# Patient Record
Sex: Male | Born: 1950
Health system: Southern US, Community
[De-identification: ages and names within clinical notes are randomized; demographics above are authoritative.]

## PROBLEM LIST (undated history)

## (undated) DIAGNOSIS — R011 Cardiac murmur, unspecified: Secondary | ICD-10-CM

## (undated) DIAGNOSIS — E785 Hyperlipidemia, unspecified: Secondary | ICD-10-CM

## (undated) DIAGNOSIS — C61 Malignant neoplasm of prostate: Secondary | ICD-10-CM

## (undated) DIAGNOSIS — I499 Cardiac arrhythmia, unspecified: Secondary | ICD-10-CM

## (undated) DIAGNOSIS — K222 Esophageal obstruction: Secondary | ICD-10-CM

## (undated) DIAGNOSIS — K219 Gastro-esophageal reflux disease without esophagitis: Secondary | ICD-10-CM

## (undated) DIAGNOSIS — I639 Cerebral infarction, unspecified: Secondary | ICD-10-CM

## (undated) DIAGNOSIS — F17200 Nicotine dependence, unspecified, uncomplicated: Secondary | ICD-10-CM

## (undated) DIAGNOSIS — R911 Solitary pulmonary nodule: Secondary | ICD-10-CM

## (undated) DIAGNOSIS — Z951 Presence of aortocoronary bypass graft: Secondary | ICD-10-CM

## (undated) DIAGNOSIS — L989 Disorder of the skin and subcutaneous tissue, unspecified: Secondary | ICD-10-CM

## (undated) DIAGNOSIS — Z950 Presence of cardiac pacemaker: Secondary | ICD-10-CM

## (undated) DIAGNOSIS — C44611 Basal cell carcinoma of skin of unspecified upper limb, including shoulder: Secondary | ICD-10-CM

## (undated) DIAGNOSIS — I77810 Thoracic aortic ectasia: Secondary | ICD-10-CM

## (undated) DIAGNOSIS — I2510 Atherosclerotic heart disease of native coronary artery without angina pectoris: Secondary | ICD-10-CM

## (undated) DIAGNOSIS — R001 Bradycardia, unspecified: Secondary | ICD-10-CM

## (undated) DIAGNOSIS — E669 Obesity, unspecified: Secondary | ICD-10-CM

## (undated) DIAGNOSIS — Z8774 Personal history of (corrected) congenital malformations of heart and circulatory system: Secondary | ICD-10-CM

## (undated) DIAGNOSIS — Z953 Presence of xenogenic heart valve: Secondary | ICD-10-CM

## (undated) HISTORY — DX: Basal cell carcinoma of skin of unspecified upper limb, including shoulder: C44.611

## (undated) HISTORY — DX: Atherosclerotic heart disease of native coronary artery without angina pectoris: I25.10

## (undated) HISTORY — DX: Obesity, unspecified: E66.9

## (undated) HISTORY — DX: Esophageal obstruction: K22.2

## (undated) HISTORY — DX: Hyperlipidemia, unspecified: E78.5

## (undated) HISTORY — DX: Nicotine dependence, unspecified, uncomplicated: F17.200

## (undated) HISTORY — DX: Bradycardia, unspecified: R00.1

## (undated) HISTORY — DX: Thoracic aortic ectasia: I77.810

## (undated) HISTORY — DX: Presence of cardiac pacemaker: Z95.0

## (undated) HISTORY — PX: COLONOSCOPY: SHX174

---
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
## 1974-02-08 HISTORY — PX: THYMECTOMY: SHX1063

## 2002-08-06 ENCOUNTER — Ambulatory Visit (HOSPITAL_COMMUNITY): Admission: RE | Admit: 2002-08-06 | Discharge: 2002-08-06 | Payer: Self-pay | Admitting: Gastroenterology

## 2002-09-05 ENCOUNTER — Ambulatory Visit (HOSPITAL_COMMUNITY): Admission: RE | Admit: 2002-09-05 | Discharge: 2002-09-05 | Payer: Self-pay | Admitting: Gastroenterology

## 2007-02-22 ENCOUNTER — Ambulatory Visit: Payer: Self-pay | Admitting: Family Medicine

## 2008-03-04 ENCOUNTER — Ambulatory Visit: Payer: Self-pay | Admitting: Family Medicine

## 2008-03-08 ENCOUNTER — Ambulatory Visit: Payer: Self-pay | Admitting: Family Medicine

## 2008-03-18 ENCOUNTER — Ambulatory Visit: Payer: Self-pay | Admitting: Family Medicine

## 2009-07-28 ENCOUNTER — Ambulatory Visit: Payer: Self-pay | Admitting: Family Medicine

## 2010-06-26 NOTE — Op Note (Signed)
   [name], [name]                            ACCOUNT NO.:  [id number]   MEDICAL RECORD NO.:  [id number]                   PATIENT TYPE:  AMB   LOCATION:  ENDO                                 FACILITY:  [facility]   PHYSICIAN:  John C. Madilyn Fireman, M.D.                 DATE OF BIRTH:  30-Jul-1950   DATE OF PROCEDURE:  08/06/2002  DATE OF DISCHARGE:                                 OPERATIVE REPORT   PROCEDURE PERFORMED:  Colonoscopy.   INDICATIONS FOR PROCEDURE:  Family history of colon cancer in first degree  relative.   DESCRIPTION OF PROCEDURE:  The patient was placed in the left lateral  decubitus position and placed on the pulse monitor with continuous low flow  oxygen delivered by nasal cannula.  He was sedated with 25 mg of IV fentanyl  and 3 mg of IV Versed in addition to the medicine given for the previous  EGD.  The Olympus video colonoscope was inserted into the rectum and  advanced to the cecum, confirmed by transillumination of McBurney's point  and visualization of the ileocecal valve and appendiceal orifice.  The prep  was excellent.  The cecum, ascending, transverse, descending and sigmoid  colon all appeared normal with no masses, polyps, diverticula or other  mucosal abnormalities.  The rectum likewise appeared normal and a  retroflexed view of the anus revealed no obvious internal hemorrhoids.  The  scope was then withdrawn and the patient returned to the recovery room in  stable condition.  He tolerated the procedure well and there were no  immediate complications.   IMPRESSION:  Basically normal colonoscopy.   PLAN:  Repeat colonoscopy in five years based on his family history.                                                John C. Madilyn Fireman, M.D.    JCH/MEDQ  D:  08/06/2002  T:  08/06/2002  Job:  [id number]   cc:   Ricky Ala, M.D.

## 2010-06-26 NOTE — Op Note (Signed)
NAME:  [name], [name]                          ACCOUNT NO.:  [id number]   MEDICAL RECORD NO.:  [id number]                   PATIENT TYPE:  AMB   LOCATION:  ENDO                                 FACILITY:  [facility]   PHYSICIAN:  John C. Madilyn Fireman, M.D.                 DATE OF BIRTH:  Mar 21, 1950   DATE OF PROCEDURE:  09/05/2002  DATE OF DISCHARGE:                                 OPERATIVE REPORT   PROCEDURE:  Esophagogastroduodenoscopy with esophageal dilatation.   INDICATION FOR PROCEDURE:  Recent EGD showing lower esophageal ring or  stricture with significant esophagitis. He presented with dysphagia and felt  that there was too much esophagitis at the time today to perform the  dilation.  He was been treated with proton pump inhibitor for approximately  two months and presents for dilatation.   DESCRIPTION OF PROCEDURE:  The patient was placed in the left lateral  decubitus position and placed on the pulse monitor with continuous low-flow  oxygen delivered by nasal cannula.  He was sedated with 75 mcg IV fentanyl  and 6 mg IV Versed.  The Olympus video endoscope was advanced under direct  vision into the oropharynx and esophagus.  The esophagus was straight and of  normal caliber with the squamocolumnar line imbedded in a patent lower  esophageal ring at approximately 36 cm.  There was a 3 cm hiatal hernia  distal to the ring. There was no visible esophagitis and no evidence of  Barrett's esophagus.  The stomach was entered, and a small amount of liquid  secretions were suctioned from the fundus.  Retroflexed view of the cardia  confirmed the hiatal hernia and was otherwise unremarkable.  The fundus,  body, antrum, and pylorus all appeared normal.  The duodenum was entered,  and both the bulb and second portion were well-inspected and appeared to be  within normal limits.  A Savary guidewire was placed through the endoscope  channel and the scope withdrawn.  Savary dilators of 17 and 18  mm were  passed consecutively with mild resistance over the guidewire and a small  amount of blood seen on the last dilator only.  The last dilator was removed  together with the wire, and the patient returned to the recovery room in  stable condition.  He tolerated the procedure well, and there were no  immediate complications.   IMPRESSION:  A lower esophageal ring with hiatal hernia, status post  dilatation to 18 mm.   PLAN:  1. Advance diet and observe response to dilatation.  2. We will continue proton pump inhibitor.                                               John C. Madilyn Fireman, M.D.    JCH/MEDQ  D:  09/05/2002  T:  09/05/2002  Job:  [id number]   cc:   Sharlot Gowda, M.D.  [place] [address] [place], [place] [address]  Fax: [phone]

## 2010-06-26 NOTE — Op Note (Signed)
   NAMEDAANISH, COPES                            ACCOUNT NO.:  [id number]   MEDICAL RECORD NO.:  [id number]                   PATIENT TYPE:  AMB   LOCATION:  ENDO                                 FACILITY:  [facility]   PHYSICIAN:  John C. Madilyn Fireman, M.D.                 DATE OF BIRTH:  23-Dec-1950   DATE OF PROCEDURE:  DATE OF DISCHARGE:                                 OPERATIVE REPORT   He says to cancel this dictation.                                               John C. Madilyn Fireman, M.D.    JCH/MEDQ  D:  08/06/2002  T:  08/06/2002  Job:  [id number]

## 2010-06-26 NOTE — Op Note (Signed)
[name], [name]                            ACCOUNT NO.:  [id number]   MEDICAL RECORD NO.:  [id number]                   PATIENT TYPE:  AMB   LOCATION:  ENDO                                 FACILITY:  [facility]   PHYSICIAN:  John C. Madilyn Fireman, M.D.                 DATE OF BIRTH:  12-25-1950   DATE OF PROCEDURE:  08/06/2002  DATE OF DISCHARGE:                                 OPERATIVE REPORT   PROCEDURE:  Esophagogastroduodenoscopy.   INDICATIONS FOR PROCEDURE:  Solid food dysphagia in a patient also  undergoing colonoscopy today, due to a family history of colon cancer.   DESCRIPTION OF PROCEDURE:  The patient was placed in the left lateral  decubitus position and placed on the pulse monitor, with continuous low-flow  oxygen delivered by nasal cannula.  He was sedated with 50 mcg IV fentanyl  and  5 mg IV Versed.  The Olympus video endoscope was advanced under direct  vision into the oropharynx and esophagus.  The esophagus was straight and of  normal caliber, with the squamocolumnar line very edematous and imbedded  with a friable stricture.  This was bleeding and the scope is advanced into  it.  There appeared to be several erosions extending upwards from the Z-line  and the stricture was narrow enough that passing the scope through it  induced bleeding.  There were no ulcers seen.  No obvious Barrett's  esophagus.  There was a 3 cm hiatal hernia distal to the stricture.   The stomach was entered and a small amount of liquid secretions were  suctioned from the fundus.  Retroflexed view of the cardia confirmed hiatal hernia, but was otherwise  unremarkable.  The fundus body, antrum and pylorus all appeared normal.  The  duodenum was entered and both the bulb and second portion were well  inspected, appeared to be within normal limits.  Due to the friability of  the stricture, I elected not to dilate it at this time.  The scope was then  withdrawn and the patient returned to the  recovery room in stable condition.  He tolerated the procedure well and there were no immediate complications.   IMPRESSION:  Friable distal esophageal stricture with hiatal hernia.   PLAN:  Treat with a double dose of proton pump inhibitor for four to six  weeks, and then repeat EGD with esophageal dilatation electively.                                               John C. Madilyn Fireman, M.D.    JCH/MEDQ  D:  08/06/2002  T:  08/06/2002  Job:  [id number]   cc:   Sharlot Gowda, M.D.  [address] [place], [place] [address]  Fax: [phone]

## 2010-10-20 ENCOUNTER — Encounter: Payer: Self-pay | Admitting: Family Medicine

## 2010-10-23 ENCOUNTER — Encounter: Payer: Self-pay | Admitting: Family Medicine

## 2010-10-23 ENCOUNTER — Ambulatory Visit (INDEPENDENT_AMBULATORY_CARE_PROVIDER_SITE_OTHER): Payer: BC Managed Care – PPO | Admitting: Family Medicine

## 2010-10-23 DIAGNOSIS — E669 Obesity, unspecified: Secondary | ICD-10-CM

## 2010-10-23 DIAGNOSIS — R011 Cardiac murmur, unspecified: Secondary | ICD-10-CM

## 2010-10-23 DIAGNOSIS — K4090 Unilateral inguinal hernia, without obstruction or gangrene, not specified as recurrent: Secondary | ICD-10-CM

## 2010-10-23 DIAGNOSIS — Z800 Family history of malignant neoplasm of digestive organs: Secondary | ICD-10-CM

## 2010-10-23 DIAGNOSIS — E785 Hyperlipidemia, unspecified: Secondary | ICD-10-CM

## 2010-10-23 DIAGNOSIS — Z0000 Encounter for general adult medical examination without abnormal findings: Secondary | ICD-10-CM

## 2010-10-23 DIAGNOSIS — Z2911 Encounter for prophylactic immunotherapy for respiratory syncytial virus (RSV): Secondary | ICD-10-CM

## 2010-10-23 DIAGNOSIS — Z23 Encounter for immunization: Secondary | ICD-10-CM

## 2010-10-23 DIAGNOSIS — Z79899 Other long term (current) drug therapy: Secondary | ICD-10-CM

## 2010-10-23 LAB — LIPID PANEL
Cholesterol: 202 mg/dL — ABNORMAL HIGH (ref 0–200)
Total CHOL/HDL Ratio: 3.9 Ratio
Triglycerides: 97 mg/dL (ref <150)
VLDL: 19 mg/dL (ref 0–40)

## 2010-10-23 LAB — POCT URINALYSIS DIPSTICK
Bilirubin, UA: NEGATIVE
Ketones, UA: NEGATIVE
Leukocytes, UA: NEGATIVE
Spec Grav, UA: 1.025

## 2010-10-23 LAB — COMPREHENSIVE METABOLIC PANEL
CO2: 20 mEq/L (ref 19–32)
Creat: 0.89 mg/dL (ref 0.50–1.35)
Glucose, Bld: 85 mg/dL (ref 70–99)
Total Bilirubin: 0.5 mg/dL (ref 0.3–1.2)
Total Protein: 7 g/dL (ref 6.0–8.3)

## 2010-10-23 NOTE — Progress Notes (Signed)
Subjective:    Patient ID: [name], male    DOB: Sep 20, 1950, 60 y.o.   MRN: [id number]  HPI A complete examination. He does have reflux disease and takes Prilosec every 3 or 4 days. There is a family history of colon cancer and he had a colonoscopy in 2010. He intermittently takes vitamins. He continues to smoke. He has had no trouble swallowing. He has noted swelling in the left inguinal area. His work and marriage are going well. Family and social history were reviewed.   Review of Systems  Constitutional: Negative.   HENT: Negative.   Eyes: Negative.   Respiratory: Negative.   Cardiovascular: Negative.   Gastrointestinal: Negative.   Genitourinary: Negative.   Musculoskeletal: Negative.   Skin: Negative.   Neurological: Negative.   Hematological: Negative.   Psychiatric/Behavioral: Negative.        Objective:   Physical Exam BP 138/82  Pulse 88  Ht 5\' 8"  (1.727 m)  Wt 233 lb (105.688 kg)  BMI 35.43 kg/m2  General Appearance:    Alert, cooperative, no distress, appears stated age  Head:    Normocephalic, without obvious abnormality, atraumatic  Eyes:    PERRL, conjunctiva/corneas clear, EOM's intact, fundi    benign  Ears:    Normal TM's and external ear canals  Nose:   Nares normal, mucosa normal, no drainage or sinus   tenderness  Throat:   Lips, mucosa, and tongue normal; teeth and gums normal  Neck:   Supple, no lymphadenopathy;  thyroid:  no   enlargement/tenderness/nodules; no carotid   bruit or JVD  Back:    Spine nontender, no curvature, ROM normal, no CVA     tenderness  Lungs:     Clear to auscultation bilaterally without wheezes, rales or     ronchi; respirations unlabored  Chest Wall:    No tenderness or deformity   Heart:    Regular rate and rhythm, S1 and S2 normal, 2/6 systolic murmur heard best along the right sternal border,no  rub   or gallop  Breast Exam:    No chest wall tenderness, masses or gynecomastia  Abdomen:     Soft, non-tender,  nondistended, normoactive bowel sounds,    no masses, no hepatosplenomegaly  Genitalia:    Normal male external genitalia without lesions.  Testicles without masses.  Large inguinal hernia noted on the left       Extremities:   No clubbing, cyanosis or edema  Pulses:   2+ and symmetric all extremities  Skin:   Skin color, texture, turgor normal, no rashes or lesions  Lymph nodes:   Cervical, supraclavicular, and axillary nodes normal  Neurologic:   CNII-XII intact, normal strength, sensation and gait; reflexes 2+ and symmetric throughout          Psych:   Normal mood, affect, hygiene and grooming.          Assessment & Plan:   1. Encounter for long-term (current) use of other medications  POCT Urinalysis Dipstick, CBC w/Diff, Comprehensive metabolic panel, Lipid panel  2. Need for influenza vaccination    3. Left inguinal hernia  Ambulatory referral to General Surgery  4. Obesity (BMI 30-39.9)    5. Family history of colon cancer    6. Hyperlipidemia LDL goal < 100    7. Heart murmur previously undiagnosed  2D Echocardiogram without contrast  8. Routine general medical examination at a health care facility  CBC w/Diff, Comprehensive metabolic panel, Lipid panel   Increasing  to continue on working on smoking cessation. I will refer to Gen. surgery for the hernia and get an echocardiogram.

## 2010-10-24 LAB — CBC WITH DIFFERENTIAL/PLATELET
Basophils Absolute: 0 10*3/uL (ref 0.0–0.1)
Eosinophils Relative: 2 % (ref 0–5)
HCT: 44.1 % (ref 39.0–52.0)
Hemoglobin: 14.8 g/dL (ref 13.0–17.0)
Lymphocytes Relative: 22 % (ref 12–46)
Lymphs Abs: 1.7 10*3/uL (ref 0.7–4.0)
MCV: 88.6 fL (ref 78.0–100.0)
Monocytes Absolute: 0.8 10*3/uL (ref 0.1–1.0)
Monocytes Relative: 10 % (ref 3–12)
Neutro Abs: 5 10*3/uL (ref 1.7–7.7)
RBC: 4.98 MIL/uL (ref 4.22–5.81)
WBC: 7.7 10*3/uL (ref 4.0–10.5)

## 2010-10-26 ENCOUNTER — Telehealth: Payer: Self-pay

## 2010-10-26 NOTE — Telephone Encounter (Signed)
Called to inform pt labs look good lkeft message

## 2010-11-04 ENCOUNTER — Encounter (INDEPENDENT_AMBULATORY_CARE_PROVIDER_SITE_OTHER): Payer: Self-pay | Admitting: Surgery

## 2010-11-04 ENCOUNTER — Ambulatory Visit (INDEPENDENT_AMBULATORY_CARE_PROVIDER_SITE_OTHER): Payer: BC Managed Care – PPO | Admitting: Surgery

## 2010-11-04 VITALS — BP 128/88 | HR 92 | Temp 98.0°F | Resp 12 | Ht 70.0 in | Wt 232.6 lb

## 2010-11-04 DIAGNOSIS — K4020 Bilateral inguinal hernia, without obstruction or gangrene, not specified as recurrent: Secondary | ICD-10-CM | POA: Insufficient documentation

## 2010-11-04 NOTE — Progress Notes (Signed)
Chief Complaint  Patient presents with  . Other    np- eval [facility]    HPI [name] is a 60 y.o. male who presents with several years of some discomfort in his left groin. This tends to occur with prolonged standing with work. About one month ago he started noticing a bulge in his left groin. This has become larger and more uncomfortable. He continues to have daily bowel movements but has noticed that his bowel movements are slightly smaller. The discomfort radiates down to his left scrotum. He denies any symptoms on the right. HPI  Past Medical History  Diagnosis Date  . Smoker   . Obesity   . Dyslipidemia   . Esophageal stricture   . BCE (basal cell epithelioma), arm     RIGHT SHOULDER  . Hyperlipidemia     Past Surgical History  Procedure Date  . Thymectomy 1976  via L thoracotomy  Family History  Problem Relation Age of Onset  . Cancer Mother 70    colon  . Cancer Father 80    lung    Social History History  Substance Use Topics  . Smoking status: Current Everyday Smoker -- 0.5 packs/day for 30 years    Types: Cigarettes  . Smokeless tobacco: Never Used  . Alcohol Use: 0.6 oz/week    1 Cans of beer per week    No Known Allergies  Current Outpatient Prescriptions  Medication Sig Dispense Refill  . cholecalciferol (VITAMIN D) 1000 UNITS tablet Take 1,000 Units by mouth daily.        Marland Kitchen omeprazole (PRILOSEC) 10 MG capsule Take 10 mg by mouth daily.          Review of Systems Review of Systems ROS positive for nasal congestion, slight change in bowel movements. Blood pressure 128/88, pulse 92, temperature 98 F (36.7 C), temperature source Temporal, resp. rate 12, height 5\' 10"  (1.778 m), weight 232 lb 9.6 oz (105.507 kg).  Physical Exam Physical Exam WDWN in NAD HEENT:  EOMI, sclera anicteric Neck:  No masses, no thyromegaly Lungs:  CTA bilaterally; normal respiratory effort CV:  Regular rate and rhythm; no murmurs Abd:  +bowel sounds, soft,  non-tender, no masses GU:  Abundant suprapubic adipose tissue; visible bulge in left groin - enlarges with Valsalva, partially reducible; right groin - spontaneously reducible hernia Ext:  Well-perfused; no edema Skin:  Warm, dry; no sign of jaundice  Data Reviewed None  Assessment    Bilateral inguinal hernias - partially reducible on left; reducible on right    Plan    Open bilateral inguinal hernia repairs with mesh.   I described the procedure in detail.  The patient was given educational material. We discussed the risks and benefits including but not limited to bleeding, infection, chronic inguinal pain, nerve entrapment, hernia recurrence, mesh complications, hematoma formation, urinary retention, injury to the testicles, numbness in the groin, blood clots, injury to the surrounding structures, and anesthesia risk. We also discussed the typical post operative recovery course, including no heavy lifting for 6 weeks.     Tkeyah Burkman K. 11/04/2010, 3:36 PM

## 2010-11-04 NOTE — Patient Instructions (Signed)
We will schedule your surgery today. 

## 2010-12-11 DIAGNOSIS — K4020 Bilateral inguinal hernia, without obstruction or gangrene, not specified as recurrent: Secondary | ICD-10-CM

## 2010-12-11 HISTORY — PX: HERNIA REPAIR: SHX51

## 2010-12-15 ENCOUNTER — Encounter (INDEPENDENT_AMBULATORY_CARE_PROVIDER_SITE_OTHER): Payer: Self-pay | Admitting: Surgery

## 2010-12-25 ENCOUNTER — Ambulatory Visit (INDEPENDENT_AMBULATORY_CARE_PROVIDER_SITE_OTHER): Payer: BC Managed Care – PPO | Admitting: Surgery

## 2010-12-25 ENCOUNTER — Encounter (INDEPENDENT_AMBULATORY_CARE_PROVIDER_SITE_OTHER): Payer: Self-pay | Admitting: Surgery

## 2010-12-25 VITALS — BP 128/86 | HR 88 | Temp 98.2°F | Resp 16 | Ht 70.0 in | Wt 236.2 lb

## 2010-12-25 DIAGNOSIS — K4020 Bilateral inguinal hernia, without obstruction or gangrene, not specified as recurrent: Secondary | ICD-10-CM

## 2010-12-25 NOTE — Patient Instructions (Signed)
Slowly increase your level of activity over the next couple of weeks.  You may resume full activity in mid-December.

## 2010-12-25 NOTE — Progress Notes (Signed)
S/p Open bilateral inguinal hernia repairs with mesh on November 2. He had bilateral pantaloon hernias repaired with mesh. He is doing quite well. Minimal swelling. His incisions are healed no sign of infection. No sign of recurrent hernia. No hematoma or seroma. He may slowly begin increasing his level of activity and may resume full activity in mid-December. Followup p.r.n.

## 2011-12-17 ENCOUNTER — Encounter: Payer: Self-pay | Admitting: Internal Medicine

## 2011-12-27 ENCOUNTER — Ambulatory Visit (INDEPENDENT_AMBULATORY_CARE_PROVIDER_SITE_OTHER): Payer: BC Managed Care – PPO | Admitting: Family Medicine

## 2011-12-27 ENCOUNTER — Encounter: Payer: Self-pay | Admitting: Family Medicine

## 2011-12-27 VITALS — BP 126/80 | HR 86 | Ht 68.75 in | Wt 232.0 lb

## 2011-12-27 DIAGNOSIS — Z0000 Encounter for general adult medical examination without abnormal findings: Secondary | ICD-10-CM

## 2011-12-27 DIAGNOSIS — Z23 Encounter for immunization: Secondary | ICD-10-CM

## 2011-12-27 DIAGNOSIS — Z720 Tobacco use: Secondary | ICD-10-CM | POA: Insufficient documentation

## 2011-12-27 DIAGNOSIS — E669 Obesity, unspecified: Secondary | ICD-10-CM | POA: Insufficient documentation

## 2011-12-27 DIAGNOSIS — Z8719 Personal history of other diseases of the digestive system: Secondary | ICD-10-CM

## 2011-12-27 DIAGNOSIS — I350 Nonrheumatic aortic (valve) stenosis: Secondary | ICD-10-CM | POA: Insufficient documentation

## 2011-12-27 DIAGNOSIS — R011 Cardiac murmur, unspecified: Secondary | ICD-10-CM

## 2011-12-27 DIAGNOSIS — F17200 Nicotine dependence, unspecified, uncomplicated: Secondary | ICD-10-CM

## 2011-12-27 LAB — HEMOCCULT GUIAC POC 1CARD (OFFICE)

## 2011-12-27 NOTE — Progress Notes (Signed)
Subjective:    Patient ID: [name], male    DOB: 12-29-1950, 61 y.o.   MRN: [id number]  HPI He is here for complete examination. He does smoke but is not interested in quitting at this time. He does have a previous history of esophageal stricture but is having no difficulty with it at this time. He does drink occasionally. His marriage is going well as is his work. Social and personal history was reviewed. The record indicates he did have a murmur on his last visit and was scheduled for an echocardiogram however he did not get it done.  Review of Systems  Constitutional: Negative.   HENT: Negative.   Eyes: Negative.   Cardiovascular: Negative.   Gastrointestinal: Negative.   Genitourinary: Negative.   Musculoskeletal: Negative.   Neurological: Negative.   Hematological: Negative.        Objective:   Physical Exam BP 126/80  Pulse 86  Ht 5' 8.75" (1.746 m)  Wt 232 lb (105.235 kg)  BMI 34.51 kg/m2  General Appearance:    Alert, cooperative, no distress, appears stated age  Head:    Normocephalic, without obvious abnormality, atraumatic  Eyes:    PERRL, conjunctiva/corneas clear, EOM's intact, fundi    benign  Ears:    Normal TM's and external ear canals  Nose:   Nares normal, mucosa normal, no drainage or sinus   tenderness  Throat:   Lips, mucosa, and tongue normal; teeth and gums normal  Neck:   Supple, no lymphadenopathy;  thyroid:  no   enlargement/tenderness/nodules; no carotid   bruit or JVD  Back:    Spine nontender, no curvature, ROM normal, no CVA     tenderness  Lungs:     Clear to auscultation bilaterally without wheezes, rales or     ronchi; respirations unlabored  Chest Wall:    No tenderness or deformity   Heart:    Regular rate and rhythm, S1 and S2 normal, 3/6 SEM heard best in the aortic area, no rub   or gallop.  Breast Exam:    No chest wall tenderness, masses or gynecomastia  Abdomen:     Soft, non-tender, nondistended, normoactive bowel sounds,   no masses, no hepatosplenomegaly  Genitalia:   deferred   Rectal:    Normal sphincter tone, no masses or tenderness; guaiac negative stool.  Prostate smooth, no nodules, not enlarged.  Extremities:   No clubbing, cyanosis or edema  Pulses:   2+ and symmetric all extremities  Skin:   Skin color, texture, turgor normal, no rashes or lesions  Lymph nodes:   Cervical, supraclavicular, and axillary nodes normal  Neurologic:   CNII-XII intact, normal strength, sensation and gait; reflexes 2+ and symmetric throughout          Psych:   Normal mood, affect, hygiene and grooming.    EKG showed no acute changes       Assessment & Plan:   1. Routine general medical examination at a health care facility  Tdap vaccine greater than or equal to 7yo IM, Flu vaccine greater than or equal to 3yo preservative free IM, Hemoccult - 1 Card (office), Lipid panel, CBC with Differential, Comprehensive metabolic panel  2. Current smoker    3. Obesity (BMI 30-39.9)  Lipid panel, CBC with Differential, Comprehensive metabolic panel  4. Systolic murmur  2D Echocardiogram without contrast, PR ELECTROCARDIOGRAM, COMPLETE  5. History of esophageal stricture     he is not interested in quitting smoking at this  time. Also discussed the need for him to make diet and exercise changes. His immunizations were updated. Risks and benefits were discussed.

## 2011-12-28 LAB — COMPREHENSIVE METABOLIC PANEL
ALT: 12 U/L (ref 0–53)
BUN: 15 mg/dL (ref 6–23)
CO2: 29 mEq/L (ref 19–32)
Calcium: 9.5 mg/dL (ref 8.4–10.5)
Chloride: 106 mEq/L (ref 96–112)
Creat: 0.87 mg/dL (ref 0.50–1.35)
Glucose, Bld: 92 mg/dL (ref 70–99)
Total Bilirubin: 0.5 mg/dL (ref 0.3–1.2)

## 2011-12-28 LAB — CBC WITH DIFFERENTIAL/PLATELET
Eosinophils Absolute: 0.3 10*3/uL (ref 0.0–0.7)
Eosinophils Relative: 4 % (ref 0–5)
HCT: 45.1 % (ref 39.0–52.0)
Lymphs Abs: 1.6 10*3/uL (ref 0.7–4.0)
MCH: 29.2 pg (ref 26.0–34.0)
MCV: 87.9 fL (ref 78.0–100.0)
Monocytes Absolute: 0.6 10*3/uL (ref 0.1–1.0)
Platelets: 219 10*3/uL (ref 150–400)
RBC: 5.13 MIL/uL (ref 4.22–5.81)

## 2011-12-28 LAB — LIPID PANEL
Cholesterol: 217 mg/dL — ABNORMAL HIGH (ref 0–200)
Total CHOL/HDL Ratio: 3.8 Ratio

## 2011-12-28 NOTE — Progress Notes (Signed)
Quick Note:  Called pt cell # pt advised blood work unchanged and he verbalized understanding ______

## 2012-01-21 ENCOUNTER — Telehealth: Payer: Self-pay | Admitting: Family Medicine

## 2012-01-21 NOTE — Telephone Encounter (Signed)
SENT TO [facility]

## 2012-01-21 NOTE — Telephone Encounter (Signed)
CALLED PT HOME LEFT MESSAGE THAT IT WAS MY FAULT AND I APPOLIGIZE FOR THE INCONVENIENCE AND FAXED REQUEST TO [facility].

## 2012-01-25 ENCOUNTER — Other Ambulatory Visit (HOSPITAL_COMMUNITY): Payer: Self-pay | Admitting: Family Medicine

## 2012-01-25 DIAGNOSIS — R011 Cardiac murmur, unspecified: Secondary | ICD-10-CM

## 2012-02-07 ENCOUNTER — Ambulatory Visit (HOSPITAL_COMMUNITY)
Admission: RE | Admit: 2012-02-07 | Discharge: 2012-02-07 | Disposition: A | Discharge status: Home / Self Care | Payer: BC Managed Care – PPO | Source: Ambulatory Visit | Attending: Cardiovascular Disease | Admitting: Cardiovascular Disease

## 2012-02-07 DIAGNOSIS — R011 Cardiac murmur, unspecified: Secondary | ICD-10-CM | POA: Insufficient documentation

## 2012-02-07 DIAGNOSIS — F17200 Nicotine dependence, unspecified, uncomplicated: Secondary | ICD-10-CM | POA: Insufficient documentation

## 2012-02-07 DIAGNOSIS — E669 Obesity, unspecified: Secondary | ICD-10-CM | POA: Insufficient documentation

## 2012-02-07 DIAGNOSIS — I080 Rheumatic disorders of both mitral and aortic valves: Secondary | ICD-10-CM | POA: Insufficient documentation

## 2012-02-07 NOTE — Progress Notes (Signed)
[facility]   2D echo completed 02/07/2012.   Cindy Anise Harbin, RDCS   

## 2012-02-10 NOTE — Progress Notes (Signed)
Quick Note:  I discussed the results with the patient. Appt with Dr Patty Sermons. ______

## 2012-02-11 ENCOUNTER — Telehealth: Payer: Self-pay

## 2012-02-11 NOTE — Telephone Encounter (Signed)
PT HAS BEEN CALLED AND INFORMED OF HIS APT WITH [facility] Monday JAN. 6TH WITH DR [facility] [place] AT 8:45 am [phone]

## 2012-02-14 ENCOUNTER — Encounter: Payer: Self-pay | Admitting: Cardiovascular Disease

## 2012-02-14 ENCOUNTER — Ambulatory Visit (INDEPENDENT_AMBULATORY_CARE_PROVIDER_SITE_OTHER): Payer: Managed Care, Other (non HMO) | Admitting: Cardiovascular Disease

## 2012-02-14 VITALS — BP 122/74 | Ht 69.0 in | Wt 241.0 lb

## 2012-02-14 DIAGNOSIS — I350 Nonrheumatic aortic (valve) stenosis: Secondary | ICD-10-CM

## 2012-02-14 DIAGNOSIS — I359 Nonrheumatic aortic valve disorder, unspecified: Secondary | ICD-10-CM

## 2012-02-14 NOTE — Patient Instructions (Addendum)
Your physician wants you to follow-up in:  6 months.  You will receive a reminder letter in the mail two months in advance. If you don't receive a letter, please call our office to schedule the follow-up appointment.  Your physician has requested that you have an echocardiogram. Echocardiography is a painless test that uses sound waves to create images of your heart. It provides your doctor with information about the size and shape of your heart and how well your heart's chambers and valves are working. This procedure takes approximately one hour. There are no restrictions for this procedure. To be done in 6 months--week or so prior to appt with Dr. McAlhany    

## 2012-02-14 NOTE — Progress Notes (Signed)
History of Present Illness: 62 yo male with history of tobacco abuse, hyperlipidemia, esophageal stricture referred today for evaluation after an abnormal echo. He has been noted in primary care to have a systolic murmur. Echo 01/3012 with moderate aortic valve stenosis and regurgitation and mild to moderate mitral valve stenosis, severe MAC and enlarged left atrium.    He is feeling well. He has no chest pain or SOB. He is trying to stop smoking. No lower extremity edema, orthopnea, PND. Weight is stable.   Primary Care Physician: Sharlot Gowda  Last Lipid Profile:Lipid Panel     Component Value Date/Time   CHOL 217* 12/27/2011 1538   TRIG 99 12/27/2011 1538   HDL 57 12/27/2011 1538   CHOLHDL 3.8 12/27/2011 1538   VLDL 20 12/27/2011 1538   LDLCALC 140* 12/27/2011 1538     Past Medical History  Diagnosis Date  . Smoker   . Obesity   . Dyslipidemia   . Esophageal stricture   . BCE (basal cell epithelioma), arm     RIGHT SHOULDER  . Hyperlipidemia   . Inguinal hernia     bilateral    Past Surgical History  Procedure Date  . Thymectomy 1976  . Hernia repair 12/11/10    BIH  . Colonoscopy 03/2008,2004    Dr. Madilyn Fireman    Current Outpatient Prescriptions  Medication Sig Dispense Refill  . omeprazole (PRILOSEC) 10 MG capsule Take 10 mg by mouth daily. Pt takes one every three days        No Known Allergies  History   Social History  . Marital Status: Married    Spouse Name: N/A    Number of Children: 0  . Years of Education: N/A   Occupational History  . Photographer    Social History Main Topics  . Smoking status: Current Every Day Smoker -- 0.2 packs/day for 35 years    Types: Cigarettes  . Smokeless tobacco: Never Used  . Alcohol Use: 0.6 oz/week    1 Cans of beer per week  . Drug Use: No  . Sexually Active: Yes   Other Topics Concern  . Not on file   Social History Narrative  . No narrative on file    Family History  Problem Relation Age of Onset   . Cancer Mother 7    colon  . Cancer Father 75    lung  . CAD Neg Hx     Review of Systems:  As stated in the HPI and otherwise negative.   BP 122/74  Ht 5\' 9"  (1.753 m)  Wt 241 lb (109.317 kg)  BMI 35.59 kg/m2  Physical Examination: General: Well developed, well nourished, NAD HEENT: OP clear, mucus membranes moist SKIN: warm, dry. No rashes. Neuro: No focal deficits Musculoskeletal: Muscle strength 5/5 all ext Psychiatric: Mood and affect normal Neck: No JVD, no carotid bruits, no thyromegaly, no lymphadenopathy. Lungs:Clear bilaterally, no wheezes, rhonci, crackles Cardiovascular: Regular rate and rhythm. II/VI systolic murmur without gallops or rubs. I cannot appreciate a diastolic murmur.  Abdomen:Soft. Bowel sounds present. Non-tender.  Extremities: No lower extremity edema. Pulses are 1 + in the bilateral DP/PT.  EKG: NSR, rate 99 bpm. PVC. Incomplete RBBB. LAE  Echo 02/07/12: Left ventricle: The cavity size was normal. There was mild concentric hypertrophy. Systolic function was normal. The estimated ejection fraction was in the range of 55% to 60%. Wall motion was normal; there were no regional wall motion abnormalities. Doppler parameters are consistent with elevated mean  left atrial filling pressure. - Aortic valve: There was moderate stenosis. Moderate regurgitation. Valve area: 1.26cm^2(VTI). Valve area: 1.19cm^2 (Vmax). - Mitral valve: Severely calcified annulus. The findings are consistent with mild to moderate stenosis. Valve area by pressure half-time: 2.12cm^2. Valve area by continuity equation (using LVOT flow): 1.56cm^2. - Left atrium: The atrium was severely dilated. - Atrial septum: No defect or patent foramen ovale was identified. - Pulmonary arteries: Systolic pressure was mildly increased. PA peak pressure: 41mm Hg (S).  Assessment and Plan:   1. Aortic stenosis: Moderate by echo with moderate regurgitation 02/07/12. He has no symptoms of  chest pain, SOB, CHF or syncope. Will need repeat echo June 2014.  2. Mitral valve stenosis: Mild to moderate by echo 02/07/12 with severe mitral annular calcification. Repeat echo six months.   3. Tobacco abuse: He is trying to stop.

## 2013-05-14 ENCOUNTER — Encounter: Payer: Self-pay | Admitting: Family Medicine

## 2013-05-18 ENCOUNTER — Encounter: Payer: Self-pay | Admitting: Family Medicine

## 2013-05-18 ENCOUNTER — Ambulatory Visit (INDEPENDENT_AMBULATORY_CARE_PROVIDER_SITE_OTHER): Payer: Managed Care, Other (non HMO) | Admitting: Family Medicine

## 2013-05-18 VITALS — Ht 67.0 in | Wt 236.0 lb

## 2013-05-18 DIAGNOSIS — Z800 Family history of malignant neoplasm of digestive organs: Secondary | ICD-10-CM

## 2013-05-18 DIAGNOSIS — E669 Obesity, unspecified: Secondary | ICD-10-CM

## 2013-05-18 DIAGNOSIS — F17200 Nicotine dependence, unspecified, uncomplicated: Secondary | ICD-10-CM

## 2013-05-18 DIAGNOSIS — Z8719 Personal history of other diseases of the digestive system: Secondary | ICD-10-CM

## 2013-05-18 DIAGNOSIS — K4090 Unilateral inguinal hernia, without obstruction or gangrene, not specified as recurrent: Secondary | ICD-10-CM

## 2013-05-18 DIAGNOSIS — Z0000 Encounter for general adult medical examination without abnormal findings: Secondary | ICD-10-CM

## 2013-05-18 DIAGNOSIS — I350 Nonrheumatic aortic (valve) stenosis: Secondary | ICD-10-CM

## 2013-05-18 DIAGNOSIS — I359 Nonrheumatic aortic valve disorder, unspecified: Secondary | ICD-10-CM

## 2013-05-18 LAB — COMPREHENSIVE METABOLIC PANEL
ALT: 16 U/L (ref 0–53)
AST: 18 U/L (ref 0–37)
Albumin: 4.2 g/dL (ref 3.5–5.2)
Alkaline Phosphatase: 57 U/L (ref 39–117)
BILIRUBIN TOTAL: 0.7 mg/dL (ref 0.2–1.2)
BUN: 13 mg/dL (ref 6–23)
CO2: 24 meq/L (ref 19–32)
CREATININE: 0.87 mg/dL (ref 0.50–1.35)
Calcium: 9.6 mg/dL (ref 8.4–10.5)
Chloride: 104 mEq/L (ref 96–112)
Glucose, Bld: 88 mg/dL (ref 70–99)
Potassium: 4.4 mEq/L (ref 3.5–5.3)
Sodium: 139 mEq/L (ref 135–145)
Total Protein: 7.1 g/dL (ref 6.0–8.3)

## 2013-05-18 LAB — CBC WITH DIFFERENTIAL/PLATELET
Basophils Absolute: 0 10*3/uL (ref 0.0–0.1)
Basophils Relative: 0 % (ref 0–1)
EOS ABS: 0.3 10*3/uL (ref 0.0–0.7)
EOS PCT: 4 % (ref 0–5)
HCT: 41.8 % (ref 39.0–52.0)
Hemoglobin: 14.5 g/dL (ref 13.0–17.0)
LYMPHS ABS: 2 10*3/uL (ref 0.7–4.0)
Lymphocytes Relative: 25 % (ref 12–46)
MCH: 29.1 pg (ref 26.0–34.0)
MCHC: 34.7 g/dL (ref 30.0–36.0)
MCV: 83.9 fL (ref 78.0–100.0)
Monocytes Absolute: 0.6 10*3/uL (ref 0.1–1.0)
Monocytes Relative: 8 % (ref 3–12)
Neutro Abs: 5.1 10*3/uL (ref 1.7–7.7)
Neutrophils Relative %: 63 % (ref 43–77)
PLATELETS: 232 10*3/uL (ref 150–400)
RBC: 4.98 MIL/uL (ref 4.22–5.81)
RDW: 14.3 % (ref 11.5–15.5)
WBC: 8.1 10*3/uL (ref 4.0–10.5)

## 2013-05-18 LAB — POCT URINALYSIS DIPSTICK
Bilirubin, UA: NEGATIVE
Glucose, UA: NEGATIVE
Ketones, UA: NEGATIVE
Leukocytes, UA: NEGATIVE
Nitrite, UA: NEGATIVE
PH UA: 7
PROTEIN UA: NEGATIVE
RBC UA: NEGATIVE
UROBILINOGEN UA: NEGATIVE

## 2013-05-18 LAB — LIPID PANEL
CHOL/HDL RATIO: 4 ratio
Cholesterol: 202 mg/dL — ABNORMAL HIGH (ref 0–200)
HDL: 50 mg/dL (ref >39)
LDL CALC: 130 mg/dL — AB (ref 0–99)
Triglycerides: 111 mg/dL (ref <150)
VLDL: 22 mg/dL (ref 0–40)

## 2013-05-18 NOTE — Progress Notes (Signed)
   Subjective:    Patient ID: [name], male    DOB: 07/25/1950, 63 y.o.   MRN: [id number]  HPI He is here for complete examination. He has had some difficulty with swelling in the inguinal area. He has had previous surgery in that area and is concerned over another hernia. He is working on quitting smoking but has not done quite yet. He does have a history of esophageal stricture but has not had any difficulty with swallowing. He will be scheduled for colonoscopy in the near future. He also is not seen his cardiologist for evaluation of his underlying aortic stenosis. Otherwise he has no particular concerns or complaints. Home life is going well. He is now working 3 days per week and seems to be enjoying this.   Review of Systems  All other systems reviewed and are negative.      Objective:   Physical Exam Ht 5\' 7"  (1.702 m)  Wt 236 lb (107.049 kg)  BMI 36.95 kg/m2  General Appearance:    Alert, cooperative, no distress, appears stated age  Head:    Normocephalic, without obvious abnormality, atraumatic  Eyes:    PERRL, conjunctiva/corneas clear, EOM's intact, fundi    benign  Ears:    Normal TM 's and external ear canals  Nose:   Nares normal, mucosa normal, no drainage or sinus   tenderness  Throat:   Lips, mucosa, and tongue normal; teeth and gums normal  Neck:   Supple, no lymphadenopathy;  thyroid:  no   enlargement/tenderness/nodules; no carotid   bruit or JVD  Back:    Spine nontender, no curvature, ROM normal, no CVA     tenderness  Lungs:     Clear to auscultation bilaterally without wheezes, rales or     ronchi; respirations unlabored  Chest Wall:    No tenderness or deformity   Heart:    Regular rate and rhythm, S1 and S2 normal, no murmur, rub   or gallop  Breast Exam:    No chest wall tenderness, masses or gynecomastia  Abdomen:     Soft, non-tender, nondistended, normoactive bowel sounds,    no masses, no hepatosplenomegaly  Genitalia:    Normal male external  genitalia without lesions.  Testicles without masses.  Right inguinal hernia noted.   Rectal:   deferred   Extremities:   No clubbing, cyanosis or edema  Pulses:   2+ and symmetric all extremities  Skin:   Skin color, texture, turgor normal, no rashes or lesions  Lymph nodes:   Cervical, supraclavicular, and axillary nodes normal  Neurologic:   CNII-XII intact, normal strength, sensation and gait; reflexes 2+ and symmetric throughout          Psych:   Normal mood, affect, hygiene and grooming.         Assessment & Plan:  Routine general medical examination at a health care facility - Plan: Urinalysis Dipstick, CBC with Differential, Comprehensive metabolic panel, Lipid panel  Current smoker  Obesity (BMI 30-39.9) - Plan: CBC with Differential, Comprehensive metabolic panel, Lipid panel  Family history of colon cancer  History of esophageal stricture  Right inguinal hernia  Aortic stenosis  he'll schedule to see his cardiologist again. Discussed treatment of the hernia and he will hold off on any further evaluation and treatment of that. Encouraged him to quit smoking. Followup here as needed.

## 2013-06-14 ENCOUNTER — Encounter: Payer: Self-pay | Admitting: Internal Medicine

## 2013-06-14 ENCOUNTER — Encounter: Payer: Self-pay | Admitting: Family Medicine

## 2013-12-17 ENCOUNTER — Encounter: Payer: Self-pay | Admitting: Family Medicine

## 2013-12-17 ENCOUNTER — Ambulatory Visit (INDEPENDENT_AMBULATORY_CARE_PROVIDER_SITE_OTHER): Payer: Managed Care, Other (non HMO) | Admitting: Family Medicine

## 2013-12-17 VITALS — BP 120/76 | HR 118 | Wt 223.0 lb

## 2013-12-17 DIAGNOSIS — K4021 Bilateral inguinal hernia, without obstruction or gangrene, recurrent: Secondary | ICD-10-CM

## 2013-12-17 DIAGNOSIS — Z23 Encounter for immunization: Secondary | ICD-10-CM

## 2013-12-17 NOTE — Progress Notes (Signed)
   Subjective:    Patient ID: [name], male    DOB: [birth year], 63 y.o.   MRN: [id number]  HPI He is here for evaluation of fullness and discomfort in the left inguinal area. He also has a lesser issue on the right. He has had previous bilateral inguinal surgery.   Review of Systems     Objective:   Physical Exam Large hernia present on the left with a smaller one present on the right. Testes are slightly atrophied.       Assessment & Plan:  Need for prophylactic vaccination and inoculation against influenza - Plan: Flu Vaccine QUAD 36+ mos PF IM (Fluarix Quad PF)  Bilateral recurrent inguinal hernia without obstruction or gangrene - Plan: Ambulatory referral to General Surgery

## 2014-02-06 ENCOUNTER — Ambulatory Visit (INDEPENDENT_AMBULATORY_CARE_PROVIDER_SITE_OTHER): Payer: Self-pay | Admitting: Surgery

## 2014-02-06 NOTE — H&P (Signed)
History of Present Illness [name]. Michael Yott MD; 02/06/2014 5:02 PM) Patient words: f/u hernia.  The patient is a 63 year old male who presents with an inguinal hernia. Referred by Dr. Redmond School for bilateral inguinal hernias  This is a 63 year old male who is status post open bilateral inguinal hernia repairs with mesh in November 2012. The patient had very large bilateral pantaloon inguinal hernias with very little remaining muscle in his inguinal floors. These were both repaired with Prolene mesh. The patient did well for about 2 years. Over the last several months he has developed noticeable swelling and discomfort, mostly on his left side. This gets better when he is relaxed. 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 He denies any obstructive symptoms. Other Problems (Michael Eversole, LPN; 04/54/0981 1:91 PM) Gastroesophageal Reflux Disease Heart murmur Inguinal Hernia  Past Surgical History (Michael Eversole, LPN; 47/82/9562 1:30 PM) Colon Polyp Removal - Colonoscopy Laparoscopic Inguinal Hernia Surgery Bilateral.  Diagnostic Studies History (Michael Eversole, LPN; 86/57/8469 6:29 PM) Colonoscopy within last year  Allergies (Michael Eversole, LPN; 52/84/1324 4:01 PM) No Known Drug Allergies 02/06/2014  Medication History (Michael Eversole, LPN; 02/72/5366 4:40 PM) Aspirin EC (81MG  Tablet DR, Oral) Active. PriLOSEC (10MG  Packet, Oral) Active.  Social History (Michael Eversole, LPN; 34/74/2595 6:38 PM) Alcohol use Moderate alcohol use. Caffeine use Carbonated beverages, Coffee, Tea. No drug use Tobacco use Current some day smoker.  Family History Michael Borer, LPN; 75/64/3329 5:18 PM) Colon Cancer Mother. Respiratory Condition Father.     Review of Systems (Michael Eversole LPN; 84/16/6063 0:16 PM) General Not Present- Appetite Loss, Chills, Fatigue, Fever, Night Sweats, Weight Gain and Weight Loss. Skin Not Present- Change in  Wart/Mole, Dryness, Hives, Jaundice, New Lesions, Non-Healing Wounds, Rash and Ulcer. HEENT Not Present- Earache, Hearing Loss, Hoarseness, Nose Bleed, Oral Ulcers, Ringing in the Ears, Seasonal Allergies, Sinus Pain, Sore Throat, Visual Disturbances, Wears glasses/contact lenses and Yellow Eyes. Respiratory Not Present- Bloody sputum, Chronic Cough, Difficulty Breathing, Snoring and Wheezing. Breast Not Present- Breast Mass, Breast Pain, Nipple Discharge and Skin Changes. Cardiovascular Not Present- Chest Pain, Difficulty Breathing Lying Down, Leg Cramps, Palpitations, Rapid Heart Rate, Shortness of Breath and Swelling of Extremities. Gastrointestinal Not Present- Abdominal Pain, Bloating, Bloody Stool, Change in Bowel Habits, Chronic diarrhea, Constipation, Difficulty Swallowing, Excessive gas, Gets full quickly at meals, Hemorrhoids, Indigestion, Nausea, Rectal Pain and Vomiting. Male Genitourinary Not Present- Blood in Urine, Change in Urinary Stream, Frequency, Impotence, Nocturia, Painful Urination, Urgency and Urine Leakage. Musculoskeletal Not Present- Back Pain, Joint Pain, Joint Stiffness, Muscle Pain, Muscle Weakness and Swelling of Extremities. Neurological Not Present- Decreased Memory, Fainting, Headaches, Numbness, Seizures, Tingling, Tremor, Trouble walking and Weakness. Psychiatric Not Present- Anxiety, Bipolar, Change in Sleep Pattern, Depression, Fearful and Frequent crying. Endocrine Not Present- Cold Intolerance, Excessive Hunger, Hair Changes, Heat Intolerance, Hot flashes and New Diabetes. Hematology Not Present- Easy Bruising, Excessive bleeding, Gland problems, HIV and Persistent Infections.  Vitals (Michael Eversole LPN; 02/16/3233 5:73 PM) 02/06/2014 4:19 PM Weight: 229.6 lb Height: 67in Body Surface Area: 2.22 m Body Mass Index: 35.96 kg/m Temp.: 97.45F(Oral)  Pulse: 82 (Regular)  Resp.: 16 (Unlabored)  BP: 142/80 (Sitting, Left Arm,  Standard)     Physical Exam Michael Key K. Jonnae Fonseca MD; 02/06/2014 5:05 PM)  The physical exam findings are as follows: Note:WDWN in NAD HEENT: EOMI, sclera anicteric Neck: No masses, no thyromegaly Lungs: CTA bilaterally; normal respiratory effort CV: Regular rate and rhythm; no murmurs Abd: +bowel sounds, soft,  non-tender, no masses GU: bilateral inguinal incisions well-healed; bilateral inguinal hernias - partially reducible Ext: Well-perfused; no edema Skin: Warm, dry; no sign of jaundice    Assessment & Plan Michael Key K. Kenetra Hildenbrand MD; 02/06/2014 4:38 PM)  BILATERAL RECURRENT INGUINAL HERNIA WITHOUT OBSTRUCTION OR GANGRENE (550.93  K40.21)  Current Plans Schedule for Surgery - Laparoscopic bilateral inguinal hernia repairs with mesh. The surgical procedure has been discussed with the patient. Potential risks, benefits, alternative treatments, and expected outcomes have been explained. All of the patient's questions at this time have been answered. The likelihood of reaching the patient's treatment goal is good. The patient understand the proposed surgical procedure and wishes to proceed.  [name]. Michael Dover, MD, [facility]  General/ Trauma Surgery  02/06/2014 5:07 PM

## 2014-03-18 ENCOUNTER — Encounter: Payer: Self-pay | Admitting: Physician Assistant

## 2014-03-18 ENCOUNTER — Ambulatory Visit (INDEPENDENT_AMBULATORY_CARE_PROVIDER_SITE_OTHER): Payer: Managed Care, Other (non HMO) | Admitting: Physician Assistant

## 2014-03-18 VITALS — BP 128/82 | HR 82 | Ht 67.0 in | Wt 234.2 lb

## 2014-03-18 DIAGNOSIS — I350 Nonrheumatic aortic (valve) stenosis: Secondary | ICD-10-CM

## 2014-03-18 DIAGNOSIS — I050 Rheumatic mitral stenosis: Secondary | ICD-10-CM

## 2014-03-18 DIAGNOSIS — F17200 Nicotine dependence, unspecified, uncomplicated: Secondary | ICD-10-CM

## 2014-03-18 DIAGNOSIS — Z720 Tobacco use: Secondary | ICD-10-CM

## 2014-03-18 DIAGNOSIS — Z01818 Encounter for other preprocedural examination: Secondary | ICD-10-CM | POA: Insufficient documentation

## 2014-03-18 DIAGNOSIS — E669 Obesity, unspecified: Secondary | ICD-10-CM

## 2014-03-18 NOTE — Assessment & Plan Note (Signed)
Weight loss recommended 

## 2014-03-18 NOTE — Assessment & Plan Note (Signed)
Patient had moderate aortic stenosis and mild to moderate mitral stenosis on 2-D echo in 2013. He was supposed to have a follow-up in June 2014 that was never done. He is now here for cardiac clearance prior to undergoing left hernia repair. He is asymptomatic. We will order a 2-D echo to be performed tomorrow prior to clearing him for surgery. Patient has had no change in his past medical history since his last office visit.

## 2014-03-18 NOTE — Progress Notes (Signed)
Cardiology Office Note   Date:  03/18/2014   ID:  Michael, [name] [birth year], MRN [id number]  PCP:  Wyatt Haste, MD  Cardiologist: Dr. Angelena Form  CC: Surgical clearance    History of Present Illness: [name] is a 64 y.o. male who presents for surgical clearance for left hernia repair by Dr. Georgette Dover.   He has a history of Moderate AS and AI Valve area: 1.26cm^2(VTI). Valve area:1.19cm^2 (Vmax), mild to moderate Mitral stenosis on echo 01/28/2012. He was supposed to have f/u echo 07/2012 but it was never done. He last saw Dr. Angelena Form 02/14/2012. He denies any change in his medical history since then. He is trying to quit smoking and is down to 4 cigarettes/daily. He doesn't exercise regulary, but could walk at least a mile without difficulty. He denies hypertension, diabetes mellitus, hyperlipidemia, or family history of heart disease.    Past Medical History  Diagnosis Date  . Smoker   . Obesity   . Dyslipidemia   . Esophageal stricture   . BCE (basal cell epithelioma), arm     RIGHT SHOULDER  . Hyperlipidemia   . Inguinal hernia     bilateral    Past Surgical History  Procedure Laterality Date  . Thymectomy  1976  . Hernia repair  12/11/10    BIH  . Colonoscopy  03/2008,2004    Dr. Amedeo Plenty     Current Outpatient Prescriptions  Medication Sig Dispense Refill  . aspirin 81 MG tablet Take 81 mg by mouth once. Pt takes every other day    . omeprazole (PRILOSEC) 10 MG capsule Take 10 mg by mouth daily. Pt takes one every three days     No current facility-administered medications for this visit.    Allergies:   Review of patient's allergies indicates no known allergies.    Social History:  The patient  reports that he has been smoking Cigarettes.  He has a 8.75 pack-year smoking history. He has never used smokeless tobacco. He reports that he drinks about 0.6 oz of alcohol per week. He reports that he does not use illicit drugs.   Family History:  The  patient's family history includes Cancer (age of onset: 33) in his father; Cancer (age of onset: 80) in his mother. There is no history of CAD.    ROS:  Please see the history of present illness.    All other systems are reviewed and negative.    PHYSICAL EXAM: VS:  BP 128/82 mmHg  Pulse 82  Ht 5\' 7"  (1.702 m)  Wt 234 lb 3.2 oz (106.232 kg)  BMI 36.67 kg/m2 , BMI Body mass index is 36.67 kg/(m^2). GEN: Obese, in no acute distress HEENT: normal Neck: no JVD, carotid bruits, or masses Cardiac:RRR; 7-2/5 harsh systolic murmur at the left sternal border and apex, no rubs, or gallops,no edema  Respiratory:  clear to auscultation bilaterally, normal work of breathing GI: soft, nontender, nondistended, + BS MS: no deformity or atrophy Skin: warm and dry, no rash Neuro:  Strength and sensation are intact Psych: euthymic mood, full affect   EKG:  EKG  ordered today. The ekg ordered today demonstrates normal sinus rhythm with RSR or QR pattern in V1 no acute change from prior tracing   Recent Labs: 05/18/2013: ALT 16; BUN 13; Creatinine 0.87; Hemoglobin 14.5; Platelets 232; Potassium 4.4; Sodium 139    Lipid Panel    Component Value Date/Time   CHOL 202* 05/18/2013 1437   TRIG 111  05/18/2013 1437   HDL 50 05/18/2013 1437   CHOLHDL 4.0 05/18/2013 1437   VLDL 22 05/18/2013 1437   LDLCALC 130* 05/18/2013 1437      Wt Readings from Last 3 Encounters:  03/18/14 234 lb 3.2 oz (106.232 kg)  12/17/13 223 lb (101.152 kg)  05/18/13 236 lb (107.049 kg)      Other studies Reviewed: Additional studies/ records that were reviewed today include: 2-D echo from 2013 Review of the above records demonstrates: Moderate aortic stenosis and mild to moderate mitral stenosis   Echo 02/07/12: Left ventricle: The cavity size was normal. There was mild concentric hypertrophy. Systolic function was normal. The estimated ejection fraction was in the range of 55% to 60%. Wall motion was normal;  there were no regional wall motion abnormalities. Doppler parameters are consistent with elevated mean left atrial filling pressure. - Aortic valve: There was moderate stenosis. Moderate regurgitation. Valve area: 1.26cm^2(VTI). Valve area: 1.19cm^2 (Vmax). - Mitral valve: Severely calcified annulus. The findings are consistent with mild to moderate stenosis. Valve area by pressure half-time: 2.12cm^2. Valve area by continuity equation (using LVOT flow): 1.56cm^2. - Left atrium: The atrium was severely dilated. - Atrial septum: No defect or patent foramen ovale was identified. - Pulmonary arteries: Systolic pressure was mildly increased. PA peak pressure: 61mm Hg (S).     Sumner Boast, PA-C  03/18/2014 9:01 AM    [facility] [place], [place],   [address] Phone: [phone]; Fax: [phone]

## 2014-03-18 NOTE — Assessment & Plan Note (Signed)
Patient had moderate aortic stenosis and mild to moderate mitral stenosis on 2-D echo in 2013. He was supposed to have a follow-up in June 2014 that was never done. He is now here for cardiac clearance prior to undergoing left hernia repair. He is asymptomatic. We will order a 2-D echo to be performed tomorrow prior to clearing him for surgery.

## 2014-03-18 NOTE — Assessment & Plan Note (Signed)
Patient had history of mild to moderate mitral stenosis on echo in 2013. We'll repeat.

## 2014-03-18 NOTE — Assessment & Plan Note (Signed)
Patient is currently trying to quit smoking. He has cut back from a half-pack a day to 4 cigarettes daily.

## 2014-03-18 NOTE — Patient Instructions (Signed)
Your physician recommends that you continue on your current medications as directed. Please refer to the Current Medication list given to you today.  Your physician has requested that you have an echocardiogram. Echocardiography is a painless test that uses sound waves to create images of your heart. It provides your doctor with information about the size and shape of your heart and how well your heart's chambers and valves are working. This procedure takes approximately one hour. There are no restrictions for this procedure.   Your echo is scheduled on 03/19/14 @ 3pm. Please arrive 15 minutes prior to appointment time  Your physician wants you to follow-up in: 6 months with Dr.Mcalhany You will receive a reminder letter in the mail two months in advance. If you don't receive a letter, please call our office to schedule the follow-up appointment.

## 2014-03-19 ENCOUNTER — Ambulatory Visit (HOSPITAL_COMMUNITY): Payer: Managed Care, Other (non HMO) | Attending: Cardiology | Admitting: Cardiology

## 2014-03-19 DIAGNOSIS — I050 Rheumatic mitral stenosis: Secondary | ICD-10-CM | POA: Diagnosis not present

## 2014-03-19 DIAGNOSIS — I350 Nonrheumatic aortic (valve) stenosis: Secondary | ICD-10-CM

## 2014-03-19 NOTE — Progress Notes (Signed)
Echo performed. 

## 2014-03-20 ENCOUNTER — Telehealth: Payer: Self-pay | Admitting: Cardiovascular Disease

## 2014-03-20 NOTE — Telephone Encounter (Signed)
New message     Request for surgical clearance:  What type of surgery is being performed? Hernia repair When is this surgery scheduled? Waiting on clearance 1. Are there any medications that need to be held prior to surgery and how long? no  2. Name of physician performing surgery? Dr Georgette Dover  3. What is your office phone and fax number? Fax [phone] 4. Pt had an echo yesterday----is he cleared?

## 2014-03-20 NOTE — Telephone Encounter (Signed)
Amy at Dr. Georgette Dover office left a message requesting pt to be clear for surgery "hernia repair". Pt was seen by Estella Husk PA on 2/8 and had an echo on 03/19/14. Dr.S office is closed at this time.

## 2014-03-21 NOTE — Telephone Encounter (Signed)
Will forward to Dr. Angelena Form to review

## 2014-03-22 ENCOUNTER — Encounter: Payer: Self-pay | Admitting: Cardiovascular Disease

## 2014-03-22 NOTE — Telephone Encounter (Signed)
Follow Up  Pt returning Pat's phone call. Please call back and discuss.

## 2014-03-22 NOTE — Telephone Encounter (Signed)
Letter now in EPIC. cdm

## 2014-03-22 NOTE — Telephone Encounter (Signed)
Spoke with pt and reviewed echo results with him and let him know Dr. Angelena Form has cleared him for surgery.

## 2014-03-22 NOTE — Telephone Encounter (Signed)
Clearance letter [phone]

## 2015-02-12 NOTE — Progress Notes (Signed)
Chief Complaint  Patient presents with  . Follow-up     History of Present Illness: 65 yo male with history of tobacco abuse, hyperlipidemia, esophageal stricture here today for cardiac follow up. I saw him January 2014 for evaluation after an abnormal echo. He has been noted in primary care to have a systolic murmur. Echo 01/3012 with moderate aortic valve stenosis and regurgitation and mild to moderate mitral valve stenosis, severe MAC and enlarged left atrium.  He was seen in January 2016 by Estella Husk, PA-C and was stable. Repeat echo 03/19/14 with normal LV size and function, thickened AV with moderate AS and moderate AI, moderate MS. He was having no symptoms at that time.   He is feeling well. He has no chest pain or SOB. He is trying to stop smoking. No lower extremity edema, orthopnea, PND. Weight is stable.   Primary Care Physician: Jill Alexanders  Past Medical History  Diagnosis Date  . Smoker   . Obesity   . Dyslipidemia   . Esophageal stricture   . BCE (basal cell epithelioma), arm     RIGHT SHOULDER  . Hyperlipidemia   . Inguinal hernia     bilateral    Past Surgical History  Procedure Laterality Date  . Thymectomy  1976  . Hernia repair  12/11/10    BIH  . Colonoscopy  03/2008,2004    Dr. Amedeo Plenty    Current Outpatient Prescriptions  Medication Sig Dispense Refill  . aspirin 81 MG tablet Take 81 mg by mouth once. Pt takes every other day     No current facility-administered medications for this visit.    No Known Allergies  Social History   Social History  . Marital Status: Married    Spouse Name: N/A  . Number of Children: 0  . Years of Education: N/A   Occupational History  . Photographer    Social History Main Topics  . Smoking status: Current Every Day Smoker -- 0.25 packs/day for 35 years    Types: Cigarettes  . Smokeless tobacco: Never Used  . Alcohol Use: 0.6 oz/week    1 Cans of beer per week  . Drug Use: No  . Sexual Activity: Yes    Other Topics Concern  . Not on file   Social History Narrative    Family History  Problem Relation Age of Onset  . Cancer Mother 65    colon  . Cancer Father 88    lung  . CAD Neg Hx     Review of Systems:  As stated in the HPI and otherwise negative.   BP 120/70 mmHg  Pulse 74  Ht 5\' 9"  (1.753 m)  Wt 229 lb 12.8 oz (104.237 kg)  BMI 33.92 kg/m2  Physical Examination: General: Well developed, well nourished, NAD HEENT: OP clear, mucus membranes moist SKIN: warm, dry. No rashes. Neuro: No focal deficits Musculoskeletal: Muscle strength 5/5 all ext Psychiatric: Mood and affect normal Neck: No JVD, no carotid bruits, no thyromegaly, no lymphadenopathy. Lungs:Clear bilaterally, no wheezes, rhonci, crackles Cardiovascular: Regular rate and rhythm. II/VI systolic murmur without gallops or rubs. + Diastolic murmur. Abdomen:Soft. Bowel sounds present. Non-tender.  Extremities: No lower extremity edema. Pulses are 1 + in the bilateral DP/PT.  Echo 03/19/14: Left ventricle: E/e&'>31 suggestive of elevated LV filling pressures. The cavity size was normal. There was mild focal basal hypertrophy of the septum. Systolic function was normal. The estimated ejection fraction was in the range of 60% to 65%. Wall  motion was normal; there were no regional wall motion abnormalities. - Aortic valve: Severe thickening and calcification. Valve mobility was restricted. There was moderate stenosis. There was moderate regurgitation by PHT of 368msec but by colorflow doppler in the apical 4 chamber and 3 chamber views appears worse. Consider TEE for further evaluation. - Mitral valve: Severely calcified annulus. Moderate thickening and calcification. Mobility was restricted. The findings are consistent with moderate stenosis. There was mild regurgitation. - Left atrium: The atrium was mildly dilated. - Pulmonic valve: There was mild regurgitation. - Pulmonary  arteries: PA peak pressure: 33 mm Hg (S).  EKG:  EKG is ordered today. The ekg ordered today demonstrates NSR, rate 78 bpm.   Recent Labs: No results found for requested labs within last 365 days.   Lipid Panel    Component Value Date/Time   CHOL 202* 05/18/2013 1437   TRIG 111 05/18/2013 1437   HDL 50 05/18/2013 1437   CHOLHDL 4.0 05/18/2013 1437   VLDL 22 05/18/2013 1437   LDLCALC 130* 05/18/2013 1437     Wt Readings from Last 3 Encounters:  02/13/15 229 lb 12.8 oz (104.237 kg)  03/18/14 234 lb 3.2 oz (106.232 kg)  12/17/13 223 lb (101.152 kg)     Other studies Reviewed: Additional studies/ records that were reviewed today include: . Review of the above records demonstrates:   Assessment and Plan:   1. Aortic stenosis: Moderate AS by echo with moderate aortic regurgitation February 201612. He has no symptoms of chest pain, SOB, CHF or syncope. I will arrange a TEE to better assess his aortic valve pathology. He wishes to wait until March. He will call back to arrange.   2. Mitral valve stenosis: Mild to moderate by echo February 2016  with severe mitral annular calcification. Will assess with TEE.   3. Tobacco abuse: He is trying to stop.   Current medicines are reviewed at length with the patient today.  The patient does not have concerns regarding medicines.  The following changes have been made:  no change  Labs/ tests ordered today include:   Orders Placed This Encounter  Procedures  . EKG 12-Lead    Disposition:   FU with me in 6 months  Signed, Lauree Chandler, MD 02/13/2015 9:48 AM    [facility] [place], [place], [place]  [address] Phone: [phone]; Fax: [phone]

## 2015-02-13 ENCOUNTER — Ambulatory Visit (INDEPENDENT_AMBULATORY_CARE_PROVIDER_SITE_OTHER): Payer: BC Managed Care – PPO | Admitting: Cardiovascular Disease

## 2015-02-13 ENCOUNTER — Encounter: Payer: Self-pay | Admitting: Cardiovascular Disease

## 2015-02-13 VITALS — BP 120/70 | HR 74 | Ht 69.0 in | Wt 229.8 lb

## 2015-02-13 DIAGNOSIS — I050 Rheumatic mitral stenosis: Secondary | ICD-10-CM | POA: Diagnosis not present

## 2015-02-13 DIAGNOSIS — I350 Nonrheumatic aortic (valve) stenosis: Secondary | ICD-10-CM

## 2015-02-13 NOTE — Patient Instructions (Signed)
Medication Instructions:  Your physician recommends that you continue on your current medications as directed. Please refer to the Current Medication list given to you today.   Labwork: None  Testing/Procedures: none  Follow-Up: Your physician wants you to follow-up in: 6 months.  You will receive a reminder letter in the mail two months in advance. If you don't receive a letter, please call our office to schedule the follow-up appointment.  Call us when you are ready to schedule procedure.   Any Other Special Instructions Will Be Listed Below (If Applicable).     If you need a refill on your cardiac medications before your next appointment, please call your pharmacy.

## 2015-03-25 ENCOUNTER — Ambulatory Visit (INDEPENDENT_AMBULATORY_CARE_PROVIDER_SITE_OTHER): Payer: BC Managed Care – PPO | Admitting: Family Medicine

## 2015-03-25 ENCOUNTER — Encounter: Payer: Self-pay | Admitting: Family Medicine

## 2015-03-25 VITALS — BP 122/74 | HR 88 | Ht 69.0 in | Wt 239.0 lb

## 2015-03-25 DIAGNOSIS — Z0000 Encounter for general adult medical examination without abnormal findings: Secondary | ICD-10-CM | POA: Diagnosis not present

## 2015-03-25 DIAGNOSIS — E669 Obesity, unspecified: Secondary | ICD-10-CM | POA: Diagnosis not present

## 2015-03-25 DIAGNOSIS — R6882 Decreased libido: Secondary | ICD-10-CM | POA: Diagnosis not present

## 2015-03-25 DIAGNOSIS — Z720 Tobacco use: Secondary | ICD-10-CM | POA: Diagnosis not present

## 2015-03-25 DIAGNOSIS — Z8719 Personal history of other diseases of the digestive system: Secondary | ICD-10-CM

## 2015-03-25 DIAGNOSIS — E7800 Pure hypercholesterolemia, unspecified: Secondary | ICD-10-CM

## 2015-03-25 DIAGNOSIS — F17200 Nicotine dependence, unspecified, uncomplicated: Secondary | ICD-10-CM

## 2015-03-25 DIAGNOSIS — I050 Rheumatic mitral stenosis: Secondary | ICD-10-CM

## 2015-03-25 DIAGNOSIS — I350 Nonrheumatic aortic (valve) stenosis: Secondary | ICD-10-CM

## 2015-03-25 DIAGNOSIS — Z1159 Encounter for screening for other viral diseases: Secondary | ICD-10-CM

## 2015-03-25 LAB — COMPREHENSIVE METABOLIC PANEL
ALT: 13 U/L (ref 9–46)
AST: 16 U/L (ref 10–35)
Albumin: 4 g/dL (ref 3.6–5.1)
Alkaline Phosphatase: 63 U/L (ref 40–115)
BUN: 11 mg/dL (ref 7–25)
CALCIUM: 9.3 mg/dL (ref 8.6–10.3)
CHLORIDE: 103 mmol/L (ref 98–110)
CO2: 27 mmol/L (ref 20–31)
Creat: 0.92 mg/dL (ref 0.70–1.25)
Glucose, Bld: 88 mg/dL (ref 65–99)
POTASSIUM: 4.3 mmol/L (ref 3.5–5.3)
Sodium: 138 mmol/L (ref 135–146)
Total Bilirubin: 0.7 mg/dL (ref 0.2–1.2)
Total Protein: 6.8 g/dL (ref 6.1–8.1)

## 2015-03-25 LAB — POCT URINALYSIS DIPSTICK
Bilirubin, UA: NEGATIVE
Blood, UA: NEGATIVE
Glucose, UA: NEGATIVE
KETONES UA: NEGATIVE
LEUKOCYTES UA: NEGATIVE
Nitrite, UA: NEGATIVE
PROTEIN UA: NEGATIVE
Spec Grav, UA: 1.025
Urobilinogen, UA: 4
pH, UA: 6

## 2015-03-25 LAB — CBC WITH DIFFERENTIAL/PLATELET
BASOS PCT: 1 % (ref 0–1)
Basophils Absolute: 0.1 10*3/uL (ref 0.0–0.1)
Eosinophils Absolute: 0.2 10*3/uL (ref 0.0–0.7)
Eosinophils Relative: 3 % (ref 0–5)
HEMATOCRIT: 43.1 % (ref 39.0–52.0)
HEMOGLOBIN: 14.5 g/dL (ref 13.0–17.0)
LYMPHS ABS: 1.4 10*3/uL (ref 0.7–4.0)
LYMPHS PCT: 20 % (ref 12–46)
MCH: 28.9 pg (ref 26.0–34.0)
MCHC: 33.6 g/dL (ref 30.0–36.0)
MCV: 86 fL (ref 78.0–100.0)
MONO ABS: 0.6 10*3/uL (ref 0.1–1.0)
MONOS PCT: 9 % (ref 3–12)
MPV: 11.4 fL (ref 8.6–12.4)
NEUTROS ABS: 4.8 10*3/uL (ref 1.7–7.7)
Neutrophils Relative %: 67 % (ref 43–77)
Platelets: 240 10*3/uL (ref 150–400)
RBC: 5.01 MIL/uL (ref 4.22–5.81)
RDW: 14.2 % (ref 11.5–15.5)
WBC: 7.1 10*3/uL (ref 4.0–10.5)

## 2015-03-25 LAB — LIPID PANEL
CHOL/HDL RATIO: 4.2 ratio (ref <=5.0)
CHOLESTEROL: 215 mg/dL — AB (ref 125–200)
HDL: 51 mg/dL (ref >=40)
LDL Cholesterol: 134 mg/dL — ABNORMAL HIGH (ref <130)
TRIGLYCERIDES: 148 mg/dL (ref <150)
VLDL: 30 mg/dL (ref <30)

## 2015-03-25 NOTE — Progress Notes (Deleted)
Patient ID: [name], male   DOB: [birth year], 65 y.o.   MRN: [id number] [name] is a 65 y.o. male who presents for annual wellness visit and follow-up on chronic medical conditions.  He has the following concerns:   Immunization History  Administered Date(s) Administered  . DTaP 10/31/1995  . Influenza Split 10/23/2010, 12/27/2011  . Influenza,inj,Quad PF,36+ Mos 12/17/2013  . Pneumococcal Polysaccharide-23 10/23/2010  . Tdap 12/27/2011  . Zoster 10/23/2010   Last colonoscopy:  Unknown ~~ 54yrs Last PSA: Dentist: Dr Marry Guan [place] Ophtho: Dr Retired Exercise: Cleotilde Neer  Other doctors caring for patient include:   Depression screen:  See questionnaire below.     Depression screen PHQ 2/9 12/27/2011  Decreased Interest 0  Down, Depressed, Hopeless 0  PHQ - 2 Score 0    Fall Screen: See Questionaire below.   Fall Risk  12/27/2011  Falls in the past year? No    ADL screen:  See questionnaire below.  Functional Status Survey:     End of Life Discussion:  Patient {ACTIONS; HAS/DOES NOT HAVE:19233} a living will and medical power of attorney   Review of Systems  Constitutional: -fever, -chills, -sweats, -unexpected weight change, -anorexia, -fatigue Allergy: -sneezing, -itching, -congestion Dermatology: denies changing moles, rash, lumps, new worrisome lesions ENT: -runny nose, -ear pain, -sore throat, -hoarseness, -sinus pain, -teeth pain, -tinnitus, -hearing loss, -epistaxis Cardiology:  -chest pain, -palpitations, -edema, -orthopnea, -paroxysmal nocturnal dyspnea Respiratory: -cough, -shortness of breath, -dyspnea on exertion, -wheezing, -hemoptysis Gastroenterology: -abdominal pain, -nausea, -vomiting, -diarrhea, -constipation, -blood in stool, -changes in bowel movement, -dysphagia Hematology: -bleeding or bruising problems Musculoskeletal: -arthralgias, -myalgias, -joint swelling, -back pain, -neck pain, -cramping, -gait changes Ophthalmology:  -vision changes, -eye redness, -itching, -discharge Urology: -dysuria, -difficulty urinating, -hematuria, -urinary frequency, -urgency, incontinence Neurology: -headache, -weakness, -tingling, -numbness, -speech abnormality, -memory loss, -falls, -dizziness Psychology:  -depressed mood, -agitation, -sleep problems   PHYSICAL EXAM:  BP 122/74 mmHg  Pulse 88  Ht 5\' 9"  (1.753 m)  Wt 239 lb (108.41 kg)  BMI 35.28 kg/m2  SpO2 99%  General Appearance: Alert, cooperative, no distress, appears stated age Head: Normocephalic, without obvious abnormality, atraumatic Eyes: PERRL, conjunctiva/corneas clear, EOM's intact, fundi benign Ears: Normal TM's and external ear canals Nose: Nares normal, mucosa normal, no drainage or sinus   tenderness Throat: Lips, mucosa, and tongue normal; teeth and gums normal Neck: Supple, no lymphadenopathy, thyroid:no enlargement/tenderness/nodules; no carotid bruit or JVD Back: Spine nontender, no curvature, ROM normal, no CVA tenderness Lungs: Clear to auscultation bilaterally without wheezes, rales or ronchi; respirations unlabored Chest Wall: No tenderness or deformity Heart: Regular rate and rhythm, S1 and S2 normal, no murmur, rub or gallop Breast Exam: No chest wall tenderness, masses or gynecomastia Abdomen: Soft, non-tender, nondistended, normoactive bowel sounds, no masses, no hepatosplenomegaly Genitalia: Normal male external genitalia without lesions.  Testicles without masses.  No inguinal hernias. Rectal: Normal sphincter tone, no masses or tenderness; guaiac negative stool.  Prostate smooth, no nodules, not enlarged. Extremities: No clubbing, cyanosis or edema Pulses: 2+ and symmetric all extremities Skin: Skin color, texture, turgor normal, no rashes or lesions Lymph nodes: Cervical, supraclavicular, and axillary nodes normal Neurologic: CNII-XII intact, normal strength, sensation and gait; reflexes 2+ and symmetric throughout   Psych: Normal  mood, affect, hygiene and grooming  ASSESSMENT/PLAN:    Discussed PSA screening (risks/benefits), recommended at least 30 minutes of aerobic activity at least 5 days/week; proper sunscreen use reviewed; healthy diet and alcohol recommendations (less than or equal  to 2 drinks/day) reviewed; regular seatbelt use; changing batteries in smoke detectors. Immunization recommendations discussed.  Colonoscopy recommendations reviewed.   Medicare Attestation I have personally reviewed: The patient's medical and social history Their use of alcohol, tobacco or illicit drugs Their current medications and supplements The patient's functional ability including ADLs,fall risks, home safety risks, cognitive, and hearing and visual impairment Diet and physical activities Evidence for depression or mood disorders  The patient's weight, height, and BMI have been recorded in the chart.  I have made referrals, counseling, and provided education to the patient based on review of the above and I have provided the patient with a written personalized care plan for preventive services.     Wyatt Haste, MD   03/25/2015

## 2015-03-25 NOTE — Progress Notes (Signed)
Subjective:    Patient ID: [name], male    DOB: 06-11-1950, 65 y.o.   MRN: [id number]  HPI He is here for a complete examination. He does have underlying aortic stenosis and does see cardiology regularly. He also has mitral valvular disease and again is being followed for this. He has had recent inguinal hernia repair and has noted some slight retraction of the left testing. He has a previous history of esophageal stricture but presently is on no medications for this. He does note difficulty with low libido but has not noted any energy or stamina issues. He continues to smoke but is in the process of slowly cutting back. He is using gum. Review of the record indicates slightly elevated lipid panel. He is now retired. He and his wife are now living separately into houses. They seem to be doing fairly well with this particular arrangement although when I quizzed him on that he did show some reservations. He has no chest pain, shortness of breath or GI problems. Family and social history as well as health maintenance and immunizations were reviewed   Review of Systems  All other systems reviewed and are negative.      Objective:   Physical Exam BP 122/74 mmHg  Pulse 88  Ht 5\' 9"  (1.753 m)  Wt 239 lb (108.41 kg)  BMI 35.28 kg/m2  SpO2 99%  General Appearance:    Alert, cooperative, no distress, appears stated age  Head:    Normocephalic, without obvious abnormality, atraumatic  Eyes:    PERRL, conjunctiva/corneas clear, EOM's intact, fundi    benign  Ears:    Normal TM's and external ear canals  Nose:   Nares normal, mucosa normal, no drainage or sinus   tenderness  Throat:   Lips, mucosa, and tongue normal; teeth and gums normal  Neck:   Supple, no lymphadenopathy;  thyroid:  no   enlargement/tenderness/nodules; no carotid   bruit or JVD  Back:    Spine nontender, no curvature, ROM normal, no CVA     tenderness  Lungs:     Clear to auscultation bilaterally without wheezes,  rales or     ronchi; respirations unlabored  Chest Wall:    No tenderness or deformity   Heart:    Regular rate and rhythm, S1 and S2 normal, no murmur, rub   or gallop  Breast Exam:    No chest wall tenderness, masses or gynecomastia  Abdomen:     Soft, non-tender, nondistended, normoactive bowel sounds,    no masses, no hepatosplenomegaly  Genitalia:    Normal male external genitalia without lesions.  Testicles without masses,left testes is slightly retracted.  No inguinal hernias.  Rectal:  Deferred  Extremities:   No clubbing, cyanosis or edema  Pulses:   2+ and symmetric all extremities  Skin:   Skin color, texture, turgor normal, no rashes or lesions  Lymph nodes:   Cervical, supraclavicular, and axillary nodes normal  Neurologic:   CNII-XII intact, normal strength, sensation and gait; reflexes 2+ and symmetric throughout          Psych:   Normal mood, affect, hygiene and grooming.          Assessment & Plan:  Annual physical exam - Plan: POCT urinalysis dipstick, CBC with Differential/Platelet, Comprehensive metabolic panel, Lipid panel  Obesity (BMI 30-39.9) - Plan: CBC with Differential/Platelet, Comprehensive metabolic panel, Lipid panel  Low libido - Plan: Testosterone  Borderline hypercholesterolemia - Plan: Lipid panel  Aortic stenosis  Current smoker  Mitral valve stenosis, unspecified etiology  History of esophageal stricture  Need for hepatitis C screening test - Plan: Hepatitis C antibody I explained that I did not think there was an issue with a slight retraction of the left testes. He will continue to be followed by cardiology. I then discussed the marital situation with him. Encouraged him to get involved in counseling to make sure that he is truly comfortable with the present living arrangement and also the legal ramifications of the present situation.He will return at age 62 to get his next pneumonia shot and set up for aneurysm screening.

## 2015-03-25 NOTE — Patient Instructions (Signed)
Call [phone] quit now When you retire you must keep your mind and body busy because if you don't use that you lose it

## 2015-03-26 LAB — TESTOSTERONE: TESTOSTERONE: 119 ng/dL — AB (ref 250–827)

## 2015-03-26 LAB — HEPATITIS C ANTIBODY: HCV AB: NEGATIVE

## 2015-03-26 NOTE — Addendum Note (Signed)
Addended by: Denita Lung on: 03/26/2015 07:55 AM   Modules accepted: Orders

## 2015-04-01 ENCOUNTER — Other Ambulatory Visit: Payer: BC Managed Care – PPO

## 2015-04-01 DIAGNOSIS — R6882 Decreased libido: Secondary | ICD-10-CM

## 2015-04-02 ENCOUNTER — Other Ambulatory Visit: Payer: Self-pay | Admitting: Family Medicine

## 2015-04-02 ENCOUNTER — Telehealth: Payer: Self-pay | Admitting: Family Medicine

## 2015-04-02 LAB — TESTOSTERONE: TESTOSTERONE: 123 ng/dL — AB (ref 250–827)

## 2015-04-02 MED ORDER — TESTOSTERONE 20.25 MG/ACT (1.62%) TD GEL: 2.0000 "application " | Freq: Every day | TRANSDERMAL | Status: DC

## 2015-04-02 NOTE — Progress Notes (Signed)
His testosterone level is still low. I will call in AndroGel. He is to come by and pick up a prescription and schedule follow-up appointment in 1 month.

## 2015-04-02 NOTE — Telephone Encounter (Signed)
Called pt advised testosterone has been called into [facility].  He has already picked up a discount card

## 2015-04-05 ENCOUNTER — Telehealth: Payer: Self-pay | Admitting: Family Medicine

## 2015-04-10 NOTE — Telephone Encounter (Signed)
Faxed additional information form to [facility]

## 2015-04-11 NOTE — Telephone Encounter (Signed)
P.A. Isabelle Course pt needs trial of both Androderm and Axiron, do you want to switch?

## 2015-04-11 NOTE — Telephone Encounter (Signed)
Let him know that we have to try Axiron first and call it in

## 2015-04-16 ENCOUNTER — Telehealth: Payer: Self-pay | Admitting: Family Medicine

## 2015-04-16 NOTE — Telephone Encounter (Signed)
Please call PA for androgel

## 2015-04-17 MED ORDER — TESTOSTERONE 30 MG/ACT TD SOLN: 2.0000 "application " | Freq: Every day | TRANSDERMAL | Status: DC

## 2015-04-17 NOTE — Telephone Encounter (Signed)
Axiron approved til 04/16/2018

## 2015-04-17 NOTE — Telephone Encounter (Signed)
Changed to Axiron, it required P.A. Also, this was completed and approved.  Called pharmacy & went thru for $30  Left message for pt

## 2015-04-17 NOTE — Telephone Encounter (Signed)
Done see other telephone call °

## 2015-05-06 ENCOUNTER — Ambulatory Visit (INDEPENDENT_AMBULATORY_CARE_PROVIDER_SITE_OTHER): Payer: BC Managed Care – PPO | Admitting: Family Medicine

## 2015-05-06 DIAGNOSIS — E291 Testicular hypofunction: Secondary | ICD-10-CM

## 2015-05-06 NOTE — Progress Notes (Signed)
   Subjective:    Patient ID: [name], male    DOB: [birth year], 65 y.o.   MRN: [id number]  HPI    Review of Systems     Objective:   Physical Exam        Assessment & Plan:  He is here for blood work only. Hypogonadism in male - Plan: Testosterone, CANCELED: Testosterone

## 2015-05-07 LAB — TESTOSTERONE: TESTOSTERONE: 248 ng/dL — AB (ref 250–827)

## 2015-05-08 ENCOUNTER — Other Ambulatory Visit: Payer: Self-pay | Admitting: Family Medicine

## 2015-05-08 MED ORDER — TESTOSTERONE 30 MG/ACT TD SOLN: 4.0000 "application " | Freq: Every day | TRANSDERMAL | Status: DC

## 2015-09-04 ENCOUNTER — Telehealth: Payer: Self-pay

## 2015-09-04 NOTE — Telephone Encounter (Signed)
Talked with pt he said he would try the double dose

## 2015-09-04 NOTE — Telephone Encounter (Signed)
Spoke with pt- he called the office with concerns about his testosterone. He reports he has been having less energy since starting testosterone and wonders if he can stop taking the testosterone to see if this can help his energy level. Please advise. Pt can be reached at    [phone]

## 2015-09-04 NOTE — Telephone Encounter (Signed)
Let him know that I would think quite the opposite. It should not cause decreased energy. My thought would be to have him double his dosing

## 2015-10-28 NOTE — Progress Notes (Addendum)
Chief Complaint  Patient presents with  . Follow-up     History of Present Illness: 65 yo male with history of aortic stenosis mitral stenosis, former tobacco abuse, hyperlipidemia, esophageal stricture here today for cardiac follow up. I saw him January 2014 for evaluation after an abnormal echo. He has been noted in primary care to have a systolic murmur. Echo 01/3012 with moderate aortic valve stenosis and regurgitation and mild to moderate mitral valve stenosis, severe MAC and enlarged left atrium.  He was seen in January 2016 by Estella Husk, PA-C and was stable. Repeat echo 03/19/14 with normal LV size and function, thickened AV with moderate AS and moderate AI, moderate MS. He was having no symptoms at that time. We discussed arranging a TEE to better define his aortic valve disease but he did not call back to schedule.   He is here today for follow up. He is feeling well. He has no chest pain or SOB. He stopped smoking. No lower extremity edema, orthopnea, PND. Weight is stable.   Primary Care Physician: Wyatt Haste, MD  Past Medical History:  Diagnosis Date  . BCE (basal cell epithelioma), arm    RIGHT SHOULDER  . Dyslipidemia   . Esophageal stricture   . Hyperlipidemia   . Inguinal hernia    bilateral  . Obesity   . Smoker     Past Surgical History:  Procedure Laterality Date  . COLONOSCOPY  03/2008,2004   Dr. Amedeo Plenty  . HERNIA REPAIR  12/11/10   BIH  . THYMECTOMY  1976    Current Outpatient Prescriptions  Medication Sig Dispense Refill  . aspirin 81 MG tablet Take 81 mg by mouth once. Pt takes every other day    . Testosterone (AXIRON) 30 MG/ACT SOLN Place 4 application onto the skin daily. 180 mL 5   No current facility-administered medications for this visit.     No Known Allergies  Social History   Social History  . Marital status: Married    Spouse name: N/A  . Number of children: 0  . Years of education: N/A   Occupational History  .  Photographer [employer]   Social History Main Topics  . Smoking status: Current Every Day Smoker    Packs/day: 0.25    Years: 35.00    Types: Cigarettes  . Smokeless tobacco: Never Used  . Alcohol use 0.6 oz/week    1 Cans of beer per week  . Drug use: No  . Sexual activity: Yes   Other Topics Concern  . Not on file   Social History Narrative  . No narrative on file    Family History  Problem Relation Age of Onset  . Cancer Mother 4    colon  . Cancer Father 65    lung  . CAD Neg Hx     Review of Systems:  As stated in the HPI and otherwise negative.   BP 110/60 (BP Location: Left Arm, Patient Position: Sitting, Cuff Size: Normal)   Pulse 88   Ht 5\' 9"  (1.753 m)   Wt 107.5 kg (237 lb)   BMI 35.00 kg/m   Physical Examination: General: Well developed, well nourished, NAD  HEENT: OP clear, mucus membranes moist  SKIN: warm, dry. No rashes. Neuro: No focal deficits  Musculoskeletal: Muscle strength 5/5 all ext  Psychiatric: Mood and affect normal  Neck: No JVD, no carotid bruits, no thyromegaly, no lymphadenopathy.  Lungs:Clear bilaterally, no wheezes, rhonci, crackles Cardiovascular: Regular rate and rhythm.  II/VI systolic murmur without gallops or rubs. + Diastolic murmur. Abdomen:Soft. Bowel sounds present. Non-tender.  Extremities: No lower extremity edema. Pulses are 1 + in the bilateral DP/PT.  Echo 03/19/14: Left ventricle: E/e&'>31 suggestive of elevated LV filling pressures. The cavity size was normal. There was mild focal basal hypertrophy of the septum. Systolic function was normal. The estimated ejection fraction was in the range of 60% to 65%. Wall motion was normal; there were no regional wall motion abnormalities. - Aortic valve: Severe thickening and calcification. Valve mobility was restricted. There was moderate stenosis. There was moderate regurgitation by PHT of 324msec but by colorflow doppler in the apical 4 chamber and 3  chamber views appears worse. Consider TEE for further evaluation. - Mitral valve: Severely calcified annulus. Moderate thickening and calcification. Mobility was restricted. The findings are consistent with moderate stenosis. There was mild regurgitation. - Left atrium: The atrium was mildly dilated. - Pulmonic valve: There was mild regurgitation. - Pulmonary arteries: PA peak pressure: 33 mm Hg (S).  EKG:  EKG is not ordered today. The ekg ordered today demonstrates   Recent Labs: 03/25/2015: ALT 13; BUN 11; Creat 0.92; Hemoglobin 14.5; Platelets 240; Potassium 4.3; Sodium 138   Lipid Panel    Component Value Date/Time   CHOL 215 (H) 03/25/2015 0001   TRIG 148 03/25/2015 0001   HDL 51 03/25/2015 0001   CHOLHDL 4.2 03/25/2015 0001   VLDL 30 03/25/2015 0001   LDLCALC 134 (H) 03/25/2015 0001     Wt Readings from Last 3 Encounters:  10/29/15 107.5 kg (237 lb)  03/25/15 108.4 kg (239 lb)  02/13/15 104.2 kg (229 lb 12.8 oz)     Other studies Reviewed: Additional studies/ records that were reviewed today include: . Review of the above records demonstrates:   Assessment and Plan:   1. Aortic stenosis: Moderate AS by echo with moderate aortic regurgitation February 2016. He has no symptoms of chest pain, SOB, CHF or syncope. I will arrange a follow up echo today.    2. Mitral valve stenosis: Mild to moderate by echo February 2016  with severe mitral annular calcification. Repeat echo now.   3. Tobacco abuse, in remission: He stopped smoking  Addendum: 11/24/15: Pt called back to schedule his TEE to better define his valve disease. This addendum is an update to the note above. His TEE is planned for 12/12/15 with Dr. Recardo Evangelist at [facility].   Current medicines are reviewed at length with the patient today.  The patient does not have concerns regarding medicines.  The following changes have been made:  no change  Labs/ tests ordered today include:   Orders Placed This  Encounter  Procedures  . ECHOCARDIOGRAM COMPLETE    Disposition:   FU with me in 6 months  Signed, Lauree Chandler, MD 10/29/2015 12:16 PM    [facility] [address], [place], [place]  [address] Phone: [phone]; Fax: [phone]    Addendum: 11/24/15: Pt called back to schedule his TEE to better define his valve disease. This addendum is an update to the note above. His TEE is planned for 12/12/15 with Dr. Recardo Evangelist at [facility]. There are no other changes in his History since my last visit.  Lauree Chandler 11/24/2015 1:22 PM

## 2015-10-29 ENCOUNTER — Ambulatory Visit (INDEPENDENT_AMBULATORY_CARE_PROVIDER_SITE_OTHER): Payer: Medicare Other | Admitting: Cardiovascular Disease

## 2015-10-29 ENCOUNTER — Encounter: Payer: Self-pay | Admitting: Cardiovascular Disease

## 2015-10-29 VITALS — BP 110/60 | HR 88 | Ht 69.0 in | Wt 237.0 lb

## 2015-10-29 DIAGNOSIS — I350 Nonrheumatic aortic (valve) stenosis: Secondary | ICD-10-CM | POA: Diagnosis not present

## 2015-10-29 DIAGNOSIS — I050 Rheumatic mitral stenosis: Secondary | ICD-10-CM

## 2015-10-29 NOTE — Patient Instructions (Signed)

## 2015-11-05 ENCOUNTER — Other Ambulatory Visit: Payer: Self-pay | Admitting: Family Medicine

## 2015-11-05 ENCOUNTER — Encounter: Payer: Self-pay | Admitting: Family Medicine

## 2015-11-05 ENCOUNTER — Ambulatory Visit (INDEPENDENT_AMBULATORY_CARE_PROVIDER_SITE_OTHER): Payer: Medicare Other | Admitting: Family Medicine

## 2015-11-05 VITALS — BP 110/70 | HR 68 | Resp 16 | Ht 68.5 in | Wt 236.6 lb

## 2015-11-05 DIAGNOSIS — Z111 Encounter for screening for respiratory tuberculosis: Secondary | ICD-10-CM | POA: Diagnosis not present

## 2015-11-05 DIAGNOSIS — E291 Testicular hypofunction: Secondary | ICD-10-CM | POA: Diagnosis not present

## 2015-11-05 DIAGNOSIS — E669 Obesity, unspecified: Secondary | ICD-10-CM

## 2015-11-05 DIAGNOSIS — I050 Rheumatic mitral stenosis: Secondary | ICD-10-CM

## 2015-11-05 DIAGNOSIS — Z23 Encounter for immunization: Secondary | ICD-10-CM | POA: Diagnosis not present

## 2015-11-05 DIAGNOSIS — Z125 Encounter for screening for malignant neoplasm of prostate: Secondary | ICD-10-CM | POA: Diagnosis not present

## 2015-11-05 DIAGNOSIS — R972 Elevated prostate specific antigen [PSA]: Secondary | ICD-10-CM

## 2015-11-05 DIAGNOSIS — Z0000 Encounter for general adult medical examination without abnormal findings: Secondary | ICD-10-CM

## 2015-11-05 DIAGNOSIS — I350 Nonrheumatic aortic (valve) stenosis: Secondary | ICD-10-CM

## 2015-11-05 NOTE — Progress Notes (Signed)
[name] is a 65 y.o. male who presents for his welcome to Medicare visit and follow-up on chronic medical conditions.  He has the following concerns: He does have underlying aortic and mitral valvular disease and is being followed by cardiology. He is scheduled for an echocardiogram in the near future. He also continues on testosterone replacement using 4 pumps per day. He does occasionally smoke. 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   Immunizations and Health Maintenance Immunization History  Administered Date(s) Administered  . DTaP 10/31/1995  . Influenza Split 10/23/2010, 12/27/2011  . Influenza,inj,Quad PF,36+ Mos 12/17/2013  . Pneumococcal Polysaccharide-23 10/23/2010  . Tdap 12/27/2011  . Zoster 10/23/2010   Health Maintenance Due  Topic Date Due  . HIV Screening  10/22/1965  . INFLUENZA VACCINE  09/09/2015  . PNA vac Low Risk Adult (1 of 2 - PCV13) 10/23/2015    Last colonoscopy: 05/2013  Last [id number]  Dentist: Dr. Heloise Purpura,   Ophtho: does not have regular eye doctor. 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 Blanca Friend  Advanced Directives: none information given    Depression screen:  See questionnaire below.     Depression screen [facility] 2/9 11/05/2015 12/27/2011  Decreased Interest 0 0  Down, Depressed, Hopeless 0 0  PHQ - 2 Score 0 0    Fall Screen: See Questionaire below.   Fall Risk  11/05/2015 12/27/2011  Falls in the past year? No No    ADL screen:  See questionnaire below.  Functional Status Survey: Is the patient deaf or have difficulty hearing?: No Does the patient have difficulty seeing, even when wearing glasses/contacts?: No Does the patient have difficulty concentrating, remembering, or  making decisions?: No Does the patient have difficulty walking or climbing stairs?: No Does the patient have difficulty dressing or bathing?: No Does the patient have difficulty doing errands alone such as visiting a doctor's office or shopping?: No   Review of Systems  Constitutional: -, -unexpected weight change, -anorexia, -fatigue Allergy: -sneezing, -itching, -congestion Dermatology: denies changing moles, rash, lumps ENT: -runny nose, -ear pain, -sore throat,  Cardiology:  -chest pain, -palpitations, -orthopnea, Respiratory: -cough, -shortness of breath, -dyspnea on exertion, -wheezing,  Gastroenterology: -abdominal pain, -nausea, -vomiting, -diarrhea, -constipation, -dysphagia Hematology: -bleeding or bruising problems Musculoskeletal: -arthralgias, -myalgias, -joint swelling, -back pain, - Ophthalmology: -vision changes,  Urology: -dysuria, -difficulty urinating,  -urinary frequency, -urgency, incontinence Neurology: -, -numbness, , -memory loss, -falls, -dizziness    PHYSICAL EXAM:   General Appearance: Alert, cooperative, no distress, appears stated age Head: Normocephalic, without obvious abnormality, atraumatic Eyes: PERRL, conjunctiva/corneas clear, EOM's intact, fundi benign Ears: Normal TM's and external ear canals Nose: Nares normal, mucosa normal, no drainage or sinus   tenderness Throat: Lips, mucosa, and tongue normal; teeth and gums normal Neck: Supple, no lymphadenopathy, thyroid:no enlargement/tenderness/nodules; no carotid bruit or JVD Lungs: Clear to auscultation bilaterally without wheezes, rales or ronchi; respirations unlabored Heart: Regular rate and rhythm, S1 and S2 normal,3/6 SEM noted. Abdomen: Soft, non-tender, nondistended, normoactive bowel sounds, no masses, no hepatosplenomegaly Extremities: No clubbing, cyanosis or edema Pulses: 2+ and symmetric all extremities Skin: Skin color, texture, turgor normal, no rashes or lesions Lymph nodes:  Cervical, supraclavicular, and axillary nodes normal  Neurologic: CNII-XII intact, normal strength, sensation and gait; reflexes 2+ and symmetric throughout   Psych: Normal mood, affect, hygiene and grooming Genital exam does show left he see to be retracted. 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 I will call back with results of his testosterone and will also check his PSA. Encouraged him to continue to work to keep physically and intellectually busy. He will continue be followed by cardiology. Recommend he discuss the retracted testing with his surgeon as this was a residual effect from his renal surgery. Again encouraged him to quit smoking. His actions were updated.   , recommended at least 30 minutes of aerobic activity at least 5 days/week; proper sunscreen use reviewed; healthy diet and alcohol recommendations (less than or equal to 2 drinks/day) reviewed; regular seatbelt useImmunization recommendations discussed.    Medicare Attestation I have personally reviewed: The patient's medical and social history Their use of alcohol, tobacco or illicit drugs Their current medications and supplements The patient's functional ability including ADLs,fall risks, home safety risks, cognitive, and hearing and visual impairment Diet and physical activities Evidence for depression or mood disorders  The patient's weight, height, and BMI have been recorded in the chart.  I have made referrals,  counseling, and provided education to the patient based on review of the above and I have provided the patient with a written personalized care plan for preventive services.     Michael Haste, MD   11/05/2015

## 2015-11-06 LAB — PSA: PSA: 6.2 ng/mL — AB (ref <=4.0)

## 2015-11-06 LAB — TESTOSTERONE: Testosterone: 241 ng/dL — ABNORMAL LOW (ref 250–827)

## 2015-11-07 LAB — TB SKIN TEST
Induration: 0 mm
TB Skin Test: NEGATIVE

## 2015-11-07 LAB — PSA, TOTAL AND FREE
PSA, % Free: 13 % — ABNORMAL LOW (ref >25)
PSA, Free: 0.9 ng/mL
PSA, TOTAL: 7.2 ng/mL — AB (ref <=4.0)

## 2015-11-07 NOTE — Addendum Note (Signed)
Addended by: Denita Lung on: 11/07/2015 12:32 PM   Modules accepted: Orders

## 2015-11-07 NOTE — Progress Notes (Signed)
The blood work did come back with an elevated PSA of 6.2 and percent PSA of 13% which made him roughly 30% chance of having prostate cancer. I discussed this with him in detail and various options including stopping testosterone and rechecking versus referral to urology versus continuing the testosterone and monitoring the PSA. He has decided to stop the testosterone and recheck this in about 3 months.

## 2015-11-12 ENCOUNTER — Other Ambulatory Visit (HOSPITAL_COMMUNITY): Payer: Medicare Other

## 2015-11-12 ENCOUNTER — Ambulatory Visit (HOSPITAL_COMMUNITY): Payer: Medicare Other | Attending: Cardiovascular Disease

## 2015-11-12 ENCOUNTER — Other Ambulatory Visit: Payer: Self-pay

## 2015-11-12 DIAGNOSIS — I050 Rheumatic mitral stenosis: Secondary | ICD-10-CM | POA: Diagnosis not present

## 2015-11-12 DIAGNOSIS — I350 Nonrheumatic aortic (valve) stenosis: Secondary | ICD-10-CM

## 2015-11-12 DIAGNOSIS — I352 Nonrheumatic aortic (valve) stenosis with insufficiency: Secondary | ICD-10-CM | POA: Insufficient documentation

## 2015-11-12 DIAGNOSIS — I517 Cardiomegaly: Secondary | ICD-10-CM | POA: Diagnosis not present

## 2015-11-12 DIAGNOSIS — I340 Nonrheumatic mitral (valve) insufficiency: Secondary | ICD-10-CM | POA: Insufficient documentation

## 2015-11-12 DIAGNOSIS — E785 Hyperlipidemia, unspecified: Secondary | ICD-10-CM | POA: Diagnosis not present

## 2015-11-12 DIAGNOSIS — Z720 Tobacco use: Secondary | ICD-10-CM | POA: Diagnosis not present

## 2015-11-18 ENCOUNTER — Telehealth: Payer: Self-pay | Admitting: Cardiovascular Disease

## 2015-11-18 DIAGNOSIS — I050 Rheumatic mitral stenosis: Secondary | ICD-10-CM

## 2015-11-18 DIAGNOSIS — I350 Nonrheumatic aortic (valve) stenosis: Secondary | ICD-10-CM

## 2015-11-18 NOTE — Telephone Encounter (Signed)
Pt calling to see if he can request 12-12-15 for his TEE, aware Pat off today pls call tomorrow

## 2015-11-19 ENCOUNTER — Encounter: Payer: Self-pay | Admitting: *Deleted

## 2015-11-19 NOTE — Telephone Encounter (Signed)
I spoke with pt and verbally went over TEE instructions with him. Will mail copy to him.  He will come in for BMP and CBC on 12/08/15.

## 2015-11-19 NOTE — Telephone Encounter (Signed)
TEE scheduled with Dr. Sallyanne Kuster for November 3,2017 at 8:00.  Booking [phone].

## 2015-11-24 NOTE — Telephone Encounter (Signed)
TEE orders placed and note updated. cdm

## 2015-11-24 NOTE — Addendum Note (Signed)
Addended by: Lauree Chandler D on: 11/24/2015 01:25 PM   Modules accepted: Orders, SmartSet

## 2015-12-08 ENCOUNTER — Other Ambulatory Visit: Payer: Medicare Other | Admitting: *Deleted

## 2015-12-08 DIAGNOSIS — I050 Rheumatic mitral stenosis: Secondary | ICD-10-CM

## 2015-12-08 DIAGNOSIS — I350 Nonrheumatic aortic (valve) stenosis: Secondary | ICD-10-CM

## 2015-12-08 LAB — CBC
HEMATOCRIT: 44.3 % (ref 38.5–50.0)
HEMOGLOBIN: 15.3 g/dL (ref 13.2–17.1)
MCH: 29.1 pg (ref 27.0–33.0)
MCHC: 34.5 g/dL (ref 32.0–36.0)
MCV: 84.4 fL (ref 80.0–100.0)
MPV: 12.2 fL (ref 7.5–12.5)
Platelets: 214 10*3/uL (ref 140–400)
RBC: 5.25 MIL/uL (ref 4.20–5.80)
RDW: 14.7 % (ref 11.0–15.0)
WBC: 6.5 10*3/uL (ref 3.8–10.8)

## 2015-12-08 LAB — BASIC METABOLIC PANEL
BUN: 17 mg/dL (ref 7–25)
CALCIUM: 9.3 mg/dL (ref 8.6–10.3)
CO2: 26 mmol/L (ref 20–31)
Chloride: 106 mmol/L (ref 98–110)
Creat: 0.92 mg/dL (ref 0.70–1.25)
Glucose, Bld: 95 mg/dL (ref 65–99)
POTASSIUM: 4.3 mmol/L (ref 3.5–5.3)
SODIUM: 139 mmol/L (ref 135–146)

## 2015-12-12 ENCOUNTER — Ambulatory Visit (HOSPITAL_BASED_OUTPATIENT_CLINIC_OR_DEPARTMENT_OTHER): Payer: Medicare Other

## 2015-12-12 ENCOUNTER — Encounter (HOSPITAL_COMMUNITY)
Admission: RE | Disposition: A | Discharge status: Home / Self Care | Payer: Self-pay | Source: Ambulatory Visit | Attending: Cardiovascular Disease

## 2015-12-12 ENCOUNTER — Ambulatory Visit (HOSPITAL_COMMUNITY)
Admission: RE | Admit: 2015-12-12 | Discharge: 2015-12-12 | Disposition: A | Discharge status: Home / Self Care | Payer: Medicare Other | Source: Ambulatory Visit | Attending: Cardiovascular Disease | Admitting: Cardiovascular Disease

## 2015-12-12 ENCOUNTER — Encounter (HOSPITAL_COMMUNITY): Payer: Self-pay | Admitting: *Deleted

## 2015-12-12 DIAGNOSIS — K222 Esophageal obstruction: Secondary | ICD-10-CM | POA: Diagnosis not present

## 2015-12-12 DIAGNOSIS — I080 Rheumatic disorders of both mitral and aortic valves: Secondary | ICD-10-CM | POA: Insufficient documentation

## 2015-12-12 DIAGNOSIS — Z801 Family history of malignant neoplasm of trachea, bronchus and lung: Secondary | ICD-10-CM | POA: Diagnosis not present

## 2015-12-12 DIAGNOSIS — Z7982 Long term (current) use of aspirin: Secondary | ICD-10-CM | POA: Insufficient documentation

## 2015-12-12 DIAGNOSIS — Z800 Family history of malignant neoplasm of digestive organs: Secondary | ICD-10-CM | POA: Diagnosis not present

## 2015-12-12 DIAGNOSIS — Z6835 Body mass index (BMI) 35.0-35.9, adult: Secondary | ICD-10-CM | POA: Diagnosis not present

## 2015-12-12 DIAGNOSIS — I350 Nonrheumatic aortic (valve) stenosis: Secondary | ICD-10-CM

## 2015-12-12 DIAGNOSIS — F17210 Nicotine dependence, cigarettes, uncomplicated: Secondary | ICD-10-CM | POA: Diagnosis not present

## 2015-12-12 DIAGNOSIS — E669 Obesity, unspecified: Secondary | ICD-10-CM | POA: Insufficient documentation

## 2015-12-12 DIAGNOSIS — Z9889 Other specified postprocedural states: Secondary | ICD-10-CM | POA: Diagnosis not present

## 2015-12-12 DIAGNOSIS — Z79899 Other long term (current) drug therapy: Secondary | ICD-10-CM | POA: Insufficient documentation

## 2015-12-12 DIAGNOSIS — Z85828 Personal history of other malignant neoplasm of skin: Secondary | ICD-10-CM | POA: Diagnosis not present

## 2015-12-12 DIAGNOSIS — I050 Rheumatic mitral stenosis: Secondary | ICD-10-CM

## 2015-12-12 DIAGNOSIS — E785 Hyperlipidemia, unspecified: Secondary | ICD-10-CM | POA: Insufficient documentation

## 2015-12-12 HISTORY — PX: TEE WITHOUT CARDIOVERSION: SHX5443

## 2015-12-12 SURGERY — ECHOCARDIOGRAM, TRANSESOPHAGEAL
Anesthesia: Moderate Sedation

## 2015-12-12 MED ORDER — MIDAZOLAM HCL 5 MG/ML IJ SOLN
INTRAMUSCULAR | Status: AC
  Filled 2015-12-12: qty 2

## 2015-12-12 MED ORDER — MIDAZOLAM HCL 10 MG/2ML IJ SOLN
INTRAMUSCULAR | Status: DC | PRN
  Administered 2015-12-12 (×2): 2 mg via INTRAVENOUS

## 2015-12-12 MED ORDER — FENTANYL CITRATE (PF) 100 MCG/2ML IJ SOLN
INTRAMUSCULAR | Status: DC | PRN
  Administered 2015-12-12 (×2): 25 ug via INTRAVENOUS

## 2015-12-12 MED ORDER — DIPHENHYDRAMINE HCL 50 MG/ML IJ SOLN
INTRAMUSCULAR | Status: AC
  Filled 2015-12-12: qty 1

## 2015-12-12 MED ORDER — FENTANYL CITRATE (PF) 100 MCG/2ML IJ SOLN
INTRAMUSCULAR | Status: AC
  Filled 2015-12-12: qty 2

## 2015-12-12 MED ORDER — SODIUM CHLORIDE 0.9 % IV SOLN
INTRAVENOUS | Status: DC
  Administered 2015-12-12: 07:00:00 via INTRAVENOUS

## 2015-12-12 MED ORDER — BUTAMBEN-TETRACAINE-BENZOCAINE 2-2-14 % EX AERO
INHALATION_SPRAY | CUTANEOUS | Status: DC | PRN
  Administered 2015-12-12: 2 via TOPICAL

## 2015-12-12 NOTE — Progress Notes (Signed)
*  PRELIMINARY RESULTS* Echocardiogram TEE has been performed.  [name] 12/12/2015, 9:31 AM

## 2015-12-12 NOTE — Discharge Instructions (Signed)
TEE  YOU HAD AN CARDIAC PROCEDURE TODAY: Refer to the procedure report and other information in the discharge instructions given to you for any specific questions about what was found during the examination. If this information does not answer your questions, please call [facility] office at [phone] to clarify.   DIET: Your first meal following the procedure should be a light meal and then it is ok to progress to your normal diet. A half-sandwich or bowl of soup is an example of a good first meal. Heavy or fried foods are harder to digest and may make you feel nauseous or bloated. Drink plenty of fluids but you should avoid alcoholic beverages for 24 hours. If you had a esophageal dilation, please see attached instructions for diet.   ACTIVITY: Your care partner should take you home directly after the procedure. You should plan to take it easy, moving slowly for the rest of the day. You can resume normal activity the day after the procedure however YOU SHOULD NOT DRIVE, use power tools, machinery or perform tasks that involve climbing or major physical exertion for 24 hours (because of the sedation medicines used during the test).   SYMPTOMS TO REPORT IMMEDIATELY: A cardiologist can be reached at any hour. Please call [phone] for any of the following symptoms:  Vomiting of blood or coffee ground material  New, significant abdominal pain  New, significant chest pain or pain under the shoulder blades  Painful or persistently difficult swallowing  New shortness of breath  Black, tarry-looking or red, bloody stools  FOLLOW UP:  Please also call with any specific questions about appointments or follow up tests.  Moderate Conscious Sedation, Adult, Care After Refer to this sheet in the next few weeks. These instructions provide you with information on caring for yourself after your procedure. Your health care provider may also give you more specific instructions. Your treatment has been  planned according to current medical practices, but problems sometimes occur. Call your health care provider if you have any problems or questions after your procedure. WHAT TO EXPECT AFTER THE PROCEDURE  After your procedure:  You may feel sleepy, clumsy, and have poor balance for several hours.  Vomiting may occur if you eat too soon after the procedure. HOME CARE INSTRUCTIONS  Do not participate in any activities where you could become injured for at least 24 hours. Do not:  Drive.  Swim.  Ride a bicycle.  Operate heavy machinery.  Cook.  Use power tools.  Climb ladders.  Work from a high place.  Do not make important decisions or sign legal documents until you are improved.  If you vomit, drink water, juice, or soup when you can drink without vomiting. Make sure you have little or no nausea before eating solid foods.  Only take over-the-counter or prescription medicines for pain, discomfort, or fever as directed by your health care provider.  Make sure you and your family fully understand everything about the medicines given to you, including what side effects may occur.  You should not drink alcohol, take sleeping pills, or take medicines that cause drowsiness for at least 24 hours.  If you smoke, do not smoke without supervision.  If you are feeling better, you may resume normal activities 24 hours after you were sedated.  Keep all appointments with your health care provider. SEEK MEDICAL CARE IF:  Your skin is pale or bluish in color.  You continue to feel nauseous or vomit.  Your pain is getting  worse and is not helped by medicine.  You have bleeding or swelling.  You are still sleepy or feeling clumsy after 24 hours. SEEK IMMEDIATE MEDICAL CARE IF:  You develop a rash.  You have difficulty breathing.  You develop any type of allergic problem.  You have a fever. MAKE SURE YOU:  Understand these instructions.  Will watch your condition.  Will  get help right away if you are not doing well or get worse.   This information is not intended to replace advice given to you by your health care provider. Make sure you discuss any questions you have with your health care provider.   Document Released: 11/15/2012 Document Revised: 02/15/2014 Document Reviewed: 11/15/2012 Elsevier Interactive Patient Education [employer].

## 2015-12-12 NOTE — H&P (Signed)
    Chief Complaint:  TEE, referred by Dr. Angelena Form  HPI:  This is a 65 y.o. male with a past medical history significant for aortic stenosis mitral stenosis, former tobacco abuse, hyperlipidemia, esophageal stricture here for TEE to better define his aortic valve disease.   He is here today for follow up. He is feeling well. He has no chest pain or SOB. He stopped smoking. No lower extremity edema, orthopnea, PND. Weight is stable.  PMHx:  Past Medical History:  Diagnosis Date  . BCE (basal cell epithelioma), arm    RIGHT SHOULDER  . Dyslipidemia   . Esophageal stricture   . Hyperlipidemia   . Inguinal hernia    bilateral  . Obesity   . Smoker     Past Surgical History:  Procedure Laterality Date  . COLONOSCOPY  03/2008,2004   Dr. Amedeo Plenty  . HERNIA REPAIR  12/11/10   BIH  . THYMECTOMY  1976    FAMHx:  Family History  Problem Relation Age of Onset  . Cancer Mother 48    colon  . Cancer Father 64    lung  . CAD Neg Hx     SOCHx:   reports that he has been smoking Cigarettes.  He has a 8.75 pack-year smoking history. He has never used smokeless tobacco. He reports that he drinks about 0.6 oz of alcohol per week . He reports that he does not use drugs.  ALLERGIES:  No Known Allergies  ROS: Pertinent items noted in HPI and remainder of comprehensive ROS otherwise negative.  HOME MEDS: Medications Prior to Admission  Medication Sig Dispense Refill  . aspirin 81 MG tablet Take 81 mg by mouth once. Pt takes every other day    . Testosterone (AXIRON) 30 MG/ACT SOLN Place 4 application onto the skin daily. 180 mL 5    LABS/IMAGING: No results found for this or any previous visit (from the past 48 hour(s)). No results found.  VITALS: Blood pressure 135/70, pulse 74, temperature 97.9 F (36.6 C), temperature source Oral, resp. rate 12, height 5' 8.5" (1.74 m), weight 236 lb (107 kg), SpO2 98 %.  EXAM:  General: Alert, oriented x3, no distress Head: no evidence  of trauma, PERRL, EOMI, no exophtalmos or lid lag, no myxedema, no xanthelasma; normal ears, nose and oropharynx Neck: normal jugular venous pulsations and no hepatojugular reflux; brisk carotid pulses without delay and no carotid bruits Chest: clear to auscultation, no signs of consolidation by percussion or palpation, normal fremitus, symmetrical and full respiratory excursions Cardiovascular: normal position and quality of the apical impulse, regular rhythm, normal first heart sound and second heart sounds, no rubs or gallops, 2/6 systolic murmur Abdomen: no tenderness or distention, no masses by palpation, no abnormal pulsatility or arterial bruits, normal bowel sounds, no hepatosplenomegaly Extremities: no clubbing, cyanosis or edema; 2+ radial, ulnar and brachial pulses bilaterally; 2+ right femoral, posterior tibial and dorsalis pedis pulses; 2+ left femoral, posterior tibial and dorsalis pedis pulses; no subclavian or femoral bruits Neurological: grossly nonfocal   IMPRESSION: Plan TEE for better evaluation of mitral and aortic valve disease.  PLAN: This procedure has been fully reviewed with the patient and written informed consent has been obtained.   Sanda Klein, MD, [facility] [phone] office [phone] pager  12/12/2015, 8:21 AM

## 2015-12-12 NOTE — Op Note (Signed)
INDICATIONS: aortic and mitral stenosis.  PROCEDURE:   Informed consent was obtained prior to the procedure. The risks, benefits and alternatives for the procedure were discussed and the patient comprehended these risks.  Risks include, but are not limited to, cough, sore throat, vomiting, nausea, somnolence, esophageal and stomach trauma or perforation, bleeding, low blood pressure, aspiration, pneumonia, infection, trauma to the teeth and death.    After a procedural time-out, the oropharynx was anesthetized with 20% benzocaine spray.   During this procedure the patient was administered a total of Versed 4 mg and Fentanyl 50 mcg to achieve and maintain moderate conscious sedation.  The patient's heart rate, blood pressure, and oxygen saturationweare monitored continuously during the procedure. The period of conscious sedation was 18 minutes, of which I was present face-to-face 100% of this time.  The transesophageal probe was inserted in the esophagus and stomach without difficulty and multiple views were obtained.  The patient was kept under observation until the patient left the procedure room.  The patient left the procedure room in stable condition.   Agitated microbubble saline contrast was not administered.  COMPLICATIONS:    There were no immediate complications.  FINDINGS:  Moderate aortic stenosis. Trileaflet valve. Moderate aortic insufficiency. Moderate mitral stenosis due to MAC and leaflet calcification. Mild to moderate MR. Normal LVEF. Normal aorta. Tiny ASD/PFO with exclusively L to R shunt.  RECOMMENDATIONS:   F/U with Dr. Angelena Form.  Time Spent Directly with the Patient:  45 minutes   [name] 12/12/2015, 8:43 AM

## 2015-12-15 ENCOUNTER — Other Ambulatory Visit: Payer: Self-pay | Admitting: *Deleted

## 2015-12-15 ENCOUNTER — Encounter (HOSPITAL_COMMUNITY): Payer: Self-pay | Admitting: Cardiovascular Disease

## 2015-12-15 DIAGNOSIS — I350 Nonrheumatic aortic (valve) stenosis: Secondary | ICD-10-CM

## 2015-12-15 DIAGNOSIS — I342 Nonrheumatic mitral (valve) stenosis: Secondary | ICD-10-CM

## 2016-01-07 ENCOUNTER — Telehealth: Payer: Self-pay | Admitting: Family Medicine

## 2016-01-07 NOTE — Telephone Encounter (Signed)
Spoke with pt- he was made aware of recommendations. Victorino December

## 2016-01-07 NOTE — Telephone Encounter (Signed)
Pt said Dr Redmond School mentioned that he may need to see a urologist at his last appointment. Pt wants to know the name of the urologist that Dr Redmond School would suggest that he sees.

## 2016-01-07 NOTE — Telephone Encounter (Signed)
I don't see that we discussed anything in particular but any of the urologists in that practice is fine.

## 2016-02-06 ENCOUNTER — Encounter: Payer: Medicare Other | Admitting: Family Medicine

## 2016-02-19 ENCOUNTER — Ambulatory Visit (INDEPENDENT_AMBULATORY_CARE_PROVIDER_SITE_OTHER): Payer: Medicare HMO | Admitting: Family Medicine

## 2016-02-19 ENCOUNTER — Encounter: Payer: Self-pay | Admitting: Family Medicine

## 2016-02-19 VITALS — BP 116/70 | HR 78 | Wt 230.0 lb

## 2016-02-19 DIAGNOSIS — R972 Elevated prostate specific antigen [PSA]: Secondary | ICD-10-CM | POA: Diagnosis not present

## 2016-02-19 NOTE — Progress Notes (Signed)
   Subjective:    Patient ID: [name], male    DOB: Apr 18, 1950, 66 y.o.   MRN: [id number]  HPI He is here for repeat PSA. His previous test was 6.2 with 13% free. Also of note is the fact that he and his wife are now back together.  Review of Systems     Objective:   Physical Exam Alert and in no distress otherwise not examined       Assessment & Plan:  Elevated PSA - Plan: PSA, total and free Guss possible referral to urology. If his numbers are still same, referral I think would be appropriate.

## 2016-02-20 LAB — PSA, TOTAL AND FREE
PSA, % FREE: 11 % — AB (ref >25)
PSA, Free: 0.5 ng/mL
PSA, TOTAL: 4.5 ng/mL — AB (ref <=4.0)

## 2016-02-20 NOTE — Addendum Note (Signed)
Addended by: Denita Lung on: 02/20/2016 03:22 PM   Modules accepted: Orders

## 2016-07-07 ENCOUNTER — Other Ambulatory Visit: Payer: Medicare HMO

## 2016-07-07 DIAGNOSIS — R972 Elevated prostate specific antigen [PSA]: Secondary | ICD-10-CM

## 2016-07-08 LAB — PSA: PSA: 3.1 ng/mL (ref <=4.0)

## 2016-07-08 NOTE — Progress Notes (Signed)
I relayed the PSA information to the patient. Recommend checking it in 6-12 months.

## 2016-08-27 ENCOUNTER — Telehealth: Payer: Self-pay | Admitting: Cardiovascular Disease

## 2016-08-27 NOTE — Telephone Encounter (Signed)
Pt is due for echo and appt this fall.  I spoke with him and scheduled echo for 12/14/16 and appt with Dr. Angelena Form on 12/17/16

## 2016-08-27 NOTE — Telephone Encounter (Signed)
°  Follow Up ° °Returning call from yesterday. Please call. °

## 2016-12-14 ENCOUNTER — Ambulatory Visit (HOSPITAL_COMMUNITY): Payer: Medicare HMO | Attending: Cardiovascular Disease

## 2016-12-14 ENCOUNTER — Other Ambulatory Visit: Payer: Self-pay

## 2016-12-14 DIAGNOSIS — I420 Dilated cardiomyopathy: Secondary | ICD-10-CM | POA: Insufficient documentation

## 2016-12-14 DIAGNOSIS — I080 Rheumatic disorders of both mitral and aortic valves: Secondary | ICD-10-CM | POA: Diagnosis not present

## 2016-12-14 DIAGNOSIS — I350 Nonrheumatic aortic (valve) stenosis: Secondary | ICD-10-CM | POA: Diagnosis not present

## 2016-12-14 DIAGNOSIS — I342 Nonrheumatic mitral (valve) stenosis: Secondary | ICD-10-CM | POA: Diagnosis not present

## 2016-12-16 NOTE — Progress Notes (Signed)
Chief Complaint  Patient presents with  . Aortic Stenosis   History of Present Illness: 66 yo male with history of aortic stenosis, mitral stenosis, tobacco abuse, hyperlipidemia, esophageal stricture here today for cardiac follow up. He is known to have moderate aortic stenosis, moderate aortic regurgitation and moderate mitral regurgitation.  Most recent echo 12/14/16 with normal LV size and function, LVEF=60-65%. Moderate LVH. Moderate aortic stenosis (mean gradient 35 mmHg, peak gradient 63 mmHg, AVA 1.21 cm2), moderate AI, moderate MS.   He is here today for follow up. The patient denies any chest pain, dyspnea, palpitations, lower extremity edema, orthopnea, PND, dizziness, near syncope or syncope. He continues to smoke several cigarettes per day.  .  Primary Care Physician: Denita Lung, MD  Past Medical History:  Diagnosis Date  . BCE (basal cell epithelioma), arm    RIGHT SHOULDER  . Dyslipidemia   . Esophageal stricture   . Hyperlipidemia   . Inguinal hernia    bilateral  . Obesity   . Smoker     Past Surgical History:  Procedure Laterality Date  . COLONOSCOPY  03/2008,2004   Dr. Amedeo Plenty  . HERNIA REPAIR  12/11/10   BIH  . THYMECTOMY  1976    Current Outpatient Medications  Medication Sig Dispense Refill  . aspirin 81 MG tablet Take 81 mg by mouth once. Pt takes every other day    . Testosterone (AXIRON) 30 MG/ACT SOLN Place 4 application onto the skin daily. 180 mL 5   No current facility-administered medications for this visit.     No Known Allergies  Social History   Socioeconomic History  . Marital status: Married    Spouse name: Not on file  . Number of children: 0  . Years of education: Not on file  . Highest education level: Not on file  Social Needs  . Financial resource strain: Not on file  . Food insecurity - worry: Not on file  . Food insecurity - inability: Not on file  . Transportation needs - medical: Not on file  . Transportation  needs - non-medical: Not on file  Occupational History  . Occupation: Programme researcher, broadcasting/film/video: [employer]  Tobacco Use  . Smoking status: Current Every Day Smoker    Packs/day: 0.25    Years: 35.00    Pack years: 8.75    Types: Cigarettes  . Smokeless tobacco: Never Used  Substance and Sexual Activity  . Alcohol use: Yes    Alcohol/week: 0.6 oz    Types: 1 Cans of beer per week  . Drug use: No  . Sexual activity: Yes  Other Topics Concern  . Not on file  Social History Narrative  . Not on file    Family History  Problem Relation Age of Onset  . Cancer Mother 87       colon  . Cancer Father 74       lung  . CAD Neg Hx     Review of Systems:  As stated in the HPI and otherwise negative.   BP 110/72   Pulse 79   Ht 5' 8.5" (1.74 m)   Wt 235 lb 3.2 oz (106.7 kg)   SpO2 94%   BMI 35.24 kg/m   Physical Examination:  General: Well developed, well nourished, NAD  HEENT: OP clear, mucus membranes moist  SKIN: warm, dry. No rashes. Neuro: No focal deficits  Musculoskeletal: Muscle strength 5/5 all ext  Psychiatric: Mood and affect normal  Neck:  No JVD, no carotid bruits, no thyromegaly, no lymphadenopathy.  Lungs:Clear bilaterally, no wheezes, rhonci, crackles Cardiovascular: Regular rate and rhythm. Loud, harsh, late peaking systolic murmur noted at RUSB. No diastolic murmur noted.  Abdomen:Soft. Bowel sounds present. Non-tender.  Extremities: No lower extremity edema. Pulses are 2 + in the bilateral DP/PT.  Echo 12/14/16: Left ventricle: The cavity size was normal. There was moderate   concentric hypertrophy. Systolic function was normal. The   estimated ejection fraction was in the range of 60% to 65%. Wall   motion was normal; there were no regional wall motion   abnormalities. The study is not technically sufficient to allow   evaluation of LV diastolic function. - Aortic valve: Right coronary cusp mobility was moderately   restricted. There was moderate  stenosis. There was moderate   regurgitation. Valve area (VTI): 1.21 cm^2. Valve area (Vmax):   1.04 cm^2. Valve area (Vmean): 1.11 cm^2. - Mitral valve: Moderately to severely calcified annulus. Mobility   of the anterior and posterior leaflet was moderately restricted.   Transvalvular velocity was increased. The findings are consistent   with moderate stenosis. There was mild regurgitation. Valve area   by pressure half-time: 1.5 cm^2. Valve area by continuity   equation (using LVOT flow): 1.6 cm^2. - Left atrium: The atrium was moderately dilated. - Pulmonary arteries: Systolic pressure was mildly increased. PA   peak pressure: 34 mm Hg (S).  Impressions:  - Gradients across both the aortic and mitral valve are higher than   expected based on valve morphology and calculated valve areas.   This suggests high cardiac output (consider anemia, infection,   thyrotoxicosis, etc.).  EKG:  EKG is  ordered today. The ekg ordered today demonstrates NSR, rate 79 bpm.   Recent Labs: No results found for requested labs within last 8760 hours.   Lipid Panel    Component Value Date/Time   CHOL 215 (H) 03/25/2015 0001   TRIG 148 03/25/2015 0001   HDL 51 03/25/2015 0001   CHOLHDL 4.2 03/25/2015 0001   VLDL 30 03/25/2015 0001   LDLCALC 134 (H) 03/25/2015 0001     Wt Readings from Last 3 Encounters:  12/17/16 235 lb 3.2 oz (106.7 kg)  02/19/16 230 lb (104.3 kg)  12/12/15 236 lb (107 kg)     Other studies Reviewed: Additional studies/ records that were reviewed today include: . Review of the above records demonstrates:   Assessment and Plan:   1. Aortic stenosis: Moderate AS with moderate AI by echo 12/14/16. He remains asymptomatic.  I think we may be getting close to the need for a surgical evaluation. I will discuss with the valve team this week.   2. Mitral valve stenosis: Moderate MS by echo 12/14/16 with severe MAC. No symptoms.   3. Tobacco abuse: I have asked him to stop  smoking completely.   Current medicines are reviewed at length with the patient today.  The patient does not have concerns regarding medicines.  The following changes have been made:  no change  Labs/ tests ordered today include:   No orders of the defined types were placed in this encounter.   Disposition:   FU with me in 6 months  Signed, Lauree Chandler, MD 12/17/2016 9:31 AM    [facility] [place], [place], [place]  [address] Phone: [phone]; Fax: [phone]

## 2016-12-17 ENCOUNTER — Ambulatory Visit: Payer: Medicare HMO | Admitting: Cardiovascular Disease

## 2016-12-17 ENCOUNTER — Encounter: Payer: Self-pay | Admitting: Cardiovascular Disease

## 2016-12-17 VITALS — BP 110/72 | HR 79 | Ht 68.5 in | Wt 235.2 lb

## 2016-12-17 DIAGNOSIS — I050 Rheumatic mitral stenosis: Secondary | ICD-10-CM

## 2016-12-17 DIAGNOSIS — I350 Nonrheumatic aortic (valve) stenosis: Secondary | ICD-10-CM

## 2016-12-17 NOTE — Patient Instructions (Signed)

## 2016-12-21 NOTE — Addendum Note (Signed)
Addended by: Mendel Ryder on: 12/21/2016 05:07 PM   Modules accepted: Orders

## 2017-04-26 ENCOUNTER — Encounter: Payer: Self-pay | Admitting: Family Medicine

## 2017-04-26 ENCOUNTER — Ambulatory Visit (INDEPENDENT_AMBULATORY_CARE_PROVIDER_SITE_OTHER): Payer: Medicare HMO | Admitting: Family Medicine

## 2017-04-26 VITALS — BP 118/74 | HR 82 | Ht 69.0 in | Wt 232.4 lb

## 2017-04-26 DIAGNOSIS — Z720 Tobacco use: Secondary | ICD-10-CM

## 2017-04-26 DIAGNOSIS — Z125 Encounter for screening for malignant neoplasm of prostate: Secondary | ICD-10-CM

## 2017-04-26 DIAGNOSIS — R972 Elevated prostate specific antigen [PSA]: Secondary | ICD-10-CM | POA: Diagnosis not present

## 2017-04-26 DIAGNOSIS — E669 Obesity, unspecified: Secondary | ICD-10-CM | POA: Diagnosis not present

## 2017-04-26 DIAGNOSIS — I050 Rheumatic mitral stenosis: Secondary | ICD-10-CM

## 2017-04-26 DIAGNOSIS — Z23 Encounter for immunization: Secondary | ICD-10-CM | POA: Diagnosis not present

## 2017-04-26 DIAGNOSIS — I350 Nonrheumatic aortic (valve) stenosis: Secondary | ICD-10-CM | POA: Diagnosis not present

## 2017-04-26 DIAGNOSIS — I342 Nonrheumatic mitral (valve) stenosis: Secondary | ICD-10-CM

## 2017-04-26 DIAGNOSIS — Z8719 Personal history of other diseases of the digestive system: Secondary | ICD-10-CM | POA: Diagnosis not present

## 2017-04-26 DIAGNOSIS — E291 Testicular hypofunction: Secondary | ICD-10-CM | POA: Diagnosis not present

## 2017-04-26 NOTE — Patient Instructions (Signed)
  Mr. Teeple , Thank you for taking time to come for your Medicare Wellness Visit. I appreciate your ongoing commitment to your health goals. Please review the following plan we discussed and let me know if I can assist you in the future.   These are the goals we discussed: Goals    None      This is a list of the screening recommended for you and due dates:  Health Maintenance  Topic Date Due  . Flu Shot  09/08/2016  . Pneumonia vaccines (2 of 2 - PPSV23) 11/04/2016  . Tetanus Vaccine  12/26/2021  . Colon Cancer Screening  06/02/2023  .  Hepatitis C: One time screening is recommended by Center for Disease Control  (CDC) for  adults born from [birth year] through 1965.   Completed

## 2017-04-26 NOTE — Progress Notes (Signed)
[name] is a 67 y.o. male who presents for annual wellness visit and follow-up on chronic medical conditions.  He has the following concerns: He is followed by cardiology for his underlying AS and MS.  At this time he is not having any chest pain, shortness of breath, syncopal episodes.  He is not on testosterone.  His PSA did go up when he was on that and therefore he stopped it.  He does continue to smoke but has limited his exposure.  He has had no chest pain, indigestion or feeling of food getting stuck.  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   Immunizations and Health Maintenance Immunization History  Administered Date(s) Administered  . DTaP 10/31/1995  . Influenza Split 10/23/2010, 12/27/2011  . Influenza, High Dose Seasonal PF 11/05/2015  . Influenza,inj,Quad PF,6+ Mos 12/17/2013  . PPD Test 11/05/2015  . Pneumococcal Conjugate-13 11/05/2015  . Pneumococcal Polysaccharide-23 10/23/2010  . Tdap 12/27/2011  . Zoster 10/23/2010   Health Maintenance Due  Topic Date Due  . INFLUENZA VACCINE  09/08/2016  . PNA vac Low Risk Adult (2 of 2 - PPSV23) 11/04/2016    Last colonoscopy:over three or four years Last PSA: last year Dentist:jan. 2019 Ophtho:over a year  Exercise: walking 45 min a day at work  Other doctors caring for patient include: Lexicographer Directives: No.  Information given.    Depression screen:  See questionnaire below.     Depression screen [facility] 2/9 04/26/2017 11/05/2015 12/27/2011  Decreased Interest 0 0 0  Down, Depressed, Hopeless 0 0 0  PHQ - 2 Score 0 0 0    Fall Screen: See Questionaire below.   Fall Risk  04/26/2017 11/05/2015 12/27/2011  Falls in the past year? No No No    ADL screen:  See questionnaire below.  Functional Status Survey:     Review of Systems  Constitutional: -, -unexpected weight change, -anorexia, -fatigue Allergy: -sneezing, -itching, -congestion Dermatology: denies changing  moles, rash, lumps ENT: -runny nose, -ear pain, -sore throat,  Cardiology:  -chest pain, -palpitations, -orthopnea, Respiratory: -cough, -shortness of breath, -dyspnea on exertion, -wheezing,  Gastroenterology: -abdominal pain, -nausea, -vomiting, -diarrhea, -constipation, -dysphagia Hematology: -bleeding or bruising problems Musculoskeletal: -arthralgias, -myalgias, -joint swelling, -back pain, - Ophthalmology: -vision changes,  Urology: -dysuria, -difficulty urinating,  -urinary frequency, -urgency, incontinence Neurology: -, -numbness, , -memory loss, -falls, -dizziness    PHYSICAL EXAM:   General Appearance: Alert, cooperative, no distress, appears stated age Head: Normocephalic, without obvious abnormality, atraumatic Eyes: PERRL, conjunctiva/corneas clear, EOM's intact, fundi benign Ears: Normal TM's and external ear canals Nose: Nares normal, mucosa normal, no drainage or sinus   tenderness Throat: Lips, mucosa, and tongue normal; teeth and gums normal Neck: Supple, no lymphadenopathy, thyroid:no enlargement/tenderness/nodules; no carotid bruit or JVD Lungs: Clear to auscultation bilaterally without wheezes, rales or ronchi; respirations unlabored Heart: Regular rate and rhythm, S1 and S2 normal, 4/6 SEM at left sternal border radiating into the neck Abdomen: Soft, non-tender, nondistended, normoactive bowel sounds, no masses, no hepatosplenomegaly Extremities: No clubbing, cyanosis or edema Pulses: 2+ and symmetric all extremities Skin: Skin color, texture, turgor normal, no rashes or lesions Lymph nodes: Cervical, supraclavicular, and axillary nodes normal Neurologic: CNII-XII intact, normal strength, sensation and gait; reflexes 2+ and symmetric throughout   Psych: Normal mood, affect, hygiene and grooming  ASSESSMENT/PLAN: Elevated PSA - Plan: PSA, total and free  Obesity (BMI 30-39.9) - Plan: CBC with Differential/Platelet, Comprehensive metabolic panel,  Lipid  panel  Nonrheumatic aortic valve stenosis - Plan: CBC with Differential/Platelet, Comprehensive metabolic panel, Lipid panel  Mitral valve stenosis, unspecified etiology - Plan: CBC with Differential/Platelet, Comprehensive metabolic panel, Lipid panel  Screening for prostate cancer - Plan: PSA, total and free  Need for influenza vaccination - Plan: Flu vaccine HIGH DOSE PF (Fluzone High dose)  Current occasional smoker  History of esophageal stricture  Hypogonadism in male  Non-rheumatic mitral valve stenosis I discussed continued followed up concerning his cardiac issue.  Again strongly encouraged him to quit smoking.  Complemented him on continuing to work to keep himself physically active.  Information concerning advanced directive given.     Medicare Attestation I have personally reviewed: The patient's medical and social history Their use of alcohol, tobacco or illicit drugs Their current medications and supplements The patient's functional ability including ADLs,fall risks, home safety risks, cognitive, and hearing and visual impairment Diet and physical activities Evidence for depression or mood disorders  The patient's weight, height, and BMI have been recorded in the chart.  I have made referrals, counseling, and provided education to the patient based on review of the above and I have provided the patient with a written personalized care plan for preventive services.     Jill Alexanders, MD   04/26/2017

## 2017-04-27 LAB — COMPREHENSIVE METABOLIC PANEL
ALBUMIN: 4.5 g/dL (ref 3.6–4.8)
ALT: 15 IU/L (ref 0–44)
AST: 16 IU/L (ref 0–40)
Albumin/Globulin Ratio: 1.7 (ref 1.2–2.2)
Alkaline Phosphatase: 64 IU/L (ref 39–117)
BUN / CREAT RATIO: 17 (ref 10–24)
BUN: 15 mg/dL (ref 8–27)
Bilirubin Total: 0.5 mg/dL (ref 0.0–1.2)
CO2: 24 mmol/L (ref 20–29)
CREATININE: 0.89 mg/dL (ref 0.76–1.27)
Calcium: 9.6 mg/dL (ref 8.6–10.2)
Chloride: 103 mmol/L (ref 96–106)
GFR calc non Af Amer: 89 mL/min/{1.73_m2} (ref >59)
GFR, EST AFRICAN AMERICAN: 103 mL/min/{1.73_m2} (ref >59)
GLUCOSE: 95 mg/dL (ref 65–99)
Globulin, Total: 2.7 g/dL (ref 1.5–4.5)
Potassium: 4.7 mmol/L (ref 3.5–5.2)
Sodium: 141 mmol/L (ref 134–144)
TOTAL PROTEIN: 7.2 g/dL (ref 6.0–8.5)

## 2017-04-27 LAB — CBC WITH DIFFERENTIAL/PLATELET
BASOS ABS: 0 10*3/uL (ref 0.0–0.2)
Basos: 0 %
EOS (ABSOLUTE): 0.3 10*3/uL (ref 0.0–0.4)
EOS: 4 %
HEMOGLOBIN: 14.6 g/dL (ref 13.0–17.7)
Hematocrit: 44 % (ref 37.5–51.0)
IMMATURE GRANS (ABS): 0 10*3/uL (ref 0.0–0.1)
Immature Granulocytes: 0 %
LYMPHS: 24 %
Lymphocytes Absolute: 1.7 10*3/uL (ref 0.7–3.1)
MCH: 28.7 pg (ref 26.6–33.0)
MCHC: 33.2 g/dL (ref 31.5–35.7)
MCV: 87 fL (ref 79–97)
MONOCYTES: 9 %
Monocytes Absolute: 0.6 10*3/uL (ref 0.1–0.9)
NEUTROS ABS: 4.5 10*3/uL (ref 1.4–7.0)
Neutrophils: 63 %
Platelets: 243 10*3/uL (ref 150–379)
RBC: 5.08 x10E6/uL (ref 4.14–5.80)
RDW: 14.4 % (ref 12.3–15.4)
WBC: 7.1 10*3/uL (ref 3.4–10.8)

## 2017-04-27 LAB — PSA, TOTAL AND FREE
PSA FREE PCT: 9.8 %
PSA, Free: 0.46 ng/mL
Prostate Specific Ag, Serum: 4.7 ng/mL — ABNORMAL HIGH (ref 0.0–4.0)

## 2017-04-27 LAB — LIPID PANEL
Chol/HDL Ratio: 3.8 ratio (ref 0.0–5.0)
Cholesterol, Total: 240 mg/dL — ABNORMAL HIGH (ref 100–199)
HDL: 63 mg/dL (ref >39)
LDL CALC: 145 mg/dL — AB (ref 0–99)
Triglycerides: 158 mg/dL — ABNORMAL HIGH (ref 0–149)
VLDL CHOLESTEROL CAL: 32 mg/dL (ref 5–40)

## 2017-06-14 NOTE — Progress Notes (Signed)
Cardiology Office Note    Date:  06/15/2017   ID:  Amjad, Fikes 07-25-1950, MRN [id number]  PCP:  Denita Lung, MD  Cardiologist: Lauree Chandler, MD  No chief complaint on file.   History of Present Illness:  [name] is a 67 y.o. male with history of moderate Aortic stenosis & mitral stenosis. Last echo 12/2016 normal LVEF 60-65%, moderate LVH, moderate AS mean gradient 35 mmHg, peak 63 mmHg, AVA 1.21 cm2, moderate AI, moderate MS. Also has HLD, tobacco abuse and esophageal stricture.Last saw Dr. Angelena Form 12/2016 at which time he felt he was getting close to needing valve replacement.  Patient comes in today for six-month follow-up.  He says he gets a little short of breath when going up hills but thinks it is due to deconditioning and has not changed much in the past 6 months.  He quit smoking a couple weeks ago.  Labs drawn in March LDL was 145 cholesterol 240 triglycerides 158.  He does not want to take any lipid-lowering agents.    Past Medical History:  Diagnosis Date  . BCE (basal cell epithelioma), arm    RIGHT SHOULDER  . Dyslipidemia   . Esophageal stricture   . Hyperlipidemia   . Inguinal hernia    bilateral  . Obesity   . Smoker     Past Surgical History:  Procedure Laterality Date  . COLONOSCOPY  03/2008,2004   Dr. Amedeo Plenty  . HERNIA REPAIR  12/11/10   BIH  . TEE WITHOUT CARDIOVERSION N/A 12/12/2015   Procedure: TRANSESOPHAGEAL ECHOCARDIOGRAM (TEE);  Surgeon: Sanda Klein, MD;  Location: [facility] ENDOSCOPY;  Service: Cardiovascular;  Laterality: N/A;  . THYMECTOMY  1976    Current Medications: Current Meds  Medication Sig  . aspirin 81 MG tablet Take 81 mg by mouth once. Pt takes every other day     Allergies:   Patient has no known allergies.   Social History   Socioeconomic History  . Marital status: Married    Spouse name: Not on file  . Number of children: 0  . Years of education: Not on file  . Highest education level: Not on  file  Occupational History  . Occupation: Programme researcher, broadcasting/film/video: [employer]  Social Needs  . Financial resource strain: Not on file  . Food insecurity:    Worry: Not on file    Inability: Not on file  . Transportation needs:    Medical: Not on file    Non-medical: Not on file  Tobacco Use  . Smoking status: Current Every Day Smoker    Packs/day: 0.25    Years: 35.00    Pack years: 8.75    Types: Cigarettes  . Smokeless tobacco: Never Used  Substance and Sexual Activity  . Alcohol use: Yes    Alcohol/week: 0.6 oz    Types: 1 Cans of beer per week  . Drug use: No  . Sexual activity: Yes  Lifestyle  . Physical activity:    Days per week: Not on file    Minutes per session: Not on file  . Stress: Not on file  Relationships  . Social connections:    Talks on phone: Not on file    Gets together: Not on file    Attends religious service: Not on file    Active member of club or organization: Not on file    Attends meetings of clubs or organizations: Not on file    Relationship status: Not  on file  Other Topics Concern  . Not on file  Social History Narrative  . Not on file     Family History:  The patient's family history includes Cancer (age of onset: 63) in his father; Cancer (age of onset: 57) in his mother.   ROS:   Please see the history of present illness.    Review of Systems  Constitution: Positive for weight gain.  HENT: Negative.   Cardiovascular: Positive for dyspnea on exertion.  Respiratory: Negative.   Endocrine: Negative.   Hematologic/Lymphatic: Negative.   Musculoskeletal: Negative.   Gastrointestinal: Negative.   Genitourinary: Negative.   Neurological: Negative.    All other systems reviewed and are negative.   PHYSICAL EXAM:   VS:  BP 110/78   Pulse 85   Ht 5\' 9"  (1.753 m)   Wt 237 lb 12.8 oz (107.9 kg)   SpO2 95%   BMI 35.12 kg/m   Physical Exam  GEN: Obese, in no acute distress  Neck: no JVD, carotid bruits, or  masses Cardiac:RRR; 3-4 over 6 harsh systolic murmur at the left sternal border and apex no diastolic murmur noted. respiratory: Decreased breath sounds but clear to auscultation bilaterally, normal work of breathing GI: soft, nontender, nondistended, + BS Ext: without cyanosis, clubbing, or edema, Good distal pulses bilaterally Neuro:  Alert and Oriented x 3 Psych: euthymic mood, full affect  Wt Readings from Last 3 Encounters:  06/15/17 237 lb 12.8 oz (107.9 kg)  04/26/17 232 lb 6.4 oz (105.4 kg)  12/17/16 235 lb 3.2 oz (106.7 kg)      Studies/Labs Reviewed:   EKG:  EKG is not ordered today.   Recent Labs: 04/26/2017: ALT 15; BUN 15; Creatinine, Ser 0.89; Hemoglobin 14.6; Platelets 243; Potassium 4.7; Sodium 141   Lipid Panel    Component Value Date/Time   CHOL 240 (H) 04/26/2017 1207   TRIG 158 (H) 04/26/2017 1207   HDL 63 04/26/2017 1207   CHOLHDL 3.8 04/26/2017 1207   CHOLHDL 4.2 03/25/2015 0001   VLDL 30 03/25/2015 0001   LDLCALC 145 (H) 04/26/2017 1207    Additional studies/ records that were reviewed today include:    Echo 12/14/16: Left ventricle: The cavity size was normal. There was moderate   concentric hypertrophy. Systolic function was normal. The   estimated ejection fraction was in the range of 60% to 65%. Wall   motion was normal; there were no regional wall motion   abnormalities. The study is not technically sufficient to allow   evaluation of LV diastolic function. - Aortic valve: Right coronary cusp mobility was moderately   restricted. There was moderate stenosis. There was moderate   regurgitation. Valve area (VTI): 1.21 cm^2. Valve area (Vmax):   1.04 cm^2. Valve area (Vmean): 1.11 cm^2. - Mitral valve: Moderately to severely calcified annulus. Mobility   of the anterior and posterior leaflet was moderately restricted.   Transvalvular velocity was increased. The findings are consistent   with moderate stenosis. There was mild regurgitation.  Valve area   by pressure half-time: 1.5 cm^2. Valve area by continuity   equation (using LVOT flow): 1.6 cm^2. - Left atrium: The atrium was moderately dilated. - Pulmonary arteries: Systolic pressure was mildly increased. PA   peak pressure: 34 mm Hg (S).   Impressions:   - Gradients across both the aortic and mitral valve are higher than   expected based on valve morphology and calculated valve areas.   This suggests high cardiac output (consider anemia,  infection,   thyrotoxicosis, etc.).     ASSESSMENT:    1. Aortic valve stenosis, etiology of cardiac valve disease unspecified   2. Mitral valve stenosis, unspecified etiology   3. Obesity (BMI 30-39.9)   4. Current occasional smoker      PLAN:  In order of problems listed above:  Moderate AS on echo 12/2016 has some dyspnea on exertion when going uphill.  Will discuss with Dr. Angelena Form whether or not he needs an echo to reassess now or if he can wait until November with his yearly follow-up.  Moderate MS nearing need for valve replacements will discuss with Dr. Angelena Form.  Obesity weight loss program such as weight watchers recommended.  Tobacco abuse patient quit smoking 3 weeks ago and is commended for this.  Mixed hyperlipidemia on recent lipid profile 04/2017.  LDL 145.  Recommended trying a lipid-lowering agent but patient reluctant and wants to try diet and weight loss.  Will need follow-up labs next office visit.     Medication Adjustments/Labs and Tests Ordered: Current medicines are reviewed at length with the patient today.  Concerns regarding medicines are outlined above.  Medication changes, Labs and Tests ordered today are listed in the Patient Instructions below. There are no Patient Instructions on file for this visit.   Sumner Boast, PA-C  06/15/2017 10:03 AM    [facility] [address], [place], [place]   [address] Phone: [phone]; Fax: [phone]

## 2017-06-15 ENCOUNTER — Encounter: Payer: Self-pay | Admitting: Physician Assistant

## 2017-06-15 ENCOUNTER — Ambulatory Visit: Payer: Medicare HMO | Admitting: Physician Assistant

## 2017-06-15 VITALS — BP 110/78 | HR 85 | Ht 69.0 in | Wt 237.8 lb

## 2017-06-15 DIAGNOSIS — I050 Rheumatic mitral stenosis: Secondary | ICD-10-CM

## 2017-06-15 DIAGNOSIS — E669 Obesity, unspecified: Secondary | ICD-10-CM | POA: Diagnosis not present

## 2017-06-15 DIAGNOSIS — I350 Nonrheumatic aortic (valve) stenosis: Secondary | ICD-10-CM

## 2017-06-15 DIAGNOSIS — E782 Mixed hyperlipidemia: Secondary | ICD-10-CM | POA: Diagnosis not present

## 2017-06-15 DIAGNOSIS — Z720 Tobacco use: Secondary | ICD-10-CM | POA: Diagnosis not present

## 2017-06-15 NOTE — Patient Instructions (Signed)
Medication Instructions:  Your physician recommends that you continue on your current medications as directed. Please refer to the Current Medication list given to you today.  Labwork: None ordered  Testing/Procedures: Your physician has requested that you have an echocardiogram in November -- before your follow up with Dr. Angelena Form. Echocardiography is a painless test that uses sound waves to create images of your heart. It provides your doctor with information about the size and shape of your heart and how well your heart's chambers and valves are working. This procedure takes approximately one hour. There are no restrictions for this procedure.  Follow-Up: Your physician wants you to follow-up in: November with Dr. Angelena Form -- after your have completed your echocardiogram. You will receive a reminder letter in the mail two months in advance. If you don't receive a letter, please call our office to schedule the follow-up appointment.  * If you need a refill on your cardiac medications before your next appointment, please call your pharmacy.   *Please note that any paperwork needing to be filled out by the provider will need to be addressed at the front desk prior to seeing the provider. Please note that any FMLA, disability or other documents regarding health condition is subject to a $25.00 charge that must be received prior to completion of paperwork in the form of a money order or check.  Thank you for choosing [facility]!!    Any Other Special Instructions Will Be Listed Below (If Applicable).  We recommend that you start a weight loss program.  We recommend that you follow a heart healthy diet   Heart-Healthy Eating Plan Many factors influence your heart health, including eating and exercise habits. Heart (coronary) risk increases with abnormal blood fat (lipid) levels. Heart-healthy meal planning includes limiting unhealthy fats, increasing healthy fats, and making other small  dietary changes. This includes maintaining a healthy body weight to help keep lipid levels within a normal range. What is my plan? Your health care provider recommends that you:  Get no more than _________% of the total calories in your daily diet from fat.  Limit your intake of saturated fat to less than _________% of your total calories each day.  Limit the amount of cholesterol in your diet to less than _________ mg per day.  What types of fat should I choose?  Choose healthy fats more often. Choose monounsaturated and polyunsaturated fats, such as olive oil and canola oil, flaxseeds, walnuts, almonds, and seeds.  Eat more omega-3 fats. Good choices include salmon, mackerel, sardines, tuna, flaxseed oil, and ground flaxseeds. Aim to eat fish at least two times each week.  Limit saturated fats. Saturated fats are primarily found in animal products, such as meats, butter, and cream. Plant sources of saturated fats include palm oil, palm kernel oil, and coconut oil.  Avoid foods with partially hydrogenated oils in them. These contain trans fats. Examples of foods that contain trans fats are stick margarine, some tub margarines, cookies, crackers, and other baked goods. What general guidelines do I need to follow?  Check food labels carefully to identify foods with trans fats or high amounts of saturated fat.  Fill one half of your plate with vegetables and green salads. Eat 4-5 servings of vegetables per day. A serving of vegetables equals 1 cup of raw leafy vegetables,  cup of raw or cooked cut-up vegetables, or  cup of vegetable juice.  Fill one fourth of your plate with whole grains. Look for the word "whole"  as the first word in the ingredient list.  Fill one fourth of your plate with lean protein foods.  Eat 4-5 servings of fruit per day. A serving of fruit equals one medium whole fruit,  cup of dried fruit,  cup of fresh, frozen, or canned fruit, or  cup of 100% fruit  juice.  Eat more foods that contain soluble fiber. Examples of foods that contain this type of fiber are apples, broccoli, carrots, beans, peas, and barley. Aim to get 20-30 g of fiber per day.  Eat more home-cooked food and less restaurant, buffet, and fast food.  Limit or avoid alcohol.  Limit foods that are high in starch and sugar.  Avoid fried foods.  Cook foods by using methods other than frying. Baking, boiling, grilling, and broiling are all great options. Other fat-reducing suggestions include: ? Removing the skin from poultry. ? Removing all visible fats from meats. ? Skimming the fat off of stews, soups, and gravies before serving them. ? Steaming vegetables in water or broth.  Lose weight if you are overweight. Losing just 5-10% of your initial body weight can help your overall health and prevent diseases such as diabetes and heart disease.  Increase your consumption of nuts, legumes, and seeds to 4-5 servings per week. One serving of dried beans or legumes equals  cup after being cooked, one serving of nuts equals 1 ounces, and one serving of seeds equals  ounce or 1 tablespoon.  You may need to monitor your salt (sodium) intake, especially if you have high blood pressure. Talk with your health care provider or dietitian to get more information about reducing sodium. What foods can I eat? Grains  Breads, including [place], white, pita, wheat, raisin, rye, oatmeal, and [place]. Tortillas that are neither fried nor made with lard or trans fat. Low-fat rolls, including hotdog and hamburger buns and English muffins. Biscuits. Muffins. Waffles. Pancakes. Light popcorn. Whole-grain cereals. Flatbread. Melba toast. Pretzels. Breadsticks. Rusks. Low-fat snacks and crackers, including oyster, saltine, matzo, graham, animal, and rye. Rice and pasta, including brown rice and those that are made with whole wheat. Vegetables All vegetables. Fruits All fruits, but limit coconut. Meats  and Other Protein Sources Lean, well-trimmed beef, veal, pork, and lamb. Chicken and [place] without skin. All fish and shellfish. Wild duck, rabbit, pheasant, and venison. Egg whites or low-cholesterol egg substitutes. Dried beans, peas, lentils, and tofu.Seeds and most nuts. Dairy Low-fat or nonfat cheeses, including ricotta, string, and mozzarella. Skim or 1% milk that is liquid, powdered, or evaporated. Buttermilk that is made with low-fat milk. Nonfat or low-fat yogurt. Beverages Mineral water. Diet carbonated beverages. Sweets and Desserts Sherbets and fruit ices. Honey, jam, marmalade, jelly, and syrups. Meringues and gelatins. Pure sugar candy, such as hard candy, jelly beans, gumdrops, mints, marshmallows, and small amounts of dark chocolate. [employer] Eat all sweets and desserts in moderation. Fats and Oils Nonhydrogenated (trans-free) margarines. Vegetable oils, including soybean, sesame, sunflower, olive, peanut, safflower, corn, canola, and cottonseed. Salad dressings or mayonnaise that are made with a vegetable oil. Limit added fats and oils that you use for cooking, baking, salads, and as spreads. Other Cocoa powder. Coffee and tea. All seasonings and condiments. The items listed above may not be a complete list of recommended foods or beverages. Contact your dietitian for more options. What foods are not recommended? Grains Breads that are made with saturated or trans fats, oils, or whole milk. Croissants. Butter rolls. Cheese breads. Sweet rolls. Donuts. Buttered  popcorn. Chow mein noodles. High-fat crackers, such as cheese or butter crackers. Meats and Other Protein Sources Fatty meats, such as hotdogs, short ribs, sausage, spareribs, bacon, ribeye roast or steak, and mutton. High-fat deli meats, such as salami and bologna. Caviar. Domestic duck and goose. Organ meats, such as kidney, liver, sweetbreads, brains, gizzard, chitterlings, and heart. Dairy Cream, sour cream,  cream cheese, and creamed cottage cheese. Whole milk cheeses, including blue (bleu), Monte[place] Jack, [place], [place], American, [employer], [place], [place], [place], and [place]. Whole or 2% milk that is liquid, evaporated, or condensed. Whole buttermilk. Cream sauce or high-fat cheese sauce. Yogurt that is made from whole milk. Beverages Regular sodas and drinks with added sugar. Sweets and Desserts Frosting. Pudding. Cookies. Cakes other than angel food cake. Candy that has milk chocolate or white chocolate, hydrogenated fat, butter, coconut, or unknown ingredients. Buttered syrups. Full-fat ice cream or ice cream drinks. Fats and Oils Gravy that has suet, meat fat, or shortening. Cocoa butter, hydrogenated oils, palm oil, coconut oil, palm kernel oil. These can often be found in baked products, candy, fried foods, nondairy creamers, and whipped toppings. Solid fats and shortenings, including bacon fat, salt pork, lard, and butter. Nondairy cream substitutes, such as coffee creamers and sour cream substitutes. Salad dressings that are made of unknown oils, cheese, or sour cream. The items listed above may not be a complete list of foods and beverages to avoid. Contact your dietitian for more information. This information is not intended to replace advice given to you by your health care provider. Make sure you discuss any questions you have with your health care provider. Document Released: 11/04/2007 Document Revised: 08/15/2015 Document Reviewed: 07/19/2013 Elsevier Interactive Patient Education  [employer].

## 2017-06-16 ENCOUNTER — Telehealth: Payer: Self-pay | Admitting: *Deleted

## 2017-06-16 NOTE — Telephone Encounter (Signed)
From: Burnell Blanks, MD  Sent: 06/15/2017  5:06 PM  To: Imogene Burn, PA-C, Thompson Grayer, RN   Sharyn Lull, since he is overall having no big change in symptoms, I would plan a f/u echo in November. Thanks for seeing him. Gerald Stabs   I spoke with pt. He will keep scheduled appointment on 12/23/17 for echo. I told him we would be in touch when Dr. Camillia Herter schedule for November was available

## 2017-12-23 ENCOUNTER — Other Ambulatory Visit (HOSPITAL_COMMUNITY): Payer: Medicare HMO

## 2017-12-27 ENCOUNTER — Ambulatory Visit (HOSPITAL_COMMUNITY): Payer: Medicare HMO | Attending: Cardiology

## 2017-12-27 ENCOUNTER — Other Ambulatory Visit: Payer: Self-pay

## 2017-12-27 ENCOUNTER — Telehealth: Payer: Self-pay

## 2017-12-27 DIAGNOSIS — I050 Rheumatic mitral stenosis: Secondary | ICD-10-CM | POA: Insufficient documentation

## 2017-12-27 DIAGNOSIS — I350 Nonrheumatic aortic (valve) stenosis: Secondary | ICD-10-CM

## 2017-12-27 NOTE — Telephone Encounter (Signed)
-----   Message from Imogene Burn, PA-C sent at 12/27/2017  3:46 PM EST ----- Echo shows normal LV function with some trouble with the heart relaxing.  Moderate to severe aortic valve stenosis but similar to last year.  Also some mitral valve disease.  Can discuss in detail with Dr. Angelena Form on Thursday.

## 2017-12-27 NOTE — Telephone Encounter (Signed)
Follow up  ° ° °Patient is returning your call. °

## 2017-12-27 NOTE — Telephone Encounter (Signed)
Left message for patient to call back  

## 2017-12-27 NOTE — Telephone Encounter (Signed)
The patient has been notified of the result and verbalized understanding.  All questions (if any) were answered. Cleon Gustin, RN 12/27/2017 4:50 PM

## 2017-12-29 ENCOUNTER — Ambulatory Visit: Payer: Medicare HMO | Admitting: Cardiovascular Disease

## 2017-12-29 ENCOUNTER — Encounter: Payer: Self-pay | Admitting: Cardiovascular Disease

## 2017-12-29 VITALS — BP 124/70 | HR 80 | Ht 69.0 in | Wt 234.4 lb

## 2017-12-29 DIAGNOSIS — I050 Rheumatic mitral stenosis: Secondary | ICD-10-CM

## 2017-12-29 DIAGNOSIS — I350 Nonrheumatic aortic (valve) stenosis: Secondary | ICD-10-CM | POA: Diagnosis not present

## 2017-12-29 NOTE — Progress Notes (Signed)
Chief Complaint  Patient presents with  . Follow-up    Aortic valve disease   History of Present Illness: 67 yo male with history of aortic stenosis, mitral stenosis, tobacco abuse, hyperlipidemia and esophageal stricture here today for cardiac follow up. He is known to have moderate aortic stenosis, moderate aortic regurgitation and moderate mitral regurgitation.  Most recent echo 12/14/16 with normal LV size and function, LVEF=60-65%. Moderate LVH. Moderate aortic stenosis (mean gradient 35 mmHg, peak gradient 63 mmHg, AVA 1.21 cm2), moderate AI, moderate MS.   He is here today for follow up. The patient denies any chest pain, dyspnea, palpitations, lower extremity edema, orthopnea, PND, dizziness, near syncope or syncope.  .  Primary Care Physician: Denita Lung, MD  Past Medical History:  Diagnosis Date  . BCE (basal cell epithelioma), arm    RIGHT SHOULDER  . Dyslipidemia   . Esophageal stricture   . Hyperlipidemia   . Inguinal hernia    bilateral  . Obesity   . Smoker     Past Surgical History:  Procedure Laterality Date  . COLONOSCOPY  03/2008,2004   Dr. Amedeo Plenty  . HERNIA REPAIR  12/11/10   BIH  . TEE WITHOUT CARDIOVERSION N/A 12/12/2015   Procedure: TRANSESOPHAGEAL ECHOCARDIOGRAM (TEE);  Surgeon: Sanda Klein, MD;  Location: [facility] ENDOSCOPY;  Service: Cardiovascular;  Laterality: N/A;  . THYMECTOMY  1976    Current Outpatient Medications  Medication Sig Dispense Refill  . aspirin 81 MG tablet Take 81 mg by mouth once. Pt takes every other day     No current facility-administered medications for this visit.     No Known Allergies  Social History   Socioeconomic History  . Marital status: Married    Spouse name: Not on file  . Number of children: 0  . Years of education: Not on file  . Highest education level: Not on file  Occupational History  . Occupation: Programme researcher, broadcasting/film/video: [employer]  Social Needs  . Financial resource strain: Not on file  .  Food insecurity:    Worry: Not on file    Inability: Not on file  . Transportation needs:    Medical: Not on file    Non-medical: Not on file  Tobacco Use  . Smoking status: Current Every Day Smoker    Packs/day: 0.25    Years: 35.00    Pack years: 8.75    Types: Cigarettes  . Smokeless tobacco: Never Used  Substance and Sexual Activity  . Alcohol use: Yes    Alcohol/week: 1.0 standard drinks    Types: 1 Cans of beer per week  . Drug use: No  . Sexual activity: Yes  Lifestyle  . Physical activity:    Days per week: Not on file    Minutes per session: Not on file  . Stress: Not on file  Relationships  . Social connections:    Talks on phone: Not on file    Gets together: Not on file    Attends religious service: Not on file    Active member of club or organization: Not on file    Attends meetings of clubs or organizations: Not on file    Relationship status: Not on file  . Intimate partner violence:    Fear of current or ex partner: Not on file    Emotionally abused: Not on file    Physically abused: Not on file    Forced sexual activity: Not on file  Other Topics Concern  .  Not on file  Social History Narrative  . Not on file    Family History  Problem Relation Age of Onset  . Cancer Mother 80       colon  . Cancer Father 34       lung  . CAD Neg Hx     Review of Systems:  As stated in the HPI and otherwise negative.   BP 124/70   Pulse 80   Ht 5\' 9"  (1.753 m)   Wt 234 lb 6.4 oz (106.3 kg)   SpO2 97%   BMI 34.61 kg/m   Physical Examination:  General: Well developed, well nourished, NAD  HEENT: OP clear, mucus membranes moist  SKIN: warm, dry. No rashes. Neuro: No focal deficits  Musculoskeletal: Muscle strength 5/5 all ext  Psychiatric: Mood and affect normal  Neck: No JVD, no carotid bruits, no thyromegaly, no lymphadenopathy.  Lungs:Clear bilaterally, no wheezes, rhonci, crackles Cardiovascular: Regular rate and rhythm. There is a harsh  systolic murmur over the RUSB and a low pitched diastolic rumble over the left sternal border.   Abdomen:Soft. Bowel sounds present. Non-tender.  Extremities: No lower extremity edema. Pulses are 2 + in the bilateral DP/PT.  Echo 12/27/17: Left ventricle: The cavity size was normal. There was moderate   concentric hypertrophy. Systolic function was normal. The   estimated ejection fraction was in the range of 60% to 65%. Wall   motion was normal; there were no regional wall motion   abnormalities. Features are consistent with a pseudonormal left   ventricular filling pattern, with concomitant abnormal relaxation   and increased filling pressure (grade 2 diastolic dysfunction).   Doppler parameters are consistent with high ventricular filling   pressure. - Aortic valve: There was moderate to severe stenosis. There was   moderate regurgitation. Valve area (VTI): 1.03 cm^2. Valve area   (Vmax): 0.82 cm^2. Valve area (Vmean): 0.85 cm^2. - Mitral valve: Severely calcified annulus. Severe diffuse   thickening and calcification of the anterior leaflet and   posterior leaflet. Mobility was restricted. The findings are   consistent with moderate stenosis. Mean gradient (D): 9 mm Hg.   Valve area by continuity equation (using LVOT flow): 1.53 cm^2. - Left atrium: The atrium was moderately dilated. - Right ventricle: The cavity size was mildly to moderately   dilated. Wall thickness was normal. - Tricuspid valve: There was trivial regurgitation. - Pulmonic valve: There was trivial regurgitation.  EKG:  EKG is ordered today. The ekg ordered today demonstrates NSR, rate 80 bpm.   Recent Labs: 04/26/2017: ALT 15; BUN 15; Creatinine, Ser 0.89; Hemoglobin 14.6; Platelets 243; Potassium 4.7; Sodium 141   Lipid Panel    Component Value Date/Time   CHOL 240 (H) 04/26/2017 1207   TRIG 158 (H) 04/26/2017 1207   HDL 63 04/26/2017 1207   CHOLHDL 3.8 04/26/2017 1207   CHOLHDL 4.2 03/25/2015 0001    VLDL 30 03/25/2015 0001   LDLCALC 145 (H) 04/26/2017 1207     Wt Readings from Last 3 Encounters:  12/29/17 234 lb 6.4 oz (106.3 kg)  06/15/17 237 lb 12.8 oz (107.9 kg)  04/26/17 232 lb 6.4 oz (105.4 kg)     Other studies Reviewed: Additional studies/ records that were reviewed today include: . Review of the above records demonstrates:   Assessment and Plan:   1. Aortic stenosis: Moderately severe AS with moderate AI by echo 12/14/16. He is still asymptomatic. I will repeat an echo in 6 months. I think  he will need surgical referral soon. Will review with our CT surgery team at valve conference.   2. Mitral valve stenosis: Moderate MS by echo November 2019 with severe mitral annular calcification. He continues to do well. Will follow for now.    3. Tobacco abuse: He has stopped smoking   Current medicines are reviewed at length with the patient today.  The patient does not have concerns regarding medicines.  The following changes have been made:  no change  Labs/ tests ordered today include:   Orders Placed This Encounter  Procedures  . EKG 12-Lead  . ECHOCARDIOGRAM COMPLETE    Disposition:   FU with me in 6 months  Signed, Lauree Chandler, MD 12/29/2017 9:24 AM    [facility] [place], [place], [place]  [address] Phone: [phone]; Fax: [phone]

## 2017-12-29 NOTE — Patient Instructions (Signed)
Medication Instructions:  Your physician recommends that you continue on your current medications as directed. Please refer to the Current Medication list given to you today.  If you need a refill on your cardiac medications before your next appointment, please call your pharmacy.   Lab work: none If you have labs (blood work) drawn today and your tests are completely normal, you will receive your results only by: Marland Kitchen MyChart Message (if you have MyChart) OR . A paper copy in the mail If you have any lab test that is abnormal or we need to change your treatment, we will call you to review the results.  Testing/Procedures: Your physician has requested that you have an echocardiogram. Echocardiography is a painless test that uses sound waves to create images of your heart. It provides your doctor with information about the size and shape of your heart and how well your heart's chambers and valves are working. This procedure takes approximately one hour. There are no restrictions for this procedure.  To be done in 6 months.   Follow-Up: At [facility], you and your health needs are our priority.  As part of our continuing mission to provide you with exceptional heart care, we have created designated Provider Care Teams.  These Care Teams include your primary Cardiologist (physician) and Advanced Practice Providers (APPs -  Physician Assistants and Nurse Practitioners) who all work together to provide you with the care you need, when you need it. You will need a follow up appointment in 6-7 months. (after echocardiogram)  Please call our office 2 months in advance to schedule this appointment.  You may see Lauree Chandler, MD or one of the following Advanced Practice Providers on your designated Care Team:   Gage, PA-C Melina Copa, PA-C . Ermalinda Barrios, PA-C  Any Other Special Instructions Will Be Listed Below (If Applicable).

## 2018-02-08 DIAGNOSIS — Z950 Presence of cardiac pacemaker: Secondary | ICD-10-CM

## 2018-02-08 HISTORY — DX: Presence of cardiac pacemaker: Z95.0

## 2018-04-28 ENCOUNTER — Ambulatory Visit (INDEPENDENT_AMBULATORY_CARE_PROVIDER_SITE_OTHER): Payer: Medicare HMO | Admitting: Family Medicine

## 2018-04-28 ENCOUNTER — Encounter: Payer: Self-pay | Admitting: Family Medicine

## 2018-04-28 ENCOUNTER — Other Ambulatory Visit: Payer: Self-pay

## 2018-04-28 VITALS — BP 108/76 | HR 82 | Temp 97.6°F | Ht 69.0 in | Wt 235.2 lb

## 2018-04-28 DIAGNOSIS — I050 Rheumatic mitral stenosis: Secondary | ICD-10-CM | POA: Diagnosis not present

## 2018-04-28 DIAGNOSIS — R972 Elevated prostate specific antigen [PSA]: Secondary | ICD-10-CM | POA: Diagnosis not present

## 2018-04-28 DIAGNOSIS — E782 Mixed hyperlipidemia: Secondary | ICD-10-CM

## 2018-04-28 DIAGNOSIS — I350 Nonrheumatic aortic (valve) stenosis: Secondary | ICD-10-CM

## 2018-04-28 DIAGNOSIS — E669 Obesity, unspecified: Secondary | ICD-10-CM | POA: Diagnosis not present

## 2018-04-28 DIAGNOSIS — Z125 Encounter for screening for malignant neoplasm of prostate: Secondary | ICD-10-CM | POA: Diagnosis not present

## 2018-04-28 NOTE — Progress Notes (Addendum)
[name] is a 68 y.o. male who presents for annual wellness visit and follow-up on chronic medical conditions.  He has no particular concerns or complaints.  He does have underlying left ear and MS and is followed regularly by cardiology.  He is now not taking testosterone and did have elevated PSAs in the past.  This needs to be followed up on.  Immunizations were reviewed.  He is taking a multivitamin otherwise he is on no prescription medications.  He is now semiretired.  His home life is going well.   Immunizations and Health Maintenance Immunization History  Administered Date(s) Administered  . DTaP 10/31/1995  . Influenza Split 10/23/2010, 12/27/2011  . Influenza, High Dose Seasonal PF 11/05/2015, 04/26/2017  . Influenza,inj,Quad PF,6+ Mos 12/17/2013  . PPD Test 11/05/2015  . Pneumococcal Conjugate-13 11/05/2015  . Pneumococcal Polysaccharide-23 10/23/2010  . Tdap 12/27/2011  . Zoster 10/23/2010   Health Maintenance Due  Topic Date Due  . PNA vac Low Risk Adult (2 of 2 - PPSV23) 11/04/2016  . INFLUENZA VACCINE  09/08/2017    Last colonoscopy:06-01-2013 Last PSA:07-07-2016 Dentist: 04-17-18 Ophtho: over one year Exercise: active, walking  Other doctors caring for patient include: Dr. Angelena [name] cardio.   Advanced Directives: No.  Information given. Does Patient Have a Medical Advance Directive?: No Would patient like information on creating a medical advance directive?: No - Patient declined  Depression screen:  See questionnaire below.     Depression screen [facility] 2/9 04/28/2018 04/26/2017 11/05/2015 12/27/2011  Decreased Interest 0 0 0 0  Down, Depressed, Hopeless 0 0 0 0  PHQ - 2 Score 0 0 0 0    Fall Screen: See Questionaire below.   Fall Risk  04/28/2018 04/26/2017 11/05/2015 12/27/2011  Falls in the past year? 0 No No No    ADL screen:  See questionnaire below.  Functional Status Survey: Is the patient deaf or have difficulty hearing?: No Does the patient have  difficulty seeing, even when wearing glasses/contacts?: No Does the patient have difficulty concentrating, remembering, or making decisions?: No Does the patient have difficulty walking or climbing stairs?: No Does the patient have difficulty dressing or bathing?: No Does the patient have difficulty doing errands alone such as visiting a doctor's office or shopping?: No   Review of Systems  Constitutional: -, -unexpected weight change, -anorexia, -fatigue Allergy: -sneezing, -itching, -congestion Dermatology: denies changing moles, rash, lumps ENT: -runny nose, -ear pain, -sore throat,  Cardiology:  -chest pain, -palpitations, -orthopnea, Respiratory: -cough, -shortness of breath, -dyspnea on exertion, -wheezing,  Gastroenterology: -abdominal pain, -nausea, -vomiting, -diarrhea, -constipation, -dysphagia Hematology: -bleeding or bruising problems Musculoskeletal: -arthralgias, -myalgias, -joint swelling, -back pain, - Ophthalmology: -vision changes,  Urology: -dysuria, -difficulty urinating,  -urinary frequency, -urgency, incontinence Neurology: -, -numbness, , -memory loss, -falls, -dizziness    PHYSICAL EXAM:  BP 108/76 (BP Location: Left Arm, Patient Position: Sitting)   Pulse 82   Temp 97.6 F (36.4 C)   Ht 5\' 9"  (1.753 m)   Wt 235 lb 3.2 oz (106.7 kg)   SpO2 95%   BMI 34.73 kg/m   General Appearance: Alert, cooperative, no distress, appears stated age Head: Normocephalic, without obvious abnormality, atraumatic Eyes: PERRL, conjunctiva/corneas clear, EOM's intact, fundi benign Ears: Normal TM's and external ear canals Nose: Nares normal, mucosa normal, no drainage or sinus   tenderness Throat: Lips, mucosa, and tongue normal; teeth and gums normal Neck: Supple, no lymphadenopathy, thyroid:no enlargement/tenderness/nodules; no carotid bruit or JVD Lungs: Clear to auscultation bilaterally  without wheezes, rales or ronchi; respirations unlabored Heart: Regular rate and  rhythm, S1 and S2 normal,  4/6 aortic murmur noted,no  rub or gallop Abdomen: Soft, non-tender, nondistended, normoactive bowel sounds, no masses, no hepatosplenomegaly Extremities: No clubbing, cyanosis or edema Pulses: 2+ and symmetric all extremities Skin: Skin color, texture, turgor normal, no rashes or lesions Lymph nodes: Cervical, supraclavicular, and axillary nodes normal Neurologic: CNII-XII intact, normal strength, sensation and gait; reflexes 2+ and symmetric throughout   Psych: Normal mood, affect, hygiene and grooming  ASSESSMENT/PLAN: Elevated PSA - Plan: PSA, CANCELED: PSA  Obesity (BMI 30-39.9) - Plan: CBC with Differential/Platelet, Comprehensive metabolic panel, Lipid panel  Nonrheumatic aortic valve stenosis - Plan: CBC with Differential/Platelet, Comprehensive metabolic panel, Lipid panel  Mitral valve stenosis, unspecified etiology - Plan: CBC with Differential/Platelet, Comprehensive metabolic panel, Lipid panel  Mixed hyperlipidemia  Encouraged him to get the Shingrix vaccine. Discussed PSA screening (risks/benefits), recommended at least 30 minutes of aerobic activity at least 5 days/week;  healthy diet and alcohol recommendations (less than or equal to 2 drinks/day) reviewed; Immunization recommendations discussed.  Colonoscopy recommendations reviewed.   Medicare Attestation I have personally reviewed: The patient's medical and social history Their use of alcohol, tobacco or illicit drugs Their current medications and supplements The patient's functional ability including ADLs,fall risks, home safety risks, cognitive, and hearing and visual impairment Diet and physical activities Evidence for depression or mood disorders  The patient's weight, height, and BMI have been recorded in the chart.  I have made referrals, counseling, and provided education to the patient based on review of the above and I have provided the patient with a written personalized care  plan for preventive services.     Michael Alexanders, MD   04/28/2018

## 2018-04-28 NOTE — Patient Instructions (Signed)
20 minutes of something daily or 150 minutes a week of something physical In terms of calories cut back on white food.  Bread, rice, pasta, potatoes, sugar

## 2018-04-29 LAB — CBC WITH DIFFERENTIAL/PLATELET
BASOS: 1 %
Basophils Absolute: 0 10*3/uL (ref 0.0–0.2)
EOS (ABSOLUTE): 0.3 10*3/uL (ref 0.0–0.4)
Eos: 4 %
HEMOGLOBIN: 14.1 g/dL (ref 13.0–17.7)
Hematocrit: 41.2 % (ref 37.5–51.0)
IMMATURE GRANS (ABS): 0 10*3/uL (ref 0.0–0.1)
IMMATURE GRANULOCYTES: 1 %
LYMPHS: 20 %
Lymphocytes Absolute: 1.3 10*3/uL (ref 0.7–3.1)
MCH: 28.4 pg (ref 26.6–33.0)
MCHC: 34.2 g/dL (ref 31.5–35.7)
MCV: 83 fL (ref 79–97)
MONOCYTES: 11 %
Monocytes Absolute: 0.7 10*3/uL (ref 0.1–0.9)
NEUTROS ABS: 4.2 10*3/uL (ref 1.4–7.0)
Neutrophils: 63 %
PLATELETS: 246 10*3/uL (ref 150–450)
RBC: 4.97 x10E6/uL (ref 4.14–5.80)
RDW: 13.1 % (ref 11.6–15.4)
WBC: 6.6 10*3/uL (ref 3.4–10.8)

## 2018-04-29 LAB — COMPREHENSIVE METABOLIC PANEL
A/G RATIO: 1.5 (ref 1.2–2.2)
ALBUMIN: 4.4 g/dL (ref 3.8–4.8)
ALK PHOS: 58 IU/L (ref 39–117)
ALT: 15 IU/L (ref 0–44)
AST: 18 IU/L (ref 0–40)
BUN/Creatinine Ratio: 16 (ref 10–24)
BUN: 15 mg/dL (ref 8–27)
Bilirubin Total: 0.5 mg/dL (ref 0.0–1.2)
CO2: 21 mmol/L (ref 20–29)
CREATININE: 0.96 mg/dL (ref 0.76–1.27)
Calcium: 9.5 mg/dL (ref 8.6–10.2)
Chloride: 102 mmol/L (ref 96–106)
GFR calc Af Amer: 94 mL/min/{1.73_m2} (ref >59)
GFR, EST NON AFRICAN AMERICAN: 81 mL/min/{1.73_m2} (ref >59)
GLUCOSE: 95 mg/dL (ref 65–99)
Globulin, Total: 3 g/dL (ref 1.5–4.5)
Potassium: 4.2 mmol/L (ref 3.5–5.2)
SODIUM: 138 mmol/L (ref 134–144)
TOTAL PROTEIN: 7.4 g/dL (ref 6.0–8.5)

## 2018-04-29 LAB — LIPID PANEL
CHOLESTEROL TOTAL: 237 mg/dL — AB (ref 100–199)
Chol/HDL Ratio: 3.8 ratio (ref 0.0–5.0)
HDL: 63 mg/dL (ref >39)
LDL CALC: 155 mg/dL — AB (ref 0–99)
TRIGLYCERIDES: 96 mg/dL (ref 0–149)
VLDL Cholesterol Cal: 19 mg/dL (ref 5–40)

## 2018-04-29 LAB — PSA: Prostate Specific Ag, Serum: 5.3 ng/mL — ABNORMAL HIGH (ref 0.0–4.0)

## 2018-05-02 ENCOUNTER — Other Ambulatory Visit: Payer: Self-pay

## 2018-05-02 DIAGNOSIS — R972 Elevated prostate specific antigen [PSA]: Secondary | ICD-10-CM

## 2018-05-02 LAB — PSA TOTAL (REFLEX TO FREE): Prostate Specific Ag, Serum: 5.2 ng/mL — ABNORMAL HIGH (ref 0.0–4.0)

## 2018-05-02 LAB — SPECIMEN STATUS REPORT

## 2018-05-02 LAB — FPSA% REFLEX
% FREE PSA: 10 %
PSA, FREE: 0.52 ng/mL

## 2018-06-20 ENCOUNTER — Telehealth: Payer: Self-pay

## 2018-06-20 NOTE — Telephone Encounter (Signed)
Returned pt call and LVM advising that he would need to call [facility] to set up the appointment due to him missing their call. Pt was given their office number which is [phone]. [place]

## 2018-06-23 ENCOUNTER — Telehealth (HOSPITAL_COMMUNITY): Payer: Self-pay | Admitting: Radiology

## 2018-06-23 NOTE — Telephone Encounter (Signed)

## 2018-06-27 ENCOUNTER — Other Ambulatory Visit: Payer: Self-pay

## 2018-06-27 ENCOUNTER — Ambulatory Visit (HOSPITAL_COMMUNITY): Payer: Medicare HMO | Attending: Cardiovascular Disease

## 2018-06-27 DIAGNOSIS — I350 Nonrheumatic aortic (valve) stenosis: Secondary | ICD-10-CM | POA: Insufficient documentation

## 2018-06-27 DIAGNOSIS — I050 Rheumatic mitral stenosis: Secondary | ICD-10-CM | POA: Insufficient documentation

## 2018-06-28 ENCOUNTER — Telehealth: Payer: Self-pay | Admitting: *Deleted

## 2018-06-28 NOTE — Telephone Encounter (Signed)
-----   Message from Burnell Blanks, MD sent at 06/28/2018 11:03 AM EDT ----- Aortic stenosis and mitral stenosis slightly worse. I am seeing him 08/10/18 by a virtual visit. This should be ok. I will likely set up a cath after that visit. Can we let him know the echo results? If he is feeling poorly, will move up his appt time. Thanks, chris

## 2018-06-28 NOTE — Telephone Encounter (Signed)
I spoke with pt and reviewed echo results with him. Pt reports he is feeling fine   Does get slightly winded at times. I asked him to let us know if shortness of breath worsens or if he has chest pain, dizziness or other changes prior to visit on July 2,2020.  Pt aware visit on July 2 is virtual visit and we would contact him closer to appointment time with instructions.

## 2018-07-07 ENCOUNTER — Ambulatory Visit: Payer: Medicare HMO | Admitting: Cardiovascular Disease

## 2018-07-14 DIAGNOSIS — N500 Atrophy of testis: Secondary | ICD-10-CM | POA: Diagnosis not present

## 2018-07-14 DIAGNOSIS — R972 Elevated prostate specific antigen [PSA]: Secondary | ICD-10-CM | POA: Diagnosis not present

## 2018-07-14 LAB — PSA: PSA: 5.64

## 2018-07-27 ENCOUNTER — Encounter: Payer: Self-pay | Admitting: Family Medicine

## 2018-08-01 ENCOUNTER — Telehealth: Payer: Self-pay | Admitting: Cardiovascular Disease

## 2018-08-01 NOTE — Telephone Encounter (Signed)
Pt is calling to inform Michael Din RN that he would like to keep his upcoming appt with Dr Angelena Form on 7/2 at 4 pm virtually, for he would like for his wife to be present in that visit. Pt states that when Michael Din RN calls to arrange and set up his visit and go over which platform Dr Angelena Form uses, he would like for her to call his cell phone number, not the house phone, and call after 2:30 pm, due to work schedules.  Informed the pt that I will route this communication to [employer] for her review and follow-up, upon return to the office.  Pt verbalized understanding and agrees with this plan.

## 2018-08-01 NOTE — Telephone Encounter (Signed)
New Message            Patient is calling to verify/change his appointment. Pls advise. [phone]

## 2018-08-04 DIAGNOSIS — N500 Atrophy of testis: Secondary | ICD-10-CM | POA: Diagnosis not present

## 2018-08-07 NOTE — Telephone Encounter (Signed)
I spoke with pt and went over video visit instructions with him He would like [place] to call his wife's phone--[phone] Will send consent through my chart

## 2018-08-10 ENCOUNTER — Telehealth (INDEPENDENT_AMBULATORY_CARE_PROVIDER_SITE_OTHER): Payer: Medicare HMO | Admitting: Cardiovascular Disease

## 2018-08-10 ENCOUNTER — Other Ambulatory Visit: Payer: Self-pay

## 2018-08-10 ENCOUNTER — Encounter: Payer: Self-pay | Admitting: Cardiovascular Disease

## 2018-08-10 VITALS — BP 120/70 | HR 88 | Ht 69.0 in | Wt 227.0 lb

## 2018-08-10 DIAGNOSIS — R972 Elevated prostate specific antigen [PSA]: Secondary | ICD-10-CM | POA: Diagnosis not present

## 2018-08-10 DIAGNOSIS — I350 Nonrheumatic aortic (valve) stenosis: Secondary | ICD-10-CM

## 2018-08-10 DIAGNOSIS — I050 Rheumatic mitral stenosis: Secondary | ICD-10-CM | POA: Diagnosis not present

## 2018-08-10 NOTE — Progress Notes (Signed)
Virtual Visit via Video Note   This visit type was conducted due to national recommendations for restrictions regarding the COVID-19 Pandemic (e.g. social distancing) in an effort to limit this patient's exposure and mitigate transmission in our community.  Due to his co-morbid illnesses, this patient is at least at moderate risk for complications without adequate follow up.  This format is felt to be most appropriate for this patient at this time.  All issues noted in this document were discussed and addressed.  A limited physical exam was performed with this format.  Please refer to the patient's chart for his consent to telehealth for [facility].   Date:  08/10/2018   [name], DOB 1950-02-20, MRN [id number]  Patient Location: Home Provider Location: Office  PCP:  Denita Lung, MD  Cardiologist:  Lauree Chandler, MD  Electrophysiologist:  None   Evaluation Performed:  Follow-Up Visit  Chief Complaint:  Follow up- Aortic valve disease/Mitral valve disease   History of Present Illness:    [name] is a 68 y.o. male with history of aortic stenosis, mitral stenosis, tobacco abuse, hyperlipidemia and esophageal stricture who is being seen today by virtual e-visit due to the Covid19 pandemic. He is known to have moderate aortic stenosis, moderate aortic regurgitation and moderate mitral regurgitation.  Most recent echo May 2020 with with normal LV size and function, LVEF=65%. Moderate LVH. Severe aortic stenosis (mean gradient 45 mmHg, peak gradient 70 mmHg), moderate AI, severe MS (mean gradient 13 mmHg).  The patient denies palpitations, dizziness, near syncope or syncope. No lower extremity edema. He is having exertional dyspnea. Occasional chest pressure.   The patient does not have symptoms concerning for COVID-19 infection (fever, chills, cough, or new shortness of breath).    Past Medical History:  Diagnosis Date  . BCE (basal cell epithelioma), arm     RIGHT SHOULDER  . Dyslipidemia   . Esophageal stricture   . Hyperlipidemia   . Inguinal hernia    bilateral  . Obesity   . Smoker    Past Surgical History:  Procedure Laterality Date  . COLONOSCOPY  03/2008,2004   Dr. Amedeo Plenty  . HERNIA REPAIR  12/11/10   BIH  . TEE WITHOUT CARDIOVERSION N/A 12/12/2015   Procedure: TRANSESOPHAGEAL ECHOCARDIOGRAM (TEE);  Surgeon: Sanda Klein, MD;  Location[facility] ENDOSCOPY;  Service: Cardiovascular;  Laterality: N/A;  . THYMECTOMY  1976     Current Meds  Medication Sig  . aspirin 81 MG tablet Take 81 mg by mouth once. Pt takes every other day     Allergies:   Patient has no known allergies.   Social History   Tobacco Use  . Smoking status: Current Every Day Smoker    Packs/day: 0.25    Years: 35.00    Pack years: 8.75    Types: Cigarettes  . Smokeless tobacco: Never Used  Substance Use Topics  . Alcohol use: Yes    Alcohol/week: 1.0 standard drinks    Types: 1 Cans of beer per week  . Drug use: No     Family Hx: The patient's family history includes Cancer (age of onset: 39) in his father; Cancer (age of onset: 29) in his mother. There is no history of CAD.  ROS:   Please see the history of present illness.    All other systems reviewed and are negative.   Prior CV studies:   The following studies were reviewed today:  Echo 06/27/18:  1. The left ventricle has hyperdynamic systolic function, with an ejection fraction of >65%. The cavity size was normal. There is moderately increased left ventricular wall thickness. Left ventricular diastolic Doppler parameters are indeterminate.  2. The right ventricle has normal systolic function. The cavity was normal.  3. Left atrial size was severely dilated.  4. The mitral valve is abnormal. There is severe mitral annular calcification present. Severe mitral valve stenosis.  5. The tricuspid valve is grossly normal.  6. There is mild dilatation of the ascending aorta measuring 38 mm.  7.  Vigorous LV systolic function; moderate LVH; mildly dilated ascending aorta; heavily calcified aortic valve with severe AS (mean gradient 45 mmHg) and at least moderate AI (difficult to quantitate due to contamination from mitral inflow); severe MAC  with severe MS (mean gradient 13 mmHg; MVA by pressure halftime 1.1 cm2); severe LAE.  FINDINGS  Left Ventricle: The left ventricle has hyperdynamic systolic function, with an ejection fraction of >65%. The cavity size was normal. There is moderately increased left ventricular wall thickness. Left ventricular diastolic Doppler parameters are  indeterminate.  Right Ventricle: The right ventricle has normal systolic function. The cavity was normal. There is mildly increased right ventricular wall thickness.  Left Atrium: Left atrial size was severely dilated.  Right Atrium: Right atrial size was normal in size. Right atrial pressure is estimated at 3 mmHg.  Interatrial Septum: No atrial level shunt detected by color flow Doppler.  Pericardium: There is no evidence of pericardial effusion.  Mitral Valve: The mitral valve is abnormal. There is severe mitral annular calcification present. Mitral valve regurgitation is trivial by color flow Doppler. Severe mitral valve stenosis.  Tricuspid Valve: The tricuspid valve is grossly normal. Tricuspid valve regurgitation is mild by color flow Doppler.  Aortic Valve: The aortic valve has an indeterminate number of cusps Severe calcifcation of the aortic valve. Aortic valve regurgitation is moderate by color flow Doppler. There is Severe stenosis of the aortic valve  Pulmonic Valve: The pulmonic valve was grossly normal. Pulmonic valve regurgitation is trivial by color flow Doppler.  Aorta: There is mild dilatation of the ascending aorta measuring 38 mm.  Venous: The inferior vena cava is normal in size with greater than 50% respiratory variability.    +--------------+--------++ LEFT  VENTRICLE         +----------------+---------++ +--------------+--------++ Diastology                PLAX 2D                +----------------+---------++ +--------------+--------++ LV e' lateral:  5.98 cm/s LVIDd:        4.50 cm  +----------------+---------++ +--------------+--------++ LV E/e' lateral:35.8      LVIDs:        2.17 cm  +----------------+---------++ +--------------+--------++ LV e' medial:   6.74 cm/s LV PW:        1.12 cm  +----------------+---------++ +--------------+--------++ LV E/e' medial: 31.8      LV IVS:       0.80 cm  +----------------+---------++ +--------------+--------++ LVOT diam:    2.20 cm  +--------------+--------++ LV SV:        77 ml    +--------------+--------++ LV SV Index:  33.02    +--------------+--------++ LVOT Area:    3.80 cm +--------------+--------++                        +--------------+--------++  +---------------+----------++ RIGHT VENTRICLE           +---------------+----------++  RV S prime:    14.00 cm/s +---------------+----------++ TAPSE (M-mode):2.1 cm     +---------------+----------++  +---------------+--------++-----------++ LEFT ATRIUM            Index       +---------------+--------++-----------++ LA diam:       5.20 cm 2.35 cm/m  +---------------+--------++-----------++ LA Vol (A2C):  115.0 ml51.96 ml/m +---------------+--------++-----------++ LA Vol (A4C):  116.0 ml52.41 ml/m +---------------+--------++-----------++ LA Biplane Vol:118.0 ml53.31 ml/m +---------------+--------++-----------++ +------------+---------++-----------++ RIGHT ATRIUM         Index       +------------+---------++-----------++ RA Area:    20.30 cm            +------------+---------++-----------++ RA Volume:  55.70 ml 25.17 ml/m +------------+---------++-----------++  +------------------+------------++ AORTIC  VALVE                   +------------------+------------++ AV Area (Vmax):   2.25 cm     +------------------+------------++ AV Area (Vmean):  2.15 cm     +------------------+------------++ AV Area (VTI):    2.68 cm     +------------------+------------++ AV Vmax:          419.75 cm/s  +------------------+------------++ AV Vmean:         293.750 cm/s +------------------+------------++ AV VTI:           0.970 m      +------------------+------------++ AV Peak Grad:     70.5 mmHg    +------------------+------------++ AV Mean Grad:     45.3 mmHg    +------------------+------------++ LVOT Vmax:        248.00 cm/s  +------------------+------------++ LVOT Vmean:       166.000 cm/s +------------------+------------++ LVOT VTI:         0.683 m      +------------------+------------++ LVOT/AV VTI ratio:0.70         +------------------+------------++ AR PHT:           229 msec     +------------------+------------++   +-------------+-------++ AORTA                +-------------+-------++ Ao Root diam:3.40 cm +-------------+-------++ Ao Asc diam: 3.80 cm +-------------+-------++  +--------------+----------++   +---------------+-----------++ MITRAL VALVE               TRICUSPID VALVE            +--------------+----------++   +---------------+-----------++ MV Area (PHT):1.1 cm      TR Peak grad:  31.4 mmHg   +--------------+----------++   +---------------+-----------++ MV Peak grad: 27.9 mmHg    TR Vmax:       343.00 cm/s +--------------+----------++   +---------------+-----------++ MV Mean grad: 13.0 mmHg  +--------------+----------++   +--------------+-------+ MV Vmax:      2.64 m/s     SHUNTS                +--------------+----------++   +--------------+-------+ MV Vmean:     166.0 cm/s   Systemic VTI: 0.68 m  +--------------+----------++   +--------------+-------+ MV  VTI:       0.77 m       Systemic Diam:2.20 cm +--------------+----------++   +--------------+-------+ MV PHT:       202 msec   +--------------+----------++ MV Decel Time:398 msec   +--------------+----------++ +---------------+-----------++ MR Peak grad:  143.0 mmHg  +---------------+-----------++ MR Mean grad:  87.0 mmHg   +---------------+-----------++ MR Vmax:       598.00 cm/s +---------------+-----------++ MR Vmean:      434.0 cm/s  +---------------+-----------++ MR PISA:       1.01 cm    +---------------+-----------++  MR PISA Radius:0.40 cm     +---------------+-----------++ +--------------+-----------++ MV E velocity:214.00 cm/s +--------------+-----------++ MV A velocity:235.00 cm/s +--------------+-----------++ MV E/A ratio: 0.91        +--------------+-----------++   Labs/Other Tests and Data Reviewed:    EKG:  No ECG reviewed.  Recent Labs: 04/28/2018: ALT 15; BUN 15; Creatinine, Ser 0.96; Hemoglobin 14.1; Platelets 246; Potassium 4.2; Sodium 138   Recent Lipid Panel Lab Results  Component Value Date/Time   CHOL 237 (H) 04/28/2018 10:20 AM   TRIG 96 04/28/2018 10:20 AM   HDL 63 04/28/2018 10:20 AM   CHOLHDL 3.8 04/28/2018 10:20 AM   CHOLHDL 4.2 03/25/2015 12:01 AM   LDLCALC 155 (H) 04/28/2018 10:20 AM    Wt Readings from Last 3 Encounters:  08/10/18 227 lb (103 kg)  04/28/18 235 lb 3.2 oz (106.7 kg)  12/29/17 234 lb 6.4 oz (106.3 kg)     Objective:    Vital Signs:  BP 120/70   Pulse 88   Ht _0  (1.753 m)   Wt 227 lb (103 kg)   BMI 33.52 kg/m    VITAL SIGNS:  reviewed GEN:  no acute distress  ASSESSMENT & PLAN:    1. Severe Aortic stenosis: His aortic stenosis is now severe. Will arrange right and left heart cath at [facility] and plan surgical evaluation following his cath. Cath will be on Moderately severe AS with moderate AI by echo 12/14/16. He is still asymptomatic. I will repeat an echo in 6  months. I think he will need surgical referral soon. Will review with our CT surgery team at valve conference.   2. Mitral valve stenosis: severe MS by echo May 2020. Cath as above then surgical referral    3. Tobacco abuse: He has stopped smoking   COVID-19 Education: The signs and symptoms of COVID-19 were discussed with the patient and how to seek care for testing (follow up with PCP or arrange E-visit).  The importance of social distancing was discussed today.  Time:   Today, I have spent 25 minutes with the patient with telehealth technology discussing the above problems.     Medication Adjustments/Labs and Tests Ordered: Current medicines are reviewed at length with the patient today.  Concerns regarding medicines are outlined above.   Tests Ordered: No orders of the defined types were placed in this encounter.   Medication Changes: No orders of the defined types were placed in this encounter.   Disposition:  Follow up in 3 month(s)  Signed, Lauree Chandler, MD  08/10/2018 4:46 PM    [place]

## 2018-08-10 NOTE — Patient Instructions (Addendum)
Medication Instructions:  Your provider recommends that you continue on your current medications as directed. Please refer to the Current Medication list given to you today.    Labwork: You will have labs drawn prior to your heart cath.  Testing/Procedures: Your physician has requested that you have a cardiac catheterization. Cardiac catheterization is used to diagnose and/or treat various heart conditions. Doctors may recommend this procedure for a number of different reasons. The most common reason is to evaluate chest pain. Chest pain can be a symptom of coronary artery disease (CAD), and cardiac catheterization can show whether plaque is narrowing or blocking your heart's arteries. This procedure is also used to evaluate the valves, as well as measure the blood flow and oxygen levels in different parts of your heart. For further information please visit [email or web address]. Please follow instruction sheet, as given.   CATHETERIZATION INSTRUCTIONS:  You are scheduled for a Cardiac Catheterization on Friday, September 15, 2018 with Dr. Angelena Form.  1. Please arrive at the [facility] (Main Entrance A) at [facility] [address] [place], [place] [address] at 7:00AM. This is 2 hours prior to your procedure. Please wear a mask upon arrival to the hospital. If you do not have one, one will be given to you. There is a "no visitors" policy. You will need to be dropped off at the hospital for your procedure. Afterward, Dr. Angelena Form will call your designated party for an update and discharge information.  Special note: Every effort is made to have your procedure done on time. Please understand that emergencies sometimes delay scheduled procedures.  2. Diet: Do not eat solid foods after midnight.  The patient may have clear liquids until 5am upon the day of the procedure.  3. Labs: You will need blood drawn prior to your procedure. You will also need a COVID screen 3 days prior to your  procedure. This will be done at the [facility] Thru testing site ([place] [facility]).   4. Medication instructions in preparation for your procedure:  1) MAKE SURE TO TAKE YOUR ASPIRIN the morning of your procedure.  5. Plan for one night stay--bring personal belongings. 6. Bring a current list of your medications and current insurance cards. 7. You MUST have a responsible person to drive you home. 8. Someone MUST be with you the first 24 hours after you arrive home or your discharge will be delayed. 9. Please wear clothes that are easy to get on and off and wear slip-on shoes.  Thank you for allowing [place] to care for you!   -- [place] [facility]

## 2018-08-17 DIAGNOSIS — R011 Cardiac murmur, unspecified: Secondary | ICD-10-CM | POA: Diagnosis not present

## 2018-08-17 DIAGNOSIS — C61 Malignant neoplasm of prostate: Secondary | ICD-10-CM | POA: Diagnosis not present

## 2018-08-18 ENCOUNTER — Encounter: Payer: Self-pay | Admitting: *Deleted

## 2018-08-22 ENCOUNTER — Encounter: Payer: Self-pay | Admitting: *Deleted

## 2018-08-22 ENCOUNTER — Telehealth: Payer: Self-pay | Admitting: *Deleted

## 2018-08-22 DIAGNOSIS — I050 Rheumatic mitral stenosis: Secondary | ICD-10-CM

## 2018-08-22 DIAGNOSIS — I350 Nonrheumatic aortic (valve) stenosis: Secondary | ICD-10-CM

## 2018-08-22 NOTE — Addendum Note (Signed)
Addended by: Thompson Grayer on: 08/22/2018 09:55 AM   Modules accepted: Orders

## 2018-08-22 NOTE — Telephone Encounter (Signed)
I spoke with pt and scheduled lab work for 11:00 in our office on August 4th and covid testing at [place] at 11:30 on August 4th. Pt aware lab work is prior to covid testing and that he will need to quarantine with no visitors after covid testing until time of procedure.  He is having trouble getting in my chart so will mail instructions to him at listed address.

## 2018-08-28 DIAGNOSIS — H52223 Regular astigmatism, bilateral: Secondary | ICD-10-CM | POA: Diagnosis not present

## 2018-08-28 DIAGNOSIS — H5213 Myopia, bilateral: Secondary | ICD-10-CM | POA: Diagnosis not present

## 2018-08-28 DIAGNOSIS — H524 Presbyopia: Secondary | ICD-10-CM | POA: Diagnosis not present

## 2018-09-05 ENCOUNTER — Ambulatory Visit: Payer: Medicare HMO | Admitting: Radiation Oncology

## 2018-09-12 ENCOUNTER — Other Ambulatory Visit (HOSPITAL_COMMUNITY)
Admission: RE | Admit: 2018-09-12 | Discharge: 2018-09-12 | Disposition: A | Discharge status: Home / Self Care | Payer: Medicare HMO | Source: Ambulatory Visit | Attending: Cardiovascular Disease | Admitting: Cardiovascular Disease

## 2018-09-12 ENCOUNTER — Other Ambulatory Visit: Payer: Self-pay

## 2018-09-12 ENCOUNTER — Other Ambulatory Visit: Payer: Medicare HMO

## 2018-09-12 DIAGNOSIS — Z01812 Encounter for preprocedural laboratory examination: Secondary | ICD-10-CM | POA: Diagnosis not present

## 2018-09-12 DIAGNOSIS — I350 Nonrheumatic aortic (valve) stenosis: Secondary | ICD-10-CM

## 2018-09-12 DIAGNOSIS — Z20828 Contact with and (suspected) exposure to other viral communicable diseases: Secondary | ICD-10-CM | POA: Insufficient documentation

## 2018-09-12 DIAGNOSIS — I050 Rheumatic mitral stenosis: Secondary | ICD-10-CM

## 2018-09-12 LAB — BASIC METABOLIC PANEL
BUN/Creatinine Ratio: 18 (ref 10–24)
BUN: 17 mg/dL (ref 8–27)
CO2: 22 mmol/L (ref 20–29)
Calcium: 9.4 mg/dL (ref 8.6–10.2)
Chloride: 103 mmol/L (ref 96–106)
Creatinine, Ser: 0.93 mg/dL (ref 0.76–1.27)
GFR calc Af Amer: 98 mL/min/{1.73_m2} (ref >59)
GFR calc non Af Amer: 85 mL/min/{1.73_m2} (ref >59)
Glucose: 85 mg/dL (ref 65–99)
Potassium: 4.4 mmol/L (ref 3.5–5.2)
Sodium: 141 mmol/L (ref 134–144)

## 2018-09-12 LAB — CBC
Hematocrit: 41.8 % (ref 37.5–51.0)
Hemoglobin: 13.9 g/dL (ref 13.0–17.7)
MCH: 28.2 pg (ref 26.6–33.0)
MCHC: 33.3 g/dL (ref 31.5–35.7)
MCV: 85 fL (ref 79–97)
Platelets: 209 10*3/uL (ref 150–450)
RBC: 4.93 x10E6/uL (ref 4.14–5.80)
RDW: 14.1 % (ref 11.6–15.4)
WBC: 7.2 10*3/uL (ref 3.4–10.8)

## 2018-09-12 LAB — SARS CORONAVIRUS 2 (TAT 6-24 HRS): SARS Coronavirus 2: NEGATIVE

## 2018-09-14 ENCOUNTER — Telehealth: Payer: Self-pay | Admitting: *Deleted

## 2018-09-14 NOTE — Telephone Encounter (Signed)
Pt contacted pre-catheterization scheduled at [facility] for: Friday September 15, 2018 9 AM Verified arrival time and place: [facility]) at: 7 AM   No solid food after midnight prior to cath, clear liquids until 5 AM day of procedure. Contrast allergy: no  AM meds can be  taken pre-cath with sip of water including: ASA 81 mg   Confirmed patient has responsible person to drive home post procedure and observe 24 hours after arriving home: yes  Due to Covid-19 pandemic, only one support person will be allowed with patient. Must be the same support person for that patient's entire stay, will be screened and required to wear a mask.   Patients are required to wear a mask when they enter the hospital.       COVID-19 Pre-Screening Questions:  . In the past 7 to 10 days have you had a cough,  shortness of breath, headache, congestion, fever (100 or greater) body aches, chills, sore throat, or sudden loss of taste or sense of smell? no . Have you been around anyone with known Covid 19? no . Have you been around anyone who is awaiting Covid 19 test results in the past 7 to 10 days? no . Have you been around anyone who has been exposed to Covid 19, or has mentioned symptoms of Covid 19 within the past 7 to 10 days? no   I reviewed procedure/mask/visitor, Covid-19 screening questions with patient, he verbalized understanding, thanked me for call.

## 2018-09-15 ENCOUNTER — Encounter (HOSPITAL_COMMUNITY)
Admission: RE | Disposition: A | Discharge status: Home / Self Care | Payer: Medicare HMO | Source: Ambulatory Visit | Attending: Cardiovascular Disease

## 2018-09-15 ENCOUNTER — Encounter (HOSPITAL_COMMUNITY): Payer: Self-pay | Admitting: Cardiovascular Disease

## 2018-09-15 ENCOUNTER — Other Ambulatory Visit: Payer: Self-pay

## 2018-09-15 ENCOUNTER — Other Ambulatory Visit: Payer: Self-pay | Admitting: *Deleted

## 2018-09-15 ENCOUNTER — Ambulatory Visit (HOSPITAL_COMMUNITY)
Admission: RE | Admit: 2018-09-15 | Discharge: 2018-09-15 | Disposition: A | Discharge status: Home / Self Care | Payer: Medicare HMO | Source: Ambulatory Visit | Attending: Cardiovascular Disease | Admitting: Cardiovascular Disease

## 2018-09-15 DIAGNOSIS — E785 Hyperlipidemia, unspecified: Secondary | ICD-10-CM | POA: Diagnosis not present

## 2018-09-15 DIAGNOSIS — I2510 Atherosclerotic heart disease of native coronary artery without angina pectoris: Secondary | ICD-10-CM

## 2018-09-15 DIAGNOSIS — I080 Rheumatic disorders of both mitral and aortic valves: Secondary | ICD-10-CM | POA: Insufficient documentation

## 2018-09-15 DIAGNOSIS — I350 Nonrheumatic aortic (valve) stenosis: Secondary | ICD-10-CM | POA: Diagnosis not present

## 2018-09-15 DIAGNOSIS — F17210 Nicotine dependence, cigarettes, uncomplicated: Secondary | ICD-10-CM | POA: Diagnosis not present

## 2018-09-15 DIAGNOSIS — Z7982 Long term (current) use of aspirin: Secondary | ICD-10-CM | POA: Insufficient documentation

## 2018-09-15 DIAGNOSIS — I050 Rheumatic mitral stenosis: Secondary | ICD-10-CM

## 2018-09-15 DIAGNOSIS — Z6833 Body mass index (BMI) 33.0-33.9, adult: Secondary | ICD-10-CM | POA: Diagnosis not present

## 2018-09-15 DIAGNOSIS — E669 Obesity, unspecified: Secondary | ICD-10-CM | POA: Insufficient documentation

## 2018-09-15 DIAGNOSIS — Z85828 Personal history of other malignant neoplasm of skin: Secondary | ICD-10-CM | POA: Insufficient documentation

## 2018-09-15 HISTORY — PX: RIGHT/LEFT HEART CATH AND CORONARY ANGIOGRAPHY: CATH118266

## 2018-09-15 LAB — POCT I-STAT EG7
Acid-base deficit: 3 mmol/L — ABNORMAL HIGH (ref 0.0–2.0)
Bicarbonate: 24.1 mmol/L (ref 20.0–28.0)
Calcium, Ion: 1.24 mmol/L (ref 1.15–1.40)
HCT: 40 % (ref 39.0–52.0)
Hemoglobin: 13.6 g/dL (ref 13.0–17.0)
O2 Saturation: 78 %
Potassium: 3.8 mmol/L (ref 3.5–5.1)
Sodium: 142 mmol/L (ref 135–145)
TCO2: 25 mmol/L (ref 22–32)
pCO2, Ven: 47.8 mmHg (ref 44.0–60.0)
pH, Ven: 7.31 (ref 7.250–7.430)
pO2, Ven: 47 mmHg — ABNORMAL HIGH (ref 32.0–45.0)

## 2018-09-15 LAB — POCT I-STAT 7, (LYTES, BLD GAS, ICA,H+H)
Acid-base deficit: 3 mmol/L — ABNORMAL HIGH (ref 0.0–2.0)
Bicarbonate: 22.9 mmol/L (ref 20.0–28.0)
Calcium, Ion: 1.19 mmol/L (ref 1.15–1.40)
HCT: 38 % — ABNORMAL LOW (ref 39.0–52.0)
Hemoglobin: 12.9 g/dL — ABNORMAL LOW (ref 13.0–17.0)
O2 Saturation: 99 %
Potassium: 3.7 mmol/L (ref 3.5–5.1)
Sodium: 142 mmol/L (ref 135–145)
TCO2: 24 mmol/L (ref 22–32)
pCO2 arterial: 41.6 mmHg (ref 32.0–48.0)
pH, Arterial: 7.348 — ABNORMAL LOW (ref 7.350–7.450)
pO2, Arterial: 127 mmHg — ABNORMAL HIGH (ref 83.0–108.0)

## 2018-09-15 SURGERY — RIGHT/LEFT HEART CATH AND CORONARY ANGIOGRAPHY
Anesthesia: LOCAL

## 2018-09-15 MED ORDER — SODIUM CHLORIDE 0.9 % IV SOLN: 250.0000 mL | INTRAVENOUS | Status: DC | PRN

## 2018-09-15 MED ORDER — SODIUM CHLORIDE 0.9% FLUSH: 3.0000 mL | Freq: Two times a day (BID) | INTRAVENOUS | Status: DC

## 2018-09-15 MED ORDER — ONDANSETRON HCL 4 MG/2ML IJ SOLN: 4.0000 mg | Freq: Four times a day (QID) | INTRAMUSCULAR | Status: DC | PRN

## 2018-09-15 MED ORDER — LIDOCAINE HCL (PF) 1 % IJ SOLN
INTRAMUSCULAR | Status: DC | PRN
  Administered 2018-09-15 (×2): 2 mL

## 2018-09-15 MED ORDER — HEPARIN SODIUM (PORCINE) 1000 UNIT/ML IJ SOLN
INTRAMUSCULAR | Status: AC
  Filled 2018-09-15: qty 1

## 2018-09-15 MED ORDER — SODIUM CHLORIDE 0.9 % IV SOLN: INTRAVENOUS | Status: AC

## 2018-09-15 MED ORDER — HEPARIN SODIUM (PORCINE) 1000 UNIT/ML IJ SOLN
INTRAMUSCULAR | Status: DC | PRN
  Administered 2018-09-15: 5000 [IU] via INTRAVENOUS

## 2018-09-15 MED ORDER — SODIUM CHLORIDE 0.9 % WEIGHT BASED INFUSION
3.0000 mL/kg/h | INTRAVENOUS | Status: AC
  Administered 2018-09-15: 3 mL/kg/h via INTRAVENOUS

## 2018-09-15 MED ORDER — LIDOCAINE HCL (PF) 1 % IJ SOLN
INTRAMUSCULAR | Status: AC
  Filled 2018-09-15: qty 30

## 2018-09-15 MED ORDER — ASPIRIN 81 MG PO CHEW: 81.0000 mg | CHEWABLE_TABLET | ORAL | Status: DC

## 2018-09-15 MED ORDER — VERAPAMIL HCL 2.5 MG/ML IV SOLN
INTRAVENOUS | Status: AC
  Filled 2018-09-15: qty 2

## 2018-09-15 MED ORDER — SODIUM CHLORIDE 0.9 % WEIGHT BASED INFUSION: 1.0000 mL/kg/h | INTRAVENOUS | Status: DC

## 2018-09-15 MED ORDER — HEPARIN (PORCINE) IN NACL 1000-0.9 UT/500ML-% IV SOLN
INTRAVENOUS | Status: AC
  Filled 2018-09-15: qty 1000

## 2018-09-15 MED ORDER — IOHEXOL 350 MG/ML SOLN
INTRAVENOUS | Status: DC | PRN
  Administered 2018-09-15: 10:00:00 40 mL via INTRAVENOUS

## 2018-09-15 MED ORDER — LABETALOL HCL 5 MG/ML IV SOLN: 10.0000 mg | INTRAVENOUS | Status: DC | PRN

## 2018-09-15 MED ORDER — HYDRALAZINE HCL 20 MG/ML IJ SOLN: 10.0000 mg | INTRAMUSCULAR | Status: DC | PRN

## 2018-09-15 MED ORDER — ACETAMINOPHEN 325 MG PO TABS: 650.0000 mg | ORAL_TABLET | ORAL | Status: DC | PRN

## 2018-09-15 MED ORDER — MIDAZOLAM HCL 2 MG/2ML IJ SOLN
INTRAMUSCULAR | Status: AC
  Filled 2018-09-15: qty 2

## 2018-09-15 MED ORDER — VERAPAMIL HCL 2.5 MG/ML IV SOLN
INTRAVENOUS | Status: DC | PRN
  Administered 2018-09-15: 10 mL via INTRA_ARTERIAL

## 2018-09-15 MED ORDER — SODIUM CHLORIDE 0.9% FLUSH: 3.0000 mL | INTRAVENOUS | Status: DC | PRN

## 2018-09-15 MED ORDER — MIDAZOLAM HCL 2 MG/2ML IJ SOLN
INTRAMUSCULAR | Status: DC | PRN
  Administered 2018-09-15: 2 mg via INTRAVENOUS

## 2018-09-15 MED ORDER — FENTANYL CITRATE (PF) 100 MCG/2ML IJ SOLN
INTRAMUSCULAR | Status: DC | PRN
  Administered 2018-09-15: 50 ug via INTRAVENOUS

## 2018-09-15 MED ORDER — HEPARIN (PORCINE) IN NACL 1000-0.9 UT/500ML-% IV SOLN
INTRAVENOUS | Status: DC | PRN
  Administered 2018-09-15 (×2): 500 mL

## 2018-09-15 MED ORDER — FENTANYL CITRATE (PF) 100 MCG/2ML IJ SOLN
INTRAMUSCULAR | Status: AC
  Filled 2018-09-15: qty 2

## 2018-09-15 SURGICAL SUPPLY — 12 items
CATH 5FR JL3.5 JR4 ANG PIG MP (CATHETERS) ×1 IMPLANT
CATH BALLN WEDGE 5F 110CM (CATHETERS) ×1 IMPLANT
CATH INFINITI 5FR AL1 (CATHETERS) ×1 IMPLANT
GLIDESHEATH SLEND SS 6F .021 (SHEATH) ×1 IMPLANT
GUIDEWIRE INQWIRE 1.5J.035X260 (WIRE) IMPLANT
INQWIRE 1.5J .035X260CM (WIRE) ×2
KIT HEART LEFT (KITS) ×2 IMPLANT
PACK CARDIAC CATHETERIZATION (CUSTOM PROCEDURE TRAY) ×2 IMPLANT
SHEATH GLIDE SLENDER 4/5FR (SHEATH) ×1 IMPLANT
TRANSDUCER W/STOPCOCK (MISCELLANEOUS) ×2 IMPLANT
TUBING CIL FLEX 10 FLL-RA (TUBING) ×2 IMPLANT
WIRE EMERALD ST .035X150CM (WIRE) ×1 IMPLANT

## 2018-09-15 NOTE — Research (Signed)
[place] Informed Consent   Subject Name: [name]  Subject met inclusion and exclusion criteria.  The informed consent form, study requirements and expectations were reviewed with the subject and questions and concerns were addressed prior to the signing of the consent form.  The subject verbalized understanding of the trial requirements.  The subject agreed to participate in the [facility] trial and signed the informed consent at [place] on 09/15/2018.  The informed consent was obtained prior to performance of any protocol-specific procedures for the subject.  A copy of the signed informed consent was given to the subject and a copy was placed in the subject's medical record.   Rosa Gambale

## 2018-09-15 NOTE — H&P (Signed)
ID:  [name], DOB 10-31-1950, MRN [id number]  History of Present Illness:    [name] is a 67 y.o. male with history of aortic stenosis, mitral stenosis, tobacco abuse, hyperlipidemia andesophageal stricture who is here today for cardiac cath. He has severe AS and severe MS. Echo May 2020 with with normal LV size and function, LVEF=65%. Moderate LVH. Severe aortic stenosis (mean gradient 45 mmHg, peak gradient 70 mmHg), moderate AI, severe MS (mean gradient 13 mmHg).  He tells me today that he continues to have dyspnea with exertion.   PCP:  Denita Lung, MD    Cardiologist:  Lauree Chandler, MD        Past Medical History:  Diagnosis Date  . BCE (basal cell epithelioma), arm    RIGHT SHOULDER  . Dyslipidemia   . Esophageal stricture   . Hyperlipidemia   . Inguinal hernia    bilateral  . Obesity   . Smoker         Past Surgical History:  Procedure Laterality Date  . COLONOSCOPY  03/2008,2004   Dr. Amedeo Plenty  . HERNIA REPAIR  12/11/10   BIH  . TEE WITHOUT CARDIOVERSION N/A 12/12/2015   Procedure: TRANSESOPHAGEAL ECHOCARDIOGRAM (TEE);  Surgeon: Sanda Klein, MD;  Location: [facility] ENDOSCOPY;  Service: Cardiovascular;  Laterality: N/A;  . THYMECTOMY  1976     Active Medications      Current Meds  Medication Sig  . aspirin 81 MG tablet Take 81 mg by mouth once. Pt takes every other day       Allergies:   Patient has no known allergies.   Social History        Tobacco Use  . Smoking status: Current Every Day Smoker    Packs/day: 0.25    Years: 35.00    Pack years: 8.75    Types: Cigarettes  . Smokeless tobacco: Never Used  Substance Use Topics  . Alcohol use: Yes    Alcohol/week: 1.0 standard drinks    Types: 1 Cans of beer per week  . Drug use: No     Family Hx: The patient's family history includes Cancer (age of onset: 40) in his father; Cancer (age of onset: 34) in his mother. There is no  history of CAD.  ROS:   Please see the history of present illness.    All other systems reviewed and are negative.  Physical Examination:    Prior CV studies:   The following studies were reviewed today:  Echo 06/27/18: 1. The left ventricle has hyperdynamic systolic function, with an ejection fraction of >65%. The cavity size was normal. There is moderately increased left ventricular wall thickness. Left ventricular diastolic Doppler parameters are indeterminate. 2. The right ventricle has normal systolic function. The cavity was normal. 3. Left atrial size was severely dilated. 4. The mitral valve is abnormal. There is severe mitral annular calcification present. Severe mitral valve stenosis. 5. The tricuspid valve is grossly normal. 6. There is mild dilatation of the ascending aorta measuring 38 mm. 7. Vigorous LV systolic function; moderate LVH; mildly dilated ascending aorta; heavily calcified aortic valve with severe AS (mean gradient 45 mmHg) and at least moderate AI (difficult to quantitate due to contamination from mitral inflow); severe MAC  with severe MS (mean gradient 13 mmHg; MVA by pressure halftime 1.1 cm2); severe LAE.  FINDINGS Left Ventricle: The left ventricle has hyperdynamic systolic function, with an ejection fraction of >65%. The cavity size was  normal. There is moderately increased left ventricular wall thickness. Left ventricular diastolic Doppler parameters are  indeterminate.  Right Ventricle: The right ventricle has normal systolic function. The cavity was normal. There is mildly increased right ventricular wall thickness.  Left Atrium: Left atrial size was severely dilated.  Right Atrium: Right atrial size was normal in size. Right atrial pressure is estimated at 3 mmHg.  Interatrial Septum: No atrial level shunt detected by color flow Doppler.  Pericardium: There is no evidence of pericardial effusion.  Mitral Valve: The mitral valve  is abnormal. There is severe mitral annular calcification present. Mitral valve regurgitation is trivial by color flow Doppler. Severe mitral valve stenosis.  Tricuspid Valve: The tricuspid valve is grossly normal. Tricuspid valve regurgitation is mild by color flow Doppler.  Aortic Valve: The aortic valve has an indeterminate number of cusps Severe calcifcation of the aortic valve. Aortic valve regurgitation is moderate by color flow Doppler. There is Severe stenosis of the aortic valve  Pulmonic Valve: The pulmonic valve was grossly normal. Pulmonic valve regurgitation is trivial by color flow Doppler.  Aorta: There is mild dilatation of the ascending aorta measuring 38 mm.  Venous: The inferior vena cava is normal in size with greater than 50% respiratory variability.   +--------------+--------++ LEFT VENTRICLE  +----------------+---------++ +--------------+--------++ Diastology   PLAX 2D   +----------------+---------++ +--------------+--------++ LV e' lateral: 5.98 cm/s LVIDd: 4.50 cm  +----------------+---------++ +--------------+--------++ LV E/e' lateral:35.8  LVIDs: 2.17 cm  +----------------+---------++ +--------------+--------++ LV e' medial: 6.74 cm/s LV PW: 1.12 cm  +----------------+---------++ +--------------+--------++ LV E/e' medial: 31.8  LV IVS: 0.80 cm  +----------------+---------++ +--------------+--------++ LVOT diam: 2.20 cm  +--------------+--------++ LV SV: 77 ml  +--------------+--------++ LV SV Index: 33.02  +--------------+--------++ LVOT Area: 3.80 cm +--------------+--------++    +--------------+--------++  +---------------+----------++ RIGHT VENTRICLE  +---------------+----------++ RV S prime: 14.00 cm/s +---------------+----------++ TAPSE  (M-mode):2.1 cm  +---------------+----------++  +---------------+--------++-----------++ LEFT ATRIUM  Index  +---------------+--------++-----------++ LA diam: 5.20 cm 2.35 cm/m  +---------------+--------++-----------++ LA Vol (A2C): 115.0 ml51.96 ml/m +---------------+--------++-----------++ LA Vol (A4C): 116.0 ml52.41 ml/m +---------------+--------++-----------++ LA Biplane Vol:118.0 ml53.31 ml/m +---------------+--------++-----------++ +------------+---------++-----------++ RIGHT ATRIUM Index  +------------+---------++-----------++ RA Area: 20.30 cm  +------------+---------++-----------++ RA Volume: 55.70 ml 25.17 ml/m +------------+---------++-----------++ +------------------+------------++ AORTIC VALVE   +------------------+------------++ AV Area (Vmax): 2.25 cm  +------------------+------------++ AV Area (Vmean): 2.15 cm  +------------------+------------++ AV Area (VTI): 2.68 cm  +------------------+------------++ AV Vmax: 419.75 cm/s  +------------------+------------++ AV Vmean: 293.750 cm/s +------------------+------------++ AV VTI: 0.970 m  +------------------+------------++ AV Peak Grad: 70.5 mmHg  +------------------+------------++ AV Mean Grad: 45.3 mmHg  +------------------+------------++ LVOT Vmax: 248.00 cm/s  +------------------+------------++ LVOT Vmean: 166.000 cm/s +------------------+------------++ LVOT VTI: 0.683 m  +------------------+------------++ LVOT/AV VTI ratio:0.70  +------------------+------------++ AR PHT: 229 msec  +------------------+------------++  +-------------+-------++ AORTA   +-------------+-------++ Ao Root diam:3.40  cm +-------------+-------++ Ao Asc diam: 3.80 cm +-------------+-------++  +--------------+----------++ +---------------+-----------++ MITRAL VALVE   TRICUSPID VALVE  +--------------+----------++ +---------------+-----------++ MV Area (PHT):1.1 cm  TR Peak grad: 31.4 mmHg  +--------------+----------++ +---------------+-----------++ MV Peak grad: 27.9 mmHg  TR Vmax: 343.00 cm/s +--------------+----------++ +---------------+-----------++ MV Mean grad: 13.0 mmHg  +--------------+----------++ +--------------+-------+ MV Vmax: 2.64 m/s  SHUNTS   +--------------+----------++ +--------------+-------+ MV Vmean: 166.0 cm/s Systemic VTI: 0.68 m  +--------------+----------++ +--------------+-------+ MV VTI: 0.77 m  Systemic Diam:2.20 cm +--------------+----------++ +--------------+-------+ MV PHT: 202 msec  +--------------+----------++ MV Decel Time:398 msec  +--------------+----------++ +---------------+-----------++ MR Peak grad: 143.0 mmHg  +---------------+-----------++ MR Mean grad: 87.0 mmHg  +---------------+-----------++ MR Vmax: 598.00 cm/s +---------------+-----------++ MR Vmean: 434.0 cm/s  +---------------+-----------++ MR PISA: 1.01 cm  +---------------+-----------++  MR PISA Radius:0.40 cm  +---------------+-----------++ +--------------+-----------++ MV E velocity:214.00 cm/s +--------------+-----------++ MV A velocity:235.00 cm/s +--------------+-----------++ MV E/A ratio: 0.91  +--------------+-----------++   Labs/Other Tests and Data Reviewed:     BMET    Component Value Date/Time   NA 141 09/12/2018 1056   K 4.4 09/12/2018 1056   CL 103 09/12/2018 1056   CO2 22 09/12/2018 1056   GLUCOSE 85 09/12/2018 1056   GLUCOSE 95  12/08/2015 0848   BUN 17 09/12/2018 1056   CREATININE 0.93 09/12/2018 1056   CREATININE 0.92 12/08/2015 0848   CALCIUM 9.4 09/12/2018 1056   GFRNONAA 85 09/12/2018 1056   GFRAA 98 09/12/2018 1056   CBC    Component Value Date/Time   WBC 7.2 09/12/2018 1056   WBC 6.5 12/08/2015 0848   RBC 4.93 09/12/2018 1056   RBC 5.25 12/08/2015 0848   HGB 13.9 09/12/2018 1056   HCT 41.8 09/12/2018 1056   PLT 209 09/12/2018 1056   MCV 85 09/12/2018 1056   MCH 28.2 09/12/2018 1056   MCH 29.1 12/08/2015 0848   MCHC 33.3 09/12/2018 1056   MCHC 34.5 12/08/2015 0848   RDW 14.1 09/12/2018 1056   LYMPHSABS 1.3 04/28/2018 1020   MONOABS 0.6 03/25/2015 0001   EOSABS 0.3 04/28/2018 1020   BASOSABS 0.0 04/28/2018 1020      Objective:   Vitals in nursing record   General: Well developed, well nourished, NAD  HEENT: OP clear, mucus membranes moist  SKIN: warm, dry. No rashes. Neuro: No focal deficits  Musculoskeletal: Muscle strength 5/5 all ext  Psychiatric: Mood and affect normal  Neck: No JVD, no carotid bruits, no thyromegaly, no lymphadenopathy.  Lungs:Clear bilaterally, no wheezes, rhonci, crackles Cardiovascular: Regular rate and rhythm. Systolic murmur.  Abdomen:Soft. Bowel sounds present. Non-tender.  Extremities: No lower extremity edema. Pulses are 2 + in the bilateral DP/PT.    ASSESSMENT & PLAN:    1. Severe Aortic stenosis 2. Severe mitral valve stenosis  Right and left heart cath today then surgical referral.   I have reviewed the risks, indications, and alternatives to cardiac catheterization, possible angioplasty, and stenting with the patient. Risks include but are not limited to bleeding, infection, vascular injury, stroke, myocardial infection, arrhythmia, kidney injury, radiation-related injury in the case of prolonged fluoroscopy use, emergency cardiac surgery, and death. The patient understands the risks of serious complication is 1-2 in 1308 with diagnostic cardiac  cath and 1-2% or less with angioplasty/stenting.   Lauree Chandler 09/15/2018 8:34 AM

## 2018-09-15 NOTE — Interval H&P Note (Signed)
History and Physical Interval Note:  09/15/2018 8:35 AM  [name]  has presented today for cardiac cath with the diagnosis of severe mitral and aortic stenosis.  The various methods of treatment have been discussed with the patient and family. After consideration of risks, benefits and other options for treatment, the patient has consented to  Procedure(s): RIGHT/LEFT HEART CATH AND CORONARY ANGIOGRAPHY (N/A) as a surgical intervention.  The patient's history has been reviewed, patient examined, no change in status, stable for surgery.  I have reviewed the patient's chart and labs.  Questions were answered to the patient's satisfaction.    Cath Lab Visit (complete for each Cath Lab visit)  Clinical Evaluation Leading to the Procedure:   ACS: No.  Non-ACS:    Anginal Classification: No Symptoms  Anti-ischemic medical therapy: No Therapy  Non-Invasive Test Results: No non-invasive testing performed  Prior CABG: No previous CABG         Lauree Chandler

## 2018-09-15 NOTE — Discharge Instructions (Signed)
Drink plenty of fluids. Keep right arm at or above heat level  Radial Site Care  This sheet gives you information about how to care for yourself after your procedure. Your health care provider may also give you more specific instructions. If you have problems or questions, contact your health care provider. What can I expect after the procedure? After the procedure, it is common to have:  Bruising and tenderness at the catheter insertion area. Follow these instructions at home: Medicines  Take over-the-counter and prescription medicines only as told by your health care provider. Insertion site care  Follow instructions from your health care provider about how to take care of your insertion site. Make sure you: ? Wash your hands with soap and water before you change your bandage (dressing). If soap and water are not available, use hand sanitizer. ? Change your dressing as told by your health care provider. ? Leave stitches (sutures), skin glue, or adhesive strips in place. These skin closures may need to stay in place for 2 weeks or longer. If adhesive strip edges start to loosen and curl up, you may trim the loose edges. Do not remove adhesive strips completely unless your health care provider tells you to do that.  Check your insertion site every day for signs of infection. Check for: ? Redness, swelling, or pain. ? Fluid or blood. ? Pus or a bad smell. ? Warmth.  Do not take baths, swim, or use a hot tub until your health care provider approves.  You may shower 24-48 hours after the procedure, or as directed by your health care provider. ? Remove the dressing and gently wash the site with plain soap and water. ? Pat the area dry with a clean towel. ? Do not rub the site. That could cause bleeding.  Do not apply powder or lotion to the site. Activity   For 24 hours after the procedure, or as directed by your health care provider: ? Do not flex or bend the affected arm. ? Do  not push or pull heavy objects with the affected arm. ? Do not drive yourself home from the hospital or clinic. You may drive 24 hours after the procedure unless your health care provider tells you not to. ? Do not operate machinery or power tools.  Do not lift anything that is heavier than 10 lb (4.5 kg), or the limit that you are told, until your health care provider says that it is safe.  Ask your health care provider when it is okay to: ? Return to work or school. ? Resume usual physical activities or sports. ? Resume sexual activity. General instructions  If the catheter site starts to bleed, raise your arm and put firm pressure on the site. If the bleeding does not stop, get help right away. This is a medical emergency.  If you went home on the same day as your procedure, a responsible adult should be with you for the first 24 hours after you arrive home.  Keep all follow-up visits as told by your health care provider. This is important. Contact a health care provider if:  You have a fever.  You have redness, swelling, or yellow drainage around your insertion site. Get help right away if:  You have unusual pain at the radial site.  The catheter insertion area swells very fast.  The insertion area is bleeding, and the bleeding does not stop when you hold steady pressure on the area.  Your arm or hand  becomes pale, cool, tingly, or numb. °These symptoms may represent a serious problem that is an emergency. Do not wait to see if the symptoms will go away. Get medical help right away. Call your local emergency services (911 in the [place] Do not drive yourself to the hospital. °Summary °· After the procedure, it is common to have bruising and tenderness at the site. °· Follow instructions from your health care provider about how to take care of your radial site wound. Check the wound every day for signs of infection. °· Do not lift anything that is heavier than 10 lb (4.5 kg), or the  limit that you are told, until your health care provider says that it is safe. °This information is not intended to replace advice given to you by your health care provider. Make sure you discuss any questions you have with your health care provider. °Document Released: 02/27/2010 Document Revised: 03/02/2017 Document Reviewed: 03/02/2017 °Elsevier Patient Education © 2020 [employer] ° °

## 2018-09-18 ENCOUNTER — Encounter: Payer: Self-pay | Admitting: *Deleted

## 2018-09-18 ENCOUNTER — Telehealth: Payer: Self-pay | Admitting: *Deleted

## 2018-09-18 DIAGNOSIS — I050 Rheumatic mitral stenosis: Secondary | ICD-10-CM

## 2018-09-18 DIAGNOSIS — I2510 Atherosclerotic heart disease of native coronary artery without angina pectoris: Secondary | ICD-10-CM

## 2018-09-18 DIAGNOSIS — I350 Nonrheumatic aortic (valve) stenosis: Secondary | ICD-10-CM

## 2018-09-18 MED ORDER — METOPROLOL TARTRATE 100 MG PO TABS: ORAL_TABLET | ORAL | 0 refills | Status: DC

## 2018-09-18 NOTE — Telephone Encounter (Signed)
  Rexene Alberts, MD         I would like gated CTA prior to surgery, but it doesn't need to be done prior to his visit with me    I spoke with pt and gave him information regarding gated cardiac CTA.  I verbally went over all instructions with pt and will send copy to him through my chart Heart rate runs in 80's per notes and pt confirms this.  Will send prescription for lopressor to [facility] in [place]. Pt aware our office will contact him with date/time of CT.  Pt would like this done at [facility].

## 2018-09-19 ENCOUNTER — Telehealth: Payer: Self-pay | Admitting: Cardiovascular Disease

## 2018-09-19 DIAGNOSIS — C61 Malignant neoplasm of prostate: Secondary | ICD-10-CM | POA: Diagnosis not present

## 2018-09-19 DIAGNOSIS — R011 Cardiac murmur, unspecified: Secondary | ICD-10-CM | POA: Diagnosis not present

## 2018-09-19 NOTE — Telephone Encounter (Signed)
New message   Patient states that he needs a letter written stating it is okay to go back to work. Please call.

## 2018-09-19 NOTE — Telephone Encounter (Signed)
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 He requests the note specify any limitations or restrictions.   To Dr. Angelena Form.   The patient requests a call 8/12 with an update if possible. He will have the fax number available at that time to send the letter.

## 2018-09-21 ENCOUNTER — Encounter: Payer: Self-pay | Admitting: Cardiovascular Disease

## 2018-09-21 ENCOUNTER — Encounter: Payer: Self-pay | Admitting: *Deleted

## 2018-09-21 NOTE — Telephone Encounter (Signed)
Spoke with the pt and informed him that his letter was written and sent to him via his active mychart account.  Pt verbalized understanding and agrees with this plan. Pt states he found this in mychart, and everything looks perfect.  Pt was more than gracious for all the assistance provided.

## 2018-09-21 NOTE — Telephone Encounter (Signed)
[name], Can we help him get the letter I wrote this morning. THanks, chris

## 2018-09-25 ENCOUNTER — Institutional Professional Consult (permissible substitution): Payer: Medicare HMO | Admitting: Thoracic Surgery (Cardiothoracic Vascular Surgery)

## 2018-09-25 ENCOUNTER — Other Ambulatory Visit: Payer: Self-pay | Admitting: Thoracic Surgery (Cardiothoracic Vascular Surgery)

## 2018-09-25 ENCOUNTER — Encounter: Payer: Self-pay | Admitting: Thoracic Surgery (Cardiothoracic Vascular Surgery)

## 2018-09-25 ENCOUNTER — Other Ambulatory Visit: Payer: Self-pay

## 2018-09-25 VITALS — BP 106/68 | HR 92 | Temp 97.7°F | Resp 18 | Ht 69.0 in | Wt 226.0 lb

## 2018-09-25 DIAGNOSIS — I050 Rheumatic mitral stenosis: Secondary | ICD-10-CM

## 2018-09-25 DIAGNOSIS — I2510 Atherosclerotic heart disease of native coronary artery without angina pectoris: Secondary | ICD-10-CM | POA: Diagnosis not present

## 2018-09-25 DIAGNOSIS — I350 Nonrheumatic aortic (valve) stenosis: Secondary | ICD-10-CM | POA: Diagnosis not present

## 2018-09-25 NOTE — Patient Instructions (Signed)
Continue all previous medications without any changes at this time  

## 2018-09-25 NOTE — Progress Notes (Unsigned)
c 

## 2018-09-25 NOTE — Progress Notes (Signed)
[address]       [place] [address]             [phone]     CARDIOTHORACIC SURGERY CONSULTATION REPORT  Referring Provider is Lauree Chandler, MD PCP is Denita Lung, MD  Chief Complaint  Patient presents with   Mitral Regurgitation    new patient consultation, CABG, AVR, MVR, review cath 09/15/2018   Aortic Stenosis   Coronary Artery Disease    HPI:  Patient is a 68 year old moderately obese male with history of aortic stenosis, mitral stenosis, hyperlipidemia, previous thymectomy with radiation therapy to the chest, and longstanding tobacco abuse who has been referred for surgical consultation to discuss treatment options for management of severe symptomatic aortic stenosis, mitral stenosis, and multivessel coronary artery disease.  Patient states that he was first noted to have a heart murmur on physical exam 4 or 5 years ago.  Echocardiograms have documented the presence of aortic stenosis and mitral stenosis with normal left ventricular systolic function.  The patient has been followed for the last several years by Dr. Angelena Form and recent follow-up transthoracic echocardiogram performed Jun 27, 2018 revealed significant progression of disease including severe aortic stenosis and severe mitral stenosis.  Left ventricular systolic function remains normal with ejection fraction estimated greater than 65%.  Peak velocity across the aortic valve measured 4.2 m/s corresponding to mean transvalvular gradient estimated 45 mmHg.  Mean gradient across mitral valve with estimated 13 mmHg corresponding to mitral valve area 1.1 cm using pressure half-time.  Left and right heart catheterization was performed September 15, 2018 and confirmed the presence of severe aortic stenosis with peak to peak and mean transvalvular gradients measured 32 and 34 mmHg, respectively.  There was severe multivessel coronary artery disease with long segment proximal and mid stenosis  of the left anterior descending coronary artery and 100% chronic occlusion of the right coronary artery.  There was no significant flow-limiting disease in the left circumflex territory.  Right heart pressures were moderately elevated.  Cardiothoracic surgical consultation was requested.  Patient is married and lives in [place], [place] with his wife.  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  He does not exercise on a regular basis and he lives a somewhat sedentary lifestyle when he is not at work.  Her, he does not report any significant limitations other than exertional shortness of breath.  He does admit to shortness of breath with more strenuous exertion such as walking a long distance.  He does not get short of breath with low-level activity and he denies any history of resting shortness of breath, PND, orthopnea, or lower extremity edema.  He has never had any change in her chest tightness either with activity or at rest.  Past Medical History:  Diagnosis Date   BCE (basal cell epithelioma), arm    RIGHT SHOULDER   Dyslipidemia    Esophageal stricture    Hyperlipidemia    Inguinal hernia    bilateral   Obesity    Smoker     Past Surgical History:  Procedure Laterality Date   COLONOSCOPY  03/2008,2004   Dr. Amedeo Plenty   HERNIA REPAIR  12/11/10   BI[facility]   RIGHT/LEFT HEART CATH AND CORONARY ANGIOGRAPHY N/A 09/15/2018   Procedure: RIGHT/LEFT HEART CATH AND CORONARY ANGIOGRAPHY;  Surgeon: Burnell Blanks, MD;  Location: [facility];  Service: Cardiovascular;  Laterality: N/A;   TEE  WITHOUT CARDIOVERSION N/A 12/12/2015   Procedure: TRANSESOPHAGEAL ECHOCARDIOGRAM (TEE);  Surgeon: Sanda Klein, MD;  Location: [facility];  Service: Cardiovascular;  Laterality: N/A;   THYMECTOMY  1976    Family History  Problem Relation Age of Onset   Cancer Mother 82       colon   Cancer Father 44       lung   CAD Neg Hx     Social  History   Socioeconomic History   Marital status: Married    Spouse name: Not on file   Number of children: 0   Years of education: Not on file   Highest education level: Not on file  Occupational History   Occupation: Programme researcher, broadcasting/film/video: Psychiatrist strain: Not on file   Food insecurity    Worry: Not on file    Inability: Not on file   Transportation needs    Medical: Not on file    Non-medical: Not on file  Tobacco Use   Smoking status: Former Smoker    Packs/day: 0.25    Years: 35.00    Pack years: 8.75    Types: Cigarettes    Quit date: 09/04/2018    Years since quitting: 0.0   Smokeless tobacco: Never Used  Substance and Sexual Activity   Alcohol use: Yes    Alcohol/week: 1.0 standard drinks    Types: 1 Cans of beer per week   Drug use: No   Sexual activity: Yes  Lifestyle   Physical activity    Days per week: Not on file    Minutes per session: Not on file   Stress: Not on file  Relationships   Social connections    Talks on phone: Not on file    Gets together: Not on file    Attends religious service: Not on file    Active member of club or organization: Not on file    Attends meetings of clubs or organizations: Not on file    Relationship status: Not on file   Intimate partner violence    Fear of current or ex partner: Not on file    Emotionally abused: Not on file    Physically abused: Not on file    Forced sexual activity: Not on file  Other Topics Concern   Not on file  Social History Narrative   Not on file    Current Outpatient Medications  Medication Sig Dispense Refill   aspirin 81 MG tablet Take 81 mg by mouth every other day.      ibuprofen (ADVIL) 200 MG tablet Take 400 mg by mouth every 6 (six) hours as needed for moderate pain.     metoprolol tartrate (LOPRESSOR) 100 MG tablet Take one tablet by mouth two hours prior to CT 1 tablet 0   oxymetazoline (AFRIN) 0.05 % nasal  spray Place 1 spray into both nostrils at bedtime as needed for congestion.     No current facility-administered medications for this visit.     No Known Allergies    Review of Systems:   General:  normal appetite, normal energy, no weight gain, no weight loss, no fever  Cardiac:  no chest pain with exertion, no chest pain at rest, +SOB with exertion, no resting SOB, no PND, no orthopnea, no palpitations, no arrhythmia, no atrial fibrillation, no LE edema, no dizzy spells, no syncope  Respiratory:  no shortness of breath, no home oxygen, no productive cough, no  dry cough, no bronchitis, no wheezing, no hemoptysis, no asthma, no pain with inspiration or cough, no sleep apnea, no CPAP at night  GI:   no difficulty swallowing, no reflux, no frequent heartburn, no hiatal hernia, no abdominal pain, no constipation, no diarrhea, no hematochezia, no hematemesis, no melena  GU:   no dysuria,  no frequency, no urinary tract infection, no hematuria, no enlarged prostate, no kidney stones, no kidney disease  Vascular:  no pain suggestive of claudication, no pain in feet, no leg cramps, no varicose veins, no DVT, no non-healing foot ulcer  Neuro:   no stroke, no TIA's, no seizures, no headaches, no temporary blindness one eye,  no slurred speech, no peripheral neuropathy, no chronic pain, no instability of gait, no memory/cognitive dysfunction  Musculoskeletal: + arthritis particularly right knee, no joint swelling, no myalgias, no difficulty walking, normal mobility   Skin:   no rash, no itching, no skin infections, no pressure sores or ulcerations  Psych:   no anxiety, no depression, no nervousness, no unusual recent stress  Eyes:   no blurry vision, no floaters, no recent vision changes, + wears glasses or contacts  ENT:   no hearing loss, no loose or painful teeth, no dentures, last saw dentist 09/20/2018  Hematologic:  + easy bruising, no abnormal bleeding, no clotting disorder, no frequent  epistaxis  Endocrine:  no diabetes, does not check CBG's at home     Physical Exam:   BP 106/68 (BP Location: Left Arm, Patient Position: Sitting, Cuff Size: Normal)    Pulse 92    Temp 97.7 F (36.5 C)    Resp 18    Ht _0  (1.753 m)    Wt 226 lb (102.5 kg)    SpO2 93% Comment: RA   BMI 33.37 kg/m   General:  Moderately obese,  well-appearing  HEENT:  Unremarkable   Neck:   no JVD, no bruits, no adenopathy   Chest:   clear to auscultation, symmetrical breath sounds, no wheezes, no rhonchi   CV:   RRR, grade III/VI harsh crescendo/decrescendo systolic murmur   Abdomen:  soft, non-tender, no masses   Extremities:  warm, well-perfused, pulses diminished but palpable, no LE edema  Rectal/GU  Deferred  Neuro:   Grossly non-focal and symmetrical throughout  Skin:   Clean and dry, no rashes, no breakdown   Diagnostic Tests:    ECHOCARDIOGRAM REPORT       Patient Name:   Carolan Clines Date of Exam: 06/27/2018 Medical Rec #:  [id number]      Height:       69.0 in Accession #:    [id number]     Weight:       235.2 lb Date of Birth:  14-Jun-1950      BSA:          2.21 m Patient Age:    75 years       BP:           108/76 mmHg Patient Gender: M              HR:           90 bpm. Exam Location:  [address]    Procedure: 2D Echo, 3D Echo, Cardiac Doppler and Color Doppler  Indications:    I35.0 Aortic Stenosis                 I05.0 Mitral Stenosis   History:  Patient has prior history of Echocardiogram examinations, most                 recent 12/27/2017. Aortic Valve Disease and Mitral Valve Disease                 Risk Factors: Dyslipidemia and Current Smoker. Obesity, Aortic                 Stenosis (prior mean gradient 34 mmHG), Mitral Stenosis.   Sonographer:    Deliah Boston RDCS Referring Phys: [place]    1. The left ventricle has hyperdynamic systolic function, with an ejection fraction of >65%. The cavity size was  normal. There is moderately increased left ventricular wall thickness. Left ventricular diastolic Doppler parameters are indeterminate.  2. The right ventricle has normal systolic function. The cavity was normal.  3. Left atrial size was severely dilated.  4. The mitral valve is abnormal. There is severe mitral annular calcification present. Severe mitral valve stenosis.  5. The tricuspid valve is grossly normal.  6. There is mild dilatation of the ascending aorta measuring 38 mm.  7. Vigorous LV systolic function; moderate LVH; mildly dilated ascending aorta; heavily calcified aortic valve with severe AS (mean gradient 45 mmHg) and at least moderate AI (difficult to quantitate due to contamination from mitral inflow); severe MAC  with severe MS (mean gradient 13 mmHg; MVA by pressure halftime 1.1 cm2); severe LAE.  FINDINGS  Left Ventricle: The left ventricle has hyperdynamic systolic function, with an ejection fraction of >65%. The cavity size was normal. There is moderately increased left ventricular wall thickness. Left ventricular diastolic Doppler parameters are  indeterminate.  Right Ventricle: The right ventricle has normal systolic function. The cavity was normal. There is mildly increased right ventricular wall thickness.  Left Atrium: Left atrial size was severely dilated.  Right Atrium: Right atrial size was normal in size. Right atrial pressure is estimated at 3 mmHg.  Interatrial Septum: No atrial level shunt detected by color flow Doppler.  Pericardium: There is no evidence of pericardial effusion.  Mitral Valve: The mitral valve is abnormal. There is severe mitral annular calcification present. Mitral valve regurgitation is trivial by color flow Doppler. Severe mitral valve stenosis.  Tricuspid Valve: The tricuspid valve is grossly normal. Tricuspid valve regurgitation is mild by color flow Doppler.  Aortic Valve: The aortic valve has an indeterminate number of  cusps Severe calcifcation of the aortic valve. Aortic valve regurgitation is moderate by color flow Doppler. There is Severe stenosis of the aortic valve  Pulmonic Valve: The pulmonic valve was grossly normal. Pulmonic valve regurgitation is trivial by color flow Doppler.  Aorta: There is mild dilatation of the ascending aorta measuring 38 mm.  Venous: The inferior vena cava is normal in size with greater than 50% respiratory variability.    +--------------+--------++  LEFT VENTRICLE            +----------------+---------++ +--------------+--------++  Diastology                    PLAX 2D                   +----------------+---------++ +--------------+--------++  LV e' lateral:   5.98 cm/s    LVIDd:         4.50 cm    +----------------+---------++ +--------------+--------++  LV E/e' lateral: 35.8         LVIDs:  2.17 cm    +----------------+---------++ +--------------+--------++  LV e' medial:    6.74 cm/s    LV PW:         1.12 cm    +----------------+---------++ +--------------+--------++  LV E/e' medial:  31.8         LV IVS:        0.80 cm    +----------------+---------++ +--------------+--------++  LVOT diam:     2.20 cm    +--------------+--------++  LV SV:         77 ml      +--------------+--------++  LV SV Index:   33.02      +--------------+--------++  LVOT Area:     3.80 cm   +--------------+--------++                            +--------------+--------++  +---------------+----------++  RIGHT VENTRICLE              +---------------+----------++  RV S prime:     14.00 cm/s   +---------------+----------++  TAPSE (M-mode): 2.1 cm       +---------------+----------++  +---------------+--------++-----------++  LEFT ATRIUM               Index         +---------------+--------++-----------++  LA diam:        5.20 cm   2.35 cm/m    +---------------+--------++-----------++  LA Vol (A2C):   115.0 ml  51.96  ml/m   +---------------+--------++-----------++  LA Vol (A4C):   116.0 ml  52.41 ml/m   +---------------+--------++-----------++  LA Biplane Vol: 118.0 ml  53.31 ml/m   +---------------+--------++-----------++ +------------+---------++-----------++  RIGHT ATRIUM            Index         +------------+---------++-----------++  RA Area:     20.30 cm                +------------+---------++-----------++  RA Volume:   55.70 ml   25.17 ml/m   +------------+---------++-----------++  +------------------+------------++  AORTIC VALVE                      +------------------+------------++  AV Area (Vmax):    2.25 cm       +------------------+------------++  AV Area (Vmean):   2.15 cm       +------------------+------------++  AV Area (VTI):     2.68 cm       +------------------+------------++  AV Vmax:           419.75 cm/s    +------------------+------------++  AV Vmean:          293.750 cm/s   +------------------+------------++  AV VTI:            0.970 m        +------------------+------------++  AV Peak Grad:      70.5 mmHg      +------------------+------------++  AV Mean Grad:      45.3 mmHg      +------------------+------------++  LVOT Vmax:         248.00 cm/s    +------------------+------------++  LVOT Vmean:        166.000 cm/s   +------------------+------------++  LVOT VTI:          0.683 m        +------------------+------------++  LVOT/AV VTI ratio: 0.70           +------------------+------------++  AR PHT:  229 msec       +------------------+------------++   +-------------+-------++  AORTA                   +-------------+-------++  Ao Root diam: 3.40 cm   +-------------+-------++  Ao Asc diam:  3.80 cm   +-------------+-------++  +--------------+----------++   +---------------+-----------++  MITRAL VALVE                   TRICUSPID VALVE               +--------------+----------++   +---------------+-----------++  MV Area (PHT): 1.1 cm          TR Peak grad:   31.4 mmHg     +--------------+----------++   +---------------+-----------++  MV Peak grad:  27.9 mmHg       TR Vmax:        343.00 cm/s   +--------------+----------++   +---------------+-----------++  MV Mean grad:  13.0 mmHg    +--------------+----------++   +--------------+-------+  MV Vmax:       2.64 m/s        SHUNTS                  +--------------+----------++   +--------------+-------+  MV Vmean:      166.0 cm/s      Systemic VTI:  0.68 m   +--------------+----------++   +--------------+-------+  MV VTI:        0.77 m          Systemic Diam: 2.20 cm  +--------------+----------++   +--------------+-------+  MV PHT:        202 msec     +--------------+----------++  MV Decel Time: 398 msec     +--------------+----------++ +---------------+-----------++  MR Peak grad:   143.0 mmHg    +---------------+-----------++  MR Mean grad:   87.0 mmHg     +---------------+-----------++  MR Vmax:        598.00 cm/s   +---------------+-----------++  MR Vmean:       434.0 cm/s    +---------------+-----------++  MR PISA:        1.01 cm      +---------------+-----------++  MR PISA Radius: 0.40 cm       +---------------+-----------++ +--------------+-----------++  MV E velocity: 214.00 cm/s   +--------------+-----------++  MV A velocity: 235.00 cm/s   +--------------+-----------++  MV E/A ratio:  0.91          +--------------+-----------++    Kirk Ruths MD Electronically signed by Kirk Ruths MD Signature Date/Time: 06/27/2018/1:13:43 PM     RIGHT/LEFT HEART CATH AND CORONARY ANGIOGRAPHY  Conclusion    Prox RCA lesion is 100% stenosed.  Prox Cx to Mid Cx lesion is 30% stenosed.  1st Mrg lesion is 30% stenosed.  3rd Mrg lesion is 30% stenosed.  Prox LAD lesion is 40% stenosed.  Mid LAD lesion is 95% stenosed.   1. Severe stenosis mid LAD 2. Mild non-obstructive disease in the LAD 3. Chronic occlusion of the proximal RCA. Filling of the  proximal, mid and distal RCA from left to right collaterals 4. Severe aortic stenosis (mean gradient 34.1 mmHg, peak to peak gradient 32 mmHg, AVA 1.1 cm2) 5. Severe mitral stenosis by echo.   Recommendations: Will refer to CT surgery for CABG, AVR and MVR. Will discuss arranging gated cardiac CT prior to surgery.    Recommendations  Antiplatelet/Anticoag CT surgery consultation for AVR, MVR and CABG.  Indications  Severe aortic stenosis [I35.0 (ICD-10-CM)]  Severe mitral valve stenosis [  I05.0 (ICD-10-CM)]  Coronary artery disease involving native coronary artery of native heart without angina pectoris [I25.10 (ICD-10-CM)]  Procedural Details  Technical Details Indication: 68 yo male with severe AS, severe MS, normal LV function.   Procedure: The risks, benefits, complications, treatment options, and expected outcomes were discussed with the patient. The patient and/or family concurred with the proposed plan, giving informed consent. The patient was brought to the cath lab after IV hydration was given. The patient was sedated with Versed and Fentanyl. The IV catheter in the right antecubital vein was prepped, draped and changed for a 5 French sheath. Right heart cath performed with a balloon tipped catheter. The right wrist was prepped and draped in a sterile fashion. 1% lidocaine was used for local anesthesia. Using the modified Seldinger access technique, a 5 French sheath was placed in the right radial artery. 3 mg Verapamil was given through the sheath. 5000 units IV heparin was given. Standard diagnostic catheters were used to perform selective coronary angiography. I crossed the aortic valve with an AL-1 catheter and the J wire. The sheath was removed from the right radial artery and a Terumo hemostasis band was applied at the arteriotomy site on the right wrist.    Estimated blood loss <50 mL.   During this procedure medications were administered to achieve and maintain moderate  conscious sedation while the patient's heart rate, blood pressure, and oxygen saturation were continuously monitored and I was present face-to-face 100% of this time.  Medications (Filter: Administrations occurring from 09/15/18 0853 to 09/15/18 0941) (important)  Continuous medications are totaled by the amount administered until 09/15/18 0941.  Medication Rate/Dose/Volume Action  Date Time   Heparin (Porcine) in NaCl 1000-0.9 UT/500ML-% SOLN (mL) 500 mL Given 09/15/18 0859   Total dose as of 09/15/18 0941 500 mL Given 0859   1,000 mL        fentaNYL (SUBLIMAZE) injection (mcg) 50 mcg Given 09/15/18 0904   Total dose as of 09/15/18 0941        50 mcg        midazolam (VERSED) injection (mg) 2 mg Given 09/15/18 0904   Total dose as of 09/15/18 0941        2 mg        lidocaine (PF) (XYLOCAINE) 1 % injection (mL) 2 mL Given 09/15/18 0912   Total dose as of 09/15/18 0941 2 mL Given 0917   4 mL        Radial Cocktail/Verapamil only (mL) 10 mL Given 09/15/18 0918   Total dose as of 09/15/18 0941        10 mL        heparin injection (Units) 5,000 Units Given 09/15/18 0920   Total dose as of 09/15/18 0941        5,000 Units        iohexol (OMNIPAQUE) 350 MG/ML injection (mL) 40 mL Given 09/15/18 0935   Total dose as of 09/15/18 0941        40 mL        Sedation Time  Sedation Time Physician-1: 33 minutes 52 seconds  Complications  Complications documented before study signed (09/15/2018 9:46 AM)   RIGHT/LEFT HEART CATH AND CORONARY ANGIOGRAPHY  None Documented by Burnell Blanks, MD 09/15/2018 9:41 AM  Date Found: 09/15/2018  Time Range: Intraprocedure      Coronary Findings  Diagnostic Dominance: Right Left Anterior Descending  Vessel is large.  Prox LAD lesion 40% stenosed  Prox LAD  lesion is 40% stenosed.  Mid LAD lesion 95% stenosed  Mid LAD lesion is 95% stenosed.  Left Circumflex  Vessel is large.  Prox Cx to Mid Cx lesion 30% stenosed  Prox Cx to Mid Cx  lesion is 30% stenosed.  First Obtuse Marginal Branch  Vessel is large in size.  1st Mrg lesion 30% stenosed  1st Mrg lesion is 30% stenosed.  Third Obtuse Marginal Branch  Vessel is moderate in size.  3rd Mrg lesion 30% stenosed  3rd Mrg lesion is 30% stenosed.  Right Coronary Artery  Vessel is large.  Prox RCA lesion 100% stenosed  Prox RCA lesion is 100% stenosed. The lesion is chronically occluded.  Third Right Posterolateral Branch  Collaterals  3rd RPL filled by collaterals from 2nd Sept.    Intervention  No interventions have been documented. Coronary Diagrams  Diagnostic Dominance: Right  Intervention  Implants   No implant documentation for this case.  Syngo Images  Show images for CARDIAC CATHETERIZATION  Images on Long Term Storage  Show images for Tyshun, Tuckerman to Procedure Log  Procedure Log    Hemo Data   Most Recent Value  Fick Cardiac Output 7.29 L/min  Fick Cardiac Output Index 3.35 (L/min)/BSA  Aortic Mean Gradient 34.14 mmHg  Aortic Peak Gradient 32 mmHg  Aortic Valve Area 1.16  Aortic Value Area Index 0.53 cm2/BSA  RA A Wave 9 mmHg  RA V Wave 7 mmHg  RA Mean 6 mmHg  RV Systolic Pressure 46 mmHg  RV Diastolic Pressure 1 mmHg  RV EDP 7 mmHg  PA Systolic Pressure 46 mmHg  PA Diastolic Pressure 20 mmHg  PA Mean 30 mmHg  PW A Wave 25 mmHg  PW V Wave 33 mmHg  PW Mean 22 mmHg  AO Systolic Pressure 700 mmHg  AO Diastolic Pressure 58 mmHg  AO Mean 81 mmHg  LV Systolic Pressure 174 mmHg  LV Diastolic Pressure 3 mmHg  LV EDP 7 mmHg  AOp Systolic Pressure 944 mmHg  AOp Diastolic Pressure 57 mmHg  AOp Mean Pressure 84 mmHg  LVp Systolic Pressure 967 mmHg  LVp Diastolic Pressure 2 mmHg  LVp EDP Pressure 6 mmHg  QP/QS 1  TPVR Index 8.95 HRUI  TSVR Index 24.18 HRUI  PVR SVR Ratio 0.11  TPVR/TSVR Ratio 0.37       Impression:  Patient has stage D severe symptomatic aortic stenosis and mitral stenosis with severe  multivessel coronary artery disease.  He describes stable symptoms of exertional shortness of breath and fatigue that occur only with more strenuous physical exertion consistent with chronic diastolic congestive heart failure, New York Heart Association functional class II.  I have personally reviewed the patient's recent transthoracic echocardiogram and diagnostic cardiac catheterization.  Echocardiogram reveals normal left ventricular systolic function with at least mild left ventricular hypertrophy.  There is severe aortic stenosis and probably severe mitral stenosis.  The aortic valve is trileaflet.  There is severe thickening and restricted leaflet mobility involving all 3 leaflets.  There is significant calcification.  Peak velocity across aortic valve measured 4.2 m/s corresponding to mean transvalvular gradient estimated well above 40 mmHg.  The mitral valve is also severely thickened with restricted leaflet mobility involving both leaflets throughout the cardiac cycle.  It is unclear whether or not there is significant thickening or foreshortening of the subvalvular apparatus.  There appears to be mild mitral regurgitation and at least mild if not moderate aortic insufficiency.  Is possible that  the patient's underlying valvular heart disease could be rheumatic or it could be related to his previous radiation therapy.  Diagnostic cardiac catheterization revealed severe multivessel coronary artery disease with 95% stenosis of mid left anterior descending coronary artery and 100% chronic occlusion of the right coronary artery.  There is codominant coronary circulation and is unclear whether or not the patient will have suitable target vessel for grafting of the distal right coronary artery territory.  The patient had mild to moderate pulmonary hypertension.  I agree the patient would benefit from aortic valve replacement, mitral valve replacement, and coronary artery bypass grafting.  Risks of surgery will be  slightly elevated because of the patient's history of previous thymectomy and radiation therapy to the chest.   Plan:  The patient and his wife were counseled at length regarding treatment alternatives for management of severe aortic stenosis, mitral stenosis and coronary artery disease including continued medical therapy versus proceeding with surgical intervention in the near future.  The natural history of aortic and mitral stenosis were reviewed, as was long term prognosis with medical therapy alone.  Surgical options were discussed at length including conventional surgical aortic and mitral valve replacement with coronary artery bypass grafting.  Discussion was held comparing the relative risks of mechanical valve replacement with need for lifelong anticoagulation versus use of a bioprosthetic tissue valve and the associated potential for late structural valve deterioration and failure.  This discussion was placed in the context of the patient's particular circumstances, and as a result the patient specifically requests that their valve be replaced using bioprosthetic tissue valves.    Expectations for the patient's postoperative convalescence has been discussed.  The patient hopes to proceed with surgery in the near future.  We tentatively plan for surgery on October 11, 2018.  The patient will undergo cardiac gated CT angiogram of the heart and CT angiogram of the chest, abdomen, and pelvis to further evaluate the anatomy of the aortic root and great vessels.  The patient will undergo pulmonary function testing and he has been reminded to abstain from any cigarette smoking.  He will return to our office for consultation prior to surgery on October 09, 2018.   I spent in excess of 90 minutes during the conduct of this office consultation and >50% of this time involved direct face-to-face encounter with the patient for counseling and/or coordination of their care.    Valentina Gu. Roxy Manns,  MD 09/25/2018 11:18 AM

## 2018-09-26 ENCOUNTER — Other Ambulatory Visit: Payer: Self-pay | Admitting: Thoracic Surgery (Cardiothoracic Vascular Surgery)

## 2018-09-26 ENCOUNTER — Encounter: Payer: Self-pay | Admitting: *Deleted

## 2018-09-26 ENCOUNTER — Other Ambulatory Visit: Payer: Self-pay | Admitting: *Deleted

## 2018-09-26 DIAGNOSIS — I050 Rheumatic mitral stenosis: Secondary | ICD-10-CM

## 2018-09-26 DIAGNOSIS — I350 Nonrheumatic aortic (valve) stenosis: Secondary | ICD-10-CM

## 2018-09-26 DIAGNOSIS — I2510 Atherosclerotic heart disease of native coronary artery without angina pectoris: Secondary | ICD-10-CM

## 2018-10-03 ENCOUNTER — Telehealth (HOSPITAL_COMMUNITY): Payer: Self-pay | Admitting: Emergency Medicine

## 2018-10-03 NOTE — Telephone Encounter (Signed)
Reaching out to patient to offer assistance regarding upcoming cardiac imaging study; pt verbalizes understanding of appt date/time, parking situation and where to check in, pre-test NPO status and medications ordered, and verified current allergies; name and call back number provided for further questions should they arise Torian Quintero RN Navigator Cardiac Imaging [facility] [phone] office [phone] cell 

## 2018-10-04 ENCOUNTER — Ambulatory Visit (HOSPITAL_COMMUNITY)
Admission: RE | Admit: 2018-10-04 | Discharge: 2018-10-04 | Disposition: A | Discharge status: Home / Self Care | Payer: Medicare HMO | Source: Ambulatory Visit | Attending: Thoracic Surgery (Cardiothoracic Vascular Surgery) | Admitting: Thoracic Surgery (Cardiothoracic Vascular Surgery)

## 2018-10-04 ENCOUNTER — Other Ambulatory Visit: Payer: Self-pay

## 2018-10-04 ENCOUNTER — Encounter (HOSPITAL_COMMUNITY): Payer: Self-pay

## 2018-10-04 ENCOUNTER — Other Ambulatory Visit (HOSPITAL_COMMUNITY)
Admission: RE | Admit: 2018-10-04 | Discharge: 2018-10-04 | Disposition: A | Discharge status: Home / Self Care | Payer: Medicare HMO | Source: Ambulatory Visit | Attending: Thoracic Surgery (Cardiothoracic Vascular Surgery) | Admitting: Thoracic Surgery (Cardiothoracic Vascular Surgery)

## 2018-10-04 DIAGNOSIS — I77810 Thoracic aortic ectasia: Secondary | ICD-10-CM | POA: Diagnosis not present

## 2018-10-04 DIAGNOSIS — R911 Solitary pulmonary nodule: Secondary | ICD-10-CM

## 2018-10-04 DIAGNOSIS — Z20828 Contact with and (suspected) exposure to other viral communicable diseases: Secondary | ICD-10-CM | POA: Diagnosis not present

## 2018-10-04 DIAGNOSIS — Z951 Presence of aortocoronary bypass graft: Secondary | ICD-10-CM | POA: Insufficient documentation

## 2018-10-04 DIAGNOSIS — I2510 Atherosclerotic heart disease of native coronary artery without angina pectoris: Secondary | ICD-10-CM | POA: Insufficient documentation

## 2018-10-04 DIAGNOSIS — I700 Atherosclerosis of aorta: Secondary | ICD-10-CM | POA: Insufficient documentation

## 2018-10-04 DIAGNOSIS — Z01812 Encounter for preprocedural laboratory examination: Secondary | ICD-10-CM | POA: Diagnosis not present

## 2018-10-04 DIAGNOSIS — I719 Aortic aneurysm of unspecified site, without rupture: Secondary | ICD-10-CM | POA: Diagnosis not present

## 2018-10-04 DIAGNOSIS — I050 Rheumatic mitral stenosis: Secondary | ICD-10-CM | POA: Insufficient documentation

## 2018-10-04 DIAGNOSIS — I350 Nonrheumatic aortic (valve) stenosis: Secondary | ICD-10-CM | POA: Insufficient documentation

## 2018-10-04 HISTORY — DX: Solitary pulmonary nodule: R91.1

## 2018-10-04 LAB — SARS CORONAVIRUS 2 (TAT 6-24 HRS): SARS Coronavirus 2: NEGATIVE

## 2018-10-04 MED ORDER — IOHEXOL 350 MG/ML SOLN
100.0000 mL | Freq: Once | INTRAVENOUS | Status: AC | PRN
  Administered 2018-10-04: 100 mL via INTRAVENOUS

## 2018-10-05 ENCOUNTER — Encounter: Payer: Self-pay | Admitting: Thoracic Surgery (Cardiothoracic Vascular Surgery)

## 2018-10-05 ENCOUNTER — Other Ambulatory Visit (HOSPITAL_COMMUNITY): Payer: Medicare HMO

## 2018-10-09 ENCOUNTER — Other Ambulatory Visit: Payer: Self-pay

## 2018-10-09 ENCOUNTER — Ambulatory Visit (HOSPITAL_BASED_OUTPATIENT_CLINIC_OR_DEPARTMENT_OTHER)
Admission: RE | Admit: 2018-10-09 | Discharge: 2018-10-09 | Disposition: A | Discharge status: Home / Self Care | Payer: Medicare HMO | Source: Ambulatory Visit | Attending: Thoracic Surgery (Cardiothoracic Vascular Surgery) | Admitting: Thoracic Surgery (Cardiothoracic Vascular Surgery)

## 2018-10-09 ENCOUNTER — Ambulatory Visit: Payer: Medicare HMO | Admitting: Thoracic Surgery (Cardiothoracic Vascular Surgery)

## 2018-10-09 ENCOUNTER — Ambulatory Visit (HOSPITAL_COMMUNITY)
Admission: RE | Admit: 2018-10-09 | Discharge: 2018-10-09 | Disposition: A | Discharge status: Home / Self Care | Payer: Medicare HMO | Source: Ambulatory Visit | Attending: Thoracic Surgery (Cardiothoracic Vascular Surgery) | Admitting: Thoracic Surgery (Cardiothoracic Vascular Surgery)

## 2018-10-09 ENCOUNTER — Other Ambulatory Visit (HOSPITAL_COMMUNITY)
Admission: RE | Admit: 2018-10-09 | Discharge: 2018-10-09 | Disposition: A | Discharge status: Home / Self Care | Payer: Medicare HMO | Source: Ambulatory Visit | Attending: Thoracic Surgery (Cardiothoracic Vascular Surgery) | Admitting: Thoracic Surgery (Cardiothoracic Vascular Surgery)

## 2018-10-09 ENCOUNTER — Encounter: Payer: Self-pay | Admitting: Thoracic Surgery (Cardiothoracic Vascular Surgery)

## 2018-10-09 ENCOUNTER — Other Ambulatory Visit (HOSPITAL_COMMUNITY): Payer: Medicare HMO

## 2018-10-09 ENCOUNTER — Encounter (HOSPITAL_COMMUNITY): Payer: Self-pay

## 2018-10-09 ENCOUNTER — Encounter (HOSPITAL_COMMUNITY)
Admission: RE | Admit: 2018-10-09 | Discharge: 2018-10-09 | Disposition: A | Discharge status: Home / Self Care | Payer: Medicare HMO | Source: Ambulatory Visit | Attending: Thoracic Surgery (Cardiothoracic Vascular Surgery) | Admitting: Thoracic Surgery (Cardiothoracic Vascular Surgery)

## 2018-10-09 VITALS — BP 119/73 | HR 87 | Temp 96.6°F | Resp 16 | Ht 69.0 in | Wt 226.0 lb

## 2018-10-09 DIAGNOSIS — E785 Hyperlipidemia, unspecified: Secondary | ICD-10-CM | POA: Diagnosis present

## 2018-10-09 DIAGNOSIS — Z23 Encounter for immunization: Secondary | ICD-10-CM | POA: Diagnosis not present

## 2018-10-09 DIAGNOSIS — J9811 Atelectasis: Secondary | ICD-10-CM | POA: Diagnosis not present

## 2018-10-09 DIAGNOSIS — I442 Atrioventricular block, complete: Secondary | ICD-10-CM | POA: Diagnosis not present

## 2018-10-09 DIAGNOSIS — Z01810 Encounter for preprocedural cardiovascular examination: Secondary | ICD-10-CM

## 2018-10-09 DIAGNOSIS — I080 Rheumatic disorders of both mitral and aortic valves: Secondary | ICD-10-CM | POA: Diagnosis not present

## 2018-10-09 DIAGNOSIS — J939 Pneumothorax, unspecified: Secondary | ICD-10-CM | POA: Diagnosis not present

## 2018-10-09 DIAGNOSIS — E669 Obesity, unspecified: Secondary | ICD-10-CM | POA: Diagnosis present

## 2018-10-09 DIAGNOSIS — I342 Nonrheumatic mitral (valve) stenosis: Secondary | ICD-10-CM | POA: Diagnosis not present

## 2018-10-09 DIAGNOSIS — Q211 Atrial septal defect: Secondary | ICD-10-CM | POA: Diagnosis not present

## 2018-10-09 DIAGNOSIS — I350 Nonrheumatic aortic (valve) stenosis: Secondary | ICD-10-CM | POA: Diagnosis not present

## 2018-10-09 DIAGNOSIS — D6959 Other secondary thrombocytopenia: Secondary | ICD-10-CM | POA: Diagnosis not present

## 2018-10-09 DIAGNOSIS — I700 Atherosclerosis of aorta: Secondary | ICD-10-CM | POA: Diagnosis present

## 2018-10-09 DIAGNOSIS — I358 Other nonrheumatic aortic valve disorders: Secondary | ICD-10-CM | POA: Diagnosis not present

## 2018-10-09 DIAGNOSIS — Z951 Presence of aortocoronary bypass graft: Secondary | ICD-10-CM | POA: Diagnosis not present

## 2018-10-09 DIAGNOSIS — Z950 Presence of cardiac pacemaker: Secondary | ICD-10-CM | POA: Diagnosis not present

## 2018-10-09 DIAGNOSIS — J9859 Other diseases of mediastinum, not elsewhere classified: Secondary | ICD-10-CM | POA: Diagnosis not present

## 2018-10-09 DIAGNOSIS — I2510 Atherosclerotic heart disease of native coronary artery without angina pectoris: Secondary | ICD-10-CM | POA: Diagnosis present

## 2018-10-09 DIAGNOSIS — Z20828 Contact with and (suspected) exposure to other viral communicable diseases: Secondary | ICD-10-CM | POA: Diagnosis not present

## 2018-10-09 DIAGNOSIS — I050 Rheumatic mitral stenosis: Secondary | ICD-10-CM

## 2018-10-09 DIAGNOSIS — Z800 Family history of malignant neoplasm of digestive organs: Secondary | ICD-10-CM | POA: Diagnosis not present

## 2018-10-09 DIAGNOSIS — I441 Atrioventricular block, second degree: Secondary | ICD-10-CM | POA: Diagnosis not present

## 2018-10-09 DIAGNOSIS — I2582 Chronic total occlusion of coronary artery: Secondary | ICD-10-CM | POA: Diagnosis not present

## 2018-10-09 DIAGNOSIS — Z6836 Body mass index (BMI) 36.0-36.9, adult: Secondary | ICD-10-CM | POA: Diagnosis not present

## 2018-10-09 DIAGNOSIS — I340 Nonrheumatic mitral (valve) insufficiency: Secondary | ICD-10-CM | POA: Diagnosis not present

## 2018-10-09 DIAGNOSIS — I272 Pulmonary hypertension, unspecified: Secondary | ICD-10-CM | POA: Diagnosis present

## 2018-10-09 DIAGNOSIS — Z87891 Personal history of nicotine dependence: Secondary | ICD-10-CM | POA: Diagnosis not present

## 2018-10-09 DIAGNOSIS — E782 Mixed hyperlipidemia: Secondary | ICD-10-CM | POA: Diagnosis not present

## 2018-10-09 DIAGNOSIS — I083 Combined rheumatic disorders of mitral, aortic and tricuspid valves: Secondary | ICD-10-CM | POA: Diagnosis not present

## 2018-10-09 DIAGNOSIS — I5033 Acute on chronic diastolic (congestive) heart failure: Secondary | ICD-10-CM | POA: Diagnosis not present

## 2018-10-09 DIAGNOSIS — Z953 Presence of xenogenic heart valve: Secondary | ICD-10-CM | POA: Diagnosis not present

## 2018-10-09 DIAGNOSIS — D62 Acute posthemorrhagic anemia: Secondary | ICD-10-CM | POA: Diagnosis not present

## 2018-10-09 DIAGNOSIS — J90 Pleural effusion, not elsewhere classified: Secondary | ICD-10-CM | POA: Diagnosis not present

## 2018-10-09 DIAGNOSIS — Z7982 Long term (current) use of aspirin: Secondary | ICD-10-CM | POA: Diagnosis not present

## 2018-10-09 DIAGNOSIS — R001 Bradycardia, unspecified: Secondary | ICD-10-CM | POA: Diagnosis not present

## 2018-10-09 DIAGNOSIS — Y842 Radiological procedure and radiotherapy as the cause of abnormal reaction of the patient, or of later complication, without mention of misadventure at the time of the procedure: Secondary | ICD-10-CM | POA: Diagnosis present

## 2018-10-09 DIAGNOSIS — J95811 Postprocedural pneumothorax: Secondary | ICD-10-CM | POA: Diagnosis not present

## 2018-10-09 DIAGNOSIS — Z801 Family history of malignant neoplasm of trachea, bronchus and lung: Secondary | ICD-10-CM | POA: Diagnosis not present

## 2018-10-09 DIAGNOSIS — I483 Typical atrial flutter: Secondary | ICD-10-CM | POA: Diagnosis not present

## 2018-10-09 DIAGNOSIS — Z85828 Personal history of other malignant neoplasm of skin: Secondary | ICD-10-CM | POA: Diagnosis not present

## 2018-10-09 DIAGNOSIS — Z48812 Encounter for surgical aftercare following surgery on the circulatory system: Secondary | ICD-10-CM | POA: Diagnosis not present

## 2018-10-09 LAB — PULMONARY FUNCTION TEST
DL/VA % pred: 85 %
DL/VA: 3.5 ml/min/mmHg/L
DLCO unc % pred: 81 %
DLCO unc: 20.99 ml/min/mmHg
FEF 25-75 Post: 4.03 L/sec
FEF 25-75 Pre: 3.67 L/sec
FEF2575-%Change-Post: 9 %
FEF2575-%Pred-Post: 159 %
FEF2575-%Pred-Pre: 145 %
FEV1-%Change-Post: 2 %
FEV1-%Pred-Post: 104 %
FEV1-%Pred-Pre: 101 %
FEV1-Post: 3.4 L
FEV1-Pre: 3.3 L
FEV1FVC-%Change-Post: 2 %
FEV1FVC-%Pred-Pre: 112 %
FEV6-%Change-Post: 1 %
FEV6-%Pred-Post: 95 %
FEV6-%Pred-Pre: 94 %
FEV6-Post: 3.96 L
FEV6-Pre: 3.9 L
FEV6FVC-%Change-Post: 1 %
FEV6FVC-%Pred-Post: 105 %
FEV6FVC-%Pred-Pre: 104 %
FVC-%Change-Post: 0 %
FVC-%Pred-Post: 90 %
FVC-%Pred-Pre: 90 %
FVC-Post: 3.96 L
FVC-Pre: 3.96 L
Post FEV1/FVC ratio: 86 %
Post FEV6/FVC ratio: 100 %
Pre FEV1/FVC ratio: 83 %
Pre FEV6/FVC Ratio: 98 %
RV % pred: 26 %
RV: 0.61 L
TLC % pred: 69 %
TLC: 4.73 L

## 2018-10-09 LAB — COMPREHENSIVE METABOLIC PANEL
ALT: 16 U/L (ref 0–44)
AST: 18 U/L (ref 15–41)
Albumin: 4.1 g/dL (ref 3.5–5.0)
Alkaline Phosphatase: 56 U/L (ref 38–126)
Anion gap: 13 (ref 5–15)
BUN: 21 mg/dL (ref 8–23)
CO2: 17 mmol/L — ABNORMAL LOW (ref 22–32)
Calcium: 8.9 mg/dL (ref 8.9–10.3)
Chloride: 105 mmol/L (ref 98–111)
Creatinine, Ser: 1.02 mg/dL (ref 0.61–1.24)
GFR calc Af Amer: >60 mL/min (ref >60)
GFR calc non Af Amer: >60 mL/min (ref >60)
Glucose, Bld: 125 mg/dL — ABNORMAL HIGH (ref 70–99)
Potassium: 3.5 mmol/L (ref 3.5–5.1)
Sodium: 135 mmol/L (ref 135–145)
Total Bilirubin: 0.5 mg/dL (ref 0.3–1.2)
Total Protein: 7 g/dL (ref 6.5–8.1)

## 2018-10-09 LAB — SURGICAL PCR SCREEN
MRSA, PCR: NEGATIVE
Staphylococcus aureus: NEGATIVE

## 2018-10-09 LAB — CBC
HCT: 44.4 % (ref 39.0–52.0)
Hemoglobin: 14.5 g/dL (ref 13.0–17.0)
MCH: 29.1 pg (ref 26.0–34.0)
MCHC: 32.7 g/dL (ref 30.0–36.0)
MCV: 89 fL (ref 80.0–100.0)
Platelets: 189 10*3/uL (ref 150–400)
RBC: 4.99 MIL/uL (ref 4.22–5.81)
RDW: 15.1 % (ref 11.5–15.5)
WBC: 7.8 10*3/uL (ref 4.0–10.5)
nRBC: 0 % (ref 0.0–0.2)

## 2018-10-09 LAB — BLOOD GAS, ARTERIAL
Acid-base deficit: 3.3 mmol/L — ABNORMAL HIGH (ref 0.0–2.0)
Bicarbonate: 20.7 mmol/L (ref 20.0–28.0)
Drawn by: 42180
O2 Saturation: 97.8 %
Patient temperature: 98.6
pCO2 arterial: 34.5 mmHg (ref 32.0–48.0)
pH, Arterial: 7.396 (ref 7.350–7.450)
pO2, Arterial: 105 mmHg (ref 83.0–108.0)

## 2018-10-09 LAB — URINALYSIS, ROUTINE W REFLEX MICROSCOPIC
Bilirubin Urine: NEGATIVE
Glucose, UA: NEGATIVE mg/dL
Hgb urine dipstick: NEGATIVE
Ketones, ur: NEGATIVE mg/dL
Leukocytes,Ua: NEGATIVE
Nitrite: NEGATIVE
Protein, ur: NEGATIVE mg/dL
Specific Gravity, Urine: 1.015 (ref 1.005–1.030)
pH: 5 (ref 5.0–8.0)

## 2018-10-09 LAB — SARS CORONAVIRUS 2 (TAT 6-24 HRS): SARS Coronavirus 2: NEGATIVE

## 2018-10-09 LAB — HEMOGLOBIN A1C
Hgb A1c MFr Bld: 5.8 % — ABNORMAL HIGH (ref 4.8–5.6)
Mean Plasma Glucose: 119.76 mg/dL

## 2018-10-09 LAB — PROTIME-INR
INR: 1 (ref 0.8–1.2)
Prothrombin Time: 13.1 seconds (ref 11.4–15.2)

## 2018-10-09 LAB — APTT: aPTT: 31 seconds (ref 24–36)

## 2018-10-09 LAB — ABO/RH: ABO/RH(D): B POS

## 2018-10-09 MED ORDER — ALBUTEROL SULFATE (2.5 MG/3ML) 0.083% IN NEBU
2.5000 mg | INHALATION_SOLUTION | Freq: Once | RESPIRATORY_TRACT | Status: AC
  Administered 2018-10-09: 15:00:00 2.5 mg via RESPIRATORY_TRACT

## 2018-10-09 NOTE — Progress Notes (Signed)
PCP - Jill Alexanders Cardiologist - [facility]  Chest x-ray - 10-09-18 EKG - 09-15-18 ECHO - 06-27-18 Cardiac Cath - 09-15-18   Anesthesia review: N/A  Patient denies shortness of breath, fever, cough and chest pain at PAT appointment   Patient verbalized understanding of instructions that were given to them at the PAT appointment. Patient was also instructed that they will need to review over the PAT instructions again at home before surgery.

## 2018-10-09 NOTE — Progress Notes (Signed)
Pre-CABG testing has been completed. Preliminary results can be found in CV Proc through chart review.   10/09/18 1:47 PM [name] RVT

## 2018-10-09 NOTE — Progress Notes (Signed)
[address] [address]       [place] [address]             [phone]     CARDIOTHORACIC SURGERY OFFICE NOTE  Primary Cardiologist is Lauree Chandler, MD PCP is Denita Lung, MD   HPI:  Patient is a 68 year old moderately obese male with history of aortic stenosis, mitral stenosis, hyperlipidemia, previous thymectomy with radiation therapy to the chest, and longstanding tobacco abuse who returns to the office today with tentative plans to proceed with aortic valve replacement, mitral valve replacement, and coronary artery bypass grafting later this week.  He was originally seen in consultation on September 25, 2018.  Since then he underwent CT angiography and he is scheduled for routine preoperative lab work and pulmonary function tests today.  He reports no new problems or complaints.  He has been practicing social distancing and wearing a mask whenever he is in public.  He has not been exposed to any persons with known or suspected COVID-19 infection.  He denies any fevers or productive cough.  He denies any shortness of breath.   Current Outpatient Medications  Medication Sig Dispense Refill   ibuprofen (ADVIL) 200 MG tablet Take 400 mg by mouth every 6 (six) hours as needed for moderate pain.     nicotine polacrilex (COMMIT) 2 MG lozenge Take 2 mg by mouth as needed for smoking cessation.     oxymetazoline (AFRIN) 0.05 % nasal spray Place 1 spray into both nostrils at bedtime as needed for congestion.     aspirin 81 MG tablet Take 81 mg by mouth every other day.      No current facility-administered medications for this visit.       Physical Exam:   BP 119/73 (BP Location: Left Arm, Patient Position: Sitting, Cuff Size: Normal)    Pulse 87    Temp (!) 96.6 F (35.9 C)    Resp 16    Ht 5\' 9"  (1.753 m)    Wt 226 lb (102.5 kg)    SpO2 93% Comment: RA   BMI 33.37 kg/m   General:  Well-appearing  Chest:   Clear to auscultation  CV:   Regular rate and rhythm  with systolic murmur  Incisions:  n/a  Abdomen:  Soft nontender  Extremities:  Warm and well-perfused  Diagnostic Tests:  Cardiac TAVR CT  TECHNIQUE: The patient was scanned on a [employer]. A 120 kV retrospective scan was triggered in the descending thoracic aorta at 111 HU's. Gantry rotation speed was 250 msecs and collimation was .6 mm. No beta blockade or nitro were given. The 3D data set was reconstructed in 5% intervals of the R-R cycle. Systolic and diastolic phases were analyzed on a dedicated work station using MPR, MIP and VRT modes. The patient received 80 cc of contrast.  FINDINGS: Aortic Valve: Aortic valve is trileaflet with severely thickened and calcified leaflets with severe diffuse calcifications extending into the LVOT and continuing into severe circumferential mitral annular calcifications.  Aorta: Normal size with mild diffuse atherosclerotic plaque and calcifications, no dissection.  Sinotubular Junction: 29 x 28 mm  Ascending Thoracic Aorta: 38 x 37 mm  Aortic Arch: 35 x 34 mm  Descending Thoracic Aorta: 25 x 25 mm  Sinus of Valsalva Measurements:  Non-coronary: 33 mm  Right -coronary: 29 mm  Left -coronary: 32 mm  Coronary Artery Height above Annulus:  Left Main: 16 mm  Right Coronary: 19 mm  Virtual Basal Annulus Measurements:  Maximum/Minimum Diameter: 31.7 x 25.5 mm  Mean Diameter: 28.4 mm  Perimeter: 92.3 mm  Area: 632 mm2  IMPRESSION: 1. Aortic valve is trileaflet with severely thickened and calcified leaflets with severe diffuse calcifications extending into the LVOT and continuing into severe circumferential mitral annular calcifications.  2. Thoracic aorta has normal size with mild diffuse atherosclerotic disease and calcifications.  3. No thrombus in the left atrial appendage.  Electronically Signed: By: Ena Dawley On: 10/04/2018 16:13   CT ANGIOGRAPHY CHEST, ABDOMEN  AND PELVIS  TECHNIQUE: Multidetector CT imaging through the chest, abdomen and pelvis was performed using the standard protocol during bolus administration of intravenous contrast. Multiplanar reconstructed images and MIPs were obtained and reviewed to evaluate the vascular anatomy.  CONTRAST:  192mL OMNIPAQUE IOHEXOL 350 MG/ML SOLN  COMPARISON:  None.  FINDINGS: CTA CHEST FINDINGS  Cardiovascular: Heart size is mildly enlarged with left atrial dilatation. There is no significant pericardial fluid, thickening or pericardial calcification. There is aortic atherosclerosis, as well as atherosclerosis of the great vessels of the mediastinum and the coronary arteries, including calcified atherosclerotic plaque in the left main, left anterior descending, left circumflex and right coronary arteries. Severe thickening calcification of the aortic valve. Severe thickening calcification of the mitral valve and annulus. Ectasia of ascending thoracic aorta (4.0 cm in diameter).  Mediastinum/Lymph Nodes: No pathologically enlarged mediastinal or hilar lymph nodes. Surgical clips in the anterior mediastinum. Moderate sized hiatal hernia. No axillary lymphadenopathy.  Lungs/Pleura: 8 x 6 mm (mean diameter of 7 mm) right upper lobe pulmonary nodule near the apex (axial image 17 of series 8). 4 mm pulmonary nodule in the right lower lobe (axial image 64 of series 8). No acute consolidative airspace disease. No pleural effusions.  Musculoskeletal/Soft Tissues: Multiple chronic appearing compression fractures, most severe at T10 and T11 where there is approximately 40% loss of anterior vertebral body height. There are no aggressive appearing lytic or blastic lesions noted in the visualized portions of the skeleton.  CTA ABDOMEN AND PELVIS FINDINGS  Hepatobiliary: No suspicious cystic or solid hepatic lesions. No intra or extrahepatic biliary ductal dilatation. Gallbladder is normal  in appearance.  Pancreas: No pancreatic mass. No pancreatic ductal dilatation. No pancreatic or peripancreatic fluid collections or inflammatory changes.  Spleen: Unremarkable.  Adrenals/Urinary Tract: Bilateral kidneys and adrenal glands are normal in appearance. No hydroureteronephrosis. Urinary bladder is normal in appearance.  Stomach/Bowel: Normal appearance of the intra-abdominal portion of the stomach. No pathologic dilatation of small bowel or colon. Normal appendix.  Vascular/Lymphatic: Mild aortic atherosclerosis, without evidence of aneurysm or dissection in the abdominal or pelvic vasculature. Vascular findings and measurements pertinent to potential TAVR procedure, as detailed below. No lymphadenopathy noted in the abdomen or pelvis.  Reproductive: Prostate gland and seminal vesicles are unremarkable in appearance.  Other: No significant volume of ascites.  No pneumoperitoneum.  Musculoskeletal: Chronic appearing compression fracture of L1 with 25% loss of anterior vertebral body height. There are no aggressive appearing lytic or blastic lesions noted in the visualized portions of the skeleton.  VASCULAR MEASUREMENTS PERTINENT TO TAVR:  AORTA:  Minimal Aortic Diameter-1.5 x 1.6 mm  Severity of Aortic Calcification-mild  RIGHT PELVIS:  Right Common Iliac Artery -  Minimal Diameter-9.8 x 9.7 mm  Tortuosity-mild  Calcification-none  Right External Iliac Artery -  Minimal Diameter-9.3 x 8.6 mm  Tortuosity-moderate to severe  Calcification - none  Right Common Femoral Artery -  Minimal Diameter-9.5 x 9.7 mm  Tortuosity-mild  Calcification - none  LEFT PELVIS:  Left Common Iliac Artery -  Minimal Diameter-10.2 x 9.9 mm  Tortuosity-mild  Calcification - none  Left External Iliac Artery -  Minimal Diameter-10.1 x 9.1 mm  Tortuosity-moderate to severe  Calcification - none  Left Common Femoral  Artery -  Minimal Diameter-8.8 x 8.8 mm  Tortuosity-mild  Calcification - none  Review of the MIP images confirms the above findings.  IMPRESSION: 1. Vascular findings and measurements pertinent to potential TAVR procedure, as detailed above. 2. Severe thickening calcification of the aortic valve, compatible with reported clinical history of severe aortic stenosis. 3. There is also severe thickening calcification of the mitral valve and annulus. 4. Aortic atherosclerosis, in addition to left main and 3 vessel coronary artery disease. There is also ectasia of the ascending thoracic aorta (4.0 cm in diameter). Recommend annual imaging followup by CTA or MRA. This recommendation follows 2010 ACCF/AHA/AATS/ACR/ASA/SCA/SCAI/SIR/STS/SVM Guidelines for the Diagnosis and Management of Patients with Thoracic Aortic Disease. Circulation. 2010; [id number]. Aortic aneurysm NOS (ICD10-I71.9). 5. Cardiomegaly with left atrial dilatation. 6. Small pulmonary nodules measuring 7 mm or less in size, as above. Non-contrast chest CT at 3-6 months is recommended. If the nodules are stable at time of repeat CT, then future CT at 18-24 months (from today's scan) is considered optional for low-risk patients, but is recommended for high-risk patients. This recommendation follows the consensus statement: Guidelines for Management of Incidental Pulmonary Nodules Detected on CT Images: From the Fleischner Society 2017; Radiology 2017; 284:228-243. 7. Additional incidental findings, as above.   Electronically Signed   By: Vinnie Langton M.D.   On: 10/04/2018 16:49     Impression:  Patient has stage D severe symptomatic aortic stenosis and mitral stenosis with severe multivessel coronary artery disease.  He describes stable symptoms of exertional shortness of breath and fatigue that occur only with more strenuous physical exertion consistent with chronic diastolic congestive heart  failure, New [place] Heart Association functional class II.  I have personally reviewed the patient's recent transthoracic echocardiogram, diagnostic cardiac catheterization, and CT angiograms. Echocardiogram reveals normal left ventricular systolic function with at least mild left ventricular hypertrophy.  There is severe aortic stenosis and probably severe mitral stenosis.  The aortic valve is trileaflet.  There is severe thickening and restricted leaflet mobility involving all 3 leaflets.  There is significant calcification.  Peak velocity across aortic valve measured 4.2 m/s corresponding to mean transvalvular gradient estimated well above 40 mmHg.  The mitral valve is also severely thickened with restricted leaflet mobility involving both leaflets throughout the cardiac cycle.  It is unclear whether or not there is significant thickening or foreshortening of the subvalvular apparatus.  There appears to be mild mitral regurgitation and at least mild if not moderate aortic insufficiency.  Is possible that the patient's underlying valvular heart disease could be rheumatic or it could be related to his previous radiation therapy.  Diagnostic cardiac catheterization revealed severe multivessel coronary artery disease with 95% stenosis of mid left anterior descending coronary artery and 100% chronic occlusion of the right coronary artery.  There is codominant coronary circulation and is unclear whether or not the patient will have suitable target vessel for grafting of the distal right coronary artery territory.  The patient had mild to moderate pulmonary hypertension.  I agree the patient would benefit from aortic valve replacement, mitral valve replacement, and coronary artery bypass grafting.  CT angiography reveals extensive calcification in the aortic root extending into the left  ventricular outflow tract into the mitral annulus.  The aortic root appears adequate sized. Risks of surgery will be slightly elevated  because of the patient's history of previous thymectomy and radiation therapy to the chest.  The patient will need late follow-up CT imaging in 3 to 6 months because of small benign-appearing lung nodules noted on CT.    Plan:  The patient and his wife were again counseled at length regarding treatment alternatives for management of severe aortic stenosis, mitral stenosis and coronary artery disease including continued medical therapy versus proceeding with surgical intervention in the near future.  The natural history of aortic and mitral stenosis were reviewed, as was long term prognosis with medical therapy alone.  Surgical options were discussed at length including conventional surgical aortic and mitral valve replacement with coronary artery bypass grafting.  Discussion was held comparing the relative risks of mechanical valve replacement with need for lifelong anticoagulation versus use of a bioprosthetic tissue valve and the associated potential for late structural valve deterioration and failure.  This discussion was placed in the context of the patient's particular circumstances, and as a result the patient specifically requests that their valve be replaced using bioprosthetic tissue valves.    Expectations for the patient's postoperative convalescence has been discussed.  They understand and accept all potential risks of surgery including but not limited to risk of death, stroke or other neurologic complication, myocardial infarction, congestive heart failure, respiratory failure, renal failure, bleeding requiring transfusion and/or reexploration, arrhythmia, infection or other wound complications, pneumonia, pleural and/or pericardial effusion, pulmonary embolus, aortic dissection or other major vascular complication, or delayed complications related to valve repair or replacement including but not limited to structural valve deterioration and failure, thrombosis, embolization, endocarditis, or  paravalvular leak.  All of their questions have been answered.    I spent in excess of 15 minutes during the conduct of this office consultation and >50% of this time involved direct face-to-face encounter with the patient for counseling and/or coordination of their care.    Valentina Gu. Roxy Manns, MD 10/09/2018 11:33 AM

## 2018-10-09 NOTE — Patient Instructions (Signed)
   Continue taking all current medications without change through the day before surgery.  Have nothing to eat or drink after midnight the night before surgery.  On the morning of surgery do not take any medications   

## 2018-10-09 NOTE — H&P (Signed)
[address]       [place] [address]             [phone]          CARDIOTHORACIC SURGERY HISTORY AND PHYSICAL EXAM  Referring Provider is Lauree Chandler, MD PCP is Denita Lung, MD  Chief Complaint  Patient presents with   Mitral Regurgitation    new patient consultation, CABG, AVR, MVR, review cath 09/15/2018   Aortic Stenosis   Coronary Artery Disease   HPI:  Patient is a 68 year old moderately obese male with history of aortic stenosis, mitral stenosis, hyperlipidemia, previous thymectomy with radiation therapy to the chest, and longstanding tobacco abuse who has been referred for surgical consultation to discuss treatment options for management of severe symptomatic aortic stenosis, mitral stenosis, and multivessel coronary artery disease.  Patient states that he was first noted to have a heart murmur on physical exam 4 or 5 years ago.  Echocardiograms have documented the presence of aortic stenosis and mitral stenosis with normal left ventricular systolic function.  The patient has been followed for the last several years by Dr. Angelena Form and recent follow-up transthoracic echocardiogram performed Jun 27, 2018 revealed significant progression of disease including severe aortic stenosis and severe mitral stenosis.  Left ventricular systolic function remains normal with ejection fraction estimated greater than 65%.  Peak velocity across the aortic valve measured 4.2 m/s corresponding to mean transvalvular gradient estimated 45 mmHg.  Mean gradient across mitral valve with estimated 13 mmHg corresponding to mitral valve area 1.1 cm using pressure half-time.  Left and right heart catheterization was performed September 15, 2018 and confirmed the presence of severe aortic stenosis with peak to peak and mean transvalvular gradients measured 32 and 34 mmHg, respectively.  There was severe multivessel coronary artery disease with long segment proximal and  mid stenosis of the left anterior descending coronary artery and 100% chronic occlusion of the right coronary artery.  There was no significant flow-limiting disease in the left circumflex territory.  Right heart pressures were moderately elevated.  Cardiothoracic surgical consultation was requested.  Patient is married and lives in [place], [place] with his wife.  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  He does not exercise on a regular basis and he lives a somewhat sedentary lifestyle when he is not at work.  Her, he does not report any significant limitations other than exertional shortness of breath.  He does admit to shortness of breath with more strenuous exertion such as walking a long distance.  He does not get short of breath with low-level activity and he denies any history of resting shortness of breath, PND, orthopnea, or lower extremity edema.  He has never had any change in her chest tightness either with activity or at rest.  Patient is a 68 year old moderately obese male with history of aortic stenosis, mitral stenosis, hyperlipidemia, previous thymectomy with radiation therapy to the chest, and longstanding tobacco abuse who returns to the office today with tentative plans to proceed with aortic valve replacement, mitral valve replacement, and coronary artery bypass grafting later this week.  He was originally seen in consultation on September 25, 2018.  Since then he underwent CT angiography and he is scheduled for routine preoperative lab work and pulmonary function tests today.  He reports no new problems or complaints.  He has been practicing social distancing and wearing a mask whenever he is in public.  He has not been exposed to any persons with known or suspected COVID-19 infection.  He denies any fevers or productive cough.  He denies any shortness of breath.  Past Medical History:  Diagnosis Date   BCE (basal cell epithelioma), arm    RIGHT  SHOULDER   Dyslipidemia    Esophageal stricture    Hyperlipidemia    Incidental pulmonary nodule, > 68m and < 891m8/26/2020   Noted on CTA   Inguinal hernia    bilateral   Obesity    Smoker     Past Surgical History:  Procedure Laterality Date   COLONOSCOPY  03/2008,2004   Dr. HaAmedeo Plenty HERNIA REPAIR  12/11/10   BIH   RIGHT/LEFT HEART CATH AND CORONARY ANGIOGRAPHY N/A 09/15/2018   Procedure: RIGHT/LEFT HEART CATH AND CORONARY ANGIOGRAPHY;  Surgeon: McBurnell BlanksMD;  [facility];  Service: Cardiovascular;  Laterality: N/A;   TEE WITHOUT CARDIOVERSION N/A 12/12/2015   Procedure: TRANSESOPHAGEAL ECHOCARDIOGRAM (TEE);  Surgeon: MiSanda KleinMD;  [facility];  Service: Cardiovascular;  Laterality: N/A;   THYMECTOMY  1976    Family History  Problem Relation Age of Onset   Cancer Mother [id number]     colon   Cancer Father 6631     lung   CAD Neg Hx     Social History Social History   Tobacco Use   Smoking status: Former Smoker    Packs/day: 0.25    Years: 35.00    Pack years: 8.75    Types: Cigarettes    Quit date: 09/04/2018    Years since quitting: 0.0   Smokeless tobacco: Never Used  Substance Use Topics   Alcohol use: Yes    Alcohol/week: 1.0 standard drinks    Types: 1 Cans of beer per week   Drug use: No    Prior to Admission medications   Medication Sig Start Date End Date Taking? Authorizing Provider  aspirin 81 MG tablet Take 81 mg by mouth every other day.    Yes [provider]  ibuprofen (ADVIL) 200 MG tablet Take 400 mg by mouth every 6 (six) hours as needed for moderate pain.   Yes [provider]  nicotine polacrilex (COMMIT) 2 MG lozenge Take 2 mg by mouth as needed for smoking cessation.   Yes [provider]  oxymetazoline (AFRIN) 0.05 % nasal spray Place 1 spray into both nostrils at bedtime as needed for congestion.   Yes [provider]    No Known  Allergies    Review of Systems:              General:                      normal appetite, normal energy, no weight gain, no weight loss, no fever             Cardiac:                       no chest pain with exertion, no chest pain at rest, +SOB with exertion, no resting SOB, no PND, no orthopnea, no palpitations, no arrhythmia, no atrial fibrillation, no LE edema, no dizzy spells, no syncope             Respiratory:                 no shortness of breath, no home oxygen, no  productive cough, no dry cough, no bronchitis, no wheezing, no hemoptysis, no asthma, no pain with inspiration or cough, no sleep apnea, no CPAP at night             GI:                               no difficulty swallowing, no reflux, no frequent heartburn, no hiatal hernia, no abdominal pain, no constipation, no diarrhea, no hematochezia, no hematemesis, no melena             GU:                              no dysuria,  no frequency, no urinary tract infection, no hematuria, no enlarged prostate, no kidney stones, no kidney disease             Vascular:                     no pain suggestive of claudication, no pain in feet, no leg cramps, no varicose veins, no DVT, no non-healing foot ulcer             Neuro:                         no stroke, no TIA's, no seizures, no headaches, no temporary blindness one eye,  no slurred speech, no peripheral neuropathy, no chronic pain, no instability of gait, no memory/cognitive dysfunction             Musculoskeletal:         + arthritis particularly right knee, no joint swelling, no myalgias, no difficulty walking, normal mobility              Skin:                            no rash, no itching, no skin infections, no pressure sores or ulcerations             Psych:                         no anxiety, no depression, no nervousness, no unusual recent stress             Eyes:                           no blurry vision, no floaters, no recent vision changes, + wears glasses or  contacts             ENT:                            no hearing loss, no loose or painful teeth, no dentures, last saw dentist 09/20/2018             Hematologic:               + easy bruising, no abnormal bleeding, no clotting disorder, no frequent epistaxis             Endocrine:                   no diabetes, does not check CBG's at home  Physical Exam:              BP 106/68 (BP Location: Left Arm, Patient Position: Sitting, Cuff Size: Normal)    Pulse 92    Temp 97.7 F (36.5 C)    Resp 18    Ht _0  (1.753 m)    Wt 226 lb (102.5 kg)    SpO2 93% Comment: RA   BMI 33.37 kg/m              General:                      Moderately obese,  well-appearing             HEENT:                       Unremarkable              Neck:                           no JVD, no bruits, no adenopathy              Chest:                          clear to auscultation, symmetrical breath sounds, no wheezes, no rhonchi              CV:                              RRR, grade III/VI harsh crescendo/decrescendo systolic murmur              Abdomen:                    soft, non-tender, no masses              Extremities:                 warm, well-perfused, pulses diminished but palpable, no LE edema             Rectal/GU                   Deferred             Neuro:                         Grossly non-focal and symmetrical throughout             Skin:                            Clean and dry, no rashes, no breakdown   Diagnostic Tests:  ECHOCARDIOGRAM REPORT     Patient Name: [name] Date of Exam: 06/27/2018 Medical Rec #: [id number] Height: 69.0 in Accession #: [id number] Weight: 235.2 lb Date of Birth: 1950-04-12 BSA: 2.21 m Patient Age: 68 years BP: 108/76 mmHg Patient Gender: M HR: 90 bpm. Exam Location: [address]   Procedure: 2D Echo, 3D Echo, Cardiac Doppler and  Color Doppler  Indications: I35.0 Aortic Stenosis I05.0 Mitral Stenosis  History: Patient has prior history of Echocardiogram examinations, most recent 12/27/2017. Aortic Valve Disease and Mitral Valve Disease Risk Factors: Dyslipidemia and Current Smoker. Obesity, Aortic Stenosis (prior mean gradient 34  mmHG), Mitral Stenosis.  Sonographer: Deliah Boston RDCS Referring Phys: [place]   1. The left ventricle has hyperdynamic systolic function, with an ejection fraction of >65%. The cavity size was normal. There is moderately increased left ventricular wall thickness. Left ventricular diastolic Doppler parameters are indeterminate. 2. The right ventricle has normal systolic function. The cavity was normal. 3. Left atrial size was severely dilated. 4. The mitral valve is abnormal. There is severe mitral annular calcification present. Severe mitral valve stenosis. 5. The tricuspid valve is grossly normal. 6. There is mild dilatation of the ascending aorta measuring 38 mm. 7. Vigorous LV systolic function; moderate LVH; mildly dilated ascending aorta; heavily calcified aortic valve with severe AS (mean gradient 45 mmHg) and at least moderate AI (difficult to quantitate due to contamination from mitral inflow); severe MAC  with severe MS (mean gradient 13 mmHg; MVA by pressure halftime 1.1 cm2); severe LAE.  FINDINGS Left Ventricle: The left ventricle has hyperdynamic systolic function, with an ejection fraction of >65%. The cavity size was normal. There is moderately increased left ventricular wall thickness. Left ventricular diastolic Doppler parameters are  indeterminate.  Right Ventricle: The right ventricle has normal systolic function. The cavity was normal. There is mildly increased right ventricular wall thickness.  Left Atrium: Left atrial size was  severely dilated.  Right Atrium: Right atrial size was normal in size. Right atrial pressure is estimated at 3 mmHg.  Interatrial Septum: No atrial level shunt detected by color flow Doppler.  Pericardium: There is no evidence of pericardial effusion.  Mitral Valve: The mitral valve is abnormal. There is severe mitral annular calcification present. Mitral valve regurgitation is trivial by color flow Doppler. Severe mitral valve stenosis.  Tricuspid Valve: The tricuspid valve is grossly normal. Tricuspid valve regurgitation is mild by color flow Doppler.  Aortic Valve: The aortic valve has an indeterminate number of cusps Severe calcifcation of the aortic valve. Aortic valve regurgitation is moderate by color flow Doppler. There is Severe stenosis of the aortic valve  Pulmonic Valve: The pulmonic valve was grossly normal. Pulmonic valve regurgitation is trivial by color flow Doppler.  Aorta: There is mild dilatation of the ascending aorta measuring 38 mm.  Venous: The inferior vena cava is normal in size with greater than 50% respiratory variability.   +--------------+--------++  LEFT VENTRICLE     +----------------+---------++ +--------------+--------++  Diastology       PLAX 2D      +----------------+---------++ +--------------+--------++  LV e' lateral:  5.98 cm/s    LVIDd:  4.50 cm    +----------------+---------++ +--------------+--------++  LV E/e' lateral: 35.8     LVIDs:  2.17 cm    +----------------+---------++ +--------------+--------++  LV e' medial:  6.74 cm/s    LV PW:  1.12 cm    +----------------+---------++ +--------------+--------++  LV E/e' medial:  31.8     LV IVS:  0.80 cm    +----------------+---------++ +--------------+--------++  LVOT diam:  2.20 cm    +--------------+--------++  LV SV:  77 ml    +--------------+--------++  LV SV Index:  33.02     +--------------+--------++  LVOT Area:  3.80 cm   +--------------+--------++        +--------------+--------++  +---------------+----------++  RIGHT VENTRICLE     +---------------+----------++  RV S prime:  14.00 cm/s   +---------------+----------++  TAPSE (M-mode): 2.1 cm    +---------------+----------++  +---------------+--------++-----------++  LEFT ATRIUM     Index    +---------------+--------++-----------++  LA diam:  5.20  cm   2.35 cm/m    +---------------+--------++-----------++  LA Vol (A2C):  115.0 ml  51.96 ml/m   +---------------+--------++-----------++  LA Vol (A4C):  116.0 ml  52.41 ml/m   +---------------+--------++-----------++  LA Biplane Vol: 118.0 ml  53.31 ml/m   +---------------+--------++-----------++ +------------+---------++-----------++  RIGHT ATRIUM    Index    +------------+---------++-----------++  RA Area:  20.30 cm      +------------+---------++-----------++  RA Volume:  55.70 ml   25.17 ml/m   +------------+---------++-----------++ +------------------+------------++  AORTIC VALVE      +------------------+------------++  AV Area (Vmax):  2.25 cm    +------------------+------------++  AV Area (Vmean):  2.15 cm    +------------------+------------++  AV Area (VTI):  2.68 cm    +------------------+------------++  AV Vmax:  419.75 cm/s    +------------------+------------++  AV Vmean:  293.750 cm/s   +------------------+------------++  AV VTI:  0.970 m    +------------------+------------++  AV Peak Grad:  70.5 mmHg    +------------------+------------++  AV Mean Grad:  45.3 mmHg    +------------------+------------++  LVOT Vmax:  248.00 cm/s    +------------------+------------++  LVOT Vmean:  166.000 cm/s   +------------------+------------++  LVOT  VTI:  0.683 m    +------------------+------------++  LVOT/AV VTI ratio: 0.70    +------------------+------------++  AR PHT:  229 msec    +------------------+------------++  +-------------+-------++  AORTA      +-------------+-------++  Ao Root diam: 3.40 cm   +-------------+-------++  Ao Asc diam:  3.80 cm   +-------------+-------++  +--------------+----------++ +---------------+-----------++  MITRAL VALVE       TRICUSPID VALVE     +--------------+----------++ +---------------+-----------++  MV Area (PHT): 1.1 cm     TR Peak grad:  31.4 mmHg    +--------------+----------++ +---------------+-----------++  MV Peak grad:  27.9 mmHg     TR Vmax:  343.00 cm/s   +--------------+----------++ +---------------+-----------++  MV Mean grad:  13.0 mmHg    +--------------+----------++ +--------------+-------+  MV Vmax:  2.64 m/s     SHUNTS     +--------------+----------++ +--------------+-------+  MV Vmean:  166.0 cm/s    Systemic VTI:  0.68 m   +--------------+----------++ +--------------+-------+  MV VTI:  0.77 m     Systemic Diam: 2.20 cm  +--------------+----------++ +--------------+-------+  MV PHT:  202 msec    +--------------+----------++  MV Decel Time: 398 msec    +--------------+----------++ +---------------+-----------++  MR Peak grad:  143.0 mmHg    +---------------+-----------++  MR Mean grad:  87.0 mmHg    +---------------+-----------++  MR Vmax:  598.00 cm/s   +---------------+-----------++  MR Vmean:  434.0 cm/s    +---------------+-----------++  MR PISA:  1.01 cm    +---------------+-----------++  MR PISA Radius: 0.40 cm    +---------------+-----------++ +--------------+-----------++  MV E velocity: 214.00 cm/s   +--------------+-----------++  MV A velocity: 235.00  cm/s   +--------------+-----------++  MV E/A ratio:  0.91    +--------------+-----------++   Kirk Ruths MD Electronically signed by Kirk Ruths MD Signature Date/Time: 06/27/2018/1:13:43 PM    RIGHT/LEFT HEART CATH AND CORONARY ANGIOGRAPHY  Conclusion    Prox RCA lesion is 100% stenosed.  Prox Cx to Mid Cx lesion is 30% stenosed.  1st Mrg lesion is 30% stenosed.  3rd Mrg lesion is 30% stenosed.  Prox LAD lesion is 40% stenosed.  Mid LAD lesion is 95% stenosed.  1. Severe stenosis mid LAD 2. Mild non-obstructive disease in the LAD 3. Chronic occlusion of the proximal RCA. Filling of the proximal, mid and distal RCA from left to right collaterals 4.  Severe aortic stenosis (mean gradient 34.1 mmHg, peak to peak gradient 32 mmHg, AVA 1.1 cm2) 5. Severe mitral stenosis by echo.   Recommendations: Will refer to CT surgery for CABG, AVR and MVR. Will discuss arranging gated cardiac CT prior to surgery.    Recommendations  Antiplatelet/Anticoag CT surgery consultation for AVR, MVR and CABG.  Indications  Severe aortic stenosis [I35.0 (ICD-10-CM)]  Severe mitral valve stenosis [I05.0 (ICD-10-CM)]  Coronary artery disease involving native coronary artery of native heart without angina pectoris [I25.10 (ICD-10-CM)]  Procedural Details  Technical Details Indication: 68 yo male with severe AS, severe MS, normal LV function.   Procedure: The risks, benefits, complications, treatment options, and expected outcomes were discussed with the patient. The patient and/or family concurred with the proposed plan, giving informed consent. The patient was brought to the cath lab after IV hydration was given. The patient was sedated with Versed and Fentanyl. The IV catheter in the right antecubital vein was prepped, draped and changed for a 5 French sheath. Right heart cath performed with a balloon tipped catheter. The right wrist was prepped and draped in a sterile  fashion. 1% lidocaine was used for local anesthesia. Using the modified Seldinger access technique, a 5 French sheath was placed in the right radial artery. 3 mg Verapamil was given through the sheath. 5000 units IV heparin was given. Standard diagnostic catheters were used to perform selective coronary angiography. I crossed the aortic valve with an AL-1 catheter and the J wire. The sheath was removed from the right radial artery and a Terumo hemostasis band was applied at the arteriotomy site on the right wrist.    Estimated blood loss <50 mL.   During this procedure medications were administered to achieve and maintain moderate conscious sedation while the patient's heart rate, blood pressure, and oxygen saturation were continuously monitored and I was present face-to-face 100% of this time.  Medications (Filter: Administrations occurring from 09/15/18 0853 to 09/15/18 0941)          (important)  Continuous medications are totaled by the amount administered until 09/15/18 0941.  Medication Rate/Dose/Volume Action  Date Time    Heparin (Porcine) in NaCl 1000-0.9 UT/500ML-% SOLN (mL) 500 mL Given 09/15/18 0859    Total dose as of 09/15/18 0941 500 mL Given 0859    1,000 mL         fentaNYL (SUBLIMAZE) injection (mcg) 50 mcg Given 09/15/18 0904    Total dose as of 09/15/18 0941         50 mcg         midazolam (VERSED) injection (mg) 2 mg Given 09/15/18 0904    Total dose as of 09/15/18 0941         2 mg         lidocaine (PF) (XYLOCAINE) 1 % injection (mL) 2 mL Given 09/15/18 0912    Total dose as of 09/15/18 0941 2 mL Given 0917    4 mL         Radial Cocktail/Verapamil only (mL) 10 mL Given 09/15/18 0918    Total dose as of 09/15/18 0941         10 mL         heparin injection (Units) 5,000 Units Given 09/15/18 0920    Total dose as of 09/15/18 0941         5,000 Units         iohexol (OMNIPAQUE) 350 MG/ML injection  (mL) 40 mL Given 09/15/18  0935    Total dose as of 09/15/18 0941         40 mL         Sedation Time  Sedation Time Physician-1: 33 minutes 52 seconds  Complications  Complications documented before study signed (09/15/2018 9:46 AM)   RIGHT/LEFT HEART CATH AND CORONARY ANGIOGRAPHY  None Documented by Burnell Blanks, MD 09/15/2018 9:41 AM  Date Found: 09/15/2018  Time Range: Intraprocedure      Coronary Findings  Diagnostic Dominance: Right Left Anterior Descending  Vessel is large.  Prox LAD lesion 40% stenosed  Prox LAD lesion is 40% stenosed.  Mid LAD lesion 95% stenosed  Mid LAD lesion is 95% stenosed.  Left Circumflex  Vessel is large.  Prox Cx to Mid Cx lesion 30% stenosed  Prox Cx to Mid Cx lesion is 30% stenosed.  First Obtuse Marginal Branch  Vessel is large in size.  1st Mrg lesion 30% stenosed  1st Mrg lesion is 30% stenosed.  Third Obtuse Marginal Branch  Vessel is moderate in size.  3rd Mrg lesion 30% stenosed  3rd Mrg lesion is 30% stenosed.  Right Coronary Artery  Vessel is large.  Prox RCA lesion 100% stenosed  Prox RCA lesion is 100% stenosed. The lesion is chronically occluded.  Third Right Posterolateral Branch  Collaterals  3rd RPL filled by collaterals from 2nd Sept.    Intervention  No interventions have been documented. Coronary Diagrams  Diagnostic Dominance: Right  Intervention  Implants      No implant documentation for this case.  Syngo Images  Show images for CARDIAC CATHETERIZATION  Images on Long Term Storage  Show images for Shrihan, Putt to Procedure Log  Procedure Log    Hemo Data   Most Recent Value  Fick Cardiac Output 7.29 L/min  Fick Cardiac Output Index 3.35 (L/min)/BSA  Aortic Mean Gradient 34.14 mmHg  Aortic Peak Gradient 32 mmHg  Aortic Valve Area 1.16  Aortic Value Area Index 0.53 cm2/BSA  RA A Wave 9 mmHg  RA V Wave 7 mmHg  RA Mean 6 mmHg  RV  Systolic Pressure 46 mmHg  RV Diastolic Pressure 1 mmHg  RV EDP 7 mmHg  PA Systolic Pressure 46 mmHg  PA Diastolic Pressure 20 mmHg  PA Mean 30 mmHg  PW A Wave 25 mmHg  PW V Wave 33 mmHg  PW Mean 22 mmHg  AO Systolic Pressure 706 mmHg  AO Diastolic Pressure 58 mmHg  AO Mean 81 mmHg  LV Systolic Pressure 237 mmHg  LV Diastolic Pressure 3 mmHg  LV EDP 7 mmHg  AOp Systolic Pressure 628 mmHg  AOp Diastolic Pressure 57 mmHg  AOp Mean Pressure 84 mmHg  LVp Systolic Pressure 315 mmHg  LVp Diastolic Pressure 2 mmHg  LVp EDP Pressure 6 mmHg  QP/QS 1  TPVR Index 8.95 HRUI  TSVR Index 24.18 HRUI  PVR SVR Ratio 0.11  TPVR/TSVR Ratio 0.37    Cardiac TAVR CT  TECHNIQUE: The patient was scanned on a [employer]. A 120 kV retrospective scan was triggered in the descending thoracic aorta at 111 HU's. Gantry rotation speed was 250 msecs and collimation was .6 mm. No beta blockade or nitro were given. The 3D data set was reconstructed in 5% intervals of the R-R cycle. Systolic and diastolic phases were analyzed on a dedicated work station using MPR, MIP and VRT modes. The patient received 80 cc of contrast.  FINDINGS: Aortic Valve: Aortic valve is  trileaflet with severely thickened and calcified leaflets with severe diffuse calcifications extending into the LVOT and continuing into severe circumferential mitral annular calcifications.  Aorta: Normal size with mild diffuse atherosclerotic plaque and calcifications, no dissection.  Sinotubular Junction: 29 x 28 mm  Ascending Thoracic Aorta: 38 x 37 mm  Aortic Arch: 35 x 34 mm  Descending Thoracic Aorta: 25 x 25 mm  Sinus of Valsalva Measurements:  Non-coronary: 33 mm  Right -coronary: 29 mm  Left -coronary: 32 mm  Coronary Artery Height above Annulus:  Left Main: 16 mm  Right Coronary: 19 mm  Virtual Basal Annulus Measurements:  Maximum/Minimum Diameter: 31.7 x 25.5 mm  Mean Diameter:  28.4 mm  Perimeter: 92.3 mm  Area: 632 mm2  IMPRESSION: 1. Aortic valve is trileaflet with severely thickened and calcified leaflets with severe diffuse calcifications extending into the LVOT and continuing into severe circumferential mitral annular calcifications.  2. Thoracic aorta has normal size with mild diffuse atherosclerotic disease and calcifications.  3. No thrombus in the left atrial appendage.  Electronically Signed: By: Ena Dawley On: 10/04/2018 16:13   CT ANGIOGRAPHY CHEST, ABDOMEN AND PELVIS  TECHNIQUE: Multidetector CT imaging through the chest, abdomen and pelvis was performed using the standard protocol during bolus administration of intravenous contrast. Multiplanar reconstructed images and MIPs were obtained and reviewed to evaluate the vascular anatomy.  CONTRAST: 147m OMNIPAQUE IOHEXOL 350 MG/ML SOLN  COMPARISON: None.  FINDINGS: CTA CHEST FINDINGS  Cardiovascular: Heart size is mildly enlarged with left atrial dilatation. There is no significant pericardial fluid, thickening or pericardial calcification. There is aortic atherosclerosis, as well as atherosclerosis of the great vessels of the mediastinum and the coronary arteries, including calcified atherosclerotic plaque in the left main, left anterior descending, left circumflex and right coronary arteries. Severe thickening calcification of the aortic valve. Severe thickening calcification of the mitral valve and annulus. Ectasia of ascending thoracic aorta (4.0 cm in diameter).  Mediastinum/Lymph Nodes: No pathologically enlarged mediastinal or hilar lymph nodes. Surgical clips in the anterior mediastinum. Moderate sized hiatal hernia. No axillary lymphadenopathy.  Lungs/Pleura: 8 x 6 mm (mean diameter of 7 mm) right upper lobe pulmonary nodule near the apex (axial image 17 of series 8). 4 mm pulmonary nodule in the right lower lobe (axial image 64 of series 8).  No acute consolidative airspace disease. No pleural effusions.  Musculoskeletal/Soft Tissues: Multiple chronic appearing compression fractures, most severe at T10 and T11 where there is approximately 40% loss of anterior vertebral body height. There are no aggressive appearing lytic or blastic lesions noted in the visualized portions of the skeleton.  CTA ABDOMEN AND PELVIS FINDINGS  Hepatobiliary: No suspicious cystic or solid hepatic lesions. No intra or extrahepatic biliary ductal dilatation. Gallbladder is normal in appearance.  Pancreas: No pancreatic mass. No pancreatic ductal dilatation. No pancreatic or peripancreatic fluid collections or inflammatory changes.  Spleen: Unremarkable.  Adrenals/Urinary Tract: Bilateral kidneys and adrenal glands are normal in appearance. No hydroureteronephrosis. Urinary bladder is normal in appearance.  Stomach/Bowel: Normal appearance of the intra-abdominal portion of the stomach. No pathologic dilatation of small bowel or colon. Normal appendix.  Vascular/Lymphatic: Mild aortic atherosclerosis, without evidence of aneurysm or dissection in the abdominal or pelvic vasculature. Vascular findings and measurements pertinent to potential TAVR procedure, as detailed below. No lymphadenopathy noted in the abdomen or pelvis.  Reproductive: Prostate gland and seminal vesicles are unremarkable in appearance.  Other: No significant volume of ascites. No pneumoperitoneum.  Musculoskeletal: Chronic appearing compression fracture of L1  with 25% loss of anterior vertebral body height. There are no aggressive appearing lytic or blastic lesions noted in the visualized portions of the skeleton.  VASCULAR MEASUREMENTS PERTINENT TO TAVR:  AORTA:  Minimal Aortic Diameter-1.5 x 1.6 mm  Severity of Aortic Calcification-mild  RIGHT PELVIS:  Right Common Iliac Artery -  Minimal Diameter-9.8 x 9.7  mm  Tortuosity-mild  Calcification-none  Right External Iliac Artery -  Minimal Diameter-9.3 x 8.6 mm  Tortuosity-moderate to severe  Calcification - none  Right Common Femoral Artery -  Minimal Diameter-9.5 x 9.7 mm  Tortuosity-mild  Calcification - none  LEFT PELVIS:  Left Common Iliac Artery -  Minimal Diameter-10.2 x 9.9 mm  Tortuosity-mild  Calcification - none  Left External Iliac Artery -  Minimal Diameter-10.1 x 9.1 mm  Tortuosity-moderate to severe  Calcification - none  Left Common Femoral Artery -  Minimal Diameter-8.8 x 8.8 mm  Tortuosity-mild  Calcification - none  Review of the MIP images confirms the above findings.  IMPRESSION: 1. Vascular findings and measurements pertinent to potential TAVR procedure, as detailed above. 2. Severe thickening calcification of the aortic valve, compatible with reported clinical history of severe aortic stenosis. 3. There is also severe thickening calcification of the mitral valve and annulus. 4. Aortic atherosclerosis, in addition to left main and 3 vessel coronary artery disease. There is also ectasia of the ascending thoracic aorta (4.0 cm in diameter). Recommend annual imaging followup by CTA or MRA. This recommendation follows 2010 ACCF/AHA/AATS/ACR/ASA/SCA/SCAI/SIR/STS/SVM Guidelines for the Diagnosis and Management of Patients with Thoracic Aortic Disease. Circulation. 2010; 121: H631-S970. Aortic aneurysm NOS (ICD10-I71.9). 5. Cardiomegaly with left atrial dilatation. 6. Small pulmonary nodules measuring 7 mm or less in size, as above. Non-contrast chest CT at 3-6 months is recommended. If the nodules are stable at time of repeat CT, then future CT at 18-24 months (from today's scan) is considered optional for low-risk patients, but is recommended for high-risk patients. This recommendation follows the consensus statement: Guidelines for Management of Incidental  Pulmonary Nodules Detected on CT Images: From the Fleischner Society 2017; Radiology 2017; 284:228-243. 7. Additional incidental findings, as above.   Electronically Signed By: Vinnie Langton M.D. On: 10/04/2018 16:49     Impression:  Patient has stage D severe symptomatic aortic stenosis and mitral stenosis with severe multivessel coronary artery disease. He describes stable symptoms of exertional shortness of breath and fatigue that occur only with more strenuous physical exertion consistent with chronic diastolic congestive heart failure, New [place] Heart Association functional class II. I have personally reviewed the patient's recent transthoracic echocardiogram, diagnostic cardiac catheterization, and CT angiograms.Echocardiogram reveals normal left ventricular systolic function with at least mild left ventricular hypertrophy. There is severe aortic stenosis and probably severe mitral stenosis. The aortic valve is trileaflet. There is severe thickening and restricted leaflet mobility involving all 3 leaflets. There is significant calcification. Peak velocity across aortic valve measured 4.2 m/s corresponding to mean transvalvular gradient estimated well above 40 mmHg. The mitral valve is also severely thickened with restricted leaflet mobility involving both leaflets throughout the cardiac cycle. It is unclear whether or not there is significant thickening or foreshortening of the subvalvular apparatus. There appears to be mild mitral regurgitation and at least mild if not moderate aortic insufficiency. Is possible that the patient's underlying valvular heart disease could be rheumatic or it could be related to his previous radiation therapy. Diagnostic cardiac catheterization revealed severe multivessel coronary artery disease with 95% stenosis of mid left anterior descending  coronary artery and 100% chronic occlusion of the right coronary artery. There is codominant  coronary circulation and is unclear whether or not the patient will have suitable target vessel for grafting of the distal right coronary artery territory. The patient had mild to moderate pulmonary hypertension. I agree the patient would benefit from aortic valve replacement, mitral valve replacement, and coronary artery bypass grafting.  CT angiography reveals extensive calcification in the aortic root extending into the left ventricular outflow tract into the mitral annulus.  The aortic root appears adequate sized.Risks of surgery will be slightly elevated because of the patient's history of previous thymectomy and radiation therapy to the chest.  The patient will need late follow-up CT imaging in 3 to 6 months because of small benign-appearing lung nodules noted on CT.    Plan:  The patientand his wife were againcounseled at length regarding treatment alternatives for management of severe aortic stenosis, mitral stenosis and coronary artery diseaseincluding continued medical therapy versus proceeding withsurgical interventionin the near future. The natural history of aortic and mitralstenosis werereviewed, as was long term prognosis with medical therapy alone. Surgical options were discussed at length including conventional surgical aortic and mitralvalve replacement with coronary artery bypass grafting.Discussion was held comparing the relative risks of mechanical valve replacement with need for lifelong anticoagulation versus use of a bioprosthetic tissue valve and the associated potential for late structural valve deterioration and failure. This discussion was placed in the context of the patient's particular circumstances, and as a result the patient specifically requests that their valve be replaced using bioprosthetic tissue valves.Expectations for the patient's postoperative convalescence has been discussed.  They understand and accept all potential risks of surgery including  but not limited to risk of death, stroke or other neurologic complication, myocardial infarction, congestive heart failure, respiratory failure, renal failure, bleeding requiring transfusion and/or reexploration, arrhythmia, infection or other wound complications, pneumonia, pleural and/or pericardial effusion, pulmonary embolus, aortic dissection or other major vascular complication, or delayed complications related to valve repair or replacement including but not limited to structural valve deterioration and failure, thrombosis, embolization, endocarditis, or paravalvular leak.  All of their questions have been answered.    Valentina Gu. Roxy Manns, MD 10/09/2018 11:33 AM

## 2018-10-09 NOTE — Progress Notes (Signed)
[place] [address] [facility] [address], [place] [address] Phone: [phone] Fax: [phone]      Your procedure is scheduled on September 2nd.  Report to [facility] Main Entrance "A" at 6:30 A.M., and check in at the Admitting office.  Call this number if you have problems the morning of surgery:  [phone]  Call [phone] if you have any questions prior to your surgery date Monday-Friday 8am-4pm    Remember:  Do not eat or drink after midnight the night before your surgery    Take these medicines the morning of surgery with A SIP OF WATER - NONE  7 days prior to surgery STOP taking any Aspirin (unless otherwise instructed by your surgeon), Aleve, Naproxen, Ibuprofen, Motrin, Advil, Goody's, BC's, all herbal medications, fish oil, and all vitamins.    The Morning of Surgery  Do not wear jewelry  Do not wear lotions, powders, colognes, or deodorant  Men may shave face and neck.  Do not bring valuables to the hospital.  [facility] is not responsible for any belongings or valuables.  If you are a smoker, DO NOT Smoke 24 hours prior to surgery IF you wear a CPAP at night please bring your mask, tubing, and machine the morning of surgery   Remember that you must have someone to transport you home after your surgery, and remain with you for 24 hours if you are discharged the same day.   Contacts, glasses, hearing aids, dentures or bridgework may not be worn into surgery.    Leave your suitcase in the car.  After surgery it may be brought to your room.  For patients admitted to the hospital, discharge time will be determined by your treatment team.  Patients discharged the day of surgery will not be allowed to drive home.    Special instructions:   [facility]- Preparing For Surgery  Before surgery, you can play an important role. Because skin is not sterile, your skin needs to be as free of germs as possible. You  can reduce the number of germs on your skin by washing with CHG (chlorahexidine gluconate) Soap before surgery.  CHG is an antiseptic cleaner which kills germs and bonds with the skin to continue killing germs even after washing.    Oral Hygiene is also important to reduce your risk of infection.  Remember - BRUSH YOUR TEETH THE MORNING OF SURGERY WITH YOUR REGULAR TOOTHPASTE  Please do not use if you have an allergy to CHG or antibacterial soaps. If your skin becomes reddened/irritated stop using the CHG.  Do not shave (including legs and underarms) for at least 48 hours prior to first CHG shower. It is OK to shave your face.  Please follow these instructions carefully.   1. Shower the NIGHT BEFORE SURGERY and the MORNING OF SURGERY with CHG Soap.   2. If you chose to wash your hair, wash your hair first as usual with your normal shampoo.  3. After you shampoo, rinse your hair and body thoroughly to remove the shampoo.  4. Use CHG as you would any other liquid soap. You can apply CHG directly to the skin and wash gently with a scrungie or a clean washcloth.   5. Apply the CHG Soap to your body ONLY FROM THE NECK DOWN.  Do not use on open wounds or open sores. Avoid contact with your eyes, ears, mouth and genitals (private parts). Wash Face and genitals (  private parts)  with your normal soap.   6. Wash thoroughly, paying special attention to the area where your surgery will be performed.  7. Thoroughly rinse your body with warm water from the neck down.  8. DO NOT shower/wash with your normal soap after using and rinsing off the CHG Soap.  9. Pat yourself dry with a CLEAN TOWEL.  10. Wear CLEAN PAJAMAS to bed the night before surgery, wear comfortable clothes the morning of surgery  11. Place CLEAN SHEETS on your bed the night of your first shower and DO NOT SLEEP WITH PETS.    Day of Surgery:  Do not apply any deodorants/lotions. Please shower the morning of surgery with the CHG  soap  Please wear clean clothes to the hospital/surgery center.   Remember to brush your teeth WITH YOUR REGULAR TOOTHPASTE.   Please read over the following fact sheets that you were given.

## 2018-10-10 MED ORDER — SODIUM CHLORIDE 0.9 % IV SOLN
INTRAVENOUS | Status: DC
  Filled 2018-10-10: qty 30

## 2018-10-10 MED ORDER — EPINEPHRINE HCL 5 MG/250ML IV SOLN IN NS
0.0000 ug/min | INTRAVENOUS | Status: DC
  Filled 2018-10-10: qty 250

## 2018-10-10 MED ORDER — DOPAMINE-DEXTROSE 3.2-5 MG/ML-% IV SOLN
0.0000 ug/kg/min | INTRAVENOUS | Status: DC
  Filled 2018-10-10: qty 250

## 2018-10-10 MED ORDER — MANNITOL 20 % IV SOLN
Freq: Once | INTRAVENOUS | Status: DC
  Filled 2018-10-10: qty 13

## 2018-10-10 MED ORDER — TRANEXAMIC ACID 1000 MG/10ML IV SOLN
1.5000 mg/kg/h | INTRAVENOUS | Status: AC
  Administered 2018-10-11: 1.5 mg/kg/h via INTRAVENOUS
  Filled 2018-10-10: qty 25

## 2018-10-10 MED ORDER — TRANEXAMIC ACID (OHS) BOLUS VIA INFUSION
15.0000 mg/kg | INTRAVENOUS | Status: AC
  Administered 2018-10-11: 1588.5 mg via INTRAVENOUS
  Filled 2018-10-10: qty 1589

## 2018-10-10 MED ORDER — VANCOMYCIN HCL 1000 MG IV SOLR
INTRAVENOUS | Status: AC
  Administered 2018-10-11: 1000 mL
  Filled 2018-10-10: qty 1000

## 2018-10-10 MED ORDER — NOREPINEPHRINE 4 MG/250ML-% IV SOLN
0.0000 ug/min | INTRAVENOUS | Status: DC
  Filled 2018-10-10: qty 250

## 2018-10-10 MED ORDER — PHENYLEPHRINE HCL-NACL 20-0.9 MG/250ML-% IV SOLN
30.0000 ug/min | INTRAVENOUS | Status: DC
  Filled 2018-10-10: qty 250

## 2018-10-10 MED ORDER — SODIUM CHLORIDE 0.9 % IV SOLN
1.5000 g | INTRAVENOUS | Status: AC
  Administered 2018-10-11: 1.5 g via INTRAVENOUS
  Filled 2018-10-10: qty 1.5

## 2018-10-10 MED ORDER — PLASMA-LYTE 148 IV SOLN
INTRAVENOUS | Status: AC
  Administered 2018-10-11: 500 mL
  Filled 2018-10-10: qty 2.5

## 2018-10-10 MED ORDER — POTASSIUM CHLORIDE 2 MEQ/ML IV SOLN
80.0000 meq | INTRAVENOUS | Status: DC
  Filled 2018-10-10: qty 40

## 2018-10-10 MED ORDER — TRANEXAMIC ACID (OHS) PUMP PRIME SOLUTION
2.0000 mg/kg | INTRAVENOUS | Status: DC
  Filled 2018-10-10: qty 2.12

## 2018-10-10 MED ORDER — INSULIN REGULAR(HUMAN) IN NACL 100-0.9 UT/100ML-% IV SOLN
INTRAVENOUS | Status: DC
  Filled 2018-10-10: qty 100

## 2018-10-10 MED ORDER — MILRINONE LACTATE IN DEXTROSE 20-5 MG/100ML-% IV SOLN
0.3000 ug/kg/min | INTRAVENOUS | Status: AC
  Administered 2018-10-11: 0.375 ug/kg/min via INTRAVENOUS
  Filled 2018-10-10: qty 100

## 2018-10-10 MED ORDER — SODIUM CHLORIDE 0.9 % IV SOLN
750.0000 mg | INTRAVENOUS | Status: DC
  Filled 2018-10-10: qty 750

## 2018-10-10 MED ORDER — DEXMEDETOMIDINE HCL IN NACL 400 MCG/100ML IV SOLN
0.1000 ug/kg/h | INTRAVENOUS | Status: AC
  Administered 2018-10-11: 10:00:00 .5 ug/kg/h via INTRAVENOUS
  Filled 2018-10-10: qty 100

## 2018-10-10 MED ORDER — NITROGLYCERIN IN D5W 200-5 MCG/ML-% IV SOLN
2.0000 ug/min | INTRAVENOUS | Status: AC
  Administered 2018-10-11: 10:00:00 10 ug/min via INTRAVENOUS
  Filled 2018-10-10: qty 250

## 2018-10-10 MED ORDER — VANCOMYCIN HCL 10 G IV SOLR
1500.0000 mg | INTRAVENOUS | Status: AC
  Administered 2018-10-11: 08:00:00 1500 mg via INTRAVENOUS
  Filled 2018-10-10: qty 1500

## 2018-10-11 ENCOUNTER — Inpatient Hospital Stay (HOSPITAL_COMMUNITY): Payer: Medicare HMO

## 2018-10-11 ENCOUNTER — Encounter (HOSPITAL_COMMUNITY): Payer: Self-pay

## 2018-10-11 ENCOUNTER — Inpatient Hospital Stay (HOSPITAL_COMMUNITY): Payer: Medicare HMO | Admitting: Certified Registered Nurse Anesthetist

## 2018-10-11 ENCOUNTER — Other Ambulatory Visit: Payer: Self-pay

## 2018-10-11 ENCOUNTER — Inpatient Hospital Stay (HOSPITAL_COMMUNITY)
Admission: RE | Admit: 2018-10-11 | Discharge: 2018-10-20 | DRG: 219 | Disposition: A | Discharge status: Home Health | Payer: Medicare HMO | Attending: Thoracic Surgery (Cardiothoracic Vascular Surgery) | Admitting: Thoracic Surgery (Cardiothoracic Vascular Surgery)

## 2018-10-11 ENCOUNTER — Encounter (HOSPITAL_COMMUNITY)
Admission: RE | Disposition: A | Discharge status: Home Health | Payer: Self-pay | Source: Home / Self Care | Attending: Thoracic Surgery (Cardiothoracic Vascular Surgery)

## 2018-10-11 DIAGNOSIS — Y842 Radiological procedure and radiotherapy as the cause of abnormal reaction of the patient, or of later complication, without mention of misadventure at the time of the procedure: Secondary | ICD-10-CM | POA: Diagnosis present

## 2018-10-11 DIAGNOSIS — I272 Pulmonary hypertension, unspecified: Secondary | ICD-10-CM | POA: Diagnosis present

## 2018-10-11 DIAGNOSIS — Z85828 Personal history of other malignant neoplasm of skin: Secondary | ICD-10-CM | POA: Diagnosis not present

## 2018-10-11 DIAGNOSIS — Z23 Encounter for immunization: Secondary | ICD-10-CM

## 2018-10-11 DIAGNOSIS — J9859 Other diseases of mediastinum, not elsewhere classified: Secondary | ICD-10-CM | POA: Diagnosis present

## 2018-10-11 DIAGNOSIS — I700 Atherosclerosis of aorta: Secondary | ICD-10-CM | POA: Diagnosis present

## 2018-10-11 DIAGNOSIS — Z800 Family history of malignant neoplasm of digestive organs: Secondary | ICD-10-CM

## 2018-10-11 DIAGNOSIS — Z87891 Personal history of nicotine dependence: Secondary | ICD-10-CM | POA: Diagnosis not present

## 2018-10-11 DIAGNOSIS — I2582 Chronic total occlusion of coronary artery: Secondary | ICD-10-CM | POA: Diagnosis present

## 2018-10-11 DIAGNOSIS — I083 Combined rheumatic disorders of mitral, aortic and tricuspid valves: Principal | ICD-10-CM | POA: Diagnosis present

## 2018-10-11 DIAGNOSIS — Z7982 Long term (current) use of aspirin: Secondary | ICD-10-CM

## 2018-10-11 DIAGNOSIS — R001 Bradycardia, unspecified: Secondary | ICD-10-CM | POA: Diagnosis not present

## 2018-10-11 DIAGNOSIS — Z20828 Contact with and (suspected) exposure to other viral communicable diseases: Secondary | ICD-10-CM | POA: Diagnosis present

## 2018-10-11 DIAGNOSIS — I442 Atrioventricular block, complete: Secondary | ICD-10-CM | POA: Diagnosis not present

## 2018-10-11 DIAGNOSIS — Z6836 Body mass index (BMI) 36.0-36.9, adult: Secondary | ICD-10-CM

## 2018-10-11 DIAGNOSIS — Z953 Presence of xenogenic heart valve: Secondary | ICD-10-CM

## 2018-10-11 DIAGNOSIS — D6959 Other secondary thrombocytopenia: Secondary | ICD-10-CM | POA: Diagnosis not present

## 2018-10-11 DIAGNOSIS — I340 Nonrheumatic mitral (valve) insufficiency: Secondary | ICD-10-CM | POA: Diagnosis not present

## 2018-10-11 DIAGNOSIS — Z801 Family history of malignant neoplasm of trachea, bronchus and lung: Secondary | ICD-10-CM | POA: Diagnosis not present

## 2018-10-11 DIAGNOSIS — I2510 Atherosclerotic heart disease of native coronary artery without angina pectoris: Secondary | ICD-10-CM | POA: Diagnosis present

## 2018-10-11 DIAGNOSIS — Z8774 Personal history of (corrected) congenital malformations of heart and circulatory system: Secondary | ICD-10-CM

## 2018-10-11 DIAGNOSIS — Z952 Presence of prosthetic heart valve: Secondary | ICD-10-CM

## 2018-10-11 DIAGNOSIS — E785 Hyperlipidemia, unspecified: Secondary | ICD-10-CM | POA: Diagnosis present

## 2018-10-11 DIAGNOSIS — E669 Obesity, unspecified: Secondary | ICD-10-CM | POA: Diagnosis present

## 2018-10-11 DIAGNOSIS — J9811 Atelectasis: Secondary | ICD-10-CM

## 2018-10-11 DIAGNOSIS — I483 Typical atrial flutter: Secondary | ICD-10-CM | POA: Diagnosis not present

## 2018-10-11 DIAGNOSIS — D72829 Elevated white blood cell count, unspecified: Secondary | ICD-10-CM | POA: Diagnosis not present

## 2018-10-11 DIAGNOSIS — I110 Hypertensive heart disease with heart failure: Secondary | ICD-10-CM | POA: Diagnosis present

## 2018-10-11 DIAGNOSIS — I342 Nonrheumatic mitral (valve) stenosis: Secondary | ICD-10-CM | POA: Diagnosis not present

## 2018-10-11 DIAGNOSIS — Q211 Atrial septal defect: Secondary | ICD-10-CM | POA: Diagnosis not present

## 2018-10-11 DIAGNOSIS — I350 Nonrheumatic aortic (valve) stenosis: Secondary | ICD-10-CM

## 2018-10-11 DIAGNOSIS — Z9889 Other specified postprocedural states: Secondary | ICD-10-CM

## 2018-10-11 DIAGNOSIS — I5033 Acute on chronic diastolic (congestive) heart failure: Secondary | ICD-10-CM | POA: Diagnosis not present

## 2018-10-11 DIAGNOSIS — D62 Acute posthemorrhagic anemia: Secondary | ICD-10-CM | POA: Diagnosis not present

## 2018-10-11 DIAGNOSIS — I358 Other nonrheumatic aortic valve disorders: Secondary | ICD-10-CM | POA: Diagnosis not present

## 2018-10-11 DIAGNOSIS — I441 Atrioventricular block, second degree: Secondary | ICD-10-CM | POA: Diagnosis not present

## 2018-10-11 DIAGNOSIS — Z951 Presence of aortocoronary bypass graft: Secondary | ICD-10-CM

## 2018-10-11 DIAGNOSIS — Z923 Personal history of irradiation: Secondary | ICD-10-CM

## 2018-10-11 DIAGNOSIS — I050 Rheumatic mitral stenosis: Secondary | ICD-10-CM | POA: Diagnosis present

## 2018-10-11 DIAGNOSIS — Z959 Presence of cardiac and vascular implant and graft, unspecified: Secondary | ICD-10-CM

## 2018-10-11 HISTORY — PX: TEE WITHOUT CARDIOVERSION: SHX5443

## 2018-10-11 HISTORY — PX: CLIPPING OF ATRIAL APPENDAGE: SHX5773

## 2018-10-11 HISTORY — PX: AORTIC VALVE REPLACEMENT: SHX41

## 2018-10-11 HISTORY — PX: MITRAL VALVE REPLACEMENT: SHX147

## 2018-10-11 HISTORY — PX: CORONARY ARTERY BYPASS GRAFT: SHX141

## 2018-10-11 HISTORY — PX: REPAIR OF PATENT FORAMEN OVALE: SHX6064

## 2018-10-11 HISTORY — DX: Presence of xenogenic heart valve: Z95.3

## 2018-10-11 HISTORY — DX: Presence of aortocoronary bypass graft: Z95.1

## 2018-10-11 HISTORY — DX: Personal history of (corrected) congenital malformations of heart and circulatory system: Z87.74

## 2018-10-11 LAB — CBC
HCT: 22.5 % — ABNORMAL LOW (ref 39.0–52.0)
HCT: 25.2 % — ABNORMAL LOW (ref 39.0–52.0)
Hemoglobin: 7.3 g/dL — ABNORMAL LOW (ref 13.0–17.0)
Hemoglobin: 8 g/dL — ABNORMAL LOW (ref 13.0–17.0)
MCH: 28.7 pg (ref 26.0–34.0)
MCH: 29.1 pg (ref 26.0–34.0)
MCHC: 31.7 g/dL (ref 30.0–36.0)
MCHC: 32.4 g/dL (ref 30.0–36.0)
MCV: 89.6 fL (ref 80.0–100.0)
MCV: 90.3 fL (ref 80.0–100.0)
Platelets: 178 10*3/uL (ref 150–400)
Platelets: 185 10*3/uL (ref 150–400)
RBC: 2.51 MIL/uL — ABNORMAL LOW (ref 4.22–5.81)
RBC: 2.79 MIL/uL — ABNORMAL LOW (ref 4.22–5.81)
RDW: 15.1 % (ref 11.5–15.5)
RDW: 15.4 % (ref 11.5–15.5)
WBC: 13.3 10*3/uL — ABNORMAL HIGH (ref 4.0–10.5)
WBC: 18.6 10*3/uL — ABNORMAL HIGH (ref 4.0–10.5)
nRBC: 0 % (ref 0.0–0.2)
nRBC: 0 % (ref 0.0–0.2)

## 2018-10-11 LAB — HEMOGLOBIN AND HEMATOCRIT, BLOOD
HCT: 27.2 % — ABNORMAL LOW (ref 39.0–52.0)
Hemoglobin: 8.9 g/dL — ABNORMAL LOW (ref 13.0–17.0)

## 2018-10-11 LAB — PREPARE RBC (CROSSMATCH)

## 2018-10-11 LAB — APTT: aPTT: 37 seconds — ABNORMAL HIGH (ref 24–36)

## 2018-10-11 LAB — PROTIME-INR
INR: 1.6 — ABNORMAL HIGH (ref 0.8–1.2)
Prothrombin Time: 19.2 seconds — ABNORMAL HIGH (ref 11.4–15.2)

## 2018-10-11 LAB — GLUCOSE, CAPILLARY
Glucose-Capillary: 97 mg/dL (ref 70–99)
Glucose-Capillary: 107 mg/dL — ABNORMAL HIGH (ref 70–99)
Glucose-Capillary: 128 mg/dL — ABNORMAL HIGH (ref 70–99)
Glucose-Capillary: 113 mg/dL — ABNORMAL HIGH (ref 70–99)

## 2018-10-11 LAB — PLATELET COUNT: Platelets: 129 10*3/uL — ABNORMAL LOW (ref 150–400)

## 2018-10-11 SURGERY — REPLACEMENT, AORTIC VALVE, OPEN
Anesthesia: General | Site: Chest

## 2018-10-11 MED ORDER — INSULIN REGULAR BOLUS VIA INFUSION
0.0000 [IU] | Freq: Three times a day (TID) | INTRAVENOUS | Status: DC
  Filled 2018-10-11: qty 10

## 2018-10-11 MED ORDER — NITROGLYCERIN 0.2 MG/ML ON CALL CATH LAB
INTRAVENOUS | Status: DC | PRN
  Administered 2018-10-11: 10 ug via INTRAVENOUS
  Administered 2018-10-11 (×7): 20 ug via INTRAVENOUS
  Administered 2018-10-11: 10 ug via INTRAVENOUS

## 2018-10-11 MED ORDER — ROCURONIUM BROMIDE 10 MG/ML (PF) SYRINGE
PREFILLED_SYRINGE | INTRAVENOUS | Status: AC
  Filled 2018-10-11: qty 10

## 2018-10-11 MED ORDER — MORPHINE SULFATE (PF) 2 MG/ML IV SOLN: 1.0000 mg | INTRAVENOUS | Status: DC | PRN

## 2018-10-11 MED ORDER — ASPIRIN EC 325 MG PO TBEC: 325.0000 mg | DELAYED_RELEASE_TABLET | Freq: Every day | ORAL | Status: DC

## 2018-10-11 MED ORDER — TRAMADOL HCL 50 MG PO TABS
50.0000 mg | ORAL_TABLET | ORAL | Status: DC | PRN
  Administered 2018-10-12 – 2018-10-14 (×5): 100 mg via ORAL
  Administered 2018-10-15 (×3): 50 mg via ORAL
  Administered 2018-10-15 – 2018-10-16 (×2): 100 mg via ORAL
  Administered 2018-10-16 – 2018-10-17 (×5): 50 mg via ORAL
  Filled 2018-10-11 (×2): qty 2
  Filled 2018-10-11: qty 1
  Filled 2018-10-11: qty 2
  Filled 2018-10-11: qty 1
  Filled 2018-10-11 (×6): qty 2
  Filled 2018-10-11 (×4): qty 1
  Filled 2018-10-11: qty 2

## 2018-10-11 MED ORDER — SODIUM CHLORIDE 0.9 % IR SOLN
Status: DC | PRN
  Administered 2018-10-11: 1000 mL
  Administered 2018-10-11: 5000 mL

## 2018-10-11 MED ORDER — CHLORHEXIDINE GLUCONATE 0.12 % MT SOLN
15.0000 mL | OROMUCOSAL | Status: AC
  Administered 2018-10-11: 19:00:00 15 mL via OROMUCOSAL

## 2018-10-11 MED ORDER — OXYCODONE HCL 5 MG PO TABS
5.0000 mg | ORAL_TABLET | ORAL | Status: DC | PRN
  Administered 2018-10-12: 04:00:00 5 mg via ORAL
  Administered 2018-10-12: 23:00:00 10 mg via ORAL
  Administered 2018-10-12 – 2018-10-17 (×2): 5 mg via ORAL
  Filled 2018-10-11 (×2): qty 1
  Filled 2018-10-11: qty 2
  Filled 2018-10-11: qty 1

## 2018-10-11 MED ORDER — PANTOPRAZOLE SODIUM 40 MG PO TBEC
40.0000 mg | DELAYED_RELEASE_TABLET | Freq: Every day | ORAL | Status: DC
  Administered 2018-10-13 – 2018-10-20 (×8): 40 mg via ORAL
  Filled 2018-10-11 (×8): qty 1

## 2018-10-11 MED ORDER — METOPROLOL TARTRATE 5 MG/5ML IV SOLN: 2.5000 mg | INTRAVENOUS | Status: DC | PRN

## 2018-10-11 MED ORDER — PROPOFOL 10 MG/ML IV BOLUS
INTRAVENOUS | Status: AC
  Filled 2018-10-11: qty 20

## 2018-10-11 MED ORDER — CHLORHEXIDINE GLUCONATE 0.12 % MT SOLN
OROMUCOSAL | Status: AC
  Administered 2018-10-11: 07:00:00 15 mL via OROMUCOSAL
  Filled 2018-10-11: qty 15

## 2018-10-11 MED ORDER — FENTANYL CITRATE (PF) 250 MCG/5ML IJ SOLN
INTRAMUSCULAR | Status: AC
  Filled 2018-10-11: qty 5

## 2018-10-11 MED ORDER — ONDANSETRON HCL 4 MG/2ML IJ SOLN
4.0000 mg | Freq: Four times a day (QID) | INTRAMUSCULAR | Status: DC | PRN
  Administered 2018-10-12 – 2018-10-15 (×3): 4 mg via INTRAVENOUS
  Filled 2018-10-11 (×4): qty 2

## 2018-10-11 MED ORDER — DOCUSATE SODIUM 100 MG PO CAPS
200.0000 mg | ORAL_CAPSULE | Freq: Every day | ORAL | Status: DC
  Administered 2018-10-12 – 2018-10-19 (×6): 200 mg via ORAL
  Filled 2018-10-11 (×7): qty 2

## 2018-10-11 MED ORDER — SODIUM CHLORIDE 0.45 % IV SOLN
INTRAVENOUS | Status: DC | PRN
  Administered 2018-10-11: 18:00:00 via INTRAVENOUS

## 2018-10-11 MED ORDER — POTASSIUM CHLORIDE 10 MEQ/50ML IV SOLN: 10.0000 meq | INTRAVENOUS | Status: AC

## 2018-10-11 MED ORDER — PHENYLEPHRINE HCL-NACL 20-0.9 MG/250ML-% IV SOLN
0.0000 ug/min | INTRAVENOUS | Status: DC
  Administered 2018-10-11: 18:00:00 60 ug/min via INTRAVENOUS
  Administered 2018-10-11: 21:00:00 75 ug/min via INTRAVENOUS
  Administered 2018-10-12: 100 ug/min via INTRAVENOUS
  Administered 2018-10-12: 06:00:00 55 ug/min via INTRAVENOUS
  Filled 2018-10-11 (×3): qty 250

## 2018-10-11 MED ORDER — SODIUM CHLORIDE 0.9% FLUSH: 3.0000 mL | INTRAVENOUS | Status: DC | PRN

## 2018-10-11 MED ORDER — SODIUM CHLORIDE 0.9 % IV SOLN
INTRAVENOUS | Status: DC | PRN
  Administered 2018-10-11: 750 mg via INTRAVENOUS

## 2018-10-11 MED ORDER — BISACODYL 5 MG PO TBEC
10.0000 mg | DELAYED_RELEASE_TABLET | Freq: Every day | ORAL | Status: DC
  Administered 2018-10-12 – 2018-10-19 (×6): 10 mg via ORAL
  Filled 2018-10-11 (×6): qty 2

## 2018-10-11 MED ORDER — VANCOMYCIN HCL IN DEXTROSE 1-5 GM/200ML-% IV SOLN
1000.0000 mg | Freq: Once | INTRAVENOUS | Status: AC
  Administered 2018-10-12: 1000 mg via INTRAVENOUS
  Filled 2018-10-11: qty 200

## 2018-10-11 MED ORDER — LIDOCAINE 2% (20 MG/ML) 5 ML SYRINGE
INTRAMUSCULAR | Status: DC | PRN
  Administered 2018-10-11: 100 mg via INTRAVENOUS

## 2018-10-11 MED ORDER — INSULIN REGULAR(HUMAN) IN NACL 100-0.9 UT/100ML-% IV SOLN: INTRAVENOUS | Status: DC

## 2018-10-11 MED ORDER — LACTATED RINGERS IV SOLN
INTRAVENOUS | Status: DC | PRN
  Administered 2018-10-11 (×2): via INTRAVENOUS

## 2018-10-11 MED ORDER — ALBUMIN HUMAN 5 % IV SOLN
12.5000 g | Freq: Once | INTRAVENOUS | Status: AC
  Administered 2018-10-11: 12.5 g via INTRAVENOUS

## 2018-10-11 MED ORDER — PROTAMINE SULFATE 10 MG/ML IV SOLN
INTRAVENOUS | Status: DC | PRN
  Administered 2018-10-11: 370 mg via INTRAVENOUS

## 2018-10-11 MED ORDER — SODIUM CHLORIDE 0.9 % IV SOLN
INTRAVENOUS | Status: DC | PRN
  Administered 2018-10-11: 20 ug/min via INTRAVENOUS

## 2018-10-11 MED ORDER — NITROGLYCERIN IN D5W 200-5 MCG/ML-% IV SOLN: 0.0000 ug/min | INTRAVENOUS | Status: DC

## 2018-10-11 MED ORDER — HEMOSTATIC AGENTS (NO CHARGE) OPTIME
TOPICAL | Status: DC | PRN
  Administered 2018-10-11 (×9): 1 via TOPICAL

## 2018-10-11 MED ORDER — SUCCINYLCHOLINE CHLORIDE 200 MG/10ML IV SOSY
PREFILLED_SYRINGE | INTRAVENOUS | Status: DC | PRN
  Administered 2018-10-11: 140 mg via INTRAVENOUS

## 2018-10-11 MED ORDER — CHLORHEXIDINE GLUCONATE 4 % EX LIQD: 30.0000 mL | CUTANEOUS | Status: DC

## 2018-10-11 MED ORDER — LACTATED RINGERS IV SOLN: INTRAVENOUS | Status: DC

## 2018-10-11 MED ORDER — ACETAMINOPHEN 650 MG RE SUPP
650.0000 mg | Freq: Once | RECTAL | Status: AC
  Administered 2018-10-11: 19:00:00 650 mg via RECTAL

## 2018-10-11 MED ORDER — CHLORHEXIDINE GLUCONATE CLOTH 2 % EX PADS
6.0000 | MEDICATED_PAD | Freq: Every day | CUTANEOUS | Status: DC
  Administered 2018-10-13 – 2018-10-19 (×7): 6 via TOPICAL

## 2018-10-11 MED ORDER — ESMOLOL HCL 100 MG/10ML IV SOLN
INTRAVENOUS | Status: DC | PRN
  Administered 2018-10-11 (×2): 20 mg via INTRAVENOUS

## 2018-10-11 MED ORDER — SODIUM CHLORIDE 0.9 % IV SOLN
INTRAVENOUS | Status: DC
  Administered 2018-10-11: 18:00:00 via INTRAVENOUS

## 2018-10-11 MED ORDER — SODIUM CHLORIDE 0.9 % IV SOLN
INTRAVENOUS | Status: DC | PRN
  Administered 2018-10-11: 10:00:00 1 [IU]/h via INTRAVENOUS

## 2018-10-11 MED ORDER — ACETAMINOPHEN 500 MG PO TABS
1000.0000 mg | ORAL_TABLET | Freq: Four times a day (QID) | ORAL | Status: DC
  Administered 2018-10-12: 06:00:00 1000 mg via ORAL
  Filled 2018-10-11: qty 2

## 2018-10-11 MED ORDER — ACETAMINOPHEN 160 MG/5ML PO SOLN
1000.0000 mg | Freq: Four times a day (QID) | ORAL | Status: DC
  Administered 2018-10-12: 1000 mg
  Filled 2018-10-11: qty 40.6

## 2018-10-11 MED ORDER — PROPOFOL 10 MG/ML IV BOLUS
INTRAVENOUS | Status: DC | PRN
  Administered 2018-10-11: 140 mg via INTRAVENOUS

## 2018-10-11 MED ORDER — MIDAZOLAM HCL 5 MG/5ML IJ SOLN
INTRAMUSCULAR | Status: DC | PRN
  Administered 2018-10-11 (×2): 1 mg via INTRAVENOUS
  Administered 2018-10-11: 2 mg via INTRAVENOUS
  Administered 2018-10-11: 1 mg via INTRAVENOUS

## 2018-10-11 MED ORDER — TRANEXAMIC ACID 1000 MG/10ML IV SOLN
1.5000 mg/kg/h | INTRAVENOUS | Status: DC
  Filled 2018-10-11: qty 25

## 2018-10-11 MED ORDER — HEPARIN SODIUM (PORCINE) 1000 UNIT/ML IJ SOLN
INTRAMUSCULAR | Status: DC | PRN
  Administered 2018-10-11: 37000 [IU] via INTRAVENOUS

## 2018-10-11 MED ORDER — SODIUM CHLORIDE 0.9 % IV SOLN
INTRAVENOUS | Status: DC
  Administered 2018-10-11: 19:00:00 via INTRAVENOUS

## 2018-10-11 MED ORDER — FAMOTIDINE IN NACL 20-0.9 MG/50ML-% IV SOLN
20.0000 mg | Freq: Two times a day (BID) | INTRAVENOUS | Status: AC
  Administered 2018-10-11 (×2): 20 mg via INTRAVENOUS
  Filled 2018-10-11: qty 50

## 2018-10-11 MED ORDER — LACTATED RINGERS IV SOLN: 500.0000 mL | Freq: Once | INTRAVENOUS | Status: DC | PRN

## 2018-10-11 MED ORDER — ORAL CARE MOUTH RINSE
15.0000 mL | OROMUCOSAL | Status: DC
  Administered 2018-10-11: 15 mL via OROMUCOSAL

## 2018-10-11 MED ORDER — ALBUMIN HUMAN 5 % IV SOLN
250.0000 mL | INTRAVENOUS | Status: AC | PRN
  Administered 2018-10-11 (×3): 12.5 g via INTRAVENOUS
  Filled 2018-10-11 (×3): qty 250

## 2018-10-11 MED ORDER — ASPIRIN 81 MG PO CHEW: 324.0000 mg | CHEWABLE_TABLET | Freq: Every day | ORAL | Status: DC

## 2018-10-11 MED ORDER — SODIUM CHLORIDE 0.9 % IV SOLN
1.5000 g | Freq: Two times a day (BID) | INTRAVENOUS | Status: AC
  Administered 2018-10-11 – 2018-10-13 (×4): 1.5 g via INTRAVENOUS
  Filled 2018-10-11 (×4): qty 1.5

## 2018-10-11 MED ORDER — ALBUMIN HUMAN 5 % IV SOLN
INTRAVENOUS | Status: DC | PRN
  Administered 2018-10-11 (×3): via INTRAVENOUS

## 2018-10-11 MED ORDER — CALCIUM CHLORIDE 10 % IV SOLN
INTRAVENOUS | Status: DC | PRN
  Administered 2018-10-11 (×5): .1 mg via INTRAVENOUS

## 2018-10-11 MED ORDER — CHLORHEXIDINE GLUCONATE 0.12 % MT SOLN
15.0000 mL | Freq: Once | OROMUCOSAL | Status: AC
  Administered 2018-10-11: 07:00:00 15 mL via OROMUCOSAL

## 2018-10-11 MED ORDER — ACETAMINOPHEN 160 MG/5ML PO SOLN: 650.0000 mg | Freq: Once | ORAL | Status: AC

## 2018-10-11 MED ORDER — LACTATED RINGERS IV SOLN
INTRAVENOUS | Status: DC | PRN
  Administered 2018-10-11: 09:00:00 via INTRAVENOUS

## 2018-10-11 MED ORDER — ROCURONIUM BROMIDE 10 MG/ML (PF) SYRINGE
PREFILLED_SYRINGE | INTRAVENOUS | Status: DC | PRN
  Administered 2018-10-11: 70 mg via INTRAVENOUS
  Administered 2018-10-11: 20 mg via INTRAVENOUS
  Administered 2018-10-11: 60 mg via INTRAVENOUS
  Administered 2018-10-11: 30 mg via INTRAVENOUS
  Administered 2018-10-11: 40 mg via INTRAVENOUS
  Administered 2018-10-11: 30 mg via INTRAVENOUS

## 2018-10-11 MED ORDER — SODIUM CHLORIDE 0.9% IV SOLUTION
Freq: Once | INTRAVENOUS | Status: AC
  Administered 2018-10-12: via INTRAVENOUS

## 2018-10-11 MED ORDER — METOPROLOL TARTRATE 12.5 MG HALF TABLET
ORAL_TABLET | ORAL | Status: AC
  Administered 2018-10-11: 07:00:00 12.5 mg via ORAL
  Filled 2018-10-11: qty 1

## 2018-10-11 MED ORDER — MAGNESIUM SULFATE 4 GM/100ML IV SOLN
4.0000 g | Freq: Once | INTRAVENOUS | Status: AC
  Administered 2018-10-11: 4 g via INTRAVENOUS
  Filled 2018-10-11: qty 100

## 2018-10-11 MED ORDER — METOPROLOL TARTRATE 12.5 MG HALF TABLET
12.5000 mg | ORAL_TABLET | Freq: Once | ORAL | Status: AC
  Administered 2018-10-11: 07:00:00 12.5 mg via ORAL

## 2018-10-11 MED ORDER — LIDOCAINE 2% (20 MG/ML) 5 ML SYRINGE
INTRAMUSCULAR | Status: AC
  Filled 2018-10-11: qty 5

## 2018-10-11 MED ORDER — SODIUM CHLORIDE 0.9% FLUSH
3.0000 mL | Freq: Two times a day (BID) | INTRAVENOUS | Status: DC
  Administered 2018-10-12 – 2018-10-17 (×6): 3 mL via INTRAVENOUS

## 2018-10-11 MED ORDER — BISACODYL 10 MG RE SUPP
10.0000 mg | Freq: Every day | RECTAL | Status: DC
  Filled 2018-10-11: qty 1

## 2018-10-11 MED ORDER — PHENYLEPHRINE 40 MCG/ML (10ML) SYRINGE FOR IV PUSH (FOR BLOOD PRESSURE SUPPORT)
PREFILLED_SYRINGE | INTRAVENOUS | Status: DC | PRN
  Administered 2018-10-11 (×2): 80 ug via INTRAVENOUS
  Administered 2018-10-11: 40 ug via INTRAVENOUS
  Administered 2018-10-11: 80 ug via INTRAVENOUS

## 2018-10-11 MED ORDER — MIDAZOLAM HCL (PF) 10 MG/2ML IJ SOLN
INTRAMUSCULAR | Status: AC
  Filled 2018-10-11: qty 2

## 2018-10-11 MED ORDER — FENTANYL CITRATE (PF) 250 MCG/5ML IJ SOLN
INTRAMUSCULAR | Status: DC | PRN
  Administered 2018-10-11: 25 ug via INTRAVENOUS
  Administered 2018-10-11: 100 ug via INTRAVENOUS
  Administered 2018-10-11: 125 ug via INTRAVENOUS
  Administered 2018-10-11: 25 ug via INTRAVENOUS
  Administered 2018-10-11 (×2): 100 ug via INTRAVENOUS
  Administered 2018-10-11: 50 ug via INTRAVENOUS
  Administered 2018-10-11: 25 ug via INTRAVENOUS
  Administered 2018-10-11: 100 ug via INTRAVENOUS

## 2018-10-11 MED ORDER — MIDAZOLAM HCL 2 MG/2ML IJ SOLN: 2.0000 mg | INTRAMUSCULAR | Status: DC | PRN

## 2018-10-11 MED ORDER — MILRINONE LACTATE IN DEXTROSE 20-5 MG/100ML-% IV SOLN
0.0000 ug/kg/min | INTRAVENOUS | Status: DC
  Administered 2018-10-11 – 2018-10-12 (×2): 0.375 ug/kg/min via INTRAVENOUS
  Administered 2018-10-12: 22:00:00 0.2 ug/kg/min via INTRAVENOUS
  Filled 2018-10-11 (×3): qty 100

## 2018-10-11 MED ORDER — CHLORHEXIDINE GLUCONATE 0.12% ORAL RINSE (MEDLINE KIT)
15.0000 mL | Freq: Two times a day (BID) | OROMUCOSAL | Status: DC
  Administered 2018-10-11: 22:00:00 15 mL via OROMUCOSAL

## 2018-10-11 MED ORDER — DEXMEDETOMIDINE HCL IN NACL 200 MCG/50ML IV SOLN
0.0000 ug/kg/h | INTRAVENOUS | Status: DC
  Administered 2018-10-11: 19:00:00 0.7 ug/kg/h via INTRAVENOUS
  Filled 2018-10-11: qty 50

## 2018-10-11 MED ORDER — SODIUM CHLORIDE 0.9 % IV SOLN: 250.0000 mL | INTRAVENOUS | Status: DC

## 2018-10-11 SURGICAL SUPPLY — 181 items
ADAPTER CARDIO PERF ANTE/RETRO (ADAPTER) ×3 IMPLANT
APPLICATOR COTTON TIP 6 STRL (MISCELLANEOUS) IMPLANT
APPLICATOR COTTON TIP 6IN STRL (MISCELLANEOUS)
ARTICLIP LAA PROCLIP II 45 (Clip) ×3 IMPLANT
ATTRACTOMAT 16X20 MAGNETIC DRP (DRAPES) ×3 IMPLANT
BAG DECANTER FOR FLEXI CONT (MISCELLANEOUS) ×6 IMPLANT
BASKET HEART (ORDER IN 25'S) (MISCELLANEOUS) ×1
BASKET HEART (ORDER IN 25S) (MISCELLANEOUS) ×2 IMPLANT
BLADE CLIPPER SURG (BLADE) IMPLANT
BLADE STERNUM SYSTEM 6 (BLADE) ×3 IMPLANT
BLADE SURG 11 STRL SS (BLADE) ×6 IMPLANT
BNDG ELASTIC 4X5.8 VLCR STR LF (GAUZE/BANDAGES/DRESSINGS) ×3 IMPLANT
BNDG ELASTIC 6X5.8 VLCR STR LF (GAUZE/BANDAGES/DRESSINGS) ×3 IMPLANT
BNDG GAUZE ELAST 4 BULKY (GAUZE/BANDAGES/DRESSINGS) ×3 IMPLANT
CANISTER SUCT 3000ML PPV (MISCELLANEOUS) ×3 IMPLANT
CANN PRFSN 3/8X14X24FR PCFC (MISCELLANEOUS)
CANN PRFSN 3/8XCNCT ST RT ANG (MISCELLANEOUS)
CANNULA EZ GLIDE AORTIC 21FR (CANNULA) ×3 IMPLANT
CANNULA FEM VENOUS REMOTE 22FR (CANNULA) ×3 IMPLANT
CANNULA GUNDRY RCSP 15FR (MISCELLANEOUS) ×3 IMPLANT
CANNULA PRFSN 3/8X14X24FR PCFC (MISCELLANEOUS) IMPLANT
CANNULA PRFSN 3/8XCNCT RT ANG (MISCELLANEOUS) IMPLANT
CANNULA SUMP PERICARDIAL (CANNULA) ×6 IMPLANT
CANNULA VEN MTL TIP RT (MISCELLANEOUS)
CANNULA VENNOUS METAL TIP 20FR (CANNULA) ×3 IMPLANT
CATH CPB KIT OWEN (MISCELLANEOUS) ×3 IMPLANT
CATH HEART VENT LEFT (CATHETERS) ×2 IMPLANT
CATH THORACIC 28FR (CATHETERS) IMPLANT
CATH THORACIC 28FR RT ANG (CATHETERS) IMPLANT
CATH THORACIC 36FR (CATHETERS) ×3 IMPLANT
CATH THORACIC 36FR RT ANG (CATHETERS) IMPLANT
CLIP FOGARTY SPRING 6M (CLIP) IMPLANT
CLIP RETRACTION 3.0MM CORONARY (MISCELLANEOUS) ×3 IMPLANT
CLIP VESOCCLUDE MED 24/CT (CLIP) IMPLANT
CLIP VESOCCLUDE SM WIDE 24/CT (CLIP) ×9 IMPLANT
CONN 1/2X1/2X1/2  BEN (MISCELLANEOUS) ×1
CONN 1/2X1/2X1/2 BEN (MISCELLANEOUS) ×2 IMPLANT
CONN 3/8X1/2 ST GISH (MISCELLANEOUS) ×6 IMPLANT
CONN ST 1/4X3/8  BEN (MISCELLANEOUS) ×1
CONN ST 1/4X3/8 BEN (MISCELLANEOUS) ×2 IMPLANT
CONNECTOR 1/2X3/8X1/2 3 WAY (MISCELLANEOUS) ×1
CONNECTOR 1/2X3/8X1/2 3WAY (MISCELLANEOUS) ×2 IMPLANT
CONT SPEC 4OZ CLIKSEAL STRL BL (MISCELLANEOUS) ×6 IMPLANT
COUNTER NEEDLE 20 DBL MAG RED (NEEDLE) ×3 IMPLANT
COVER PROBE W GEL 5X96 (DRAPES) ×3 IMPLANT
COVER SURGICAL LIGHT HANDLE (MISCELLANEOUS) ×3 IMPLANT
COVER WAND RF STERILE (DRAPES) IMPLANT
DEFOGGER ANTIFOG KIT (MISCELLANEOUS) ×3 IMPLANT
DERMABOND ADVANCED (GAUZE/BANDAGES/DRESSINGS) ×2
DERMABOND ADVANCED .7 DNX12 (GAUZE/BANDAGES/DRESSINGS) ×4 IMPLANT
DEVICE ATRICLIP LAA PRCLPII 45 (Clip) ×2 IMPLANT
DEVICE CLOSURE PERCLS PRGLD 6F (VASCULAR PRODUCTS) ×4 IMPLANT
DEVICE SUT CK QUICK LOAD MINI (Prosthesis & Implant Heart) ×15 IMPLANT
DRAIN CHANNEL 32F RND 10.7 FF (WOUND CARE) ×9 IMPLANT
DRAPE CARDIOVASCULAR INCISE (DRAPES) ×1
DRAPE CV SPLIT W-CLR ANES SCRN (DRAPES) IMPLANT
DRAPE INCISE IOBAN 66X45 STRL (DRAPES) ×6 IMPLANT
DRAPE PERI GROIN 82X75IN TIB (DRAPES) IMPLANT
DRAPE SLUSH/WARMER DISC (DRAPES) ×3 IMPLANT
DRAPE SRG 135X102X78XABS (DRAPES) ×2 IMPLANT
DRSG AQUACEL AG ADV 3.5X14 (GAUZE/BANDAGES/DRESSINGS) ×3 IMPLANT
DRSG COVADERM 4X14 (GAUZE/BANDAGES/DRESSINGS) IMPLANT
ELECT BLADE 4.0 EZ CLEAN MEGAD (MISCELLANEOUS) ×3
ELECT REM PT RETURN 9FT ADLT (ELECTROSURGICAL) ×6
ELECTRODE BLDE 4.0 EZ CLN MEGD (MISCELLANEOUS) ×2 IMPLANT
ELECTRODE REM PT RTRN 9FT ADLT (ELECTROSURGICAL) ×4 IMPLANT
FELT TEFLON 1X6 (MISCELLANEOUS) ×6 IMPLANT
GAUZE SPONGE 4X4 12PLY STRL (GAUZE/BANDAGES/DRESSINGS) ×6 IMPLANT
GAUZE SPONGE 4X4 12PLY STRL LF (GAUZE/BANDAGES/DRESSINGS) ×3 IMPLANT
GLOVE BIO SURGEON STRL SZ 6 (GLOVE) ×3 IMPLANT
GLOVE BIO SURGEON STRL SZ 6.5 (GLOVE) ×21 IMPLANT
GLOVE BIO SURGEON STRL SZ7 (GLOVE) ×3 IMPLANT
GLOVE BIO SURGEON STRL SZ7.5 (GLOVE) ×21 IMPLANT
GLOVE BIOGEL PI IND STRL 6.5 (GLOVE) ×4 IMPLANT
GLOVE BIOGEL PI IND STRL 7.0 (GLOVE) ×2 IMPLANT
GLOVE BIOGEL PI INDICATOR 6.5 (GLOVE) ×2
GLOVE BIOGEL PI INDICATOR 7.0 (GLOVE) ×1
GLOVE ORTHO TXT STRL SZ7.5 (GLOVE) ×12 IMPLANT
GOWN EXTRA PROTECTION XL (GOWNS) ×9 IMPLANT
GOWN STRL REUS W/ TWL LRG LVL3 (GOWN DISPOSABLE) ×16 IMPLANT
GOWN STRL REUS W/TWL LRG LVL3 (GOWN DISPOSABLE) ×8
HEMOSTAT POWDER SURGIFOAM 1G (HEMOSTASIS) ×12 IMPLANT
INSERT FOGARTY XLG (MISCELLANEOUS) ×3 IMPLANT
KIT BASIN OR (CUSTOM PROCEDURE TRAY) ×3 IMPLANT
KIT DILATOR VASC 18G NDL (KITS) ×3 IMPLANT
KIT DRAINAGE VACCUM ASSIST (KITS) ×3 IMPLANT
KIT SUCTION CATH 14FR (SUCTIONS) ×15 IMPLANT
KIT SUT CK MINI COMBO 4X17 (Prosthesis & Implant Heart) ×3 IMPLANT
KIT TURNOVER KIT B (KITS) ×3 IMPLANT
KIT VASOVIEW HEMOPRO 2 VH 4000 (KITS) ×3 IMPLANT
LEAD PACING MYOCARDI (MISCELLANEOUS) ×3 IMPLANT
LINE VENT (MISCELLANEOUS) ×3 IMPLANT
LOOP VESSEL SUPERMAXI WHITE (MISCELLANEOUS) ×3 IMPLANT
MARKER GRAFT CORONARY BYPASS (MISCELLANEOUS) ×3 IMPLANT
NS IRRIG 1000ML POUR BTL (IV SOLUTION) ×15 IMPLANT
PACK E OPEN HEART (SUTURE) ×3 IMPLANT
PACK OPEN HEART (CUSTOM PROCEDURE TRAY) ×3 IMPLANT
PAD ARMBOARD 7.5X6 YLW CONV (MISCELLANEOUS) ×6 IMPLANT
PAD ELECT DEFIB RADIOL ZOLL (MISCELLANEOUS) ×3 IMPLANT
PENCIL BUTTON HOLSTER BLD 10FT (ELECTRODE) ×3 IMPLANT
PERCLOSE PROGLIDE 6F (VASCULAR PRODUCTS) ×6
POSITIONER HEAD DONUT 9IN (MISCELLANEOUS) ×3 IMPLANT
POWDER SURGICEL 3.0 GRAM (HEMOSTASIS) ×3 IMPLANT
PUNCH AORTIC ROT 4.0MM RCL 40 (MISCELLANEOUS) ×3 IMPLANT
PUNCH AORTIC ROTATE 4.0MM (MISCELLANEOUS) IMPLANT
PUNCH AORTIC ROTATE 4.5MM 8IN (MISCELLANEOUS) IMPLANT
PUNCH AORTIC ROTATE 5MM 8IN (MISCELLANEOUS) IMPLANT
SET CARDIOPLEGIA MPS 5001102 (MISCELLANEOUS) ×3 IMPLANT
SET IRRIG TUBING LAPAROSCOPIC (IRRIGATION / IRRIGATOR) ×3 IMPLANT
SHEATH PINNACLE 8F 10CM (SHEATH) ×3 IMPLANT
SOL ANTI FOG 6CC (MISCELLANEOUS) ×2 IMPLANT
SOLUTION ANTI FOG 6CC (MISCELLANEOUS) ×1
SPONGE LAP 18X18 RF (DISPOSABLE) ×12 IMPLANT
SPONGE LAP 4X18 RFD (DISPOSABLE) ×3 IMPLANT
STOPCOCK 4 WAY LG BORE MALE ST (IV SETS) ×3 IMPLANT
SUCTION CARDIAC 10 FR ×3 IMPLANT
SUT BONE WAX W31G (SUTURE) ×6 IMPLANT
SUT ETHIBON 2 0 V 52N 30 (SUTURE) ×9 IMPLANT
SUT ETHIBON EXCEL 2-0 V-5 (SUTURE) IMPLANT
SUT ETHIBOND 2 0 SH (SUTURE) ×9 IMPLANT
SUT ETHIBOND 2 0 SH 36X2 (SUTURE) ×6 IMPLANT
SUT ETHIBOND 2 0 V4 (SUTURE) IMPLANT
SUT ETHIBOND 2 0V4 GREEN (SUTURE) IMPLANT
SUT ETHIBOND 4 0 RB 1 (SUTURE) IMPLANT
SUT ETHIBOND 4 0 TF (SUTURE) IMPLANT
SUT ETHIBOND 5 0 C 1 30 (SUTURE) IMPLANT
SUT ETHIBOND V-5 VALVE (SUTURE) IMPLANT
SUT ETHIBOND X763 2 0 SH 1 (SUTURE) ×9 IMPLANT
SUT MNCRL AB 3-0 PS2 18 (SUTURE) ×6 IMPLANT
SUT MNCRL AB 4-0 PS2 18 (SUTURE) IMPLANT
SUT PDS AB 1 CTX 36 (SUTURE) ×6 IMPLANT
SUT PROLENE 2 0 SH DA (SUTURE) IMPLANT
SUT PROLENE 3 0 SH 1 (SUTURE) ×3 IMPLANT
SUT PROLENE 3 0 SH DA (SUTURE) ×12 IMPLANT
SUT PROLENE 3 0 SH1 36 (SUTURE) ×6 IMPLANT
SUT PROLENE 4 0 RB 1 (SUTURE) ×7
SUT PROLENE 4 0 SH DA (SUTURE) ×3 IMPLANT
SUT PROLENE 4-0 RB1 .5 CRCL 36 (SUTURE) ×14 IMPLANT
SUT PROLENE 5 0 C 1 36 (SUTURE) ×6 IMPLANT
SUT PROLENE 6 0 C 1 30 (SUTURE) ×9 IMPLANT
SUT PROLENE 7.0 RB 3 (SUTURE) ×6 IMPLANT
SUT PROLENE 8 0 BV175 6 (SUTURE) ×3 IMPLANT
SUT PROLENE BLUE 7 0 (SUTURE) ×3 IMPLANT
SUT PROLENE POLY MONO (SUTURE) ×6 IMPLANT
SUT SILK  1 MH (SUTURE) ×3
SUT SILK 1 MH (SUTURE) ×6 IMPLANT
SUT SILK 2 0 SH CR/8 (SUTURE) IMPLANT
SUT SILK 3 0 SH CR/8 (SUTURE) IMPLANT
SUT STEEL 6MS V (SUTURE) IMPLANT
SUT STEEL STERNAL CCS#1 18IN (SUTURE) IMPLANT
SUT STEEL SZ 6 DBL 3X14 BALL (SUTURE) ×6 IMPLANT
SUT VIC AB 1 CTX 36 (SUTURE)
SUT VIC AB 1 CTX36XBRD ANBCTR (SUTURE) IMPLANT
SUT VIC AB 2-0 CT1 27 (SUTURE) ×1
SUT VIC AB 2-0 CT1 TAPERPNT 27 (SUTURE) ×2 IMPLANT
SUT VIC AB 2-0 CTX 27 (SUTURE) IMPLANT
SUT VIC AB 3-0 SH 27 (SUTURE)
SUT VIC AB 3-0 SH 27X BRD (SUTURE) IMPLANT
SUT VIC AB 3-0 SH 8-18 (SUTURE) ×3 IMPLANT
SUT VIC AB 3-0 X1 27 (SUTURE) IMPLANT
SUT VICRYL 4-0 PS2 18IN ABS (SUTURE) IMPLANT
SYSTEM SAHARA CHEST DRAIN ATS (WOUND CARE) ×6 IMPLANT
SYSTEM SAHARA CHEST DRAIN RE-I (WOUND CARE) ×6 IMPLANT
TAPE CLOTH SURG 4X10 WHT LF (GAUZE/BANDAGES/DRESSINGS) ×3 IMPLANT
TAPE PAPER 2X10 WHT MICROPORE (GAUZE/BANDAGES/DRESSINGS) ×3 IMPLANT
TOWEL GREEN STERILE (TOWEL DISPOSABLE) ×3 IMPLANT
TOWEL GREEN STERILE FF (TOWEL DISPOSABLE) ×3 IMPLANT
TRAY CATH LUMEN 1 20CM STRL (SET/KITS/TRAYS/PACK) ×3 IMPLANT
TRAY FOLEY SLVR 16FR TEMP STAT (SET/KITS/TRAYS/PACK) ×6 IMPLANT
TUBE CONNECTING 20X1/4 (TUBING) ×3 IMPLANT
TUBE SUCT INTRACARD DLP 20F (MISCELLANEOUS) ×3 IMPLANT
TUBE SUCTION CARDIAC 10FR (CANNULA) IMPLANT
TUBING ART PRESS 48 MALE/FEM (TUBING) ×6 IMPLANT
TUBING LAP HI FLOW INSUFFLATIO (TUBING) ×3 IMPLANT
UNDERPAD 30X30 (UNDERPADS AND DIAPERS) ×6 IMPLANT
VALVE AORTIC SZ23 INSP/RESIL (Prosthesis & Implant Heart) ×3 IMPLANT
VALVE MITRAL SZ 27 (Prosthesis & Implant Heart) ×3 IMPLANT
VENT LEFT HEART 12002 (CATHETERS) ×3
WATER STERILE IRR 1000ML POUR (IV SOLUTION) ×6 IMPLANT
WIRE EMERALD 3MM-J .035X150CM (WIRE) ×3 IMPLANT
YANKAUER SUCT BULB TIP NO VENT (SUCTIONS) ×3 IMPLANT

## 2018-10-11 NOTE — Brief Op Note (Signed)
10/11/2018  5:34 PM  PATIENT:  [name]  68 y.o. male  PRE-OPERATIVE DIAGNOSIS:  Aortic Stenosis Mitral Stenosis Coronary Artery Disease  POST-OPERATIVE DIAGNOSIS:  Aortic Stenosis Mitral Stenosis Coronary Artery Disease  PROCEDURE:  Procedure(s): AORTIC VALVE REPLACEMENT (AVR) with 23 Inspiris Bioprosthetic Aortic valve. (N/A) MITRAL VALVE (MV) REPLACEMENT with 27 Mosaic Bioprosthetic Mitral valve. (N/A) CORONARY ARTERY BYPASS GRAFTING (CABG) x 2 using LIMA to the LAD and endoscopic greater saphenous vein harvesting to the RCA. (N/A) TRANSESOPHAGEAL ECHOCARDIOGRAM (TEE) (N/A) CLIPPING OF LEFT ATRIAL APPENDAGE with 45 AtriCure Clip. (N/A) CLOSURE OF PATENT FORAMEN OVALE (N/A)  SURGEON:  Surgeon(s) and Role:    Rexene Alberts, MD - Primary  PHYSICIAN ASSISTANT: WAYNE GOLD PA-C  ANESTHESIA:   general  EBL:  600 mL   BLOOD ADMINISTERED:2 FFP and 2 PLTS  DRAINS: ROUTINE PLEURAL AND PERICARDAL CHET TUBES   LOCAL MEDICATIONS USED:  NONE  SPECIMEN:  Source of Specimen:  AORTIC AND MITRAL VALVE LEAFLETS  DISPOSITION OF SPECIMEN:  PATHOLOGY  COUNTS:  YES  TOURNIQUET:  * No tourniquets in log *  DICTATION: .Dragon Dictation  PLAN OF CARE: Admit to inpatient   PATIENT DISPOSITION:  ICU - intubated and hemodynamically stable.   Delay start of Pharmacological VTE agent (>24hrs) due to surgical blood loss or risk of bleeding: yes

## 2018-10-11 NOTE — Progress Notes (Addendum)
Admitted to 2 H via bed.  Oozing from chest tubes. put out 325 from MS in less than 1 hour.  Dr Roxy Manns aware. He saw chest xray post and did not recommend any changes in tubes.  1900 report given at bedside to [facility].  Dr. Roxy Manns wishes to be called with any changes / needs tonight. Passed this on to oncoming RN. Wife into see patient gave update on status  discussed vistting policy hours. Received password and gave her our number.

## 2018-10-11 NOTE — Interval H&P Note (Signed)
History and Physical Interval Note:  10/11/2018 7:19 AM  [name]  has presented today for surgery, with the diagnosis of AS MS CAD.  The various methods of treatment have been discussed with the patient and family. After consideration of risks, benefits and other options for treatment, the patient has consented to  Procedure(s): AORTIC VALVE REPLACEMENT (AVR) (N/A) MITRAL VALVE (MV) REPLACEMENT (N/A) CORONARY ARTERY BYPASS GRAFTING (CABG) (N/A) TRANSESOPHAGEAL ECHOCARDIOGRAM (TEE) (N/A) as a surgical intervention.  The patient's history has been reviewed, patient examined, no change in status, stable for surgery.  I have reviewed the patient's chart and labs.  Questions were answered to the patient's satisfaction.     Rexene Alberts

## 2018-10-11 NOTE — Anesthesia Procedure Notes (Signed)
Procedure Name: Intubation Performed by: Milford Cage, CRNA Pre-anesthesia Checklist: Patient identified, Emergency Drugs available, Suction available and Patient being monitored Patient Re-evaluated:Patient Re-evaluated prior to induction Oxygen Delivery Method: Circle System Utilized Preoxygenation: Pre-oxygenation with 100% oxygen Induction Type: IV induction Ventilation: Mask ventilation without difficulty Laryngoscope Size: Glidescope and 4 Grade View: Grade I Tube type: Oral Tube size: 8.0 mm Number of attempts: 1 Airway Equipment and Method: Rigid stylet and Video-laryngoscopy Placement Confirmation: ETT inserted through vocal cords under direct vision,  positive ETCO2 and breath sounds checked- equal and bilateral Secured at: 23 cm Tube secured with: Tape Dental Injury: Teeth and Oropharynx as per pre-operative assessment  Difficulty Due To: Difficulty was anticipated and Difficult Airway- due to anterior larynx

## 2018-10-11 NOTE — Transfer of Care (Signed)
Immediate Anesthesia Transfer of Care Note  Patient: [name]  Procedure(s) Performed: AORTIC VALVE REPLACEMENT (AVR) with 23 Inspiris Bioprosthetic Aortic valve. (N/A Chest) MITRAL VALVE (MV) REPLACEMENT with 27 Mosaic Bioprosthetic Mitral valve. (N/A Chest) CORONARY ARTERY BYPASS GRAFTING (CABG) x 2 using LIMA to the LAD and endoscopic greater saphenous vein harvesting to the RCA. (N/A Chest) TRANSESOPHAGEAL ECHOCARDIOGRAM (TEE) (N/A ) CLIPPING OF LEFT ATRIAL APPENDAGE with 45 AtriCure Clip. (N/A Chest) CLOSURE OF PATENT FORAMEN OVALE (N/A )  Patient Location: ICU  Anesthesia Type:General  Level of Consciousness: sedated and Patient remains intubated per anesthesia plan  Airway & Oxygen Therapy: Patient remains intubated per anesthesia plan and Patient placed on Ventilator (see vital sign flow sheet for setting)  Post-op Assessment: Report given to RN and Post -op Vital signs reviewed and stable  Post vital signs: Reviewed and stable  Last Vitals:  Vitals Value Taken Time  BP    Temp    Pulse    Resp    SpO2      Last Pain:  Vitals:   10/11/18 0655  TempSrc: Oral  PainSc:          Complications: No apparent anesthesia complications

## 2018-10-11 NOTE — Op Note (Signed)
CARDIOTHORACIC SURGERY OPERATIVE NOTE  Date of Procedure:  10/11/2018  Preoperative Diagnosis:   History of Radiation Therapy to Mediastinum  Severe Aortic Stenosis with Moderate Aortic Insufficiency  Moderate Mitral Stenosis with Moderate-Severe Mitral Regurgitation  Severe Multi-vessel Coronary Artery Disease  Patent Foramen Ovale  Postoperative Diagnosis: Same  Procedure:    Aortic Valve Replacement  Edwards Inspiris Resilia Stented Bovine Pericardial Tissue Valve (size 32mm, ref # [id number], serial # [id number])    Mitral Valve Replacement  Medtronic Mosaic Stented Porcine Tissue Valve (size 51mm, model # 310, serial # [id number])   Coronary Artery Bypass Grafting x 2  Left Internal Mammary Artery to Distal Left Anterior Descending Coronary Artery  Saphenous Vein Graft to Distal Right Coronary Artery  Endoscopic Vein Harvest from Right Thigh    Closure of Patent Foramen Ovale    Clipping of Left Atrial Appendage  Atricure [id number] left atrial clip (size 19mm)   Placement of Femoral Arterial Line   Surgeon: Valentina Gu. Roxy Manns, MD  Assistant: John Giovanni, PA-C  Anesthesia: Belinda Block, MD  Operative Findings:  Severe radiation-induced mediastinal fibrosis with calcification of aorta, aortic root, aortic valve and mitral valve  Severe aortic stenosis with moderate aortic insufficiency  Moderate mitral stenosis with moderate-severe mitral regurgitation  Normal left ventricular systolic function  Good quality left internal mammary artery conduit  Good quality saphenous vein conduit  Fair quality target vessels for grafting  Patent foramen ovale              BRIEF CLINICAL NOTE AND INDICATIONS FOR SURGERY  Patient is a 68 year old moderately obese male with history of aortic stenosis, mitral stenosis, hyperlipidemia, previous thymectomy with radiation therapy to the chest, and longstanding tobacco abuse who has been referred for surgical  consultation to discuss treatment options for management of severe symptomatic aortic stenosis, mitral stenosis, and multivessel coronary artery disease.  Patient states that he was first noted to have a heart murmur on physical exam 4 or 5 years ago. Echocardiograms have documented the presence of aortic stenosis and mitral stenosis with normal left ventricular systolic function. The patient has been followed for the last several years by Dr. Angelena Form and recent follow-up transthoracic echocardiogram performed Jun 27, 2018 revealed significant progression of disease including severe aortic stenosis and severe mitral stenosis. Left ventricular systolic function remains normal with ejection fraction estimated greater than 65%. Peak velocity across the aortic valve measured 4.2 m/s corresponding to mean transvalvular gradient estimated 45 mmHg. Mean gradient across mitral valve with estimated 13 mmHg corresponding to mitral valve area 1.1 cm using pressure half-time. Left and right heart catheterization was performed September 15, 2018 and confirmed the presence of severe aortic stenosis with peak to peak and mean transvalvular gradients measured 32 and 34 mmHg, respectively. There was severe multivessel coronary artery disease with long segment proximal and mid stenosis of the left anterior descending coronary artery and 100% chronic occlusion of the right coronary artery. There was no significant flow-limiting disease in the left circumflex territory. Right heart pressures were moderately elevated. Cardiothoracic surgical consultation was requested.  The patient has been seen in consultation and counseled at length regarding the indications, risks and potential benefits of surgery.  All questions have been answered, and the patient provides full informed consent for the operation as described.    DETAILS OF THE OPERATIVE PROCEDURE  Preparation:  The patient is brought to the operating room on the  above mentioned date and central monitoring was established by the anesthesia  team including placement of Swan-Ganz catheter and radial arterial line.  There was moderate pulmonary hypertension at baseline.  The patient is placed in the supine position on the operating table.  Intravenous antibiotics are administered. General endotracheal anesthesia is induced uneventfully. A Foley catheter is placed.  Baseline transesophageal echocardiogram was performed.  Findings were notable for severe aortic stenosis with moderate aortic insufficiency, moderate mitral stenosis with moderate to severe mitral regurgitation, normal left and right ventricular function.  There was a patent foramen ovale.  There was mild tricuspid regurgitation with normal tricuspid annular diameter.  No other significant abnormalities were noted.  The patient's chest, abdomen, both groins, and both lower extremities are prepared and draped in a sterile manner. A time out procedure is performed.   Percutaneous Vascular Access and Placement of Femoral Arterial Line:  Percutaneous venous access were obtained on the right side.  Using ultrasound guidance the right common femoral vein was cannulated using the Seldinger technique and a pair of Perclose vascular closure devises were placed, after which time an 8 French sheath inserted.  The right common femoral artery was cannulated using the Seldinger technique and a 16-gauge femoral arterial cannula is placed for monitoring purposes.     Surgical Approach and Conduit Harvest:  A median sternotomy incision was performed and the left internal mammary artery is dissected from the chest wall and prepared for bypass grafting.  Of note, dissection of the left internal mammary artery was tedious due to diffuse fibrosis and scarring from previous mediastinal radiation.  The fibrosis also afflicted the medial surface of the left upper lobe which had to be dissected off of the parietal pleura in the  mediastinum using sharp dissection and electrocautery.  The left internal mammary artery is notably good quality conduit. Simultaneously, saphenous vein is obtained from the patient's right thigh using endoscopic vein harvest technique. The saphenous vein is notably good quality conduit. After removal of the saphenous vein, the small surgical incisions in the lower extremity are closed with absorbable suture. Following systemic heparinization, the left internal mammary artery was transected distally noted to have excellent flow.   Extracorporeal Cardiopulmonary Bypass and Myocardial Protection:  The pericardium is opened.  There was no pericardial effusion.  The ascending aorta is severely diseased in appearance with diffuse fibrosis and calcification.  There is also fibrosis and calcification surrounding the aortic root and there is calcification throughout the AV groove posteriorly.    The ascending aorta is cannulated for cardiopulmonary bypass.  The right common femoral vein is cannulated through the venous sheath and a guidewire advanced into the right atrium using TEE guidance.  The femoral vein cannulated using a 22 Fr long femoral venous cannula.  A retrograde cardioplegia cannula is placed through the right atrium into the coronary sinus.  The operative field was continuously flooded with carbon dioxide gas.  The entire pre-bypass portion of the operation was notable for stable hemodynamics.  Cardiopulmonary bypass was begun and a second venous cannula is placed directly into the superior vena cava.  The surface of the heart inspected.  At this juncture a small perforation in the coronary sinus is noted from the retrograde cannula.  The retrograde cannula is removed.  Distal target vessels are selected for coronary artery bypass grafting. A cardioplegia cannula is placed in the ascending aorta.  A temperature probe was placed in the interventricular septum.  The patient is cooled to 28C  systemic temperature.  The aortic cross clamp is applied and cardioplegia is  delivered initially in an antegrade fashion through the aortic root using modified [place] cold blood cardioplegia ([place] blood cardioplegia protocol).   The initial cardioplegic arrest is somewhat slow due to significant aortic insufficiency.  Adequate diastolic arrest is achieved.  Repeat doses of cardioplegia are administered at 60 minutes and every 30 minutes thereafter through subsequently placed vein graft and directly into the left main coronary artery using hand-held cannulas in order to maintain completely flat electrocardiogram and septal myocardial temperature below 18C.  Myocardial protection was felt to be excellent.  The small hole in the coronary sinus was repaired using a patch of autologous pericardium sewn circumferentially with running 5-0 Prolene suture.   Coronary Artery Bypass Grafting:   The distal right coronary artery was grafted using a reversed saphenous vein graft in an end-to-side fashion.  At the site of distal anastomosis the target vessel was good quality and measured approximately 1.5 mm in diameter.  The distal left anterior coronary artery was grafted with the left internal mammary artery in an end-to-side fashion.  At the site of distal anastomosis the target vessel was fair quality and measured approximately 2.0 mm in diameter.  There was diffuse calcification throughout this vessel.   Clipping of Left Atrial Appendage:  The left atrial appendage is obliterated using an Atricure Pro245 left atrial clip placed under direct vision, size 45 mm.     Aortic Valve Excision:  An oblique transverse aortotomy incision was performed relatively high in the aorta because of severe calcification and disease throughout the aortic root.  The aortic valve was inspected and notable for severe aortic stenosis.  The aortic valve leaflets were excised sharply and the aortic annulus decalcified.   Decalcification was notably tedious due to extensive calcification throughout the entire aortic root, aortic annulus, and extending down through the left ventricular outflow tract towards the mitral valve.  The aortic annulus was sized to accept a 23 mm prosthesis.  The aortic root and left ventricle were irrigated with copious cold saline solution.    Repeat cardioplegia is now administered directly into the left main coronary artery and simultaneously through the saphenous vein graft to the right coronary artery.   Closure of Patent Foramen Ovale:  A left atriotomy incision is performed posteriorly through the intra-atrial groove.  Immediately upon entry into the left atrium a large patent foramen ovale is readily apparent along the left atrial surface of the fossa ovalis.  The patent foramen ovale is closed using simple running 4-0 Prolene suture.   Mitral Valve Replacement:  The mitral valve is exposed using a self-retaining retractor.  Exposure was felt to be excellent.  The mitral valve is severely calcified throughout.  The mitral annulus is extensively calcified.  The subvalvular apparatus is not thickened, nor foreshortened, consistent with underlying radiation-induced valvulopathy and not at all suggestive of rheumatic disease.  The entire anterior leaflet of the mitral valve was excised sharply.  This is difficult due to extensive calcification.  The anterior portion of the mitral annulus is decalcified.  This is tedious.  Portion of some of the subvalvular apparatus from the anterior leaflet is preserved and incorporated laterally into the valve replacement suture line.  The posterior leaflet is split in the midline and debulked.  The mitral annulus is sized to accept a 27 mm stented bioprosthetic tissue valve.  Mitral valve replacement is performed using interrupted 2-0 Ethibond horizontal mattress pledgeted sutures with pledgets in the supra annular position.  The mitral valve is replaced  using a Medtronic Mosaic stented porcine tissue valve (size 26mm, model #310, serial # [id number]) that is rinsed and prepared for implantation per manufacturer protocol.  The valve is lowered into place with care to orient the valve such that both stent posts are straddling the left ventricular outflow tract.  All valve sutures are secured using Cor-knot.  The valve is irrigated with saline and carefully examined to make sure there is normal leaflet mobility and no sign of communication around the sewing ring.  A sump drain is placed across the mitral valve to serve as a left ventricular vent and the left atriotomy incision is closed with a 2 layer closure of running 3-0 Prolene suture.   Aortic Valve Replacement:  Another dose of cardioplegia is administered directly into the left main coronary artery and through the saphenous vein graft to the right coronary artery.  The aortic annulus was again examined and sized to accept a 23 mm prosthesis.  Aortic valve replacement was performed using interrupted horizontal mattress 2-0 Ethibond pledgeted sutures with pledgets in the subannular position.  An Edwards Inspiris Resilia stented bovine pericardial tissue valve (size 23 mm, ref # [id number], serial # [id number]) was implanted uneventfully. The valve seated appropriately with adequate space beneath the left main and right coronary artery.  The aortotomy was closed using a 2-layer closure of running 4-0 Prolene suture with Teflon felt strips utilized to buttress the suture line.   Procedure Completion:  The single proximal vein graft anastomosis was placed directly to the ascending aorta prior to removal of the aortic cross clamp.  The septal myocardial temperature rose rapidly after reperfusion of the left internal mammary artery graft.  All air was evacuated through the aortic root.  The aortic cross clamp was removed after a total cross clamp time of 232 minutes.  All proximal and distal coronary  anastomoses were inspected for hemostasis and appropriate graft orientation. Epicardial pacing wires are fixed to the inferior right ventricular freewall and to the right atrial appendage. The patient is rewarmed to 37C temperature. The aortic and left ventricular vents were removed.  The superior vena cava cannula is removed.  The patient is weaned and disconnected from cardiopulmonary bypass.  The patient's rhythm at separation from bypass was AV paced.  The patient was weaned from cardiopulmonary bypass on milrinone at 0.375 mcg/kg/min. Total cardiopulmonary bypass time for the operation was 258 minutes.  Followup transesophageal echocardiogram performed after separation from bypass revealed well-seated bioprosthetic tissue valves in the aortic and mitral position that were functioning normally.  There was no paravalvular leak.  The patent foramen ovale was no longer appreciated.  Left and right ventricular function appeared normal.  There were otherwise no changes from the preoperative exam.  The aortic cannula was removed uneventfully. Protamine was administered to reverse the anticoagulation.  The femoral venous cannula was removed and Perclose suture secured.  The mediastinum and pleural spaces were inspected for hemostasis and irrigated with saline solution. The mediastinum and both pleural spaces were drained using 4 chest tubes placed through separate stab incisions inferiorly.  The sternum is closed with double strength sternal wire. The soft tissues anterior to the sternum were closed in multiple layers and the skin is closed with a running subcuticular skin closure.  The post-bypass portion of the operation was notable for stable rhythm and hemodynamics.  The patient received a total of 2 packs adult platelets and 2 units fresh frozen plasma due to coagulopathy and thrombocytopenia after separation from cardiopulmonary  bypass and reversal of heparin with protamine.    Disposition:  The  patient tolerated the procedure well and is transported to the surgical intensive care in stable condition. There are no intraoperative complications. All sponge instrument and needle counts are verified correct at completion of the operation.   Valentina Gu. Roxy Manns MD 10/11/2018 5:41 PM

## 2018-10-11 NOTE — Anesthesia Procedure Notes (Addendum)
Arterial Line Insertion Start/End9/03/2018 8:20 AM, 10/11/2018 8:40 AM Performed by: Belinda Block, MD, anesthesiologist  Patient location: Pre-op. Preanesthetic checklist: patient identified, IV checked, site marked, risks and benefits discussed, surgical consent, monitors and equipment checked, pre-op evaluation, timeout performed and anesthesia consent Lidocaine 1% used for infiltration Left, brachial was placed Catheter size: 20 G Hand hygiene performed , maximum sterile barriers used  and Seldinger technique used  Attempts: 5 or more Procedure performed without using ultrasound guided technique. Following insertion, dressing applied. Post procedure assessment: normal and unchanged  Patient tolerated the procedure well with no immediate complications. Additional procedure comments: Multiple attempts with radial artery then successful brachial attempt.

## 2018-10-11 NOTE — Anesthesia Postprocedure Evaluation (Signed)
Anesthesia Post Note  Patient: [name]  Procedure(s) Performed: AORTIC VALVE REPLACEMENT (AVR) with 23 Inspiris Bioprosthetic Aortic valve. (N/A Chest) MITRAL VALVE (MV) REPLACEMENT with 27 Mosaic Bioprosthetic Mitral valve. (N/A Chest) CORONARY ARTERY BYPASS GRAFTING (CABG) x 2 using LIMA to the LAD and endoscopic greater saphenous vein harvesting to the RCA. (N/A Chest) TRANSESOPHAGEAL ECHOCARDIOGRAM (TEE) (N/A ) CLIPPING OF LEFT ATRIAL APPENDAGE with 45 AtriCure Clip. (N/A Chest) CLOSURE OF PATENT FORAMEN OVALE (N/A )     Patient location during evaluation: SICU Anesthesia Type: General Level of consciousness: patient remains intubated per anesthesia plan Pain management: satisfactory to patient Vital Signs Assessment: post-procedure vital signs reviewed and stable Respiratory status: patient remains intubated per anesthesia plan Cardiovascular status: stable Postop Assessment: no apparent nausea or vomiting Anesthetic complications: no    Last Vitals:  Vitals:   10/11/18 0655 10/11/18 1757  BP: (!) 130/52   Pulse: 80   Resp: 18   Temp: 36.8 C   SpO2: 99% 93%    Last Pain:  Vitals:   10/11/18 0655  TempSrc: Oral  PainSc:                  Delta Pichon

## 2018-10-11 NOTE — Anesthesia Procedure Notes (Signed)
Central Venous Catheter Insertion Performed by: Myrtie Soman, MD, anesthesiologist Start/End9/03/2018 7:33 AM, 10/11/2018 7:50 AM Patient location: Pre-op. Preanesthetic checklist: patient identified, IV checked, site marked, risks and benefits discussed, surgical consent, monitors and equipment checked, pre-op evaluation, timeout performed and anesthesia consent Position: Trendelenburg Lidocaine 1% used for infiltration and patient sedated Hand hygiene performed , maximum sterile barriers used  and Seldinger technique used Catheter size: 9 Fr Total catheter length 10. Central line was placed.MAC introducer Swan type:thermodilution PA Cath depth:48 Procedure performed using ultrasound guided technique. Ultrasound Notes:anatomy identified, needle tip was noted to be adjacent to the nerve/plexus identified, no ultrasound evidence of intravascular and/or intraneural injection and image(s) printed for medical record Attempts: 1 Following insertion, line sutured, dressing applied and Biopatch. Post procedure assessment: blood return through all ports, free fluid flow and no air  Patient tolerated the procedure well with no immediate complications.

## 2018-10-11 NOTE — Progress Notes (Signed)
  Echocardiogram Echocardiogram Transesophageal has been performed.  Ramirez Fullbright L Androw 10/11/2018, 9:56 AM

## 2018-10-11 NOTE — Anesthesia Procedure Notes (Signed)
Anesthesia Procedure Image    

## 2018-10-11 NOTE — Anesthesia Preprocedure Evaluation (Addendum)
Anesthesia Evaluation  Patient identified by MRN, date of birth, ID band Patient awake    Reviewed: Allergy & Precautions, NPO status , Patient's Chart, lab work & pertinent test results  Airway Mallampati: II  TM Distance: >3 FB     Dental   Pulmonary former smoker,    breath sounds clear to auscultation       Cardiovascular + CAD  + Valvular Problems/Murmurs  Rhythm:Regular Rate:Normal + Systolic murmurs    Neuro/Psych    GI/Hepatic negative GI ROS, Neg liver ROS,   Endo/Other  negative endocrine ROS  Renal/GU negative Renal ROS     Musculoskeletal   Abdominal   Peds  Hematology   Anesthesia Other Findings   Reproductive/Obstetrics                            Anesthesia Physical Anesthesia Plan  ASA: IV  Anesthesia Plan: General   Post-op Pain Management:    Induction: Intravenous  PONV Risk Score and Plan: 2 and Midazolam and Treatment may vary due to age or medical condition  Airway Management Planned: Oral ETT  Additional Equipment:   Intra-op Plan:   Post-operative Plan: Post-operative intubation/ventilation  Informed Consent: I have reviewed the patients History and Physical, chart, labs and discussed the procedure including the risks, benefits and alternatives for the proposed anesthesia with the patient or authorized representative who has indicated his/her understanding and acceptance.     Dental advisory given  Plan Discussed with: Anesthesiologist, CRNA and Surgeon  Anesthesia Plan Comments:        Anesthesia Quick Evaluation

## 2018-10-12 ENCOUNTER — Encounter (HOSPITAL_COMMUNITY): Payer: Self-pay | Admitting: Thoracic Surgery (Cardiothoracic Vascular Surgery)

## 2018-10-12 ENCOUNTER — Inpatient Hospital Stay (HOSPITAL_COMMUNITY): Payer: Medicare HMO

## 2018-10-12 DIAGNOSIS — I442 Atrioventricular block, complete: Secondary | ICD-10-CM

## 2018-10-12 DIAGNOSIS — Z953 Presence of xenogenic heart valve: Secondary | ICD-10-CM

## 2018-10-12 LAB — POCT I-STAT 7, (LYTES, BLD GAS, ICA,H+H)
Acid-base deficit: 2 mmol/L (ref 0.0–2.0)
Acid-base deficit: 5 mmol/L — ABNORMAL HIGH (ref 0.0–2.0)
Acid-base deficit: 5 mmol/L — ABNORMAL HIGH (ref 0.0–2.0)
Acid-base deficit: 6 mmol/L — ABNORMAL HIGH (ref 0.0–2.0)
Bicarbonate: 19.4 mmol/L — ABNORMAL LOW (ref 20.0–28.0)
Bicarbonate: 19.9 mmol/L — ABNORMAL LOW (ref 20.0–28.0)
Bicarbonate: 20.2 mmol/L (ref 20.0–28.0)
Bicarbonate: 24.2 mmol/L (ref 20.0–28.0)
Calcium, Ion: 1.12 mmol/L — ABNORMAL LOW (ref 1.15–1.40)
Calcium, Ion: 1.14 mmol/L — ABNORMAL LOW (ref 1.15–1.40)
Calcium, Ion: 1.14 mmol/L — ABNORMAL LOW (ref 1.15–1.40)
Calcium, Ion: 1.14 mmol/L — ABNORMAL LOW (ref 1.15–1.40)
HCT: 20 % — ABNORMAL LOW (ref 39.0–52.0)
HCT: 22 % — ABNORMAL LOW (ref 39.0–52.0)
HCT: 22 % — ABNORMAL LOW (ref 39.0–52.0)
HCT: 23 % — ABNORMAL LOW (ref 39.0–52.0)
Hemoglobin: 6.8 g/dL — CL (ref 13.0–17.0)
Hemoglobin: 7.5 g/dL — ABNORMAL LOW (ref 13.0–17.0)
Hemoglobin: 7.5 g/dL — ABNORMAL LOW (ref 13.0–17.0)
Hemoglobin: 7.8 g/dL — ABNORMAL LOW (ref 13.0–17.0)
O2 Saturation: 96 %
O2 Saturation: 97 %
O2 Saturation: 97 %
O2 Saturation: 99 %
Patient temperature: 35.9
Patient temperature: 37.1
Patient temperature: 37.5
Patient temperature: 37.5
Potassium: 4.3 mmol/L (ref 3.5–5.1)
Potassium: 4.4 mmol/L (ref 3.5–5.1)
Potassium: 4.6 mmol/L (ref 3.5–5.1)
Potassium: 4.6 mmol/L (ref 3.5–5.1)
Sodium: 142 mmol/L (ref 135–145)
Sodium: 144 mmol/L (ref 135–145)
Sodium: 144 mmol/L (ref 135–145)
Sodium: 144 mmol/L (ref 135–145)
TCO2: 20 mmol/L — ABNORMAL LOW (ref 22–32)
TCO2: 21 mmol/L — ABNORMAL LOW (ref 22–32)
TCO2: 21 mmol/L — ABNORMAL LOW (ref 22–32)
TCO2: 26 mmol/L (ref 22–32)
pCO2 arterial: 33.7 mmHg (ref 32.0–48.0)
pCO2 arterial: 35.3 mmHg (ref 32.0–48.0)
pCO2 arterial: 35.5 mmHg (ref 32.0–48.0)
pCO2 arterial: 45.4 mmHg (ref 32.0–48.0)
pH, Arterial: 7.33 — ABNORMAL LOW (ref 7.350–7.450)
pH, Arterial: 7.349 — ABNORMAL LOW (ref 7.350–7.450)
pH, Arterial: 7.366 (ref 7.350–7.450)
pH, Arterial: 7.383 (ref 7.350–7.450)
pO2, Arterial: 152 mmHg — ABNORMAL HIGH (ref 83.0–108.0)
pO2, Arterial: 83 mmHg (ref 83.0–108.0)
pO2, Arterial: 92 mmHg (ref 83.0–108.0)
pO2, Arterial: 97 mmHg (ref 83.0–108.0)

## 2018-10-12 LAB — GLUCOSE, CAPILLARY
Glucose-Capillary: 107 mg/dL — ABNORMAL HIGH (ref 70–99)
Glucose-Capillary: 110 mg/dL — ABNORMAL HIGH (ref 70–99)
Glucose-Capillary: 111 mg/dL — ABNORMAL HIGH (ref 70–99)
Glucose-Capillary: 117 mg/dL — ABNORMAL HIGH (ref 70–99)
Glucose-Capillary: 119 mg/dL — ABNORMAL HIGH (ref 70–99)
Glucose-Capillary: 123 mg/dL — ABNORMAL HIGH (ref 70–99)
Glucose-Capillary: 126 mg/dL — ABNORMAL HIGH (ref 70–99)
Glucose-Capillary: 129 mg/dL — ABNORMAL HIGH (ref 70–99)
Glucose-Capillary: 130 mg/dL — ABNORMAL HIGH (ref 70–99)
Glucose-Capillary: 134 mg/dL — ABNORMAL HIGH (ref 70–99)
Glucose-Capillary: 137 mg/dL — ABNORMAL HIGH (ref 70–99)
Glucose-Capillary: 140 mg/dL — ABNORMAL HIGH (ref 70–99)
Glucose-Capillary: 142 mg/dL — ABNORMAL HIGH (ref 70–99)
Glucose-Capillary: 148 mg/dL — ABNORMAL HIGH (ref 70–99)
Glucose-Capillary: 174 mg/dL — ABNORMAL HIGH (ref 70–99)

## 2018-10-12 LAB — BPAM PLATELET PHERESIS
Blood Product Expiration Date: 202009031523
Blood Product Expiration Date: 202009032359
ISSUE DATE / TIME: 202009021536
ISSUE DATE / TIME: 202009021536
Unit Type and Rh: 5100
Unit Type and Rh: 7300

## 2018-10-12 LAB — POCT I-STAT, CHEM 8
BUN: 13 mg/dL (ref 8–23)
Calcium, Ion: 1.14 mmol/L — ABNORMAL LOW (ref 1.15–1.40)
Chloride: 109 mmol/L (ref 98–111)
Creatinine, Ser: 0.8 mg/dL (ref 0.61–1.24)
Glucose, Bld: 119 mg/dL — ABNORMAL HIGH (ref 70–99)
HCT: 19 % — ABNORMAL LOW (ref 39.0–52.0)
Hemoglobin: 6.5 g/dL — CL (ref 13.0–17.0)
Potassium: 4.4 mmol/L (ref 3.5–5.1)
Sodium: 142 mmol/L (ref 135–145)
TCO2: 21 mmol/L — ABNORMAL LOW (ref 22–32)

## 2018-10-12 LAB — BASIC METABOLIC PANEL
Anion gap: 10 (ref 5–15)
Anion gap: 8 (ref 5–15)
BUN: 12 mg/dL (ref 8–23)
BUN: 13 mg/dL (ref 8–23)
CO2: 19 mmol/L — ABNORMAL LOW (ref 22–32)
CO2: 19 mmol/L — ABNORMAL LOW (ref 22–32)
Calcium: 7.7 mg/dL — ABNORMAL LOW (ref 8.9–10.3)
Calcium: 7.9 mg/dL — ABNORMAL LOW (ref 8.9–10.3)
Chloride: 111 mmol/L (ref 98–111)
Chloride: 113 mmol/L — ABNORMAL HIGH (ref 98–111)
Creatinine, Ser: 0.94 mg/dL (ref 0.61–1.24)
Creatinine, Ser: 0.98 mg/dL (ref 0.61–1.24)
GFR calc Af Amer: >60 mL/min (ref >60)
GFR calc Af Amer: >60 mL/min (ref >60)
GFR calc non Af Amer: >60 mL/min (ref >60)
GFR calc non Af Amer: >60 mL/min (ref >60)
Glucose, Bld: 122 mg/dL — ABNORMAL HIGH (ref 70–99)
Glucose, Bld: 140 mg/dL — ABNORMAL HIGH (ref 70–99)
Potassium: 4.3 mmol/L (ref 3.5–5.1)
Potassium: 4.4 mmol/L (ref 3.5–5.1)
Sodium: 140 mmol/L (ref 135–145)
Sodium: 140 mmol/L (ref 135–145)

## 2018-10-12 LAB — ECHO INTRAOPERATIVE TEE
Height: 69 in
Weight: 3736 oz

## 2018-10-12 LAB — BPAM FFP
Blood Product Expiration Date: 202009072359
Blood Product Expiration Date: 202009072359
ISSUE DATE / TIME: 202009021554
ISSUE DATE / TIME: 202009021554
Unit Type and Rh: 1700
Unit Type and Rh: 7300

## 2018-10-12 LAB — PREPARE FRESH FROZEN PLASMA
Unit division: 0
Unit division: 0

## 2018-10-12 LAB — POCT I-STAT 4, (NA,K, GLUC, HGB,HCT)
Glucose, Bld: 75 mg/dL (ref 70–99)
HCT: 25 % — ABNORMAL LOW (ref 39.0–52.0)
Hemoglobin: 8.5 g/dL — ABNORMAL LOW (ref 13.0–17.0)
Potassium: 4.3 mmol/L (ref 3.5–5.1)
Sodium: 143 mmol/L (ref 135–145)

## 2018-10-12 LAB — CBC
HCT: 25.8 % — ABNORMAL LOW (ref 39.0–52.0)
Hemoglobin: 8.3 g/dL — ABNORMAL LOW (ref 13.0–17.0)
MCH: 28.8 pg (ref 26.0–34.0)
MCHC: 32.2 g/dL (ref 30.0–36.0)
MCV: 89.6 fL (ref 80.0–100.0)
Platelets: 141 10*3/uL — ABNORMAL LOW (ref 150–400)
RBC: 2.88 MIL/uL — ABNORMAL LOW (ref 4.22–5.81)
RDW: 14.7 % (ref 11.5–15.5)
WBC: 11 10*3/uL — ABNORMAL HIGH (ref 4.0–10.5)
nRBC: 0 % (ref 0.0–0.2)

## 2018-10-12 LAB — PREPARE PLATELET PHERESIS
Unit division: 0
Unit division: 0

## 2018-10-12 LAB — MAGNESIUM
Magnesium: 2.9 mg/dL — ABNORMAL HIGH (ref 1.7–2.4)
Magnesium: 3.4 mg/dL — ABNORMAL HIGH (ref 1.7–2.4)

## 2018-10-12 LAB — PREPARE RBC (CROSSMATCH)

## 2018-10-12 MED ORDER — INSULIN ASPART 100 UNIT/ML ~~LOC~~ SOLN
0.0000 [IU] | SUBCUTANEOUS | Status: DC
  Administered 2018-10-12: 17:00:00 2 [IU] via SUBCUTANEOUS
  Administered 2018-10-12: 20:00:00 4 [IU] via SUBCUTANEOUS
  Administered 2018-10-12: 2 [IU] via SUBCUTANEOUS
  Administered 2018-10-13 (×4): 4 [IU] via SUBCUTANEOUS

## 2018-10-12 MED ORDER — ENOXAPARIN SODIUM 30 MG/0.3ML ~~LOC~~ SOLN
30.0000 mg | Freq: Every day | SUBCUTANEOUS | Status: DC
  Administered 2018-10-13 – 2018-10-16 (×4): 30 mg via SUBCUTANEOUS
  Filled 2018-10-12 (×4): qty 0.3

## 2018-10-12 MED ORDER — WARFARIN - PHYSICIAN DOSING INPATIENT
Freq: Every day | Status: DC
  Administered 2018-10-14 – 2018-10-15 (×2)
  Administered 2018-10-17: 1

## 2018-10-12 MED ORDER — FUROSEMIDE 10 MG/ML IJ SOLN
20.0000 mg | Freq: Four times a day (QID) | INTRAMUSCULAR | Status: AC
  Administered 2018-10-12 – 2018-10-13 (×5): 20 mg via INTRAVENOUS
  Filled 2018-10-12 (×5): qty 2

## 2018-10-12 MED ORDER — WARFARIN SODIUM 2.5 MG PO TABS
2.5000 mg | ORAL_TABLET | Freq: Every day | ORAL | Status: DC
  Administered 2018-10-12 – 2018-10-14 (×3): 2.5 mg via ORAL
  Filled 2018-10-12 (×3): qty 1

## 2018-10-12 MED ORDER — ASPIRIN 81 MG PO CHEW
81.0000 mg | CHEWABLE_TABLET | ORAL | Status: DC
  Administered 2018-10-13 – 2018-10-19 (×4): 81 mg via ORAL
  Filled 2018-10-12 (×4): qty 1

## 2018-10-12 MED ORDER — ASPIRIN EC 325 MG PO TBEC: 325.0000 mg | DELAYED_RELEASE_TABLET | Freq: Every day | ORAL | Status: DC

## 2018-10-12 MED ORDER — ORAL CARE MOUTH RINSE
15.0000 mL | Freq: Two times a day (BID) | OROMUCOSAL | Status: DC
  Administered 2018-10-12 – 2018-10-17 (×7): 15 mL via OROMUCOSAL

## 2018-10-12 MED FILL — Heparin Sodium (Porcine) Inj 1000 Unit/ML: INTRAMUSCULAR | Qty: 30 | Status: AC

## 2018-10-12 MED FILL — Lidocaine HCl Local Preservative Free (PF) Inj 2%: INTRAMUSCULAR | Qty: 15 | Status: AC

## 2018-10-12 MED FILL — Potassium Chloride Inj 2 mEq/ML: INTRAVENOUS | Qty: 40 | Status: AC

## 2018-10-12 NOTE — Progress Notes (Signed)
Right femoral arterial line removed by RN per order. Manual pressure held for 10 minutes. Patient remained hemodynamically stable. Site is a level 0. Pressure dressing applied.

## 2018-10-12 NOTE — Progress Notes (Signed)
Pt following commands, able to lift head off pillow. SICU heart wean started at this time.

## 2018-10-12 NOTE — Consult Note (Addendum)
ELECTROPHYSIOLOGY CONSULT NOTE    Patient ID: [name] MRN: [id number], DOB/AGE: 68-19-1952 68 y.o.  Admit date: 10/11/2018 Date of Consult: 10/12/2018  Primary Physician: Denita Lung, MD Primary Cardiologist: Dr. Angelena Form Electrophysiologist: New  Referring Provider: Dr. Roxy Manns  Patient Profile: [name] is a 68 y.o. male with a history of aortic stenosis, mitral stenosis, CAD, HLD, previous thymectomy with radiation therapy to the chest, and tobacco abuse who is being seen today for the evaluation of CHB s/p AV replacement, MV replacement, and CABG at the request of Dr. Roxy Manns.  HPI:  [name] is a 68 y.o. male admitted 10/11/2018 for planned AVR, MVR, and CABG by Dr. Roxy Manns.   Has been progressing relatively well post op, but noted to have new CHB with wide QRS intrinsic under pacer wires. Not currently on any AV nodal blockade.   He is feeling OK currently, considering.  Sore from surgery. Denies chest pain, palpitations, dyspnea, PND, orthopnea, nausea, vomiting, dizziness, or syncope.   Past Medical History:  Diagnosis Date   BCE (basal cell epithelioma), arm    RIGHT SHOULDER   Dyslipidemia    Esophageal stricture    Hyperlipidemia    Incidental pulmonary nodule, > 8mm and < 35mm 10/04/2018   Noted on CTA   Inguinal hernia    bilateral   Obesity    S/P aortic valve replacement with bioprosthetic valve 10/11/2018   23 mm Edwards Inspiris Resilia stented bovine pericardial tissue valve   S/P CABG x 2 10/11/2018   LIMA to LAD, SVG to RCA, EVH via right thigh   S/P mitral valve replacement with bioprosthetic valve 10/11/2018   27 mm Medtronic Mosaic stented porcine bioprosthetic tissue valve   S/P patent foramen ovale closure 10/11/2018   Smoker      Surgical History:  Past Surgical History:  Procedure Laterality Date   COLONOSCOPY  03/2008,2004   Dr. Amedeo Plenty   HERNIA REPAIR  12/11/10   BI[facility]   RIGHT/LEFT HEART CATH AND CORONARY ANGIOGRAPHY N/A  09/15/2018   Procedure: RIGHT/LEFT HEART CATH AND CORONARY ANGIOGRAPHY;  Surgeon: Burnell Blanks, MD;  Location: [facility];  Service: Cardiovascular;  Laterality: N/A;   TEE WITHOUT CARDIOVERSION N/A 12/12/2015   Procedure: TRANSESOPHAGEAL ECHOCARDIOGRAM (TEE);  Surgeon: Sanda Klein, MD;  Location: [facility];  Service: Cardiovascular;  Laterality: N/A;   THYMECTOMY  1976     Medications Prior to Admission  Medication Sig Dispense Refill Last Dose   aspirin 81 MG tablet Take 81 mg by mouth every other day.    10/10/2018 at Unknown time   ibuprofen (ADVIL) 200 MG tablet Take 400 mg by mouth every 6 (six) hours as needed for moderate pain.   Past Week at Unknown time   nicotine polacrilex (COMMIT) 2 MG lozenge Take 2 mg by mouth as needed for smoking cessation.   10/09/2018 at Unknown time   oxymetazoline (AFRIN) 0.05 % nasal spray Place 1 spray into both nostrils at bedtime as needed for congestion.   10/09/2018    Inpatient Medications:   acetaminophen  1,000 mg Oral Q6H   [START ON 10/13/2018] aspirin  81 mg Oral QODAY   bisacodyl  10 mg Oral Daily   Or   bisacodyl  10 mg Rectal Daily   Chlorhexidine Gluconate Cloth  6 each Topical Daily   docusate sodium  200 mg Oral Daily   [START ON 10/13/2018] enoxaparin (LOVENOX) injection  30 mg Subcutaneous QHS  insulin aspart  0-24 Units Subcutaneous Q4H   mouth rinse  15 mL Mouth Rinse BID   [START ON 10/13/2018] pantoprazole  40 mg Oral Daily   sodium chloride flush  3 mL Intravenous Q12H   warfarin  2.5 mg Oral q1800   Warfarin - Physician Dosing Inpatient   Does not apply q1800    Allergies: No Known Allergies  Social History   Socioeconomic History   Marital status: Married    Spouse name: Not on file   Number of children: 0   Years of education: Not on file   Highest education level: Not on file  Occupational History   Occupation: Programme researcher, broadcasting/film/video: [employer]  Social Needs    Financial resource strain: Not on file   Food insecurity    Worry: Not on file    Inability: Not on file   Transportation needs    Medical: Not on file    Non-medical: Not on file  Tobacco Use   Smoking status: Former Smoker    Packs/day: 0.25    Years: 35.00    Pack years: 8.75    Types: Cigarettes    Quit date: 09/04/2018    Years since quitting: 0.1   Smokeless tobacco: Never Used  Substance and Sexual Activity   Alcohol use: Yes    Alcohol/week: 1.0 standard drinks    Types: 1 Cans of beer per week   Drug use: No   Sexual activity: Yes  Lifestyle   Physical activity    Days per week: Not on file    Minutes per session: Not on file   Stress: Not on file  Relationships   Social connections    Talks on phone: Not on file    Gets together: Not on file    Attends religious service: Not on file    Active member of club or organization: Not on file    Attends meetings of clubs or organizations: Not on file    Relationship status: Not on file   Intimate partner violence    Fear of current or ex partner: Not on file    Emotionally abused: Not on file    Physically abused: Not on file    Forced sexual activity: Not on file  Other Topics Concern   Not on file  Social History Narrative   Not on file     Family History  Problem Relation Age of Onset   Cancer Mother 36       colon   Cancer Father 2       lung   CAD Neg Hx      Review of Systems: All other systems reviewed and are otherwise negative except as noted above.  Physical Exam: Vitals:   10/12/18 0900 10/12/18 0915 10/12/18 0930 10/12/18 0945  BP: 117/66     Pulse: 80     Resp: 19 16 19 15   Temp:      TempSrc:      SpO2: 97%     Weight:      Height:        GEN- The patient is stable post-op appearing, alert and oriented x 3 today.   HEENT: normocephalic, atraumatic; sclera clear, conjunctiva pink; hearing intact; oropharynx clear; neck supple Lungs- Clear to ausculation  bilaterally, normal work of breathing.  No wheezes, rales, rhonchi Heart- Regular rate and rhythm, no murmurs, rubs or gallops. Sternotomy dressing C/D/I GI- soft, non-tender, non-distended, bowel sounds present Extremities- no clubbing  or cyanosis. 1+ ankle edema. DP/PT/radial pulses 2+ bilaterally MS- no significant deformity or atrophy Skin- warm and dry, no rash or lesion Psych- euthymic mood, full affect Neuro- strength and sensation are intact  Labs:   Lab Results  Component Value Date   WBC 11.0 (H) 10/12/2018   HGB 7.8 (L) 10/12/2018   HCT 23.0 (L) 10/12/2018   MCV 89.6 10/12/2018   PLT 141 (L) 10/12/2018    Recent Labs  Lab 10/09/18 1543  10/12/18 0422 10/12/18 0500  NA 135   < > 140 142  K 3.5   < > 4.4 4.3  CL 105   < > 111  --   CO2 17*   < > 19*  --   BUN 21   < > 12  --   CREATININE 1.02   < > 0.94  --   CALCIUM 8.9   < > 7.9*  --   PROT 7.0  --   --   --   BILITOT 0.5  --   --   --   ALKPHOS 56  --   --   --   ALT 16  --   --   --   AST 18  --   --   --   GLUCOSE 125*   < > 140*  --    < > = values in this interval not displayed.      Radiology/Studies: Dg Chest 2 View  Result Date: 10/09/2018 CLINICAL DATA:  68 year old male under preoperative evaluation prior to heart surgery. EXAM: CHEST - 2 VIEW COMPARISON:  No priors. FINDINGS: Lung volumes are normal. No consolidative airspace disease. No pleural effusions. No pneumothorax. No pulmonary nodule or mass noted. Pulmonary vasculature and the cardiomediastinal silhouette are within normal limits. Surgical clips project over the anterior mediastinum. IMPRESSION: No radiographic evidence of acute cardiopulmonary disease. Electronically Signed   By: Vinnie Langton M.D.   On: 10/09/2018 16:44   Ct Coronary Morph W/cta Cor W/score W/ca W/cm &/or Wo/cm  Addendum Date: 10/04/2018   ADDENDUM REPORT: 10/04/2018 16:33 EXAM: OVER-READ INTERPRETATION  CT CHEST The following report is an over-read performed by  radiologist Dr. Collene Leyden [facility], [place] on 10/04/2018. This over-read does not include interpretation of cardiac or coronary anatomy or pathology. The coronary CTA interpretation by the cardiologist is attached. COMPARISON:  None. FINDINGS: Heart is mildly enlarged. Aortic atherosclerosis. Aorta is normal caliber. No mediastinal, hilar, or axillary adenopathy. Small to moderate-sized hiatal hernia. Lungs are clear. No focal airspace opacities or suspicious nodules. No effusions. Imaging into the upper abdomen shows no acute findings. Chest wall soft tissues are unremarkable. No acute bony abnormality. IMPRESSION: No acute extra cardiac abnormality. Aortic atherosclerosis. Small to moderate hiatal hernia. Electronically Signed   By: Rolm Baptise M.D.   On: 10/04/2018 16:33   Result Date: 10/04/2018 CLINICAL DATA:  68 year old male with CAD, severe aortic stenosis and mitral stenosis being evaluated prior to AVR, MVR and CABG. EXAM: Cardiac TAVR CT TECHNIQUE: The patient was scanned on a [employer]. A 120 kV retrospective scan was triggered in the descending thoracic aorta at 111 HU's. Gantry rotation speed was 250 msecs and collimation was .6 mm. No beta blockade or nitro were given. The 3D data set was reconstructed in 5% intervals of the R-R cycle. Systolic and diastolic phases were analyzed on a dedicated work station using MPR, MIP and VRT modes. The patient received 80 cc of contrast. FINDINGS: Aortic Valve:  Aortic valve is trileaflet with severely thickened and calcified leaflets with severe diffuse calcifications extending into the LVOT and continuing into severe circumferential mitral annular calcifications. Aorta: Normal size with mild diffuse atherosclerotic plaque and calcifications, no dissection. Sinotubular Junction: 29 x 28 mm Ascending Thoracic Aorta: 38 x 37 mm Aortic Arch: 35 x 34 mm Descending Thoracic Aorta: 25 x 25 mm Sinus of Valsalva Measurements: Non-coronary: 33 mm  Right -coronary: 29 mm Left -coronary: 32 mm Coronary Artery Height above Annulus: Left Main: 16 mm Right Coronary: 19 mm Virtual Basal Annulus Measurements: Maximum/Minimum Diameter: 31.7 x 25.5 mm Mean Diameter: 28.4 mm Perimeter: 92.3 mm Area: 632 mm2 IMPRESSION: 1. Aortic valve is trileaflet with severely thickened and calcified leaflets with severe diffuse calcifications extending into the LVOT and continuing into severe circumferential mitral annular calcifications. 2. Thoracic aorta has normal size with mild diffuse atherosclerotic disease and calcifications. 3. No thrombus in the left atrial appendage. Electronically Signed: By: Ena Dawley On: 10/04/2018 16:13   Dg Chest Port 1 View  Result Date: 10/12/2018 CLINICAL DATA:  Status post coronary bypass graft. EXAM: PORTABLE CHEST 1 VIEW COMPARISON:  Radiograph of October 11, 2018. FINDINGS: Stable cardiomegaly. Endotracheal and nasogastric tubes have been removed. Right internal jugular Swan-Ganz catheter is unchanged. Stable bilateral chest tubes are noted. No pneumothorax is noted. Mild bibasilar subsegmental atelectasis is noted. Bony thorax is unremarkable. IMPRESSION: Endotracheal and nasogastric tubes have been removed. Bilateral chest tubes are noted without pneumothorax. Mild bibasilar subsegmental atelectasis is noted. Electronically Signed   By: Marijo Conception M.D.   On: 10/12/2018 08:31   Dg Chest Port 1 View  Result Date: 10/11/2018 CLINICAL DATA:  Postop CABG EXAM: PORTABLE CHEST 1 VIEW COMPARISON:  10/09/2018 FINDINGS: Endotracheal tube tip at the thoracic inlet. Interval sternotomy and valve prostheses. Esophageal tube tip in the left upper quadrant. Mediastinal and lower chest drainage catheters. Ascending catheter over the mediastinum with tip at the level of aortic arch. Right IJ Swan-Ganz catheter with tip projecting over the pulmonary outflow. Low lung volumes. Heart size within normal limits. Upper mediastinal silhouette is  enlarged with convex bulging. Aortic atherosclerosis. Opacity in the right upper lobe, likely atelectasis given displacement of the fissure. No definitive pneumothorax seen. IMPRESSION: 1. Endotracheal tube tip is just beyond thoracic inlet. Interval postsurgical changes of the mediastinum with placement of support lines and tubes as above. 2. Partial atelectasis of the right upper lobe. Enlarged upper mediastinal silhouette with bilateral paratracheal opacity and mild convex configuration peripheral to arch calcifications, presumably related to recent surgery, attention on follow-up recommended. Electronically Signed   By: Donavan Foil M.D.   On: 10/11/2018 18:53   Ct Angio Chest Aorta W/cm &/or Wo/cm  Result Date: 10/04/2018 CLINICAL DATA:  68 year old male with history of severe aortic stenosis pre preprocedural study prior to potential transcatheter aortic valve replacement (TAVR) procedure. EXAM: CT ANGIOGRAPHY CHEST, ABDOMEN AND PELVIS TECHNIQUE: Multidetector CT imaging through the chest, abdomen and pelvis was performed using the standard protocol during bolus administration of intravenous contrast. Multiplanar reconstructed images and MIPs were obtained and reviewed to evaluate the vascular anatomy. CONTRAST:  176mL OMNIPAQUE IOHEXOL 350 MG/ML SOLN COMPARISON:  None. FINDINGS: CTA CHEST FINDINGS Cardiovascular: Heart size is mildly enlarged with left atrial dilatation. There is no significant pericardial fluid, thickening or pericardial calcification. There is aortic atherosclerosis, as well as atherosclerosis of the great vessels of the mediastinum and the coronary arteries, including calcified atherosclerotic plaque in the left main, left  anterior descending, left circumflex and right coronary arteries. Severe thickening calcification of the aortic valve. Severe thickening calcification of the mitral valve and annulus. Ectasia of ascending thoracic aorta (4.0 cm in diameter). Mediastinum/Lymph Nodes:  No pathologically enlarged mediastinal or hilar lymph nodes. Surgical clips in the anterior mediastinum. Moderate sized hiatal hernia. No axillary lymphadenopathy. Lungs/Pleura: 8 x 6 mm (mean diameter of 7 mm) right upper lobe pulmonary nodule near the apex (axial image 17 of series 8). 4 mm pulmonary nodule in the right lower lobe (axial image 64 of series 8). No acute consolidative airspace disease. No pleural effusions. Musculoskeletal/Soft Tissues: Multiple chronic appearing compression fractures, most severe at T10 and T11 where there is approximately 40% loss of anterior vertebral body height. There are no aggressive appearing lytic or blastic lesions noted in the visualized portions of the skeleton. CTA ABDOMEN AND PELVIS FINDINGS Hepatobiliary: No suspicious cystic or solid hepatic lesions. No intra or extrahepatic biliary ductal dilatation. Gallbladder is normal in appearance. Pancreas: No pancreatic mass. No pancreatic ductal dilatation. No pancreatic or peripancreatic fluid collections or inflammatory changes. Spleen: Unremarkable. Adrenals/Urinary Tract: Bilateral kidneys and adrenal glands are normal in appearance. No hydroureteronephrosis. Urinary bladder is normal in appearance. Stomach/Bowel: Normal appearance of the intra-abdominal portion of the stomach. No pathologic dilatation of small bowel or colon. Normal appendix. Vascular/Lymphatic: Mild aortic atherosclerosis, without evidence of aneurysm or dissection in the abdominal or pelvic vasculature. Vascular findings and measurements pertinent to potential TAVR procedure, as detailed below. No lymphadenopathy noted in the abdomen or pelvis. Reproductive: Prostate gland and seminal vesicles are unremarkable in appearance. Other: No significant volume of ascites.  No pneumoperitoneum. Musculoskeletal: Chronic appearing compression fracture of L1 with 25% loss of anterior vertebral body height. There are no aggressive appearing lytic or blastic  lesions noted in the visualized portions of the skeleton. VASCULAR MEASUREMENTS PERTINENT TO TAVR: AORTA: Minimal Aortic Diameter-1.5 x 1.6 mm Severity of Aortic Calcification-mild RIGHT PELVIS: Right Common Iliac Artery - Minimal Diameter-9.8 x 9.7 mm Tortuosity-mild Calcification-none Right External Iliac Artery - Minimal Diameter-9.3 x 8.6 mm Tortuosity-moderate to severe Calcification - none Right Common Femoral Artery - Minimal Diameter-9.5 x 9.7 mm Tortuosity-mild Calcification - none LEFT PELVIS: Left Common Iliac Artery - Minimal Diameter-10.2 x 9.9 mm Tortuosity-mild Calcification - none Left External Iliac Artery - Minimal Diameter-10.1 x 9.1 mm Tortuosity-moderate to severe Calcification - none Left Common Femoral Artery - Minimal Diameter-8.8 x 8.8 mm Tortuosity-mild Calcification - none Review of the MIP images confirms the above findings. IMPRESSION: 1. Vascular findings and measurements pertinent to potential TAVR procedure, as detailed above. 2. Severe thickening calcification of the aortic valve, compatible with reported clinical history of severe aortic stenosis. 3. There is also severe thickening calcification of the mitral valve and annulus. 4. Aortic atherosclerosis, in addition to left main and 3 vessel coronary artery disease. There is also ectasia of the ascending thoracic aorta (4.0 cm in diameter). Recommend annual imaging followup by CTA or MRA. This recommendation follows 2010 ACCF/AHA/AATS/ACR/ASA/SCA/SCAI/SIR/STS/SVM Guidelines for the Diagnosis and Management of Patients with Thoracic Aortic Disease. Circulation. 2010; [id number]. Aortic aneurysm NOS (ICD10-I71.9). 5. Cardiomegaly with left atrial dilatation. 6. Small pulmonary nodules measuring 7 mm or less in size, as above. Non-contrast chest CT at 3-6 months is recommended. If the nodules are stable at time of repeat CT, then future CT at 18-24 months (from today's scan) is considered optional for low-risk patients, but is  recommended for high-risk patients. This recommendation follows the consensus statement:  Guidelines for Management of Incidental Pulmonary Nodules Detected on CT Images: From the Fleischner Society 2017; Radiology 2017; 284:228-243. 7. Additional incidental findings, as above. Electronically Signed   By: Vinnie Langton M.D.   On: 10/04/2018 16:49   Vas US Doppler Pre Cabg  Result Date: 10/09/2018 PREOPERATIVE VASCULAR EVALUATION  Indications:      Pre-CABG. Risk Factors:     Coronary artery disease. Comparison Study: No prior studies. Performing Technologist: Carlos Levering Rvt  Examination Guidelines: A complete evaluation includes B-mode imaging, spectral Doppler, color Doppler, and power Doppler as needed of all accessible portions of each vessel. Bilateral testing is considered an integral part of a complete examination. Limited examinations for reoccurring indications may be performed as noted.  Right Carotid Findings: +----------+--------+--------+--------+-----------------------+--------+             PSV cm/s EDV cm/s Stenosis Describe                Comments  +----------+--------+--------+--------+-----------------------+--------+  CCA Prox   109      7                 smooth and heterogenous           +----------+--------+--------+--------+-----------------------+--------+  CCA Distal 115      18                smooth and heterogenous           +----------+--------+--------+--------+-----------------------+--------+  ICA Prox   39       12                smooth and heterogenous           +----------+--------+--------+--------+-----------------------+--------+  ICA Distal 54       17                                        tortuous  +----------+--------+--------+--------+-----------------------+--------+  ECA        87       8                                                   +----------+--------+--------+--------+-----------------------+--------+ Portions of this table do not appear on this page.  +----------+--------+-------+--------+------------+             PSV cm/s EDV cms Describe Arm Pressure  +----------+--------+-------+--------+------------+  Subclavian 87                        108           +----------+--------+-------+--------+------------+ +---------+--------+--+--------+--+---------+  Vertebral PSV cm/s 56 EDV cm/s 14 Antegrade  +---------+--------+--+--------+--+---------+ Left Carotid Findings: +----------+--------+--------+--------+-----------------------+--------+             PSV cm/s EDV cm/s Stenosis Describe                Comments  +----------+--------+--------+--------+-----------------------+--------+  CCA Prox   122      17                smooth and heterogenous           +----------+--------+--------+--------+-----------------------+--------+  CCA Distal 98       19  smooth and heterogenous           +----------+--------+--------+--------+-----------------------+--------+  ICA Prox   61       10                smooth and heterogenous tortuous  +----------+--------+--------+--------+-----------------------+--------+  ICA Distal 60       15                                        tortuous  +----------+--------+--------+--------+-----------------------+--------+  ECA        77       8                                                   +----------+--------+--------+--------+-----------------------+--------+ +----------+--------+--------+--------+------------+  Subclavian PSV cm/s EDV cm/s Describe Arm Pressure  +----------+--------+--------+--------+------------+             159                        119           +----------+--------+--------+--------+------------+ +---------+--------+--+--------+--+---------+  Vertebral PSV cm/s 91 EDV cm/s 17 Antegrade  +---------+--------+--+--------+--+---------+  ABI Findings: +--------+------------------+-----+---------+--------+  Right    Rt Pressure (mmHg) Index Waveform  Comment    +--------+------------------+-----+---------+--------+  Brachial 108                      triphasic           +--------+------------------+-----+---------+--------+  PTA      149                1.25  triphasic           +--------+------------------+-----+---------+--------+  DP       141                1.18  triphasic           +--------+------------------+-----+---------+--------+ +--------+------------------+-----+---------+-------+  Left     Lt Pressure (mmHg) Index Waveform  Comment  +--------+------------------+-----+---------+-------+  Brachial 119                      triphasic          +--------+------------------+-----+---------+-------+  PTA      160                1.34  triphasic          +--------+------------------+-----+---------+-------+  DP       144                1.21  triphasic          +--------+------------------+-----+---------+-------+ +-------+---------------+----------------+  ABI/TBI Today's ABI/TBI Previous ABI/TBI  +-------+---------------+----------------+  Right   1.26                              +-------+---------------+----------------+  Left    1.36                              +-------+---------------+----------------+  Right Doppler Findings: +-----------+--------+-----+---------+-----------------------------------------+  Site        Pressure Index Doppler   Comments                                   +-----------+--------+-----+---------+-----------------------------------------+  Brachial    108            triphasic                                            +-----------+--------+-----+---------+-----------------------------------------+  Radial                     triphasic                                            +-----------+--------+-----+---------+-----------------------------------------+  Ulnar                      triphasic                                            +-----------+--------+-----+---------+-----------------------------------------+  Palmar Arch                           Palmar waveforms are obliterated with                                            radial and ulnar compression.              +-----------+--------+-----+---------+-----------------------------------------+  Left Doppler Findings: +-----------+--------+-----+---------+-----------------------------------------+  Site        Pressure Index Doppler   Comments                                   +-----------+--------+-----+---------+-----------------------------------------+  Brachial    119            triphasic                                            +-----------+--------+-----+---------+-----------------------------------------+  Radial                     triphasic                                            +-----------+--------+-----+---------+-----------------------------------------+  Ulnar                      triphasic                                            +-----------+--------+-----+---------+-----------------------------------------+  Palmar Arch                          Palmar waveforms are obliterated with  radial and ulnar compression.              +-----------+--------+-----+---------+-----------------------------------------+  Summary: Right Carotid: Velocities in the right ICA are consistent with a 1-39% stenosis. Left Carotid: Velocities in the left ICA are consistent with a 1-39% stenosis. Vertebrals: Bilateral vertebral arteries demonstrate antegrade flow. Right ABI: Resting right ankle-brachial index is within normal range. No evidence of significant right lower extremity arterial disease. Left ABI: Resting left ankle-brachial index indicates noncompressible left lower extremity arteries.   Electronically signed by Monica Martinez MD on 10/09/2018 at 42:08:47 PM.    Final    Ct Angio Abd/pel W/ And/or W/o  Result Date: 10/04/2018 CLINICAL DATA:  68 year old male with history of severe aortic stenosis pre preprocedural study prior to  potential transcatheter aortic valve replacement (TAVR) procedure. EXAM: CT ANGIOGRAPHY CHEST, ABDOMEN AND PELVIS TECHNIQUE: Multidetector CT imaging through the chest, abdomen and pelvis was performed using the standard protocol during bolus administration of intravenous contrast. Multiplanar reconstructed images and MIPs were obtained and reviewed to evaluate the vascular anatomy. CONTRAST:  151mL OMNIPAQUE IOHEXOL 350 MG/ML SOLN COMPARISON:  None. FINDINGS: CTA CHEST FINDINGS Cardiovascular: Heart size is mildly enlarged with left atrial dilatation. There is no significant pericardial fluid, thickening or pericardial calcification. There is aortic atherosclerosis, as well as atherosclerosis of the great vessels of the mediastinum and the coronary arteries, including calcified atherosclerotic plaque in the left main, left anterior descending, left circumflex and right coronary arteries. Severe thickening calcification of the aortic valve. Severe thickening calcification of the mitral valve and annulus. Ectasia of ascending thoracic aorta (4.0 cm in diameter). Mediastinum/Lymph Nodes: No pathologically enlarged mediastinal or hilar lymph nodes. Surgical clips in the anterior mediastinum. Moderate sized hiatal hernia. No axillary lymphadenopathy. Lungs/Pleura: 8 x 6 mm (mean diameter of 7 mm) right upper lobe pulmonary nodule near the apex (axial image 17 of series 8). 4 mm pulmonary nodule in the right lower lobe (axial image 64 of series 8). No acute consolidative airspace disease. No pleural effusions. Musculoskeletal/Soft Tissues: Multiple chronic appearing compression fractures, most severe at T10 and T11 where there is approximately 40% loss of anterior vertebral body height. There are no aggressive appearing lytic or blastic lesions noted in the visualized portions of the skeleton. CTA ABDOMEN AND PELVIS FINDINGS Hepatobiliary: No suspicious cystic or solid hepatic lesions. No intra or extrahepatic biliary  ductal dilatation. Gallbladder is normal in appearance. Pancreas: No pancreatic mass. No pancreatic ductal dilatation. No pancreatic or peripancreatic fluid collections or inflammatory changes. Spleen: Unremarkable. Adrenals/Urinary Tract: Bilateral kidneys and adrenal glands are normal in appearance. No hydroureteronephrosis. Urinary bladder is normal in appearance. Stomach/Bowel: Normal appearance of the intra-abdominal portion of the stomach. No pathologic dilatation of small bowel or colon. Normal appendix. Vascular/Lymphatic: Mild aortic atherosclerosis, without evidence of aneurysm or dissection in the abdominal or pelvic vasculature. Vascular findings and measurements pertinent to potential TAVR procedure, as detailed below. No lymphadenopathy noted in the abdomen or pelvis. Reproductive: Prostate gland and seminal vesicles are unremarkable in appearance. Other: No significant volume of ascites.  No pneumoperitoneum. Musculoskeletal: Chronic appearing compression fracture of L1 with 25% loss of anterior vertebral body height. There are no aggressive appearing lytic or blastic lesions noted in the visualized portions of the skeleton. VASCULAR MEASUREMENTS PERTINENT TO TAVR: AORTA: Minimal Aortic Diameter-1.5 x 1.6 mm Severity of Aortic Calcification-mild RIGHT PELVIS: Right Common Iliac Artery - Minimal Diameter-9.8 x 9.7 mm Tortuosity-mild Calcification-none Right External Iliac Artery - Minimal Diameter-9.3 x 8.6 mm Tortuosity-moderate  to severe Calcification - none Right Common Femoral Artery - Minimal Diameter-9.5 x 9.7 mm Tortuosity-mild Calcification - none LEFT PELVIS: Left Common Iliac Artery - Minimal Diameter-10.2 x 9.9 mm Tortuosity-mild Calcification - none Left External Iliac Artery - Minimal Diameter-10.1 x 9.1 mm Tortuosity-moderate to severe Calcification - none Left Common Femoral Artery - Minimal Diameter-8.8 x 8.8 mm Tortuosity-mild Calcification - none Review of the MIP images confirms the  above findings. IMPRESSION: 1. Vascular findings and measurements pertinent to potential TAVR procedure, as detailed above. 2. Severe thickening calcification of the aortic valve, compatible with reported clinical history of severe aortic stenosis. 3. There is also severe thickening calcification of the mitral valve and annulus. 4. Aortic atherosclerosis, in addition to left main and 3 vessel coronary artery disease. There is also ectasia of the ascending thoracic aorta (4.0 cm in diameter). Recommend annual imaging followup by CTA or MRA. This recommendation follows 2010 ACCF/AHA/AATS/ACR/ASA/SCA/SCAI/SIR/STS/SVM Guidelines for the Diagnosis and Management of Patients with Thoracic Aortic Disease. Circulation. 2010; 121ML:4928372. Aortic aneurysm NOS (ICD10-I71.9). 5. Cardiomegaly with left atrial dilatation. 6. Small pulmonary nodules measuring 7 mm or less in size, as above. Non-contrast chest CT at 3-6 months is recommended. If the nodules are stable at time of repeat CT, then future CT at 18-24 months (from today's scan) is considered optional for low-risk patients, but is recommended for high-risk patients. This recommendation follows the consensus statement: Guidelines for Management of Incidental Pulmonary Nodules Detected on CT Images: From the Fleischner Society 2017; Radiology 2017; 284:228-243. 7. Additional incidental findings, as above. Electronically Signed   By: Vinnie Langton M.D.   On: 10/04/2018 16:49    EKG: 10/12/18 shows junctional rhythm at 50 bpm with complete heart block (personally reviewed)  New compared to 09/15/2018 with NSR 75 bpm, QRS 140, PR 182)  TELEMETRY: ?A-sensed + V-paced at 80, appears AP-VP at times, underlying rhythm ~ 50 bpm noted at 0852 with testing (personally reviewed)  Assessment/Plan: 1.  New CHB s/p AVR, MVR, and CABG Noted junctional intrinsic rhythm this am with CHB under pacers.  No AV nodal blockade on board. We will continue to monitor, and if  conduction does not return, will plan PPM early next week.   2. S/p AVR, MVR, CABG Relatively stable post op course apart from CHB as above.   For questions or updates, please contact [place] Please consult [email or web address] for contact info under Cardiology/STEMI.  Signed, Shirley Friar, PA-C  10/12/2018 9:54 AM  I have seen, examined the patient, and reviewed the above assessment and plan.  Changes to above are made where necessary.  On exam, RRR.  The patient has persistent AV block.  On discussion with Dr Roxy Manns, there is a high likelihood that he will require pacing early next week.  EP to see as needed over the weekend.  Co Sign: Thompson Grayer, MD 10/13/2018 3:48 PM

## 2018-10-12 NOTE — Discharge Summary (Signed)
Physician Discharge Summary  Patient ID: [name] MRN: [id number] DOB/AGE: 10-06-1950 68 y.o.  Admit date: 10/11/2018 Discharge date: 10/20/2018  Admission Diagnoses:  Patient Active Problem List   Diagnosis Date Noted  . S/P patent foramen ovale closure 10/11/2018  . Incidental pulmonary nodule, > 80m and < 867m08/26/2020  . Severe aortic stenosis   . Severe mitral valve stenosis   . Coronary artery disease involving native coronary artery of native heart without angina pectoris   . Mixed hyperlipidemia 06/15/2017  . Hypogonadism in male 11/05/2015  . Mitral stenosis 03/18/2014  . Current occasional smoker 12/27/2011  . Obesity (BMI 30-39.9) 12/27/2011  . Aortic stenosis 12/27/2011  . History of esophageal stricture 12/27/2011   Discharge Diagnoses:   Patient Active Problem List   Diagnosis Date Noted  . S/P aortic + mitral valve replacement with bioprosthetic valves + CABG x2 10/11/2018  . S/P mitral valve replacement with bioprosthetic valve 10/11/2018  . S/P CABG x 2 10/11/2018  . S/P patent foramen ovale closure 10/11/2018  . Incidental pulmonary nodule, > 81m76mnd < 8mm76m/26/2020  . Severe aortic stenosis   . Severe mitral valve stenosis   . Coronary artery disease involving native coronary artery of native heart without angina pectoris   . Mixed hyperlipidemia 06/15/2017  . Hypogonadism in male 11/05/2015  . Mitral stenosis 03/18/2014  . Current occasional smoker 12/27/2011  . Obesity (BMI 30-39.9) 12/27/2011  . Aortic stenosis 12/27/2011  . History of esophageal stricture 12/27/2011  Post operative complete heart block Post-operative atrial flutter with controlled ventricular response Expected acute blood loss anemia  Discharged Condition: good  History of Present Illness:  [name] 68 y23r old moderately obese male with history of aortic stenosis, mitral stenosis, hyperlipidemia, previous thymectomy with radiation therapy to the chest, and  longstanding tobacco abuse who has been referred for surgical consultation to discuss treatment options for management of severe symptomatic aortic stenosis, mitral stenosis, and multivessel coronary artery disease.  Patient states that he was first noted to have a heart murmur on physical exam 4 or 5 years ago. Echocardiograms have documented the presence of aortic stenosis and mitral stenosis with normal left ventricular systolic function. The patient has been followed for the last several years by Dr. McAlAngelena Form recent follow-up transthoracic echocardiogram performed Jun 27, 2018 revealed significant progression of disease including severe aortic stenosis and severe mitral stenosis. Left ventricular systolic function remains normal with ejection fraction estimated greater than 65%. Peak velocity across the aortic valve measured 4.2 m/s corresponding to mean transvalvular gradient estimated 45 mmHg. Mean gradient across mitral valve with estimated 13 mmHg corresponding to mitral valve area 1.1 cm using pressure half-time. Left and right heart catheterization was performed September 15, 2018 and confirmed the presence of severe aortic stenosis with peak to peak and mean transvalvular gradients measured 32 and 34 mmHg, respectively. There was severe multivessel coronary artery disease with long segment proximal and mid stenosis of the left anterior descending coronary artery and 100% chronic occlusion of the right coronary artery. There was no significant flow-limiting disease in the left circumflex territory. Right heart pressures were moderately elevated. Cardiothoracic surgical consultation was requested.  He was evaluated by Dr. OwenRoxy Mannst that time he admitted to living a sedentary lifestyle.  He denies symptoms accept for exertional shortness of breath.  This occurs with more strenuous exertion.  It was felt the patient would best be treated with surgical replacement of his aortic and mitral  valves.  He would also require coronary bypass grafting procedure.  The risks and benefits of the procedure were explained to the patient and he was agreeable to proceed.  Hospital Course:   Mr. Millea presented to [facility] on 10/11/2018.  He was taken to the operating room and underwent CABG x 2 utilizing LIMA to LAD, SVG to RCA, Aortic valve replacement, Mitral valve replacement, Closure of Patent Foramen Ovale, and Clipping of LA appendage, and endoscopic harvest of greater saphenous vein from his right leg.  He tolerated the procedure without difficulty and was taken to the SICU in stable condition.  He was extubated the evening of surgery.  During his stay in the SICU the patient was weaned off Milrinone and Neo-synephrine as tolerated.  He was in complete heart block under his temporary pacemaker.  He did have a junctional escape rhythm.  Due to this,  EP consult was obtained.  He was started on coumadin for his prosthetic valves.  The patient's chest tube output decreased.  They were removed on 10/15/2018.  He was treated with aggressive IV diuretics for volume overload status.  His complete heart block transitioned to Atrial Flutter, but his heart rate would drop into the 40s-50s with a decrease in BP. Rapid Atrial Pacing was attempted but was unsuccessful.  EP service has followed the patient and  felt PPM would be required.  This was performed on 10/17/2018.  He tolerated the procedure without difficulty. He remained in Atrial flutter post pacemaker placement with elevated heart rate at times.  He was started on a beta blocker for this.  He had issues with mild confusion and his narcotic pain medication was discontinued.  His pacing wires were removed without difficulty.  He was unsteady with ambulation.  Physical therapy consult was obtained and recommended home PT and a rolling walker at discharge.  Home health arrangements have been made.  He remains on coumadin with most recent INR being 1.8.   He recseveral doses of coumadin 46m followed by a single dose of Coumadin 7.5100mon the evening prior to discharge. He will remain on 5 mg of coumadin daily with a goal INR of 2.5. He will follow up withj the [facility] in 3 days.  His incisions have no evidence of infection.  He does have a mild dehiscence of his right EVH site.  He is medically stable for discharge home today.  Consults: EP  Significant Diagnostic Studies: cardiac graphics:   Echocardiogram:  1. The left ventricle has hyperdynamic systolic function, with an ejection fraction of >65%. The cavity size was normal. There is moderately increased left ventricular wall thickness. Left ventricular diastolic Doppler parameters are indeterminate.  2. The right ventricle has normal systolic function. The cavity was normal.  3. Left atrial size was severely dilated.  4. The mitral valve is abnormal. There is severe mitral annular calcification present. Severe mitral valve stenosis.  5. The tricuspid valve is grossly normal.  6. There is mild dilatation of the ascending aorta measuring 38 mm.  7. Vigorous LV systolic function; moderate LVH; mildly dilated ascending aorta; heavily calcified aortic valve with severe AS (mean gradient 45 mmHg) and at least moderate AI (difficult to quantitate due to contamination from mitral inflow); severe MAC  with severe MS (mean gradient 13 mmHg; MVA by pressure halftime 1.1 cm2); severe LAE.  Angiography:  Prox RCA lesion is 100% stenosed.  Prox Cx to Mid Cx lesion is 30% stenosed.  1st Mrg lesion  is 30% stenosed.  3rd Mrg lesion is 30% stenosed.  Prox LAD lesion is 40% stenosed.  Mid LAD lesion is 95% stenosed.   1. Severe stenosis mid LAD 2. Mild non-obstructive disease in the LAD 3. Chronic occlusion of the proximal RCA. Filling of the proximal, mid and distal RCA from left to right collaterals 4. Severe aortic stenosis (mean gradient 34.1 mmHg, peak to peak gradient 32 mmHg, AVA 1.1  cm2) 5. Severe mitral stenosis by echo.   Treatments: surgery:   Procedure:        Aortic Valve Replacement             Edwards Inspiris Resilia Stented Bovine Pericardial Tissue Valve (size 35m, ref # [id number], serial # [id number]               Mitral Valve Replacement             Medtronic Mosaic Stented Porcine Tissue Valve (size 226m model # 310, serial # [id number]  Coronary Artery Bypass Grafting x 2             Left Internal Mammary Artery to Distal Left Anterior Descending Coronary Artery             Saphenous Vein Graft to Distal Right Coronary Artery             Endoscopic Vein Harvest from Right Thigh    Closure of Patent Foramen Ovale               Clipping of Left Atrial Appendage             Atricure Pro245 left atrial clip (size 456m  Placement of Femoral Arterial Line  Discharge Exam: Blood pressure (!) 141/67, pulse 77, temperature 98.5 F (36.9 C), temperature source Oral, resp. rate 20, height _0  (1.753 m), weight 107.9 kg, SpO2 95 %.  Physical Exam: General appearance: alert, cooperative and no distress Neurologic: intact Heart: monitor shows a-flutter with occasional PVC's and occasional paced beats.  Lungs: Breath sounds are clear.  Extremities: trace peripheral edema, right thigh incision intact and healing Wound: the sternotomy incision is healing with no sign of complication.   Discharge Medications:  The patient has been discharged on:   1.Beta Blocker:  Yes [ X  ]                              No   [   ]                              If No, reason:   2.Ace Inhibitor/ARB: Yes [ X  ]                                     No  [    ]                                     If No, reason:  3.Statin:   Yes [ x  ]                  No  [   ]  If No, reason:  4.Ecasa:  Yes  [ X ]                  No   [   ]                  If No, reason:     Allergies as of 10/20/2018      Reactions   Tylenol [acetaminophen] Nausea  Only      Medication List    STOP taking these medications   ibuprofen 200 MG tablet Commonly known as: ADVIL     TAKE these medications   amLODipine 5 MG tablet Commonly known as: NORVASC Take 1 tablet (5 mg total) by mouth daily. Start taking on: October 21, 2018   aspirin 81 MG tablet Take 81 mg by mouth every other day.   atorvastatin 20 MG tablet Commonly known as: LIPITOR Take 1 tablet (20 mg total) by mouth daily at 6 PM.   furosemide 40 MG tablet Commonly known as: LASIX Take 1 tablet (40 mg total) by mouth daily. Start taking on: October 21, 2018   influenza vaccine adjuvanted 0.5 ML injection Commonly known as: FLUAD Inject 0.5 mLs into the muscle tomorrow at 10 am for 1 dose.   lisinopril 10 MG tablet Commonly known as: ZESTRIL Take 1 tablet (10 mg total) by mouth daily. Start taking on: October 21, 2018   metoprolol tartrate 25 MG tablet Commonly known as: LOPRESSOR Take 1 tablet (25 mg total) by mouth 2 (two) times daily.   nicotine polacrilex 2 MG lozenge Commonly known as: COMMIT Take 2 mg by mouth as needed for smoking cessation.   oxymetazoline 0.05 % nasal spray Commonly known as: AFRIN Place 1 spray into both nostrils at bedtime as needed for congestion.   potassium chloride SA 20 MEQ tablet Commonly known as: K-DUR Take 1 tablet (20 mEq total) by mouth daily. Start taking on: October 21, 2018   traMADol 50 MG tablet Commonly known as: ULTRAM Take 1 tablet (50 mg total) by mouth every 4 (four) hours as needed for up to 7 days for moderate pain.   warfarin 2.5 MG tablet Commonly known as: Coumadin Take 2 tablets (5 mg total) by mouth daily. or as directed by the [facility]  (From admission, onward)         Start     Ordered   10/20/18 1042  For home use only DME Walker rolling  Once    Question:  Patient needs a walker to treat with the following condition  Answer:  Unsteady  gait   10/20/18 1046         Follow-up Information    [facility] Follow up on 11/13/2018.   Specialty: Cardiothoracic Surgery Why: Appointment is at 1:00, please get CXR at 12:30 at [place] located on first floor of our office building Contact information: [place], [place]       Thompson Grayer, MD Follow up on 01/18/2019.   Specialty: Cardiology Why: at 300 pm for 3 month pacemaker check Contact information: [address] [place] [address] [place] Follow up on 10/31/2018.   Why: at 1130 for post pacemaker check Contact information: [place]  [place] [address] [phone]       [facility] Follow up on 10/23/2018.   Specialty: Cardiology Why: Appointment is at 2:30pm for a blood test at the [facility]. Contact information: [address], [address] [place]       Charlie Pitter, PA-C Follow up on 11/02/2018.   Specialties: Cardiology, Radiology Why: Appointment is at 10:45 Contact information: [address] [place] [address] [phone]           Signed:  Ellwood Handler PA-C  Last modified by: Enid Cutter, PA-C 10/20/2018, 10:48 AM

## 2018-10-12 NOTE — Progress Notes (Signed)
      [address]       [place] [address]             [phone]      POD # 1 AVR, MVR, CABG x 2  Up in chair  BP 123/76   Pulse 80   Temp (!) 97.5 F (36.4 C) (Oral)   Resp (!) 21   Ht 5\' 9"  (1.753 m)   Wt 113.1 kg   SpO2 96%   BMI 36.82 kg/m  DDD paced at 80 2L [place]- 96% sat Milrinone at 0.2 mcg/kg/min CBG well controlled PM labs pending  Remo Lipps C. Roxan Hockey, MD [facility] [phone]

## 2018-10-12 NOTE — Procedures (Signed)
Extubation Procedure Note  Patient Details:   Name: [name] DOB: 1950-05-31 MRN: [id number]   Airway Documentation:    Vent end date: 10/12/18 Vent end time: 0155   Evaluation  O2 sats: stable throughout Complications: No apparent complications Patient did tolerate procedure well. Bilateral Breath Sounds: Clear, Diminished   Yes   Pt tolerated SICU heart wean, achieved around 94mL (VC), -20 (NIF) and positive for cuff leak. Pt extubated to 5L [place]. RT will continue to monitor.   Mariam Dollar 10/12/2018, 1:58 AM

## 2018-10-12 NOTE — Progress Notes (Signed)
[address]       [place] [address]             [phone]        CARDIOTHORACIC SURGERY PROGRESS NOTE   R1 Day Post-Op Procedure(s) (LRB): AORTIC VALVE REPLACEMENT (AVR) with 23 Inspiris Bioprosthetic Aortic valve. (N/A) MITRAL VALVE (MV) REPLACEMENT with 27 Mosaic Bioprosthetic Mitral valve. (N/A) CORONARY ARTERY BYPASS GRAFTING (CABG) x 2 using LIMA to the LAD and endoscopic greater saphenous vein harvesting to the RCA. (N/A) TRANSESOPHAGEAL ECHOCARDIOGRAM (TEE) (N/A) CLIPPING OF LEFT ATRIAL APPENDAGE with 45 AtriCure Clip. (N/A) CLOSURE OF PATENT FORAMEN OVALE (N/A)  Subjective: Looks good and feels well.  Mild soreness in chest.  No SOB.  No nausea.  Objective: Vital signs: BP Readings from Last 1 Encounters:  10/12/18 108/66   Pulse Readings from Last 1 Encounters:  10/12/18 80   Resp Readings from Last 1 Encounters:  10/12/18 18   Temp Readings from Last 1 Encounters:  10/12/18 98 F (36.7 C) (Oral)    Hemodynamics: PAP: (24-41)/(11-23) 26/11 CO:  [4.3 L/min-5.2 L/min] 4.3 L/min CI:  [2 L/min/m2-2.1 L/min/m2] 2 L/min/m2  Physical Exam:  Rhythm:   Sinus w/ CHB - DDD pacing although not A-sensing well  Breath sounds: clear  Heart sounds:  RRR w/out murmur  Incisions:  Dressings dry, intact  Abdomen:  Soft, non-distended, non-tender  Extremities:  Warm, well-perfused  Chest tubes:  decreasing volume thin serosanguinous output, no air leak    Intake/Output from previous day: 09/02 0701 - 09/03 0700 In: 9158.5 [I.V.:5111.5; Blood:2183.3; IV Piggyback:1863.7] Out: 3775 [Urine:1810; Blood:600; Chest Tube:1365] Intake/Output this shift: Total I/O In: 61.9 [I.V.:61.9] Out: 70 [Urine:50; Chest Tube:20]  Lab Results:  CBC: Recent Labs    10/11/18 2330  10/12/18 0422 10/12/18 0500  WBC 13.3*  --  11.0*  --   HGB 7.3*   < > 8.3* 7.8*  HCT 22.5*   < > 25.8* 23.0*  PLT 178  --  141*  --    < > = values in this interval not  displayed.    BMET:  Recent Labs    10/11/18 2330  10/12/18 0422 10/12/18 0500  NA 140   < > 140 142  K 4.3   < > 4.4 4.3  CL 113*  --  111  --   CO2 19*  --  19*  --   GLUCOSE 122*  --  140*  --   BUN 13  --  12  --   CREATININE 0.98  --  0.94  --   CALCIUM 7.7*  --  7.9*  --    < > = values in this interval not displayed.     PT/INR:   Recent Labs    10/11/18 1822  LABPROT 19.2*  INR 1.6*    CBG (last 3)  Recent Labs    10/12/18 0603 10/12/18 0701 10/12/18 0742  GLUCAP 117* 129* 123*    ABG    Component Value Date/Time   PHART 7.349 (L) 10/12/2018 0500   PCO2ART 35.3 10/12/2018 0500   PO2ART 97.0 10/12/2018 0500   HCO3 19.4 (L) 10/12/2018 0500   TCO2 20 (L) 10/12/2018 0500   ACIDBASEDEF 6.0 (H) 10/12/2018 0500   O2SAT 97.0 10/12/2018 0500    CXR: PORTABLE CHEST 1 VIEW  COMPARISON:  Radiograph of October 11, 2018.  FINDINGS: Stable cardiomegaly. Endotracheal and nasogastric tubes have been removed. Right internal jugular Swan-Ganz catheter is  unchanged. Stable bilateral chest tubes are noted. No pneumothorax is noted. Mild bibasilar subsegmental atelectasis is noted. Bony thorax is unremarkable.  IMPRESSION: Endotracheal and nasogastric tubes have been removed. Bilateral chest tubes are noted without pneumothorax. Mild bibasilar subsegmental atelectasis is noted.   Electronically Signed   By: Marijo Conception M.D.   On: 10/12/2018 08:31   EKG: NSR w/ complete heart block (new)   Assessment/Plan: S/P Procedure(s) (LRB): AORTIC VALVE REPLACEMENT (AVR) with 23 Inspiris Bioprosthetic Aortic valve. (N/A) MITRAL VALVE (MV) REPLACEMENT with 27 Mosaic Bioprosthetic Mitral valve. (N/A) CORONARY ARTERY BYPASS GRAFTING (CABG) x 2 using LIMA to the LAD and endoscopic greater saphenous vein harvesting to the RCA. (N/A) TRANSESOPHAGEAL ECHOCARDIOGRAM (TEE) (N/A) CLIPPING OF LEFT ATRIAL APPENDAGE with 45 AtriCure Clip. (N/A) CLOSURE OF PATENT  FORAMEN OVALE (N/A)  Doing well POD1 DDD pacing w/ stable hemodynamics on low dose milrinone and Neo  CHB under pacer w/ wide QRS Breathing comfortably w/ O2 sats 97% on 4 L/min via [place], CXR looks good Expected post op atelectasis, mild Expected post op acute blood loss anemia, Hgb 8.3 this morning Acute on chronic diastolic CHF with expected post-op volume excess, weight reportedly 8 kg > preop Post op thrombocytopenia, mild Pulmonary hypertension - much improved postop   Mobilize  D/C lines  Leave chest tubes in place at least 1 more day - monitor for air leak and output  Wean drips slowly  Start diuretics once BP stable off Neo drip  Watch anemia  Start Coumadin  Consult EP service - I expect he will likely need PPM   Rexene Alberts, MD 10/12/2018 8:58 AM

## 2018-10-13 ENCOUNTER — Inpatient Hospital Stay (HOSPITAL_COMMUNITY): Payer: Medicare HMO

## 2018-10-13 LAB — CBC
HCT: 26.4 % — ABNORMAL LOW (ref 39.0–52.0)
Hemoglobin: 8.6 g/dL — ABNORMAL LOW (ref 13.0–17.0)
MCH: 28.8 pg (ref 26.0–34.0)
MCHC: 32.6 g/dL (ref 30.0–36.0)
MCV: 88.3 fL (ref 80.0–100.0)
Platelets: 115 10*3/uL — ABNORMAL LOW (ref 150–400)
RBC: 2.99 MIL/uL — ABNORMAL LOW (ref 4.22–5.81)
RDW: 15.9 % — ABNORMAL HIGH (ref 11.5–15.5)
WBC: 16.9 10*3/uL — ABNORMAL HIGH (ref 4.0–10.5)
nRBC: 0 % (ref 0.0–0.2)

## 2018-10-13 LAB — GLUCOSE, CAPILLARY
Glucose-Capillary: 155 mg/dL — ABNORMAL HIGH (ref 70–99)
Glucose-Capillary: 163 mg/dL — ABNORMAL HIGH (ref 70–99)
Glucose-Capillary: 167 mg/dL — ABNORMAL HIGH (ref 70–99)
Glucose-Capillary: 174 mg/dL — ABNORMAL HIGH (ref 70–99)
Glucose-Capillary: 193 mg/dL — ABNORMAL HIGH (ref 70–99)
Glucose-Capillary: 196 mg/dL — ABNORMAL HIGH (ref 70–99)

## 2018-10-13 LAB — BASIC METABOLIC PANEL
Anion gap: 9 (ref 5–15)
BUN: 21 mg/dL (ref 8–23)
CO2: 20 mmol/L — ABNORMAL LOW (ref 22–32)
Calcium: 8 mg/dL — ABNORMAL LOW (ref 8.9–10.3)
Chloride: 107 mmol/L (ref 98–111)
Creatinine, Ser: 1.13 mg/dL (ref 0.61–1.24)
GFR calc Af Amer: >60 mL/min (ref >60)
GFR calc non Af Amer: >60 mL/min (ref >60)
Glucose, Bld: 169 mg/dL — ABNORMAL HIGH (ref 70–99)
Potassium: 4.4 mmol/L (ref 3.5–5.1)
Sodium: 136 mmol/L (ref 135–145)

## 2018-10-13 LAB — PROTIME-INR
INR: 1.5 — ABNORMAL HIGH (ref 0.8–1.2)
Prothrombin Time: 17.9 seconds — ABNORMAL HIGH (ref 11.4–15.2)

## 2018-10-13 LAB — MAGNESIUM: Magnesium: 2.5 mg/dL — ABNORMAL HIGH (ref 1.7–2.4)

## 2018-10-13 MED ORDER — INSULIN ASPART 100 UNIT/ML ~~LOC~~ SOLN
0.0000 [IU] | SUBCUTANEOUS | Status: DC
  Administered 2018-10-13: 16:00:00 4 [IU] via SUBCUTANEOUS
  Administered 2018-10-13 – 2018-10-14 (×3): 2 [IU] via SUBCUTANEOUS

## 2018-10-13 MED ORDER — SODIUM CHLORIDE 0.9% FLUSH
10.0000 mL | Freq: Two times a day (BID) | INTRAVENOUS | Status: DC
  Administered 2018-10-13 – 2018-10-17 (×8): 10 mL

## 2018-10-13 MED ORDER — WARFARIN SODIUM 2.5 MG PO TABS: 2.5000 mg | ORAL_TABLET | Freq: Every day | ORAL | Status: DC

## 2018-10-13 MED ORDER — SODIUM CHLORIDE 0.9% FLUSH: 10.0000 mL | INTRAVENOUS | Status: DC | PRN

## 2018-10-13 MED ORDER — COUMADIN BOOK
Freq: Once | Status: AC
  Administered 2018-10-13: 14:00:00
  Filled 2018-10-13: qty 1

## 2018-10-13 MED ORDER — MOVING RIGHT ALONG BOOK
Freq: Once | Status: AC
  Administered 2018-10-13: 15:00:00
  Filled 2018-10-13: qty 1

## 2018-10-13 NOTE — Discharge Instructions (Addendum)
Discharge Instructions:  1. You may shower, please wash incisions daily with soap and water and keep dry.  If you wish to cover wounds with dressing you may do so but please keep clean and change daily.  No tub baths or swimming until incisions have completely healed.  If your incisions become red or develop any drainage please call our office at [phone]  2. No Driving until cleared by Dr. Guy Sandifer office and you are no longer using narcotic pain medications  3. Monitor your weight daily.. Please use the same scale and weigh at same time... If you gain 5-10 lbs in 48 hours with associated lower extremity swelling, please contact our office at [phone]  4. Fever of 101.5 for at least 24 hours with no source, please contact our office at [phone]  5. Activity- up as tolerated, please walk at least 3 times per day.  Avoid strenuous activity, no lifting, pushing, or pulling with your arms over 8-10 lbs for a minimum of 6 weeks  6. If any questions or concerns arise, please do not hesitate to contact our office at [phone]  Information on my medicine - Coumadin   (Warfarin)  This medication education was reviewed with me or my healthcare representative as part of my discharge preparation.  The pharmacist that spoke with me during my hospital stay was:  Werner Lean, [facility]  Why was Coumadin prescribed for you? Coumadin was prescribed for you because you have a blood clot or a medical condition that can cause an increased risk of forming blood clots. Blood clots can cause serious health problems by blocking the flow of blood to the heart, lung, or brain. Coumadin can prevent harmful blood clots from forming. As a reminder your indication for Coumadin is:   Blood Clot Prevention After Heart Valve Surgery  What test will check on my response to Coumadin? While on Coumadin (warfarin) you will need to have an INR test regularly to ensure that your dose is keeping you in the desired range.  The INR (international normalized ratio) number is calculated from the result of the laboratory test called prothrombin time (PT).  If an INR APPOINTMENT HAS NOT ALREADY BEEN MADE FOR YOU please schedule an appointment to have this lab work done by your health care provider within 7 days. Your INR goal is usually a number between:  2 to 3 or your provider may give you a more narrow range like 2-2.5.  Ask your health care provider during an office visit what your goal INR is.  What  do you need to  know  About  COUMADIN? Take Coumadin (warfarin) exactly as prescribed by your healthcare provider about the same time each day.  DO NOT stop taking without talking to the doctor who prescribed the medication.  Stopping without other blood clot prevention medication to take the place of Coumadin may increase your risk of developing a new clot or stroke.  Get refills before you run out.  What do you do if you miss a dose? If you miss a dose, take it as soon as you remember on the same day then continue your regularly scheduled regimen the next day.  Do not take two doses of Coumadin at the same time.  Important Safety Information A possible side effect of Coumadin (Warfarin) is an increased risk of bleeding. You should call your healthcare provider right away if you experience any of the following: ? Bleeding from an injury or your nose that does  not stop. ? Unusual colored urine (red or dark brown) or unusual colored stools (red or black). ? Unusual bruising for unknown reasons. ? A serious fall or if you hit your head (even if there is no bleeding).  Some foods or medicines interact with Coumadin (warfarin) and might alter your response to warfarin. To help avoid this: ? Eat a balanced diet, maintaining a consistent amount of Vitamin K. ? Notify your provider about major diet changes you plan to make. ? Avoid alcohol or limit your intake to 1 drink for women and 2 drinks for men per day. (1 drink is  5 oz. wine, 12 oz. beer, or 1.5 oz. liquor.)  Make sure that ANY health care provider who prescribes medication for you knows that you are taking Coumadin (warfarin).  Also make sure the healthcare provider who is monitoring your Coumadin knows when you have started a new medication including herbals and non-prescription products.  Coumadin (Warfarin)  Major Drug Interactions  Increased Warfarin Effect Decreased Warfarin Effect  Alcohol (large quantities) Antibiotics (esp. Septra/Bactrim, Flagyl, Cipro) Amiodarone (Cordarone) Aspirin (ASA) Cimetidine (Tagamet) Megestrol (Megace) NSAIDs (ibuprofen, naproxen, etc.) Piroxicam (Feldene) Propafenone (Rythmol SR) Propranolol (Inderal) Isoniazid (INH) Posaconazole (Noxafil) Barbiturates (Phenobarbital) Carbamazepine (Tegretol) Chlordiazepoxide (Librium) Cholestyramine (Questran) Griseofulvin Oral Contraceptives Rifampin Sucralfate (Carafate) Vitamin K   Coumadin (Warfarin) Major Herbal Interactions  Increased Warfarin Effect Decreased Warfarin Effect  Garlic Ginseng Ginkgo biloba Coenzyme Q10 Green tea St. Johns wort    Coumadin (Warfarin) FOOD Interactions  Eat a consistent number of servings per week of foods HIGH in Vitamin K (1 serving =  cup)  Collards (cooked, or boiled & drained) Kale (cooked, or boiled & drained) Mustard greens (cooked, or boiled & drained) Parsley *serving size only =  cup Spinach (cooked, or boiled & drained) Swiss chard (cooked, or boiled & drained) Turnip greens (cooked, or boiled & drained)  Eat a consistent number of servings per week of foods MEDIUM-HIGH in Vitamin K (1 serving = 1 cup)  Asparagus (cooked, or boiled & drained) Broccoli (cooked, boiled & drained, or raw & chopped) Brussel sprouts (cooked, or boiled & drained) *serving size only =  cup Lettuce, raw (green leaf, endive, romaine) Spinach, raw Turnip greens, raw & chopped   These websites have more information on  Coumadin (warfarin):  FailFactory.se; VeganReport.com.au;  THE BELOW INSTRUCTIONS ARE STRICTLY FOR YOUR PACEMAKER. If sternal precautions and pacemaker precautions conflict, please follow the most strict/most conservative instructions for the recommended time.   After Your Pacemaker   You have a Medtronic Pacemaker   Do not lift your arm above shoulder height for 1 week after your procedure. After 7 days, you may progress as below.     Tuesday October 24, 2018  Wednesday October 25, 2018 Thursday October 26, 2018 Friday October 27, 2018    Do not lift, push, pull, or carry anything over 10 pounds with the affected arm until 6 weeks (Tuesday November 28, 2018) after your procedure.    Do not drive until Dr. Roxy Manns says that you are safe to do so.    Monitor your pacemaker site for redness, swelling, and drainage. Call the device clinic at [phone] if you experience these symptoms or fever/chills.   If your incision is sealed with Steri-strips or staples. You may shower 7 days after your procedure and wash your incision with soap and water as long as it is healed. If your incision is closed with Dermabond/Surgical glue. You may shower 1 day after  your pacemaker implant and wash your incision with soap and water. Avoid lotions, ointments, or perfumes over your incision until it is well-healed.   You may use a hot tub or a pool AFTER your wound check appointment if the incision is completely closed.   Your Pacemaker may be MRI compatible. We will discuss this at your first follow up/wound check. .    Remote monitoring is used to monitor your pacemaker from home. This monitoring is scheduled every 91 days by our office. It allows [place] to keep an eye on the functioning of your device to ensure it is working properly. You will routinely see your Electrophysiologist annually (more often if necessary).    Pacemaker Implantation, Care After This sheet gives you  information about how to care for yourself after your procedure. Your health care provider may also give you more specific instructions. If you have problems or questions, contact your health care provider. What can I expect after the procedure? After the procedure, it is common to have:  Mild pain.  Slight bruising.  Some swelling over the incision.  A slight bump over the skin where the device was placed. Sometimes, it is possible to feel the device under the skin. This is normal.  You should received your Pacemaker ID card within 4-8 weeks. Follow these instructions at home: Medicines  Take over-the-counter and prescription medicines only as told by your health care provider.  If you were prescribed an antibiotic medicine, take it as told by your health care provider. Do not stop taking the antibiotic even if you start to feel better. Wound care     Do not remove the bandage on your chest until directed to do so by your health care provider.  After your bandage is removed, you may see pieces of tape called skin adhesive strips over the area where the cut was made (incision site). Let them fall off on their own.  Check the incision site every day to make sure it is not infected, bleeding, or starting to pull apart.  Do not use lotions or ointments near the incision site unless directed to do so.  Keep the incision area clean and dry for 7 days after the procedure or as directed by your health care provider. It takes several weeks for the incision site to completely heal.  Do not take baths, swim, or use a hot tub for 7-10 days or as otherwise directed by your health care provider. Activity  Do not drive or use heavy machinery while taking prescription pain medicine.  Do not drive for 24 hours if you were given a medicine to help you relax (sedative).  Check with your health care provider before you start to drive or play sports.  Avoid sudden jerking, pulling, or chopping  movements that pull your upper arm far away from your body. Avoid these movements for at least 6 weeks or as long as told by your health care provider.  Do not lift your upper arm above your shoulders for at least 6 weeks or as long as told by your health care provider. This means no tennis, golf, or swimming.  You may go back to work when your health care provider says it is okay. Pacemaker care  You may be shown how to transfer data from your pacemaker through the phone to your health care provider.  Always let all health care providers know about your pacemaker before you have any medical procedures or tests.  Wear a  medical ID bracelet or necklace stating that you have a pacemaker. Carry a pacemaker ID card with you at all times.  Your pacemaker battery will last for 5-15 years. Routine checks by your health care provider will let the health care provider know when the battery is starting to run down. The pacemaker will need to be replaced when the battery starts to run down.  Do not use amateur Chief of Staff. Other electrical devices are safe to use, including power tools, lawn mowers, and speakers. If you are unsure of whether something is safe to use, ask your health care provider.  When using your cell phone, hold it to the ear opposite the pacemaker. Do not leave your cell phone in a pocket over the pacemaker.  Avoid places or objects that have a strong electric or magnetic field, including: ? Airport Herbalist. When at the airport, let officials know that you have a pacemaker. ? Power plants. ? Large electrical generators. ? Radiofrequency transmission towers, such as cell phone and radio towers. General instructions  Weigh yourself every day. If you suddenly gain weight, fluid may be building up in your body.  Keep all follow-up visits as told by your health care provider. This is important. Contact a health care provider if:  You gain  weight suddenly.  Your legs or feet swell.  It feels like your heart is fluttering or skipping beats (heart palpitations).  You have chills or a fever.  You have more redness, swelling, or pain around your incisions.  You have more fluid or blood coming from your incisions.  Your incisions feel warm to the touch.  You have pus or a bad smell coming from your incisions. Get help right away if:  You have chest pain.  You have trouble breathing or are short of breath.  You become extremely tired.  You are light-headed or you faint. This information is not intended to replace advice given to you by your health care provider. Make sure you discuss any questions you have with your health care provider.

## 2018-10-13 NOTE — Progress Notes (Addendum)
[facility] DAILY ICU PROGRESS NOTE                   [address]            [place] [address]          [phone]   2 Days Post-Op Procedure(s) (LRB): AORTIC VALVE REPLACEMENT (AVR) with 23 Inspiris Bioprosthetic Aortic valve. (N/A) MITRAL VALVE (MV) REPLACEMENT with 27 Mosaic Bioprosthetic Mitral valve. (N/A) CORONARY ARTERY BYPASS GRAFTING (CABG) x 2 using LIMA to the LAD and endoscopic greater saphenous vein harvesting to the RCA. (N/A) TRANSESOPHAGEAL ECHOCARDIOGRAM (TEE) (N/A) CLIPPING OF LEFT ATRIAL APPENDAGE with 45 AtriCure Clip. (N/A) CLOSURE OF PATENT FORAMEN OVALE (N/A)  Total Length of Stay:  LOS: 2 days   Subjective:  Up in chair.  Has some pain which he states isn't too bad.  Has some nausea after tylenol.  Hasn't eaten anything yet.  Objective: Vital signs in last 24 hours: Temp:  [97.5 F (36.4 C)-98.5 F (36.9 C)] 97.6 F (36.4 C) (09/04 0737) Pulse Rate:  [80-81] 80 (09/04 0600) Cardiac Rhythm: A-V Sequential paced (09/04 0400) Resp:  [14-27] 21 (09/04 0600) BP: (108-141)/(60-76) 125/73 (09/04 0600) SpO2:  [92 %-99 %] 95 % (09/04 0600) Weight:  [113.5 kg] 113.5 kg (09/04 0500)  Filed Weights   10/11/18 0650 10/12/18 0500 10/13/18 0500  Weight: 105.9 kg 113.1 kg 113.5 kg    Weight change: 0.4 kg   Intake/Output from previous day: 09/03 0701 - 09/04 0700 In: 494.6 [I.V.:294.6; IV Piggyback:199.9] Out: 1915 [Urine:1185; Chest Tube:730]  Current Meds: Scheduled Meds: . aspirin  81 mg Oral QODAY  . bisacodyl  10 mg Oral Daily   Or  . bisacodyl  10 mg Rectal Daily  . Chlorhexidine Gluconate Cloth  6 each Topical Daily  . docusate sodium  200 mg Oral Daily  . enoxaparin (LOVENOX) injection  30 mg Subcutaneous QHS  . furosemide  20 mg Intravenous Q6H  . insulin aspart  0-24 Units Subcutaneous Q4H  . mouth rinse  15 mL Mouth Rinse BID  . pantoprazole  40 mg Oral Daily  . sodium chloride flush  3 mL Intravenous Q12H  . warfarin  2.5  mg Oral q1800  . Warfarin - Physician Dosing Inpatient   Does not apply q1800   Continuous Infusions: . sodium chloride    . cefUROXime (ZINACEF)  IV Stopped (10/12/18 2304)  . lactated ringers    . lactated ringers    . milrinone 0.2 mcg/kg/min (10/13/18 0700)   PRN Meds:.metoprolol tartrate, morphine injection, ondansetron (ZOFRAN) IV, oxyCODONE, sodium chloride flush, traMADol  General appearance: alert, cooperative and no distress Heart: regular rate and rhythm and paced Lungs: diminished breath sounds bibasilar Abdomen: soft, non-tender; bowel sounds normal; no masses,  no organomegaly Extremities: edema trace Wound: aquacel in place on sternotomy, EVH site clean and dry  Lab Results: CBC: Recent Labs    10/12/18 0422 10/12/18 0500 10/13/18 0312  WBC 11.0*  --  16.9*  HGB 8.3* 7.8* 8.6*  HCT 25.8* 23.0* 26.4*  PLT 141*  --  115*   BMET:  Recent Labs    10/12/18 0422 10/12/18 0500 10/13/18 0312  NA 140 142 136  K 4.4 4.3 4.4  CL 111  --  107  CO2 19*  --  20*  GLUCOSE 140*  --  169*  BUN 12  --  21  CREATININE 0.94  --  1.13  CALCIUM 7.9*  --  8.0*    CMET: Lab Results  Component Value Date   WBC 16.9 (H) 10/13/2018   HGB 8.6 (L) 10/13/2018   HCT 26.4 (L) 10/13/2018   PLT 115 (L) 10/13/2018   GLUCOSE 169 (H) 10/13/2018   CHOL 237 (H) 04/28/2018   TRIG 96 04/28/2018   HDL 63 04/28/2018   LDLCALC 155 (H) 04/28/2018   ALT 16 10/09/2018   AST 18 10/09/2018   NA 136 10/13/2018   K 4.4 10/13/2018   CL 107 10/13/2018   CREATININE 1.13 10/13/2018   BUN 21 10/13/2018   CO2 20 (L) 10/13/2018   PSA 5.64 07/14/2018   INR 1.5 (H) 10/13/2018   HGBA1C 5.8 (H) 10/09/2018      PT/INR:  Recent Labs    10/13/18 0312  LABPROT 17.9*  INR 1.5*   Radiology: Dg Chest Port 1 View  Result Date: 10/13/2018 CLINICAL DATA:  Follow-up cardiac surgery EXAM: PORTABLE CHEST 1 VIEW COMPARISON:  10/12/2018 FINDINGS: Changes consistent with recent cardiac surgery are  again noted and stable. Mediastinal drain and bilateral thoracostomy tubes as well as a pericardial drain are seen. Right jugular sheath is again noted. The Swan-Ganz catheter has been removed in the interval. Minimal bibasilar atelectasis is again noted. No pneumothorax is seen. IMPRESSION: Tubes and lines as described. No pneumothorax is noted.  Bibasilar atelectasis is seen. Electronically Signed   By: Inez Catalina M.D.   On: 10/13/2018 06:40     Assessment/Plan: S/P Procedure(s) (LRB): AORTIC VALVE REPLACEMENT (AVR) with 23 Inspiris Bioprosthetic Aortic valve. (N/A) MITRAL VALVE (MV) REPLACEMENT with 27 Mosaic Bioprosthetic Mitral valve. (N/A) CORONARY ARTERY BYPASS GRAFTING (CABG) x 2 using LIMA to the LAD and endoscopic greater saphenous vein harvesting to the RCA. (N/A) TRANSESOPHAGEAL ECHOCARDIOGRAM (TEE) (N/A) CLIPPING OF LEFT ATRIAL APPENDAGE with 45 AtriCure Clip. (N/A) CLOSURE OF PATENT FORAMEN OVALE (N/A)  1. CV- CHB, currently on temporary pacing at 80- Neo is off, spoke with nurse to wean Milrinone off today, EP is following 2. INR 1.5, will repeat coumadin dose this evening 3. Pulm- CT output 730 cc output yesterday, no air leak present... patient with extensive radiation to chest, lots of inflammation present.Marland Kitchen leave chest tubes in place today 4. Renal- creatinine remains stable, weight is elevated about 17 lbs, IV lasix ordered, K at 4.4 5  Expected post operative blood loss anemia, mild hgb at 8.6 6. Expected post operative thrombocytopenia- plt count dropped down to 115 from 140 monitor 7. Dispo- patient stable, remains in CHB, EP is following, continue coumadin, CT output remains high, continue diuretics, watch platelets     Ellwood Handler 10/13/2018 8:53 AM \  Monitor postop heart block and continue to AV paced Diuresis Leave chest tubes in place until output diminished Continue low-dose Coumadin loading and follow INR  patient examined and medical record  reviewed,agree with above note. Tharon Aquas Trigt III 10/13/2018

## 2018-10-13 NOTE — Progress Notes (Addendum)
  Note continued CHB.  We will plan PPM early next week if conduction does not recover.  Please call with any questions.   Shirley Friar, PA-C  Pager: [phone]  10/13/2018 10:53 AM

## 2018-10-14 ENCOUNTER — Inpatient Hospital Stay (HOSPITAL_COMMUNITY): Payer: Medicare HMO

## 2018-10-14 LAB — POCT I-STAT 7, (LYTES, BLD GAS, ICA,H+H)
Acid-base deficit: 3 mmol/L — ABNORMAL HIGH (ref 0.0–2.0)
Acid-base deficit: 1 mmol/L (ref 0.0–2.0)
Acid-base deficit: 7 mmol/L — ABNORMAL HIGH (ref 0.0–2.0)
Bicarbonate: 23.9 mmol/L (ref 20.0–28.0)
Bicarbonate: 24.5 mmol/L (ref 20.0–28.0)
Bicarbonate: 19.8 mmol/L — ABNORMAL LOW (ref 20.0–28.0)
Calcium, Ion: 1.24 mmol/L (ref 1.15–1.40)
Calcium, Ion: 0.93 mmol/L — ABNORMAL LOW (ref 1.15–1.40)
Calcium, Ion: 0.96 mmol/L — ABNORMAL LOW (ref 1.15–1.40)
HCT: 41 % (ref 39.0–52.0)
HCT: 29 % — ABNORMAL LOW (ref 39.0–52.0)
HCT: 30 % — ABNORMAL LOW (ref 39.0–52.0)
Hemoglobin: 13.9 g/dL (ref 13.0–17.0)
Hemoglobin: 9.9 g/dL — ABNORMAL LOW (ref 13.0–17.0)
Hemoglobin: 10.2 g/dL — ABNORMAL LOW (ref 13.0–17.0)
O2 Saturation: 100 %
O2 Saturation: 100 %
O2 Saturation: 97 %
Potassium: 4.1 mmol/L (ref 3.5–5.1)
Potassium: 4.1 mmol/L (ref 3.5–5.1)
Potassium: 4.1 mmol/L (ref 3.5–5.1)
Sodium: 142 mmol/L (ref 135–145)
Sodium: 143 mmol/L (ref 135–145)
Sodium: 145 mmol/L (ref 135–145)
TCO2: 25 mmol/L (ref 22–32)
TCO2: 26 mmol/L (ref 22–32)
TCO2: 21 mmol/L — ABNORMAL LOW (ref 22–32)
pCO2 arterial: 46.7 mmHg (ref 32.0–48.0)
pCO2 arterial: 42.3 mmHg (ref 32.0–48.0)
pCO2 arterial: 44.7 mmHg (ref 32.0–48.0)
pH, Arterial: 7.318 — ABNORMAL LOW (ref 7.350–7.450)
pH, Arterial: 7.371 (ref 7.350–7.450)
pH, Arterial: 7.255 — ABNORMAL LOW (ref 7.350–7.450)
pO2, Arterial: 278 mmHg — ABNORMAL HIGH (ref 83.0–108.0)
pO2, Arterial: 363 mmHg — ABNORMAL HIGH (ref 83.0–108.0)
pO2, Arterial: 104 mmHg (ref 83.0–108.0)

## 2018-10-14 LAB — POCT I-STAT 4, (NA,K, GLUC, HGB,HCT)
Glucose, Bld: 101 mg/dL — ABNORMAL HIGH (ref 70–99)
Glucose, Bld: 119 mg/dL — ABNORMAL HIGH (ref 70–99)
Glucose, Bld: 113 mg/dL — ABNORMAL HIGH (ref 70–99)
Glucose, Bld: 158 mg/dL — ABNORMAL HIGH (ref 70–99)
Glucose, Bld: 181 mg/dL — ABNORMAL HIGH (ref 70–99)
Glucose, Bld: 161 mg/dL — ABNORMAL HIGH (ref 70–99)
Glucose, Bld: 104 mg/dL — ABNORMAL HIGH (ref 70–99)
HCT: 39 % (ref 39.0–52.0)
HCT: 35 % — ABNORMAL LOW (ref 39.0–52.0)
HCT: 26 % — ABNORMAL LOW (ref 39.0–52.0)
HCT: 26 % — ABNORMAL LOW (ref 39.0–52.0)
HCT: 26 % — ABNORMAL LOW (ref 39.0–52.0)
HCT: 26 % — ABNORMAL LOW (ref 39.0–52.0)
HCT: 27 % — ABNORMAL LOW (ref 39.0–52.0)
Hemoglobin: 13.3 g/dL (ref 13.0–17.0)
Hemoglobin: 11.9 g/dL — ABNORMAL LOW (ref 13.0–17.0)
Hemoglobin: 8.8 g/dL — ABNORMAL LOW (ref 13.0–17.0)
Hemoglobin: 8.8 g/dL — ABNORMAL LOW (ref 13.0–17.0)
Hemoglobin: 8.8 g/dL — ABNORMAL LOW (ref 13.0–17.0)
Hemoglobin: 8.8 g/dL — ABNORMAL LOW (ref 13.0–17.0)
Hemoglobin: 9.2 g/dL — ABNORMAL LOW (ref 13.0–17.0)
Potassium: 4 mmol/L (ref 3.5–5.1)
Potassium: 4 mmol/L (ref 3.5–5.1)
Potassium: 4 mmol/L (ref 3.5–5.1)
Potassium: 4.3 mmol/L (ref 3.5–5.1)
Potassium: 3.6 mmol/L (ref 3.5–5.1)
Potassium: 4.8 mmol/L (ref 3.5–5.1)
Potassium: 4 mmol/L (ref 3.5–5.1)
Sodium: 140 mmol/L (ref 135–145)
Sodium: 140 mmol/L (ref 135–145)
Sodium: 143 mmol/L (ref 135–145)
Sodium: 136 mmol/L (ref 135–145)
Sodium: 138 mmol/L (ref 135–145)
Sodium: 138 mmol/L (ref 135–145)
Sodium: 142 mmol/L (ref 135–145)

## 2018-10-14 LAB — GLUCOSE, CAPILLARY
Glucose-Capillary: 134 mg/dL — ABNORMAL HIGH (ref 70–99)
Glucose-Capillary: 138 mg/dL — ABNORMAL HIGH (ref 70–99)
Glucose-Capillary: 156 mg/dL — ABNORMAL HIGH (ref 70–99)
Glucose-Capillary: 160 mg/dL — ABNORMAL HIGH (ref 70–99)
Glucose-Capillary: 167 mg/dL — ABNORMAL HIGH (ref 70–99)

## 2018-10-14 LAB — BASIC METABOLIC PANEL
Anion gap: 13 (ref 5–15)
BUN: 26 mg/dL — ABNORMAL HIGH (ref 8–23)
CO2: 18 mmol/L — ABNORMAL LOW (ref 22–32)
Calcium: 8.1 mg/dL — ABNORMAL LOW (ref 8.9–10.3)
Chloride: 101 mmol/L (ref 98–111)
Creatinine, Ser: 1.02 mg/dL (ref 0.61–1.24)
GFR calc Af Amer: >60 mL/min (ref >60)
GFR calc non Af Amer: >60 mL/min (ref >60)
Glucose, Bld: 157 mg/dL — ABNORMAL HIGH (ref 70–99)
Potassium: 4.1 mmol/L (ref 3.5–5.1)
Sodium: 132 mmol/L — ABNORMAL LOW (ref 135–145)

## 2018-10-14 LAB — PROTIME-INR
INR: 1.5 — ABNORMAL HIGH (ref 0.8–1.2)
Prothrombin Time: 17.8 seconds — ABNORMAL HIGH (ref 11.4–15.2)

## 2018-10-14 LAB — CBC
HCT: 26.7 % — ABNORMAL LOW (ref 39.0–52.0)
Hemoglobin: 8.9 g/dL — ABNORMAL LOW (ref 13.0–17.0)
MCH: 28.8 pg (ref 26.0–34.0)
MCHC: 33.3 g/dL (ref 30.0–36.0)
MCV: 86.4 fL (ref 80.0–100.0)
Platelets: 98 10*3/uL — ABNORMAL LOW (ref 150–400)
RBC: 3.09 MIL/uL — ABNORMAL LOW (ref 4.22–5.81)
RDW: 15.9 % — ABNORMAL HIGH (ref 11.5–15.5)
WBC: 18 10*3/uL — ABNORMAL HIGH (ref 4.0–10.5)
nRBC: 0.5 % — ABNORMAL HIGH (ref 0.0–0.2)

## 2018-10-14 MED ORDER — POTASSIUM CHLORIDE CRYS ER 20 MEQ PO TBCR
20.0000 meq | EXTENDED_RELEASE_TABLET | Freq: Two times a day (BID) | ORAL | Status: AC
  Administered 2018-10-15 – 2018-10-16 (×4): 20 meq via ORAL
  Filled 2018-10-14 (×4): qty 1

## 2018-10-14 MED ORDER — INSULIN ASPART 100 UNIT/ML ~~LOC~~ SOLN
0.0000 [IU] | Freq: Three times a day (TID) | SUBCUTANEOUS | Status: DC
  Administered 2018-10-14: 16:00:00 3 [IU] via SUBCUTANEOUS
  Administered 2018-10-14 (×2): 4 [IU] via SUBCUTANEOUS
  Administered 2018-10-15 – 2018-10-18 (×4): 3 [IU] via SUBCUTANEOUS

## 2018-10-14 MED ORDER — WARFARIN SODIUM 3 MG PO TABS
3.0000 mg | ORAL_TABLET | Freq: Every day | ORAL | Status: DC
  Administered 2018-10-15: 3 mg via ORAL
  Filled 2018-10-14: qty 1

## 2018-10-14 MED ORDER — FUROSEMIDE 10 MG/ML IJ SOLN
40.0000 mg | Freq: Two times a day (BID) | INTRAMUSCULAR | Status: DC
  Administered 2018-10-14 – 2018-10-16 (×5): 40 mg via INTRAVENOUS
  Filled 2018-10-14 (×5): qty 4

## 2018-10-14 NOTE — Progress Notes (Addendum)
[address]       [place] [address]             [phone]      3 Days Post-Op Procedure(s) (LRB): AORTIC VALVE REPLACEMENT (AVR) with 23 Inspiris Bioprosthetic Aortic valve. (N/A) MITRAL VALVE (MV) REPLACEMENT with 27 Mosaic Bioprosthetic Mitral valve. (N/A) CORONARY ARTERY BYPASS GRAFTING (CABG) x 2 using LIMA to the LAD and endoscopic greater saphenous vein harvesting to the RCA. (N/A) TRANSESOPHAGEAL ECHOCARDIOGRAM (TEE) (N/A) CLIPPING OF LEFT ATRIAL APPENDAGE with 45 AtriCure Clip. (N/A) CLOSURE OF PATENT FORAMEN OVALE (N/A)   Subjective:  Up in chair.  Patient had episode of chest pain, dizziness and shortness of breath upon standing.  Per nursing he dumped about 180 cc from his chest tube at that time.  Currently he is feeling better.  Objective: Vital signs in last 24 hours: Temp:  [97.6 F (36.4 C)-98.3 F (36.8 C)] 97.7 F (36.5 C) (09/05 1521) Pulse Rate:  [79-81] 80 (09/05 1400) Cardiac Rhythm: A-V Sequential paced (09/05 0800) Resp:  [14-27] 15 (09/05 1400) BP: (98-139)/(73-92) 98/81 (09/05 1400) SpO2:  [91 %-100 %] 96 % (09/05 1400) Weight:  [112.5 kg] 112.5 kg (09/05 0500)  Intake/Output from previous day: 09/04 0701 - 09/05 0700 In: 1470.1 [P.O.:1320; I.V.:50.1; IV Piggyback:100] Out: 1990 [Urine:1210; Chest Tube:780] Intake/Output this shift: Total I/O In: 340 [P.O.:340] Out: 650 [Urine:450; Chest Tube:200]  General appearance: alert, cooperative and no distress Heart: regular rate and rhythm and paced Lungs: clear to auscultation bilaterally Abdomen: soft, non-tender; bowel sounds normal; no masses,  no organomegaly Extremities: edema trace Wound: clean and dry EVH site, aquacel remains in place  Lab Results: Recent Labs    10/13/18 0312 10/14/18 0305  WBC 16.9* 18.0*  HGB 8.6* 8.9*  HCT 26.4* 26.7*  PLT 115* 98*   BMET:  Recent Labs    10/13/18 0312 10/14/18 0305  NA 136 132*  K 4.4 4.1  CL 107 101  CO2  20* 18*  GLUCOSE 169* 157*  BUN 21 26*  CREATININE 1.13 1.02  CALCIUM 8.0* 8.1*    PT/INR:  Recent Labs    10/14/18 0305  LABPROT 17.8*  INR 1.5*   ABG    Component Value Date/Time   PHART 7.349 (L) 10/12/2018 0500   HCO3 19.4 (L) 10/12/2018 0500   TCO2 20 (L) 10/12/2018 0500   ACIDBASEDEF 6.0 (H) 10/12/2018 0500   O2SAT 97.0 10/12/2018 0500   CBG (last 3)  Recent Labs    10/14/18 0832 10/14/18 1124 10/14/18 1518  GLUCAP 167* 160* 134*    Assessment/Plan: S/P Procedure(s) (LRB): AORTIC VALVE REPLACEMENT (AVR) with 23 Inspiris Bioprosthetic Aortic valve. (N/A) MITRAL VALVE (MV) REPLACEMENT with 27 Mosaic Bioprosthetic Mitral valve. (N/A) CORONARY ARTERY BYPASS GRAFTING (CABG) x 2 using LIMA to the LAD and endoscopic greater saphenous vein harvesting to the RCA. (N/A) TRANSESOPHAGEAL ECHOCARDIOGRAM (TEE) (N/A) CLIPPING OF LEFT ATRIAL APPENDAGE with 45 AtriCure Clip. (N/A) CLOSURE OF PATENT FORAMEN OVALE (N/A)  1. CV- remains in CHB under pacer, off all drips, BP is labile in the 90s.. INR at 1.5, will repeat coumadin at 2.5 mg daily 2. Pulm- CT output 780 cc yesterday, leave in place today, no air leak present 3. Renal- creatinine WNL, diuresing well, on IV Lasix 4. Expected post operative blood loss anemia, mild Hgb stable at 8.9 5. Expected thrombocytopenia, Plt count again dropped to 95 monitor, may need to stop Lovenox 6. Dispo- patient remains in  CHB, some vagal issues with standing this morning, on aggressive IV diuretics, weight is trending down, continue coumadin, watch platelet count  LOS: 3 days    Ellwood Handler 10/14/2018   The patient remains in heart block with an underlying native heart rate of 55-60/min.  Chest tubes are still draining serosanguineous fluid but tapering off today.  Weight is slowly decreasing.  EP cardiology has seen patient with plans for permanent pacemaker probably next week.  Patient feeling somewhat better each day. patient examined  and medical record reviewed,agree with above note. Tharon Aquas Trigt III 10/14/2018

## 2018-10-14 NOTE — Progress Notes (Signed)
Progress Note  Patient Name: [name] Date of Encounter: 10/14/2018  Primary Cardiologist:   Lauree Chandler, MD   Subjective   Episode of nausea and diaphoresis this AM.  Does still have underlying CHB.   Inpatient Medications    Scheduled Meds: . aspirin  81 mg Oral QODAY  . bisacodyl  10 mg Oral Daily   Or  . bisacodyl  10 mg Rectal Daily  . Chlorhexidine Gluconate Cloth  6 each Topical Daily  . docusate sodium  200 mg Oral Daily  . enoxaparin (LOVENOX) injection  30 mg Subcutaneous QHS  . furosemide  40 mg Intravenous BID  . insulin aspart  0-20 Units Subcutaneous TID WC  . mouth rinse  15 mL Mouth Rinse BID  . pantoprazole  40 mg Oral Daily  . [START ON 10/15/2018] potassium chloride  20 mEq Oral BID  . sodium chloride flush  10-40 mL Intracatheter Q12H  . sodium chloride flush  3 mL Intravenous Q12H  . warfarin  2.5 mg Oral q1800  . Warfarin - Physician Dosing Inpatient   Does not apply q1800   Continuous Infusions: . sodium chloride    . lactated ringers     PRN Meds: metoprolol tartrate, morphine injection, ondansetron (ZOFRAN) IV, oxyCODONE, sodium chloride flush, sodium chloride flush, traMADol   Vital Signs    Vitals:   10/14/18 0600 10/14/18 0700 10/14/18 0727 10/14/18 0800  BP: 128/77 122/83  128/84  Pulse: 79 80  80  Resp: (!) 21 (!) 21  (!) 21  Temp:   98.3 F (36.8 C)   TempSrc:   Oral   SpO2: 96% 97%  96%  Weight:      Height:        Intake/Output Summary (Last 24 hours) at 10/14/2018 0854 Last data filed at 10/14/2018 0750 Gross per 24 hour  Intake 1470.1 ml  Output 1785 ml  Net -314.9 ml   Filed Weights   10/12/18 0500 10/13/18 0500 10/14/18 0500  Weight: 113.1 kg 113.5 kg 112.5 kg    Telemetry    AV paced - Personally Reviewed  ECG    NSR, CHB predominantly, occasional AV conduction - Personally Reviewed  Physical Exam   GEN: No acute distress.   Neck: No  JVD Cardiac: RRR,soft systolic murmur, distant heart  sounds.  Respiratory: Clear  to auscultation bilaterally. GI: Soft, nontender, non-distended  MS: No  edema; No deformity. Neuro:  Nonfocal  Psych: Normal affect   Labs    Chemistry Recent Labs  Lab 10/09/18 1543  10/12/18 0422 10/12/18 0500 10/13/18 0312 10/14/18 0305  NA 135   < > 140 142 136 132*  K 3.5   < > 4.4 4.3 4.4 4.1  CL 105   < > 111  --  107 101  CO2 17*   < > 19*  --  20* 18*  GLUCOSE 125*   < > 140*  --  169* 157*  BUN 21   < > 12  --  21 26*  CREATININE 1.02   < > 0.94  --  1.13 1.02  CALCIUM 8.9   < > 7.9*  --  8.0* 8.1*  PROT 7.0  --   --   --   --   --   ALBUMIN 4.1  --   --   --   --   --   AST 18  --   --   --   --   --  ALT 16  --   --   --   --   --   ALKPHOS 56  --   --   --   --   --   BILITOT 0.5  --   --   --   --   --   GFRNONAA >60   < > >60  --  >60 >60  GFRAA >60   < > >60  --  >60 >60  ANIONGAP 13   < > 10  --  9 13   < > = values in this interval not displayed.     Hematology Recent Labs  Lab 10/12/18 0422 10/12/18 0500 10/13/18 0312 10/14/18 0305  WBC 11.0*  --  16.9* 18.0*  RBC 2.88*  --  2.99* 3.09*  HGB 8.3* 7.8* 8.6* 8.9*  HCT 25.8* 23.0* 26.4* 26.7*  MCV 89.6  --  88.3 86.4  MCH 28.8  --  28.8 28.8  MCHC 32.2  --  32.6 33.3  RDW 14.7  --  15.9* 15.9*  PLT 141*  --  115* 98*    Cardiac EnzymesNo results for input(s): TROPONINI in the last 168 hours. No results for input(s): TROPIPOC in the last 168 hours.   BNPNo results for input(s): BNP, PROBNP in the last 168 hours.   DDimer No results for input(s): DDIMER in the last 168 hours.   Radiology    Dg Chest Port 1 View  Result Date: 10/13/2018 CLINICAL DATA:  Follow-up cardiac surgery EXAM: PORTABLE CHEST 1 VIEW COMPARISON:  10/12/2018 FINDINGS: Changes consistent with recent cardiac surgery are again noted and stable. Mediastinal drain and bilateral thoracostomy tubes as well as a pericardial drain are seen. Right jugular sheath is again noted. The Swan-Ganz  catheter has been removed in the interval. Minimal bibasilar atelectasis is again noted. No pneumothorax is seen. IMPRESSION: Tubes and lines as described. No pneumothorax is noted.  Bibasilar atelectasis is seen. Electronically Signed   By: Inez Catalina M.D.   On: 10/13/2018 06:40    Cardiac Studies   TEE .  Left ventricle: LV systolic function is low normal. .  Septum: Patent Foramen Ovale present. .  Left atrium: Patent foramen ovale present. .  Aortic valve: The valve is trileaflet. Moderate valve thickening present. Severe valve calcification present. .  Mitral valve:  Moderate to severe regurgitation. .  Right ventricle:  Normal ejection fraction.  Patient Profile     68 y.o. male with a history of aortic stenosis, mitral stenosis, CAD, HLD, previous thymectomy with radiation therapy to the chest, and tobacco abuse who is being seen for the evaluation of CHB s/p AV replacement, MV replacement, and CABG at the request of Dr. Roxy Manns.   Assessment & Plan    AVR/MVR/CABG:    Continues with chest tube drainage.  Overall steady post op progress  CHB:  Continued with adequate pacing from temporary wires.   Plan probable permanent pacing on Tuesday.  Will follow as needed and review tele over the weekend.     For questions or updates, please contact [facility] Please consult [email or web address] for contact info under Cardiology/STEMI.   Signed, Minus Breeding, MD  10/14/2018, 8:54 AM

## 2018-10-15 ENCOUNTER — Inpatient Hospital Stay (HOSPITAL_COMMUNITY): Payer: Medicare HMO

## 2018-10-15 LAB — BASIC METABOLIC PANEL
Anion gap: 11 (ref 5–15)
BUN: 36 mg/dL — ABNORMAL HIGH (ref 8–23)
CO2: 22 mmol/L (ref 22–32)
Calcium: 7.9 mg/dL — ABNORMAL LOW (ref 8.9–10.3)
Chloride: 97 mmol/L — ABNORMAL LOW (ref 98–111)
Creatinine, Ser: 1.09 mg/dL (ref 0.61–1.24)
GFR calc Af Amer: >60 mL/min (ref >60)
GFR calc non Af Amer: >60 mL/min (ref >60)
Glucose, Bld: 126 mg/dL — ABNORMAL HIGH (ref 70–99)
Potassium: 4.5 mmol/L (ref 3.5–5.1)
Sodium: 130 mmol/L — ABNORMAL LOW (ref 135–145)

## 2018-10-15 LAB — BPAM RBC
Blood Product Expiration Date: 202009032359
Blood Product Expiration Date: 202009042359
Blood Product Expiration Date: 202009162359
Blood Product Expiration Date: 202009212359
Blood Product Expiration Date: 202009232359
Blood Product Expiration Date: 202009242359
ISSUE DATE / TIME: 202009021050
ISSUE DATE / TIME: 202009021050
ISSUE DATE / TIME: 202009030039
ISSUE DATE / TIME: 202009042016
ISSUE DATE / TIME: 202009042016
Unit Type and Rh: 7300
Unit Type and Rh: 7300
Unit Type and Rh: 7300
Unit Type and Rh: 7300
Unit Type and Rh: 7300
Unit Type and Rh: 7300

## 2018-10-15 LAB — CBC
HCT: 27.2 % — ABNORMAL LOW (ref 39.0–52.0)
Hemoglobin: 8.8 g/dL — ABNORMAL LOW (ref 13.0–17.0)
MCH: 28.8 pg (ref 26.0–34.0)
MCHC: 32.4 g/dL (ref 30.0–36.0)
MCV: 88.9 fL (ref 80.0–100.0)
Platelets: 90 10*3/uL — ABNORMAL LOW (ref 150–400)
RBC: 3.06 MIL/uL — ABNORMAL LOW (ref 4.22–5.81)
RDW: 15.8 % — ABNORMAL HIGH (ref 11.5–15.5)
WBC: 16.5 10*3/uL — ABNORMAL HIGH (ref 4.0–10.5)
nRBC: 2.5 % — ABNORMAL HIGH (ref 0.0–0.2)

## 2018-10-15 LAB — GLUCOSE, CAPILLARY
Glucose-Capillary: 131 mg/dL — ABNORMAL HIGH (ref 70–99)
Glucose-Capillary: 133 mg/dL — ABNORMAL HIGH (ref 70–99)
Glucose-Capillary: 87 mg/dL (ref 70–99)
Glucose-Capillary: 114 mg/dL — ABNORMAL HIGH (ref 70–99)

## 2018-10-15 LAB — TYPE AND SCREEN
ABO/RH(D): B POS
Antibody Screen: NEGATIVE
Unit division: 0
Unit division: 0
Unit division: 0
Unit division: 0
Unit division: 0
Unit division: 0

## 2018-10-15 LAB — PROTIME-INR
INR: 1.5 — ABNORMAL HIGH (ref 0.8–1.2)
Prothrombin Time: 18.2 seconds — ABNORMAL HIGH (ref 11.4–15.2)

## 2018-10-15 MED ORDER — AMLODIPINE BESYLATE 5 MG PO TABS
5.0000 mg | ORAL_TABLET | Freq: Every day | ORAL | Status: DC
  Administered 2018-10-15 – 2018-10-20 (×5): 5 mg via ORAL
  Filled 2018-10-15 (×5): qty 1

## 2018-10-15 MED ORDER — HYDRALAZINE HCL 20 MG/ML IJ SOLN: 20.0000 mg | INTRAMUSCULAR | Status: DC | PRN

## 2018-10-15 MED ORDER — LISINOPRIL 10 MG PO TABS
10.0000 mg | ORAL_TABLET | Freq: Every day | ORAL | Status: DC
  Administered 2018-10-15 – 2018-10-20 (×5): 10 mg via ORAL
  Filled 2018-10-15 (×6): qty 1

## 2018-10-15 MED ORDER — SORBITOL 70 % SOLN
45.0000 mL | Freq: Once | Status: AC
  Administered 2018-10-15: 45 mL via ORAL
  Filled 2018-10-15: qty 60

## 2018-10-15 NOTE — Progress Notes (Signed)
CT surgery p.m. Rounds  Stable day Titrating meds for blood pressure control Ambulated in hallway AV paced for heart block with intrinsic heart rate 55

## 2018-10-15 NOTE — Plan of Care (Signed)
  Problem: Activity: Goal: Risk for activity intolerance will decrease Outcome: Progressing   Problem: Nutrition: Goal: Adequate nutrition will be maintained Outcome: Progressing   Problem: Pain Managment: Goal: General experience of comfort will improve Outcome: Progressing   Problem: Safety: Goal: Ability to remain free from injury will improve Outcome: Progressing   Problem: Skin Integrity: Goal: Risk for impaired skin integrity will decrease Outcome: Progressing   

## 2018-10-15 NOTE — Progress Notes (Signed)
4 Days Post-Op Procedure(s) (LRB): AORTIC VALVE REPLACEMENT (AVR) with 23 Inspiris Bioprosthetic Aortic valve. (N/A) MITRAL VALVE (MV) REPLACEMENT with 27 Mosaic Bioprosthetic Mitral valve. (N/A) CORONARY ARTERY BYPASS GRAFTING (CABG) x 2 using LIMA to the LAD and endoscopic greater saphenous vein harvesting to the RCA. (N/A) TRANSESOPHAGEAL ECHOCARDIOGRAM (TEE) (N/A) CLIPPING OF LEFT ATRIAL APPENDAGE with 45 AtriCure Clip. (N/A) CLOSURE OF PATENT FORAMEN OVALE (N/A) Subjective: Getting stronger Remains in heart block, currently AV paced at 90 Receiving IV Lasix for fluid overload Chest tubes with minimal output, will removed today All central lines removed INR 1.6, continue low-dose Coumadin  Objective: Vital signs in last 24 hours: Temp:  [97.5 F (36.4 C)-98.1 F (36.7 C)] 97.5 F (36.4 C) (09/06 0731) Pulse Rate:  [78-81] 80 (09/06 0800) Cardiac Rhythm: Ventricular paced (09/06 0800) Resp:  [10-24] 14 (09/06 0800) BP: (98-133)/(71-88) 122/78 (09/06 0800) SpO2:  [94 %-98 %] 96 % (09/06 0800) Weight:  [112.8 kg] 112.8 kg (09/06 0500)  Hemodynamic parameters for last 24 hours:    Intake/Output from previous day: 09/05 0701 - 09/06 0700 In: 910 [P.O.:900; I.V.:10] Out: 1970 [Urine:1480; Chest Tube:490] Intake/Output this shift: No intake/output data recorded.  Chest incision clean and dry Abdomen soft Extremities with minimal edema  Lab Results: Recent Labs    10/14/18 0305 10/15/18 0420  WBC 18.0* 16.5*  HGB 8.9* 8.8*  HCT 26.7* 27.2*  PLT 98* 90*   BMET:  Recent Labs    10/14/18 0305 10/15/18 0420  NA 132* 130*  K 4.1 4.5  CL 101 97*  CO2 18* 22  GLUCOSE 157* 126*  BUN 26* 36*  CREATININE 1.02 1.09  CALCIUM 8.1* 7.9*    PT/INR:  Recent Labs    10/15/18 0420  LABPROT 18.2*  INR 1.5*   ABG    Component Value Date/Time   PHART 7.349 (L) 10/12/2018 0500   HCO3 19.4 (L) 10/12/2018 0500   TCO2 20 (L) 10/12/2018 0500   ACIDBASEDEF 6.0 (H)  10/12/2018 0500   O2SAT 97.0 10/12/2018 0500   CBG (last 3)  Recent Labs    10/14/18 1518 10/14/18 2137 10/15/18 0640  GLUCAP 134* 156* 131*    Assessment/Plan: S/P Procedure(s) (LRB): AORTIC VALVE REPLACEMENT (AVR) with 23 Inspiris Bioprosthetic Aortic valve. (N/A) MITRAL VALVE (MV) REPLACEMENT with 27 Mosaic Bioprosthetic Mitral valve. (N/A) CORONARY ARTERY BYPASS GRAFTING (CABG) x 2 using LIMA to the LAD and endoscopic greater saphenous vein harvesting to the RCA. (N/A) TRANSESOPHAGEAL ECHOCARDIOGRAM (TEE) (N/A) CLIPPING OF LEFT ATRIAL APPENDAGE with 45 AtriCure Clip. (N/A) CLOSURE OF PATENT FORAMEN OVALE (N/A) Remove chest tubes Sorbitol for bowel movement Coumadin 3 mg daily Continue IV Lasix, weight up 7 kg   LOS: 4 days    Tharon Aquas Trigt III 10/15/2018

## 2018-10-16 ENCOUNTER — Inpatient Hospital Stay (HOSPITAL_COMMUNITY): Payer: Medicare HMO

## 2018-10-16 DIAGNOSIS — I441 Atrioventricular block, second degree: Secondary | ICD-10-CM

## 2018-10-16 DIAGNOSIS — I2510 Atherosclerotic heart disease of native coronary artery without angina pectoris: Secondary | ICD-10-CM

## 2018-10-16 DIAGNOSIS — I483 Typical atrial flutter: Secondary | ICD-10-CM

## 2018-10-16 DIAGNOSIS — I350 Nonrheumatic aortic (valve) stenosis: Secondary | ICD-10-CM

## 2018-10-16 LAB — BASIC METABOLIC PANEL
Anion gap: 10 (ref 5–15)
BUN: 33 mg/dL — ABNORMAL HIGH (ref 8–23)
CO2: 25 mmol/L (ref 22–32)
Calcium: 7.6 mg/dL — ABNORMAL LOW (ref 8.9–10.3)
Chloride: 96 mmol/L — ABNORMAL LOW (ref 98–111)
Creatinine, Ser: 0.95 mg/dL (ref 0.61–1.24)
GFR calc Af Amer: >60 mL/min (ref >60)
GFR calc non Af Amer: >60 mL/min (ref >60)
Glucose, Bld: 98 mg/dL (ref 70–99)
Potassium: 4.1 mmol/L (ref 3.5–5.1)
Sodium: 131 mmol/L — ABNORMAL LOW (ref 135–145)

## 2018-10-16 LAB — PROTIME-INR
INR: 1.5 — ABNORMAL HIGH (ref 0.8–1.2)
Prothrombin Time: 18 seconds — ABNORMAL HIGH (ref 11.4–15.2)

## 2018-10-16 LAB — GLUCOSE, CAPILLARY
Glucose-Capillary: 104 mg/dL — ABNORMAL HIGH (ref 70–99)
Glucose-Capillary: 129 mg/dL — ABNORMAL HIGH (ref 70–99)
Glucose-Capillary: 116 mg/dL — ABNORMAL HIGH (ref 70–99)
Glucose-Capillary: 120 mg/dL — ABNORMAL HIGH (ref 70–99)

## 2018-10-16 LAB — CBC
HCT: 26.3 % — ABNORMAL LOW (ref 39.0–52.0)
Hemoglobin: 8.6 g/dL — ABNORMAL LOW (ref 13.0–17.0)
MCH: 29.4 pg (ref 26.0–34.0)
MCHC: 32.7 g/dL (ref 30.0–36.0)
MCV: 89.8 fL (ref 80.0–100.0)
Platelets: 94 10*3/uL — ABNORMAL LOW (ref 150–400)
RBC: 2.93 MIL/uL — ABNORMAL LOW (ref 4.22–5.81)
RDW: 16.1 % — ABNORMAL HIGH (ref 11.5–15.5)
WBC: 17 10*3/uL — ABNORMAL HIGH (ref 4.0–10.5)
nRBC: 2.3 % — ABNORMAL HIGH (ref 0.0–0.2)

## 2018-10-16 MED ORDER — WARFARIN SODIUM 5 MG PO TABS
5.0000 mg | ORAL_TABLET | Freq: Every day | ORAL | Status: DC
  Administered 2018-10-16 – 2018-10-17 (×2): 5 mg via ORAL
  Filled 2018-10-16 (×2): qty 1

## 2018-10-16 MED ORDER — FUROSEMIDE 10 MG/ML IJ SOLN
40.0000 mg | Freq: Every day | INTRAMUSCULAR | Status: DC
  Administered 2018-10-17: 09:00:00 40 mg via INTRAVENOUS
  Filled 2018-10-16: qty 4

## 2018-10-16 NOTE — Progress Notes (Signed)
Progress Note  Patient Name: [name] Date of Encounter: 10/16/2018  Primary Cardiologist:   Lauree Chandler, MD   Subjective   Some nausea this am. No dyspnea.   Inpatient Medications    Scheduled Meds: . amLODipine  5 mg Oral Daily  . aspirin  81 mg Oral QODAY  . bisacodyl  10 mg Oral Daily   Or  . bisacodyl  10 mg Rectal Daily  . Chlorhexidine Gluconate Cloth  6 each Topical Daily  . docusate sodium  200 mg Oral Daily  . enoxaparin (LOVENOX) injection  30 mg Subcutaneous QHS  . furosemide  40 mg Intravenous BID  . insulin aspart  0-20 Units Subcutaneous TID WC  . lisinopril  10 mg Oral Daily  . mouth rinse  15 mL Mouth Rinse BID  . pantoprazole  40 mg Oral Daily  . potassium chloride  20 mEq Oral BID  . sodium chloride flush  10-40 mL Intracatheter Q12H  . sodium chloride flush  3 mL Intravenous Q12H  . warfarin  3 mg Oral q1800  . Warfarin - Physician Dosing Inpatient   Does not apply q1800   Continuous Infusions: . sodium chloride    . lactated ringers     PRN Meds: hydrALAZINE, metoprolol tartrate, morphine injection, ondansetron (ZOFRAN) IV, oxyCODONE, sodium chloride flush, sodium chloride flush, traMADol   Vital Signs    Vitals:   10/16/18 0500 10/16/18 0600 10/16/18 0700 10/16/18 0722  BP: 117/68 (!) 120/91 113/79   Pulse: 89 87 87   Resp: (!) 9 10 13    Temp:    98.2 F (36.8 C)  TempSrc:    Oral  SpO2: 100% 95% 98%   Weight: 111.4 kg     Height:        Intake/Output Summary (Last 24 hours) at 10/16/2018 0806 Last data filed at 10/16/2018 0500 Gross per 24 hour  Intake 920 ml  Output 1300 ml  Net -380 ml   Filed Weights   10/14/18 0500 10/15/18 0500 10/16/18 0500  Weight: 112.5 kg 112.8 kg 111.4 kg    Telemetry     V paced- Personally Reviewed  ECG    No AM EKG - Personally Reviewed  Physical Exam    General: Well developed, well nourished, NAD  HEENT: OP clear, mucus membranes moist  SKIN: warm, dry. No rashes.  Neuro: No focal deficits  Musculoskeletal: Muscle strength 5/5 all ext  Psychiatric: Mood and affect normal  Neck: No JVD, no carotid bruits, no thyromegaly, no lymphadenopathy.  Lungs:Clear bilaterally, no wheezes, rhonci, crackles Cardiovascular: Regular rate and rhythm. No murmurs, gallops or rubs. Abdomen:Soft. Bowel sounds present. Non-tender.  Extremities: No lower extremity edema. Pulses are 2 + in the bilateral DP/PT.    Labs    Chemistry Recent Labs  Lab 10/09/18 1543  10/14/18 0305 10/15/18 0420 10/16/18 0301  NA 135   < > 132* 130* 131*  K 3.5   < > 4.1 4.5 4.1  CL 105   < > 101 97* 96*  CO2 17*   < > 18* 22 25  GLUCOSE 125*   < > 157* 126* 98  BUN 21   < > 26* 36* 33*  CREATININE 1.02   < > 1.02 1.09 0.95  CALCIUM 8.9   < > 8.1* 7.9* 7.6*  PROT 7.0  --   --   --   --   ALBUMIN 4.1  --   --   --   --  AST 18  --   --   --   --   ALT 16  --   --   --   --   ALKPHOS 56  --   --   --   --   BILITOT 0.5  --   --   --   --   GFRNONAA >60   < > >60 >60 >60  GFRAA >60   < > >60 >60 >60  ANIONGAP 13   < > 13 11 10    < > = values in this interval not displayed.     Hematology Recent Labs  Lab 10/14/18 0305 10/15/18 0420 10/16/18 0301  WBC 18.0* 16.5* 17.0*  RBC 3.09* 3.06* 2.93*  HGB 8.9* 8.8* 8.6*  HCT 26.7* 27.2* 26.3*  MCV 86.4 88.9 89.8  MCH 28.8 28.8 29.4  MCHC 33.3 32.4 32.7  RDW 15.9* 15.8* 16.1*  PLT 98* 90* 94*    Cardiac EnzymesNo results for input(s): TROPONINI in the last 168 hours. No results for input(s): TROPIPOC in the last 168 hours.   BNPNo results for input(s): BNP, PROBNP in the last 168 hours.   DDimer No results for input(s): DDIMER in the last 168 hours.   Radiology    Dg Chest Port 1 View  Result Date: 10/15/2018 CLINICAL DATA:  Patient status post aortic valve replacement 10/11/2018. EXAM: PORTABLE CHEST 1 VIEW COMPARISON:  Single-view of the chest 10/14/2018 and 10/13/2018. FINDINGS: Bilateral chest tubes remain in place.  Small left pneumothorax seen on yesterday's examination has resolved. No right pneumothorax. Small left effusion and basilar atelectasis. Right lung clear. Cardiomegaly. Aortic atherosclerosis. IMPRESSION: Small left pneumothorax seen on yesterday's examination has resolved. Left basilar atelectasis and small effusion. Electronically Signed   By: Inge Rise M.D.   On: 10/15/2018 08:44    Cardiac Studies   TEE .  Left ventricle: LV systolic function is low normal. .  Septum: Patent Foramen Ovale present. .  Left atrium: Patent foramen ovale present. .  Aortic valve: The valve is trileaflet. Moderate valve thickening present. Severe valve calcification present. .  Mitral valve:  Moderate to severe regurgitation. .  Right ventricle:  Normal ejection fraction.  Patient Profile     68 y.o. male with a history of aortic stenosis, mitral stenosis, CAD, HLD, previous thymectomy with radiation therapy to the chest, and tobacco abuse who is post MVR, AVR and CABG and has complete heart block.    Assessment & Plan    1. Aortic valve disease/Mitral valve disease/CAD: He is now s/p arotic valve replacement, mitral valve replacement and CABG. Stable night.  He is on ASA and coumadin.   2. Complete heart block: Continue to have heart block. EP to see tomorrow. Likely will get permanent pacemaker tomorrow.       For questions or updates, please contact [place] Please consult [email or web address] for contact info under Cardiology/STEMI.   Signed, Lauree Chandler, MD  10/16/2018, 8:06 AM

## 2018-10-16 NOTE — Plan of Care (Signed)
  Problem: Education: Goal: Knowledge of General Education information will improve Description: Including pain rating scale, medication(s)/side effects and non-pharmacologic comfort measures Outcome: Progressing   Problem: Clinical Measurements: Goal: Will remain free from infection Outcome: Progressing   Problem: Elimination: Goal: Will not experience complications related to bowel motility Outcome: Progressing

## 2018-10-16 NOTE — Progress Notes (Signed)
5 Days Post-Op Procedure(s) (LRB): AORTIC VALVE REPLACEMENT (AVR) with 23 Inspiris Bioprosthetic Aortic valve. (N/A) MITRAL VALVE (MV) REPLACEMENT with 27 Mosaic Bioprosthetic Mitral valve. (N/A) CORONARY ARTERY BYPASS GRAFTING (CABG) x 2 using LIMA to the LAD and endoscopic greater saphenous vein harvesting to the RCA. (N/A) TRANSESOPHAGEAL ECHOCARDIOGRAM (TEE) (N/A) CLIPPING OF LEFT ATRIAL APPENDAGE with 45 AtriCure Clip. (N/A) CLOSURE OF PATENT FORAMEN OVALE (N/A) Subjective: Stable night Still requires AV pacing Patient is n.p.o. for probable permanent pacemaker tomorrow p.m. by Dr. Rayann Heman Otherwise patient walking in hallway on pacemaker, continues with diuresis Oral Coumadin load in progress-INR 1.5 Chest x-ray clear with decreased lung volumes after tubes removed Objective: Vital signs in last 24 hours: Temp:  [96.4 F (35.8 C)-98.2 F (36.8 C)] 97.6 F (36.4 C) (09/07 1115) Pulse Rate:  [34-90] 79 (09/07 1100) Cardiac Rhythm: A-V Sequential paced (09/07 0800) Resp:  [9-28] 11 (09/07 1100) BP: (87-146)/(36-117) 87/55 (09/07 1100) SpO2:  [94 %-100 %] 96 % (09/07 1100) Weight:  [111.4 kg] 111.4 kg (09/07 0500)  Hemodynamic parameters for last 24 hours:  Heart block heart rate 50  Intake/Output from previous day: 09/06 0701 - 09/07 0700 In: 920 [P.O.:900; I.V.:20] Out: 1300 [Urine:1300] Intake/Output this shift: Total I/O In: -  Out: 650 [Urine:650]       Exam    General- alert and comfortable    Neck- no JVD, no cervical adenopathy palpable, no carotid bruit   Lungs- clear without rales, wheezes   Cor- regular rate and rhythm, no murmur , gallop   Abdomen- soft, non-tender   Extremities - warm, non-tender, minimal edema   Neuro- oriented, appropriate, no focal weakness   Lab Results: Recent Labs    10/15/18 0420 10/16/18 0301  WBC 16.5* 17.0*  HGB 8.8* 8.6*  HCT 27.2* 26.3*  PLT 90* 94*   BMET:  Recent Labs    10/15/18 0420 10/16/18 0301  NA 130*  131*  K 4.5 4.1  CL 97* 96*  CO2 22 25  GLUCOSE 126* 98  BUN 36* 33*  CREATININE 1.09 0.95  CALCIUM 7.9* 7.6*    PT/INR:  Recent Labs    10/16/18 0301  LABPROT 18.0*  INR 1.5*   ABG    Component Value Date/Time   PHART 7.349 (L) 10/12/2018 0500   HCO3 19.4 (L) 10/12/2018 0500   TCO2 20 (L) 10/12/2018 0500   ACIDBASEDEF 6.0 (H) 10/12/2018 0500   O2SAT 97.0 10/12/2018 0500   CBG (last 3)  Recent Labs    10/15/18 2203 10/16/18 0644 10/16/18 1113  GLUCAP 114* 104* 129*    Assessment/Plan: S/P Procedure(s) (LRB): AORTIC VALVE REPLACEMENT (AVR) with 23 Inspiris Bioprosthetic Aortic valve. (N/A) MITRAL VALVE (MV) REPLACEMENT with 27 Mosaic Bioprosthetic Mitral valve. (N/A) CORONARY ARTERY BYPASS GRAFTING (CABG) x 2 using LIMA to the LAD and endoscopic greater saphenous vein harvesting to the RCA. (N/A) TRANSESOPHAGEAL ECHOCARDIOGRAM (TEE) (N/A) CLIPPING OF LEFT ATRIAL APPENDAGE with 45 AtriCure Clip. (N/A) CLOSURE OF PATENT FORAMEN OVALE (N/A) Weight still up 8 pounds but BUN 30 so we will reduce Lasix to 1 dose a day Probable permanent pacemaker placement tomorrow by EP, all central lines are out  LOS: 5 days    [name] 10/16/2018

## 2018-10-16 NOTE — Progress Notes (Signed)
Progress Note   Subjective   Doing well today, the patient denies CP or SOB.  He has mad substantial progress since I saw him last week.  No new issues.  Inpatient Medications    Scheduled Meds: . amLODipine  5 mg Oral Daily  . aspirin  81 mg Oral QODAY  . bisacodyl  10 mg Oral Daily   Or  . bisacodyl  10 mg Rectal Daily  . Chlorhexidine Gluconate Cloth  6 each Topical Daily  . docusate sodium  200 mg Oral Daily  . enoxaparin (LOVENOX) injection  30 mg Subcutaneous QHS  . [START ON 10/17/2018] furosemide  40 mg Intravenous Daily  . insulin aspart  0-20 Units Subcutaneous TID WC  . lisinopril  10 mg Oral Daily  . mouth rinse  15 mL Mouth Rinse BID  . pantoprazole  40 mg Oral Daily  . potassium chloride  20 mEq Oral BID  . sodium chloride flush  10-40 mL Intracatheter Q12H  . sodium chloride flush  3 mL Intravenous Q12H  . warfarin  5 mg Oral q1800  . Warfarin - Physician Dosing Inpatient   Does not apply q1800   Continuous Infusions: . sodium chloride    . lactated ringers     PRN Meds: hydrALAZINE, morphine injection, ondansetron (ZOFRAN) IV, oxyCODONE, sodium chloride flush, sodium chloride flush, traMADol   Vital Signs    Vitals:   10/16/18 0700 10/16/18 0722 10/16/18 0800 10/16/18 0913  BP: 113/79  128/87 110/71  Pulse: 87  88   Resp: 13  12   Temp:  98.2 F (36.8 C)    TempSrc:  Oral    SpO2: 98%  98%   Weight:      Height:        Intake/Output Summary (Last 24 hours) at 10/16/2018 0924 Last data filed at 10/16/2018 0500 Gross per 24 hour  Intake 790 ml  Output 1300 ml  Net -510 ml   Filed Weights   10/14/18 0500 10/15/18 0500 10/16/18 0500  Weight: 112.5 kg 112.8 kg 111.4 kg    Telemetry    Atrial flutter, V rates 60s-70s - Personally Reviewed  Physical Exam   GEN- The patient is ill appearing, alert and oriented x 3 today.   Head- normocephalic, atraumatic Eyes-  Sclera clear, conjunctiva pink Ears- hearing intact Oropharynx- clear Neck-  supple, Lungs-   normal work of breathing Heart- irregular rate and rhythm  GI- soft, NT, ND, + BS Extremities- no clubbing, cyanosis, + dependant edema  MS- no significant deformity or atrophy Skin- no rash or lesion Psych- euthymic mood, full affect Neuro- strength and sensation are intact   Labs    Chemistry Recent Labs  Lab 10/09/18 1543  10/14/18 0305 10/15/18 0420 10/16/18 0301  NA 135   < > 132* 130* 131*  K 3.5   < > 4.1 4.5 4.1  CL 105   < > 101 97* 96*  CO2 17*   < > 18* 22 25  GLUCOSE 125*   < > 157* 126* 98  BUN 21   < > 26* 36* 33*  CREATININE 1.02   < > 1.02 1.09 0.95  CALCIUM 8.9   < > 8.1* 7.9* 7.6*  PROT 7.0  --   --   --   --   ALBUMIN 4.1  --   --   --   --   AST 18  --   --   --   --  ALT 16  --   --   --   --   ALKPHOS 56  --   --   --   --   BILITOT 0.5  --   --   --   --   GFRNONAA >60   < > >60 >60 >60  GFRAA >60   < > >60 >60 >60  ANIONGAP 13   < > 13 11 10    < > = values in this interval not displayed.     Hematology Recent Labs  Lab 10/14/18 0305 10/15/18 0420 10/16/18 0301  WBC 18.0* 16.5* 17.0*  RBC 3.09* 3.06* 2.93*  HGB 8.9* 8.8* 8.6*  HCT 26.7* 27.2* 26.3*  MCV 86.4 88.9 89.8  MCH 28.8 28.8 29.4  MCHC 33.3 32.4 32.7  RDW 15.9* 15.8* 16.1*  PLT 98* 90* 94*    Assessment & Plan    1.  AV block Clinically improving Currently in atrial flutter with V rates 60s-70s.  Will continue to follow clinically.  We may be able to avoid pacing. I have reprogrammed from DDD to VVI 40 bpm today.  Epicardial wires are good with threshold today of 0.24mA (checked by me)  2. Atrial flutter Typical He is on ASA and coumadin Could consider pace termination using epicardial wires  I will see again tomorrow  Thompson Grayer MD, [facility] 10/16/2018 9:24 AM

## 2018-10-16 NOTE — Progress Notes (Signed)
CT Surgery PM Rounds  Patient examined and record reviewed.Hemodynamics stable,labs satisfactory.Patient had stable day.Continue current care. [name] 10/16/2018

## 2018-10-17 ENCOUNTER — Encounter (HOSPITAL_COMMUNITY)
Admission: RE | Disposition: A | Discharge status: Home Health | Payer: Self-pay | Source: Home / Self Care | Attending: Thoracic Surgery (Cardiothoracic Vascular Surgery)

## 2018-10-17 HISTORY — PX: PACEMAKER IMPLANT: EP1218

## 2018-10-17 LAB — BASIC METABOLIC PANEL
Anion gap: 8 (ref 5–15)
BUN: 24 mg/dL — ABNORMAL HIGH (ref 8–23)
CO2: 27 mmol/L (ref 22–32)
Calcium: 7.9 mg/dL — ABNORMAL LOW (ref 8.9–10.3)
Chloride: 95 mmol/L — ABNORMAL LOW (ref 98–111)
Creatinine, Ser: 0.83 mg/dL (ref 0.61–1.24)
GFR calc Af Amer: >60 mL/min (ref >60)
GFR calc non Af Amer: >60 mL/min (ref >60)
Glucose, Bld: 98 mg/dL (ref 70–99)
Potassium: 4.2 mmol/L (ref 3.5–5.1)
Sodium: 130 mmol/L — ABNORMAL LOW (ref 135–145)

## 2018-10-17 LAB — SURGICAL PCR SCREEN
MRSA, PCR: NEGATIVE
Staphylococcus aureus: NEGATIVE

## 2018-10-17 LAB — CBC
HCT: 26.2 % — ABNORMAL LOW (ref 39.0–52.0)
Hemoglobin: 8.6 g/dL — ABNORMAL LOW (ref 13.0–17.0)
MCH: 29.4 pg (ref 26.0–34.0)
MCHC: 32.8 g/dL (ref 30.0–36.0)
MCV: 89.4 fL (ref 80.0–100.0)
Platelets: 142 10*3/uL — ABNORMAL LOW (ref 150–400)
RBC: 2.93 MIL/uL — ABNORMAL LOW (ref 4.22–5.81)
RDW: 16.4 % — ABNORMAL HIGH (ref 11.5–15.5)
WBC: 16.6 10*3/uL — ABNORMAL HIGH (ref 4.0–10.5)
nRBC: 2.2 % — ABNORMAL HIGH (ref 0.0–0.2)

## 2018-10-17 LAB — GLUCOSE, CAPILLARY
Glucose-Capillary: 152 mg/dL — ABNORMAL HIGH (ref 70–99)
Glucose-Capillary: 105 mg/dL — ABNORMAL HIGH (ref 70–99)
Glucose-Capillary: 111 mg/dL — ABNORMAL HIGH (ref 70–99)
Glucose-Capillary: 120 mg/dL — ABNORMAL HIGH (ref 70–99)

## 2018-10-17 LAB — PROTIME-INR
INR: 1.5 — ABNORMAL HIGH (ref 0.8–1.2)
Prothrombin Time: 18.1 seconds — ABNORMAL HIGH (ref 11.4–15.2)

## 2018-10-17 SURGERY — PACEMAKER IMPLANT

## 2018-10-17 MED ORDER — SODIUM CHLORIDE 0.9 % IV SOLN: 250.0000 mL | INTRAVENOUS | Status: DC | PRN

## 2018-10-17 MED ORDER — SODIUM CHLORIDE 0.9 % IV SOLN
INTRAVENOUS | Status: AC
  Filled 2018-10-17: qty 2

## 2018-10-17 MED ORDER — SODIUM CHLORIDE 0.9 % IV SOLN
INTRAVENOUS | Status: DC
  Administered 2018-10-17: 12:00:00 via INTRAVENOUS

## 2018-10-17 MED ORDER — MIDAZOLAM HCL 5 MG/5ML IJ SOLN
INTRAMUSCULAR | Status: DC | PRN
  Administered 2018-10-17: 1 mg via INTRAVENOUS

## 2018-10-17 MED ORDER — HEPARIN (PORCINE) IN NACL 1000-0.9 UT/500ML-% IV SOLN
INTRAVENOUS | Status: AC
  Filled 2018-10-17: qty 500

## 2018-10-17 MED ORDER — LIDOCAINE HCL (PF) 1 % IJ SOLN
INTRAMUSCULAR | Status: DC | PRN
  Administered 2018-10-17: 60 mL

## 2018-10-17 MED ORDER — SODIUM CHLORIDE 0.9% FLUSH: 3.0000 mL | INTRAVENOUS | Status: DC | PRN

## 2018-10-17 MED ORDER — ATORVASTATIN CALCIUM 10 MG PO TABS
20.0000 mg | ORAL_TABLET | Freq: Every day | ORAL | Status: DC
  Administered 2018-10-17 – 2018-10-19 (×3): 20 mg via ORAL
  Filled 2018-10-17 (×3): qty 2

## 2018-10-17 MED ORDER — CEFAZOLIN SODIUM-DEXTROSE 2-4 GM/100ML-% IV SOLN
2.0000 g | INTRAVENOUS | Status: AC
  Administered 2018-10-17: 2 g via INTRAVENOUS

## 2018-10-17 MED ORDER — HEPARIN (PORCINE) IN NACL 1000-0.9 UT/500ML-% IV SOLN
INTRAVENOUS | Status: DC | PRN
  Administered 2018-10-17: 500 mL

## 2018-10-17 MED ORDER — ONDANSETRON HCL 4 MG/2ML IJ SOLN: 4.0000 mg | Freq: Four times a day (QID) | INTRAMUSCULAR | Status: DC | PRN

## 2018-10-17 MED ORDER — CEFAZOLIN SODIUM-DEXTROSE 2-4 GM/100ML-% IV SOLN
INTRAVENOUS | Status: AC
  Filled 2018-10-17: qty 100

## 2018-10-17 MED ORDER — SODIUM CHLORIDE 0.9 % IV SOLN
80.0000 mg | INTRAVENOUS | Status: AC
  Administered 2018-10-17: 80 mg

## 2018-10-17 MED ORDER — CEFAZOLIN SODIUM-DEXTROSE 1-4 GM/50ML-% IV SOLN
1.0000 g | Freq: Four times a day (QID) | INTRAVENOUS | Status: AC
  Administered 2018-10-17 – 2018-10-18 (×3): 1 g via INTRAVENOUS
  Filled 2018-10-17 (×3): qty 50

## 2018-10-17 MED ORDER — LIDOCAINE HCL (PF) 1 % IJ SOLN
INTRAMUSCULAR | Status: AC
  Filled 2018-10-17: qty 60

## 2018-10-17 MED ORDER — MIDAZOLAM HCL 5 MG/5ML IJ SOLN
INTRAMUSCULAR | Status: AC
  Filled 2018-10-17: qty 5

## 2018-10-17 MED ORDER — SODIUM CHLORIDE 0.9% FLUSH
3.0000 mL | Freq: Two times a day (BID) | INTRAVENOUS | Status: DC
  Administered 2018-10-18 – 2018-10-19 (×4): 3 mL via INTRAVENOUS

## 2018-10-17 MED ORDER — FENTANYL CITRATE (PF) 100 MCG/2ML IJ SOLN
INTRAMUSCULAR | Status: AC
  Filled 2018-10-17: qty 2

## 2018-10-17 MED FILL — Lidocaine HCl Local Soln Prefilled Syringe 100 MG/5ML (2%): INTRAMUSCULAR | Qty: 5 | Status: AC

## 2018-10-17 MED FILL — Sodium Chloride IV Soln 0.9%: INTRAVENOUS | Qty: 2000 | Status: AC

## 2018-10-17 MED FILL — Mannitol IV Soln 20%: INTRAVENOUS | Qty: 500 | Status: AC

## 2018-10-17 MED FILL — Sodium Bicarbonate IV Soln 8.4%: INTRAVENOUS | Qty: 50 | Status: AC

## 2018-10-17 MED FILL — Electrolyte-R (PH 7.4) Solution: INTRAVENOUS | Qty: 4000 | Status: AC

## 2018-10-17 MED FILL — Albumin, Human Inj 5%: INTRAVENOUS | Qty: 250 | Status: AC

## 2018-10-17 MED FILL — Heparin Sodium (Porcine) Inj 1000 Unit/ML: INTRAMUSCULAR | Qty: 10 | Status: AC

## 2018-10-17 SURGICAL SUPPLY — 13 items
CABLE SURGICAL S-101-97-12 (CABLE) ×3 IMPLANT
CATH RIGHTSITE C315HIS02 (CATHETERS) ×2 IMPLANT
IPG PACE AZUR XT DR MRI W1DR01 (Pacemaker) IMPLANT
LEAD CAPSURE NOVUS 5076-52CM (Lead) ×2 IMPLANT
LEAD SELECT SECURE 3830 383069 (Lead) IMPLANT
PACE AZURE XT DR MRI W1DR01 (Pacemaker) ×3 IMPLANT
PAD PRO RADIOLUCENT 2001M-C (PAD) ×3 IMPLANT
SELECT SECURE 3830 383069 (Lead) ×3 IMPLANT
SHEATH 7FR PRELUDE SNAP 13 (SHEATH) ×4 IMPLANT
SHEATH PINNACLE 7F 10CM (SHEATH) IMPLANT
SLITTER 6232ADJ (MISCELLANEOUS) ×2 IMPLANT
TRAY PACEMAKER INSERTION (PACKS) ×3 IMPLANT
WIRE HI TORQ VERSACORE-J 145CM (WIRE) ×2 IMPLANT

## 2018-10-17 NOTE — Progress Notes (Signed)
Orthopedic Tech Progress Note Patient Details:  [name] 03-Aug-1950 [id number] RN said patient has arm sling Patient ID: [name], male   DOB: 1950-12-01, 68 y.o.   MRN: [id number]   Janit Pagan 10/17/2018, 4:13 PM

## 2018-10-17 NOTE — Progress Notes (Addendum)
Electrophysiology Rounding Note  Patient Name: [name] Date of Encounter: 10/17/2018  Primary Cardiologist: Dr. Angelena Form Electrophysiologist: New to Dr. Rayann Heman   Subjective   Feeling Ok this am.   Remains in AFlutter underlying with junctional escape this am. BP dropped last night and temp pacer turned back up to 90 bpm.     Inpatient Medications    Scheduled Meds: . amLODipine  5 mg Oral Daily  . aspirin  81 mg Oral QODAY  . bisacodyl  10 mg Oral Daily   Or  . bisacodyl  10 mg Rectal Daily  . Chlorhexidine Gluconate Cloth  6 each Topical Daily  . docusate sodium  200 mg Oral Daily  . enoxaparin (LOVENOX) injection  30 mg Subcutaneous QHS  . furosemide  40 mg Intravenous Daily  . insulin aspart  0-20 Units Subcutaneous TID WC  . lisinopril  10 mg Oral Daily  . mouth rinse  15 mL Mouth Rinse BID  . pantoprazole  40 mg Oral Daily  . sodium chloride flush  10-40 mL Intracatheter Q12H  . sodium chloride flush  3 mL Intravenous Q12H  . warfarin  5 mg Oral q1800  . Warfarin - Physician Dosing Inpatient   Does not apply q1800   Continuous Infusions: . sodium chloride    . lactated ringers     PRN Meds: hydrALAZINE, morphine injection, ondansetron (ZOFRAN) IV, oxyCODONE, sodium chloride flush, sodium chloride flush, traMADol   Vital Signs    Vitals:   10/17/18 0400 10/17/18 0500 10/17/18 0600 10/17/18 0700  BP: 99/71 103/63 129/73 114/69  Pulse: 89 90  90  Resp: (!) 9 13 16 20   Temp:      TempSrc:      SpO2: 96% 100%  98%  Weight:  111.3 kg    Height:        Intake/Output Summary (Last 24 hours) at 10/17/2018 0741 Last data filed at 10/17/2018 0200 Gross per 24 hour  Intake 10 ml  Output 1325 ml  Net -1315 ml   Filed Weights   10/15/18 0500 10/16/18 0500 10/17/18 0500  Weight: 112.8 kg 111.4 kg 111.3 kg    Physical Exam    GEN- The patient is well appearing, alert and oriented x 3 today.   Head- normocephalic, atraumatic Eyes-  Sclera clear,  conjunctiva pink Ears- hearing intact Oropharynx- clear Neck- supple Lungs- Clear to ausculation bilaterally, normal work of breathing Heart- Regular rate and rhythm, no murmurs, rubs or gallops GI- soft, NT, ND, + BS Extremities- no clubbing, cyanosis, or edema Skin- no rash or lesion Psych- euthymic mood, full affect Neuro- strength and sensation are intact  Labs    CBC Recent Labs    10/16/18 0301 10/17/18 0412  WBC 17.0* 16.6*  HGB 8.6* 8.6*  HCT 26.3* 26.2*  MCV 89.8 89.4  PLT 94* [id number]*   Basic Metabolic Panel Recent Labs    10/16/18 0301 10/17/18 0412  NA 131* 130*  K 4.1 4.2  CL 96* 95*  CO2 25 27  GLUCOSE 98 98  BUN 33* 24*  CREATININE 0.95 0.83  CALCIUM 7.6* 7.9*   Liver Function Tests No results for input(s): AST, ALT, ALKPHOS, BILITOT, PROT, ALBUMIN in the last 72 hours. No results for input(s): LIPASE, AMYLASE in the last 72 hours. Cardiac Enzymes No results for input(s): CKTOTAL, CKMB, CKMBINDEX, TROPONINI in the last 72 hours. BNP Invalid input(s): POCBNP D-Dimer No results for input(s): DDIMER in the last 72 hours. Hemoglobin A1C  No results for input(s): HGBA1C in the last 72 hours. Fasting Lipid Panel No results for input(s): CHOL, HDL, LDLCALC, TRIG, CHOLHDL, LDLDIRECT in the last 72 hours. Thyroid Function Tests No results for input(s): TSH, T4TOTAL, T3FREE, THYROIDAB in the last 72 hours.  Invalid input(s): FREET3  Telemetry    V paced 90, underlying Aflutter when turned down with variable conduction (personally reviewed)  Radiology    Dg Chest Port 1 View  Result Date: 10/16/2018 CLINICAL DATA:  F/u post MVR surgery - pt states not feeling well still (did not elaborate) EXAM: PORTABLE CHEST 1 VIEW COMPARISON:  10/15/2018 FINDINGS: Median sternotomy, valve replacement and atrial appendage clip, stable in appearance. Bilateral chest tubes and mediastinal drain have been removed. Suspect small RIGHT pneumothorax, approximately 5% of lung  volume. No LEFT-sided pneumothorax. There is persistent obscuration of the LEFT hemidiaphragm consistent with atelectasis or LEFT LOWER lobe infiltrate. IMPRESSION: Small RIGHT pneumothorax following removal of bilateral chest tubes. Persistent opacification at the LEFT lung base. Electronically Signed   By: Nolon Nations M.D.   On: 10/16/2018 09:15     Patient Profile     [name] is a 68 y.o. male with a history of aortic stenosis, mitral stenosis, CAD, HLD, previous thymectomy with radiation therapy to the chest, and tobacco abuse who is seen for the evaluation of CHB s/p AV replacement, MV replacement, and CABG at the request of Dr. Roxy Manns.  Assessment & Plan    1. AV block Conduction has waxed and waned. Yesterday was conduction in AFL with V rates in 60-70s. Poor conduction this am with junctional escape under pacing.   2. Atrial flutter Typical He is on ASA and coumadin Will discuss with Dr. Rayann Heman. May consider pace termination prior to PPM consideration. If conducting when in sinus, may be able to defer pacing for now.   For questions or updates, please contact [place] Please consult [email or web address] for contact info under Cardiology/STEMI.  Signed, Shirley Friar, PA-C  10/17/2018, 7:41 AM    I have seen, examined the patient, and reviewed the above assessment and plan.  Changes to above are made where necessary.  On exam, RRR (paced) He continues to have symptomatic bradycardia requiring pacing.  I spoke with Dr Roxy Manns this am and we agree that pacing is indicated at this time.  The patient has symptomatic bradycardia.  I would therefore recommend pacemaker implantation at this time.  Risks, benefits, alternatives to pacemaker implantation were discussed in detail with the patient today. The patient understands that the risks include but are not limited to bleeding, infection, pneumothorax, perforation, tamponade, vascular damage, renal failure, MI, stroke,  death,  and lead dislodgement and wishes to proceed. We will therefore schedule the procedure at the next available time.  We also discussed remote monitoring and its role today.   Co Sign: Thompson Grayer, MD 10/17/2018 1:02 PM

## 2018-10-17 NOTE — H&P (View-Only) (Signed)
Electrophysiology Rounding Note  Patient Name: ERIKSEN SABATELLI Date of Encounter: 10/17/2018  Primary Cardiologist: Dr. Angelena Form Electrophysiologist: New to Dr. Rayann Heman   Subjective   Feeling Ok this am.   Remains in AFlutter underlying with junctional escape this am. BP dropped last night and temp pacer turned back up to 90 bpm.     Inpatient Medications    Scheduled Meds: . amLODipine  5 mg Oral Daily  . aspirin  81 mg Oral QODAY  . bisacodyl  10 mg Oral Daily   Or  . bisacodyl  10 mg Rectal Daily  . Chlorhexidine Gluconate Cloth  6 each Topical Daily  . docusate sodium  200 mg Oral Daily  . enoxaparin (LOVENOX) injection  30 mg Subcutaneous QHS  . furosemide  40 mg Intravenous Daily  . insulin aspart  0-20 Units Subcutaneous TID WC  . lisinopril  10 mg Oral Daily  . mouth rinse  15 mL Mouth Rinse BID  . pantoprazole  40 mg Oral Daily  . sodium chloride flush  10-40 mL Intracatheter Q12H  . sodium chloride flush  3 mL Intravenous Q12H  . warfarin  5 mg Oral q1800  . Warfarin - Physician Dosing Inpatient   Does not apply q1800   Continuous Infusions: . sodium chloride    . lactated ringers     PRN Meds: hydrALAZINE, morphine injection, ondansetron (ZOFRAN) IV, oxyCODONE, sodium chloride flush, sodium chloride flush, traMADol   Vital Signs    Vitals:   10/17/18 0400 10/17/18 0500 10/17/18 0600 10/17/18 0700  BP: 99/71 103/63 129/73 114/69  Pulse: 89 90  90  Resp: (!) 9 13 16 20   Temp:      TempSrc:      SpO2: 96% 100%  98%  Weight:  111.3 kg    Height:        Intake/Output Summary (Last 24 hours) at 10/17/2018 0741 Last data filed at 10/17/2018 0200 Gross per 24 hour  Intake 10 ml  Output 1325 ml  Net -1315 ml   Filed Weights   10/15/18 0500 10/16/18 0500 10/17/18 0500  Weight: 112.8 kg 111.4 kg 111.3 kg    Physical Exam    GEN- The patient is well appearing, alert and oriented x 3 today.   Head- normocephalic, atraumatic Eyes-  Sclera clear,  conjunctiva pink Ears- hearing intact Oropharynx- clear Neck- supple Lungs- Clear to ausculation bilaterally, normal work of breathing Heart- Regular rate and rhythm, no murmurs, rubs or gallops GI- soft, NT, ND, + BS Extremities- no clubbing, cyanosis, or edema Skin- no rash or lesion Psych- euthymic mood, full affect Neuro- strength and sensation are intact  Labs    CBC Recent Labs    10/16/18 0301 10/17/18 0412  WBC 17.0* 16.6*  HGB 8.6* 8.6*  HCT 26.3* 26.2*  MCV 89.8 89.4  PLT 94* [id number]*   Basic Metabolic Panel Recent Labs    10/16/18 0301 10/17/18 0412  NA 131* 130*  K 4.1 4.2  CL 96* 95*  CO2 25 27  GLUCOSE 98 98  BUN 33* 24*  CREATININE 0.95 0.83  CALCIUM 7.6* 7.9*   Liver Function Tests No results for input(s): AST, ALT, ALKPHOS, BILITOT, PROT, ALBUMIN in the last 72 hours. No results for input(s): LIPASE, AMYLASE in the last 72 hours. Cardiac Enzymes No results for input(s): CKTOTAL, CKMB, CKMBINDEX, TROPONINI in the last 72 hours. BNP Invalid input(s): POCBNP D-Dimer No results for input(s): DDIMER in the last 72 hours. Hemoglobin A1C  No results for input(s): HGBA1C in the last 72 hours. Fasting Lipid Panel No results for input(s): CHOL, HDL, LDLCALC, TRIG, CHOLHDL, LDLDIRECT in the last 72 hours. Thyroid Function Tests No results for input(s): TSH, T4TOTAL, T3FREE, THYROIDAB in the last 72 hours.  Invalid input(s): FREET3  Telemetry    V paced 90, underlying Aflutter when turned down with variable conduction (personally reviewed)  Radiology    Dg Chest Port 1 View  Result Date: 10/16/2018 CLINICAL DATA:  F/u post MVR surgery - pt states not feeling well still (did not elaborate) EXAM: PORTABLE CHEST 1 VIEW COMPARISON:  10/15/2018 FINDINGS: Median sternotomy, valve replacement and atrial appendage clip, stable in appearance. Bilateral chest tubes and mediastinal drain have been removed. Suspect small RIGHT pneumothorax, approximately 5% of lung  volume. No LEFT-sided pneumothorax. There is persistent obscuration of the LEFT hemidiaphragm consistent with atelectasis or LEFT LOWER lobe infiltrate. IMPRESSION: Small RIGHT pneumothorax following removal of bilateral chest tubes. Persistent opacification at the LEFT lung base. Electronically Signed   By: Nolon Nations M.D.   On: 10/16/2018 09:15     Patient Profile     LINKIN GERGES is a 68 y.o. male with a history of aortic stenosis, mitral stenosis, CAD, HLD, previous thymectomy with radiation therapy to the chest, and tobacco abuse who is seen for the evaluation of CHB s/p AV replacement, MV replacement, and CABG at the request of Dr. Roxy Manns.  Assessment & Plan    1. AV block Conduction has waxed and waned. Yesterday was conduction in AFL with V rates in 60-70s. Poor conduction this am with junctional escape under pacing.   2. Atrial flutter Typical He is on ASA and coumadin Will discuss with Dr. Rayann Heman. May consider pace termination prior to PPM consideration. If conducting when in sinus, may be able to defer pacing for now.   For questions or updates, please contact [place] Please consult [email or web address] for contact info under Cardiology/STEMI.  Signed, Shirley Friar, PA-C  10/17/2018, 7:41 AM    I have seen, examined the patient, and reviewed the above assessment and plan.  Changes to above are made where necessary.  On exam, RRR (paced) He continues to have symptomatic bradycardia requiring pacing.  I spoke with Dr Roxy Manns this am and we agree that pacing is indicated at this time.  The patient has symptomatic bradycardia.  I would therefore recommend pacemaker implantation at this time.  Risks, benefits, alternatives to pacemaker implantation were discussed in detail with the patient today. The patient understands that the risks include but are not limited to bleeding, infection, pneumothorax, perforation, tamponade, vascular damage, renal failure, MI, stroke,  death,  and lead dislodgement and wishes to proceed. We will therefore schedule the procedure at the next available time.  We also discussed remote monitoring and its role today.   Co Sign: Thompson Grayer, MD 10/17/2018 1:02 PM

## 2018-10-17 NOTE — Plan of Care (Signed)
  Problem: Education: Goal: Knowledge of General Education information will improve Description: Including pain rating scale, medication(s)/side effects and non-pharmacologic comfort measures Outcome: Progressing   Problem: Clinical Measurements: Goal: Will remain free from infection Outcome: Progressing   Problem: Clinical Measurements: Goal: Respiratory complications will improve Outcome: Progressing   

## 2018-10-17 NOTE — Progress Notes (Addendum)
[address]       [place] [address]             [phone]      6 Days Post-Op Procedure(s) (LRB): AORTIC VALVE REPLACEMENT (AVR) with 23 Inspiris Bioprosthetic Aortic valve. (N/A) MITRAL VALVE (MV) REPLACEMENT with 27 Mosaic Bioprosthetic Mitral valve. (N/A) CORONARY ARTERY BYPASS GRAFTING (CABG) x 2 using LIMA to the LAD and endoscopic greater saphenous vein harvesting to the RCA. (N/A) TRANSESOPHAGEAL ECHOCARDIOGRAM (TEE) (N/A) CLIPPING OF LEFT ATRIAL APPENDAGE with 45 AtriCure Clip. (N/A) CLOSURE OF PATENT FORAMEN OVALE (N/A)   Subjective:  Patient up in chair, wanting to eat breakfast. He is complaining about wearing oxygen wants it removed. Per nursing patient's HR dropped into the 50s yesterday evening with drop in BP.Marland Kitchen Required pacer to be placed back on DDD at 90  Objective: Vital signs in last 24 hours: Temp:  [96.5 F (35.8 C)-98 F (36.7 C)] 97.8 F (36.6 C) (09/08 0352) Pulse Rate:  [55-90] 90 (09/08 0700) Cardiac Rhythm: A-V Sequential paced (09/08 0400) Resp:  [9-28] 20 (09/08 0700) BP: (87-129)/(36-87) 114/69 (09/08 0700) SpO2:  [93 %-100 %] 98 % (09/08 0700) Weight:  [111.3 kg] 111.3 kg (09/08 0500)  Intake/Output from previous day: 09/07 0701 - 09/08 0700 In: 10 [I.V.:10] Out: 1325 [Urine:1325]  General appearance: alert, cooperative and no distress Heart: regularly irregular rhythm Lungs: clear to auscultation bilaterally Abdomen: soft, non-tender; bowel sounds normal; no masses,  no organomegaly Extremities: edema trace Wound: clean and dry  Lab Results: Recent Labs    10/16/18 0301 10/17/18 0412  WBC 17.0* 16.6*  HGB 8.6* 8.6*  HCT 26.3* 26.2*  PLT 94* 142*   BMET:  Recent Labs    10/16/18 0301 10/17/18 0412  NA 131* 130*  K 4.1 4.2  CL 96* 95*  CO2 25 27  GLUCOSE 98 98  BUN 33* 24*  CREATININE 0.95 0.83  CALCIUM 7.6* 7.9*    PT/INR:  Recent Labs    10/17/18 0412  LABPROT 18.1*  INR 1.5*   ABG    Component Value Date/Time   PHART 7.349 (L) 10/12/2018 0500   HCO3 19.4 (L) 10/12/2018 0500   TCO2 20 (L) 10/12/2018 0500   ACIDBASEDEF 6.0 (H) 10/12/2018 0500   O2SAT 97.0 10/12/2018 0500   CBG (last 3)  Recent Labs    10/16/18 1552 10/16/18 2157 10/17/18 0644  GLUCAP 116* 120* 105*    Assessment/Plan: S/P Procedure(s) (LRB): AORTIC VALVE REPLACEMENT (AVR) with 23 Inspiris Bioprosthetic Aortic valve. (N/A) MITRAL VALVE (MV) REPLACEMENT with 27 Mosaic Bioprosthetic Mitral valve. (N/A) CORONARY ARTERY BYPASS GRAFTING (CABG) x 2 using LIMA to the LAD and endoscopic greater saphenous vein harvesting to the RCA. (N/A) TRANSESOPHAGEAL ECHOCARDIOGRAM (TEE) (N/A) CLIPPING OF LEFT ATRIAL APPENDAGE with 45 AtriCure Clip. (N/A) CLOSURE OF PATENT FORAMEN OVALE (N/A)  1. CV- Atrial Flutter under pacer, Dr. Prescott Gum attempted to RAP- EP is following, continue to avoid nodal blocking agents 2. INR consistently 1.5, received 5 mg coumadin last night, will repeat dose this evening, 3. Pulm- no acute issues, wean oxygen as able, continue IS 4. Renal-creatinine WNL, weight remains stable, continue diuretics, K is WNL 5. Expected thrombocytopenia, improving up to 142 6. Dispo- patient stable, A. Flutter under pacer, EP is following, may be able to avoid PPM, patient is not responding to coumadin, will plan to repeat coumadin at 5 mg daily, if no response will increase to 7.5 mg tomorrow, continue  diuretics, thrombocytopenia resolved, continue current care   LOS: 6 days   Ellwood Handler PA-C [id number]  10/17/2018   I have seen and examined the patient and agree with the assessment and plan as outlined.  Still in [place].  Attempted to RAP patient out of Aflutter but unsuccessful after multiple attempts.  I favor proceeding with PPM placement today.  Okay for transfer to 4E progressive care.  Rexene Alberts, MD 10/17/2018 10:16 AM

## 2018-10-17 NOTE — Progress Notes (Signed)
      [address]       [place] [address]             [phone]    S/p pacemaker  Sitting up, no complaints Awaiting bed on 4E  Steven C. Roxan Hockey, MD [facility] [phone]

## 2018-10-17 NOTE — Interval H&P Note (Signed)
History and Physical Interval Note:  10/17/2018 1:05 PM  [name]  has presented today for surgery, with the diagnosis of Complete Heart Block.  The various methods of treatment have been discussed with the patient and family. After consideration of risks, benefits and other options for treatment, the patient has consented to  Procedure(s): PACEMAKER IMPLANT (N/A) as a surgical intervention.  The patient's history has been reviewed, patient examined, no change in status, stable for surgery.  I have reviewed the patient's chart and labs.  Questions were answered to the patient's satisfaction.     Thompson Grayer

## 2018-10-18 ENCOUNTER — Inpatient Hospital Stay (HOSPITAL_COMMUNITY): Payer: Medicare HMO

## 2018-10-18 ENCOUNTER — Encounter (HOSPITAL_COMMUNITY): Payer: Self-pay | Admitting: Internal Medicine

## 2018-10-18 LAB — BASIC METABOLIC PANEL
Anion gap: 12 (ref 5–15)
BUN: 23 mg/dL (ref 8–23)
CO2: 24 mmol/L (ref 22–32)
Calcium: 7.7 mg/dL — ABNORMAL LOW (ref 8.9–10.3)
Chloride: 94 mmol/L — ABNORMAL LOW (ref 98–111)
Creatinine, Ser: 0.86 mg/dL (ref 0.61–1.24)
GFR calc Af Amer: >60 mL/min (ref >60)
GFR calc non Af Amer: >60 mL/min (ref >60)
Glucose, Bld: 112 mg/dL — ABNORMAL HIGH (ref 70–99)
Potassium: 4.6 mmol/L (ref 3.5–5.1)
Sodium: 130 mmol/L — ABNORMAL LOW (ref 135–145)

## 2018-10-18 LAB — PROTIME-INR
INR: 1.7 — ABNORMAL HIGH (ref 0.8–1.2)
Prothrombin Time: 19.9 seconds — ABNORMAL HIGH (ref 11.4–15.2)

## 2018-10-18 LAB — CBC
HCT: 28.1 % — ABNORMAL LOW (ref 39.0–52.0)
Hemoglobin: 9.2 g/dL — ABNORMAL LOW (ref 13.0–17.0)
MCH: 28.8 pg (ref 26.0–34.0)
MCHC: 32.7 g/dL (ref 30.0–36.0)
MCV: 87.8 fL (ref 80.0–100.0)
Platelets: 122 10*3/uL — ABNORMAL LOW (ref 150–400)
RBC: 3.2 MIL/uL — ABNORMAL LOW (ref 4.22–5.81)
RDW: 16.8 % — ABNORMAL HIGH (ref 11.5–15.5)
WBC: 16.8 10*3/uL — ABNORMAL HIGH (ref 4.0–10.5)
nRBC: 2.5 % — ABNORMAL HIGH (ref 0.0–0.2)

## 2018-10-18 LAB — GLUCOSE, CAPILLARY
Glucose-Capillary: 98 mg/dL (ref 70–99)
Glucose-Capillary: 127 mg/dL — ABNORMAL HIGH (ref 70–99)
Glucose-Capillary: 119 mg/dL — ABNORMAL HIGH (ref 70–99)

## 2018-10-18 MED ORDER — POTASSIUM CHLORIDE CRYS ER 20 MEQ PO TBCR
20.0000 meq | EXTENDED_RELEASE_TABLET | Freq: Every day | ORAL | Status: DC
  Administered 2018-10-18 – 2018-10-20 (×3): 20 meq via ORAL
  Filled 2018-10-18 (×3): qty 1

## 2018-10-18 MED ORDER — FUROSEMIDE 40 MG PO TABS
40.0000 mg | ORAL_TABLET | Freq: Every day | ORAL | Status: DC
  Administered 2018-10-18 – 2018-10-20 (×3): 40 mg via ORAL
  Filled 2018-10-18 (×3): qty 1

## 2018-10-18 MED ORDER — WARFARIN SODIUM 7.5 MG PO TABS
7.5000 mg | ORAL_TABLET | Freq: Every day | ORAL | Status: DC
  Administered 2018-10-18 – 2018-10-19 (×2): 7.5 mg via ORAL
  Filled 2018-10-18 (×2): qty 1

## 2018-10-18 MED FILL — Fentanyl Citrate Preservative Free (PF) Inj 100 MCG/2ML: INTRAMUSCULAR | Qty: 2 | Status: AC

## 2018-10-18 NOTE — Progress Notes (Signed)
Pacing wires removed at [id number] without complication. VSS. Vital signs set to cycle q15 minutes. Will continue to monitor.

## 2018-10-18 NOTE — Progress Notes (Addendum)
Electrophysiology Rounding Note  Patient Name: [name] Date of Encounter: 10/18/2018  Primary Cardiologist: Dr. Angelena Form Electrophysiologist: Dr. Rayann Heman   Subjective   S/p MDT dual chamber PPM 10/17/2018  CXR with stable leads and no pneumothorax.   The patient is doing well today.  At this time, the patient denies chest pain, shortness of breath, or any new concerns. Continues to recover from CABG/AVR/MVR.  Inpatient Medications    Scheduled Meds: . amLODipine  5 mg Oral Daily  . aspirin  81 mg Oral QODAY  . atorvastatin  20 mg Oral q1800  . bisacodyl  10 mg Oral Daily   Or  . bisacodyl  10 mg Rectal Daily  . Chlorhexidine Gluconate Cloth  6 each Topical Daily  . docusate sodium  200 mg Oral Daily  . furosemide  40 mg Intravenous Daily  . insulin aspart  0-20 Units Subcutaneous TID WC  . lisinopril  10 mg Oral Daily  . pantoprazole  40 mg Oral Daily  . sodium chloride flush  3 mL Intravenous Q12H  . warfarin  5 mg Oral q1800  . Warfarin - Physician Dosing Inpatient   Does not apply q1800   Continuous Infusions: . sodium chloride    .  ceFAZolin (ANCEF) IV Stopped (10/18/18 0343)   PRN Meds: sodium chloride, hydrALAZINE, morphine injection, ondansetron (ZOFRAN) IV, oxyCODONE, sodium chloride flush, traMADol   Vital Signs    Vitals:   10/18/18 0300 10/18/18 0400 10/18/18 0500 10/18/18 0605  BP: 109/86 120/65 133/64 136/64  Pulse: (!) 59 (!) 56 (!) 59 63  Resp: 15 13 15 20   Temp:      TempSrc:      SpO2: 94% 92% 95% 97%  Weight:    110.5 kg  Height:        Intake/Output Summary (Last 24 hours) at 10/18/2018 0655 Last data filed at 10/18/2018 0600 Gross per 24 hour  Intake 483.23 ml  Output 1560 ml  Net -1076.77 ml   Filed Weights   10/16/18 0500 10/17/18 0500 10/18/18 0605  Weight: 111.4 kg 111.3 kg 110.5 kg    Physical Exam    GEN- The patient is well appearing, alert and oriented x 3 today.   Head- normocephalic, atraumatic Eyes-  Sclera  clear, conjunctiva pink Ears- hearing intact Oropharynx- clear Neck- supple Lungs- Clear to ausculation bilaterally, normal work of breathing Heart- Regular rate and rhythm, no murmurs, rubs or gallops GI- soft, NT, ND, + BS Extremities- no clubbing, cyanosis, or edema Skin- no rash or lesion. Sternotomy wound C/D/I Pacer site C/D/I. No hematoma. Psych- euthymic mood, full affect Neuro- strength and sensation are intact  Labs    CBC Recent Labs    10/17/18 0412 10/18/18 0217  WBC 16.6* 16.8*  HGB 8.6* 9.2*  HCT 26.2* 28.1*  MCV 89.4 87.8  PLT 142* 123XX123*   Basic Metabolic Panel Recent Labs    10/17/18 0412 10/18/18 0217  NA 130* 130*  K 4.2 4.6  CL 95* 94*  CO2 27 24  GLUCOSE 98 112*  BUN 24* 23  CREATININE 0.83 0.86  CALCIUM 7.9* 7.7*   Liver Function Tests No results for input(s): AST, ALT, ALKPHOS, BILITOT, PROT, ALBUMIN in the last 72 hours. No results for input(s): LIPASE, AMYLASE in the last 72 hours. Cardiac Enzymes No results for input(s): CKTOTAL, CKMB, CKMBINDEX, TROPONINI in the last 72 hours. BNP Invalid input(s): POCBNP D-Dimer No results for input(s): DDIMER in the last 72 hours. Hemoglobin A1C  No results for input(s): HGBA1C in the last 72 hours. Fasting Lipid Panel No results for input(s): CHOL, HDL, LDLCALC, TRIG, CHOLHDL, LDLDIRECT in the last 72 hours. Thyroid Function Tests No results for input(s): TSH, T4TOTAL, T3FREE, THYROIDAB in the last 72 hours.  Invalid input(s): FREET3  Telemetry    V paced at 60 for the majority of the night, ~ 0630 started conducting his AFL 3:1; 4:1 at times, Rates overall controlled.(personally reviewed)  Radiology    No results found.   Patient Profile     [name] a 68 y.o.malewith a history of aortic stenosis, mitral stenosis, CAD, HLD, previous thymectomy with radiation therapy to the chest, and tobacco abusewho is seen for the evaluation of CHB s/p AV replacement, MV replacement, and  CABGat the request ofDr. Roxy Manns.  Assessment & Plan    1. Advanced AV block s/p Medtronic dual chamber PPM 10/17/2018 Stable CXR this am Stable device function on interrogation  Follow up in and instructions reviewed with patient.   2. Typical Atrial flutter Pt on ASA and coumadin with CHA2DS2/VASc is at least 3 ( Age 70, HTN, vascular disease)  Rate overall well controlled.  Will plan to re-assess at patients wound check in several weeks, and consider DCCV if remains out of rhythm.   For questions or updates, please contact [place] Please consult [email or web address] for contact info under Cardiology/STEMI.  Signed, Shirley Friar, PA-C  10/18/2018, 6:55 AM     I have seen, examined the patient, and reviewed the above assessment and plan.  Changes to above are made where necessary.  On exam, RRR. No hematoma.  Device pocket looks good.  CXR reveals stable leads, no ptx.  Device interrogation is reviewed and normal  Remains in atrial flutter but rate controlled Continue initiation of anticoagulation. We will consider additional rhythm management at time of device wound check  Electrophysiology team to see as needed while here. Please call with questions.   Co Sign: Thompson Grayer, MD

## 2018-10-18 NOTE — Progress Notes (Signed)
[facility] BRIEF SICU PROGRESS NOTE  1 Day Post-Op  S/P Procedure(s) (LRB): PACEMAKER IMPLANT (N/A)   Stable day but still waiting for bed on 4E for transfer Starting to move more, but reportedly still "wobbly" with ambulation Some very mild confusion, possibly related to oxycodone  Plan: Continue current plan.  Will d/c oxycodone and use tramadol for pain.  Mobilize as much as possible  Rexene Alberts, MD 10/18/2018 5:40 PM

## 2018-10-18 NOTE — Plan of Care (Signed)

## 2018-10-18 NOTE — Progress Notes (Addendum)
      [address]       [place] [address]             [phone]      1 Day Post-Op Procedure(s) (LRB): PACEMAKER IMPLANT (N/A)   Subjective:  Patient up in chair.  No specific complaints.  Denies pain at pacemaker site  Objective: Vital signs in last 24 hours: Temp:  [96.6 F (35.9 C)-98.4 F (36.9 C)] 98.4 F (36.9 C) (09/09 0733) Pulse Rate:  [53-112] 81 (09/09 0700) Cardiac Rhythm: Ventricular paced;Atrial flutter (09/09 0700) Resp:  [10-49] 19 (09/09 0700) BP: (85-139)/(49-90) 135/90 (09/09 0700) SpO2:  [91 %-100 %] 96 % (09/09 0700) Weight:  [110.5 kg] 110.5 kg (09/09 0605)  Intake/Output from previous day: 09/08 0701 - 09/09 0700 In: 483.2 [P.O.:240; I.V.:143.2; IV Piggyback:100] Out: 1560 [Urine:1560]  General appearance: alert, cooperative and no distress Heart: regularly irregular rhythm Lungs: diminished breath sounds bibasilar Abdomen: soft, non-tender; bowel sounds normal; no masses,  no organomegaly Extremities: edema trace Wound: clean and dry  Lab Results: Recent Labs    10/17/18 0412 10/18/18 0217  WBC 16.6* 16.8*  HGB 8.6* 9.2*  HCT 26.2* 28.1*  PLT 142* 122*   BMET:  Recent Labs    10/17/18 0412 10/18/18 0217  NA 130* 130*  K 4.2 4.6  CL 95* 94*  CO2 27 24  GLUCOSE 98 112*  BUN 24* 23  CREATININE 0.83 0.86  CALCIUM 7.9* 7.7*    PT/INR:  Recent Labs    10/18/18 0217  LABPROT 19.9*  INR 1.7*   ABG    Component Value Date/Time   PHART 7.349 (L) 10/12/2018 0500   HCO3 19.4 (L) 10/12/2018 0500   TCO2 20 (L) 10/12/2018 0500   ACIDBASEDEF 6.0 (H) 10/12/2018 0500   O2SAT 97.0 10/12/2018 0500   CBG (last 3)  Recent Labs    10/17/18 1710 10/17/18 2247 10/18/18 0655  GLUCAP 98 120* 127*    Assessment/Plan: S/P Procedure(s) (LRB): PACEMAKER IMPLANT (N/A)  1. CV- S/p PPM yesterday, in Atrial Flutter this morning, BP controlled on Lisinopril, Norvasc, will d/c EPW today 2. INR 1.7, not much response  after several doses of coumadin, will increase dose to 7.5 mg daily 3. Pulm- no acute issues, off oxygen, CXR is free from pneumothorax, continue IS 4. Renal- creatinine WNL, weight continues to trend down, continue Lasix 40 mg daily, K WNL 5. Expected post operative blood loss anemia, mild Hgb is stable 6. Dispo- patient stable, will d/c EPW today, currently in A. Flutter S/P PPM placement yesterday, will increase coumadin dose as minimal response so far, continue diuretics, possibly ready for d/c in next 24-48 hours, waiting for bed on 4E   LOS: 7 days    Erin Barrett   I have seen and examined the patient and agree with the assessment and plan as outlined.  Rexene Alberts, MD 10/18/2018     10/18/2018

## 2018-10-19 LAB — CBC
HCT: 26.5 % — ABNORMAL LOW (ref 39.0–52.0)
Hemoglobin: 8.6 g/dL — ABNORMAL LOW (ref 13.0–17.0)
MCH: 28.7 pg (ref 26.0–34.0)
MCHC: 32.5 g/dL (ref 30.0–36.0)
MCV: 88.3 fL (ref 80.0–100.0)
Platelets: 123 10*3/uL — ABNORMAL LOW (ref 150–400)
RBC: 3 MIL/uL — ABNORMAL LOW (ref 4.22–5.81)
RDW: 16.8 % — ABNORMAL HIGH (ref 11.5–15.5)
WBC: 14.2 10*3/uL — ABNORMAL HIGH (ref 4.0–10.5)
nRBC: 1 % — ABNORMAL HIGH (ref 0.0–0.2)

## 2018-10-19 LAB — COMPREHENSIVE METABOLIC PANEL
ALT: 297 U/L — ABNORMAL HIGH (ref 0–44)
AST: 73 U/L — ABNORMAL HIGH (ref 15–41)
Albumin: 2.7 g/dL — ABNORMAL LOW (ref 3.5–5.0)
Alkaline Phosphatase: 209 U/L — ABNORMAL HIGH (ref 38–126)
Anion gap: 10 (ref 5–15)
BUN: 16 mg/dL (ref 8–23)
CO2: 26 mmol/L (ref 22–32)
Calcium: 8 mg/dL — ABNORMAL LOW (ref 8.9–10.3)
Chloride: 96 mmol/L — ABNORMAL LOW (ref 98–111)
Creatinine, Ser: 0.72 mg/dL (ref 0.61–1.24)
GFR calc Af Amer: >60 mL/min (ref >60)
GFR calc non Af Amer: >60 mL/min (ref >60)
Glucose, Bld: 101 mg/dL — ABNORMAL HIGH (ref 70–99)
Potassium: 4.3 mmol/L (ref 3.5–5.1)
Sodium: 132 mmol/L — ABNORMAL LOW (ref 135–145)
Total Bilirubin: 1.6 mg/dL — ABNORMAL HIGH (ref 0.3–1.2)
Total Protein: 5.4 g/dL — ABNORMAL LOW (ref 6.5–8.1)

## 2018-10-19 LAB — GLUCOSE, CAPILLARY
Glucose-Capillary: 123 mg/dL — ABNORMAL HIGH (ref 70–99)
Glucose-Capillary: 116 mg/dL — ABNORMAL HIGH (ref 70–99)

## 2018-10-19 LAB — PROTIME-INR
INR: 1.7 — ABNORMAL HIGH (ref 0.8–1.2)
Prothrombin Time: 19.7 seconds — ABNORMAL HIGH (ref 11.4–15.2)

## 2018-10-19 MED ORDER — METOPROLOL TARTRATE 25 MG PO TABS
25.0000 mg | ORAL_TABLET | Freq: Two times a day (BID) | ORAL | Status: DC
  Administered 2018-10-19 – 2018-10-20 (×3): 25 mg via ORAL
  Filled 2018-10-19 (×3): qty 1

## 2018-10-19 MED ORDER — ASPIRIN 81 MG PO CHEW
81.0000 mg | CHEWABLE_TABLET | Freq: Every day | ORAL | Status: DC
  Administered 2018-10-20: 09:00:00 81 mg via ORAL
  Filled 2018-10-19: qty 1

## 2018-10-19 MED ORDER — INFLUENZA VAC A&B SA ADJ QUAD 0.5 ML IM PRSY
0.5000 mL | PREFILLED_SYRINGE | INTRAMUSCULAR | Status: AC
  Administered 2018-10-20: 15:00:00 0.5 mL via INTRAMUSCULAR
  Filled 2018-10-19 (×2): qty 0.5

## 2018-10-19 NOTE — Progress Notes (Signed)
EVENING ROUNDS NOTE :     [address]       [place] [address]             [phone]                 2 Days Post-Op Procedure(s) (LRB): PACEMAKER IMPLANT (N/A)   Total Length of Stay:  LOS: 8 days  Events:  Doing well sitting in chair    BP 121/79 (BP Location: Left Arm)   Pulse (!) 101   Temp 98.2 F (36.8 C) (Oral)   Resp 15   Ht 5\' 9"  (1.753 m)   Wt 101.5 kg   SpO2 98%   BMI 33.04 kg/m         . sodium chloride      I/O last 3 completed shifts: In: 790 [P.O.:600; I.V.:90; IV Piggyback:100] Out: 1901 [Urine:1901]   CBC Latest Ref Rng & Units 10/19/2018 10/18/2018 10/17/2018  WBC 4.0 - 10.5 K/uL 14.2(H) 16.8(H) 16.6(H)  Hemoglobin 13.0 - 17.0 g/dL 8.6(L) 9.2(L) 8.6(L)  Hematocrit 39.0 - 52.0 % 26.5(L) 28.1(L) 26.2(L)  Platelets 150 - 400 K/uL 123(L) 122(L) 142(L)    BMP Latest Ref Rng & Units 10/19/2018 10/18/2018 10/17/2018  Glucose 70 - 99 mg/dL 101(H) 112(H) 98  BUN 8 - 23 mg/dL 16 23 24(H)  Creatinine 0.61 - 1.24 mg/dL 0.72 0.86 0.83  BUN/Creat Ratio 10 - 24 - - -  Sodium 135 - 145 mmol/L 132(L) 130(L) 130(L)  Potassium 3.5 - 5.1 mmol/L 4.3 4.6 4.2  Chloride 98 - 111 mmol/L 96(L) 94(L) 95(L)  CO2 22 - 32 mmol/L 26 24 27   Calcium 8.9 - 10.3 mg/dL 8.0(L) 7.7(L) 7.9(L)    ABG    Component Value Date/Time   PHART 7.349 (L) 10/12/2018 0500   PCO2ART 35.3 10/12/2018 0500   PO2ART 97.0 10/12/2018 0500   HCO3 19.4 (L) 10/12/2018 0500   TCO2 20 (L) 10/12/2018 0500   ACIDBASEDEF 6.0 (H) 10/12/2018 0500   O2SAT 97.0 10/12/2018 0500       Melodie Bouillon, MD 10/19/2018 6:23 PM

## 2018-10-19 NOTE — Evaluation (Signed)
Physical Therapy Evaluation Patient Details Name: [name] MRN: [id number] DOB: 1950/11/25 Today's Date: 10/19/2018   History of Present Illness   [name] is a 68 y.o. male with a history of aortic stenosis, mitral stenosis, CAD, HLD, previous thymectomy with radiation therapy to the chest, and tobacco abuse.  Pt underwent AVR,CABG x 2, MVR and closure of foramen ovale on 9/2, pacemaker 9/8.   Clinical Impression  Pt admitted with above diagnosis. Pt was able to ambulate with RW with min assist and mod cues as pt is unsteady at times.  Pt needs cues for sequencing steps and RW and fatigues quickly.  Decr safety as well. Needs cues for sternal precautions as well.  Will need to ensure sister and wife will be with pt prior to d/c home.  If not, may need short term SNF.   Pt currently with functional limitations due to the deficits listed below (see PT Problem List). Pt will benefit from skilled PT to increase their independence and safety with mobility to allow discharge to the venue listed below.      Follow Up Recommendations Home health PT;Supervision/Assistance - 24 hour(only if pt has 24 hour care by wife and sister)    Equipment Recommendations  None recommended by PT    Recommendations for Other Services       Precautions / Restrictions Precautions Precautions: Fall;Sternal Restrictions Weight Bearing Restrictions: (sternal)      Mobility  Bed Mobility               General bed mobility comments: in chair  Transfers Overall transfer level: Needs assistance Equipment used: Rolling walker (2 wheeled) Transfers: Sit to/from Stand Sit to Stand: Min guard;Min assist         General transfer comment: cues to scoot forward.  Cues for hands on knees and pt used momentum  Ambulation/Gait Ambulation/Gait assistance: Min Web designer (Feet): 95 Feet Assistive device: Rolling walker (2 wheeled) Gait Pattern/deviations: Decreased step length -  right;Decreased step length - left;Decreased stride length;Trunk flexed;Staggering left;Staggering right   Gait velocity interpretation: <1.31 ft/sec, indicative of household ambulator General Gait Details: Pt generally unsteady on his feet needing constant cues and assist for stability.  Pt diaphoretic however BP was stable.  Pt DOE 3/4 as he breathes heavy at times when fatigued. Pt also flexes more when he is fatiguing.  He also has trouble staying close to RW and sequencing steps and RW.  Needs constant cues for stability.   Stairs            Wheelchair Mobility    Modified Rankin (Stroke Patients Only)       Balance Overall balance assessment: Needs assistance Sitting-balance support: No upper extremity supported;Feet supported Sitting balance-Leahy Scale: Fair     Standing balance support: Bilateral upper extremity supported;During functional activity Standing balance-Leahy Scale: Poor Standing balance comment: relies on UE support and external support as well as RW for balance                             Pertinent Vitals/Pain Pain Assessment: No/denies pain    Home Living Family/patient expects to be discharged to:: Private residence Living Arrangements: Spouse/significant other Available Help at Discharge: Family;Available 24 hours/day(wife can help, sister can come help) Type of Home: House Home Access: Stairs to enter Entrance Stairs-Rails: None Entrance Stairs-Number of Steps: 3 Home Layout: Two level;Able to live on main level with bedroom/bathroom  Home Equipment: [place] - 2 wheels;Cane - single point;Bedside commode      Prior Function Level of Independence: Independent               Hand Dominance   Dominant Hand: Right    Extremity/Trunk Assessment   Upper Extremity Assessment Upper Extremity Assessment: Defer to OT evaluation    Lower Extremity Assessment Lower Extremity Assessment: Generalized weakness    Cervical /  Trunk Assessment Cervical / Trunk Assessment: Normal  Communication   Communication: No difficulties  Cognition Arousal/Alertness: Awake/alert Behavior During Therapy: WFL for tasks assessed/performed Overall Cognitive Status: Within Functional Limits for tasks assessed                                        General Comments      Exercises General Exercises - Lower Extremity Long Arc Quad: AROM;Both;10 reps;Seated Hip Flexion/Marching: AROM;Both;10 reps;Seated   Assessment/Plan    PT Assessment Patient needs continued PT services  PT Problem List Decreased activity tolerance;Decreased balance;Decreased mobility;Decreased knowledge of use of DME;Decreased safety awareness;Decreased knowledge of precautions;Cardiopulmonary status limiting activity       PT Treatment Interventions DME instruction;Gait training;Functional mobility training;Therapeutic activities;Therapeutic exercise;Balance training;Patient/family education    PT Goals (Current goals can be found in the Care Plan section)  Acute Rehab PT Goals Patient Stated Goal: to go home PT Goal Formulation: With patient Time For Goal Achievement: 11/02/18 Potential to Achieve Goals: Good    Frequency Min 3X/week   Barriers to discharge        Co-evaluation               AM-PAC PT "6 Clicks" Mobility  Outcome Measure Help needed turning from your back to your side while in a flat bed without using bedrails?: A Lot Help needed moving from lying on your back to sitting on the side of a flat bed without using bedrails?: A Lot Help needed moving to and from a bed to a chair (including a wheelchair)?: A Little Help needed standing up from a chair using your arms (e.g., wheelchair or bedside chair)?: A Little Help needed to walk in hospital room?: A Little Help needed climbing 3-5 steps with a railing? : A Lot 6 Click Score: 15    End of Session Equipment Utilized During Treatment: Gait  belt Activity Tolerance: Patient limited by fatigue Patient left: in chair;with call bell/phone within reach Nurse Communication: Mobility status PT Visit Diagnosis: Unsteadiness on feet (R26.81);Muscle weakness (generalized) (M62.81)    Time: [id number] PT Time Calculation (min) (ACUTE ONLY): 16 min   Charges:   PT Evaluation $PT Eval Moderate Complexity: [place] Pager:  [phone]  Office:  [phone]    Denice Paradise 10/19/2018, 1:23 PM

## 2018-10-19 NOTE — Plan of Care (Signed)

## 2018-10-19 NOTE — Progress Notes (Signed)
CARDIAC REHAB PHASE I   PRE:  Rate/Rhythm: 100 paced  BP:  Sitting: 108/70      SaO2: 97 RA  MODE:  Ambulation: 220 ft   POST:  Rate/Rhythm: 115 paced  BP:  Sitting: 132/75    SaO2: 98 RA   Pt ambulated 28ft in hallway assist of 2 with gait belt and front wheel walker. Pt with steady pace and one standing rest break. Pt c/o some SOB with ambulation but states this was his best walk yet. Pt does better when you set goals and give encouragement. Encouraged continued ambulation. Will continue to follow.  [id number] Rufina Falco, RN BSN 10/19/2018 3:04 PM

## 2018-10-19 NOTE — Progress Notes (Addendum)
T[facility] DAILY ICU PROGRESS NOTE                   [address]            [place] [address]          [phone]   2 Days Post-Op Procedure(s) (LRB): PACEMAKER IMPLANT (N/A)  Total Length of Stay:  LOS: 8 days   Subjective:  No new complaints.  Events of yesterday/last night noted. Patient is alert and oriented this morning.  Per nursing she has not yet walked patient today, however was wobbly requiring multiple breaks during walk last night  Objective: Vital signs in last 24 hours: Temp:  [97.8 F (36.6 C)-98.8 F (37.1 C)] 98 F (36.7 C) (09/10 0733) Pulse Rate:  [59-104] 104 (09/10 0404) Cardiac Rhythm: Ventricular paced (09/10 0404) Resp:  [13-20] 20 (09/10 0404) BP: (113-136)/(61-107) 126/83 (09/10 0404) SpO2:  [90 %-100 %] 95 % (09/10 0404) Weight:  [101.5 kg] 101.5 kg (09/10 0500)  Filed Weights   10/17/18 0500 10/18/18 0605 10/19/18 0500  Weight: 111.3 kg 110.5 kg 101.5 kg    Weight change: -9 kg   Intake/Output from previous day: 09/09 0701 - 09/10 0700 In: 600 [P.O.:600] Out: 1201 [Urine:1201]  Current Meds: Scheduled Meds: . amLODipine  5 mg Oral Daily  . aspirin  81 mg Oral QODAY  . atorvastatin  20 mg Oral q1800  . bisacodyl  10 mg Oral Daily   Or  . bisacodyl  10 mg Rectal Daily  . Chlorhexidine Gluconate Cloth  6 each Topical Daily  . docusate sodium  200 mg Oral Daily  . furosemide  40 mg Oral Daily  . lisinopril  10 mg Oral Daily  . pantoprazole  40 mg Oral Daily  . potassium chloride  20 mEq Oral Daily  . sodium chloride flush  3 mL Intravenous Q12H  . warfarin  7.5 mg Oral q1800  . Warfarin - Physician Dosing Inpatient   Does not apply q1800   Continuous Infusions: . sodium chloride     PRN Meds:.sodium chloride, hydrALAZINE, ondansetron (ZOFRAN) IV, sodium chloride flush, traMADol  General appearance: alert, cooperative and no distress Heart: regular rate and rhythm Lungs: clear to auscultation bilaterally Abdomen:  soft, non-tender; bowel sounds normal; no masses,  no organomegaly Extremities: edema trace Wound: clean and dry, mild dehiscence at [place]  Lab Results: CBC: Recent Labs    10/18/18 0217 10/19/18 0227  WBC 16.8* 14.2*  HGB 9.2* 8.6*  HCT 28.1* 26.5*  PLT 122* 123*   BMET:  Recent Labs    10/18/18 0217 10/19/18 0227  NA 130* 132*  K 4.6 4.3  CL 94* 96*  CO2 24 26  GLUCOSE 112* 101*  BUN 23 16  CREATININE 0.86 0.72  CALCIUM 7.7* 8.0*    CMET: Lab Results  Component Value Date   WBC 14.2 (H) 10/19/2018   HGB 8.6 (L) 10/19/2018   HCT 26.5 (L) 10/19/2018   PLT 123 (L) 10/19/2018   GLUCOSE 101 (H) 10/19/2018   CHOL 237 (H) 04/28/2018   TRIG 96 04/28/2018   HDL 63 04/28/2018   LDLCALC 155 (H) 04/28/2018   ALT 297 (H) 10/19/2018   AST 73 (H) 10/19/2018   NA 132 (L) 10/19/2018   K 4.3 10/19/2018   CL 96 (L) 10/19/2018   CREATININE 0.72 10/19/2018   BUN 16 10/19/2018   CO2 26 10/19/2018   PSA 5.64 07/14/2018   INR 1.7 (  H) 10/19/2018   HGBA1C 5.8 (H) 10/09/2018      PT/INR:  Recent Labs    10/19/18 0227  LABPROT 19.7*  INR 1.7*   Radiology: No results found.   Assessment/Plan: S/P Procedure(s) (LRB): PACEMAKER IMPLANT (N/A)  1. CV- Paced rhythm this morning A. Flutter, rate in the 110s, BP remains controlled- continue Lisinopril, Norvasc-- add Lopressor for HR control 2. INR 1.7, will repeat coumadin at 7.5 mg daily 3. Pulm- no acute issues, continue IS 4. Renal- creatinine WNL, weight is trending down continue Lasix 5. Neuro- mild confusion overnight, improved continue to hold Oxycodone, utilize Tramadol 6. Deconditioning- wobbling with ambulation, will get PT consult to arrange home health assistance 7. Dispo- patient stable, continue coumadin INR has been slow to respond, continue diuretics, watch EVH site no sign of infection,  Mild dehiscence on edge of incision, get PT consult, patient needs to be stable with mobility prior to discharge,  hopefully ready in the next 24-48 hours pending PT recommendations   Ellwood Handler PA-C [id number] 10/19/2018 7:41 AM    I have seen and examined the patient and agree with the assessment and plan as outlined.  Still waiting for bed on 4E for transfer, which continues to delay this patient's progression.  Ready for d/c home once he's more mobile.  Will ask for PT consult to assess needs post hospital discharge.  Rexene Alberts, MD 10/19/2018 1:24 PM

## 2018-10-19 NOTE — Progress Notes (Signed)
Completed rounds and introduced myself to [name].  I let him know that chaplain services were always available.  [name] wanted to rest, and also noted that he will be moving to a different floor soon.

## 2018-10-20 ENCOUNTER — Encounter (HOSPITAL_COMMUNITY): Payer: Self-pay | Admitting: Internal Medicine

## 2018-10-20 DIAGNOSIS — Z23 Encounter for immunization: Secondary | ICD-10-CM | POA: Diagnosis not present

## 2018-10-20 LAB — PROTIME-INR
INR: 1.8 — ABNORMAL HIGH (ref 0.8–1.2)
Prothrombin Time: 20.5 seconds — ABNORMAL HIGH (ref 11.4–15.2)

## 2018-10-20 MED ORDER — TRAMADOL HCL 50 MG PO TABS: 50.0000 mg | ORAL_TABLET | ORAL | 0 refills | Status: AC | PRN

## 2018-10-20 MED ORDER — METOPROLOL TARTRATE 25 MG PO TABS: 25.0000 mg | ORAL_TABLET | Freq: Two times a day (BID) | ORAL | 2 refills | Status: DC

## 2018-10-20 MED ORDER — LISINOPRIL 10 MG PO TABS: 10.0000 mg | ORAL_TABLET | Freq: Every day | ORAL | 2 refills | Status: DC

## 2018-10-20 MED ORDER — INFLUENZA VAC A&B SA ADJ QUAD 0.5 ML IM PRSY: 0.5000 mL | PREFILLED_SYRINGE | INTRAMUSCULAR | 0 refills | Status: AC

## 2018-10-20 MED ORDER — WARFARIN SODIUM 2.5 MG PO TABS: 5.0000 mg | ORAL_TABLET | Freq: Every day | ORAL | 11 refills | Status: DC

## 2018-10-20 MED ORDER — POTASSIUM CHLORIDE CRYS ER 20 MEQ PO TBCR: 20.0000 meq | EXTENDED_RELEASE_TABLET | Freq: Every day | ORAL | 2 refills | Status: DC

## 2018-10-20 MED ORDER — AMLODIPINE BESYLATE 5 MG PO TABS: 5.0000 mg | ORAL_TABLET | Freq: Every day | ORAL | 2 refills | Status: DC

## 2018-10-20 MED ORDER — ATORVASTATIN CALCIUM 20 MG PO TABS: 20.0000 mg | ORAL_TABLET | Freq: Every day | ORAL | 2 refills | Status: DC

## 2018-10-20 MED ORDER — FUROSEMIDE 40 MG PO TABS: 40.0000 mg | ORAL_TABLET | Freq: Every day | ORAL | 0 refills | Status: DC

## 2018-10-20 NOTE — Progress Notes (Signed)
Received report from [place] for pt coming to [id number]. Lajoyce Corners, RN

## 2018-10-20 NOTE — Plan of Care (Signed)
  Problem: Clinical Measurements: Goal: Ability to maintain clinical measurements within normal limits will improve Outcome: Progressing Goal: Respiratory complications will improve Outcome: Progressing   Problem: Activity: Goal: Risk for activity intolerance will decrease Outcome: Progressing   Problem: Pain Managment: Goal: General experience of comfort will improve Outcome: Completed/Met   Problem: Safety: Goal: Ability to remain free from injury will improve Outcome: Progressing   Pt has been resting in bed throughout the night. Pt's activity intolerance has been slightly improving. Pt verbalizes understanding of ambulation importance. Pt has denied pain throughout the night and has sustained no falls. Pt's heart rate has been in the 60s to 80s at times. Will continue to monitor.

## 2018-10-20 NOTE — Progress Notes (Signed)
CARDIAC REHAB PHASE I   D/c education completed with pt and wife. Pt educated on site care. Encouraged continued IS use, walks, and sternal precautions. Pt given in-the-tube sheet along with heart healthy diet and smoking cessation tip sheet. Reviewed restrictions and exercise guidelines. Will refer to CRP II [place].  [place], RN BSN 10/20/2018 3:22 PM

## 2018-10-20 NOTE — TOC Transition Note (Signed)
Transition of Care [facility]) - CM/SW Discharge Note Marvetta Gibbons RN, BSN Transitions of Care Unit 4E- RN Case Manager [phone]   Patient Details  Name: [name] MRN: [id number] Date of Birth: January 08, 1951  Transition of Care [facility]) CM/SW Contact:  Dawayne Patricia, RN Phone Number: 10/20/2018, 12:11 PM   Clinical Narrative:    Pt stable for transition home today s/p AVR/MVR/CABG- orders placed for HHPT and DME-RW. CM spoke with pt at bedside pt agreeable to [facility], list provided for choice Per CMS guidelines from medicare.gov website with star ratings (copy placed in shadow chart)- per pt he does not have a preference- asked this CM to call his wife- TC made to wife Marcie Bal- list reviewed over phone for choice- she also states no real proference just "go by star ratings is fine" reached out several [place] agencies- and [facility] has accepted referral for HHPT needs.  Confirmed pt's street address as: [place], [place], phone # are correct.  Call made to [facility] with [employer] for DME need- RW to be delivered to room prior to discharge.   Final next level of care: [place] Barriers to Discharge: No Barriers Identified   Patient Goals and CMS Choice Patient states their goals for this hospitalization and ongoing recovery are:: "to be able to walk and be stronger" CMS Medicare.gov Compare Post Acute Care list provided to:: Patient Choice offered to / list presented to : Spouse  Discharge Placement  Home with [facility]                     Discharge Plan and Services   Discharge Planning Services: CM Consult Post Acute Care Choice: Durable Medical Equipment, Home Health          DME Arranged: Walker rolling DME Agency: [employer] Date DME Agency Contacted: 10/20/18 Time DME Agency Contacted: 60 Representative spoke with at DME Agency: [place]: PT Manuel Garcia: [place] Date [place]: 10/20/18 Time [place]:  1210 Representative spoke with at [place]: [place] ([place]) Interventions     Readmission Risk Interventions Readmission Risk Prevention Plan 10/20/2018  Post Dischage Appt Complete  Medication Screening Complete  Transportation Screening Complete  Some recent data might be hidden

## 2018-10-20 NOTE — Progress Notes (Addendum)
3 Days Post-Op Procedure(s) (LRB): PACEMAKER IMPLANT (N/A) Subjective: Mr Schaut just transferred to 4E ~5:30 this AM. No new problems.  Progressing with mobility but was requiring frequent rest breaks and and assistance with balance while ambulating with PT yesterday. He said he will have full-time assistance at home from his wife and sister following discharge.   Objective: Vital signs in last 24 hours: Temp:  [98.1 F (36.7 C)-98.9 F (37.2 C)] 98.5 F (36.9 C) (09/11 0605) Pulse Rate:  [68-101] 77 (09/11 0605) Cardiac Rhythm: Ventricular paced (09/11 0605) Resp:  [15-25] 20 (09/11 0605) BP: (95-121)/(51-79) 107/60 (09/11 0605) SpO2:  [93 %-98 %] 95 % (09/11 0605) Weight:  [107.9 kg] 107.9 kg (09/11 0605)     Intake/Output from previous day: 09/10 0701 - 09/11 0700 In: 363 [P.O.:360; I.V.:3] Out: 900 [Urine:900] Intake/Output this shift: No intake/output data recorded.  General appearance: alert, cooperative and no distress Neurologic: intact Heart: monitor shows a-flutter with occasional PVC's and occasional paced beats.  Lungs: Breath sounds are clear.  Extremities: trace peripheral edema, right thigh incision intact and healing Wound: the sternotomy incision is healing with no sign of complication.  Lab Results: Recent Labs    10/18/18 0217 10/19/18 0227  WBC 16.8* 14.2*  HGB 9.2* 8.6*  HCT 28.1* 26.5*  PLT 122* 123*   BMET:  Recent Labs    10/18/18 0217 10/19/18 0227  NA 130* 132*  K 4.6 4.3  CL 94* 96*  CO2 24 26  GLUCOSE 112* 101*  BUN 23 16  CREATININE 0.86 0.72  CALCIUM 7.7* 8.0*    PT/INR:  Recent Labs    10/20/18 0318  LABPROT 20.5*  INR 1.8*   ABG    Component Value Date/Time   PHART 7.349 (L) 10/12/2018 0500   HCO3 19.4 (L) 10/12/2018 0500   TCO2 20 (L) 10/12/2018 0500   ACIDBASEDEF 6.0 (H) 10/12/2018 0500   O2SAT 97.0 10/12/2018 0500   CBG (last 3)  Recent Labs    10/18/18 1113 10/19/18 1205 10/19/18 1538  GLUCAP 119* 123*  116*    Assessment/Plan: S/P Procedure(s) (LRB): PACEMAKER IMPLANT (N/A)  -POD-9 AVR and MVR with tissue valves, CABG x2, closure of PFO, and clipping of LAA.  Progressing with mobility. He believes he is ready for discharge to home and says he has adequate 24hr assistance available to him.   -POD-3 dual chamber PPM for post-op complete heart block. Remains in a-flutter with controlled V-rate.  Continue metoprolol 25mg  po BID.  INR is slowly trending up. Continue Coumadin 5mg  po daily and f/u [facility] next week/  -Expected acute blood loss anemia- Hct relatively stable. No need for transfusion.   -Expected post-op leukocytosis- WBC trending down, no evidence of wound complication, CXR on 9/9 showed no infiltrates.  -Dyslipidemia- Continue atorvastatin.  -Volume excess- Wt still ~4lbs above pre-op wt.  Continue Lasix daily for 1 week post discharge.   -Disposition- Anticipate discharge later today.    LOS: 9 days    Antony Odea, PA-C [phone] 10/20/2018  I have seen and examined the patient and agree with the assessment and plan as outlined.  Ready for d/c home if satisfactory arrangements are in place.  Rexene Alberts, MD 10/20/2018 9:40 AM

## 2018-10-20 NOTE — Progress Notes (Signed)
CARDIAC REHAB PHASE I   Offered to walk with pt, pt states he recently went with PT. Pt helped into bed, more agile today than yesterday. Pt for d/c later today, states his wife will be here later. Pt requesting walker, RN and CM made aware. Will f/u later to complete d/c education.  [id number] Rufina Falco, RN BSN 10/20/2018 10:29 AM

## 2018-10-20 NOTE — Progress Notes (Signed)
Physical Therapy Treatment Patient Details Name: [name] MRN: [id number] DOB: 08-Feb-1951 Today's Date: 10/20/2018    History of Present Illness  [name] is a 68 y.o. male with a history of aortic stenosis, mitral stenosis, CAD, HLD, previous thymectomy with radiation therapy to the chest, and tobacco abuse.  Pt underwent AVR,CABG x 2, MVR and closure of foramen ovale on 9/2, pacemaker 9/8.     PT Comments    Pt progressing well towards his physical therapy goals. Ambulating 200 feet with walker at a min guard assist level. HR 70-98, 95% SpO2 on RA, BP 141/67. Pt continues with dynamic unsteadiness, needs reinforcement for sternal precautions. Education reviewed with pt and pt wife via phone. Pt will have 24/7 assist between wife and sister.     Follow Up Recommendations  Home health PT;Supervision/Assistance - 24 hour     Equipment Recommendations  None recommended by PT    Recommendations for Other Services       Precautions / Restrictions Precautions Precautions: Fall;Sternal Restrictions Weight Bearing Restrictions: Yes(sternal)    Mobility  Bed Mobility Overal bed mobility: Needs Assistance Bed Mobility: Rolling;Sidelying to Sit Rolling: Supervision Sidelying to sit: Supervision       General bed mobility comments: cues for log roll technique  Transfers Overall transfer level: Needs assistance Equipment used: Rolling walker (2 wheeled) Transfers: Sit to/from Stand Sit to Stand: Min guard;Min assist         General transfer comment: cues for hand on knees and use of momentum  Ambulation/Gait Ambulation/Gait assistance: Min guard Gait Distance (Feet): 200 Feet Assistive device: Rolling walker (2 wheeled) Gait Pattern/deviations: Decreased stride length;Trunk flexed     General Gait Details: Cues for upright posture, improved proximity to walker, dynamic instability requiring close min guard assist   Stairs             Wheelchair  Mobility    Modified Rankin (Stroke Patients Only)       Balance Overall balance assessment: Needs assistance Sitting-balance support: No upper extremity supported;Feet supported Sitting balance-Leahy Scale: Fair     Standing balance support: Bilateral upper extremity supported;During functional activity Standing balance-Leahy Scale: Poor Standing balance comment: relies on UE support and external support as well as RW for balance                            Cognition Arousal/Alertness: Awake/alert Behavior During Therapy: WFL for tasks assessed/performed Overall Cognitive Status: Impaired/Different from baseline Area of Impairment: Memory                     Memory: Decreased short-term memory         General Comments: decreased short term memory recall noted      Exercises      General Comments        Pertinent Vitals/Pain Pain Assessment: Faces Faces Pain Scale: Hurts a little bit Pain Location: incisional Pain Descriptors / Indicators: Operative site guarding Pain Intervention(s): Monitored during session    Home Living                      Prior Function            PT Goals (current goals can now be found in the care plan section) Acute Rehab PT Goals Patient Stated Goal: to go home PT Goal Formulation: With patient Time For Goal Achievement: 11/02/18 Potential to Achieve Goals:  Good Progress towards PT goals: Progressing toward goals    Frequency    Min 3X/week      PT Plan Current plan remains appropriate    Co-evaluation              AM-PAC PT "6 Clicks" Mobility   Outcome Measure  Help needed turning from your back to your side while in a flat bed without using bedrails?: None Help needed moving from lying on your back to sitting on the side of a flat bed without using bedrails?: None Help needed moving to and from a bed to a chair (including a wheelchair)?: A Little Help needed standing up from a  chair using your arms (e.g., wheelchair or bedside chair)?: A Little Help needed to walk in hospital room?: A Little Help needed climbing 3-5 steps with a railing? : A Lot 6 Click Score: 19    End of Session Equipment Utilized During Treatment: Gait belt Activity Tolerance: Patient tolerated treatment well Patient left: in chair;with call bell/phone within reach Nurse Communication: Mobility status PT Visit Diagnosis: Unsteadiness on feet (R26.81);Muscle weakness (generalized) (M62.81)     Time: [id number] PT Time Calculation (min) (ACUTE ONLY): 22 min  Charges:  $Gait Training: 8-22 mins                     Ellamae Sia, [place], DPT Acute Rehabilitation Services Pager [phone] Office [phone]    Willy Eddy 10/20/2018, 9:32 AM

## 2018-10-21 DIAGNOSIS — R911 Solitary pulmonary nodule: Secondary | ICD-10-CM | POA: Diagnosis not present

## 2018-10-21 DIAGNOSIS — I2510 Atherosclerotic heart disease of native coronary artery without angina pectoris: Secondary | ICD-10-CM | POA: Diagnosis not present

## 2018-10-21 DIAGNOSIS — T8131XD Disruption of external operation (surgical) wound, not elsewhere classified, subsequent encounter: Secondary | ICD-10-CM | POA: Diagnosis not present

## 2018-10-21 DIAGNOSIS — D62 Acute posthemorrhagic anemia: Secondary | ICD-10-CM | POA: Diagnosis not present

## 2018-10-21 DIAGNOSIS — I4892 Unspecified atrial flutter: Secondary | ICD-10-CM | POA: Diagnosis not present

## 2018-10-21 DIAGNOSIS — I9789 Other postprocedural complications and disorders of the circulatory system, not elsewhere classified: Secondary | ICD-10-CM | POA: Diagnosis not present

## 2018-10-21 DIAGNOSIS — I442 Atrioventricular block, complete: Secondary | ICD-10-CM | POA: Diagnosis not present

## 2018-10-21 DIAGNOSIS — F17210 Nicotine dependence, cigarettes, uncomplicated: Secondary | ICD-10-CM | POA: Diagnosis not present

## 2018-10-21 DIAGNOSIS — E782 Mixed hyperlipidemia: Secondary | ICD-10-CM | POA: Diagnosis not present

## 2018-10-23 ENCOUNTER — Ambulatory Visit (INDEPENDENT_AMBULATORY_CARE_PROVIDER_SITE_OTHER): Payer: Medicare HMO | Admitting: *Deleted

## 2018-10-23 ENCOUNTER — Telehealth: Payer: Self-pay | Admitting: Family Medicine

## 2018-10-23 ENCOUNTER — Other Ambulatory Visit: Payer: Self-pay

## 2018-10-23 DIAGNOSIS — Z953 Presence of xenogenic heart valve: Secondary | ICD-10-CM | POA: Diagnosis not present

## 2018-10-23 DIAGNOSIS — Z8774 Personal history of (corrected) congenital malformations of heart and circulatory system: Secondary | ICD-10-CM | POA: Diagnosis not present

## 2018-10-23 DIAGNOSIS — Z5181 Encounter for therapeutic drug level monitoring: Secondary | ICD-10-CM | POA: Diagnosis not present

## 2018-10-23 DIAGNOSIS — Z951 Presence of aortocoronary bypass graft: Secondary | ICD-10-CM

## 2018-10-23 LAB — POCT INR: INR: 1.9 — AB (ref 2.0–3.0)

## 2018-10-23 NOTE — Telephone Encounter (Signed)
Lvm for lora. [place]

## 2018-10-23 NOTE — Telephone Encounter (Signed)
ok 

## 2018-10-23 NOTE — Patient Instructions (Addendum)
Description   Today take 2.5 tablets then continue taking 2 tablets daily.  Recheck INR in 1 week. Coumadin Clinic [phone] Main [phone]     A full discussion of the nature of anticoagulants has been carried out.  A benefit risk analysis has been presented to the patient, so that they understand the justification for choosing anticoagulation at this time. The need for frequent and regular monitoring, precise dosage adjustment and compliance is stressed.  Side effects of potential bleeding are discussed.  The patient should avoid any OTC items containing aspirin or ibuprofen, and should avoid great swings in general diet.  Avoid alcohol consumption.  Call if any signs of abnormal bleeding.

## 2018-10-23 NOTE — Telephone Encounter (Signed)
Lora with [facility] called to request   [phone]  Verbal orders for    PT frequency 1 week One   2 week two   And 1 week 6

## 2018-10-24 DIAGNOSIS — E782 Mixed hyperlipidemia: Secondary | ICD-10-CM | POA: Diagnosis not present

## 2018-10-24 DIAGNOSIS — I9789 Other postprocedural complications and disorders of the circulatory system, not elsewhere classified: Secondary | ICD-10-CM | POA: Diagnosis not present

## 2018-10-24 DIAGNOSIS — R911 Solitary pulmonary nodule: Secondary | ICD-10-CM | POA: Diagnosis not present

## 2018-10-24 DIAGNOSIS — D62 Acute posthemorrhagic anemia: Secondary | ICD-10-CM | POA: Diagnosis not present

## 2018-10-24 DIAGNOSIS — I442 Atrioventricular block, complete: Secondary | ICD-10-CM | POA: Diagnosis not present

## 2018-10-24 DIAGNOSIS — T8131XD Disruption of external operation (surgical) wound, not elsewhere classified, subsequent encounter: Secondary | ICD-10-CM | POA: Diagnosis not present

## 2018-10-24 DIAGNOSIS — F17210 Nicotine dependence, cigarettes, uncomplicated: Secondary | ICD-10-CM | POA: Diagnosis not present

## 2018-10-24 DIAGNOSIS — I4892 Unspecified atrial flutter: Secondary | ICD-10-CM | POA: Diagnosis not present

## 2018-10-24 DIAGNOSIS — I2510 Atherosclerotic heart disease of native coronary artery without angina pectoris: Secondary | ICD-10-CM | POA: Diagnosis not present

## 2018-10-24 NOTE — Telephone Encounter (Signed)
[name] was advised [facility]

## 2018-10-26 DIAGNOSIS — I442 Atrioventricular block, complete: Secondary | ICD-10-CM | POA: Diagnosis not present

## 2018-10-26 DIAGNOSIS — D62 Acute posthemorrhagic anemia: Secondary | ICD-10-CM | POA: Diagnosis not present

## 2018-10-26 DIAGNOSIS — R911 Solitary pulmonary nodule: Secondary | ICD-10-CM | POA: Diagnosis not present

## 2018-10-26 DIAGNOSIS — T8131XD Disruption of external operation (surgical) wound, not elsewhere classified, subsequent encounter: Secondary | ICD-10-CM | POA: Diagnosis not present

## 2018-10-26 DIAGNOSIS — I9789 Other postprocedural complications and disorders of the circulatory system, not elsewhere classified: Secondary | ICD-10-CM | POA: Diagnosis not present

## 2018-10-26 DIAGNOSIS — E782 Mixed hyperlipidemia: Secondary | ICD-10-CM | POA: Diagnosis not present

## 2018-10-26 DIAGNOSIS — F17210 Nicotine dependence, cigarettes, uncomplicated: Secondary | ICD-10-CM | POA: Diagnosis not present

## 2018-10-26 DIAGNOSIS — I4892 Unspecified atrial flutter: Secondary | ICD-10-CM | POA: Diagnosis not present

## 2018-10-26 DIAGNOSIS — I2510 Atherosclerotic heart disease of native coronary artery without angina pectoris: Secondary | ICD-10-CM | POA: Diagnosis not present

## 2018-10-30 ENCOUNTER — Encounter: Payer: Self-pay | Admitting: Physician Assistant

## 2018-10-30 NOTE — Progress Notes (Addendum)
Cardiology Office Note    Date:  11/02/2018   ID:  Lauris, Serviss Dec 13, 1950, MRN [id number]  PCP:  Denita Lung, MD  Cardiologist:  Lauree Chandler, MD  Electrophysiologist:  None   Chief Complaint: post-op f/u MVR, AVR, CABG  History of Present Illness:   [name] is a 68 y.o. male with history of aortic stenosis, mitral stenosis, CAD s/p recent CABGx2, pericardial AVR + MVR + PFO closure c/b post-op bradycardia requiring PPM and atrial flutter, aortic ectasia, pulmonary nodules otherwise tobacco abuse, hyperlipidemia andesophageal stricture. He presents for post-operative evaluation. He has been followed for his valvular disease over the years, and was also known to have a PFO. He was found to have severe AS + MS by echo 06/2018. [facility] 09/2018 showed 100% prox RCA (CTO with L-R collaterals), 30% prox-mid Cx, 30% OM1, 30% OM3, 40% prox LAD, 95% mLAD. Pre-surgery duplex should 1-39% stenosis of the carotids. On 10/11/18 he underwent CABG x 2 with LIMA to LAD, SVG to RCA, bioprosthetic aortic valve replacement, bioprosthetic mitral valve replacement, Closure of Patent Foramen Ovale, and Clipping of LA appendage. Post-op course was notable for persistent bradycardia requiring Medtronic dual-chamber pacemaker implantation as well as post-op atrial fluter. Coumadin was started. Dr. Rayann Heman recommended to consider DCCV if he remained out of rhythm in f/u. His last labs 10/19/18 showed K 4.3, Cr 0.72, albumin 2.7, AST 73/ALT 297/alk phos 209, Hgb 8.6, Plt 123, 04/2018 LDL 155. He had wound check/EP review 9/22 which stated, "Wound check appointment. Steri-strips removed. Wound without redness or edema. Incision edges approximated, wound well healed. Normal device function. Thresholds, sensing, and impedances consistent with implant measurements. Device programmed at 3.5V/auto capture programmed on for extra safety margin until 3 month visit. Histogram distribution appropriate for patient and  level of activity. Pt mode switched 70.3%. Presenting in [place].  INR sub-therapeutic today at 1.6. 1 episode NSVT noted. Patient educated about wound care, arm mobility, lifting restrictions. ROV on 01/18/2019 with Dr. Rayann Heman. With sub-therapeutic INR, will not pursue rhythm control at this time.  INR managed by coumadin clinic. Will make Afib clinic appt for 3 weeks. With therapeutic INR, can discuss rhythm control at that time. Rates controlled overall. Pressure has been labile on Lopressor, so will not increase today." His blood pressure has been soft post-surgery so he is off amlodipine, lisinopril, and finished Lasix/KCl course.  He returns for follow-up overall feeling OK, just fatigued. He thought he would bounce back a little easier than he has. No chest pain, SOB, orthopnea, PND, awareness of HR, dizziness or syncope. He remains in what Dr. Lovena Le thinks is likely atrial flutter - HR 112. BP remains soft but tolerating well. Denies any bleeding.   Past Medical History:  Diagnosis Date   BCE (basal cell epithelioma), arm    RIGHT SHOULDER   Bradycardia    CAD (coronary artery disease)    a. s/p CABGx2 10/11/18 LIMA to LAD, SVG to RCA, EVH via right thigh.   Dyslipidemia    Ectatic thoracic aorta (HCC)    Esophageal stricture    Hyperlipidemia    Incidental pulmonary nodule, > 67m and < 864m8/26/2020   Noted on CTA   Inguinal hernia    bilateral   Obesity    S/P aortic valve replacement with bioprosthetic valve 10/11/2018   23 mm Edwards Inspiris Resilia stented bovine pericardial tissue valve   S/P CABG x 2 10/11/2018   LIMA to LAD, SVG to  RCA, EVH via right thigh   S/P mitral valve replacement with bioprosthetic valve 10/11/2018   27 mm Medtronic Mosaic stented porcine bioprosthetic tissue valve   S/P patent foramen ovale closure 10/11/2018   S/P placement of cardiac pacemaker    Smoker     Past Surgical History:  Procedure Laterality Date   AORTIC VALVE  REPLACEMENT N/A 10/11/2018   Procedure: AORTIC VALVE REPLACEMENT (AVR) with 23 Inspiris Bioprosthetic Aortic valve.;  Surgeon: Rexene Alberts, MD;  Location: [place];  Service: Open Heart Surgery;  Laterality: N/A;   CLIPPING OF ATRIAL APPENDAGE N/A 10/11/2018   Procedure: CLIPPING OF LEFT ATRIAL APPENDAGE with 45 AtriCure Clip.;  Surgeon: Rexene Alberts, MD;  Location: [place];  Service: Open Heart Surgery;  Laterality: N/A;   COLONOSCOPY  03/2008,2004   Dr. Amedeo Plenty   CORONARY ARTERY BYPASS GRAFT N/A 10/11/2018   Procedure: CORONARY ARTERY BYPASS GRAFTING (CABG) x 2 using LIMA to the LAD and endoscopic greater saphenous vein harvesting to the RCA.;  Surgeon: Rexene Alberts, MD;  Location: [place];  Service: Open Heart Surgery;  Laterality: N/A;   HERNIA REPAIR  12/11/10   BIH   MITRAL VALVE REPLACEMENT N/A 10/11/2018   Procedure: MITRAL VALVE (MV) REPLACEMENT with 27 Mosaic Bioprosthetic Mitral valve.;  Surgeon: Rexene Alberts, MD;  Location: [place];  Service: Open Heart Surgery;  Laterality: N/A;   PACEMAKER IMPLANT N/A 10/17/2018   Procedure: PACEMAKER IMPLANT;  Surgeon: Thompson Grayer, MD;  Location: [facility] CV LAB;  Service: Cardiovascular;  Laterality: N/A;   REPAIR OF PATENT FORAMEN OVALE N/A 10/11/2018   Procedure: CLOSURE OF PATENT FORAMEN OVALE;  Surgeon: Rexene Alberts, MD;  Location: [place];  Service: Open Heart Surgery;  Laterality: N/A;   RIGHT/LEFT HEART CATH AND CORONARY ANGIOGRAPHY N/A 09/15/2018   Procedure: RIGHT/LEFT HEART CATH AND CORONARY ANGIOGRAPHY;  Surgeon: Burnell Blanks, MD;  Location: [facility] CV LAB;  Service: Cardiovascular;  Laterality: N/A;   TEE WITHOUT CARDIOVERSION N/A 12/12/2015   Procedure: TRANSESOPHAGEAL ECHOCARDIOGRAM (TEE);  Surgeon: Sanda Klein, MD;  Location: [facility] ENDOSCOPY;  Service: Cardiovascular;  Laterality: N/A;   TEE WITHOUT CARDIOVERSION N/A 10/11/2018   Procedure: TRANSESOPHAGEAL ECHOCARDIOGRAM (TEE);  Surgeon: Rexene Alberts, MD;   Location: [place];  Service: Open Heart Surgery;  Laterality: N/A;   THYMECTOMY  1976    Current Medications: Current Meds  Medication Sig   aspirin 81 MG tablet Take 81 mg by mouth every other day.    atorvastatin (LIPITOR) 20 MG tablet Take 1 tablet (20 mg total) by mouth daily at 6 PM.   metoprolol tartrate (LOPRESSOR) 25 MG tablet Take 1 tablet (25 mg total) by mouth 2 (two) times daily.   oxymetazoline (AFRIN) 0.05 % nasal spray Place 1 spray into both nostrils at bedtime as needed for congestion.   warfarin (COUMADIN) 2.5 MG tablet Take 2 tablets (5 mg total) by mouth daily. or as directed by the Coumadin Clinic      Allergies:   Tylenol [acetaminophen]   Social History   Socioeconomic History   Marital status: Married    Spouse name: Not on file   Number of children: 0   Years of education: Not on file   Highest education level: Not on file  Occupational History   Occupation: Programme researcher, broadcasting/film/video: [employer]  Social Needs   Financial resource strain: Not on file   Food insecurity    Worry: Not on file  Inability: Not on file   Transportation needs    Medical: Not on file    Non-medical: Not on file  Tobacco Use   Smoking status: Former Smoker    Packs/day: 0.25    Years: 35.00    Pack years: 8.75    Types: Cigarettes    Quit date: 09/04/2018    Years since quitting: 0.1   Smokeless tobacco: Never Used  Substance and Sexual Activity   Alcohol use: Yes    Alcohol/week: 1.0 standard drinks    Types: 1 Cans of beer per week   Drug use: No   Sexual activity: Yes  Lifestyle   Physical activity    Days per week: Not on file    Minutes per session: Not on file   Stress: Not on file  Relationships   Social connections    Talks on phone: Not on file    Gets together: Not on file    Attends religious service: Not on file    Active member of club or organization: Not on file    Attends meetings of clubs or organizations: Not on file     Relationship status: Not on file  Other Topics Concern   Not on file  Social History Narrative   Not on file     Family History:  The patient's family history includes Cancer (age of onset: 13) in his father; Cancer (age of onset: 62) in his mother. There is no history of CAD.  ROS:   Please see the history of present illness.  All other systems are reviewed and otherwise negative.    EKGs/Labs/Other Studies Reviewed:    Studies reviewed were summarized above.   EKG:  EKG is ordered today, personally reviewed, demonstrating probable atrial flutter 112bpm - atrial sensed, v paced - d/w Dr. Lovena Le. He cannot exclude an atrial tach but feels likely atrial flutter. Otherwise nonspecific STT changes.  Recent Labs: 10/13/2018: Magnesium 2.5 10/19/2018: ALT 297; BUN 16; Creatinine, Ser 0.72; Hemoglobin 8.6; Platelets 123; Potassium 4.3; Sodium 132  Recent Lipid Panel    Component Value Date/Time   CHOL 237 (H) 04/28/2018 1020   TRIG 96 04/28/2018 1020   HDL 63 04/28/2018 1020   CHOLHDL 3.8 04/28/2018 1020   CHOLHDL 4.2 03/25/2015 0001   VLDL 30 03/25/2015 0001   LDLCALC 155 (H) 04/28/2018 1020    PHYSICAL EXAM:    VS:  BP 100/70    Pulse (!) 112    Ht '5\' 9"'$  (1.753 m)    Wt 217 lb (98.4 kg)    SpO2 98%    BMI 32.05 kg/m   BMI: Body mass index is 32.05 kg/m.  GEN: Well nourished, well developed WM, in no acute distress HEENT: normocephalic, atraumatic Neck: no JVD, carotid bruits, or masses Cardiac: reg rhythm, tachycardic; soft expected SEM with crisp valve in RUSB. No edema  Respiratory:  clear to auscultation bilaterally, normal work of breathing GI: soft, nontender, nondistended, + BS MS: no deformity or atrophy Skin: warm and dry, no rash, incisions c/d/i Neuro:  Alert and Oriented x 3, Strength and sensation are intact, follows commands Psych: euthymic mood, full affect  Wt Readings from Last 3 Encounters:  11/02/18 217 lb (98.4 kg)  10/20/18 237 lb 14 oz (107.9 kg)   10/09/18 233 lb 8 oz (105.9 kg)     ASSESSMENT & PLAN:   1. Recent AVR/MVR and CAD s/p CABG and PFO closure - aside from EP issues, he appears to be  doing well clinically. I reviewed with Dr. Lovena Le (DOD) whether he would need post-op echo sooner given his BP/HR and he did not think so. He will continue ASA, BB, statin. Will arrange 6 week gen cards f/u - at that time would consider arranging post-op echo (if HR controlled). SBE ppx reviewed. 2. Bradycardia s/p PPM - managed by EP, will see Dr. Rayann Heman in 01/2019. 3. Atrial flutter - remains an ongoing issue. I reviewed with Dr. Lovena Le. May need to consider TEE/DCCV sooner than waiting 3 full weeks for DCCV alone. INR was subtherapeutic this week. Since he is clinically stable otherwise, Dr. Lovena Le feels OP f/u is OK therefore per our discussion will keep plan for INR check next week on 9/29 but move up afib clinic appointment shortly after. Unable to titrate metoprolol due to BP. I will also update his labs today to make sure nothing else driving his tachycardia, including TSH.  4. Hyperlipidemia on 04/2018 labs but transaminitis recently - moderate dose of statin on board, had abnormal LFTs in the hospital. Will recheck LFTs today with labs. Arrange repeat CMET/lipids for 4-6 weeks. LDL goal <70 so would consider statin titration in the future for CAD if we are able. 5. Ectatic thoracic aorta - f/u recommended in 1 year by CT or MRA. However, he also had pulmonary nodules with recommendation for f/u CT 3-6 months. I asked him to discuss f/u of the pulmonary nodules with his PCP as this will likely then reset the clock for when his CT/MRA would be due. This can be reviewed at next f/u.  Disposition: F/u with afib clinic next week as advised above, and Dr. Juanda Crumble on a [place] day in 6 weeks.  Medication Adjustments/Labs and Tests Ordered: Current medicines are reviewed at length with the patient today.  Concerns regarding medicines are outlined  above. Medication changes, Labs and Tests ordered today are summarized above and listed in the Patient Instructions accessible in Encounters.   Signed, Charlie Pitter, PA-C  11/02/2018 11:38 AM    [facility] [address], [place], [place]  [address] Phone: [phone]; Fax: [phone]

## 2018-10-31 ENCOUNTER — Telehealth (HOSPITAL_COMMUNITY): Payer: Self-pay | Admitting: *Deleted

## 2018-10-31 ENCOUNTER — Other Ambulatory Visit: Payer: Self-pay

## 2018-10-31 ENCOUNTER — Ambulatory Visit (INDEPENDENT_AMBULATORY_CARE_PROVIDER_SITE_OTHER): Payer: Medicare HMO | Admitting: Student

## 2018-10-31 ENCOUNTER — Ambulatory Visit (INDEPENDENT_AMBULATORY_CARE_PROVIDER_SITE_OTHER): Payer: Medicare HMO | Admitting: *Deleted

## 2018-10-31 DIAGNOSIS — I442 Atrioventricular block, complete: Secondary | ICD-10-CM

## 2018-10-31 DIAGNOSIS — R911 Solitary pulmonary nodule: Secondary | ICD-10-CM | POA: Diagnosis not present

## 2018-10-31 DIAGNOSIS — E782 Mixed hyperlipidemia: Secondary | ICD-10-CM | POA: Diagnosis not present

## 2018-10-31 DIAGNOSIS — Z8774 Personal history of (corrected) congenital malformations of heart and circulatory system: Secondary | ICD-10-CM

## 2018-10-31 DIAGNOSIS — Z953 Presence of xenogenic heart valve: Secondary | ICD-10-CM

## 2018-10-31 DIAGNOSIS — I2510 Atherosclerotic heart disease of native coronary artery without angina pectoris: Secondary | ICD-10-CM | POA: Diagnosis not present

## 2018-10-31 DIAGNOSIS — I9789 Other postprocedural complications and disorders of the circulatory system, not elsewhere classified: Secondary | ICD-10-CM | POA: Diagnosis not present

## 2018-10-31 DIAGNOSIS — Z5181 Encounter for therapeutic drug level monitoring: Secondary | ICD-10-CM

## 2018-10-31 DIAGNOSIS — D62 Acute posthemorrhagic anemia: Secondary | ICD-10-CM | POA: Diagnosis not present

## 2018-10-31 DIAGNOSIS — F17210 Nicotine dependence, cigarettes, uncomplicated: Secondary | ICD-10-CM | POA: Diagnosis not present

## 2018-10-31 DIAGNOSIS — I4892 Unspecified atrial flutter: Secondary | ICD-10-CM | POA: Diagnosis not present

## 2018-10-31 DIAGNOSIS — Z951 Presence of aortocoronary bypass graft: Secondary | ICD-10-CM | POA: Diagnosis not present

## 2018-10-31 DIAGNOSIS — T8131XD Disruption of external operation (surgical) wound, not elsewhere classified, subsequent encounter: Secondary | ICD-10-CM | POA: Diagnosis not present

## 2018-10-31 LAB — CUP PACEART INCLINIC DEVICE CHECK
Battery Remaining Longevity: 160 mo
Battery Voltage: 3.21 V
Brady Statistic AP VP Percent: 4.68 %
Brady Statistic AP VS Percent: 0.04 %
Brady Statistic AS VP Percent: 89.87 %
Brady Statistic AS VS Percent: 5.18 %
Brady Statistic RA Percent Paced: 1.4 %
Brady Statistic RV Percent Paced: 48.1 %
Date Time Interrogation Session: 20200922115105
Implantable Lead Implant Date: 20200908
Implantable Lead Location: 753859
Implantable Lead Location: 753860
Implantable Lead Model: 3830
Implantable Lead Model: 5076
Implantable Pulse Generator Implant Date: 20200908
Lead Channel Impedance Value: 285 Ohm
Lead Channel Impedance Value: 399 Ohm
Lead Channel Impedance Value: 475 Ohm
Lead Channel Impedance Value: 608 Ohm
Lead Channel Pacing Threshold Amplitude: 0.625 V
Lead Channel Pacing Threshold Pulse Width: 0.4 ms
Lead Channel Sensing Intrinsic Amplitude: 14.375 mV
Lead Channel Sensing Intrinsic Amplitude: 14.625 mV
Lead Channel Sensing Intrinsic Amplitude: 2.25 mV
Lead Channel Sensing Intrinsic Amplitude: 3.125 mV
Lead Channel Setting Pacing Amplitude: 3.5 V
Lead Channel Setting Pacing Amplitude: 3.5 V
Lead Channel Setting Pacing Pulse Width: 0.4 ms
Lead Channel Setting Sensing Sensitivity: 1.2 mV

## 2018-10-31 LAB — POCT INR: INR: 1.6 — AB (ref 2.0–3.0)

## 2018-10-31 NOTE — Telephone Encounter (Signed)
Wife called in stating patient feeling weak BP 90/60 HR 114 - discussed with Dr. Rayann Heman - will hold amlodipine today and attempt to increase metoprolol in the AM. Encouraged extra POs this afternoon to increase BP - wife will call me back in the AM with update on HR/BP and will uptitrate as BP will allow.

## 2018-10-31 NOTE — Progress Notes (Signed)
Wound check appointment. Steri-strips removed. Wound without redness or edema. Incision edges approximated, wound well healed. Normal device function. Thresholds, sensing, and impedances consistent with implant measurements. Device programmed at 3.5V/auto capture programmed on for extra safety margin until 3 month visit. Histogram distribution appropriate for patient and level of activity. Pt mode switched 70.3%. Presenting in [place].  INR sub-therapeutic today at 1.6. 1 episode NSVT noted. Patient educated about wound care, arm mobility, lifting restrictions. ROV on 01/18/2019 with Dr. Rayann Heman  With sub-therapeutic INR, will not pursue rhythm control at this time.  INR managed by coumadin clinic. Will make Afib clinic appt for 3 weeks. With therapeutic INR, can discuss rhythm control at that time. Rates controlled overall. Pressure has been labile on Lopressor, so will not increase today.   Shirley Friar, PA-C  10/31/2018 11:47 AM

## 2018-10-31 NOTE — Patient Instructions (Signed)
Description    Take 2 tablets daily, except for 3 tablets on Tuesdays and Saturdays.  Recheck INR in 1 week. Coumadin Clinic [phone] Main [phone] Goal to be 2.5 per d/c hospital d/c summary.

## 2018-11-01 ENCOUNTER — Telehealth: Payer: Self-pay | Admitting: Cardiovascular Disease

## 2018-11-01 NOTE — Telephone Encounter (Signed)
    COVID-19 Pre-Screening Questions:  . In the past 7 to 10 days have you had a cough,  shortness of breath, headache, congestion, fever (100 or greater) body aches, chills, sore throat, or sudden loss of taste or sense of smell? no . Have you been around anyone with known Covid 19. no . Have you been around anyone who is awaiting Covid 19 test results in the past 7 to 10 days? no . Have you been around anyone who has been exposed to Covid 19, or has mentioned symptoms of Covid 19 within the past 7 to 10 days? no  If you have any concerns/questions about symptoms patients report during screening (either on the phone or at threshold). Contact the provider seeing the patient or DOD for further guidance.  If neither are available contact a member of the leadership team.  Pt reports wife answers no to these questions also.  He reports wife has been accompanying him to his follow up appointments because he continues to feel very tired after surgery.  I told him I would note that she could attend with him.  He is aware they both will need to wear masks to the appointment.

## 2018-11-01 NOTE — Telephone Encounter (Signed)
New Message  Patient is calling in to get clearance for his wife to accompany him to his appointment on 11/02/18 at 10:45 with Dayna Dunn PA-C. Patient states that his wife handles all of his medications and wants her to be there with him because he has not been feeling good since his surgery. Please give patient a call back.

## 2018-11-02 ENCOUNTER — Ambulatory Visit (INDEPENDENT_AMBULATORY_CARE_PROVIDER_SITE_OTHER): Payer: Self-pay | Admitting: *Deleted

## 2018-11-02 ENCOUNTER — Ambulatory Visit: Payer: Medicare HMO | Admitting: Physician Assistant

## 2018-11-02 ENCOUNTER — Encounter: Payer: Self-pay | Admitting: Physician Assistant

## 2018-11-02 ENCOUNTER — Other Ambulatory Visit: Payer: Self-pay

## 2018-11-02 VITALS — BP 100/70 | HR 112 | Ht 69.0 in | Wt 217.0 lb

## 2018-11-02 DIAGNOSIS — Z9889 Other specified postprocedural states: Secondary | ICD-10-CM

## 2018-11-02 DIAGNOSIS — F17210 Nicotine dependence, cigarettes, uncomplicated: Secondary | ICD-10-CM | POA: Diagnosis not present

## 2018-11-02 DIAGNOSIS — E785 Hyperlipidemia, unspecified: Secondary | ICD-10-CM | POA: Diagnosis not present

## 2018-11-02 DIAGNOSIS — R911 Solitary pulmonary nodule: Secondary | ICD-10-CM | POA: Diagnosis not present

## 2018-11-02 DIAGNOSIS — Z953 Presence of xenogenic heart valve: Secondary | ICD-10-CM

## 2018-11-02 DIAGNOSIS — D62 Acute posthemorrhagic anemia: Secondary | ICD-10-CM | POA: Diagnosis not present

## 2018-11-02 DIAGNOSIS — E782 Mixed hyperlipidemia: Secondary | ICD-10-CM | POA: Diagnosis not present

## 2018-11-02 DIAGNOSIS — I77810 Thoracic aortic ectasia: Secondary | ICD-10-CM | POA: Diagnosis not present

## 2018-11-02 DIAGNOSIS — Z950 Presence of cardiac pacemaker: Secondary | ICD-10-CM | POA: Diagnosis not present

## 2018-11-02 DIAGNOSIS — Z8774 Personal history of (corrected) congenital malformations of heart and circulatory system: Secondary | ICD-10-CM

## 2018-11-02 DIAGNOSIS — Z4802 Encounter for removal of sutures: Secondary | ICD-10-CM

## 2018-11-02 DIAGNOSIS — T8131XD Disruption of external operation (surgical) wound, not elsewhere classified, subsequent encounter: Secondary | ICD-10-CM | POA: Diagnosis not present

## 2018-11-02 DIAGNOSIS — I4892 Unspecified atrial flutter: Secondary | ICD-10-CM

## 2018-11-02 DIAGNOSIS — I9789 Other postprocedural complications and disorders of the circulatory system, not elsewhere classified: Secondary | ICD-10-CM | POA: Diagnosis not present

## 2018-11-02 DIAGNOSIS — I2510 Atherosclerotic heart disease of native coronary artery without angina pectoris: Secondary | ICD-10-CM | POA: Diagnosis not present

## 2018-11-02 DIAGNOSIS — I442 Atrioventricular block, complete: Secondary | ICD-10-CM | POA: Diagnosis not present

## 2018-11-02 LAB — COMPREHENSIVE METABOLIC PANEL
ALT: 26 IU/L (ref 0–44)
AST: 25 IU/L (ref 0–40)
Albumin/Globulin Ratio: 1.3 (ref 1.2–2.2)
Albumin: 3.9 g/dL (ref 3.8–4.8)
Alkaline Phosphatase: 117 IU/L (ref 39–117)
BUN/Creatinine Ratio: 17 (ref 10–24)
BUN: 15 mg/dL (ref 8–27)
Bilirubin Total: 0.7 mg/dL (ref 0.0–1.2)
CO2: 25 mmol/L (ref 20–29)
Calcium: 9.1 mg/dL (ref 8.6–10.2)
Chloride: 102 mmol/L (ref 96–106)
Creatinine, Ser: 0.88 mg/dL (ref 0.76–1.27)
GFR calc Af Amer: 102 mL/min/{1.73_m2} (ref >59)
GFR calc non Af Amer: 88 mL/min/{1.73_m2} (ref >59)
Globulin, Total: 2.9 g/dL (ref 1.5–4.5)
Glucose: 146 mg/dL — ABNORMAL HIGH (ref 65–99)
Potassium: 4.5 mmol/L (ref 3.5–5.2)
Sodium: 137 mmol/L (ref 134–144)
Total Protein: 6.8 g/dL (ref 6.0–8.5)

## 2018-11-02 LAB — CBC
Hematocrit: 34 % — ABNORMAL LOW (ref 37.5–51.0)
Hemoglobin: 11 g/dL — ABNORMAL LOW (ref 13.0–17.7)
MCH: 26.5 pg — ABNORMAL LOW (ref 26.6–33.0)
MCHC: 32.4 g/dL (ref 31.5–35.7)
MCV: 82 fL (ref 79–97)
Platelets: 383 10*3/uL (ref 150–450)
RBC: 4.15 x10E6/uL (ref 4.14–5.80)
RDW: 18.2 % — ABNORMAL HIGH (ref 11.6–15.4)
WBC: 8.3 10*3/uL (ref 3.4–10.8)

## 2018-11-02 LAB — TSH: TSH: 1.73 u[IU]/mL (ref 0.450–4.500)

## 2018-11-02 NOTE — Patient Instructions (Addendum)
.  Medication Instructions:  Your physician recommends that you continue on your current medications as directed. Please refer to the Current Medication list given to you today.  If you need a refill on your cardiac medications before your next appointment, please call your pharmacy.   Lab work: TODAY:  STAT CMET, CBC, & TSH 12/14/2018:  COME FASTING TO YOUR APPOINTMENT:  FASTING LIPID/CMET  If you have labs (blood work) drawn today and your tests are completely normal, you will receive your results only by: Marland Kitchen MyChart Message (if you have MyChart) OR . A paper copy in the mail If you have any lab test that is abnormal or we need to change your treatment, we will call you to review the results.  Testing/Procedures: None ordered  Follow-Up: At [facility], you and your health needs are our priority.  As part of our continuing mission to provide you with exceptional heart care, we have created designated Provider Care Teams.  These Care Teams include your primary Cardiologist (physician) and Advanced Practice Providers (APPs -  Physician Assistants and Nurse Practitioners) who all work together to provide you with the care you need, when you need it. . You will need a follow up appointment in office 12/14/2018 with Vin Bhagat, PA-C 9:00.  COME FASTING.  ARRIVE AT 8:45 FOR REGISTRATION  . We have moved your appointment up at the [facility] for 11/08/2018 at 1:30.  You will need to have your appointment moved up in the afib clinic to 11/08/2018 or Any Other Special Instructions Will Be Listed Below (If Applicable). Endocarditis Information  You may be at risk for developing endocarditis since you have  an artificial heart valve  or a repaired heart valve. Endocarditis is an infection of the lining of the heart or heart valves.   Certain surgical and dental procedures may put you at risk,  such as teeth cleaning or other dental procedures or any surgery involving the respiratory, urinary,  gastrointestinal tract, gallbladder or prostate.   Notify your doctor or dentist before having any invasive procedures. You will need to take antibiotics before certain procedures.   To prevent endocarditis, maintain good oral health. Seek prompt medical attention for any mouth/gum, skin or urinary tract infections.

## 2018-11-02 NOTE — Progress Notes (Signed)
[name] cardiology office called to say that he had remaining chest tube sutures that needed to be removed.  He is s/p AVR/MVR/CABG X2/CL.of PFO and CL.of A.A. on 10/11/18.  On exam his sternal incision and right leg EVH sites are very well healed.  There are 4 sutures in the previous chest tube sites.  The sutures were easily removed.  Some are slow to heal and I removed a loose scab from one of the sites to promote faster healing.  I instructed him in wound care.  He is doing well at home with no problems.  He will return as scheduled with a CXR.

## 2018-11-07 ENCOUNTER — Ambulatory Visit (INDEPENDENT_AMBULATORY_CARE_PROVIDER_SITE_OTHER): Payer: Medicare HMO | Admitting: *Deleted

## 2018-11-07 ENCOUNTER — Telehealth: Payer: Self-pay | Admitting: Physician Assistant

## 2018-11-07 ENCOUNTER — Other Ambulatory Visit: Payer: Self-pay

## 2018-11-07 DIAGNOSIS — I2510 Atherosclerotic heart disease of native coronary artery without angina pectoris: Secondary | ICD-10-CM | POA: Diagnosis not present

## 2018-11-07 DIAGNOSIS — Z953 Presence of xenogenic heart valve: Secondary | ICD-10-CM | POA: Diagnosis not present

## 2018-11-07 DIAGNOSIS — Z951 Presence of aortocoronary bypass graft: Secondary | ICD-10-CM | POA: Diagnosis not present

## 2018-11-07 DIAGNOSIS — I442 Atrioventricular block, complete: Secondary | ICD-10-CM | POA: Diagnosis not present

## 2018-11-07 DIAGNOSIS — F17210 Nicotine dependence, cigarettes, uncomplicated: Secondary | ICD-10-CM | POA: Diagnosis not present

## 2018-11-07 DIAGNOSIS — E782 Mixed hyperlipidemia: Secondary | ICD-10-CM | POA: Diagnosis not present

## 2018-11-07 DIAGNOSIS — D62 Acute posthemorrhagic anemia: Secondary | ICD-10-CM | POA: Diagnosis not present

## 2018-11-07 DIAGNOSIS — I9789 Other postprocedural complications and disorders of the circulatory system, not elsewhere classified: Secondary | ICD-10-CM | POA: Diagnosis not present

## 2018-11-07 DIAGNOSIS — R911 Solitary pulmonary nodule: Secondary | ICD-10-CM | POA: Diagnosis not present

## 2018-11-07 DIAGNOSIS — Z5181 Encounter for therapeutic drug level monitoring: Secondary | ICD-10-CM

## 2018-11-07 DIAGNOSIS — Z8774 Personal history of (corrected) congenital malformations of heart and circulatory system: Secondary | ICD-10-CM

## 2018-11-07 DIAGNOSIS — I4892 Unspecified atrial flutter: Secondary | ICD-10-CM | POA: Diagnosis not present

## 2018-11-07 DIAGNOSIS — T8131XD Disruption of external operation (surgical) wound, not elsewhere classified, subsequent encounter: Secondary | ICD-10-CM | POA: Diagnosis not present

## 2018-11-07 LAB — POCT INR: INR: 2 (ref 2.0–3.0)

## 2018-11-07 NOTE — Telephone Encounter (Signed)
Returned call to pt and advised him that it's ok for his wife to accompany him to appt, added note to visit for today.

## 2018-11-07 NOTE — Telephone Encounter (Signed)
New Message::    Pt says his wife will need to come in with him today for his appt for his Coumadin. She helps him with all of his medicine.

## 2018-11-07 NOTE — Patient Instructions (Addendum)
Description    Take 2 tablets daily, except for 3 tablets on Tuesdays, Thursdays and Saturdays.  Recheck INR in 2 weeks. Coumadin Clinic [phone] Main [phone] Goal to be 2.5 per d/c hospital d/c summary.

## 2018-11-08 ENCOUNTER — Other Ambulatory Visit: Payer: Self-pay

## 2018-11-08 ENCOUNTER — Encounter (HOSPITAL_COMMUNITY): Payer: Self-pay | Admitting: Nurse Practitioner

## 2018-11-08 ENCOUNTER — Telehealth: Payer: Self-pay

## 2018-11-08 ENCOUNTER — Ambulatory Visit (HOSPITAL_COMMUNITY)
Admission: RE | Admit: 2018-11-08 | Discharge: 2018-11-08 | Disposition: A | Discharge status: Home / Self Care | Payer: Medicare HMO | Source: Ambulatory Visit | Attending: Physician Assistant | Admitting: Physician Assistant

## 2018-11-08 VITALS — BP 114/82 | HR 109 | Ht 69.0 in | Wt 215.6 lb

## 2018-11-08 DIAGNOSIS — I4892 Unspecified atrial flutter: Secondary | ICD-10-CM | POA: Diagnosis not present

## 2018-11-08 DIAGNOSIS — Z7982 Long term (current) use of aspirin: Secondary | ICD-10-CM | POA: Diagnosis not present

## 2018-11-08 DIAGNOSIS — Z7901 Long term (current) use of anticoagulants: Secondary | ICD-10-CM | POA: Diagnosis not present

## 2018-11-08 DIAGNOSIS — I2510 Atherosclerotic heart disease of native coronary artery without angina pectoris: Secondary | ICD-10-CM | POA: Insufficient documentation

## 2018-11-08 DIAGNOSIS — E785 Hyperlipidemia, unspecified: Secondary | ICD-10-CM | POA: Insufficient documentation

## 2018-11-08 DIAGNOSIS — Z79899 Other long term (current) drug therapy: Secondary | ICD-10-CM | POA: Diagnosis not present

## 2018-11-08 DIAGNOSIS — Z87891 Personal history of nicotine dependence: Secondary | ICD-10-CM | POA: Insufficient documentation

## 2018-11-08 DIAGNOSIS — E669 Obesity, unspecified: Secondary | ICD-10-CM | POA: Diagnosis not present

## 2018-11-08 DIAGNOSIS — Z950 Presence of cardiac pacemaker: Secondary | ICD-10-CM | POA: Diagnosis not present

## 2018-11-08 DIAGNOSIS — I050 Rheumatic mitral stenosis: Secondary | ICD-10-CM | POA: Diagnosis not present

## 2018-11-08 DIAGNOSIS — Z951 Presence of aortocoronary bypass graft: Secondary | ICD-10-CM | POA: Diagnosis not present

## 2018-11-08 NOTE — Progress Notes (Signed)
Primary Care Physician: Denita Lung, MD Referring Physician: Dr. Rayann Heman Device clinic    [name] is a 68 y.o. male with  aortic stenosis, mitral stenosis, CAD s/p recent CABGx2, pericardial AVR + MVR + PFO closure, 10/11/18, c/b post-op bradycardia requiring PPM and atrial flutter, aortic ectasia, pulmonary nodules otherwise tobacco abuse, hyperlipidemia andesophageal stricture.    He is in the afib clinic to be considered for cardioversion as he has had persistent atrial flutter since surgery. The pt was started on coumadin at time of surgery and just yesterday had the first therapeutic INR of 2.0. He  feels better every week regaining his strength.   Today, he denies symptoms of palpitations, chest pain, shortness of breath, orthopnea, PND, lower extremity edema, dizziness, presyncope, syncope, or neurologic sequela. The patient is tolerating medications without difficulties and is otherwise without complaint today.   Past Medical History:  Diagnosis Date  . BCE (basal cell epithelioma), arm    RIGHT SHOULDER  . Bradycardia   . CAD (coronary artery disease)    a. s/p CABGx2 10/11/18 LIMA to LAD, SVG to RCA, EVH via right thigh.  . Dyslipidemia   . Ectatic thoracic aorta ([place])   . Esophageal stricture   . Hyperlipidemia   . Incidental pulmonary nodule, > 57mm and < 57mm 10/04/2018   Noted on CTA  . Inguinal hernia    bilateral  . Obesity   . S/P aortic valve replacement with bioprosthetic valve 10/11/2018   23 mm Edwards Inspiris Resilia stented bovine pericardial tissue valve  . S/P CABG x 2 10/11/2018   LIMA to LAD, SVG to RCA, EVH via right thigh  . S/P mitral valve replacement with bioprosthetic valve 10/11/2018   27 mm Medtronic Mosaic stented porcine bioprosthetic tissue valve  . S/P patent foramen ovale closure 10/11/2018  . S/P placement of cardiac pacemaker   . Smoker    Past Surgical History:  Procedure Laterality Date  . AORTIC VALVE REPLACEMENT N/A 10/11/2018   Procedure: AORTIC VALVE REPLACEMENT (AVR) with 23 Inspiris Bioprosthetic Aortic valve.;  Surgeon: Rexene Alberts, MD;  Location: [place];  Service: Open Heart Surgery;  Laterality: N/A;  . CLIPPING OF ATRIAL APPENDAGE N/A 10/11/2018   Procedure: CLIPPING OF LEFT ATRIAL APPENDAGE with 45 AtriCure Clip.;  Surgeon: Rexene Alberts, MD;  Location: [address];  Service: Open Heart Surgery;  Laterality: N/A;  . COLONOSCOPY  03/2008,2004   Dr. Amedeo Plenty  . CORONARY ARTERY BYPASS GRAFT N/A 10/11/2018   Procedure: CORONARY ARTERY BYPASS GRAFTING (CABG) x 2 using LIMA to the LAD and endoscopic greater saphenous vein harvesting to the RCA.;  Surgeon: Rexene Alberts, MD;  Location: [place];  Service: Open Heart Surgery;  Laterality: N/A;  . HERNIA REPAIR  12/11/10   BIH  . MITRAL VALVE REPLACEMENT N/A 10/11/2018   Procedure: MITRAL VALVE (MV) REPLACEMENT with 27 Mosaic Bioprosthetic Mitral valve.;  Surgeon: Rexene Alberts, MD;  Location: [place];  Service: Open Heart Surgery;  Laterality: N/A;  . PACEMAKER IMPLANT N/A 10/17/2018   Procedure: PACEMAKER IMPLANT;  Surgeon: Thompson Grayer, MD;  Location: [place] [facility];  Service: Cardiovascular;  Laterality: N/A;  . REPAIR OF PATENT FORAMEN OVALE N/A 10/11/2018   Procedure: CLOSURE OF PATENT FORAMEN OVALE;  Surgeon: Rexene Alberts, MD;  Location: [place];  Service: Open Heart Surgery;  Laterality: N/A;  . RIGHT/LEFT HEART CATH AND CORONARY ANGIOGRAPHY N/A 09/15/2018   Procedure: RIGHT/LEFT HEART CATH AND CORONARY ANGIOGRAPHY;  Surgeon: Burnell Blanks, MD;  Location: [place] [facility];  Service: Cardiovascular;  Laterality: N/A;  . TEE WITHOUT CARDIOVERSION N/A 12/12/2015   Procedure: TRANSESOPHAGEAL ECHOCARDIOGRAM (TEE);  Surgeon: Sanda Klein, MD;  Location: [place];  Service: Cardiovascular;  Laterality: N/A;  . TEE WITHOUT CARDIOVERSION N/A 10/11/2018   Procedure: TRANSESOPHAGEAL ECHOCARDIOGRAM (TEE);  Surgeon: Rexene Alberts, MD;  Location: [place];  Service:  Open Heart Surgery;  Laterality: N/A;  . THYMECTOMY  1976    Current Outpatient Medications  Medication Sig Dispense Refill  . aspirin 81 MG tablet Take 81 mg by mouth every other day.     Marland Kitchen atorvastatin (LIPITOR) 20 MG tablet Take 1 tablet (20 mg total) by mouth daily at 6 PM. 30 tablet 2  . metoprolol tartrate (LOPRESSOR) 25 MG tablet Take 1 tablet (25 mg total) by mouth 2 (two) times daily. 60 tablet 2  . oxymetazoline (AFRIN) 0.05 % nasal spray Place 1 spray into both nostrils at bedtime as needed for congestion.    Marland Kitchen warfarin (COUMADIN) 2.5 MG tablet Take 2 tablets (5 mg total) by mouth daily. or as directed by the Coumadin Clinic 30 tablet 11   No current facility-administered medications for this encounter.     Allergies  Allergen Reactions  . Tylenol [Acetaminophen] Nausea Only    Social History   Socioeconomic History  . Marital status: Married    Spouse name: Not on file  . Number of children: 0  . Years of education: Not on file  . Highest education level: Not on file  Occupational History  . Occupation: Programme researcher, broadcasting/film/video: [employer]  Social Needs  . Financial resource strain: Not on file  . Food insecurity    Worry: Not on file    Inability: Not on file  . Transportation needs    Medical: Not on file    Non-medical: Not on file  Tobacco Use  . Smoking status: Former Smoker    Packs/day: 0.25    Years: 35.00    Pack years: 8.75    Types: Cigarettes    Quit date: 09/04/2018    Years since quitting: 0.1  . Smokeless tobacco: Never Used  Substance and Sexual Activity  . Alcohol use: Yes    Alcohol/week: 1.0 standard drinks    Types: 1 Cans of beer per week  . Drug use: No  . Sexual activity: Yes  Lifestyle  . Physical activity    Days per week: Not on file    Minutes per session: Not on file  . Stress: Not on file  Relationships  . Social Herbalist on phone: Not on file    Gets together: Not on file    Attends religious service:  Not on file    Active member of club or organization: Not on file    Attends meetings of clubs or organizations: Not on file    Relationship status: Not on file  . Intimate partner violence    Fear of current or ex partner: Not on file    Emotionally abused: Not on file    Physically abused: Not on file    Forced sexual activity: Not on file  Other Topics Concern  . Not on file  Social History Narrative  . Not on file    Family History  Problem Relation Age of Onset  . Cancer Mother 26       colon  . Cancer Father 58  lung  . CAD Neg Hx     ROS- All systems are reviewed and negative except as per the HPI above  Physical Exam: Vitals:   11/08/18 1318  BP: 114/82  Pulse: (!) 109  Weight: 97.8 kg  Height: 5\' 9"  (1.753 m)   Wt Readings from Last 3 Encounters:  11/08/18 97.8 kg  11/02/18 98.4 kg  10/20/18 107.9 kg    Labs: Lab Results  Component Value Date   NA 137 11/02/2018   K 4.5 11/02/2018   CL 102 11/02/2018   CO2 25 11/02/2018   GLUCOSE 146 (H) 11/02/2018   BUN 15 11/02/2018   CREATININE 0.88 11/02/2018   CALCIUM 9.1 11/02/2018   MG 2.5 (H) 10/13/2018   Lab Results  Component Value Date   INR 2.0 11/07/2018   Lab Results  Component Value Date   CHOL 237 (H) 04/28/2018   HDL 63 04/28/2018   LDLCALC 155 (H) 04/28/2018   TRIG 96 04/28/2018     GEN- The patient is well appearing, alert and oriented x 3 today.   Head- normocephalic, atraumatic Eyes-  Sclera clear, conjunctiva pink Ears- hearing intact Oropharynx- clear Neck- supple, no JVP Lymph- no cervical lymphadenopathy Lungs- Clear to ausculation bilaterally, normal work of breathing Heart- Regular rate and rhythm, no murmurs, rubs or gallops, PMI not laterally displaced GI- soft, NT, ND, + BS Extremities- no clubbing, cyanosis, or edema MS- no significant deformity or atrophy Skin- no rash or lesion Psych- euthymic mood, full affect Neuro- strength and sensation are intact   EKG-a sensed/v paced with prolonged AV conduction at 109 ms, 232 ms, qrs int 152 ms, qtc 538 ms Device interrogated with Medtronic and appears to be a likely atrial tach at 109 bpm   Assessment and Plan: 1. Atrial  flutter/possibly a tach today by interrogation  Arrhythmia  appears  to be persistent since surgery Continue metoprolol tartrate  25 mg bid  Pt is tolerating well Unfortunately just Tuesday had his first therapeutic INR of 2.0  Explained to pt that we will need 4 weeks of therapeutic INR's before cardioversion can be attempted I will notify coumadin clinic that he will need weekly INR's TEE DCCV was offered but pt feels he is tolerating and can wait a few more weeks  Will bring back 10/5 and he will send PPM report the day before clinic visit to further review arrhythmia  Butch Penny C. Mileigh Tilley, [facility] [address] [place], [place] [address] [phone]

## 2018-11-08 NOTE — Telephone Encounter (Signed)
Received a fax from [facility] program.  Fax was requesting H/P, D/C summary, cath and echo report, medication list, last offive visit note, and lipid values.  Unable to complete fax to [facility] due to patient not seen in the office for first post-op appointment.  Would be able to send when patient is seen in the office.

## 2018-11-08 NOTE — Patient Instructions (Addendum)
Coumadin clinic will call you regarding weekly INRS  Send remote transmission day before appt

## 2018-11-09 ENCOUNTER — Other Ambulatory Visit: Payer: Self-pay | Admitting: Thoracic Surgery (Cardiothoracic Vascular Surgery)

## 2018-11-09 DIAGNOSIS — Z951 Presence of aortocoronary bypass graft: Secondary | ICD-10-CM

## 2018-11-13 ENCOUNTER — Ambulatory Visit (INDEPENDENT_AMBULATORY_CARE_PROVIDER_SITE_OTHER): Payer: Medicare HMO | Admitting: *Deleted

## 2018-11-13 ENCOUNTER — Ambulatory Visit
Admission: RE | Admit: 2018-11-13 | Discharge: 2018-11-13 | Disposition: A | Discharge status: Home / Self Care | Payer: Medicare HMO | Source: Ambulatory Visit | Attending: Thoracic Surgery (Cardiothoracic Vascular Surgery) | Admitting: Thoracic Surgery (Cardiothoracic Vascular Surgery)

## 2018-11-13 ENCOUNTER — Encounter (HOSPITAL_COMMUNITY): Payer: Self-pay | Admitting: Thoracic Surgery (Cardiothoracic Vascular Surgery)

## 2018-11-13 ENCOUNTER — Ambulatory Visit (INDEPENDENT_AMBULATORY_CARE_PROVIDER_SITE_OTHER): Payer: Self-pay | Admitting: Physician Assistant

## 2018-11-13 ENCOUNTER — Other Ambulatory Visit: Payer: Self-pay

## 2018-11-13 ENCOUNTER — Encounter: Payer: Self-pay | Admitting: *Deleted

## 2018-11-13 VITALS — BP 101/68 | HR 103 | Temp 97.7°F | Resp 16 | Ht 69.0 in | Wt 216.0 lb

## 2018-11-13 DIAGNOSIS — Z953 Presence of xenogenic heart valve: Secondary | ICD-10-CM | POA: Diagnosis not present

## 2018-11-13 DIAGNOSIS — E782 Mixed hyperlipidemia: Secondary | ICD-10-CM | POA: Diagnosis not present

## 2018-11-13 DIAGNOSIS — Z5181 Encounter for therapeutic drug level monitoring: Secondary | ICD-10-CM | POA: Diagnosis not present

## 2018-11-13 DIAGNOSIS — Z8774 Personal history of (corrected) congenital malformations of heart and circulatory system: Secondary | ICD-10-CM | POA: Diagnosis not present

## 2018-11-13 DIAGNOSIS — I442 Atrioventricular block, complete: Secondary | ICD-10-CM | POA: Diagnosis not present

## 2018-11-13 DIAGNOSIS — T8131XD Disruption of external operation (surgical) wound, not elsewhere classified, subsequent encounter: Secondary | ICD-10-CM | POA: Diagnosis not present

## 2018-11-13 DIAGNOSIS — Z951 Presence of aortocoronary bypass graft: Secondary | ICD-10-CM

## 2018-11-13 DIAGNOSIS — F17210 Nicotine dependence, cigarettes, uncomplicated: Secondary | ICD-10-CM | POA: Diagnosis not present

## 2018-11-13 DIAGNOSIS — I4892 Unspecified atrial flutter: Secondary | ICD-10-CM

## 2018-11-13 DIAGNOSIS — Z9889 Other specified postprocedural states: Secondary | ICD-10-CM

## 2018-11-13 DIAGNOSIS — I9789 Other postprocedural complications and disorders of the circulatory system, not elsewhere classified: Secondary | ICD-10-CM | POA: Diagnosis not present

## 2018-11-13 DIAGNOSIS — R911 Solitary pulmonary nodule: Secondary | ICD-10-CM | POA: Diagnosis not present

## 2018-11-13 DIAGNOSIS — I2510 Atherosclerotic heart disease of native coronary artery without angina pectoris: Secondary | ICD-10-CM | POA: Diagnosis not present

## 2018-11-13 DIAGNOSIS — R918 Other nonspecific abnormal finding of lung field: Secondary | ICD-10-CM | POA: Diagnosis not present

## 2018-11-13 DIAGNOSIS — D62 Acute posthemorrhagic anemia: Secondary | ICD-10-CM | POA: Diagnosis not present

## 2018-11-13 LAB — POCT INR: INR: 2.5 (ref 2.0–3.0)

## 2018-11-13 NOTE — Progress Notes (Signed)
[address]       [place] [address]             [phone]      [name] is a 68 y.o. male patient who is status post AVR, MVR, closure of a PFO, and coronary bypass grafting x2 with Dr. Roxy Manns on 10/11/2018.  His hospitalization was complicated by the need for a permanent pacemaker.  He had some a flutter after his pacemaker was placed therefore he was started on a beta-blocker.  He had been started on Coumadin for his multi valvular surgery.  His most recent INR was 2.0 which was last week.  He has an INR drawn today  which has not resulted yet.  He saw Melina Copa, PA-C in the clinic recently and was doing well other than his rhythm.  He was seen in the atrial fibrillation clinic on 11/08/2018 and found to be in atrial flutter.  Cardioversion is recommended however, his first therapeutic INR was last week.  He will need 4 weeks of a therapeutic INR before cardioversion.  The atrial fibrillation clinic is following him closely as well as Dr. Camillia Herter office. He presents to the office today for his routine follow-up appointment.   1. S/P aortic + mitral valve replacement with bioprosthetic valves + CABG x2   2. S/P left atrial appendage ligation   3. S/P patent foramen ovale closure    Past Medical History:  Diagnosis Date  . BCE (basal cell epithelioma), arm    RIGHT SHOULDER  . Bradycardia   . CAD (coronary artery disease)    a. s/p CABGx2 10/11/18 LIMA to LAD, SVG to RCA, EVH via right thigh.  . Dyslipidemia   . Ectatic thoracic aorta ([place])   . Esophageal stricture   . Hyperlipidemia   . Incidental pulmonary nodule, > 20mm and < 28mm 10/04/2018   Noted on CTA  . Inguinal hernia    bilateral  . Obesity   . S/P aortic valve replacement with bioprosthetic valve 10/11/2018   23 mm Edwards Inspiris Resilia stented bovine pericardial tissue valve  . S/P CABG x 2 10/11/2018   LIMA to LAD, SVG to RCA, EVH via right thigh  . S/P mitral valve replacement with  bioprosthetic valve 10/11/2018   27 mm Medtronic Mosaic stented porcine bioprosthetic tissue valve  . S/P patent foramen ovale closure 10/11/2018  . S/P placement of cardiac pacemaker   . Smoker    No past surgical history pertinent negatives on file. Scheduled Meds: Current Outpatient Medications on File Prior to Visit  Medication Sig Dispense Refill  . aspirin 81 MG tablet Take 81 mg by mouth every other day.     Marland Kitchen atorvastatin (LIPITOR) 20 MG tablet Take 1 tablet (20 mg total) by mouth daily at 6 PM. 30 tablet 2  . metoprolol tartrate (LOPRESSOR) 25 MG tablet Take 1 tablet (25 mg total) by mouth 2 (two) times daily. 60 tablet 2  . oxymetazoline (AFRIN) 0.05 % nasal spray Place 1 spray into both nostrils at bedtime as needed for congestion.    Marland Kitchen warfarin (COUMADIN) 2.5 MG tablet Take 2 tablets (5 mg total) by mouth daily. or as directed by the Coumadin Clinic 30 tablet 11   No current facility-administered medications on file prior to visit.     Allergies  Allergen Reactions  . Tylenol [Acetaminophen] Nausea Only   Active Problems:   * No active hospital problems. *  Blood  pressure 101/68, pulse (!) 103, temperature 97.7 F (36.5 C), resp. rate 16, height 5\' 9"  (1.753 m), weight 216 lb (98 kg), SpO2 94 %.  Subjective  [name] presents to the clinic today for his routine follow-up appointment.  Objective  Cor: sinus tachycardia Pulm: CTA bilaterally and in all fields Abd: no tenderness Wound: sternotomy incision is healing well without drainage. Right EVH site is c/d/i Ext: no edema   Chest Xray:   CLINICAL DATA:  S/p CABG 10/11/2018, no chest complaints today, stat reading, pt gone to office now for appt  EXAM: CHEST - 2 VIEW  COMPARISON:  Chest radiograph 10/18/2018, 10/16/2018  FINDINGS: Stable cardiomediastinal contours status post median sternotomy and CABG. Left chest ICD in place. No definite residual right-sided pneumothorax. Mild opacities at the  left base likely reflect atelectasis. Right lung is clear. No large pleural effusion. No acute finding in the visualized skeleton.  IMPRESSION: 1. No definite residual right-sided pneumothorax. 2. Minimal left basilar atelectasis or scarring.   Electronically Signed   By: Audie Pinto M.D.   On: 11/13/2018 12:50  Assessment & Plan    [name] is a 68 year old male patient who presents today for a routine follow-up visit. He is s/ p MVR, AVR, CABG x2, closure of a PFO, and left atrial appendage clipping on 9/2.  He has overall been doing really well postoperatively.  He has had limited chest pains around his incision, no shortness of breath, and he has been completing activities successfully.  His only setback has been his rhythm.  He has been in a flutter which cardiology is working on controlling.  He is on Coumadin and metoprolol.  His INR was therapeutic last week. He asked about alcohol while on coumadin. I will defer to this conversation for the coumadin clinic today. He is cleared to drive since he is no longer requiring pain medication. He is attending rehab starting tomorrow. He was reminded about taking antibiotics before dental cleanings. I answered all the patient's questions to his satisfaction.   Follow-up with Dr. Roxy Manns in 2 months.    No new perscriptions today  [name] 11/13/2018

## 2018-11-13 NOTE — Patient Instructions (Signed)
Description    Take 2 tablets daily, except for 3 tablets on Tuesdays, Thursdays and Saturdays.  Recheck INR in 1 week pending DCCV. Coumadin Clinic [phone] Main [phone] Goal to be 2.5 per d/c hospital d/c summary.

## 2018-11-13 NOTE — Progress Notes (Unsigned)
Signed cardiac rehab referral form faxed to [facility] [phone] at this time.  Form to HIM to be scanned into chart.

## 2018-11-13 NOTE — Patient Instructions (Signed)
Make every effort to stay physically active, get some type of exercise on a regular basis, and stick to a "heart healthy diet".  The long term benefits for regular exercise and a healthy diet are critically important to your overall health and wellbeing.  You are encouraged to enroll and participate in the outpatient cardiac rehab program beginning as soon as practical.  You may return to driving an automobile as long as you are no longer requiring oral narcotic pain relievers during the daytime.  It would be wise to start driving only short distances during the daylight and gradually increase from there as you feel comfortable.  Endocarditis is a potentially serious infection of heart valves or inside lining of the heart.  It occurs more commonly in patients with diseased heart valves (such as patient's with aortic or mitral valve disease) and in patients who have undergone heart valve repair or replacement.  Certain surgical and dental procedures may put you at risk, such as dental cleaning, other dental procedures, or any surgery involving the respiratory, urinary, gastrointestinal tract, gallbladder or prostate gland.   To minimize your chances for develooping endocarditis, maintain good oral health and seek prompt medical attention for any infections involving the mouth, teeth, gums, skin or urinary tract.    Always notify your doctor or dentist about your underlying heart valve condition before having any invasive procedures. You will need to take antibiotics before certain procedures, including all routine dental cleanings or other dental procedures.  Your cardiologist or dentist should prescribe these antibiotics for you to be taken ahead of time.

## 2018-11-14 DIAGNOSIS — Z951 Presence of aortocoronary bypass graft: Secondary | ICD-10-CM | POA: Diagnosis not present

## 2018-11-14 DIAGNOSIS — Z954 Presence of other heart-valve replacement: Secondary | ICD-10-CM | POA: Diagnosis not present

## 2018-11-14 DIAGNOSIS — I4892 Unspecified atrial flutter: Secondary | ICD-10-CM | POA: Diagnosis not present

## 2018-11-15 DIAGNOSIS — I4892 Unspecified atrial flutter: Secondary | ICD-10-CM | POA: Diagnosis not present

## 2018-11-15 DIAGNOSIS — Z954 Presence of other heart-valve replacement: Secondary | ICD-10-CM | POA: Diagnosis not present

## 2018-11-15 DIAGNOSIS — Z951 Presence of aortocoronary bypass graft: Secondary | ICD-10-CM | POA: Diagnosis not present

## 2018-11-17 DIAGNOSIS — Z954 Presence of other heart-valve replacement: Secondary | ICD-10-CM | POA: Diagnosis not present

## 2018-11-17 DIAGNOSIS — Z951 Presence of aortocoronary bypass graft: Secondary | ICD-10-CM | POA: Diagnosis not present

## 2018-11-17 DIAGNOSIS — I4892 Unspecified atrial flutter: Secondary | ICD-10-CM | POA: Diagnosis not present

## 2018-11-20 ENCOUNTER — Ambulatory Visit (INDEPENDENT_AMBULATORY_CARE_PROVIDER_SITE_OTHER): Payer: Medicare HMO | Admitting: Pharmacist

## 2018-11-20 ENCOUNTER — Other Ambulatory Visit: Payer: Self-pay

## 2018-11-20 ENCOUNTER — Encounter (HOSPITAL_COMMUNITY): Payer: Medicare HMO | Admitting: Physician Assistant

## 2018-11-20 DIAGNOSIS — Z954 Presence of other heart-valve replacement: Secondary | ICD-10-CM | POA: Diagnosis not present

## 2018-11-20 DIAGNOSIS — Z953 Presence of xenogenic heart valve: Secondary | ICD-10-CM

## 2018-11-20 DIAGNOSIS — Z951 Presence of aortocoronary bypass graft: Secondary | ICD-10-CM

## 2018-11-20 DIAGNOSIS — Z5181 Encounter for therapeutic drug level monitoring: Secondary | ICD-10-CM

## 2018-11-20 DIAGNOSIS — I4892 Unspecified atrial flutter: Secondary | ICD-10-CM | POA: Diagnosis not present

## 2018-11-20 DIAGNOSIS — Z8774 Personal history of (corrected) congenital malformations of heart and circulatory system: Secondary | ICD-10-CM | POA: Diagnosis not present

## 2018-11-20 LAB — POCT INR: INR: 2.8 (ref 2.0–3.0)

## 2018-11-20 NOTE — Patient Instructions (Signed)
Description   Continue to take 2 tablets daily, except for 3 tablets on Tuesdays, Thursdays and Saturdays.  Recheck INR in 1 week pending DCCV. Coumadin Clinic [phone] Main [phone].

## 2018-11-22 DIAGNOSIS — Z954 Presence of other heart-valve replacement: Secondary | ICD-10-CM | POA: Diagnosis not present

## 2018-11-22 DIAGNOSIS — Z951 Presence of aortocoronary bypass graft: Secondary | ICD-10-CM | POA: Diagnosis not present

## 2018-11-22 DIAGNOSIS — I4892 Unspecified atrial flutter: Secondary | ICD-10-CM | POA: Diagnosis not present

## 2018-11-23 ENCOUNTER — Other Ambulatory Visit: Payer: Self-pay

## 2018-11-23 ENCOUNTER — Ambulatory Visit (HOSPITAL_COMMUNITY)
Admission: RE | Admit: 2018-11-23 | Discharge: 2018-11-23 | Disposition: A | Discharge status: Home / Self Care | Payer: Medicare HMO | Source: Ambulatory Visit | Attending: Nurse Practitioner | Admitting: Nurse Practitioner

## 2018-11-23 VITALS — BP 124/74 | HR 92 | Ht 69.0 in | Wt 220.0 lb

## 2018-11-23 DIAGNOSIS — Z953 Presence of xenogenic heart valve: Secondary | ICD-10-CM | POA: Diagnosis not present

## 2018-11-23 DIAGNOSIS — Z950 Presence of cardiac pacemaker: Secondary | ICD-10-CM | POA: Diagnosis not present

## 2018-11-23 DIAGNOSIS — Z7901 Long term (current) use of anticoagulants: Secondary | ICD-10-CM | POA: Diagnosis not present

## 2018-11-23 DIAGNOSIS — Z7982 Long term (current) use of aspirin: Secondary | ICD-10-CM | POA: Diagnosis not present

## 2018-11-23 DIAGNOSIS — Z801 Family history of malignant neoplasm of trachea, bronchus and lung: Secondary | ICD-10-CM | POA: Insufficient documentation

## 2018-11-23 DIAGNOSIS — Z87891 Personal history of nicotine dependence: Secondary | ICD-10-CM | POA: Diagnosis not present

## 2018-11-23 DIAGNOSIS — E669 Obesity, unspecified: Secondary | ICD-10-CM | POA: Insufficient documentation

## 2018-11-23 DIAGNOSIS — I2510 Atherosclerotic heart disease of native coronary artery without angina pectoris: Secondary | ICD-10-CM | POA: Insufficient documentation

## 2018-11-23 DIAGNOSIS — E785 Hyperlipidemia, unspecified: Secondary | ICD-10-CM | POA: Insufficient documentation

## 2018-11-23 DIAGNOSIS — Z800 Family history of malignant neoplasm of digestive organs: Secondary | ICD-10-CM | POA: Diagnosis not present

## 2018-11-23 DIAGNOSIS — I4891 Unspecified atrial fibrillation: Secondary | ICD-10-CM | POA: Diagnosis present

## 2018-11-23 DIAGNOSIS — Z79899 Other long term (current) drug therapy: Secondary | ICD-10-CM | POA: Diagnosis not present

## 2018-11-23 DIAGNOSIS — Z6832 Body mass index (BMI) 32.0-32.9, adult: Secondary | ICD-10-CM | POA: Insufficient documentation

## 2018-11-23 DIAGNOSIS — Z951 Presence of aortocoronary bypass graft: Secondary | ICD-10-CM | POA: Insufficient documentation

## 2018-11-23 DIAGNOSIS — I4892 Unspecified atrial flutter: Secondary | ICD-10-CM | POA: Diagnosis not present

## 2018-11-23 NOTE — Progress Notes (Signed)
Primary Care Physician: Denita Lung, MD Referring Physician: Dr. Rayann Heman Device clinic    CRISHAWN SEGEL is a 68 y.o. male with  aortic stenosis, mitral stenosis, CAD s/p recent CABGx2, pericardial AVR + MVR + PFO closure, 10/11/18, c/b post-op bradycardia requiring PPM and atrial flutter, aortic ectasia, pulmonary nodules, otherwise tobacco abuse, hyperlipidemia andesophageal stricture.    He is in the afib clinic to be considered for cardioversion as he has had persistent atrial flutter since surgery. The pt was started on coumadin at time of surgery and just yesterday had the first therapeutic INR of 2.0. He  feels better every week regaining his strength.   F/u in afib clinic, 10/15,  for consideration for cardioversion as he has now had 3 therapeutic  INR results. He is feeling stronger than when I saw him last. His ekg looks to be a sensed and v paced, which  would indicate that he is in SR. He has sent a remote report form his phone app while here and it did show that he has been back in [place] since  October .  Today, he denies symptoms of palpitations, chest pain, shortness of breath, orthopnea, PND, lower extremity edema, dizziness, presyncope, syncope, or neurologic sequela. The patient is tolerating medications without difficulties and is otherwise without complaint today.   Past Medical History:  Diagnosis Date  . BCE (basal cell epithelioma), arm    RIGHT SHOULDER  . Bradycardia   . CAD (coronary artery disease)    a. s/p CABGx2 10/11/18 LIMA to LAD, SVG to RCA, EVH via right thigh.  . Dyslipidemia   . Ectatic thoracic aorta ([place])   . Esophageal stricture   . Hyperlipidemia   . Incidental pulmonary nodule, > 28mm and < 47mm 10/04/2018   Noted on CTA  . Inguinal hernia    bilateral  . Obesity   . S/P aortic valve replacement with bioprosthetic valve 10/11/2018   23 mm Edwards Inspiris Resilia stented bovine pericardial tissue valve  . S/P CABG x 2 10/11/2018   LIMA to LAD, SVG  to RCA, EVH via right thigh  . S/P mitral valve replacement with bioprosthetic valve 10/11/2018   27 mm Medtronic Mosaic stented porcine bioprosthetic tissue valve  . S/P patent foramen ovale closure 10/11/2018  . S/P placement of cardiac pacemaker   . Smoker    Past Surgical History:  Procedure Laterality Date  . AORTIC VALVE REPLACEMENT N/A 10/11/2018   Procedure: AORTIC VALVE REPLACEMENT (AVR) with 23 Inspiris Bioprosthetic Aortic valve.;  Surgeon: Rexene Alberts, MD;  Location: [place];  Service: Open Heart Surgery;  Laterality: N/A;  . CLIPPING OF ATRIAL APPENDAGE N/A 10/11/2018   Procedure: CLIPPING OF LEFT ATRIAL APPENDAGE with 45 AtriCure Clip.;  Surgeon: Rexene Alberts, MD;  Location: [place];  Service: Open Heart Surgery;  Laterality: N/A;  . COLONOSCOPY  03/2008,2004   Dr. Amedeo Plenty  . CORONARY ARTERY BYPASS GRAFT N/A 10/11/2018   Procedure: CORONARY ARTERY BYPASS GRAFTING (CABG) x 2 using LIMA to the LAD and endoscopic greater saphenous vein harvesting to the RCA.;  Surgeon: Rexene Alberts, MD;  Location: [place];  Service: Open Heart Surgery;  Laterality: N/A;  . HERNIA REPAIR  12/11/10   BIH  . MITRAL VALVE REPLACEMENT N/A 10/11/2018   Procedure: MITRAL VALVE (MV) REPLACEMENT with 27 Mosaic Bioprosthetic Mitral valve.;  Surgeon: Rexene Alberts, MD;  Location: [place];  Service: Open Heart Surgery;  Laterality: N/A;  . PACEMAKER IMPLANT  N/A 10/17/2018   Procedure: PACEMAKER IMPLANT;  Surgeon: Thompson Grayer, MD;  Location: [facility] [facility];  Service: Cardiovascular;  Laterality: N/A;  . REPAIR OF PATENT FORAMEN OVALE N/A 10/11/2018   Procedure: CLOSURE OF PATENT FORAMEN OVALE;  Surgeon: Rexene Alberts, MD;  Location: [place];  Service: Open Heart Surgery;  Laterality: N/A;  . RIGHT/LEFT HEART CATH AND CORONARY ANGIOGRAPHY N/A 09/15/2018   Procedure: RIGHT/LEFT HEART CATH AND CORONARY ANGIOGRAPHY;  Surgeon: Burnell Blanks, MD;  Location: [place] [facility];  Service: Cardiovascular;   Laterality: N/A;  . TEE WITHOUT CARDIOVERSION N/A 12/12/2015   Procedure: TRANSESOPHAGEAL ECHOCARDIOGRAM (TEE);  Surgeon: Sanda Klein, MD;  Location: [place];  Service: Cardiovascular;  Laterality: N/A;  . TEE WITHOUT CARDIOVERSION N/A 10/11/2018   Procedure: TRANSESOPHAGEAL ECHOCARDIOGRAM (TEE);  Surgeon: Rexene Alberts, MD;  Location: [place];  Service: Open Heart Surgery;  Laterality: N/A;  . THYMECTOMY  1976    Current Outpatient Medications  Medication Sig Dispense Refill  . aspirin 81 MG tablet Take 81 mg by mouth every other day.     Marland Kitchen atorvastatin (LIPITOR) 20 MG tablet Take 1 tablet (20 mg total) by mouth daily at 6 PM. 30 tablet 2  . calcium carbonate (TUMS - DOSED IN MG ELEMENTAL CALCIUM) 500 MG chewable tablet Chew 1 tablet by mouth as needed for indigestion or heartburn.    . metoprolol tartrate (LOPRESSOR) 25 MG tablet Take 1 tablet (25 mg total) by mouth 2 (two) times daily. 60 tablet 2  . oxymetazoline (AFRIN) 0.05 % nasal spray Place 1 spray into both nostrils at bedtime as needed for congestion.    Marland Kitchen warfarin (COUMADIN) 2.5 MG tablet Take 2 tablets (5 mg total) by mouth daily. or as directed by the Coumadin Clinic (Patient taking differently: Take 5 mg by mouth daily. or as directed by the Coumadin Clinic Takes Tuesday, Thursday and Saturday - 7.5mg  total The rest of the week - 5mg  total daily) 30 tablet 11   No current facility-administered medications for this encounter.     Allergies  Allergen Reactions  . Tylenol [Acetaminophen] Nausea Only    Social History   Socioeconomic History  . Marital status: Married    Spouse name: Not on file  . Number of children: 0  . Years of education: Not on file  . Highest education level: Not on file  Occupational History  . Occupation: Programme researcher, broadcasting/film/video: [employer]  Social Needs  . Financial resource strain: Not on file  . Food insecurity    Worry: Not on file    Inability: Not on file  . Transportation  needs    Medical: Not on file    Non-medical: Not on file  Tobacco Use  . Smoking status: Former Smoker    Packs/day: 0.25    Years: 35.00    Pack years: 8.75    Types: Cigarettes    Quit date: 09/04/2018    Years since quitting: 0.2  . Smokeless tobacco: Never Used  Substance and Sexual Activity  . Alcohol use: Yes    Alcohol/week: 1.0 standard drinks    Types: 1 Cans of beer per week  . Drug use: No  . Sexual activity: Yes  Lifestyle  . Physical activity    Days per week: Not on file    Minutes per session: Not on file  . Stress: Not on file  Relationships  . Social connections    Talks on phone: Not on  file    Gets together: Not on file    Attends religious service: Not on file    Active member of club or organization: Not on file    Attends meetings of clubs or organizations: Not on file    Relationship status: Not on file  . Intimate partner violence    Fear of current or ex partner: Not on file    Emotionally abused: Not on file    Physically abused: Not on file    Forced sexual activity: Not on file  Other Topics Concern  . Not on file  Social History Narrative  . Not on file    Family History  Problem Relation Age of Onset  . Cancer Mother 8       colon  . Cancer Father 92       lung  . CAD Neg Hx     ROS- All systems are reviewed and negative except as per the HPI above  Physical Exam: Vitals:   11/23/18 1450  BP: 124/74  Pulse: 92  Weight: 99.8 kg  Height: 5\' 9"  (1.753 m)   Wt Readings from Last 3 Encounters:  11/23/18 99.8 kg  11/13/18 98 kg  11/08/18 97.8 kg    Labs: Lab Results  Component Value Date   NA 137 11/02/2018   K 4.5 11/02/2018   CL 102 11/02/2018   CO2 25 11/02/2018   GLUCOSE 146 (H) 11/02/2018   BUN 15 11/02/2018   CREATININE 0.88 11/02/2018   CALCIUM 9.1 11/02/2018   MG 2.5 (H) 10/13/2018   Lab Results  Component Value Date   INR 2.8 11/20/2018   Lab Results  Component Value Date   CHOL 237 (H) 04/28/2018    HDL 63 04/28/2018   LDLCALC 155 (H) 04/28/2018   TRIG 96 04/28/2018     GEN- The patient is well appearing, alert and oriented x 3 today.   Head- normocephalic, atraumatic Eyes-  Sclera clear, conjunctiva pink Ears- hearing intact Oropharynx- clear Neck- supple, no JVP Lymph- no cervical lymphadenopathy Lungs- Clear to ausculation bilaterally, normal work of breathing Heart- Regular rate and rhythm, no murmurs, rubs or gallops, PMI not laterally displaced GI- soft, NT, ND, + BS Extremities- no clubbing, cyanosis, or edema MS- no significant deformity or atrophy Skin- no rash or lesion Psych- euthymic mood, full affect Neuro- strength and sensation are intact  EKG-a sensed/v paced at 92 bpm Device clinic- paceart report 10/15-  Mr. Schlimgen presenting rhythm as of 10/15 at 15:05 was AS/VP @ 98bpm. No AT/AF since 11/08/18 transmission  Assessment and Plan: 1. Persistent  atrial  flutter since Cardiac surgery 10/2018 Now back in rhythm  Continue metoprolol tartrate 25 mg bid  Will message coumadin clinic to let them know that he will no longer need weekly INR's [place] coumadin with [id number] score of 2  F/u with with cardiology as scheduled 11/5   Butch Penny C. Carroll, [facility] [address] [place], [place] [address] [phone]

## 2018-11-24 ENCOUNTER — Encounter (HOSPITAL_COMMUNITY): Payer: Self-pay | Admitting: Nurse Practitioner

## 2018-11-24 DIAGNOSIS — Z951 Presence of aortocoronary bypass graft: Secondary | ICD-10-CM | POA: Diagnosis not present

## 2018-11-24 DIAGNOSIS — Z954 Presence of other heart-valve replacement: Secondary | ICD-10-CM | POA: Diagnosis not present

## 2018-11-24 DIAGNOSIS — I4892 Unspecified atrial flutter: Secondary | ICD-10-CM | POA: Diagnosis not present

## 2018-11-27 DIAGNOSIS — Z954 Presence of other heart-valve replacement: Secondary | ICD-10-CM | POA: Diagnosis not present

## 2018-11-27 DIAGNOSIS — Z951 Presence of aortocoronary bypass graft: Secondary | ICD-10-CM | POA: Diagnosis not present

## 2018-11-27 DIAGNOSIS — I4892 Unspecified atrial flutter: Secondary | ICD-10-CM | POA: Diagnosis not present

## 2018-11-29 DIAGNOSIS — Z954 Presence of other heart-valve replacement: Secondary | ICD-10-CM | POA: Diagnosis not present

## 2018-11-29 DIAGNOSIS — I4892 Unspecified atrial flutter: Secondary | ICD-10-CM | POA: Diagnosis not present

## 2018-11-29 DIAGNOSIS — Z951 Presence of aortocoronary bypass graft: Secondary | ICD-10-CM | POA: Diagnosis not present

## 2018-11-30 ENCOUNTER — Other Ambulatory Visit: Payer: Medicare HMO

## 2018-12-01 DIAGNOSIS — Z954 Presence of other heart-valve replacement: Secondary | ICD-10-CM | POA: Diagnosis not present

## 2018-12-01 DIAGNOSIS — I4892 Unspecified atrial flutter: Secondary | ICD-10-CM | POA: Diagnosis not present

## 2018-12-01 DIAGNOSIS — Z951 Presence of aortocoronary bypass graft: Secondary | ICD-10-CM | POA: Diagnosis not present

## 2018-12-04 ENCOUNTER — Ambulatory Visit (INDEPENDENT_AMBULATORY_CARE_PROVIDER_SITE_OTHER): Payer: Medicare HMO

## 2018-12-04 ENCOUNTER — Other Ambulatory Visit: Payer: Self-pay

## 2018-12-04 DIAGNOSIS — Z5181 Encounter for therapeutic drug level monitoring: Secondary | ICD-10-CM

## 2018-12-04 DIAGNOSIS — Z951 Presence of aortocoronary bypass graft: Secondary | ICD-10-CM

## 2018-12-04 DIAGNOSIS — I4892 Unspecified atrial flutter: Secondary | ICD-10-CM | POA: Diagnosis not present

## 2018-12-04 DIAGNOSIS — Z954 Presence of other heart-valve replacement: Secondary | ICD-10-CM | POA: Diagnosis not present

## 2018-12-04 DIAGNOSIS — Z8774 Personal history of (corrected) congenital malformations of heart and circulatory system: Secondary | ICD-10-CM

## 2018-12-04 DIAGNOSIS — Z953 Presence of xenogenic heart valve: Secondary | ICD-10-CM | POA: Diagnosis not present

## 2018-12-04 LAB — POCT INR: INR: 1.8 — AB (ref 2.0–3.0)

## 2018-12-04 NOTE — Patient Instructions (Signed)
Description   Take 3 tablets today, then resume same dosage 2 tablets daily, except for 3 tablets on Tuesdays, Thursdays and Saturdays.  Recheck INR in 2 weeks . Coumadin Clinic [phone] Main [phone].

## 2018-12-06 DIAGNOSIS — I4892 Unspecified atrial flutter: Secondary | ICD-10-CM | POA: Diagnosis not present

## 2018-12-06 DIAGNOSIS — Z954 Presence of other heart-valve replacement: Secondary | ICD-10-CM | POA: Diagnosis not present

## 2018-12-06 DIAGNOSIS — Z951 Presence of aortocoronary bypass graft: Secondary | ICD-10-CM | POA: Diagnosis not present

## 2018-12-08 DIAGNOSIS — Z951 Presence of aortocoronary bypass graft: Secondary | ICD-10-CM | POA: Diagnosis not present

## 2018-12-08 DIAGNOSIS — Z954 Presence of other heart-valve replacement: Secondary | ICD-10-CM | POA: Diagnosis not present

## 2018-12-08 DIAGNOSIS — I4892 Unspecified atrial flutter: Secondary | ICD-10-CM | POA: Diagnosis not present

## 2018-12-11 ENCOUNTER — Other Ambulatory Visit: Payer: Self-pay | Admitting: Physician Assistant

## 2018-12-11 DIAGNOSIS — Z954 Presence of other heart-valve replacement: Secondary | ICD-10-CM | POA: Diagnosis not present

## 2018-12-11 DIAGNOSIS — Z951 Presence of aortocoronary bypass graft: Secondary | ICD-10-CM | POA: Diagnosis not present

## 2018-12-11 MED ORDER — WARFARIN SODIUM 2.5 MG PO TABS: ORAL_TABLET | ORAL | 1 refills | Status: DC

## 2018-12-11 NOTE — Telephone Encounter (Signed)
Coumadin refill sent on 10/20/2018 to [facility], this request is from [facility]. Will send in refill.

## 2018-12-11 NOTE — Addendum Note (Signed)
Addended by: Derrel Nip B on: 12/11/2018 09:33 AM   Modules accepted: Orders

## 2018-12-11 NOTE — Telephone Encounter (Signed)
°*  STAT* If patient is at the pharmacy, call can be transferred to refill team.   1. Which medications need to be refilled? (please list name of each medication and dose if known)  Coumadin  2. Which pharmacy/location (including street and city if local pharmacy) is medication to be sent to? [facility]- [place]  3. Do they need a 30 day or 90 day supply?60 days and refills

## 2018-12-12 NOTE — Progress Notes (Signed)
Cardiology Office Note:    Date:  12/14/2018   ID:  [name], DOB [birth year], MRN [id number]  PCP:  Denita Lung, MD  Cardiologist:  Lauree Chandler, MD  Electrophysiologist:  None   Referring MD: Denita Lung, MD   Chief Complaint: follow up of CAD and valvular disease s/p CABG, AVR, and MVR  History of Present Illness:    [name] is a 68 y.o. male with a history of CAD s/p CABG x2 with pericardial AVR, MVR, and PFO closure in 03/7251 complicated by post-op bradycardia and atrial flutter requiring PPM, ectatic thoracic aorta, hyperlipidemia, pulmonary nodules, and esophageal stricture who is followed by Dr. Angelena Form and presents today for follow-up.   Patient has been followed for aortic stenosis and mitral stenosis for years and was also known to have a PFO. He was found to have severe aortic stenosis and mitral stenosis by Echo in 06/2018. Right/left heart catheterization in 09/2018 showed chronic total occlusion of proximal RCA with left to right collaterals, 95% stenosis of mid LAD, 40% stenosis of proximal LAD, 30% stenosis of proximal to mid CX, 30% stenosis of OM1, and 30% stenosis of OM3. Pre-surgery duplex showed 1-39% stenosis of bilateral carotids. On 10/11/2018, he underwent CABG x2 with LIMA to LAD and SVG to RCA as well as bioprosthetic aortic valve replacement, bioprosthetic mitral valve replacement, closure of PFO, and clipping of left atrial appendage. Post-op course was notable for persistent bradycardia requiring Medtronic dual chamber pacemaker implantation as well as post-op atrial flutter. Patient was started on Coumadin. Dr. Rayann Heman recommended considering DCCV if patient remained out of rhythm at outpatient follow-up.   At wound check visit with EP on 10/31/2018, he was noted to still be in atrial flutter and INR was sub-therapeutic at 1.6. Given subtherapeutic INR, rhythm control was not pursued at that time. He was then seen by Melina Copa on 11/02/2018  for general Cardiology follow-up at which time he reported feeling OK overall, just fatigued. He was still in atrial flutter at that time. Plan was to consider DCCV once INR therapeutic for 3 weeks. At last visit in [facility] on 11/23/2018, he was felt to be in sinus rhythm and was feeling better at that time.  Patient presents today for follow-up. Patient doing well today. He reports some fatigue with activity but thinks this is slowly getting better since surgery. He goes to Cardiac Rehab 3 times per week and has been doing well with that. He denies any chest pain, shortness of breath, orthopnea, PND, or lower extremity edema. Heart rate is on the higher side. He states at Cardiac Rehab, rates are normally in the mid 90's to low 100's. However, he denies any lightheadedness, dizziness, or syncope. No abnormal bleeding on Coumadin.   Past Medical History:  Diagnosis Date  . BCE (basal cell epithelioma), arm    RIGHT SHOULDER  . Bradycardia   . CAD (coronary artery disease)    a. s/p CABGx2 10/11/18 LIMA to LAD, SVG to RCA, EVH via right thigh.  . Dyslipidemia   . Ectatic thoracic aorta ([place])   . Esophageal stricture   . Hyperlipidemia   . Incidental pulmonary nodule, > 22m and < 837m8/26/2020   Noted on CTA  . Inguinal hernia    bilateral  . Obesity   . S/P aortic valve replacement with bioprosthetic valve 10/11/2018   23 mm Edwards Inspiris Resilia stented bovine pericardial tissue valve  . S/P CABG x 2  10/11/2018   LIMA to LAD, SVG to RCA, EVH via right thigh  . S/P mitral valve replacement with bioprosthetic valve 10/11/2018   27 mm Medtronic Mosaic stented porcine bioprosthetic tissue valve  . S/P patent foramen ovale closure 10/11/2018  . S/P placement of cardiac pacemaker   . Smoker     Past Surgical History:  Procedure Laterality Date  . AORTIC VALVE REPLACEMENT N/A 10/11/2018   Procedure: AORTIC VALVE REPLACEMENT (AVR) with 23 Inspiris Bioprosthetic Aortic valve.;   Surgeon: Rexene Alberts, MD;  Location: [place];  Service: Open Heart Surgery;  Laterality: N/A;  . CLIPPING OF ATRIAL APPENDAGE N/A 10/11/2018   Procedure: CLIPPING OF LEFT ATRIAL APPENDAGE with 45 AtriCure Clip.;  Surgeon: Rexene Alberts, MD;  Location: [place];  Service: Open Heart Surgery;  Laterality: N/A;  . COLONOSCOPY  03/2008,2004   Dr. Amedeo Plenty  . CORONARY ARTERY BYPASS GRAFT N/A 10/11/2018   Procedure: CORONARY ARTERY BYPASS GRAFTING (CABG) x 2 using LIMA to the LAD and endoscopic greater saphenous vein harvesting to the RCA.;  Surgeon: Rexene Alberts, MD;  Location: [place];  Service: Open Heart Surgery;  Laterality: N/A;  . HERNIA REPAIR  12/11/10   BIH  . MITRAL VALVE REPLACEMENT N/A 10/11/2018   Procedure: MITRAL VALVE (MV) REPLACEMENT with 27 Mosaic Bioprosthetic Mitral valve.;  Surgeon: Rexene Alberts, MD;  Location: [place];  Service: Open Heart Surgery;  Laterality: N/A;  . PACEMAKER IMPLANT N/A 10/17/2018   Procedure: PACEMAKER IMPLANT;  Surgeon: Thompson Grayer, MD;  Location: [facility];  Service: Cardiovascular;  Laterality: N/A;  . REPAIR OF PATENT FORAMEN OVALE N/A 10/11/2018   Procedure: CLOSURE OF PATENT FORAMEN OVALE;  Surgeon: Rexene Alberts, MD;  Location: [place];  Service: Open Heart Surgery;  Laterality: N/A;  . RIGHT/LEFT HEART CATH AND CORONARY ANGIOGRAPHY N/A 09/15/2018   Procedure: RIGHT/LEFT HEART CATH AND CORONARY ANGIOGRAPHY;  Surgeon: Burnell Blanks, MD;  Location: [facility];  Service: Cardiovascular;  Laterality: N/A;  . TEE WITHOUT CARDIOVERSION N/A 12/12/2015   Procedure: TRANSESOPHAGEAL ECHOCARDIOGRAM (TEE);  Surgeon: Sanda Klein, MD;  Location: [place];  Service: Cardiovascular;  Laterality: N/A;  . TEE WITHOUT CARDIOVERSION N/A 10/11/2018   Procedure: TRANSESOPHAGEAL ECHOCARDIOGRAM (TEE);  Surgeon: Rexene Alberts, MD;  Location: [place];  Service: Open Heart Surgery;  Laterality: N/A;  . THYMECTOMY  1976    Current Medications: No  outpatient medications have been marked as taking for the 12/14/18 encounter (Office Visit) with Darreld Mclean, PA-C.     Allergies:   Tylenol [acetaminophen]   Social History   Socioeconomic History  . Marital status: Married    Spouse name: Not on file  . Number of children: 0  . Years of education: Not on file  . Highest education level: Not on file  Occupational History  . Occupation: Programme researcher, broadcasting/film/video: [employer]  Social Needs  . Financial resource strain: Not on file  . Food insecurity    Worry: Not on file    Inability: Not on file  . Transportation needs    Medical: Not on file    Non-medical: Not on file  Tobacco Use  . Smoking status: Former Smoker    Packs/day: 0.25    Years: 35.00    Pack years: 8.75    Types: Cigarettes    Quit date: 09/04/2018    Years since quitting: 0.2  . Smokeless tobacco: Never Used  Substance and Sexual  Activity  . Alcohol use: Yes    Alcohol/week: 1.0 standard drinks    Types: 1 Cans of beer per week  . Drug use: No  . Sexual activity: Yes  Lifestyle  . Physical activity    Days per week: Not on file    Minutes per session: Not on file  . Stress: Not on file  Relationships  . Social Herbalist on phone: Not on file    Gets together: Not on file    Attends religious service: Not on file    Active member of club or organization: Not on file    Attends meetings of clubs or organizations: Not on file    Relationship status: Not on file  Other Topics Concern  . Not on file  Social History Narrative  . Not on file     Family History: The patient's family history includes Cancer (age of onset: 36) in his father; Cancer (age of onset: 49) in his mother. There is no history of CAD.  ROS:   Please see the history of present illness.    All other systems reviewed and are negative.  EKGs/Labs/Other Studies Reviewed:    The following studies were reviewed today:  Echocardiogram 06/27/2018: Impressions:  1. The left ventricle has hyperdynamic systolic function, with an ejection fraction of >65%. The cavity size was normal. There is moderately increased left ventricular wall thickness. Left ventricular diastolic Doppler parameters are indeterminate.  2. The right ventricle has normal systolic function. The cavity was normal.  3. Left atrial size was severely dilated.  4. The mitral valve is abnormal. There is severe mitral annular calcification present. Severe mitral valve stenosis.  5. The tricuspid valve is grossly normal.  6. There is mild dilatation of the ascending aorta measuring 38 mm.  7. Vigorous LV systolic function; moderate LVH; mildly dilated ascending aorta; heavily calcified aortic valve with severe AS (mean gradient 45 mmHg) and at least moderate AI (difficult to quantitate due to contamination from mitral inflow); severe MAC  with severe MS (mean gradient 13 mmHg; MVA by pressure halftime 1.1 cm2); severe LAE. _______________  Right/Left Heart Catheterization 09/15/2018:  Prox RCA lesion is 100% stenosed.  Prox Cx to Mid Cx lesion is 30% stenosed.  1st Mrg lesion is 30% stenosed.  3rd Mrg lesion is 30% stenosed.  Prox LAD lesion is 40% stenosed.  Mid LAD lesion is 95% stenosed.   1. Severe stenosis mid LAD 2. Mild non-obstructive disease in the LAD 3. Chronic occlusion of the proximal RCA. Filling of the proximal, mid and distal RCA from left to right collaterals 4. Severe aortic stenosis (mean gradient 34.1 mmHg, peak to peak gradient 32 mmHg, AVA 1.1 cm2) 5. Severe mitral stenosis by echo.   Recommendations: Will refer to CT surgery for CABG, AVR and MVR. Will discuss arranging gated cardiac CT prior to surgery. _______________  Pre-CABG Dopplers 10/09/2018: Summary: - Right Carotid: Velocities in the right ICA are consistent with a 1-39% stenosis. - Left Carotid: Velocities in the left ICA are consistent with a 1-39% stenosis. - Vertebrals: Bilateral vertebral  arteries demonstrate antegrade flow.  - Right ABI: Resting right ankle-brachial index is within normal range. No evidence of significant right lower extremity arterial disease. - Left ABI: Resting left ankle-brachial index indicates noncompressible left lower extremity arteries. _______________  Pacemaker Implant 10/17/2018: Conclusions: 1. Successful implantation of a Medtronic Azure XT MRI conditional dual-chamber pacemaker for symptomatic mobitz II second degree AV block  2. No early apparent complications.   EKG:  EKG not ordered today.  Recent Labs: 10/13/2018: Magnesium 2.5 11/02/2018: ALT 26; BUN 15; Creatinine, Ser 0.88; Hemoglobin 11.0; Platelets 383; Potassium 4.5; Sodium 137; TSH 1.730  Recent Lipid Panel    Component Value Date/Time   CHOL 237 (H) 04/28/2018 1020   TRIG 96 04/28/2018 1020   HDL 63 04/28/2018 1020   CHOLHDL 3.8 04/28/2018 1020   CHOLHDL 4.2 03/25/2015 0001   VLDL 30 03/25/2015 0001   LDLCALC 155 (H) 04/28/2018 1020    Physical Exam:    Vital Signs: BP 132/80   Pulse 94   Ht _0  (1.753 m)   Wt 226 lb 6.4 oz (102.7 kg)   SpO2 98%   BMI 33.43 kg/m     Wt Readings from Last 3 Encounters:  12/14/18 226 lb 6.4 oz (102.7 kg)  11/23/18 220 lb (99.8 kg)  11/13/18 216 lb (98 kg)     General: 68 y.o. Caucasian male resting comfortably in no acute distress. HEENT: Normocephalic and atraumatic. Sclera clear. EOMs intact. Neck: Supple. No carotid bruits.  Heart: Borderline tachycardic with regular rhythm. Distinct S1 and S2. Soft murmur heard best at right upper sternal border. Radial pulses 2+ and equal bilaterally. Incisions healing well. Lungs: No increased work of breathing. Clear to ausculation bilaterally. No wheezes, rhonchi, or rales.  Abdomen: Soft, non-distended, and non-tender to palpation. Bowel sounds present. MSK: Normal strength and tone for age. Extremities: No lower extremity edema.    Skin: Warm and dry. Neuro: Alert and oriented x3.  No focal deficits. Psych: Normal affect. Responds appropriately.   Assessment:    1. Coronary artery disease involving native coronary artery of native heart without angina pectoris   2. S/P CABG (coronary artery bypass graft)   3. Severe aortic stenosis    4. S/P AVR   5. Severe mitral valve stenosis   6. S/P MVR (mitral valve replacement)   7. s/p PFO closure   8. Atrial flutter, unspecified type ([place])   9. Bradycardia s/p PPM   10. Hyperlipidemia, unspecified hyperlipidemia type   11. Bilateral carotid artery stenosis   12. Ectatic thoracic aorta ([place])   13. Pulmonary nodule     Plan:    CAD s/p Recent CABG with AVR, MVR, and PFO Closure - Stable. Recovering well. Note being somewhat easily fatigued with activity but thinks this is slowly improving. - Continue aspirin, beta-blocker, and statin. - Will discuss with Dr. Angelena Form about timing of post-op Echo. - Follow-up with Dr.Owen scheduled for 01/08/2019.  Atrial Flutter - Back in normal sinus rhythm at last visit in [facility] on 11/23/2018.  - Regular rhythm. Heart rates in the mid 90's.  - Will increase Lopressor from 78m to 560mtwice daily. Could consider transitioning to Toprol-XL at next visit.  - CHA2DS2-VASc = 2. Continue chronic anticoagulation with Coumadin. Follows in our [facility]in Clinic.   Bradycardia s/p PPM - Followed by Dr. AlRayann HemanNext appointment scheduled for 01/18/2019.  Hyperlipidemia - Most recent lipid panel from 04/2018: Total Cholesterol 237, Triglycerides 96, HDL 63, LDL 155. - LDL goal <70 given CAD. - Currently on Lipitor 2066maily.  - Repeat lipid panel and CMET planned for today. Will likely need to increase dose of Lipitor.   Mild Bilateral Carotid Stenosis - Pre-CABG dopplers showed 1-39% stenosis of bilateral internal carotid arteries. - Continue aspirin and statin.  Ectatic Thoracic Aorta - Chest CTA in 09/2018 showed ectasia of the ascending  thoracic aorta (4.0cm  in diameter). Annual imaging with CTA or MRA recommended at that time.   Pulmonary Nodules - Chest CTA in 09/2018 showed 2 pulmonary nodules. Repeat non-contrast in 3-6 months recommended at that time. Will defer this to PCP.  Disposition: Follow up in 3 month with Dr. Angelena Form.   Medication Adjustments/Labs and Tests Ordered: Current medicines are reviewed at length with the patient today.  Concerns regarding medicines are outlined above.  No orders of the defined types were placed in this encounter.  Meds ordered this encounter  Medications  . metoprolol tartrate (LOPRESSOR) 50 MG tablet    Sig: Take 1 tablet (50 mg total) by mouth 2 (two) times daily.    Dispense:  60 tablet    Refill:  0    Patient Instructions  Medication Instructions:  Your physician has recommended you make the following change in your medication:  1.  INCREASE the Lopressor to 50 mg taking 1 tablet twice a day.  You may take 2 of the 25 mg tablets twice a day to use them up.  I have sent in a new prescription to your pharmacy for the 50 mg tablets.   *If you need a refill on your cardiac medications before your next appointment, please call your pharmacy*  Lab Work: None ordered today, but needs labs previously ordered  If you have labs (blood work) drawn today and your tests are completely normal, you will receive your results only by: Marland Kitchen MyChart Message (if you have MyChart) OR . A paper copy in the mail If you have any lab test that is abnormal or we need to change your treatment, we will call you to review the results.  Testing/Procedures: None ordered  Follow-Up: At [facility], you and your health needs are our priority.  As part of our continuing mission to provide you with exceptional heart care, we have created designated Provider Care Teams.  These Care Teams include your primary Cardiologist (physician) and Advanced Practice Providers (APPs -  Physician Assistants and Nurse Practitioners)  who all work together to provide you with the care you need, when you need it.  Your next appointment:   3 months  04/06/2019 ARRIVE AT 10:05 FOR REGISTRATION  The format for your next appointment:   In Person  Provider:   Lauree Chandler, MD  Other Instructions     Signed, Darreld Mclean, PA-C  12/14/2018 10:11 AM    [place]

## 2018-12-13 DIAGNOSIS — Z954 Presence of other heart-valve replacement: Secondary | ICD-10-CM | POA: Diagnosis not present

## 2018-12-13 DIAGNOSIS — Z951 Presence of aortocoronary bypass graft: Secondary | ICD-10-CM | POA: Diagnosis not present

## 2018-12-14 ENCOUNTER — Encounter: Payer: Self-pay | Admitting: Physician Assistant

## 2018-12-14 ENCOUNTER — Other Ambulatory Visit: Payer: Medicare HMO | Admitting: *Deleted

## 2018-12-14 ENCOUNTER — Ambulatory Visit (INDEPENDENT_AMBULATORY_CARE_PROVIDER_SITE_OTHER): Payer: Medicare HMO | Admitting: *Deleted

## 2018-12-14 ENCOUNTER — Ambulatory Visit: Payer: Medicare HMO | Admitting: Student

## 2018-12-14 ENCOUNTER — Other Ambulatory Visit: Payer: Self-pay

## 2018-12-14 VITALS — BP 132/80 | HR 94 | Ht 69.0 in | Wt 226.4 lb

## 2018-12-14 DIAGNOSIS — Z953 Presence of xenogenic heart valve: Secondary | ICD-10-CM

## 2018-12-14 DIAGNOSIS — Z952 Presence of prosthetic heart valve: Secondary | ICD-10-CM | POA: Diagnosis not present

## 2018-12-14 DIAGNOSIS — I2510 Atherosclerotic heart disease of native coronary artery without angina pectoris: Secondary | ICD-10-CM

## 2018-12-14 DIAGNOSIS — Z5181 Encounter for therapeutic drug level monitoring: Secondary | ICD-10-CM

## 2018-12-14 DIAGNOSIS — Q2112 Patent foramen ovale: Secondary | ICD-10-CM

## 2018-12-14 DIAGNOSIS — I350 Nonrheumatic aortic (valve) stenosis: Secondary | ICD-10-CM | POA: Diagnosis not present

## 2018-12-14 DIAGNOSIS — Z8774 Personal history of (corrected) congenital malformations of heart and circulatory system: Secondary | ICD-10-CM

## 2018-12-14 DIAGNOSIS — I050 Rheumatic mitral stenosis: Secondary | ICD-10-CM

## 2018-12-14 DIAGNOSIS — I77810 Thoracic aortic ectasia: Secondary | ICD-10-CM

## 2018-12-14 DIAGNOSIS — Q211 Atrial septal defect: Secondary | ICD-10-CM | POA: Diagnosis not present

## 2018-12-14 DIAGNOSIS — I4892 Unspecified atrial flutter: Secondary | ICD-10-CM | POA: Diagnosis not present

## 2018-12-14 DIAGNOSIS — Z950 Presence of cardiac pacemaker: Secondary | ICD-10-CM

## 2018-12-14 DIAGNOSIS — E785 Hyperlipidemia, unspecified: Secondary | ICD-10-CM | POA: Diagnosis not present

## 2018-12-14 DIAGNOSIS — R001 Bradycardia, unspecified: Secondary | ICD-10-CM

## 2018-12-14 DIAGNOSIS — Z951 Presence of aortocoronary bypass graft: Secondary | ICD-10-CM

## 2018-12-14 DIAGNOSIS — R911 Solitary pulmonary nodule: Secondary | ICD-10-CM

## 2018-12-14 DIAGNOSIS — I6523 Occlusion and stenosis of bilateral carotid arteries: Secondary | ICD-10-CM

## 2018-12-14 LAB — POCT INR: INR: 2.1 (ref 2.0–3.0)

## 2018-12-14 MED ORDER — METOPROLOL TARTRATE 50 MG PO TABS: 50.0000 mg | ORAL_TABLET | Freq: Two times a day (BID) | ORAL | 0 refills | Status: DC

## 2018-12-14 NOTE — Patient Instructions (Signed)
Description   Continue same dosage 2 tablets daily, except for 3 tablets on Tuesdays, Thursdays and Saturdays.  Recheck INR in 2 weeks . [facility] [phone] Main [phone].

## 2018-12-14 NOTE — Patient Instructions (Addendum)
Medication Instructions:  Your physician has recommended you make the following change in your medication:  1.  INCREASE the Lopressor to 50 mg taking 1 tablet twice a day.  You may take 2 of the 25 mg tablets twice a day to use them up.  I have sent in a new prescription to your pharmacy for the 50 mg tablets.   *If you need a refill on your cardiac medications before your next appointment, please call your pharmacy*  Lab Work: None ordered today, but needs labs previously ordered  If you have labs (blood work) drawn today and your tests are completely normal, you will receive your results only by: Marland Kitchen MyChart Message (if you have MyChart) OR . A paper copy in the mail If you have any lab test that is abnormal or we need to change your treatment, we will call you to review the results.  Testing/Procedures: None ordered  Follow-Up: At [facility], you and your health needs are our priority.  As part of our continuing mission to provide you with exceptional heart care, we have created designated Provider Care Teams.  These Care Teams include your primary Cardiologist (physician) and Advanced Practice Providers (APPs -  Physician Assistants and Nurse Practitioners) who all work together to provide you with the care you need, when you need it.  Your next appointment:   3 months  04/06/2019 ARRIVE AT 10:05 FOR REGISTRATION  The format for your next appointment:   In Person  Provider:   Lauree Chandler, MD  Other Instructions

## 2018-12-15 ENCOUNTER — Telehealth: Payer: Self-pay

## 2018-12-15 DIAGNOSIS — Z951 Presence of aortocoronary bypass graft: Secondary | ICD-10-CM | POA: Diagnosis not present

## 2018-12-15 DIAGNOSIS — Z954 Presence of other heart-valve replacement: Secondary | ICD-10-CM | POA: Diagnosis not present

## 2018-12-15 LAB — COMPREHENSIVE METABOLIC PANEL
ALT: 15 IU/L (ref 0–44)
AST: 19 IU/L (ref 0–40)
Albumin/Globulin Ratio: 1.4 (ref 1.2–2.2)
Albumin: 3.9 g/dL (ref 3.8–4.8)
Alkaline Phosphatase: 79 IU/L (ref 39–117)
BUN/Creatinine Ratio: 14 (ref 10–24)
BUN: 15 mg/dL (ref 8–27)
Bilirubin Total: 0.6 mg/dL (ref 0.0–1.2)
CO2: 22 mmol/L (ref 20–29)
Calcium: 9.1 mg/dL (ref 8.6–10.2)
Chloride: 102 mmol/L (ref 96–106)
Creatinine, Ser: 1.04 mg/dL (ref 0.76–1.27)
GFR calc Af Amer: 85 mL/min/{1.73_m2} (ref >59)
GFR calc non Af Amer: 73 mL/min/{1.73_m2} (ref >59)
Globulin, Total: 2.8 g/dL (ref 1.5–4.5)
Glucose: 100 mg/dL — ABNORMAL HIGH (ref 65–99)
Potassium: 4.2 mmol/L (ref 3.5–5.2)
Sodium: 139 mmol/L (ref 134–144)
Total Protein: 6.7 g/dL (ref 6.0–8.5)

## 2018-12-15 LAB — LIPID PANEL
Chol/HDL Ratio: 2.1 ratio (ref 0.0–5.0)
Cholesterol, Total: 98 mg/dL — ABNORMAL LOW (ref 100–199)
HDL: 46 mg/dL (ref >39)
LDL Chol Calc (NIH): 36 mg/dL (ref 0–99)
Triglycerides: 81 mg/dL (ref 0–149)
VLDL Cholesterol Cal: 16 mg/dL (ref 5–40)

## 2018-12-15 MED ORDER — ATORVASTATIN CALCIUM 20 MG PO TABS: 20.0000 mg | ORAL_TABLET | Freq: Every day | ORAL | 3 refills | Status: DC

## 2018-12-15 NOTE — Telephone Encounter (Signed)
Left detailed message.  Advised to call  Office or send mychart message if he would like to increase atorvastatin and set up repeat lab work.  Advised if Pt did not want to make a change that he did not need to do anything.  Advised to either call office or send mychart message if he was interested in increasing atorvastatin.

## 2018-12-15 NOTE — Telephone Encounter (Signed)
Call back received from Pt.  Per Pt he would not like to make any medication changes at this time.  Advised that was fine.  Pt requested refill of atorvastatin as he only  Has a few days left.  Refilled medication as requested.

## 2018-12-15 NOTE — Addendum Note (Signed)
Addended by: Willeen Cass A on: 12/15/2018 02:26 PM   Modules accepted: Orders

## 2018-12-15 NOTE — Telephone Encounter (Signed)
-----   Message from Charlie Pitter, [place] sent at 12/15/2018 11:01 AM EST ----- Sending to triage because myself and Anderson Malta are both off today. Please let patient know labs are normal except glucose just a point from normal. His liver function has totally normalized. His cholesterol numbers look great. Given his coronary disease, he technically should be on at atorvastatin 40 to 80 mg daily. However, when I saw him post bypass, he had recently had some abnormal liver function in the hospital so we left him at 20 mg at that time. Usually this recommendation is independent of the cholesterol numbers. If he is willing, would increase atorvastatin to 40 mg daily with recheck liver/lipid profile fasting in about 6 weeks. If he does not wish to enact this change, would continue present regimen and f/u as previously advised. [place]

## 2018-12-18 DIAGNOSIS — Z951 Presence of aortocoronary bypass graft: Secondary | ICD-10-CM | POA: Diagnosis not present

## 2018-12-18 DIAGNOSIS — Z954 Presence of other heart-valve replacement: Secondary | ICD-10-CM | POA: Diagnosis not present

## 2018-12-20 DIAGNOSIS — Z954 Presence of other heart-valve replacement: Secondary | ICD-10-CM | POA: Diagnosis not present

## 2018-12-20 DIAGNOSIS — Z951 Presence of aortocoronary bypass graft: Secondary | ICD-10-CM | POA: Diagnosis not present

## 2018-12-22 DIAGNOSIS — Z954 Presence of other heart-valve replacement: Secondary | ICD-10-CM | POA: Diagnosis not present

## 2018-12-22 DIAGNOSIS — Z951 Presence of aortocoronary bypass graft: Secondary | ICD-10-CM | POA: Diagnosis not present

## 2018-12-25 DIAGNOSIS — Z954 Presence of other heart-valve replacement: Secondary | ICD-10-CM | POA: Diagnosis not present

## 2018-12-25 DIAGNOSIS — Z951 Presence of aortocoronary bypass graft: Secondary | ICD-10-CM | POA: Diagnosis not present

## 2018-12-27 DIAGNOSIS — Z951 Presence of aortocoronary bypass graft: Secondary | ICD-10-CM | POA: Diagnosis not present

## 2018-12-27 DIAGNOSIS — Z954 Presence of other heart-valve replacement: Secondary | ICD-10-CM | POA: Diagnosis not present

## 2018-12-28 ENCOUNTER — Other Ambulatory Visit: Payer: Self-pay

## 2018-12-28 ENCOUNTER — Ambulatory Visit (INDEPENDENT_AMBULATORY_CARE_PROVIDER_SITE_OTHER): Payer: Medicare HMO | Admitting: *Deleted

## 2018-12-28 DIAGNOSIS — Z8774 Personal history of (corrected) congenital malformations of heart and circulatory system: Secondary | ICD-10-CM

## 2018-12-28 DIAGNOSIS — Z5181 Encounter for therapeutic drug level monitoring: Secondary | ICD-10-CM

## 2018-12-28 DIAGNOSIS — Z953 Presence of xenogenic heart valve: Secondary | ICD-10-CM

## 2018-12-28 DIAGNOSIS — Z951 Presence of aortocoronary bypass graft: Secondary | ICD-10-CM

## 2018-12-28 LAB — POCT INR: INR: 1.5 — AB (ref 2.0–3.0)

## 2018-12-28 NOTE — Patient Instructions (Addendum)
Description   Today take 3.5 tablets and tomorrow take 2.5 tablets then continue same dosage 2 tablets daily, except for 3 tablets on Tuesdays, Thursdays and Saturdays. Recheck INR in 10 days. [facility] [phone] Main [phone].

## 2018-12-29 DIAGNOSIS — Z951 Presence of aortocoronary bypass graft: Secondary | ICD-10-CM | POA: Diagnosis not present

## 2018-12-29 DIAGNOSIS — Z954 Presence of other heart-valve replacement: Secondary | ICD-10-CM | POA: Diagnosis not present

## 2019-01-01 DIAGNOSIS — Z954 Presence of other heart-valve replacement: Secondary | ICD-10-CM | POA: Diagnosis not present

## 2019-01-01 DIAGNOSIS — Z951 Presence of aortocoronary bypass graft: Secondary | ICD-10-CM | POA: Diagnosis not present

## 2019-01-02 DIAGNOSIS — Z954 Presence of other heart-valve replacement: Secondary | ICD-10-CM | POA: Diagnosis not present

## 2019-01-02 DIAGNOSIS — Z951 Presence of aortocoronary bypass graft: Secondary | ICD-10-CM | POA: Diagnosis not present

## 2019-01-03 DIAGNOSIS — Z954 Presence of other heart-valve replacement: Secondary | ICD-10-CM | POA: Diagnosis not present

## 2019-01-03 DIAGNOSIS — Z951 Presence of aortocoronary bypass graft: Secondary | ICD-10-CM | POA: Diagnosis not present

## 2019-01-08 ENCOUNTER — Ambulatory Visit (INDEPENDENT_AMBULATORY_CARE_PROVIDER_SITE_OTHER): Payer: Self-pay | Admitting: Surgical

## 2019-01-08 ENCOUNTER — Other Ambulatory Visit: Payer: Self-pay

## 2019-01-08 ENCOUNTER — Ambulatory Visit (INDEPENDENT_AMBULATORY_CARE_PROVIDER_SITE_OTHER): Payer: Medicare HMO | Admitting: *Deleted

## 2019-01-08 VITALS — BP 129/88 | HR 107 | Temp 96.4°F | Resp 16 | Ht 69.0 in | Wt 227.1 lb

## 2019-01-08 DIAGNOSIS — Z953 Presence of xenogenic heart valve: Secondary | ICD-10-CM

## 2019-01-08 DIAGNOSIS — Z9889 Other specified postprocedural states: Secondary | ICD-10-CM

## 2019-01-08 DIAGNOSIS — Z8774 Personal history of (corrected) congenital malformations of heart and circulatory system: Secondary | ICD-10-CM | POA: Diagnosis not present

## 2019-01-08 DIAGNOSIS — Z951 Presence of aortocoronary bypass graft: Secondary | ICD-10-CM | POA: Diagnosis not present

## 2019-01-08 DIAGNOSIS — Z5181 Encounter for therapeutic drug level monitoring: Secondary | ICD-10-CM

## 2019-01-08 DIAGNOSIS — Z954 Presence of other heart-valve replacement: Secondary | ICD-10-CM | POA: Diagnosis not present

## 2019-01-08 LAB — POCT INR: INR: 2.4 (ref 2.0–3.0)

## 2019-01-08 NOTE — Patient Instructions (Signed)
f/u 1 year

## 2019-01-08 NOTE — Assessment & Plan Note (Signed)
Steady recovery

## 2019-01-08 NOTE — Patient Instructions (Signed)
Description   Continue same dosage 2 tablets daily, except for 3 tablets on Tuesdays, Thursdays and Saturdays. Recheck INR in 2 weeks. Coumadin Clinic [phone] Main [phone].

## 2019-01-08 NOTE — Progress Notes (Signed)
[address]       [place] [address]             [id number]      Stephano A Gethers [facility] Medical Record [id number] Date of Birth: [birth year]  Referring: Burnell Blanks* Primary Care: Denita Lung, MD Primary Cardiologist: Lauree Chandler, MD   Chief Complaint:   POST OP FOLLOW UP CARDIOTHORACIC SURGERY OPERATIVE NOTE  Date of Procedure:                10/11/2018  Preoperative Diagnosis:       ? History of Radiation Therapy to Mediastinum ? Severe Aortic Stenosis with Moderate Aortic Insufficiency ? Moderate Mitral Stenosis with Moderate-Severe Mitral Regurgitation ? Severe Multi-vessel Coronary Artery Disease ? Patent Foramen Ovale  Postoperative Diagnosis:    Same  Procedure:        Aortic Valve Replacement             Edwards Inspiris Resilia Stented Bovine Pericardial Tissue Valve (size 87mm, ref # [id number], serial # [id number])               Mitral Valve Replacement             Medtronic Mosaic Stented Porcine Tissue Valve (size 8mm, model # 310, serial # [id number])   Coronary Artery Bypass Grafting x 2             Left Internal Mammary Artery to Distal Left Anterior Descending Coronary Artery             Saphenous Vein Graft to Distal Right Coronary Artery             Endoscopic Vein Harvest from Right Thigh    Closure of Patent Foramen Ovale               Clipping of Left Atrial Appendage             Atricure [id number] left atrial clip (size 2mm)   Placement of Femoral Arterial Line   Surgeon:        Valentina Gu. Roxy Manns, MD  Assistant:       John Giovanni, PA-C  History of Present Illness:    The patient is a 68 year old male status post the above described procedure seen in the office today's date for clearance to return back to work.  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  He describes the work as only mildly physical having to carry trash at times.  He reports to school system  is very amenable to working with him as he transitions back to work.  If he feels that the work is too tiring they are willing to back off on workload.  He denies shortness of breath with activity, but does tire relatively easily.  He does feel as though he is doing pretty well in cardiac rehab but is significantly physically deconditioned and was so  preoperatively.      Past Medical History:  Diagnosis Date  . BCE (basal cell epithelioma), arm    RIGHT SHOULDER  . Bradycardia   . CAD (coronary artery disease)    a. s/p CABGx2 10/11/18 LIMA to LAD, SVG to RCA, EVH via right thigh.  . Dyslipidemia   . Ectatic thoracic aorta ([place])   . Esophageal stricture   . Hyperlipidemia   . Incidental pulmonary nodule, > 90mm and < 72mm 10/04/2018   Noted on CTA  .  Inguinal hernia    bilateral  . Obesity   . S/P aortic valve replacement with bioprosthetic valve 10/11/2018   23 mm Edwards Inspiris Resilia stented bovine pericardial tissue valve  . S/P CABG x 2 10/11/2018   LIMA to LAD, SVG to RCA, EVH via right thigh  . S/P mitral valve replacement with bioprosthetic valve 10/11/2018   27 mm Medtronic Mosaic stented porcine bioprosthetic tissue valve  . S/P patent foramen ovale closure 10/11/2018  . S/P placement of cardiac pacemaker   . Smoker      Social History   Tobacco Use  Smoking Status Former Smoker  . Packs/day: 0.25  . Years: 35.00  . Pack years: 8.75  . Types: Cigarettes  . Quit date: 09/04/2018  . Years since quitting: 0.3  Smokeless Tobacco Never Used    Social History   Substance and Sexual Activity  Alcohol Use Yes  . Alcohol/week: 1.0 standard drinks  . Types: 1 Cans of beer per week     Allergies  Allergen Reactions  . Tylenol [Acetaminophen] Nausea Only    Current Outpatient Medications  Medication Sig Dispense Refill  . atorvastatin (LIPITOR) 20 MG tablet Take 1 tablet (20 mg total) by mouth daily at 6 PM. 90 tablet 3  . calcium carbonate (TUMS - DOSED IN MG  ELEMENTAL CALCIUM) 500 MG chewable tablet Chew 1 tablet by mouth as needed for indigestion or heartburn.    . metoprolol tartrate (LOPRESSOR) 50 MG tablet Take 1 tablet (50 mg total) by mouth 2 (two) times daily. 60 tablet 0  . oxymetazoline (AFRIN) 0.05 % nasal spray Place 1 spray into both nostrils at bedtime as needed for congestion.    Marland Kitchen warfarin (COUMADIN) 2.5 MG tablet Take 2 tablets daily except 3 tablets on Tuesday, Thursday, Saturday or as directed by the Coumadin Clinic. 80 tablet 1  . aspirin 81 MG tablet Take 81 mg by mouth every other day.      No current facility-administered medications for this visit.        Physical Exam: BP 129/88 (BP Location: Right Arm, Patient Position: Sitting, Cuff Size: Normal)   Pulse (!) 107   Temp (!) 96.4 F (35.8 C)   Resp 16   Ht 5\' 9"  (1.753 m)   Wt 227 lb 2.2 oz (103 kg)   SpO2 94% Comment: RA  BMI 33.54 kg/m   General appearance: alert, cooperative and no distress Heart: regular rate and rhythm and Soft systolic murmur Lungs: clear to auscultation bilaterally Abdomen: Benign exam Extremities: No clubbing cyanosis or edema Wound: Incisions well-healed without evidence of infection   Diagnostic Studies & Laboratory data:     Recent Radiology Findings:   No results found.    Recent Lab Findings: Lab Results  Component Value Date   WBC 8.3 11/02/2018   HGB 11.0 (L) 11/02/2018   HCT 34.0 (L) 11/02/2018   PLT 383 11/02/2018   GLUCOSE 100 (H) 12/14/2018   CHOL 98 (L) 12/14/2018   TRIG 81 12/14/2018   HDL 46 12/14/2018   LDLCALC 36 12/14/2018   ALT 15 12/14/2018   AST 19 12/14/2018   NA 139 12/14/2018   K 4.2 12/14/2018   CL 102 12/14/2018   CREATININE 1.04 12/14/2018   BUN 15 12/14/2018   CO2 22 12/14/2018   TSH 1.730 11/02/2018   INR 2.4 01/08/2019   HGBA1C 5.8 (H) 10/09/2018      Assessment / Plan: The patient continues to do well postoperatively and  is overall making steady improvement in his physical  deconditioning and surgical recovery.  I feel he is safe to return to work with usual duties knowing that his employer is very willing to be amenable to change should he have difficulty completing tasks due to being tired or deconditioning.  We will see him again in a 1 year from surgery appointment.      Medication Changes: No orders of the defined types were placed in this encounter.   John Giovanni, PA-C 01/08/2019 2:01 PM

## 2019-01-10 ENCOUNTER — Other Ambulatory Visit: Payer: Self-pay | Admitting: Student

## 2019-01-10 DIAGNOSIS — Z954 Presence of other heart-valve replacement: Secondary | ICD-10-CM | POA: Diagnosis not present

## 2019-01-10 DIAGNOSIS — Z951 Presence of aortocoronary bypass graft: Secondary | ICD-10-CM | POA: Diagnosis not present

## 2019-01-11 ENCOUNTER — Telehealth: Payer: Self-pay | Admitting: Cardiovascular Disease

## 2019-01-11 MED ORDER — METOPROLOL TARTRATE 25 MG PO TABS: 25.0000 mg | ORAL_TABLET | Freq: Two times a day (BID) | ORAL | 3 refills | Status: DC

## 2019-01-11 NOTE — Telephone Encounter (Signed)
Pt c/o medication issue:  1. Name of Medication: metoprolol tartrate (LOPRESSOR) 50 MG tablet  2. How are you currently taking this medication (dosage and times per day)? 50 MG twice daily  3. Are you having a reaction (difficulty breathing--STAT)? no  4. What is your medication issue? Patient states that he feels more tired. Recently increased to 50 mg twice daily but feels he did better on 25 mg twice daily. States he has to rest about 3 times during his walk on the treadmill at rehab whereas before he did not have to rest.

## 2019-01-11 NOTE — Telephone Encounter (Signed)
We can cut back to 25 mg per day on the metoprolol and see if this helps his fatigue. [name]

## 2019-01-11 NOTE — Telephone Encounter (Signed)
I spoke with pt and gave him information from Dr. McAlhany 

## 2019-01-11 NOTE — Telephone Encounter (Signed)
Pt called to report that his metoprolol was increased to 50 mg bid on 12/14/18 at his visit since his HR was staying in the 90's and sometime in the 100's at Cardiac Rehab and at home.   Pt says he has been very tired on the increased dose and gets very washed out early in the evening.  He says he is willing to stay on it but wanted to check and see if any changes can be made. He is unaware of his BP and says he thinks his HR is still in the 90's when at cardiac rehab.   Pt says when he is at rehab on the treadmill he gets more tired than he did when on the 25 mg and has to stop and rest more frequently.   Pt had appt at Cardiothoracic surgeon office 01/08/19 at his HR there was 107.   Pt had Aortic and Mitral Valve replacement 10/11/18. Pacer implant 10/17/18.  Pt is following up with Dr. Rayann Heman 01/18/19 for pacer check... Will forward to Dr. Angelena Form for review and to the device clinic to see if we can get a remote transmission to see trends in his HR.

## 2019-01-11 NOTE — Telephone Encounter (Signed)
He was on Lopressor 50 mg twice daily. Did you want to decrease lopressor to 25mg  twice daily or once daily?

## 2019-01-11 NOTE — Telephone Encounter (Signed)
Transmission received. Normal device function. No AF. 1 short NSVT episode. Heart rate histograms consistent with last transmission (slightly right shifted)  Chanetta Marshall, NP 01/11/2019 4:41 PM

## 2019-01-11 NOTE — Telephone Encounter (Signed)
Thanks Amber. 

## 2019-01-11 NOTE — Telephone Encounter (Signed)
I spoke with the pt to ask him to send a manual transmission with his home monitor. The pt agreed to send one. I told him as soon as we see it, I will have the nurse review it and give him a call back. The pt verbalized understanding.

## 2019-01-11 NOTE — Telephone Encounter (Signed)
[name]. Let's reduce to 25 BID

## 2019-01-12 DIAGNOSIS — Z954 Presence of other heart-valve replacement: Secondary | ICD-10-CM | POA: Diagnosis not present

## 2019-01-12 DIAGNOSIS — Z951 Presence of aortocoronary bypass graft: Secondary | ICD-10-CM | POA: Diagnosis not present

## 2019-01-15 DIAGNOSIS — Z951 Presence of aortocoronary bypass graft: Secondary | ICD-10-CM | POA: Diagnosis not present

## 2019-01-15 DIAGNOSIS — Z954 Presence of other heart-valve replacement: Secondary | ICD-10-CM | POA: Diagnosis not present

## 2019-01-17 DIAGNOSIS — Z954 Presence of other heart-valve replacement: Secondary | ICD-10-CM | POA: Diagnosis not present

## 2019-01-17 DIAGNOSIS — Z951 Presence of aortocoronary bypass graft: Secondary | ICD-10-CM | POA: Diagnosis not present

## 2019-01-18 ENCOUNTER — Encounter: Payer: Self-pay | Admitting: Internal Medicine

## 2019-01-18 ENCOUNTER — Ambulatory Visit: Payer: Medicare HMO | Admitting: Internal Medicine

## 2019-01-18 ENCOUNTER — Other Ambulatory Visit: Payer: Self-pay

## 2019-01-18 DIAGNOSIS — I4892 Unspecified atrial flutter: Secondary | ICD-10-CM

## 2019-01-18 DIAGNOSIS — Z950 Presence of cardiac pacemaker: Secondary | ICD-10-CM

## 2019-01-18 DIAGNOSIS — I442 Atrioventricular block, complete: Secondary | ICD-10-CM | POA: Diagnosis not present

## 2019-01-18 NOTE — Progress Notes (Signed)
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  Since his pacemaker implant, the patient reports doing very well.  Today, he denies symptoms of palpitations, chest pain, shortness of breath,  lower extremity edema, dizziness, presyncope, or syncope.  The patient is otherwise without complaint today.   Past Medical History:  Diagnosis Date  . BCE (basal cell epithelioma), arm    RIGHT SHOULDER  . Bradycardia   . CAD (coronary artery disease)    a. s/p CABGx2 10/11/18 LIMA to LAD, SVG to RCA, EVH via right thigh.  . Dyslipidemia   . Ectatic thoracic aorta ([place])   . Esophageal stricture   . Hyperlipidemia   . Incidental pulmonary nodule, > 70mm and < 22mm 10/04/2018   Noted on CTA  . Inguinal hernia    bilateral  . Obesity   . S/P aortic valve replacement with bioprosthetic valve 10/11/2018   23 mm Edwards Inspiris Resilia stented bovine pericardial tissue valve  . S/P CABG x 2 10/11/2018   LIMA to LAD, SVG to RCA, EVH via right thigh  . S/P mitral valve replacement with bioprosthetic valve 10/11/2018   27 mm Medtronic Mosaic stented porcine bioprosthetic tissue valve  . S/P patent foramen ovale closure 10/11/2018  . S/P placement of cardiac pacemaker   . Smoker    Past Surgical History:  Procedure Laterality Date  . AORTIC VALVE REPLACEMENT N/A 10/11/2018   Procedure: AORTIC VALVE REPLACEMENT (AVR) with 23 Inspiris Bioprosthetic Aortic valve.;  Surgeon: Rexene Alberts, MD;  Location: [facility];  Service: Open Heart Surgery;  Laterality: N/A;  . CLIPPING OF ATRIAL APPENDAGE N/A 10/11/2018   Procedure: CLIPPING OF LEFT ATRIAL APPENDAGE with 45 AtriCure Clip.;  Surgeon: Rexene Alberts, MD;  Location: [facility];  Service: Open Heart Surgery;  Laterality: N/A;  . COLONOSCOPY  03/2008,2004   Dr. Amedeo Plenty  . CORONARY ARTERY BYPASS GRAFT N/A 10/11/2018   Procedure: CORONARY  ARTERY BYPASS GRAFTING (CABG) x 2 using LIMA to the LAD and endoscopic greater saphenous vein harvesting to the RCA.;  Surgeon: Rexene Alberts, MD;  Location: [facility];  Service: Open Heart Surgery;  Laterality: N/A;  . HERNIA REPAIR  12/11/10   BIH  . MITRAL VALVE REPLACEMENT N/A 10/11/2018   Procedure: MITRAL VALVE (MV) REPLACEMENT with 27 Mosaic Bioprosthetic Mitral valve.;  Surgeon: Rexene Alberts, MD;  Location: [facility];  Service: Open Heart Surgery;  Laterality: N/A;  . PACEMAKER IMPLANT N/A 10/17/2018   Procedure: PACEMAKER IMPLANT;  Surgeon: Thompson Grayer, MD;  Location: [facility];  Service: Cardiovascular;  Laterality: N/A;  . REPAIR OF PATENT FORAMEN OVALE N/A 10/11/2018   Procedure: CLOSURE OF PATENT FORAMEN OVALE;  Surgeon: Rexene Alberts, MD;  Location: [facility];  Service: Open Heart Surgery;  Laterality: N/A;  . RIGHT/LEFT HEART CATH AND CORONARY ANGIOGRAPHY N/A 09/15/2018   Procedure: RIGHT/LEFT HEART CATH AND CORONARY ANGIOGRAPHY;  Surgeon: Burnell Blanks, MD;  Location: [facility];  Service: Cardiovascular;  Laterality: N/A;  . TEE WITHOUT CARDIOVERSION N/A 12/12/2015   Procedure: TRANSESOPHAGEAL ECHOCARDIOGRAM (TEE);  Surgeon: Sanda Klein, MD;  Location: [facility];  Service: Cardiovascular;  Laterality: N/A;  . TEE WITHOUT CARDIOVERSION N/A 10/11/2018   Procedure: TRANSESOPHAGEAL ECHOCARDIOGRAM (TEE);  Surgeon: Rexene Alberts, MD;  Location: [facility];  Service: Open Heart Surgery;  Laterality: N/A;  .  THYMECTOMY  1976    ROS- all systems are reviewed and negative except as per HPI above  Current Outpatient Medications  Medication Sig Dispense Refill  . aspirin 81 MG tablet Take 81 mg by mouth every other day.     Marland Kitchen atorvastatin (LIPITOR) 20 MG tablet Take 1 tablet (20 mg total) by mouth daily at 6 PM. 90 tablet 3  . calcium carbonate (TUMS - DOSED IN MG ELEMENTAL CALCIUM) 500 MG chewable tablet Chew 1 tablet by mouth as needed for indigestion or heartburn.     . metoprolol tartrate (LOPRESSOR) 25 MG tablet Take 1 tablet (25 mg total) by mouth 2 (two) times daily. 180 tablet 3  . oxymetazoline (AFRIN) 0.05 % nasal spray Place 1 spray into both nostrils at bedtime as needed for congestion.    Marland Kitchen warfarin (COUMADIN) 2.5 MG tablet Take 2 tablets daily except 3 tablets on Tuesday, Thursday, Saturday or as directed by the Coumadin Clinic. 80 tablet 1   No current facility-administered medications for this visit.    Physical Exam: Vitals:   01/18/19 1512  BP: 126/88  Pulse: (!) 114  SpO2: 95%  Weight: 227 lb (103 kg)  Height: 5\' 9"  (1.753 m)    GEN- The patient is well appearing, alert and oriented x 3 today.   Head- normocephalic, atraumatic Eyes-  Sclera clear, conjunctiva pink Ears- hearing intact Oropharynx- clear Lungs-  normal work of breathing Chest- pacemaker pocket is well healed Heart- tachycardic regular rhythm GI- soft, NT, ND, + BS Extremities- no clubbing, cyanosis, or edema  Pacemaker interrogation- reviewed in detail today,  See PACEART report  ekg tracing ordered today is personally reviewed and shows sinus tachycardia, L bundle paced  Assessment and Plan:  1. Symptomatic complete heart block Normal pacemaker function See Pace Art report No changes today he is not device dependant today  2. S/p AVR/MVR/CABG Stable No change required today  3. Atrial flutter chads2vasc score is 3 Now in sinus  carelink Return to see Joseph Art in a year  Thompson Grayer MD, [facility] 01/18/2019 3:46 PM

## 2019-01-18 NOTE — Patient Instructions (Addendum)
Medication Instructions:  Your physician recommends that you continue on your current medications as directed. Please refer to the Current Medication list given to you today.  Labwork: None ordered.  Testing/Procedures: None ordered.  Follow-Up: Your physician wants you to follow-up in: one year with Tommye Standard, PA.   You will receive a reminder letter in the mail two months in advance. If you don't receive a letter, please call our office to schedule the follow-up appointment.  Remote monitoring is used to monitor your Pacemaker from home. This monitoring reduces the number of office visits required to check your device to one time per year. It allows [place] to keep an eye on the functioning of your device to ensure it is working properly. You are scheduled for a device check from home on 04/13/2019. You may send your transmission at any time that day. If you have a wireless device, the transmission will be sent automatically. After your physician reviews your transmission, you will receive a postcard with your next transmission date.  Any Other Special Instructions Will Be Listed Below (If Applicable).  If you need a refill on your cardiac medications before your next appointment, please call your pharmacy.

## 2019-01-19 DIAGNOSIS — Z951 Presence of aortocoronary bypass graft: Secondary | ICD-10-CM | POA: Diagnosis not present

## 2019-01-19 DIAGNOSIS — Z954 Presence of other heart-valve replacement: Secondary | ICD-10-CM | POA: Diagnosis not present

## 2019-01-22 ENCOUNTER — Ambulatory Visit (INDEPENDENT_AMBULATORY_CARE_PROVIDER_SITE_OTHER): Payer: Medicare HMO | Admitting: *Deleted

## 2019-01-22 ENCOUNTER — Other Ambulatory Visit: Payer: Self-pay

## 2019-01-22 DIAGNOSIS — Z953 Presence of xenogenic heart valve: Secondary | ICD-10-CM

## 2019-01-22 DIAGNOSIS — Z951 Presence of aortocoronary bypass graft: Secondary | ICD-10-CM | POA: Diagnosis not present

## 2019-01-22 DIAGNOSIS — Z8774 Personal history of (corrected) congenital malformations of heart and circulatory system: Secondary | ICD-10-CM

## 2019-01-22 DIAGNOSIS — Z954 Presence of other heart-valve replacement: Secondary | ICD-10-CM | POA: Diagnosis not present

## 2019-01-22 DIAGNOSIS — Z5181 Encounter for therapeutic drug level monitoring: Secondary | ICD-10-CM

## 2019-01-22 LAB — POCT INR: INR: 1.8 — AB (ref 2.0–3.0)

## 2019-01-22 NOTE — Patient Instructions (Addendum)
Description   Today take 2.5 tablets then continue same dosage 2 tablets daily, except for 3 tablets on Tuesdays, Thursdays and Saturdays. Recheck INR in 3 weeks. Coumadin Clinic [phone] Main [phone].

## 2019-01-23 LAB — CUP PACEART INCLINIC DEVICE CHECK
Date Time Interrogation Session: 20201210115940
Implantable Lead Implant Date: 20200908
Implantable Lead Location: 753859
Implantable Lead Location: 753860
Implantable Lead Model: 3830
Implantable Lead Model: 5076
Implantable Pulse Generator Implant Date: 20200908
Lead Channel Pacing Threshold Amplitude: 0.75 V
Lead Channel Pacing Threshold Amplitude: 1.25 V
Lead Channel Pacing Threshold Pulse Width: 0.4 ms
Lead Channel Pacing Threshold Pulse Width: 0.4 ms
Lead Channel Sensing Intrinsic Amplitude: 11.3 mV
Lead Channel Sensing Intrinsic Amplitude: 2.4 mV
Lead Channel Setting Pacing Amplitude: 2 V
Lead Channel Setting Pacing Amplitude: 2.5 V
Lead Channel Setting Pacing Pulse Width: 0.4 ms
Lead Channel Setting Sensing Sensitivity: 1.2 mV

## 2019-01-24 DIAGNOSIS — Z954 Presence of other heart-valve replacement: Secondary | ICD-10-CM | POA: Diagnosis not present

## 2019-01-24 DIAGNOSIS — Z951 Presence of aortocoronary bypass graft: Secondary | ICD-10-CM | POA: Diagnosis not present

## 2019-01-26 DIAGNOSIS — Z954 Presence of other heart-valve replacement: Secondary | ICD-10-CM | POA: Diagnosis not present

## 2019-01-26 DIAGNOSIS — Z951 Presence of aortocoronary bypass graft: Secondary | ICD-10-CM | POA: Diagnosis not present

## 2019-01-29 DIAGNOSIS — Z954 Presence of other heart-valve replacement: Secondary | ICD-10-CM | POA: Diagnosis not present

## 2019-01-29 DIAGNOSIS — Z951 Presence of aortocoronary bypass graft: Secondary | ICD-10-CM | POA: Diagnosis not present

## 2019-01-30 ENCOUNTER — Ambulatory Visit: Payer: Medicare HMO | Admitting: Radiation Oncology

## 2019-01-30 DIAGNOSIS — Z954 Presence of other heart-valve replacement: Secondary | ICD-10-CM | POA: Diagnosis not present

## 2019-01-30 DIAGNOSIS — Z951 Presence of aortocoronary bypass graft: Secondary | ICD-10-CM | POA: Diagnosis not present

## 2019-01-31 DIAGNOSIS — Z954 Presence of other heart-valve replacement: Secondary | ICD-10-CM | POA: Diagnosis not present

## 2019-01-31 DIAGNOSIS — Z951 Presence of aortocoronary bypass graft: Secondary | ICD-10-CM | POA: Diagnosis not present

## 2019-02-05 NOTE — Progress Notes (Signed)
GU Location of Tumor / Histology: prostatic adenocarcinoma  If Prostate Cancer, Gleason Score is (3 + 4) and PSA is (5.64). Prostate volume: 36.16 grams.   [name] was diagnosed with prostate cancer in July 2020. Patient opted for active surveillance until after cardiac surgery in the fall. A pacemaker has been placed and now patient is ready to pursue treatment for prostate ca.   Patient had a telehealth visit with Dr. Lovena Neighbours in August 2020. Dr. Jackson Latino notes from that encounter note his intent to draw a PSA in February 2020. Patient has not had a PSA drawn since June 2020.  Biopsies of prostate (if applicable) revealed:   Past/Anticipated interventions by urology, if any: prostate biopsy, active surveillance, referral for consideration of radiotherapy  Past/Anticipated interventions by medical oncology, if any: no  Weight changes, if any: denies  Bowel/Bladder complaints, if any: IPSS 4. SHIM 2. Denies dysuria, hematuria, urinary leakage or incontinence. Denies any bowel complaints. Reports when he voids its a steady stream with a dribble at the end.    Nausea/Vomiting, if any: denies  Pain issues, if any:  denies  SAFETY ISSUES:  Prior radiation? Yes, 1975. Cyst around thymus gland that was excised then radiated.  Pacemaker/ICD? yes  Possible current pregnancy? no, male patient  Is the patient on methotrexate? denies  Current Complaints / other details:  68 year old male. Married. Former smoker. Mother with colon ca. Father with lung ca.

## 2019-02-06 ENCOUNTER — Encounter: Payer: Self-pay | Admitting: Radiation Oncology

## 2019-02-06 ENCOUNTER — Other Ambulatory Visit: Payer: Self-pay

## 2019-02-06 ENCOUNTER — Ambulatory Visit
Admission: RE | Admit: 2019-02-06 | Discharge: 2019-02-06 | Disposition: A | Discharge status: Home / Self Care | Payer: Medicare HMO | Source: Ambulatory Visit | Attending: Radiation Oncology | Admitting: Radiation Oncology

## 2019-02-06 VITALS — Ht 69.0 in | Wt 217.6 lb

## 2019-02-06 DIAGNOSIS — C61 Malignant neoplasm of prostate: Secondary | ICD-10-CM | POA: Diagnosis not present

## 2019-02-06 DIAGNOSIS — Z950 Presence of cardiac pacemaker: Secondary | ICD-10-CM | POA: Diagnosis not present

## 2019-02-06 DIAGNOSIS — R972 Elevated prostate specific antigen [PSA]: Secondary | ICD-10-CM | POA: Diagnosis not present

## 2019-02-06 HISTORY — DX: Malignant neoplasm of prostate: C61

## 2019-02-06 NOTE — Progress Notes (Addendum)
Radiation Oncology         [phone] ________________________________  Initial outpatient Consultation - Conducted via MyChart due to current COVID-19 concerns for limiting patient exposure  Name: [name] MRN: [id number]  Date: 02/06/2019  DOB: Mar 06, 1950  [id number], Elyse Jarvis, MD  Davis Gourd*   REFERRING PHYSICIAN: Davis Gourd*  DIAGNOSIS: 68 y.o. gentleman with Stage T1c adenocarcinoma of the prostate with Gleason score of 3+4, and PSA of 5.64.    ICD-10-CM   1. Malignant neoplasm of prostate ([place])  C61     HISTORY OF PRESENT ILLNESS: [name] is a 68 y.o. male with a diagnosis of prostate cancer. He was noted to have an elevated PSA of 5.3 by his primary care physician, Dr. Redmond School.  Accordingly, he was referred for evaluation in urology by Dr. Lovena Neighbours on 07/14/2018,  digital rectal examination was performed at that time revealing bilateral lobe firmness.  Repeat PSA performed at that time remained elevated at 5.64.  The patient proceeded to transrectal ultrasound with 12 biopsies of the prostate on 08/10/2018.  The prostate volume measured 36.16 cc.  Out of 12 core biopsies, 1 was positive with a Gleason score of 3+4 in 5% of the right apex lateral. After discussing the biopsy results with his urologist at that time, he elected for active surveillance due to the urgent need for CABG with aortic and mitral valve replacement which was performed by Dr. Roxy Manns on 10/11/2018 and pacemaker placement by Dr. Rayann Heman on 10/17/2018.  He has since recovered from this procedure and is now ready to move forward with treatment of his prostate cancer.  The patient reviewed the biopsy results with his urologist and he has kindly been referred today for discussion of potential radiation treatment options.  PREVIOUS RADIATION THERAPY: Yes  1976: Postop mediastinal XRT following thymectomy.  PAST MEDICAL HISTORY:  Past Medical History:  Diagnosis Date  . BCE (basal cell  epithelioma), arm    RIGHT SHOULDER  . Bradycardia   . CAD (coronary artery disease)    a. s/p CABGx2 10/11/18 LIMA to LAD, SVG to RCA, EVH via right thigh.  . Dyslipidemia   . Ectatic thoracic aorta ([place])   . Esophageal stricture   . Hyperlipidemia   . Incidental pulmonary nodule, > 77mm and < 65mm 10/04/2018   Noted on CTA  . Inguinal hernia    bilateral  . Obesity   . Prostate cancer ([place])   . S/P aortic valve replacement with bioprosthetic valve 10/11/2018   23 mm Edwards Inspiris Resilia stented bovine pericardial tissue valve  . S/P CABG x 2 10/11/2018   LIMA to LAD, SVG to RCA, EVH via right thigh  . S/P mitral valve replacement with bioprosthetic valve 10/11/2018   27 mm Medtronic Mosaic stented porcine bioprosthetic tissue valve  . S/P patent foramen ovale closure 10/11/2018  . S/P placement of cardiac pacemaker   . Smoker       PAST SURGICAL HISTORY: Past Surgical History:  Procedure Laterality Date  . AORTIC VALVE REPLACEMENT N/A 10/11/2018   Procedure: AORTIC VALVE REPLACEMENT (AVR) with 23 Inspiris Bioprosthetic Aortic valve.;  Surgeon: Rexene Alberts, MD;  Location: [facility];  Service: Open Heart Surgery;  Laterality: N/A;  . CLIPPING OF ATRIAL APPENDAGE N/A 10/11/2018   Procedure: CLIPPING OF LEFT ATRIAL APPENDAGE with 45 AtriCure Clip.;  Surgeon: Rexene Alberts, MD;  Location: [facility];  Service: Open Heart Surgery;  Laterality: N/A;  . COLONOSCOPY  03/2008,2004  Dr. Amedeo Plenty  . CORONARY ARTERY BYPASS GRAFT N/A 10/11/2018   Procedure: CORONARY ARTERY BYPASS GRAFTING (CABG) x 2 using LIMA to the LAD and endoscopic greater saphenous vein harvesting to the RCA.;  Surgeon: Rexene Alberts, MD;  Location: [facility];  Service: Open Heart Surgery;  Laterality: N/A;  . HERNIA REPAIR  12/11/10   BIH  . MITRAL VALVE REPLACEMENT N/A 10/11/2018   Procedure: MITRAL VALVE (MV) REPLACEMENT with 27 Mosaic Bioprosthetic Mitral valve.;  Surgeon: Rexene Alberts, MD;  Location: [facility];  Service: Open Heart  Surgery;  Laterality: N/A;  . PACEMAKER IMPLANT N/A 10/17/2018   Procedure: PACEMAKER IMPLANT;  Surgeon: Thompson Grayer, MD;  Location: [facility];  Service: Cardiovascular;  Laterality: N/A;  . REPAIR OF PATENT FORAMEN OVALE N/A 10/11/2018   Procedure: CLOSURE OF PATENT FORAMEN OVALE;  Surgeon: Rexene Alberts, MD;  Location: [facility];  Service: Open Heart Surgery;  Laterality: N/A;  . RIGHT/LEFT HEART CATH AND CORONARY ANGIOGRAPHY N/A 09/15/2018   Procedure: RIGHT/LEFT HEART CATH AND CORONARY ANGIOGRAPHY;  Surgeon: Burnell Blanks, MD;  Location: [facility];  Service: Cardiovascular;  Laterality: N/A;  . TEE WITHOUT CARDIOVERSION N/A 12/12/2015   Procedure: TRANSESOPHAGEAL ECHOCARDIOGRAM (TEE);  Surgeon: Sanda Klein, MD;  Location: [facility];  Service: Cardiovascular;  Laterality: N/A;  . TEE WITHOUT CARDIOVERSION N/A 10/11/2018   Procedure: TRANSESOPHAGEAL ECHOCARDIOGRAM (TEE);  Surgeon: Rexene Alberts, MD;  Location: [facility];  Service: Open Heart Surgery;  Laterality: N/A;  . THYMECTOMY  1976    FAMILY HISTORY:  Family History  Problem Relation Age of Onset  . Colon cancer Mother   . Lung cancer Father        smoker  . CAD Neg Hx   . Pancreatic cancer Neg Hx   . Breast cancer Neg Hx     SOCIAL HISTORY:  Social History   Socioeconomic History  . Marital status: Married    Spouse name: Not on file  . Number of children: 0  . Years of education: Not on file  . Highest education level: Not on file  Occupational History  . Occupation: Programme researcher, broadcasting/film/video: [employer]  Tobacco Use  . Smoking status: Former Smoker    Packs/day: 0.25    Years: 35.00    Pack years: 8.75    Types: Cigarettes    Quit date: 09/04/2018    Years since quitting: 0.4  . Smokeless tobacco: Never Used  Substance and Sexual Activity  . Alcohol use: Yes    Alcohol/week: 1.0 standard drinks    Types: 1 Cans of beer per week  . Drug use: No  . Sexual activity: Yes  Other Topics  Concern  . Not on file  Social History Narrative  . Not on file   Social Determinants of Health   Financial Resource Strain:   . Difficulty of Paying Living Expenses: Not on file  Food Insecurity:   . Worried About Charity fundraiser in the Last Year: Not on file  . Ran Out of Food in the Last Year: Not on file  Transportation Needs:   . Lack of Transportation (Medical): Not on file  . Lack of Transportation (Non-Medical): Not on file  Physical Activity:   . Days of Exercise per Week: Not on file  . Minutes of Exercise per Session: Not on file  Stress:   . Feeling of Stress : Not on file  Social Connections:   . Frequency  of Communication with Friends and Family: Not on file  . Frequency of Social Gatherings with Friends and Family: Not on file  . Attends Religious Services: Not on file  . Active Member of Clubs or Organizations: Not on file  . Attends Archivist Meetings: Not on file  . Marital Status: Not on file  Intimate Partner Violence:   . Fear of Current or Ex-Partner: Not on file  . Emotionally Abused: Not on file  . Physically Abused: Not on file  . Sexually Abused: Not on file    ALLERGIES: Tylenol [acetaminophen]  MEDICATIONS:  Current Outpatient Medications  Medication Sig Dispense Refill  . aspirin 81 MG tablet Take 81 mg by mouth every other day.     Marland Kitchen atorvastatin (LIPITOR) 20 MG tablet Take 1 tablet (20 mg total) by mouth daily at 6 PM. 90 tablet 3  . calcium carbonate (TUMS - DOSED IN MG ELEMENTAL CALCIUM) 500 MG chewable tablet Chew 1 tablet by mouth as needed for indigestion or heartburn.    . metoprolol tartrate (LOPRESSOR) 25 MG tablet Take 1 tablet (25 mg total) by mouth 2 (two) times daily. 180 tablet 3  . oxymetazoline (AFRIN) 0.05 % nasal spray Place 1 spray into both nostrils at bedtime as needed for congestion.    Marland Kitchen warfarin (COUMADIN) 2.5 MG tablet Take 2 tablets daily except 3 tablets on Tuesday, Thursday, Saturday or as directed by  the Coumadin Clinic. 80 tablet 1   No current facility-administered medications for this encounter.    REVIEW OF SYSTEMS:  On review of systems, the patient reports that he is doing well overall. He denies any chest pain, shortness of breath, cough, fevers, chills, night sweats, or unintended weight changes. He denies any bowel disturbances, and denies abdominal pain, nausea or vomiting. He denies any new musculoskeletal or joint aches or pains. His IPSS was 4, indicating mild urinary symptoms. He reports occasional post-void dribble. His SHIM was 2, indicating he has severe erectile dysfunction. A complete review of systems is obtained and is otherwise negative.    PHYSICAL EXAM:  Wt Readings from Last 3 Encounters:  02/06/19 217 lb 9.6 oz (98.7 kg)  01/18/19 227 lb (103 kg)  01/08/19 227 lb 2.2 oz (103 kg)   Temp Readings from Last 3 Encounters:  01/08/19 (!) 96.4 F (35.8 C)  11/13/18 97.7 F (36.5 C)  10/20/18 98.5 F (36.9 C) (Oral)   BP Readings from Last 3 Encounters:  01/18/19 126/88  01/08/19 129/88  12/14/18 132/80   Pulse Readings from Last 3 Encounters:  01/18/19 (!) 114  01/08/19 (!) 107  12/14/18 94   Pain Assessment Pain Score: 0-No pain/10  In general this is a well appearing Caucasian gentleman in no acute distress. He's alert and oriented x4 and appropriate throughout the examination. Cardiopulmonary assessment is negative for acute distress and he exhibits normal effort.    KPS = 90  100 - Normal; no complaints; no evidence of disease. 90   - Able to carry on normal activity; minor signs or symptoms of disease. 80   - Normal activity with effort; some signs or symptoms of disease. 79   - Cares for self; unable to carry on normal activity or to do active work. 60   - Requires occasional assistance, but is able to care for most of his personal needs. 50   - Requires considerable assistance and frequent medical care. 1   - Disabled; requires special care  and assistance.  60   - Severely disabled; hospital admission is indicated although death not imminent. 2   - Very sick; hospital admission necessary; active supportive treatment necessary. 10   - Moribund; fatal processes progressing rapidly. 0     - Dead  Karnofsky DA, Abelmann Park[place], Craver LS and Burchenal JH [phone] The use of the nitrogen mustards in the palliative treatment of carcinoma: with particular reference to bronchogenic carcinoma Cancer 1 634-56  LABORATORY DATA:  Lab Results  Component Value Date   WBC 8.3 11/02/2018   HGB 11.0 (L) 11/02/2018   HCT 34.0 (L) 11/02/2018   MCV 82 11/02/2018   PLT 383 11/02/2018   Lab Results  Component Value Date   NA 139 12/14/2018   K 4.2 12/14/2018   CL 102 12/14/2018   CO2 22 12/14/2018   Lab Results  Component Value Date   ALT 15 12/14/2018   AST 19 12/14/2018   ALKPHOS 79 12/14/2018   BILITOT 0.6 12/14/2018     RADIOGRAPHY: CUP PACEART INCLINIC DEVICE CHECK  Result Date: 01/23/2019 Pacemaker check in clinic by [place]. RA output reduced to 2.0V, RV output reduced to 2.5V. 1 NSVT--6 beats. See scanned report for details. [facility] on 04/13/19 and ROV with EP APP in 1 year.Levander Campion BSN, RN, CCDS     IMPRESSION/PLAN: This visit was conducted via MyChart to spare the patient unnecessary potential exposure in the healthcare setting during the current COVID-19 pandemic. 1. 68 y.o. gentleman with Stage T1c adenocarcinoma of the prostate with Gleason Score of 3+4, and PSA of 5.64. We discussed the patient's workup and outlined the nature of prostate cancer in this setting. The patient's T stage, Gleason's score, and PSA put him into the favorable intermediate risk group. Accordingly, he is eligible for a variety of potential treatment options including brachytherapy, 5.5 weeks of external radiation, or prostatectomy. We discussed the available radiation techniques, and focused on the details and logistics of delivery. We discussed  and outlined the risks, benefits, short and long-term effects associated with radiotherapy and compared and contrasted these with prostatectomy. We discussed the role of SpaceOAR in reducing the rectal toxicity associated with radiotherapy. He was encouraged to ask questions which were answered to his stated satisfaction.  At the end of the conversation, the patient is most interested in moving forward with brachytherapy and use of SpaceOAR to reduce rectal toxicity from radiotherapy.  We will share our discussion with Dr. Lovena Neighbours and request a repeat PSA in the near future so that we have an accurate baseline PSA prior to treatment since his last PSA was obtained in 07/2018.  We will also reach out to his cardiologist for cardiac clearance to proceed and once we have clearance, we will move forward with scheduling his CT [facility] planning appointment in the near future.  The patient will be contacted by Romie Jumper in our office who will be working closely with him to coordinate OR scheduling and pre and post procedure appointments.  We will contact the pharmaceutical rep to ensure that [place] is available at the time of procedure.  He will NOT have a prostate MRI following his post-seed CT SIM due to having a pacemaker.  He appears to have a good understanding of his disease and our treatment recommendations which are of curative intent and he is comfortable and in agreement with the stated plan.  Given current concerns for patient exposure during the COVID-19 pandemic, this encounter was conducted via video-enabled MyChart visit. The patient has given  verbal consent for this type of encounter. The time spent during this encounter was 45 minutes. The attendants for this meeting include Tyler Pita MD, Sherice Ijames PA-C, [place], and patient, [name]. During the encounter, Tyler Pita MD, Shanicka Oldenkamp PA-C, and scribe, Wilburn Mylar were located at [place].  Patient, [name] was located at home.    Nicholos Johns, PA-C    Tyler Pita, MD  [facility] Direct Dial: [phone]  Fax: [phone] .com  Skype  LinkedIn  This document serves as a record of services personally performed by Tyler Pita, MD and Freeman Caldron, PA-C. It was created on their behalf by Wilburn Mylar, a trained medical scribe. The creation of this record is based on the scribe's personal observations and the provider's statements to them. This document has been checked and approved by the attending provider.

## 2019-02-08 ENCOUNTER — Telehealth: Payer: Self-pay | Admitting: Pharmacist

## 2019-02-08 ENCOUNTER — Telehealth: Payer: Self-pay | Admitting: Cardiovascular Disease

## 2019-02-08 ENCOUNTER — Telehealth: Payer: Self-pay | Admitting: *Deleted

## 2019-02-08 NOTE — Telephone Encounter (Signed)
New Message     [facility] Pre-operative Risk Assessment    Request for surgical clearance:  1. What type of surgery is being performed? Prostate implant  2. When is this surgery scheduled? TBD  3. What type of clearance is required (medical clearance vs. Pharmacy clearance to hold med vs. Both)? Cardiac clearance  4. Are there any medications that need to be held prior to surgery and how long? aspirin 81 MG tablet  5. Practice name and name of physician performing surgery? Dr. Lovena Neighbours from [facility] and Dr. Tammi Klippel from [facility]  6. What is your office phone number? [phone]   7.   What is your office fax number? [phone] attn to [place]  8.   Anesthesia type (None, local, MAC, general) ? General   [name] 02/08/2019, 10:14 AM  _________________________________________________________________   (provider comments below)

## 2019-02-08 NOTE — Telephone Encounter (Signed)
Dr. Angelena Form,  [name] is a 68yo male with PMH CAD s/p CABGx2 with pericardial AVR, MVR, and PFO closure [id number] complicated by post-op CHB and atrial flutter requiring PPM, ectatic thoracic aorta, HLD, pulmonary nodules, esophageal stricture, prostate CA.   He was undergoing evaluation for prostate cancer, but had to be delayed due to need for CABG and valve replacement detailed above.   Per note by Dr. Tammi Klippel 02/06/19 the plan is "moving forward with brachytherapy and use of SpaceOAR to reduce rectal toxicity from radiotherapy". We have received request for cardiac clearance.  Seen 01/18/19 by Dr. Rayann Heman and was doing well. PPM functioning normally. Seen by Sande Rives, 12/14/18 and was doing well at that time as well.   I do not believe he needs a repeat office visit at this time. From my perspective, the benefits of the procedure outweigh the risks, but wanted to your opinion due to CABG/AVR/MVR within the year. He has not yet had a post-op echo.  If surgery deemed appropriate, please provide recommendations regarding his aspirin prior to procedure.   Please route your response to 'p cv div preop'.  I will route to pharmacy for recommendations regarding Warfarin.   Thanks, Michael Dubonnet, NP

## 2019-02-08 NOTE — Telephone Encounter (Signed)
CALLED PATIENT TO INFORM OF LAB DRAW (PSA) ON 02/16/19 @ 11:15 AM @ [facility], SPOKE WITH PATIENT AND HE IS AWARE OF THIS APPT.

## 2019-02-08 NOTE — Telephone Encounter (Signed)
Patient with diagnosis of ATRIAL FLUTTER & MVR on WARFARIN for anticoagulation.    Procedure: PROSTATE SpaceOAR PLACEMENT Date of procedure: TBD  CHADS2-VASc score of  3 (HTN, AGE, CAD) and mitral valve   SCr = 1.04 Platelet count = 383  Per office protocol, patient can hold  for 5 days prior to procedure (per Urologist protocol - tel [phone] ext [place]).  Patient will need bridging with Lovenox (enoxaparin) around procedure.   Demontay Grantham Rodriguez-Guzman PharmD, BCPS, [place] [address] [place] [address] 02/14/2019 11:12 AM

## 2019-02-08 NOTE — Telephone Encounter (Signed)
Urologist office will call back with request for anticoagulation prior to [facility] placement.  (request may variate between 1 to 5 days HOLD - per provider's request)

## 2019-02-08 NOTE — Telephone Encounter (Signed)
I think he can hold his ASA, No other cardiac workup needed. Appreciate input of pharmacy on coumadin. Gerald Stabs

## 2019-02-08 NOTE — Telephone Encounter (Signed)
Pharmacy, please make recommendations regarding Warfarin. Thank you! Loel Dubonnet, NP

## 2019-02-12 ENCOUNTER — Other Ambulatory Visit: Payer: Self-pay | Admitting: Cardiovascular Disease

## 2019-02-12 ENCOUNTER — Telehealth: Payer: Self-pay | Admitting: Radiation Oncology

## 2019-02-12 NOTE — Telephone Encounter (Signed)
Received voicemail message from patient requesting a return call. Phoned patient back. Patient reports he "may need to wait a little longer to get over his major heart surgery before starting radiation." Patient reports that after his consult he realized that he and Dr. Tammi Klippel never discussed time frames. Patient states,"I need to mentally prepare myself." Patient explains he wants to have treatment/brachytherapy before the summer but not begin for another couple months. Patient understands this RN will inform Dr. Tammi Klippel and his scheduler, Enid Derry, of these findings and phone him back tomorrow with their thoughts. Patient verbalized understanding and expressed appreciation for the call back.   Noted patient is scheduled for a PSA check at [facility] on 02/16/2019 since his PSA hasn't been checked since June.

## 2019-02-13 ENCOUNTER — Other Ambulatory Visit: Payer: Self-pay

## 2019-02-13 ENCOUNTER — Ambulatory Visit (INDEPENDENT_AMBULATORY_CARE_PROVIDER_SITE_OTHER): Payer: Medicare HMO | Admitting: *Deleted

## 2019-02-13 ENCOUNTER — Telehealth: Payer: Self-pay | Admitting: Medical Oncology

## 2019-02-13 DIAGNOSIS — Z5181 Encounter for therapeutic drug level monitoring: Secondary | ICD-10-CM

## 2019-02-13 DIAGNOSIS — Z953 Presence of xenogenic heart valve: Secondary | ICD-10-CM

## 2019-02-13 DIAGNOSIS — Z951 Presence of aortocoronary bypass graft: Secondary | ICD-10-CM | POA: Diagnosis not present

## 2019-02-13 DIAGNOSIS — Z8774 Personal history of (corrected) congenital malformations of heart and circulatory system: Secondary | ICD-10-CM | POA: Diagnosis not present

## 2019-02-13 LAB — POCT INR: INR: 1.6 — AB (ref 2.0–3.0)

## 2019-02-13 NOTE — Telephone Encounter (Signed)
Spoke with patient to introduce myself as the prostate nurse navigator and discuss my role. I was unable to meet him 12/29 when he consulted with Dr. Tammi Klippel. He informed me he has chosen seed implant to treat his cancer. He informed me he had heart surgery in the fall and has just completed cardiac rehab. He is just beginning to get his energy back and would like to delay seed implant for a couple of months if possible. He states he spoke with Sam, Dr. Johny Shears nurse yesterday about delaying. She is going to follow up with Dr. Tammi Klippel. I told him it usually takes a month or two to get seed implants scheduled and with COVID on the rise, surgeries may be delayed unless an emergency.He is scheduled for PSA check 1/8 at [facility] and he confirmed appointment. I gave him my contact information and asked him to call me with questions or concerns. He voiced understanding.

## 2019-02-13 NOTE — Patient Instructions (Signed)
Description     Take 10 mg (4 tabs) today, then start taking 7.5 mg (3 tabs) daily except for 5 mg (2 tabs) on Monday and Friday.  Recheck INR in 1-2 weeks. Coumadin Clinic [phone] Main [phone].

## 2019-02-14 ENCOUNTER — Telehealth: Payer: Self-pay | Admitting: Radiation Oncology

## 2019-02-14 NOTE — Telephone Encounter (Signed)
At the time of his consult we did advise that it would likely be a minimum of 4-6 weeks before we could get him on the schedule for the procedure but understandable that this information may not have been retained since we discussed a lot during his visit.  His cardiologist, Dr. Angelena Form has given cardiac clearance to proceed but please reassure him that we will ensure his procedure is not scheduled prior to March 2021, to allow him additional time to recover from his heart procedures but would not recommend delaying beyond March since this has already been significantly delayed due to need for heart surgery. Enid Derry, Please relay this information to [facility] and look to coordinate his procedure for March 2021. Thank you!!! -Mylon Mabey

## 2019-02-14 NOTE — Telephone Encounter (Signed)
   Primary Cardiologist: Lauree Chandler, MD  Chart reviewed as part of pre-operative protocol coverage. Per Dr. Angelena Form, patient is at acceptable risk for the planned procedure without further cardiovascular testing. He can hold his aspirin 7 days prior to his procedure. Pharmacy recommends holding coumadin 5 days prior to his procedure. He is recommended for lovenox bridging in the perioperative setting which will be coordinated by the coumadin clinic. He should restart aspirin and coumadin when cleared to do so by his surgeon.   I will route this recommendation to the requesting party via Epic fax function and remove from pre-op pool.  Please call with questions.  Abigail Butts, PA-C 02/14/2019, 9:38 AM

## 2019-02-14 NOTE — Addendum Note (Signed)
Encounter addended by: Freeman Caldron, PA-C on: 02/14/2019 9:18 AM  Actions taken: Clinical Note Signed

## 2019-02-14 NOTE — Telephone Encounter (Signed)
Phoned patient back as requested by Ashlyn Bruning, PA-C. Explained to the patient that Dr. Angelena Form has given cardiac clearance to proceed with prostate treatment. Also, explained we will not schedule his procedure prior to March 2021 to allow him time to recover from heart surgery. Finally, explained our scheduler, Romie Jumper, has reached out to [facility] with this update. Advised patient to keep appointment for 02/16/2019 at [facility] to have his PSA checked. Patient verbalized understanding and appreciation for the return call.

## 2019-02-16 DIAGNOSIS — C61 Malignant neoplasm of prostate: Secondary | ICD-10-CM | POA: Diagnosis not present

## 2019-02-16 LAB — PSA: PSA: 5.78

## 2019-02-19 ENCOUNTER — Encounter: Payer: Self-pay | Admitting: Medical Oncology

## 2019-02-19 NOTE — Progress Notes (Signed)
Patient had PSA drawn 02/16/19 at [facility]- 5.78

## 2019-02-20 ENCOUNTER — Encounter: Payer: Self-pay | Admitting: *Deleted

## 2019-02-23 ENCOUNTER — Other Ambulatory Visit: Payer: Self-pay

## 2019-02-23 ENCOUNTER — Ambulatory Visit (INDEPENDENT_AMBULATORY_CARE_PROVIDER_SITE_OTHER): Payer: Medicare HMO

## 2019-02-23 ENCOUNTER — Ambulatory Visit (INDEPENDENT_AMBULATORY_CARE_PROVIDER_SITE_OTHER): Payer: Medicare HMO | Admitting: *Deleted

## 2019-02-23 DIAGNOSIS — I442 Atrioventricular block, complete: Secondary | ICD-10-CM | POA: Diagnosis not present

## 2019-02-23 DIAGNOSIS — Z5181 Encounter for therapeutic drug level monitoring: Secondary | ICD-10-CM

## 2019-02-23 DIAGNOSIS — Z8774 Personal history of (corrected) congenital malformations of heart and circulatory system: Secondary | ICD-10-CM

## 2019-02-23 DIAGNOSIS — Z953 Presence of xenogenic heart valve: Secondary | ICD-10-CM

## 2019-02-23 DIAGNOSIS — Z951 Presence of aortocoronary bypass graft: Secondary | ICD-10-CM

## 2019-02-23 LAB — CUP PACEART REMOTE DEVICE CHECK
Battery Remaining Longevity: 111 mo
Battery Voltage: 3.12 V
Brady Statistic AP VP Percent: 0.01 %
Brady Statistic AP VS Percent: 0 %
Brady Statistic AS VP Percent: 96.52 %
Brady Statistic AS VS Percent: 3.47 %
Brady Statistic RA Percent Paced: 0.01 %
Brady Statistic RV Percent Paced: 96.53 %
Date Time Interrogation Session: 20210115050948
Implantable Lead Implant Date: 20200908
Implantable Lead Location: 753859
Implantable Lead Location: 753860
Implantable Lead Model: 3830
Implantable Lead Model: 5076
Implantable Pulse Generator Implant Date: 20200908
Lead Channel Impedance Value: 247 Ohm
Lead Channel Impedance Value: 285 Ohm
Lead Channel Impedance Value: 399 Ohm
Lead Channel Impedance Value: 494 Ohm
Lead Channel Pacing Threshold Amplitude: 1.5 V
Lead Channel Pacing Threshold Pulse Width: 0.4 ms
Lead Channel Sensing Intrinsic Amplitude: 11.125 mV
Lead Channel Sensing Intrinsic Amplitude: 11.125 mV
Lead Channel Sensing Intrinsic Amplitude: 2.25 mV
Lead Channel Sensing Intrinsic Amplitude: 2.25 mV
Lead Channel Setting Pacing Amplitude: 2 V
Lead Channel Setting Pacing Amplitude: 3 V
Lead Channel Setting Pacing Pulse Width: 0.4 ms
Lead Channel Setting Sensing Sensitivity: 1.2 mV

## 2019-02-23 LAB — POCT INR: INR: 2.4 (ref 2.0–3.0)

## 2019-02-23 NOTE — Progress Notes (Signed)
PPM remote 

## 2019-02-23 NOTE — Patient Instructions (Signed)
Description   Continue on same dosage 7.5 mg (3 tabs) daily except for 5 mg (2 tabs) on Mondays and Fridays.  Recheck INR in 3 weeks. Coumadin Clinic [phone] Main [phone].

## 2019-02-26 ENCOUNTER — Telehealth: Payer: Self-pay | Admitting: Medical Oncology

## 2019-02-26 NOTE — Telephone Encounter (Signed)
Left a message to inform him PSA still elevated but stable which is encouraging that there has not likely been any significant change in his disease since the time of diagnosis in 07/2018. We still recommend proceeding with treatment sooner than later, to avoid potential disease progression. Dr. Forrest Moron have asked Enid Derry  to  schedule the brachytherapy  04/2019. I asked him to call me back with concerns or questions.

## 2019-02-27 ENCOUNTER — Telehealth: Payer: Self-pay | Admitting: *Deleted

## 2019-02-27 ENCOUNTER — Other Ambulatory Visit: Payer: Self-pay | Admitting: Urology

## 2019-02-27 NOTE — Telephone Encounter (Signed)
CALLED PATIENT TO INFORM OF PRE-SEED APPTS. FOR 03-08-19 AND HIS IMPLANT FOR 04-04-19, SPOKE WITH PATIENT AND HE IS AWARE OF THESE APPTS.

## 2019-02-28 ENCOUNTER — Encounter: Payer: Self-pay | Admitting: Family Medicine

## 2019-03-07 ENCOUNTER — Telehealth: Payer: Self-pay | Admitting: *Deleted

## 2019-03-07 NOTE — Telephone Encounter (Signed)
CALLED PATIENT TO REMIND OF PRE-SEED APPTS. FOR 03/08/19, LVM FOR A RETURN CALL

## 2019-03-08 ENCOUNTER — Other Ambulatory Visit: Payer: Self-pay

## 2019-03-08 ENCOUNTER — Encounter (HOSPITAL_COMMUNITY)
Admission: RE | Admit: 2019-03-08 | Discharge: 2019-03-08 | Disposition: A | Discharge status: Home / Self Care | Payer: Medicare HMO | Source: Ambulatory Visit | Attending: Urology | Admitting: Urology

## 2019-03-08 ENCOUNTER — Encounter: Payer: Self-pay | Admitting: Medical Oncology

## 2019-03-08 ENCOUNTER — Ambulatory Visit
Admission: RE | Admit: 2019-03-08 | Discharge: 2019-03-08 | Disposition: A | Discharge status: Home / Self Care | Payer: Medicare HMO | Source: Ambulatory Visit | Attending: Radiation Oncology | Admitting: Radiation Oncology

## 2019-03-08 ENCOUNTER — Ambulatory Visit (HOSPITAL_COMMUNITY)
Admission: RE | Admit: 2019-03-08 | Discharge: 2019-03-08 | Disposition: A | Discharge status: Home / Self Care | Payer: Medicare HMO | Source: Ambulatory Visit | Attending: Urology | Admitting: Urology

## 2019-03-08 ENCOUNTER — Ambulatory Visit
Admission: RE | Admit: 2019-03-08 | Discharge: 2019-03-08 | Disposition: A | Discharge status: Home / Self Care | Payer: Medicare HMO | Source: Ambulatory Visit | Attending: Urology | Admitting: Urology

## 2019-03-08 DIAGNOSIS — C61 Malignant neoplasm of prostate: Secondary | ICD-10-CM | POA: Diagnosis not present

## 2019-03-08 DIAGNOSIS — R079 Chest pain, unspecified: Secondary | ICD-10-CM | POA: Diagnosis not present

## 2019-03-08 NOTE — Progress Notes (Signed)
  Radiation Oncology         [phone] ________________________________  Name: OWENS REMALEY MRN: [id number]  Date: 03/08/2019  DOB: 13-Apr-1950  SIMULATION AND TREATMENT PLANNING NOTE PUBIC ARCH STUDY  [id number], Elyse Jarvis, MD  Denita Lung, MD  DIAGNOSIS: 69 y.o. gentleman with Stage T1c adenocarcinoma of the prostate with Gleason score of 3+4, and PSA of 5.64.     ICD-10-CM   1. Malignant neoplasm of prostate ([place])  C61     COMPLEX SIMULATION:  The patient presented today for evaluation for possible prostate seed implant. He was brought to the radiation planning suite and placed supine on the CT couch. A 3-dimensional image study set was obtained in upload to the planning computer. There, on each axial slice, I contoured the prostate gland. Then, using three-dimensional radiation planning tools I reconstructed the prostate in view of the structures from the transperineal needle pathway to assess for possible pubic arch interference. In doing so, I did not appreciate any pubic arch interference. Also, the patient's prostate volume was estimated based on the drawn structure. The volume was 36 cc.  Given the pubic arch appearance and prostate volume, patient remains a good candidate to proceed with prostate seed implant. Today, he freely provided informed written consent to proceed.    PLAN: The patient will undergo prostate seed implant.   ________________________________  Sheral Apley. Tammi Klippel, M.D.

## 2019-03-16 ENCOUNTER — Ambulatory Visit (INDEPENDENT_AMBULATORY_CARE_PROVIDER_SITE_OTHER): Payer: Medicare HMO | Admitting: *Deleted

## 2019-03-16 ENCOUNTER — Other Ambulatory Visit: Payer: Self-pay

## 2019-03-16 DIAGNOSIS — Z953 Presence of xenogenic heart valve: Secondary | ICD-10-CM

## 2019-03-16 DIAGNOSIS — Z951 Presence of aortocoronary bypass graft: Secondary | ICD-10-CM

## 2019-03-16 DIAGNOSIS — Z8774 Personal history of (corrected) congenital malformations of heart and circulatory system: Secondary | ICD-10-CM

## 2019-03-16 DIAGNOSIS — Z5181 Encounter for therapeutic drug level monitoring: Secondary | ICD-10-CM | POA: Diagnosis not present

## 2019-03-16 LAB — POCT INR: INR: 1.8 — AB (ref 2.0–3.0)

## 2019-03-16 MED ORDER — ENOXAPARIN SODIUM 100 MG/ML ~~LOC~~ SOLN: 100.0000 mg | Freq: Two times a day (BID) | SUBCUTANEOUS | 0 refills | Status: DC

## 2019-03-16 MED ORDER — WARFARIN SODIUM 2.5 MG PO TABS: ORAL_TABLET | ORAL | 2 refills | Status: DC

## 2019-03-16 NOTE — Patient Instructions (Addendum)
  2/18: Last dose of Coumadin.  2/19: No Coumadin or Lovenox.  2/20: Inject Lovenox  100 mg in the fatty abdominal tissue at least 2 inches from the belly button twice a day about 12 hours apart, 8am and 8pm rotate sites. No Coumadin.  2/21: Inject Lovenox in the fatty tissue every 12 hours, 8am and 8pm. No Coumadin.  2/22: Inject Lovenox in the fatty tissue every 12 hours, 8am and 8pm. No Coumadin.  2/23: Inject Lovenox in the fatty tissue in the morning at 8 am (No PM dose). No Coumadin.  2/24: Procedure Day - No Lovenox - Resume Coumadin in the evening or as directed by doctor. (take an extra half tablet with usual dose for 2 days then resume normal dose).    2/25: Resume Lovenox inject in the fatty tissue every 12 hours and take Coumadin.  2/26: Inject Lovenox in the fatty tissue every 12 hours and take Coumadin.  2/27: Inject Lovenox in the fatty tissue every 12 hours and take Coumadin.  2/28: Inject Lovenox in the fatty tissue every 12 hours and take Coumadin.  3/1: Inject Lovenox in the fatty tissue every 12 hours and take Coumadin.  3/2: Coumadin appt to check INR.  Description   Take 3 tablet today and then continue on same dosage 7.5 mg (3 tabs) daily except for 5 mg (2 tabs) on Mondays and Fridays.  Recheck INR  1 week post procedure. Follow bridge instructions. [facility] [phone] Main [phone].

## 2019-03-16 NOTE — Progress Notes (Signed)
Clearance note on 02/08/2019, Will hold Coumadin for 5 days prior to procedure.   Weight 103 kg Scr: 0.88, 11/02/2018 CrCl:117 ml/min

## 2019-03-20 ENCOUNTER — Telehealth: Payer: Self-pay | Admitting: Medical Oncology

## 2019-03-20 NOTE — Telephone Encounter (Signed)
Patient called asking if coming off coumadin and using Lovenox is standard. I discussed with him the reason for the bridging and he voiced understanding.

## 2019-03-27 ENCOUNTER — Encounter (HOSPITAL_BASED_OUTPATIENT_CLINIC_OR_DEPARTMENT_OTHER): Payer: Self-pay | Admitting: Urology

## 2019-03-27 ENCOUNTER — Telehealth: Payer: Self-pay | Admitting: *Deleted

## 2019-03-27 ENCOUNTER — Other Ambulatory Visit: Payer: Self-pay

## 2019-03-27 NOTE — Telephone Encounter (Signed)
CALLED PATIENT TO REMIND OF LAB AND COVID TESTING, LVM FOR A RETURN CALL 

## 2019-03-27 NOTE — Progress Notes (Addendum)
Spoke w/ via phone for pre-op interview---[facility] needs dos----     Has lab appt 2-22-201 for cbc, cmet, pt, ptt       COVID test ------04-02-2019 Arrive at -------730 am 04-04-2019 NPO after ------midnight Medications to take morning of surgery -----metoprolol tartrate Diabetic medication -----n/a Patient Special Instructions -----fleets enema am of surgery Pre-Op special Istructions ----- Patient verbalized understanding of instructions that were given at this phone interview. Patient denies shortness of breath, chest pain, fever, cough a this phone interview.  Anesthesia: pacemaker cabg, mitral and aortic valve replacement Addendum: spoke with jessica zanetto pa, pt does not need to be repeated day of surgery per anesthesia.  PCP:dr Jill Alexanders Cardiologist :dr Angelena Form Cardiac clearance Daleen Snook kroeger pa 02-14-2019 epic  Ep md: dr Jeneen Rinks allred, pacemaker orders on chart no rep needed per dr allred orders on chart Chest x-ray :03-08-2019 EKG :03-08-2019 Last pacemaker device check 02-23-2019 epic Echo :tee 10-11-2018 epic Cardiac Cath : 09-15-2018 epic Sleep Study/ CPAP :none Fasting Blood Sugar :      / Checks Blood Sugar -- times a day: n/a  Blood Thinner/ Instructions /Last Dose: instructed by coumadin clinic to stop coumadin 5 days prior to surgery and has lovenox bridge starting  03-31-2019 per patient ASA / Instructions/ Last Dose : instructwed by coumadin clinic to stop aspirin 7 days prior to surgery per patient  Patient denies shortness of breath, chest pain, fever, and cough at this phone interview.

## 2019-03-28 NOTE — Progress Notes (Signed)
Chart reviewed by anesthesia, Konrad Felix PA,  Ok to proceed barring acute status change.

## 2019-03-28 NOTE — Progress Notes (Signed)
Anesthesia Chart Review   Case: [id number] Date/Time: 04/04/19 0915   Procedure: RADIOACTIVE SEED IMPLANT/BRACHYTHERAPY IMPLANT, cystoscopy (N/A )   Anesthesia type: General   Pre-op diagnosis: PROSTATE CANCER   Location: [facility] OR ROOM 1 / [place]   Surgeons: Ceasar Mons, MD      DISCUSSION:68 y.o. former smoker (8.75 pack years, quit 09/04/2018) CAD, s/p CABG, AV replacement, mitral valve replacment, patent foramen ovale closure, pacemaker 10/11/2018, last pacemaker check 02/23/19, GERD, prostate cancer scheduled for above procedure 04/04/19 with Dr. Harrell Gave Lovena Neighbours.   Pt cleared by cardiology 02/14/19.  Per Roby Lofts, PA-C, "Per Dr. Angelena Form, patient is at acceptable risk for the planned procedure without further cardiovascular testing. He can hold his aspirin 7 days prior to his procedure. Pharmacy recommends holding coumadin 5 days prior to his procedure. He is recommended for lovenox bridging in the perioperative setting which will be coordinated by the coumadin clinic. He should restart aspirin and coumadin when cleared to do so by his surgeon."  .discussed with Dr. Gifford Shave.  Anticipate pt can proceed with planned procedure barring acute status change and after evaluation DOS.  VS: There were no vitals taken for this visit.  PROVIDERS: Denita Lung, MD is PCP   Lauree Chandler, MD is Cardiologist  LABS: DOS (all labs ordered are listed, but only abnormal results are displayed)  Labs Reviewed - No data to display   IMAGES:   EKG: 03/08/19 Rate 93 bpm  Atrial-sensed ventricular paced rhythm   CV:  Echo 06/27/2018 IMPRESSIONS   1. The left ventricle has hyperdynamic systolic function, with an  ejection fraction of >65%. The cavity size was normal. There is moderately  increased left ventricular wall thickness. Left ventricular diastolic  Doppler parameters are indeterminate.  2. The right ventricle has normal systolic function. The  cavity was  normal.  3. Left atrial size was severely dilated.  4. The mitral valve is abnormal. There is severe mitral annular  calcification present. Severe mitral valve stenosis.  5. The tricuspid valve is grossly normal.  6. There is mild dilatation of the ascending aorta measuring 38 mm.  7. Vigorous LV systolic function; moderate LVH; mildly dilated ascending  aorta; heavily calcified aortic valve with severe AS (mean gradient 45  mmHg) and at least moderate AI (difficult to quantitate due to  contamination from mitral inflow); severe MAC  with severe MS (mean gradient 13 mmHg; MVA by pressure halftime 1.1 cm2);  severe LAE.  Past Medical History:  Diagnosis Date  . BCE (basal cell epithelioma), arm    RIGHT SHOULDER  . Bradycardia   . Bumps on skin    on left side of nose for last 3 weeks  . CAD (coronary artery disease)    a. s/p CABGx2 10/11/18 LIMA to LAD, SVG to RCA, EVH via right thigh.  . Dyslipidemia   . Ectatic thoracic aorta ([place])   . Esophageal stricture   . GERD (gastroesophageal reflux disease)   . Heart murmur    sees dr Clydene Fake  . Hyperlipidemia   . Incidental pulmonary nodule, > 46m and < 855m8/26/2020   Noted on CTA  . Inguinal hernia    bilateral  . Obesity   . Presence of permanent cardiac pacemaker   . Prostate cancer ([place]  . S/P aortic valve replacement with bioprosthetic valve 10/11/2018   23 mm Edwards Inspiris Resilia stented bovine pericardial tissue valve, miltral valve done also  . S/P CABG x 2  10/11/2018   LIMA to LAD, SVG to RCA, EVH via right thigh  . S/P mitral valve replacement with bioprosthetic valve 10/11/2018   27 mm Medtronic Mosaic stented porcine bioprosthetic tissue valve  . S/P patent foramen ovale closure 10/11/2018  . S/P placement of cardiac pacemaker   . Smoker     Past Surgical History:  Procedure Laterality Date  . AORTIC VALVE REPLACEMENT N/A 10/11/2018   Procedure: AORTIC VALVE REPLACEMENT (AVR) with 23 Inspiris  Bioprosthetic Aortic valve.;  Surgeon: Rexene Alberts, MD;  Location: [place];  Service: Open Heart Surgery;  Laterality: N/A;  . CLIPPING OF ATRIAL APPENDAGE N/A 10/11/2018   Procedure: CLIPPING OF LEFT ATRIAL APPENDAGE with 45 AtriCure Clip.;  Surgeon: Rexene Alberts, MD;  Location: [place];  Service: Open Heart Surgery;  Laterality: N/A;  . COLONOSCOPY  03/2008,2004   Dr. Amedeo Plenty  . CORONARY ARTERY BYPASS GRAFT N/A 10/11/2018   Procedure: CORONARY ARTERY BYPASS GRAFTING (CABG) x 2 using LIMA to the LAD and endoscopic greater saphenous vein harvesting to the RCA.;  Surgeon: Rexene Alberts, MD;  Location: [place];  Service: Open Heart Surgery;  Laterality: N/A;  . HERNIA REPAIR  12/11/10   BIH  . MITRAL VALVE REPLACEMENT N/A 10/11/2018   Procedure: MITRAL VALVE (MV) REPLACEMENT with 27 Mosaic Bioprosthetic Mitral valve.;  Surgeon: Rexene Alberts, MD;  Location: [place];  Service: Open Heart Surgery;  Laterality: N/A;  . PACEMAKER IMPLANT N/A 10/17/2018   Procedure: PACEMAKER IMPLANT;  Surgeon: Thompson Grayer, MD;  Location: [place] [facility];  Service: Cardiovascular;  Laterality: N/A;  . REPAIR OF PATENT FORAMEN OVALE N/A 10/11/2018   Procedure: CLOSURE OF PATENT FORAMEN OVALE;  Surgeon: Rexene Alberts, MD;  Location: [place];  Service: Open Heart Surgery;  Laterality: N/A;  . RIGHT/LEFT HEART CATH AND CORONARY ANGIOGRAPHY N/A 09/15/2018   Procedure: RIGHT/LEFT HEART CATH AND CORONARY ANGIOGRAPHY;  Surgeon: Burnell Blanks, MD;  Location: [place] [facility];  Service: Cardiovascular;  Laterality: N/A;  . TEE WITHOUT CARDIOVERSION N/A 12/12/2015   Procedure: TRANSESOPHAGEAL ECHOCARDIOGRAM (TEE);  Surgeon: Sanda Klein, MD;  Location: [place];  Service: Cardiovascular;  Laterality: N/A;  . TEE WITHOUT CARDIOVERSION N/A 10/11/2018   Procedure: TRANSESOPHAGEAL ECHOCARDIOGRAM (TEE);  Surgeon: Rexene Alberts, MD;  Location: [place];  Service: Open Heart Surgery;  Laterality: N/A;  . THYMECTOMY  1976    radaiation tx done    MEDICATIONS: . aspirin 81 MG tablet  . atorvastatin (LIPITOR) 20 MG tablet  . calcium carbonate (TUMS - DOSED IN MG ELEMENTAL CALCIUM) 500 MG chewable tablet  . enoxaparin (LOVENOX) 100 MG/ML injection  . metoprolol tartrate (LOPRESSOR) 25 MG tablet  . oxymetazoline (AFRIN) 0.05 % nasal spray  . warfarin (COUMADIN) 2.5 MG tablet   No current facility-administered medications for this encounter.     Maia Plan [facility] Pre-Surgical Testing [phone] 03/28/19  3:46 PM

## 2019-03-28 NOTE — Anesthesia Preprocedure Evaluation (Addendum)
Anesthesia Evaluation  Patient identified by MRN, date of birth, ID band Patient awake    Reviewed: Allergy & Precautions, NPO status , Patient's Chart, lab work & pertinent test results  Airway Mallampati: II  TM Distance: >3 FB Neck ROM: Full    Dental no notable dental hx. (+) Teeth Intact, Dental Advisory Given   Pulmonary former smoker,  8.75 pack years, quit 09/04/2018   Pulmonary exam normal breath sounds clear to auscultation       Cardiovascular + CAD  Normal cardiovascular exam+ dysrhythmias + pacemaker + Valvular Problems/Murmurs  Rhythm:Regular Rate:Normal  s/p CABGx2 AVR and MVR, patent foramen ovale closure, pacemaker 10/11/2018  last pacemaker check 02/23/19  HLD  A/C: bridging coumadin/lovenox   Neuro/Psych negative neurological ROS  negative psych ROS   GI/Hepatic Neg liver ROS, GERD  ,Hx esophageal stricture   Endo/Other  negative endocrine ROS  Renal/GU negative Renal ROS   Prostate ca    Musculoskeletal negative musculoskeletal ROS (+)   Abdominal   Peds  Hematology negative hematology ROS (+)   Anesthesia Other Findings   Reproductive/Obstetrics negative OB ROS                                                            Anesthesia Evaluation  Patient identified by MRN, date of birth, ID band Patient awake    Reviewed: Allergy & Precautions, NPO status , Patient's Chart, lab work & pertinent test results  Airway Mallampati: II  TM Distance: >3 FB     Dental   Pulmonary former smoker,    breath sounds clear to auscultation       Cardiovascular + CAD  + Valvular Problems/Murmurs  Rhythm:Regular Rate:Normal + Systolic murmurs    Neuro/Psych    GI/Hepatic negative GI ROS, Neg liver ROS,   Endo/Other  negative endocrine ROS  Renal/GU negative Renal ROS     Musculoskeletal   Abdominal   Peds  Hematology   Anesthesia Other  Findings   Reproductive/Obstetrics                            Anesthesia Physical Anesthesia Plan  ASA: IV  Anesthesia Plan: General   Post-op Pain Management:    Induction: Intravenous  PONV Risk Score and Plan: 2 and Midazolam and Treatment may vary due to age or medical condition  Airway Management Planned: Oral ETT  Additional Equipment:   Intra-op Plan:   Post-operative Plan: Post-operative intubation/ventilation  Informed Consent: I have reviewed the patients History and Physical, chart, labs and discussed the procedure including the risks, benefits and alternatives for the proposed anesthesia with the patient or authorized representative who has indicated his/her understanding and acceptance.     Dental advisory given  Plan Discussed with: Anesthesiologist, CRNA and Surgeon  Anesthesia Plan Comments:        Anesthesia Quick Evaluation  Anesthesia Physical Anesthesia Plan  ASA: III  Anesthesia Plan: General   Post-op Pain Management:    Induction: Intravenous  PONV Risk Score and Plan: 2 and Ondansetron, Dexamethasone and Treatment may vary due to age or medical condition  Airway Management Planned: LMA  Additional Equipment: None  Intra-op Plan:   Post-operative Plan: Extubation in OR  Informed Consent: I have reviewed the  patients History and Physical, chart, labs and discussed the procedure including the risks, benefits and alternatives for the proposed anesthesia with the patient or authorized representative who has indicated his/her understanding and acceptance.     Dental advisory given  Plan Discussed with: CRNA  Anesthesia Plan Comments:        Anesthesia Quick Evaluation

## 2019-03-29 ENCOUNTER — Telehealth: Payer: Self-pay | Admitting: Cardiovascular Disease

## 2019-03-29 NOTE — Telephone Encounter (Signed)
Patient calling to get clear instructions on when to stop coumadin and start lovenox.

## 2019-03-29 NOTE — Telephone Encounter (Signed)
Returned call to the pt; pt had his detailed instructions in front of him and he wanted to ensure he was following it correctly. Went over day by with the pt and he verbalized understanding. Pt was unsure if he was supposed to do both, Lovenox and Warfarin, and explained to him the reason why after the procedure he does both, as the Lovenox will protect him from obtaining a clot/having stroke while the Warfarin is building back up in his system and he verbalized understanding. Pt was grateful for the return call and did not have anymore questions.

## 2019-04-02 ENCOUNTER — Other Ambulatory Visit (HOSPITAL_COMMUNITY)
Admission: RE | Admit: 2019-04-02 | Discharge: 2019-04-02 | Disposition: A | Discharge status: Home / Self Care | Payer: Medicare HMO | Source: Ambulatory Visit | Attending: Urology | Admitting: Urology

## 2019-04-02 ENCOUNTER — Encounter (HOSPITAL_COMMUNITY)
Admission: RE | Admit: 2019-04-02 | Discharge: 2019-04-02 | Disposition: A | Discharge status: Home / Self Care | Payer: Medicare HMO | Source: Ambulatory Visit | Attending: Urology | Admitting: Urology

## 2019-04-02 ENCOUNTER — Encounter: Payer: Self-pay | Admitting: Medical Oncology

## 2019-04-02 ENCOUNTER — Other Ambulatory Visit: Payer: Self-pay

## 2019-04-02 DIAGNOSIS — Z01812 Encounter for preprocedural laboratory examination: Secondary | ICD-10-CM | POA: Insufficient documentation

## 2019-04-02 DIAGNOSIS — Z20822 Contact with and (suspected) exposure to covid-19: Secondary | ICD-10-CM | POA: Diagnosis not present

## 2019-04-02 LAB — PROTIME-INR
INR: 1.3 — ABNORMAL HIGH (ref 0.8–1.2)
Prothrombin Time: 16 seconds — ABNORMAL HIGH (ref 11.4–15.2)

## 2019-04-02 LAB — COMPREHENSIVE METABOLIC PANEL
ALT: 22 U/L (ref 0–44)
AST: 24 U/L (ref 15–41)
Albumin: 3.8 g/dL (ref 3.5–5.0)
Alkaline Phosphatase: 60 U/L (ref 38–126)
Anion gap: 9 (ref 5–15)
BUN: 17 mg/dL (ref 8–23)
CO2: 23 mmol/L (ref 22–32)
Calcium: 9.2 mg/dL (ref 8.9–10.3)
Chloride: 107 mmol/L (ref 98–111)
Creatinine, Ser: 0.83 mg/dL (ref 0.61–1.24)
GFR calc Af Amer: >60 mL/min (ref >60)
GFR calc non Af Amer: >60 mL/min (ref >60)
Glucose, Bld: 111 mg/dL — ABNORMAL HIGH (ref 70–99)
Potassium: 3.8 mmol/L (ref 3.5–5.1)
Sodium: 139 mmol/L (ref 135–145)
Total Bilirubin: 0.6 mg/dL (ref 0.3–1.2)
Total Protein: 7 g/dL (ref 6.5–8.1)

## 2019-04-02 LAB — CBC
HCT: 43.2 % (ref 39.0–52.0)
Hemoglobin: 13.9 g/dL (ref 13.0–17.0)
MCH: 26.1 pg (ref 26.0–34.0)
MCHC: 32.2 g/dL (ref 30.0–36.0)
MCV: 81.2 fL (ref 80.0–100.0)
Platelets: 158 10*3/uL (ref 150–400)
RBC: 5.32 MIL/uL (ref 4.22–5.81)
RDW: 19.9 % — ABNORMAL HIGH (ref 11.5–15.5)
WBC: 5.7 10*3/uL (ref 4.0–10.5)
nRBC: 0 % (ref 0.0–0.2)

## 2019-04-02 LAB — SARS CORONAVIRUS 2 (TAT 6-24 HRS): SARS Coronavirus 2: NEGATIVE

## 2019-04-02 LAB — APTT: aPTT: 42 seconds — ABNORMAL HIGH (ref 24–36)

## 2019-04-03 ENCOUNTER — Telehealth: Payer: Self-pay | Admitting: *Deleted

## 2019-04-03 ENCOUNTER — Encounter (HOSPITAL_BASED_OUTPATIENT_CLINIC_OR_DEPARTMENT_OTHER): Payer: Self-pay | Admitting: Urology

## 2019-04-03 NOTE — Telephone Encounter (Signed)
CALLED PATIENT TO REMIND OF PROCEDURE FOR 04-04-19, LVM FOR A RETURN CALL

## 2019-04-04 ENCOUNTER — Ambulatory Visit (HOSPITAL_BASED_OUTPATIENT_CLINIC_OR_DEPARTMENT_OTHER): Payer: Medicare HMO | Admitting: Anesthesiology

## 2019-04-04 ENCOUNTER — Ambulatory Visit (HOSPITAL_BASED_OUTPATIENT_CLINIC_OR_DEPARTMENT_OTHER): Payer: Medicare HMO | Admitting: Physician Assistant

## 2019-04-04 ENCOUNTER — Ambulatory Visit (HOSPITAL_BASED_OUTPATIENT_CLINIC_OR_DEPARTMENT_OTHER)
Admission: RE | Admit: 2019-04-04 | Discharge: 2019-04-04 | Disposition: A | Discharge status: Home / Self Care | Payer: Medicare HMO | Attending: Urology | Admitting: Urology

## 2019-04-04 ENCOUNTER — Encounter (HOSPITAL_BASED_OUTPATIENT_CLINIC_OR_DEPARTMENT_OTHER): Payer: Self-pay | Admitting: Urology

## 2019-04-04 ENCOUNTER — Encounter (HOSPITAL_BASED_OUTPATIENT_CLINIC_OR_DEPARTMENT_OTHER)
Admission: RE | Disposition: A | Discharge status: Home / Self Care | Payer: Self-pay | Source: Home / Self Care | Attending: Urology

## 2019-04-04 ENCOUNTER — Ambulatory Visit (HOSPITAL_COMMUNITY): Payer: Medicare HMO

## 2019-04-04 DIAGNOSIS — I2510 Atherosclerotic heart disease of native coronary artery without angina pectoris: Secondary | ICD-10-CM | POA: Diagnosis not present

## 2019-04-04 DIAGNOSIS — Z950 Presence of cardiac pacemaker: Secondary | ICD-10-CM | POA: Insufficient documentation

## 2019-04-04 DIAGNOSIS — Z8774 Personal history of (corrected) congenital malformations of heart and circulatory system: Secondary | ICD-10-CM | POA: Diagnosis not present

## 2019-04-04 DIAGNOSIS — Z6834 Body mass index (BMI) 34.0-34.9, adult: Secondary | ICD-10-CM | POA: Insufficient documentation

## 2019-04-04 DIAGNOSIS — Z951 Presence of aortocoronary bypass graft: Secondary | ICD-10-CM | POA: Diagnosis not present

## 2019-04-04 DIAGNOSIS — Z886 Allergy status to analgesic agent status: Secondary | ICD-10-CM | POA: Diagnosis not present

## 2019-04-04 DIAGNOSIS — Z20822 Contact with and (suspected) exposure to covid-19: Secondary | ICD-10-CM | POA: Diagnosis not present

## 2019-04-04 DIAGNOSIS — F17211 Nicotine dependence, cigarettes, in remission: Secondary | ICD-10-CM | POA: Diagnosis not present

## 2019-04-04 DIAGNOSIS — E669 Obesity, unspecified: Secondary | ICD-10-CM | POA: Diagnosis not present

## 2019-04-04 DIAGNOSIS — C61 Malignant neoplasm of prostate: Secondary | ICD-10-CM | POA: Insufficient documentation

## 2019-04-04 DIAGNOSIS — K219 Gastro-esophageal reflux disease without esophagitis: Secondary | ICD-10-CM | POA: Diagnosis not present

## 2019-04-04 DIAGNOSIS — Z952 Presence of prosthetic heart valve: Secondary | ICD-10-CM | POA: Insufficient documentation

## 2019-04-04 DIAGNOSIS — E785 Hyperlipidemia, unspecified: Secondary | ICD-10-CM | POA: Diagnosis not present

## 2019-04-04 HISTORY — PX: CYSTOSCOPY: SHX5120

## 2019-04-04 HISTORY — DX: Cardiac murmur, unspecified: R01.1

## 2019-04-04 HISTORY — DX: Gastro-esophageal reflux disease without esophagitis: K21.9

## 2019-04-04 HISTORY — DX: Presence of cardiac pacemaker: Z95.0

## 2019-04-04 HISTORY — PX: RADIOACTIVE SEED IMPLANT: SHX5150

## 2019-04-04 HISTORY — DX: Disorder of the skin and subcutaneous tissue, unspecified: L98.9

## 2019-04-04 SURGERY — INSERTION, RADIATION SOURCE, PROSTATE
Anesthesia: General | Site: Prostate

## 2019-04-04 MED ORDER — LACTATED RINGERS IV SOLN
INTRAVENOUS | Status: DC
  Filled 2019-04-04: qty 1000

## 2019-04-04 MED ORDER — FENTANYL CITRATE (PF) 100 MCG/2ML IJ SOLN
INTRAMUSCULAR | Status: AC
  Filled 2019-04-04: qty 2

## 2019-04-04 MED ORDER — LIDOCAINE HCL 1 % IJ SOLN
INTRAMUSCULAR | Status: DC | PRN
  Administered 2019-04-04: 40 mg via INTRADERMAL
  Administered 2019-04-04: 60 mg via INTRADERMAL

## 2019-04-04 MED ORDER — ONDANSETRON HCL 4 MG/2ML IJ SOLN
4.0000 mg | Freq: Once | INTRAMUSCULAR | Status: DC | PRN
  Filled 2019-04-04: qty 2

## 2019-04-04 MED ORDER — MIDAZOLAM HCL 5 MG/5ML IJ SOLN
INTRAMUSCULAR | Status: DC | PRN
  Administered 2019-04-04: 2 mg via INTRAVENOUS

## 2019-04-04 MED ORDER — PHENYLEPHRINE HCL (PRESSORS) 10 MG/ML IV SOLN
INTRAVENOUS | Status: DC | PRN
  Administered 2019-04-04: 40 ug via INTRAVENOUS
  Administered 2019-04-04: 80 ug via INTRAVENOUS

## 2019-04-04 MED ORDER — PROPOFOL 10 MG/ML IV BOLUS
INTRAVENOUS | Status: AC
  Filled 2019-04-04: qty 20

## 2019-04-04 MED ORDER — FENTANYL CITRATE (PF) 100 MCG/2ML IJ SOLN
25.0000 ug | INTRAMUSCULAR | Status: DC | PRN
  Filled 2019-04-04: qty 1

## 2019-04-04 MED ORDER — DEXAMETHASONE SODIUM PHOSPHATE 10 MG/ML IJ SOLN
INTRAMUSCULAR | Status: AC
  Filled 2019-04-04: qty 1

## 2019-04-04 MED ORDER — IOHEXOL 300 MG/ML  SOLN
INTRAMUSCULAR | Status: DC | PRN
  Administered 2019-04-04: 10:00:00 7 mL

## 2019-04-04 MED ORDER — ONDANSETRON HCL 4 MG/2ML IJ SOLN
INTRAMUSCULAR | Status: AC
  Filled 2019-04-04: qty 2

## 2019-04-04 MED ORDER — DEXAMETHASONE SODIUM PHOSPHATE 10 MG/ML IJ SOLN
INTRAMUSCULAR | Status: DC | PRN
  Administered 2019-04-04: 10 mg via INTRAVENOUS

## 2019-04-04 MED ORDER — FLEET ENEMA 7-19 GM/118ML RE ENEM
1.0000 | ENEMA | Freq: Once | RECTAL | Status: DC
  Filled 2019-04-04: qty 1

## 2019-04-04 MED ORDER — FENTANYL CITRATE (PF) 100 MCG/2ML IJ SOLN
INTRAMUSCULAR | Status: DC | PRN
  Administered 2019-04-04 (×2): 25 ug via INTRAVENOUS
  Administered 2019-04-04 (×2): 50 ug via INTRAVENOUS

## 2019-04-04 MED ORDER — PHENYLEPHRINE 40 MCG/ML (10ML) SYRINGE FOR IV PUSH (FOR BLOOD PRESSURE SUPPORT)
PREFILLED_SYRINGE | INTRAVENOUS | Status: AC
  Filled 2019-04-04: qty 10

## 2019-04-04 MED ORDER — SODIUM CHLORIDE (PF) 0.9 % IJ SOLN
INTRAMUSCULAR | Status: DC | PRN
  Administered 2019-04-04: 10 mL

## 2019-04-04 MED ORDER — PROPOFOL 10 MG/ML IV BOLUS
INTRAVENOUS | Status: DC | PRN
  Administered 2019-04-04: 50 mg via INTRAVENOUS
  Administered 2019-04-04: 150 mg via INTRAVENOUS

## 2019-04-04 MED ORDER — TRAMADOL HCL 50 MG PO TABS: 50.0000 mg | ORAL_TABLET | Freq: Four times a day (QID) | ORAL | 0 refills | Status: AC | PRN

## 2019-04-04 MED ORDER — CIPROFLOXACIN IN D5W 400 MG/200ML IV SOLN
400.0000 mg | INTRAVENOUS | Status: AC
  Administered 2019-04-04: 10:00:00 400 mg via INTRAVENOUS
  Filled 2019-04-04: qty 200

## 2019-04-04 MED ORDER — ONDANSETRON HCL 4 MG/2ML IJ SOLN
INTRAMUSCULAR | Status: DC | PRN
  Administered 2019-04-04: 4 mg via INTRAVENOUS

## 2019-04-04 MED ORDER — MIDAZOLAM HCL 2 MG/2ML IJ SOLN
INTRAMUSCULAR | Status: AC
  Filled 2019-04-04: qty 2

## 2019-04-04 MED ORDER — LIDOCAINE 2% (20 MG/ML) 5 ML SYRINGE
INTRAMUSCULAR | Status: AC
  Filled 2019-04-04: qty 5

## 2019-04-04 MED ORDER — CIPROFLOXACIN IN D5W 400 MG/200ML IV SOLN
INTRAVENOUS | Status: AC
  Filled 2019-04-04: qty 200

## 2019-04-04 MED ORDER — SODIUM CHLORIDE 0.9 % IR SOLN
Status: DC | PRN
  Administered 2019-04-04: 1000 mL via INTRAVESICAL

## 2019-04-04 SURGICAL SUPPLY — 32 items
BAG URINE DRAIN 2000ML AR STRL (UROLOGICAL SUPPLIES) ×4 IMPLANT
BLADE CLIPPER SENSICLIP SURGIC (BLADE) ×4 IMPLANT
CATH FOLEY 2WAY SLVR  5CC 16FR (CATHETERS) ×2
CATH FOLEY 2WAY SLVR 5CC 16FR (CATHETERS) ×2 IMPLANT
CATH ROBINSON RED A/P 16FR (CATHETERS) IMPLANT
CATH ROBINSON RED A/P 20FR (CATHETERS) ×4 IMPLANT
CLOTH BEACON ORANGE TIMEOUT ST (SAFETY) ×4 IMPLANT
CONT SPEC 4OZ CLIKSEAL STRL BL (MISCELLANEOUS) ×8 IMPLANT
COVER BACK TABLE 60X90IN (DRAPES) ×4 IMPLANT
COVER MAYO STAND STRL (DRAPES) ×4 IMPLANT
DRSG TEGADERM 4X4.75 (GAUZE/BANDAGES/DRESSINGS) ×4 IMPLANT
DRSG TEGADERM 8X12 (GAUZE/BANDAGES/DRESSINGS) ×4 IMPLANT
GAUZE SPONGE 4X4 12PLY STRL (GAUZE/BANDAGES/DRESSINGS) ×2 IMPLANT
GLOVE BIO SURGEON STRL SZ7.5 (GLOVE) ×4 IMPLANT
GLOVE BIO SURGEON STRL SZ8 (GLOVE) ×2 IMPLANT
GLOVE SURG ORTHO 8.5 STRL (GLOVE) IMPLANT
GLOVE SURG SS PI 6.5 STRL IVOR (GLOVE) IMPLANT
GOWN STRL REUS W/TWL LRG LVL3 (GOWN DISPOSABLE) ×4 IMPLANT
HOLDER FOLEY CATH W/STRAP (MISCELLANEOUS) IMPLANT
I seed Agx 100 ×116 IMPLANT
IMPL SPACEOAR SYSTEM 10ML (Spacer) IMPLANT
IMPLANT SPACEOAR SYSTEM 10ML (Spacer) ×4 IMPLANT
IV NS 1000ML (IV SOLUTION) ×2
IV NS 1000ML BAXH (IV SOLUTION) ×2 IMPLANT
KIT TURNOVER CYSTO (KITS) ×4 IMPLANT
MARKER SKIN DUAL TIP RULER LAB (MISCELLANEOUS) ×4 IMPLANT
PACK CYSTO (CUSTOM PROCEDURE TRAY) ×4 IMPLANT
SUT BONE WAX W31G (SUTURE) IMPLANT
SYR 10ML LL (SYRINGE) ×4 IMPLANT
TOWEL OR 17X26 10 PK STRL BLUE (TOWEL DISPOSABLE) ×4 IMPLANT
UNDERPAD 30X30 (UNDERPADS AND DIAPERS) ×8 IMPLANT
WATER STERILE IRR 500ML POUR (IV SOLUTION) ×4 IMPLANT

## 2019-04-04 NOTE — Anesthesia Postprocedure Evaluation (Signed)
Anesthesia Post Note  Patient: [name]  Procedure(s) Performed: RADIOACTIVE SEED IMPLANT/BRACHYTHERAPY IMPLANT, cystoscopy (N/A Prostate) CYSTOSCOPY (Bladder)     Patient location during evaluation: PACU Anesthesia Type: General Level of consciousness: awake and alert, oriented and patient cooperative Pain management: pain level controlled Vital Signs Assessment: post-procedure vital signs reviewed and stable Respiratory status: spontaneous breathing, nonlabored ventilation and respiratory function stable Cardiovascular status: blood pressure returned to baseline and stable Postop Assessment: no apparent nausea or vomiting Anesthetic complications: no    Last Vitals:  Vitals:   04/04/19 1100 04/04/19 1115  BP: (!) 133/91 134/90  Pulse: 87 85  Resp: 13 (!) 21  Temp:    SpO2: 99% 100%    Last Pain:  Vitals:   04/04/19 1115  TempSrc:   PainSc: 0-No pain                 [name]

## 2019-04-04 NOTE — H&P (Signed)
Urology Preoperative H&P   Chief Complaint: Prostate cancer  History of Present Illness: [name] is a 69 y.o. male with T1c, Gleason 3+4=7 prostate cancer that was diagnosed in July of 2020.  He is here today for brachytherapy seed and SpaceOAR placement for primary treatment of his intermediate risk prostate cancer.   Last PSA: 5.78 (02/2019), 5.64 (07/2018), 5.3 (04/2018), 4.7 (2019). No personal/family history of prostate cancer. He denies prior prostate biopsies.  Biopsy Date: 08/10/18  TNM stage: T1c  Gleason score: 3+4  Left: NED  Right: G3+4 RLA (5%) in one core  Prostate volume: 36 cm^3   He is voiding without difficulty and denies interval UTIs, dysuria or hematuria.   PMHx: aortic and mitral valve stenosis--he is having both valves replaced by Dr. Julianne Handler this fall  -[facility] in 2015--had left testicular "swelling" soon after surgery, but denies severe/intense pain. He says that both testicles were normal prior to the procedure.   SHIM- 20  AUASS-7/2      Past Medical History:  Diagnosis Date  . BCE (basal cell epithelioma), arm    RIGHT SHOULDER  . Bradycardia   . Bumps on skin    on left side of nose for last 3 weeks  . CAD (coronary artery disease)    a. s/p CABGx2 10/11/18 LIMA to LAD, SVG to RCA, EVH via right thigh.  . Dyslipidemia   . Ectatic thoracic aorta ([place])   . Esophageal stricture   . GERD (gastroesophageal reflux disease)   . Heart murmur    sees dr Clydene Fake  . Hyperlipidemia   . Incidental pulmonary nodule, > 52mm and < 25mm 10/04/2018   Noted on CTA  . Inguinal hernia    bilateral  . Obesity   . Presence of permanent cardiac pacemaker   . Prostate cancer ([place])   . S/P aortic valve replacement with bioprosthetic valve 10/11/2018   23 mm Edwards Inspiris Resilia stented bovine pericardial tissue valve, miltral valve done also  . S/P CABG x 2 10/11/2018   LIMA to LAD, SVG to RCA, EVH via right thigh  . S/P mitral valve replacement with  bioprosthetic valve 10/11/2018   27 mm Medtronic Mosaic stented porcine bioprosthetic tissue valve  . S/P patent foramen ovale closure 10/11/2018  . S/P placement of cardiac pacemaker   . Smoker     Past Surgical History:  Procedure Laterality Date  . AORTIC VALVE REPLACEMENT N/A 10/11/2018   Procedure: AORTIC VALVE REPLACEMENT (AVR) with 23 Inspiris Bioprosthetic Aortic valve.;  Surgeon: Rexene Alberts, MD;  Location: [place];  Service: Open Heart Surgery;  Laterality: N/A;  . CLIPPING OF ATRIAL APPENDAGE N/A 10/11/2018   Procedure: CLIPPING OF LEFT ATRIAL APPENDAGE with 45 AtriCure Clip.;  Surgeon: Rexene Alberts, MD;  Location: [place];  Service: Open Heart Surgery;  Laterality: N/A;  . COLONOSCOPY  03/2008,2004   Dr. Amedeo Plenty  . CORONARY ARTERY BYPASS GRAFT N/A 10/11/2018   Procedure: CORONARY ARTERY BYPASS GRAFTING (CABG) x 2 using LIMA to the LAD and endoscopic greater saphenous vein harvesting to the RCA.;  Surgeon: Rexene Alberts, MD;  Location: [place];  Service: Open Heart Surgery;  Laterality: N/A;  . HERNIA REPAIR  12/11/10   [facility]  . MITRAL VALVE REPLACEMENT N/A 10/11/2018   Procedure: MITRAL VALVE (MV) REPLACEMENT with 27 Mosaic Bioprosthetic Mitral valve.;  Surgeon: Rexene Alberts, MD;  Location: [place];  Service: Open Heart Surgery;  Laterality: N/A;  . PACEMAKER IMPLANT N/A 10/17/2018  Procedure: PACEMAKER IMPLANT;  Surgeon: Thompson Grayer, MD;  Location: [facility];  Service: Cardiovascular;  Laterality: N/A;  . REPAIR OF PATENT FORAMEN OVALE N/A 10/11/2018   Procedure: CLOSURE OF PATENT FORAMEN OVALE;  Surgeon: Rexene Alberts, MD;  Location: [place];  Service: Open Heart Surgery;  Laterality: N/A;  . RIGHT/LEFT HEART CATH AND CORONARY ANGIOGRAPHY N/A 09/15/2018   Procedure: RIGHT/LEFT HEART CATH AND CORONARY ANGIOGRAPHY;  Surgeon: Burnell Blanks, MD;  Location: [facility];  Service: Cardiovascular;  Laterality: N/A;  . TEE WITHOUT CARDIOVERSION N/A 12/12/2015    Procedure: TRANSESOPHAGEAL ECHOCARDIOGRAM (TEE);  Surgeon: Sanda Klein, MD;  Location: [place];  Service: Cardiovascular;  Laterality: N/A;  . TEE WITHOUT CARDIOVERSION N/A 10/11/2018   Procedure: TRANSESOPHAGEAL ECHOCARDIOGRAM (TEE);  Surgeon: Rexene Alberts, MD;  Location: [place];  Service: Open Heart Surgery;  Laterality: N/A;  . THYMECTOMY  1976   radaiation tx done    Allergies:  Allergies  Allergen Reactions  . Tylenol [Acetaminophen] Nausea Only    Family History  Problem Relation Age of Onset  . Colon cancer Mother   . Lung cancer Father        smoker  . CAD Neg Hx   . Pancreatic cancer Neg Hx   . Breast cancer Neg Hx     Social History:  reports that he quit smoking about 6 months ago. His smoking use included cigarettes. He has a 8.75 pack-year smoking history. He has never used smokeless tobacco. He reports current alcohol use of about 1.0 standard drinks of alcohol per week. He reports that he does not use drugs.  ROS: A complete review of systems was performed.  All systems are negative except for pertinent findings as noted.  Physical Exam:  Vital signs in last 24 hours: Temp:  [97.5 F (36.4 C)] 97.5 F (36.4 C) (02/24 0823) Pulse Rate:  [92] 92 (02/24 0823) Resp:  [16] 16 (02/24 0823) BP: (135)/(94) 135/94 (02/24 0823) SpO2:  [96 %] 96 % (02/24 0823) Weight:  [105.4 kg] 105.4 kg (02/24 0823) Constitutional:  Alert and oriented, No acute distress Cardiovascular: Regular rate and rhythm, No JVD Respiratory: Normal respiratory effort, Lungs clear bilaterally GI: Abdomen is soft, nontender, nondistended, no abdominal masses GU: No CVA tenderness Lymphatic: No lymphadenopathy Neurologic: Grossly intact, no focal deficits Psychiatric: Normal mood and affect  Laboratory Data:  Recent Labs    04/02/19 0901  WBC 5.7  HGB 13.9  HCT 43.2  PLT 158    Recent Labs    04/02/19 0901  NA 139  K 3.8  CL 107  GLUCOSE 111*  BUN 17  CALCIUM 9.2   CREATININE 0.83     No results found for this or any previous visit (from the past 24 hour(s)). Recent Results (from the past 240 hour(s))  SARS CORONAVIRUS 2 (TAT 6-24 HRS) Nasopharyngeal Nasopharyngeal Swab     Status: None   Collection Time: 04/02/19  9:48 AM   Specimen: Nasopharyngeal Swab  Result Value Ref Range Status   SARS Coronavirus 2 NEGATIVE NEGATIVE Final    Comment: (NOTE) SARS-CoV-2 target nucleic acids are NOT DETECTED. The SARS-CoV-2 RNA is generally detectable in upper and lower respiratory specimens during the acute phase of infection. Negative results do not preclude SARS-CoV-2 infection, do not rule out co-infections with other pathogens, and should not be used as the sole basis for treatment or other patient management decisions. Negative results must be combined with clinical observations, patient  history, and epidemiological information. The expected result is Negative. Fact Sheet for Patients: SugarRoll.be Fact Sheet for Healthcare Providers: https://www.woods-mathews.com/ This test is not yet approved or cleared by the [place] FDA and  has been authorized for detection and/or diagnosis of SARS-CoV-2 by FDA under an Emergency Use Authorization (EUA). This EUA will remain  in effect (meaning this test can be used) for the duration of the COVID-19 declaration under Section 56 4(b)(1) of the Act, 21 U.S.C. section 360bbb-3(b)(1), unless the authorization is terminated or revoked sooner. Performed at [facility], [place] [address] [place], [place] [address]     Renal Function: Recent Labs    04/02/19 0901  CREATININE 0.83   Estimated Creatinine Clearance: 101.9 mL/min (by C-G formula based on SCr of 0.83 mg/dL).  Radiologic Imaging: No results found.  I independently reviewed the above imaging studies.  Assessment and Plan KORTNEY CANTERA is a 69 y.o. male with Gleason 3+4= 7 prostate  cancer  -The patient was counseled about the natural history of prostate cancer and the standard treatment options that are available for prostate cancer. It was explained to him how his age and life expectancy, clinical stage, Gleason score, and PSA affect his prognosis, the decision to proceed with additional staging studies, as well as how that information influences recommended treatment strategies. We discussed the roles for active surveillance, radiation therapy, surgical therapy, androgen deprivation, as well as ablative therapy options for the treatment of prostate cancer as appropriate to his individual cancer situation. We discussed the risks and benefits of these options with regard to their impact on cancer control and also in terms of potential adverse events, complications, and impact on quality of life particularly related to urinary and sexual function. The patient was encouraged to ask questions throughout the discussion today and all questions were answered to his stated satisfaction. In addition, the patient was provided with and/or directed to appropriate resources and literature for further education about prostate cancer and treatment options.   The patient has decided to proceed with cystoscopy, brachytherapy seed and SpaceOAR placement as primary treatment of his risk prostate cancer.  The risks, benefits and alternatives of the aforementioned procedures was discussed in detail.  Risks include, bur are not limited to worsening LUTS, erectile dysfunction, rectal irritation, urethral stricture formation, fistula formation, cancer recurrence, MI, CVA, PE, DVT and the inherent risk of general anesthesia.  He voices understanding and wishes to proceed.    Ellison Hughs, MD 04/04/2019, 8:34 AM  [facility] Pager: [phone]

## 2019-04-04 NOTE — Transfer of Care (Signed)
Immediate Anesthesia Transfer of Care Note  Patient: [name]  Procedure(s) Performed: RADIOACTIVE SEED IMPLANT/BRACHYTHERAPY IMPLANT, cystoscopy (N/A Prostate) CYSTOSCOPY (Bladder)  Patient Location: PACU  Anesthesia Type:General  Level of Consciousness: drowsy, patient cooperative and responds to stimulation  Airway & Oxygen Therapy: Patient Spontanous Breathing and Patient connected to face mask oxygen  Post-op Assessment: Report given to RN and Post -op Vital signs reviewed and stable  Post vital signs: Reviewed and stable  Last Vitals:  Vitals Value Taken Time  BP 138/97 04/04/19 1044  Temp 36.4 C 04/04/19 1043  Pulse 88 04/04/19 1045  Resp 9 04/04/19 1045  SpO2 100 % 04/04/19 1045  Vitals shown include unvalidated device data.  Last Pain:  Vitals:   04/04/19 0823  TempSrc: Oral  PainSc: 2       Patients Stated Pain Goal: 3 ([id number])  Complications: No apparent anesthesia complications

## 2019-04-04 NOTE — Discharge Instructions (Signed)
°  Post Anesthesia Home Care Instructions  Activity: Get plenty of rest for the remainder of the day. A responsible individual must stay with you for 24 hours following the procedure.  For the next 24 hours, DO NOT: -Drive a car -Operate machinery -Drink alcoholic beverages -Take any medication unless instructed by your physician -Make any legal decisions or sign important papers.  Meals: Start with liquid foods such as gelatin or soup. Progress to regular foods as tolerated. Avoid greasy, spicy, heavy foods. If nausea and/or vomiting occur, drink only clear liquids until the nausea and/or vomiting subsides. Call your physician if vomiting continues.  Special Instructions/Symptoms: Your throat may feel dry or sore from the anesthesia or the breathing tube placed in your throat during surgery. If this causes discomfort, gargle with warm salt water. The discomfort should disappear within 24 hours.  Radioactive Seed Implant Home Care Instructions   Activity:    Rest for the remainder of the day.  Do not drive or operate equipment today.  You may resume normal  activities in a few days as instructed by your physician, without risk of harmful radiation exposure to those around you, provided you follow the time and distance precautions on the Radiation Oncology Instruction Sheet.   Meals: Drink plenty of lipuids and eat light foods, such as gelatin or soup this evening .  You may return to normal meal plan tomorrow.  Return To Work: You may return to work as instructed by your physician.  Special Instruction:   If any seeds are found, use tweezers to pick up seeds and place in a glass container of any kind and bring to your physician's office.  Call your physician if any of these symptoms occur:  Persistent or heavy bleeding Urine stream diminishes or stops completely after catheter is removed Fever equal to or greater than 101 degrees F Cloudy urine with a strong foul odor Severe  pain  You may feel some burning pain and/or hesitancy when you urinate after the catheter is removed.  These symptoms may increase over the next few weeks, but should diminish within forur to six weeks.  Applying moist heat to the lower abdomen or a hot tub bath may help relieve the pain.  If the discomfort becomes severe, please call your physician for additional medications.   

## 2019-04-04 NOTE — Op Note (Signed)
PATIENT:  [name]  PRE-OPERATIVE DIAGNOSIS:  Adenocarcinoma of the prostate  POST-OPERATIVE DIAGNOSIS:  Same  PROCEDURE:  1. I-125 radioactive seed implantation 2. Cystoscopy  3. Placement of SpaceOAR  SURGEON:  Ellison Hughs, MD  Radiation oncologist: Tyler Pita, MD  ANESTHESIA:  General  EBL:  Minimal  DRAINS: None  INDICATION: RAFFI SLISZ is a 69 year old male with a history of T1c, Gleason 4+3 equal 7 prostate cancer that was diagnosed in July 2020.  He is here today for brachytherapy seed and SpaceOAR placement for primary treatment of his intermediate risk prostate cancer.  He has been consented for the above procedures, voices understanding and wishes to proceed.  Description of procedure: After informed consent the patient was brought to the major OR, placed on the table and administered general anesthesia. He was then moved to the modified lithotomy position with his perineum perpendicular to the floor. His perineum and genitalia were then sterilely prepped. An official timeout was then performed. A 16 French Foley catheter was then placed in the bladder and filled with dilute contrast, a rectal tube was placed in the rectum and the transrectal ultrasound probe was placed in the rectum and affixed to the stand. He was then sterilely draped.  Real time ultrasonography was used along with the seed planning software. This was used to develop the seed plan including the number of needles as well as number of seeds required for complete and adequate coverage. Real-time ultrasonography was then used along with the previously developed plan and the Nucletron device to implant a total of 58 seeds using 25 needles. This proceeded without difficulty or complication.   I then proceeded with placement of SpaceOAR by introducing a needle with the bevel angled inferiorly approximately 2 cm superior to the anus. This was angled downward and under direct ultrasound was placed  within the space between the prostatic capsule and rectum. This was confirmed with a small amount of sterile saline injected and this was performed under direct ultrasound. I then attached the SpaceOAR to the needle and injected this in the space between the prostate and rectum with good placement noted.  A Foley catheter was then removed as well as the transrectal ultrasound probe and rectal probe. Flexible cystoscopy was then performed using the 16 French flexible scope which revealed a normal urethra throughout its length down to the sphincter which appeared intact. The prostatic urethra revealed bilobar hypertrophy but no evidence of obstruction, seeds, spacers or lesions. The bladder was then entered and fully and systematically inspected. The ureteral orifices were noted to be of normal configuration and position. The mucosa revealed no evidence of tumors. There were also no stones identified within the bladder. I noted no seeds or spacers on the floor of the bladder and retroflexion of the scope revealed no seeds protruding from the base of the prostate.  The cystoscope was then removed and the patient was awakened and taken to recovery room in stable and satisfactory condition. He tolerated procedure well and there were no intraoperative complications.  Plan:  Follow-up in one month for a post-op visit and repeat PSA in 3-4 months.

## 2019-04-04 NOTE — Addendum Note (Signed)
Addendum  created 04/04/19 1346 by Glory Buff, CRNA   Charge Capture section accepted

## 2019-04-04 NOTE — Anesthesia Procedure Notes (Signed)
Procedure Name: LMA Insertion Date/Time: 04/04/2019 9:35 AM Performed by: Glory Buff, CRNA Pre-anesthesia Checklist: Patient identified, Emergency Drugs available, Suction available and Patient being monitored Patient Re-evaluated:Patient Re-evaluated prior to induction Oxygen Delivery Method: Circle system utilized Preoxygenation: Pre-oxygenation with 100% oxygen Induction Type: IV induction LMA: LMA inserted LMA Size: 4.0 Number of attempts: 1 Placement Confirmation: positive ETCO2 Tube secured with: Tape Dental Injury: Teeth and Oropharynx as per pre-operative assessment

## 2019-04-06 ENCOUNTER — Ambulatory Visit: Payer: Medicare HMO | Admitting: Cardiovascular Disease

## 2019-04-08 NOTE — Progress Notes (Signed)
  Radiation Oncology         [phone] ________________________________  Name: [name] MRN: [id number]  Date: 04/08/2019  DOB: 1950-11-21       Prostate Seed Implant  FO:[id number], Elyse Jarvis, MD  No ref. provider found  DIAGNOSIS: 69 y.o. gentleman with Stage T1c adenocarcinoma of the prostate with Gleason score of 3+4, and PSA of 5.64  PROCEDURE: Insertion of radioactive I-125 seeds into the prostate gland.  RADIATION DOSE: 145 Gy, definitive/boost therapy.  TECHNIQUE: CASMIER TARDIF was brought to the operating room with the urologist. He was placed in the dorsolithotomy position. He was catheterized and a rectal tube was inserted. The perineum was shaved, prepped and draped. The ultrasound probe was then introduced into the rectum to see the prostate gland.  TREATMENT DEVICE: A needle grid was attached to the ultrasound probe stand and anchor needles were placed.  3D PLANNING: The prostate was imaged in 3D using a sagittal sweep of the prostate probe. These images were transferred to the planning computer. There, the prostate, urethra and rectum were defined on each axial reconstructed image. Then, the software created an optimized 3D plan and a few seed positions were adjusted. The quality of the plan was reviewed using [facility] information for the target and the following two organs at risk:  Urethra and Rectum.  Then the accepted plan was printed and handed off to the radiation therapist.  Under my supervision, the custom loading of the seeds and spacers was carried out and loaded into sealed vicryl sleeves.  These pre-loaded needles were then placed into the needle holder.Marland Kitchen  PROSTATE VOLUME STUDY:  Using transrectal ultrasound the volume of the prostate was verified to be 32.6 cc.  SPECIAL TREATMENT PROCEDURE/SUPERVISION AND HANDLING: The pre-loaded needles were then delivered under sagittal guidance. A total of 25 needles were used to deposit 58 seeds in the prostate gland. The  individual seed activity was 0.456 mCi.  SpaceOAR:  Yes  COMPLEX SIMULATION: At the end of the procedure, an anterior radiograph of the pelvis was obtained to document seed positioning and count. Cystoscopy was performed to check the urethra and bladder.  MICRODOSIMETRY: At the end of the procedure, the patient was emitting 0.08 mR/hr at 1 meter. Accordingly, he was considered safe for hospital discharge.  PLAN: The patient will return to the radiation oncology clinic for post implant CT dosimetry in three weeks.   ________________________________  Sheral Apley Tammi Klippel, M.D.

## 2019-04-10 ENCOUNTER — Other Ambulatory Visit: Payer: Self-pay

## 2019-04-10 ENCOUNTER — Ambulatory Visit (INDEPENDENT_AMBULATORY_CARE_PROVIDER_SITE_OTHER): Payer: Medicare HMO | Admitting: *Deleted

## 2019-04-10 DIAGNOSIS — Z951 Presence of aortocoronary bypass graft: Secondary | ICD-10-CM

## 2019-04-10 DIAGNOSIS — Z8774 Personal history of (corrected) congenital malformations of heart and circulatory system: Secondary | ICD-10-CM | POA: Diagnosis not present

## 2019-04-10 DIAGNOSIS — Z953 Presence of xenogenic heart valve: Secondary | ICD-10-CM | POA: Diagnosis not present

## 2019-04-10 DIAGNOSIS — Z5181 Encounter for therapeutic drug level monitoring: Secondary | ICD-10-CM

## 2019-04-10 LAB — POCT INR: INR: 1.3 — AB (ref 2.0–3.0)

## 2019-04-10 NOTE — Patient Instructions (Signed)
Description   Take 4 tablet today and 4 tablets tomorrow,  then continue on same dosage 7.5 mg (3 tabs) daily except for 5 mg (2 tabs) on Mondays and Fridays. Continue the 3 Lovenox injections you have left.  Recheck INR on Monday (3/8). [facility] [phone] Main [phone].

## 2019-04-10 NOTE — Progress Notes (Signed)
Pt informed that Lovenox is to prevent him from having a stroke or blood clot until his INR comes up and is therapeutic.  Recommended pt to continue Lovenox until INR is therapeutic so he is protected.  Pt stated that he dose not want to take any more Lovenox but would continue to take Lovenox until he runs out of his prescription. Pt has 3 injections left.

## 2019-04-16 ENCOUNTER — Other Ambulatory Visit: Payer: Self-pay

## 2019-04-16 ENCOUNTER — Ambulatory Visit (INDEPENDENT_AMBULATORY_CARE_PROVIDER_SITE_OTHER): Payer: Medicare HMO | Admitting: *Deleted

## 2019-04-16 DIAGNOSIS — Z951 Presence of aortocoronary bypass graft: Secondary | ICD-10-CM | POA: Diagnosis not present

## 2019-04-16 DIAGNOSIS — Z8774 Personal history of (corrected) congenital malformations of heart and circulatory system: Secondary | ICD-10-CM

## 2019-04-16 DIAGNOSIS — Z953 Presence of xenogenic heart valve: Secondary | ICD-10-CM | POA: Diagnosis not present

## 2019-04-16 DIAGNOSIS — Z5181 Encounter for therapeutic drug level monitoring: Secondary | ICD-10-CM

## 2019-04-16 LAB — POCT INR: INR: 1.6 — AB (ref 2.0–3.0)

## 2019-04-16 NOTE — Patient Instructions (Signed)
Description   Take 4 tablet today (10mg )  and 4 tablets (10mg ) tomorrow,  then start taking 7.5mg  (3 tabs) daily except for 5 mg (2 tabs) on Fridays. Recheck INR in 1 week. [facility] [phone] Main [phone].

## 2019-04-24 ENCOUNTER — Telehealth: Payer: Self-pay | Admitting: *Deleted

## 2019-04-24 ENCOUNTER — Other Ambulatory Visit: Payer: Self-pay

## 2019-04-24 ENCOUNTER — Ambulatory Visit (INDEPENDENT_AMBULATORY_CARE_PROVIDER_SITE_OTHER): Payer: Medicare HMO | Admitting: Pharmacist

## 2019-04-24 DIAGNOSIS — Z8774 Personal history of (corrected) congenital malformations of heart and circulatory system: Secondary | ICD-10-CM

## 2019-04-24 DIAGNOSIS — Z953 Presence of xenogenic heart valve: Secondary | ICD-10-CM

## 2019-04-24 DIAGNOSIS — Z951 Presence of aortocoronary bypass graft: Secondary | ICD-10-CM

## 2019-04-24 DIAGNOSIS — Z5181 Encounter for therapeutic drug level monitoring: Secondary | ICD-10-CM | POA: Diagnosis not present

## 2019-04-24 LAB — POCT INR: INR: 2.3 (ref 2.0–3.0)

## 2019-04-24 NOTE — Patient Instructions (Signed)
Continue taking 7.5mg  (3 tabs) daily except for 5 mg (2 tabs) on Fridays. Recheck INR in 3 week. [facility] [phone] Main [phone].

## 2019-04-24 NOTE — Telephone Encounter (Signed)
CALLED PATIENT TO REMIND OF POST SEED APPTS. FOR 04-25-19, SPOKE WITH PATIENT AND HE IS AWARE OF THESE APPTS.

## 2019-04-25 ENCOUNTER — Encounter: Payer: Self-pay | Admitting: Urology

## 2019-04-25 ENCOUNTER — Ambulatory Visit
Admission: RE | Admit: 2019-04-25 | Discharge: 2019-04-25 | Disposition: A | Discharge status: Home / Self Care | Payer: Medicare HMO | Source: Ambulatory Visit | Attending: Urology | Admitting: Urology

## 2019-04-25 ENCOUNTER — Other Ambulatory Visit: Payer: Self-pay

## 2019-04-25 ENCOUNTER — Ambulatory Visit
Admission: RE | Admit: 2019-04-25 | Discharge: 2019-04-25 | Disposition: A | Discharge status: Home / Self Care | Payer: Medicare HMO | Source: Ambulatory Visit | Attending: Radiation Oncology | Admitting: Radiation Oncology

## 2019-04-25 VITALS — BP 129/83 | HR 111 | Temp 97.3°F | Resp 20

## 2019-04-25 DIAGNOSIS — R3915 Urgency of urination: Secondary | ICD-10-CM | POA: Diagnosis not present

## 2019-04-25 DIAGNOSIS — Z7901 Long term (current) use of anticoagulants: Secondary | ICD-10-CM | POA: Insufficient documentation

## 2019-04-25 DIAGNOSIS — C61 Malignant neoplasm of prostate: Secondary | ICD-10-CM | POA: Insufficient documentation

## 2019-04-25 DIAGNOSIS — Z923 Personal history of irradiation: Secondary | ICD-10-CM | POA: Insufficient documentation

## 2019-04-25 DIAGNOSIS — Z79899 Other long term (current) drug therapy: Secondary | ICD-10-CM | POA: Diagnosis not present

## 2019-04-25 DIAGNOSIS — Z7982 Long term (current) use of aspirin: Secondary | ICD-10-CM | POA: Diagnosis not present

## 2019-04-25 NOTE — Progress Notes (Signed)
Radiation Oncology         ([phone]) [phone] ________________________________  Name: [name] MRN: [id number]  Date: 04/25/2019  DOB: 1950-06-08  Post-Seed Follow-Up Visit Note  CC: Michael Lung, MD  Michael Lung, MD  Diagnosis:   69 y.o. gentleman with Stage T1c adenocarcinoma of the prostate with Gleason score of 3+4, and PSA of 5.64    ICD-10-CM   1. Malignant neoplasm of prostate ([place])  C61     Interval Since Last Radiation:  3 weeks 04/04/19:  Insertion of radioactive I-125 seeds into the prostate gland; 145 Gy, definitive therapy with placement of SpaceOAR gel.  Narrative:  The patient returns today for routine follow-up.  He is complaining of increased urinary frequency and urinary hesitation symptoms. He filled out a questionnaire regarding urinary function today providing and overall IPSS score of 22 characterizing his symptoms as severe with urgency, frequency, weak stream, incomplete emptying and nocturia x4/night.  His pre-implant score was 4. He denies any abdominal pain or bowel symptoms.  ALLERGIES:  is allergic to tylenol [acetaminophen].  Meds: Current Outpatient Medications  Medication Sig Dispense Refill  . aspirin 81 MG tablet Take 81 mg by mouth every other day.     Marland Kitchen atorvastatin (LIPITOR) 20 MG tablet Take 1 tablet (20 mg total) by mouth daily at 6 PM. 90 tablet 3  . calcium carbonate (TUMS - DOSED IN MG ELEMENTAL CALCIUM) 500 MG chewable tablet Chew 1 tablet by mouth as needed for indigestion or heartburn.    . metoprolol tartrate (LOPRESSOR) 25 MG tablet Take 1 tablet (25 mg total) by mouth 2 (two) times daily. 180 tablet 3  . oxymetazoline (AFRIN) 0.05 % nasal spray Place 1 spray into both nostrils at bedtime as needed for congestion.    Marland Kitchen warfarin (COUMADIN) 2.5 MG tablet TAKE  AS DIRECTED BY COUMADIN CLINIC (Patient taking differently: TAKE  AS DIRECTED BY COUMADIN CLINIC takes 3 of 2.5 mg pills daily, except Monday and Friday take 2 of 2.5 mg pills)  80 tablet 2  . enoxaparin (LOVENOX) 100 MG/ML injection Inject 1 mL (100 mg total) into the skin every 12 (twelve) hours. (Patient not taking: Reported on 04/25/2019) 20 mL 0   No current facility-administered medications for this encounter.    Physical Findings: In general this is a well appearing Caucasian male in no acute distress. He's alert and oriented x4 and appropriate throughout the examination. Cardiopulmonary assessment is negative for acute distress and he exhibits normal effort.   Lab Findings: Lab Results  Component Value Date   WBC 5.7 04/02/2019   HGB 13.9 04/02/2019   HCT 43.2 04/02/2019   MCV 81.2 04/02/2019   PLT 158 04/02/2019    Radiographic Findings:  Patient underwent CT imaging in our clinic for post implant dosimetry. He has a pacemaker and therefore will not have MRI prostate to verify SpaceOAR gel placement. The CT will be reviewed by [name] to confirm there is an adequate distribution of radioactive seeds throughout the prostate gland and ensure that there are no seeds in or near the rectum.  We suspect the final radiation plan and dosimetry will show appropriate coverage of the prostate gland. He understands that we will call and inform him of any unexpected findings on further review of his imaging and dosimetry.  Impression/Plan: 69 y.o. gentleman with Stage T1c adenocarcinoma of the prostate with Gleason score of 3+4, and PSA of 5.64. The patient is recovering from the effects of radiation.  His urinary symptoms should gradually improve over the next 4-6 months. We talked about this today. He is encouraged by his improvement already and is otherwise pleased with his outcome. We also talked about long-term follow-up for prostate cancer following seed implant. He understands that ongoing PSA determinations and digital rectal exams will help perform surveillance to rule out disease recurrence. He does not currently have a follow up appointment scheduled with Dr.  Lovena [name] but anticipates a follow up with repeat PSA in May 2021. He will call to schedule the follow up visit if he has not heard from the urology office by middle of next week.  He understands what to expect with his PSA measures. Patient was also educated today about some of the long-term effects from radiation including a small risk for rectal bleeding and possibly erectile dysfunction. We talked about some of the general management approaches to these potential complications. However, I did encourage the patient to contact our office or return at any point if he has questions or concerns related to his previous radiation and prostate cancer.  Today, a comprehensive survivorship care plan and treatment summary was reviewed with the patient today detailing his prostate cancer diagnosis, treatment course, potential late/long-term effects of treatment, appropriate follow-up care with recommendations for the future, and patient education resources.  A copy of this summary, along with a letter will be sent to the patient's primary care provider via mail/fax/In Basket message after today's visit.  2. Cancer screening:  Due to [name] history and his age, he should receive screening for skin cancers, colon cancer, and [name] cancer.  The information and recommendations are listed on the patient's comprehensive care plan/treatment summary and were reviewed in detail with the patient.     3. Health maintenance and wellness promotion: [name] was encouraged to consume 5-7 servings of fruits and vegetables per day. He was provided a copy of the "Nutrition Rainbow" handout, as well as the handout "Take Control of Your Health and [place]" from the [facility].  He was also encouraged to engage in moderate to vigorous exercise for 30 minutes per day most days of the week. Information was provided regarding the [facility] fitness program, which is designed for cancer survivors to help  them become more physically fit after cancer treatments. We discussed that a healthy BMI is 18.5-24.9 and that maintaining a healthy weight reduces risk of cancer recurrences.  He was instructed to limit his alcohol consumption and continue to abstain from tobacco use.  Lastly, he was encouraged to use sunscreen and wear protective clothing when in the sun.     4. Support services/counseling: It is not uncommon for this period of the patient's cancer care trajectory to be one of many emotions and stressors.  Mr. Smyser was encouraged to take advantage of our many support services programs, support groups, and/or counseling in coping with his new life as a cancer survivor after completing anti-cancer treatment.  He was offered support today through active listening and expressive supportive counseling.  He was given information regarding our available services and encouraged to contact me with any questions or for help enrolling in any of our support group/programs.       Nicholos Johns, PA-C

## 2019-04-26 ENCOUNTER — Telehealth: Payer: Self-pay | Admitting: *Deleted

## 2019-04-26 NOTE — Telephone Encounter (Signed)
Returned patient's phone call, spoke with patient 

## 2019-04-27 NOTE — Progress Notes (Signed)
  Radiation Oncology         ([phone]) [phone] ________________________________  Name: [name] MRN: [id number]  Date: 04/25/2019  DOB: [birth year]  COMPLEX SIMULATION NOTE  NARRATIVE:  The patient was brought to the [facility] today following prostate seed implantation approximately one month ago.  Identity was confirmed.  All relevant records and images related to the planned course of therapy were reviewed.  Then, the patient was set-up supine.  CT images were obtained.  The CT images were loaded into the planning software.  Then the prostate and rectum were contoured.  Treatment planning then occurred.  The implanted iodine 125 seeds were identified by the physics staff for projection of radiation distribution  I have requested : 3D Simulation  I have requested a DVH of the following structures: Prostate and rectum.    ________________________________  Sheral Apley Tammi Klippel, M.D.

## 2019-04-30 ENCOUNTER — Encounter: Payer: Self-pay | Admitting: Family Medicine

## 2019-04-30 ENCOUNTER — Ambulatory Visit (INDEPENDENT_AMBULATORY_CARE_PROVIDER_SITE_OTHER): Payer: Medicare HMO | Admitting: Family Medicine

## 2019-04-30 ENCOUNTER — Other Ambulatory Visit: Payer: Self-pay

## 2019-04-30 VITALS — BP 120/82 | HR 99 | Temp 97.7°F | Ht 67.75 in | Wt 234.4 lb

## 2019-04-30 DIAGNOSIS — I442 Atrioventricular block, complete: Secondary | ICD-10-CM

## 2019-04-30 DIAGNOSIS — Z953 Presence of xenogenic heart valve: Secondary | ICD-10-CM | POA: Diagnosis not present

## 2019-04-30 DIAGNOSIS — E782 Mixed hyperlipidemia: Secondary | ICD-10-CM

## 2019-04-30 DIAGNOSIS — L570 Actinic keratosis: Secondary | ICD-10-CM

## 2019-04-30 DIAGNOSIS — C61 Malignant neoplasm of prostate: Secondary | ICD-10-CM | POA: Diagnosis not present

## 2019-04-30 DIAGNOSIS — Z0000 Encounter for general adult medical examination without abnormal findings: Secondary | ICD-10-CM | POA: Diagnosis not present

## 2019-04-30 DIAGNOSIS — E291 Testicular hypofunction: Secondary | ICD-10-CM | POA: Diagnosis not present

## 2019-04-30 DIAGNOSIS — Z951 Presence of aortocoronary bypass graft: Secondary | ICD-10-CM | POA: Diagnosis not present

## 2019-04-30 DIAGNOSIS — E669 Obesity, unspecified: Secondary | ICD-10-CM

## 2019-04-30 DIAGNOSIS — Z87891 Personal history of nicotine dependence: Secondary | ICD-10-CM

## 2019-04-30 DIAGNOSIS — I700 Atherosclerosis of aorta: Secondary | ICD-10-CM

## 2019-04-30 MED ORDER — FLUOROURACIL 5 % EX CREA: TOPICAL_CREAM | Freq: Every day | CUTANEOUS | 0 refills | Status: DC

## 2019-04-30 NOTE — Patient Instructions (Signed)
  Mr. Rinne , Thank you for taking time to come for your Medicare Wellness Visit. I appreciate your ongoing commitment to your health goals. Please review the following plan we discussed and let me know if I can assist you in the future.   These are the goals we discussed: Make diet and exercise changes.  Follow-up with cardiology as well as cardiovascular.  Use the Efudex as recommended. This is a list of the screening recommended for you and due dates:  Health Maintenance  Topic Date Due  . Tetanus Vaccine  12/26/2021  . Colon Cancer Screening  06/02/2023  . Flu Shot  Completed  .  Hepatitis C: One time screening is recommended by Center for Disease Control  (CDC) for  adults born from [birth year] through 1965.   Completed  . Pneumonia vaccines  Discontinued

## 2019-04-30 NOTE — Progress Notes (Signed)
[name] is a 69 y.o. male who presents for annual wellness visit,CPE and follow-up on chronic medical conditions.  He has had a very busy last year.  He had an elevated PSA and ended up getting a biopsy which showed a Gleason score of 7.  He had seeds implanted approximately 3 weeks ago.  He seems to be doing fairly well from that.  He does have a history of hypogonadism however this is not being treated at present time. Also over the last several months he has had aortic and mitral valve replacement as well as CABG x2 and now has a pacer due to third-degree block.  He is being followed by cardiology as well as cardiovascular and seems to be doing quite nicely.  During the work-up it also showed an ectatic ascending abdominal aorta of approximately 4 cm which will need follow-up.  It also showed evidence of atherosclerosis.  Presently he is on Coumadin.  He is also taking atorvastatin and Lopressor.  He continues on Coumadin and is being followed by cardiology for this.  He is now former smoker.  Otherwise his social and family history is unchanged.   Immunizations and Health Maintenance Immunization History  Administered Date(s) Administered  . DTaP 10/31/1995  . Fluad Quad(high Dose 65+) 10/20/2018  . Influenza Split 10/23/2010, 12/27/2011  . Influenza, High Dose Seasonal PF 11/05/2015, 04/26/2017  . Influenza,inj,Quad PF,6+ Mos 12/17/2013  . PPD Test 11/05/2015  . Pneumococcal Conjugate-13 11/05/2015  . Pneumococcal Polysaccharide-23 10/23/2010  . Tdap 12/27/2011  . Zoster 10/23/2010   There are no preventive care reminders to display for this patient.  Last colonoscopy: 03/20/2008 Last PSA:  02/16/19 Dentist: Q Six Months Ophtho: Q Four years Exercise: Not much  Other doctors caring for patient include: Dr.Manning Oncology , Dr. Angelena Form cardio, Dr.Winters urology Dr Roxy Manns  Advanced Directives: not on file .  Copy asked for    Depression screen:  See questionnaire below.      Depression screen [facility] 2/9 04/30/2019 04/28/2018 04/26/2017 11/05/2015 12/27/2011  Decreased Interest 0 0 0 0 0  Down, Depressed, Hopeless 0 0 0 0 0  PHQ - 2 Score 0 0 0 0 0    Fall Screen: See Questionaire below.   Fall Risk  04/30/2019 04/28/2018 04/26/2017 11/05/2015 12/27/2011  Falls in the past year? 0 0 No No No    ADL screen:  See questionnaire below.  Functional Status Survey: Is the patient deaf or have difficulty hearing?: No Does the patient have difficulty seeing, even when wearing glasses/contacts?: No Does the patient have difficulty concentrating, remembering, or making decisions?: No Does the patient have difficulty walking or climbing stairs?: No Does the patient have difficulty dressing or bathing?: No Does the patient have difficulty doing errands alone such as visiting a doctor's office or shopping?: No   Review of Systems  Constitutional: -, -unexpected weight change, -anorexia, -fatigue Allergy: -sneezing, -itching, -congestion Dermatology: Has lesions on his face that he would like evaluated ENT: -runny nose, -ear pain, -sore throat,  Cardiology:  -chest pain, -palpitations, -orthopnea, Respiratory: -cough, -shortness of breath, -dyspnea on exertion, -wheezing,  Gastroenterology: -abdominal pain, -nausea, -vomiting, -diarrhea, -constipation, -dysphagia Hematology: -bleeding or bruising problems Musculoskeletal: -arthralgias, -myalgias, -joint swelling, -back pain, - Ophthalmology: -vision changes,  Urology: -dysuria, -difficulty urinating,  -urinary frequency, -urgency, incontinence Neurology: -, -numbness, , -memory loss, -falls, -dizziness    PHYSICAL EXAM:  BP 120/82 (BP Location: Left Arm, Patient Position: Sitting)   Pulse 99  Temp 97.7 F (36.5 C)   Ht 5' 7.75" (1.721 m)   Wt 234 lb 6.4 oz (106.3 kg)   SpO2 94%   BMI 35.90 kg/m   General Appearance: Alert, cooperative, no distress, appears stated age Head: Normocephalic, without obvious  abnormality, atraumatic Eyes: PERRL, conjunctiva/corneas clear, EOM's intact, Ears: Normal TM's and external ear canals Nose: Nares normal, mucosa normal, no drainage or sinus   tenderness Throat: Lips, mucosa, and tongue normal; teeth and gums normal Neck: Supple, no lymphadenopathy, thyroid:no enlargement/tenderness/nodules; no carotid bruit or JVD Lungs: Clear to auscultation bilaterally without wheezes, rales or ronchi; respirations unlabored Heart: Regular rate and rhythm, S1 and S2 normal, no murmur, rub or gallop Abdomen: Soft, non-tender, nondistended, normoactive bowel sounds, no masses, no hepatosplenomegaly Extremities: No clubbing, cyanosis or edema Pulses: 2+ and symmetric all extremities Skin: Slightly erythematous dry patches noted on his face.   Lymph nodes: Cervical, supraclavicular, and axillary nodes normal Neurologic: CNII-XII intact, normal strength, sensation and gait; reflexes 2+ and symmetric throughout   Psych: Normal mood, affect, hygiene and grooming His blood work was reviewed.  ASSESSMENT/PLAN: Complete heart block (HCC)  Obesity (BMI 30-39.9)  S/P aortic + mitral valve replacement with bioprosthetic valves + CABG x2  S/P CABG x 2  Malignant neoplasm of prostate (HCC)  Former smoker  Mixed hyperlipidemia  Atherosclerosis of aorta (HCC)  Actinic keratosis - Plan: fluorouracil (EFUDEX) 5 % cream  Hypogonadism in male He will continue on his present medication regimen and follow-up with cardiology as well as urology and cardiovascular.  Noticed indicate he needs follow-up on the ascending aorta with a CTA in roughly August.  Have asked him to discuss this with cardiology.  Also strongly encouraged him to get a living will.  Discussed the use of Efudex with him. Also discussed the need for him to get involved in a diet and exercise program to help with his weight reduction, blood pressure and risk for diabetes.  recommended at least 30 minutes of aerobic  activity at least 5 days/week; healthy diet and alcohol recommendations (less than or equal to 2 drinks/day)  Immunization recommendations discussed.  Colonoscopy recommendations reviewed.   Medicare Attestation I have personally reviewed: The patient's medical and social history Their use of alcohol, tobacco or illicit drugs Their current medications and supplements The patient's functional ability including ADLs,fall risks, home safety risks, cognitive, and hearing and visual impairment Diet and physical activities Evidence for depression or mood disorders  The patient's weight, height, and BMI have been recorded in the chart.  I have made referrals, counseling, and provided education to the patient based on review of the above and I have provided the patient with a written personalized care plan for preventive services.     Jill Alexanders, MD   04/30/2019

## 2019-05-02 DIAGNOSIS — C61 Malignant neoplasm of prostate: Secondary | ICD-10-CM | POA: Diagnosis not present

## 2019-05-09 ENCOUNTER — Encounter: Payer: Self-pay | Admitting: Radiation Oncology

## 2019-05-09 DIAGNOSIS — C61 Malignant neoplasm of prostate: Secondary | ICD-10-CM | POA: Diagnosis not present

## 2019-05-16 NOTE — Progress Notes (Signed)
Chief Complaint  Patient presents with  . Follow-up    Aortic stenosis    History of Present Illness: 69 yo male with history of aortic stenosis, mitral stenosis, tobacco abuse, hyperlipidemia, prostate cancer and esophageal stricture here today for cardiac follow up. He is known to have severe aortic stenosis, moderate aortic regurgitation and mitral stenosis. Echo 12/14/16 with normal LV size and function, LVEF=60-65%. Moderate LVH. Moderate aortic stenosis (mean gradient 35 mmHg, peak gradient 63 mmHg, AVA 1.21 cm2), moderate AI, moderate MS. echo May 2020 with with normal LV size and function, LVEF=65%. Moderate LVH. Severe aortic stenosis (mean gradient 45 mmHg, peak gradient 70 mmHg), moderate AI, severe MS (mean gradient 13 mmHg). Cardiac cath August 2020 with chronically occluded proximal RCA and severe stenosis in the mid LAD. He underwent 2V CABG (LIMA to LAD, SVG to RCA), bioprosthetic AVR and bioprosthetic MVR, left atrial appendage clipping and PFO closure in September 2020. His post-operative course was complicated by bradycardia and atrial flutter requiring pacemaker placement. He was discharged on coumadin. He remained in atrial flutter for the next month but then converted to sinus. He has been diagnosed with prostate cancer and is now being treated with radioactive seeds.   He is here today for follow up. The patient denies any chest pain, dyspnea, palpitations, lower extremity edema, orthopnea, PND, dizziness, near syncope or syncope.  .  Primary Care Physician: Denita Lung, MD  Past Medical History:  Diagnosis Date  . BCE (basal cell epithelioma), arm    RIGHT SHOULDER  . Bradycardia   . Bumps on skin    on left side of nose for last 3 weeks  . CAD (coronary artery disease)    a. s/p CABGx2 10/11/18 LIMA to LAD, SVG to RCA, EVH via right thigh.  . Dyslipidemia   . Ectatic thoracic aorta ([place])   . Esophageal stricture   . GERD (gastroesophageal reflux disease)   .  Heart murmur    sees dr Clydene Fake  . Hyperlipidemia   . Incidental pulmonary nodule, > 39mm and < 28mm 10/04/2018   Noted on CTA  . Inguinal hernia    bilateral  . Obesity   . Presence of permanent cardiac pacemaker   . Prostate cancer ([place])   . S/P aortic valve replacement with bioprosthetic valve 10/11/2018   23 mm Edwards Inspiris Resilia stented bovine pericardial tissue valve, miltral valve done also  . S/P CABG x 2 10/11/2018   LIMA to LAD, SVG to RCA, EVH via right thigh  . S/P mitral valve replacement with bioprosthetic valve 10/11/2018   27 mm Medtronic Mosaic stented porcine bioprosthetic tissue valve  . S/P patent foramen ovale closure 10/11/2018  . S/P placement of cardiac pacemaker   . Smoker     Past Surgical History:  Procedure Laterality Date  . AORTIC VALVE REPLACEMENT N/A 10/11/2018   Procedure: AORTIC VALVE REPLACEMENT (AVR) with 23 Inspiris Bioprosthetic Aortic valve.;  Surgeon: Rexene Alberts, MD;  Location: [facility];  Service: Open Heart Surgery;  Laterality: N/A;  . CLIPPING OF ATRIAL APPENDAGE N/A 10/11/2018   Procedure: CLIPPING OF LEFT ATRIAL APPENDAGE with 45 AtriCure Clip.;  Surgeon: Rexene Alberts, MD;  Location: [place];  Service: Open Heart Surgery;  Laterality: N/A;  . COLONOSCOPY  03/2008,2004   Dr. Amedeo Plenty  . CORONARY ARTERY BYPASS GRAFT N/A 10/11/2018   Procedure: CORONARY ARTERY BYPASS GRAFTING (CABG) x 2 using LIMA to the LAD and endoscopic greater saphenous vein harvesting to  the RCA.;  Surgeon: Rexene Alberts, MD;  Location: [place];  Service: Open Heart Surgery;  Laterality: N/A;  . CYSTOSCOPY  04/04/2019   Procedure: CYSTOSCOPY;  Surgeon: Ceasar Mons, MD;  Location: [facility];  Service: Urology;;  . HERNIA REPAIR  12/11/10   BIH  . MITRAL VALVE REPLACEMENT N/A 10/11/2018   Procedure: MITRAL VALVE (MV) REPLACEMENT with 27 Mosaic Bioprosthetic Mitral valve.;  Surgeon: Rexene Alberts, MD;  Location: [place];  Service: Open Heart  Surgery;  Laterality: N/A;  . PACEMAKER IMPLANT N/A 10/17/2018   Procedure: PACEMAKER IMPLANT;  Surgeon: Thompson Grayer, MD;  Location: [place] [facility];  Service: Cardiovascular;  Laterality: N/A;  . RADIOACTIVE SEED IMPLANT N/A 04/04/2019   Procedure: RADIOACTIVE SEED IMPLANT/BRACHYTHERAPY IMPLANT, cystoscopy;  Surgeon: Ceasar Mons, MD;  Location: [facility];  Service: Urology;  Laterality: N/A;  58 seeds  . REPAIR OF PATENT FORAMEN OVALE N/A 10/11/2018   Procedure: CLOSURE OF PATENT FORAMEN OVALE;  Surgeon: Rexene Alberts, MD;  Location: [place];  Service: Open Heart Surgery;  Laterality: N/A;  . RIGHT/LEFT HEART CATH AND CORONARY ANGIOGRAPHY N/A 09/15/2018   Procedure: RIGHT/LEFT HEART CATH AND CORONARY ANGIOGRAPHY;  Surgeon: Burnell Blanks, MD;  Location: [place] [facility];  Service: Cardiovascular;  Laterality: N/A;  . TEE WITHOUT CARDIOVERSION N/A 12/12/2015   Procedure: TRANSESOPHAGEAL ECHOCARDIOGRAM (TEE);  Surgeon: Sanda Klein, MD;  Location: [place];  Service: Cardiovascular;  Laterality: N/A;  . TEE WITHOUT CARDIOVERSION N/A 10/11/2018   Procedure: TRANSESOPHAGEAL ECHOCARDIOGRAM (TEE);  Surgeon: Rexene Alberts, MD;  Location: [place];  Service: Open Heart Surgery;  Laterality: N/A;  . THYMECTOMY  1976   radaiation tx done    Current Outpatient Medications  Medication Sig Dispense Refill  . aspirin 81 MG tablet Take 81 mg by mouth every other day.     Marland Kitchen atorvastatin (LIPITOR) 20 MG tablet Take 1 tablet (20 mg total) by mouth daily at 6 PM. 90 tablet 3  . calcium carbonate (TUMS - DOSED IN MG ELEMENTAL CALCIUM) 500 MG chewable tablet Chew 1 tablet by mouth as needed for indigestion or heartburn.    . enoxaparin (LOVENOX) 100 MG/ML injection Inject 1 mL (100 mg total) into the skin every 12 (twelve) hours. 20 mL 0  . fluorouracil (EFUDEX) 5 % cream Apply topically daily. For 4 weeks as tolerated 40 g 0  . metoprolol tartrate (LOPRESSOR) 25 MG  tablet Take 1 tablet (25 mg total) by mouth 2 (two) times daily. 180 tablet 3  . oxymetazoline (AFRIN) 0.05 % nasal spray Place 1 spray into both nostrils at bedtime as needed for congestion.    Marland Kitchen warfarin (COUMADIN) 2.5 MG tablet TAKE  AS DIRECTED BY COUMADIN CLINIC (Patient taking differently: TAKE  AS DIRECTED BY COUMADIN CLINIC takes 3 of 2.5 mg pills daily, except Monday and Friday take 2 of 2.5 mg pills) 80 tablet 2  . amoxicillin (AMOXIL) 500 MG capsule Take 4 tablets (2000 mg) by mouth ONE HOUR PRIOR TO ALL DENTAL PROCEDURES 12 capsule 1   No current facility-administered medications for this visit.    Allergies  Allergen Reactions  . Tylenol [Acetaminophen] Nausea Only    Social History   Socioeconomic History  . Marital status: Married    Spouse name: Not on file  . Number of children: 0  . Years of education: Not on file  . Highest education level: Not on file  Occupational History  .  Occupation: Programme researcher, broadcasting/film/video: [employer]  Tobacco Use  . Smoking status: Former Smoker    Packs/day: 0.25    Years: 35.00    Pack years: 8.75    Types: Cigarettes    Quit date: 09/04/2018    Years since quitting: 0.6  . Smokeless tobacco: Never Used  Substance and Sexual Activity  . Alcohol use: Yes    Alcohol/week: 1.0 standard drinks    Types: 1 Cans of beer per week    Comment: rare  . Drug use: No  . Sexual activity: Yes  Other Topics Concern  . Not on file  Social History Narrative  . Not on file   Social Determinants of Health   Financial Resource Strain:   . Difficulty of Paying Living Expenses:   Food Insecurity:   . Worried About Charity fundraiser in the Last Year:   . Arboriculturist in the Last Year:   Transportation Needs:   . Film/video editor (Medical):   Marland Kitchen Lack of Transportation (Non-Medical):   Physical Activity:   . Days of Exercise per Week:   . Minutes of Exercise per Session:   Stress:   . Feeling of Stress :   Social Connections:    . Frequency of Communication with Friends and Family:   . Frequency of Social Gatherings with Friends and Family:   . Attends Religious Services:   . Active Member of Clubs or Organizations:   . Attends Archivist Meetings:   Marland Kitchen Marital Status:   Intimate Partner Violence:   . Fear of Current or Ex-Partner:   . Emotionally Abused:   Marland Kitchen Physically Abused:   . Sexually Abused:     Family History  Problem Relation Age of Onset  . Colon cancer Mother   . Lung cancer Father        smoker  . CAD Neg Hx   . Pancreatic cancer Neg Hx   . Breast cancer Neg Hx     Review of Systems:  As stated in the HPI and otherwise negative.   BP 118/70   Pulse 86   Ht 5' 7.75" (1.721 m)   Wt 237 lb 6.4 oz (107.7 kg)   SpO2 97%   BMI 36.36 kg/m   Physical Examination: General: Well developed, well nourished, NAD  HEENT: OP clear, mucus membranes moist  SKIN: warm, dry. No rashes. Neuro: No focal deficits  Musculoskeletal: Muscle strength 5/5 all ext  Psychiatric: Mood and affect normal  Neck: No JVD, no carotid bruits, no thyromegaly, no lymphadenopathy.  Lungs:Clear bilaterally, no wheezes, rhonci, crackles Cardiovascular: Regular rate and rhythm. No murmurs, gallops or rubs. Abdomen:Soft. Bowel sounds present. Non-tender.  Extremities: No lower extremity edema. Pulses are 2 + in the bilateral DP/PT.  Echo 12/27/17: Left ventricle: The cavity size was normal. There was moderate   concentric hypertrophy. Systolic function was normal. The   estimated ejection fraction was in the range of 60% to 65%. Wall   motion was normal; there were no regional wall motion   abnormalities. Features are consistent with a pseudonormal left   ventricular filling pattern, with concomitant abnormal relaxation   and increased filling pressure (grade 2 diastolic dysfunction).   Doppler parameters are consistent with high ventricular filling   pressure. - Aortic valve: There was moderate to severe  stenosis. There was   moderate regurgitation. Valve area (VTI): 1.03 cm^2. Valve area   (Vmax): 0.82 cm^2. Valve area (Vmean): 0.85  cm^2. - Mitral valve: Severely calcified annulus. Severe diffuse   thickening and calcification of the anterior leaflet and   posterior leaflet. Mobility was restricted. The findings are   consistent with moderate stenosis. Mean gradient (D): 9 mm Hg.   Valve area by continuity equation (using LVOT flow): 1.53 cm^2. - Left atrium: The atrium was moderately dilated. - Right ventricle: The cavity size was mildly to moderately   dilated. Wall thickness was normal. - Tricuspid valve: There was trivial regurgitation. - Pulmonic valve: There was trivial regurgitation.  EKG:  EKG is not ordered today. The ekg ordered today demonstrates.   Recent Labs: 10/13/2018: Magnesium 2.5 11/02/2018: TSH 1.730 04/02/2019: ALT 22; BUN 17; Creatinine, Ser 0.83; Hemoglobin 13.9; Platelets 158; Potassium 3.8; Sodium 139   Lipid Panel    Component Value Date/Time   CHOL 98 (L) 12/14/2018 0948   TRIG 81 12/14/2018 0948   HDL 46 12/14/2018 0948   CHOLHDL 2.1 12/14/2018 0948   CHOLHDL 4.2 03/25/2015 0001   VLDL 30 03/25/2015 0001   LDLCALC 36 12/14/2018 0948     Wt Readings from Last 3 Encounters:  05/17/19 237 lb 6.4 oz (107.7 kg)  04/30/19 234 lb 6.4 oz (106.3 kg)  04/04/19 232 lb 4.8 oz (105.4 kg)     Other studies Reviewed: Additional studies/ records that were reviewed today include: . Review of the above records demonstrates:   Assessment and Plan:   1. Severe Aortic stenosis/moderate aortic insufficiency: He is now s/p placement of a bioprosthetic AVR in September 2020. Will arrange an echo not assess his valves post replacement.   2. Mitral valve stenosis: He is s/p bioprosthetic MVR in September 2020.   3. Complete heart block: Pacemaker in place. Followed by Dr. Rayann Heman.   4. Atrial flutter, paroxysmal: He is in sinus today on exam. CHADS VASC score 3.  Continue coumadin.   5. CAD s/p CABG: No chest pain. Continue ASA, stain and beta blocker  Current medicines are reviewed at length with the patient today.  The patient does not have concerns regarding medicines.  The following changes have been made:  no change  Labs/ tests ordered today include:   Orders Placed This Encounter  Procedures  . ECHOCARDIOGRAM COMPLETE    Disposition:   FU with me in 12 months  Signed, Lauree Chandler, MD 05/17/2019 11:35 AM    [facility] [address], [place], [place]  [address] Phone: [phone]; Fax: [phone]

## 2019-05-17 ENCOUNTER — Encounter: Payer: Self-pay | Admitting: Cardiovascular Disease

## 2019-05-17 ENCOUNTER — Other Ambulatory Visit: Payer: Self-pay

## 2019-05-17 ENCOUNTER — Ambulatory Visit (INDEPENDENT_AMBULATORY_CARE_PROVIDER_SITE_OTHER): Payer: Medicare HMO | Admitting: Pharmacist

## 2019-05-17 ENCOUNTER — Ambulatory Visit: Payer: Medicare HMO | Admitting: Cardiovascular Disease

## 2019-05-17 VITALS — BP 118/70 | HR 86 | Ht 67.75 in | Wt 237.4 lb

## 2019-05-17 DIAGNOSIS — I442 Atrioventricular block, complete: Secondary | ICD-10-CM

## 2019-05-17 DIAGNOSIS — Z8774 Personal history of (corrected) congenital malformations of heart and circulatory system: Secondary | ICD-10-CM | POA: Diagnosis not present

## 2019-05-17 DIAGNOSIS — I2510 Atherosclerotic heart disease of native coronary artery without angina pectoris: Secondary | ICD-10-CM

## 2019-05-17 DIAGNOSIS — I4892 Unspecified atrial flutter: Secondary | ICD-10-CM

## 2019-05-17 DIAGNOSIS — Z5181 Encounter for therapeutic drug level monitoring: Secondary | ICD-10-CM | POA: Diagnosis not present

## 2019-05-17 DIAGNOSIS — Z953 Presence of xenogenic heart valve: Secondary | ICD-10-CM

## 2019-05-17 DIAGNOSIS — Z951 Presence of aortocoronary bypass graft: Secondary | ICD-10-CM

## 2019-05-17 DIAGNOSIS — I350 Nonrheumatic aortic (valve) stenosis: Secondary | ICD-10-CM | POA: Diagnosis not present

## 2019-05-17 LAB — POCT INR: INR: 3 (ref 2.0–3.0)

## 2019-05-17 MED ORDER — AMOXICILLIN 500 MG PO CAPS: ORAL_CAPSULE | ORAL | 1 refills | Status: DC

## 2019-05-17 NOTE — Patient Instructions (Signed)
Continue taking 7.5mg  (3 tabs) daily except for 5 mg (2 tabs) on Fridays. Recheck INR in 4 week. Coumadin Clinic [phone] Main [phone].

## 2019-05-17 NOTE — Patient Instructions (Signed)
Medication Instructions:  No changes *If you need a refill on your cardiac medications before your next appointment, please call your pharmacy*   Lab Work: none  Testing/Procedures: Your physician has requested that you have an echocardiogram. Echocardiography is a painless test that uses sound waves to create images of your heart. It provides your doctor with information about the size and shape of your heart and how well your heart's chambers and valves are working. This procedure takes approximately one hour. There are no restrictions for this procedure.   Follow-Up: At [facility], you and your health needs are our priority.  As part of our continuing mission to provide you with exceptional heart care, we have created designated Provider Care Teams.  These Care Teams include your primary Cardiologist (physician) and Advanced Practice Providers (APPs -  Physician Assistants and Nurse Practitioners) who all work together to provide you with the care you need, when you need it.  Your next appointment:   12 month(s)  The format for your next appointment:   In Person  Provider:   You may see Christopher McAlhany, MD or one of the following Advanced Practice Providers on your designated Care Team:    Dayna Dunn, PA-C  Michele Lenze, PA-C   Other Instructions   

## 2019-05-23 NOTE — Progress Notes (Signed)
  Radiation Oncology         [phone] ________________________________  Name: [name] MRN: [id number]  Date: 05/09/2019  DOB: 12/27/1950  3D Planning Note   Prostate Brachytherapy Post-Implant Dosimetry  Diagnosis:  69 y.o. gentleman with Stage T1c adenocarcinoma of the prostate with Gleason score of 3+4, and PSA of 5.64.  Narrative: On a previous date, [name] returned following prostate seed implantation for post implant planning. He underwent CT scan complex simulation to delineate the three-dimensional structures of the pelvis and demonstrate the radiation distribution.  Since that time, the seed localization, and complex isodose planning with dose volume histograms have now been completed.  Results:   Prostate Coverage - The dose of radiation delivered to the 90% or more of the prostate gland (D90) was 91.83% of the prescription dose. This exceeds our goal of greater than 90%. Rectal Sparing - The volume of rectal tissue receiving the prescription dose or higher was 0.0 cc. This falls under our thresholds tolerance of 1.0 cc.  Impression: The prostate seed implant appears to show adequate target coverage and appropriate rectal sparing.  Plan:  The patient will continue to follow with urology for ongoing PSA determinations. I would anticipate a high likelihood for local tumor control with minimal risk for rectal morbidity.  ________________________________  Sheral Apley Tammi Klippel, M.D.

## 2019-05-25 ENCOUNTER — Ambulatory Visit (INDEPENDENT_AMBULATORY_CARE_PROVIDER_SITE_OTHER): Payer: Medicare HMO | Admitting: *Deleted

## 2019-05-25 DIAGNOSIS — I442 Atrioventricular block, complete: Secondary | ICD-10-CM | POA: Diagnosis not present

## 2019-05-25 LAB — CUP PACEART REMOTE DEVICE CHECK
Battery Remaining Longevity: 77 mo
Battery Voltage: 3.02 V
Brady Statistic AP VP Percent: 0.02 %
Brady Statistic AP VS Percent: 0 %
Brady Statistic AS VP Percent: 97.69 %
Brady Statistic AS VS Percent: 2.29 %
Brady Statistic RA Percent Paced: 0.02 %
Brady Statistic RV Percent Paced: 97.71 %
Date Time Interrogation Session: 20210415195635
Implantable Lead Implant Date: 20200908
Implantable Lead Location: 753859
Implantable Lead Location: 753860
Implantable Lead Model: 3830
Implantable Lead Model: 5076
Implantable Pulse Generator Implant Date: 20200908
Lead Channel Impedance Value: 285 Ohm
Lead Channel Impedance Value: 304 Ohm
Lead Channel Impedance Value: 399 Ohm
Lead Channel Impedance Value: 456 Ohm
Lead Channel Pacing Threshold Amplitude: 0.625 V
Lead Channel Pacing Threshold Amplitude: 2 V
Lead Channel Pacing Threshold Pulse Width: 0.4 ms
Lead Channel Pacing Threshold Pulse Width: 0.4 ms
Lead Channel Sensing Intrinsic Amplitude: 10.875 mV
Lead Channel Sensing Intrinsic Amplitude: 10.875 mV
Lead Channel Sensing Intrinsic Amplitude: 2.625 mV
Lead Channel Sensing Intrinsic Amplitude: 2.625 mV
Lead Channel Setting Pacing Amplitude: 1.5 V
Lead Channel Setting Pacing Amplitude: 4 V
Lead Channel Setting Pacing Pulse Width: 0.4 ms
Lead Channel Setting Sensing Sensitivity: 1.2 mV

## 2019-05-25 NOTE — Progress Notes (Signed)
PPM Remote  

## 2019-05-28 ENCOUNTER — Telehealth: Payer: Self-pay | Admitting: Emergency Medicine

## 2019-05-28 NOTE — Telephone Encounter (Signed)
Patient scheduled for device clinic check 05/29/19 at 1530 to assess RV threshold.

## 2019-05-28 NOTE — Telephone Encounter (Signed)
LMOM. Need to do in clinic check on device due to increasing RV threshold.

## 2019-05-28 NOTE — Telephone Encounter (Signed)
Pt left message for [place]. His phone number is [phone].

## 2019-05-29 ENCOUNTER — Ambulatory Visit (INDEPENDENT_AMBULATORY_CARE_PROVIDER_SITE_OTHER): Payer: Medicare HMO | Admitting: *Deleted

## 2019-05-29 ENCOUNTER — Ambulatory Visit
Admission: RE | Admit: 2019-05-29 | Discharge: 2019-05-29 | Disposition: A | Discharge status: Home / Self Care | Payer: Medicare HMO | Source: Ambulatory Visit | Attending: Cardiology | Admitting: Cardiology

## 2019-05-29 ENCOUNTER — Other Ambulatory Visit: Payer: Self-pay

## 2019-05-29 DIAGNOSIS — I700 Atherosclerosis of aorta: Secondary | ICD-10-CM | POA: Diagnosis not present

## 2019-05-29 DIAGNOSIS — Z950 Presence of cardiac pacemaker: Secondary | ICD-10-CM | POA: Diagnosis not present

## 2019-05-29 DIAGNOSIS — I442 Atrioventricular block, complete: Secondary | ICD-10-CM | POA: Diagnosis not present

## 2019-05-29 DIAGNOSIS — Z951 Presence of aortocoronary bypass graft: Secondary | ICD-10-CM | POA: Diagnosis not present

## 2019-05-29 LAB — CUP PACEART INCLINIC DEVICE CHECK
Battery Remaining Longevity: 96 mo
Battery Voltage: 3.02 V
Brady Statistic AP VP Percent: 0.02 %
Brady Statistic AP VS Percent: 0 %
Brady Statistic AS VP Percent: 97.4 %
Brady Statistic AS VS Percent: 2.58 %
Brady Statistic RA Percent Paced: 0.02 %
Brady Statistic RV Percent Paced: 97.42 %
Date Time Interrogation Session: 20210420172046
Implantable Lead Implant Date: 20200908
Implantable Lead Implant Date: 20200908
Implantable Lead Location: 753859
Implantable Lead Location: 753860
Implantable Lead Model: 3830
Implantable Lead Model: 5076
Implantable Pulse Generator Implant Date: 20200908
Lead Channel Impedance Value: 304 Ohm
Lead Channel Impedance Value: 323 Ohm
Lead Channel Impedance Value: 437 Ohm
Lead Channel Impedance Value: 494 Ohm
Lead Channel Pacing Threshold Amplitude: 0.75 V
Lead Channel Pacing Threshold Amplitude: 1.25 V
Lead Channel Pacing Threshold Pulse Width: 0.4 ms
Lead Channel Pacing Threshold Pulse Width: 0.8 ms
Lead Channel Sensing Intrinsic Amplitude: 3 mV
Lead Channel Sensing Intrinsic Amplitude: 8.875 mV
Lead Channel Setting Pacing Amplitude: 1.5 V
Lead Channel Setting Pacing Amplitude: 2.5 V
Lead Channel Setting Pacing Pulse Width: 0.8 ms
Lead Channel Setting Sensing Sensitivity: 1.2 mV

## 2019-05-29 NOTE — Progress Notes (Signed)
Pacemaker check in clinic, added-on for manual RV threshold testing. Normal device function. RA thresholds, sensing, and impedances consistent with previous measurements. RV threshold now 1.75V @ 0.71ms and 1.25V @ 0.60ms. RV output reprogrammed to 2.5V @ 0.24ms with capture management on monitor. RV pseudofusion noted on presentation, conducting 1:1 for duration of visit, LBB pacing noted on EKG with PAV/SAV shortened to 140/112ms. Discussed with Dr. Rayann Heman via phone and Medtronic representative, received verbal order from Dr. Rayann Heman to enable MVP mode with PAV/SAV at 140/161ms. Device programmed to maximize longevity. No mode switches. 6 NSVT episodes, longest 9 beats. Device programmed at appropriate safety margins. Histogram distribution right-shifted, consistent with previous checks. Device programmed to optimize intrinsic conduction. Estimated longevity 7.9 years. Patient enrolled in remote follow-up. Patient education completed. CXR ordered per Dr. Rayann Heman, patient aware to call if he feels poorly after reprogramming. [facility] on 08/24/19 and ROV with Tommye Standard, PA-C in 01/2020.

## 2019-06-13 ENCOUNTER — Ambulatory Visit (HOSPITAL_COMMUNITY): Payer: Medicare HMO | Attending: Cardiology

## 2019-06-13 ENCOUNTER — Other Ambulatory Visit: Payer: Self-pay

## 2019-06-13 DIAGNOSIS — I350 Nonrheumatic aortic (valve) stenosis: Secondary | ICD-10-CM

## 2019-06-13 DIAGNOSIS — Z953 Presence of xenogenic heart valve: Secondary | ICD-10-CM | POA: Insufficient documentation

## 2019-06-13 DIAGNOSIS — I4892 Unspecified atrial flutter: Secondary | ICD-10-CM

## 2019-06-13 DIAGNOSIS — I2510 Atherosclerotic heart disease of native coronary artery without angina pectoris: Secondary | ICD-10-CM | POA: Insufficient documentation

## 2019-06-13 DIAGNOSIS — I442 Atrioventricular block, complete: Secondary | ICD-10-CM | POA: Diagnosis not present

## 2019-06-13 MED ORDER — PERFLUTREN LIPID MICROSPHERE
1.0000 mL | INTRAVENOUS | Status: AC | PRN
  Administered 2019-06-13: 2 mL via INTRAVENOUS

## 2019-06-14 ENCOUNTER — Ambulatory Visit (INDEPENDENT_AMBULATORY_CARE_PROVIDER_SITE_OTHER): Payer: Medicare HMO | Admitting: *Deleted

## 2019-06-14 DIAGNOSIS — Z951 Presence of aortocoronary bypass graft: Secondary | ICD-10-CM

## 2019-06-14 DIAGNOSIS — Z8774 Personal history of (corrected) congenital malformations of heart and circulatory system: Secondary | ICD-10-CM

## 2019-06-14 DIAGNOSIS — Z5181 Encounter for therapeutic drug level monitoring: Secondary | ICD-10-CM | POA: Diagnosis not present

## 2019-06-14 DIAGNOSIS — Z953 Presence of xenogenic heart valve: Secondary | ICD-10-CM | POA: Diagnosis not present

## 2019-06-14 LAB — POCT INR: INR: 3.3 — AB (ref 2.0–3.0)

## 2019-06-14 MED ORDER — WARFARIN SODIUM 2.5 MG PO TABS: ORAL_TABLET | ORAL | 0 refills | Status: DC

## 2019-06-14 NOTE — Patient Instructions (Addendum)
Description   Hold today, and then continue taking 7.5mg  (3 tabs) daily except for 5 mg (2 tabs) on Fridays. Recheck INR in 4  weeks. Coumadin Clinic [phone] Main [phone].

## 2019-06-25 DIAGNOSIS — C61 Malignant neoplasm of prostate: Secondary | ICD-10-CM | POA: Diagnosis not present

## 2019-07-02 DIAGNOSIS — C61 Malignant neoplasm of prostate: Secondary | ICD-10-CM | POA: Diagnosis not present

## 2019-07-12 ENCOUNTER — Other Ambulatory Visit: Payer: Self-pay

## 2019-07-12 ENCOUNTER — Ambulatory Visit (INDEPENDENT_AMBULATORY_CARE_PROVIDER_SITE_OTHER): Payer: Medicare HMO | Admitting: Pharmacist

## 2019-07-12 ENCOUNTER — Other Ambulatory Visit: Payer: Self-pay | Admitting: Cardiovascular Disease

## 2019-07-12 DIAGNOSIS — Z953 Presence of xenogenic heart valve: Secondary | ICD-10-CM | POA: Diagnosis not present

## 2019-07-12 DIAGNOSIS — Z951 Presence of aortocoronary bypass graft: Secondary | ICD-10-CM | POA: Diagnosis not present

## 2019-07-12 DIAGNOSIS — Z8774 Personal history of (corrected) congenital malformations of heart and circulatory system: Secondary | ICD-10-CM

## 2019-07-12 DIAGNOSIS — Z5181 Encounter for therapeutic drug level monitoring: Secondary | ICD-10-CM | POA: Diagnosis not present

## 2019-07-12 LAB — POCT INR: INR: 2.3 (ref 2.0–3.0)

## 2019-07-12 NOTE — Patient Instructions (Signed)
Description   Continue taking 7.5mg  (3 tabs) daily except for 5 mg (2 tabs) on Fridays. Recheck INR in 4  weeks. Coumadin Clinic [phone] Main [phone].

## 2019-08-09 ENCOUNTER — Other Ambulatory Visit: Payer: Self-pay

## 2019-08-09 ENCOUNTER — Ambulatory Visit (INDEPENDENT_AMBULATORY_CARE_PROVIDER_SITE_OTHER): Payer: Medicare HMO

## 2019-08-09 DIAGNOSIS — Z951 Presence of aortocoronary bypass graft: Secondary | ICD-10-CM | POA: Diagnosis not present

## 2019-08-09 DIAGNOSIS — Z953 Presence of xenogenic heart valve: Secondary | ICD-10-CM

## 2019-08-09 DIAGNOSIS — Z5181 Encounter for therapeutic drug level monitoring: Secondary | ICD-10-CM | POA: Diagnosis not present

## 2019-08-09 DIAGNOSIS — Z8774 Personal history of (corrected) congenital malformations of heart and circulatory system: Secondary | ICD-10-CM

## 2019-08-09 LAB — POCT INR: INR: 2 (ref 2.0–3.0)

## 2019-08-09 NOTE — Patient Instructions (Signed)
Continue taking 7.5mg  (3 tabs) daily except for 5 mg (2 tabs) on Fridays. Recheck INR in 4  weeks. Coumadin Clinic [phone] Main [phone].

## 2019-08-24 ENCOUNTER — Ambulatory Visit (INDEPENDENT_AMBULATORY_CARE_PROVIDER_SITE_OTHER): Payer: Medicare HMO | Admitting: *Deleted

## 2019-08-24 DIAGNOSIS — I442 Atrioventricular block, complete: Secondary | ICD-10-CM

## 2019-08-24 LAB — CUP PACEART REMOTE DEVICE CHECK
Battery Remaining Longevity: 109 mo
Battery Voltage: 3.05 V
Brady Statistic AP VP Percent: 0.01 %
Brady Statistic AP VS Percent: 0.08 %
Brady Statistic AS VP Percent: 0.07 %
Brady Statistic AS VS Percent: 99.84 %
Brady Statistic RA Percent Paced: 0.09 %
Brady Statistic RV Percent Paced: 0.08 %
Date Time Interrogation Session: 20210715203458
Implantable Lead Implant Date: 20200908
Implantable Lead Implant Date: 20200908
Implantable Lead Location: 753859
Implantable Lead Location: 753860
Implantable Lead Model: 3830
Implantable Lead Model: 5076
Implantable Pulse Generator Implant Date: 20200908
Lead Channel Impedance Value: 266 Ohm
Lead Channel Impedance Value: 285 Ohm
Lead Channel Impedance Value: 380 Ohm
Lead Channel Impedance Value: 494 Ohm
Lead Channel Pacing Threshold Amplitude: 0.625 V
Lead Channel Pacing Threshold Amplitude: 2.125 V
Lead Channel Pacing Threshold Pulse Width: 0.4 ms
Lead Channel Pacing Threshold Pulse Width: 0.4 ms
Lead Channel Sensing Intrinsic Amplitude: 2.5 mV
Lead Channel Sensing Intrinsic Amplitude: 2.5 mV
Lead Channel Sensing Intrinsic Amplitude: 5.625 mV
Lead Channel Sensing Intrinsic Amplitude: 5.625 mV
Lead Channel Setting Pacing Amplitude: 1.5 V
Lead Channel Setting Pacing Amplitude: 2.5 V
Lead Channel Setting Pacing Pulse Width: 0.8 ms
Lead Channel Setting Sensing Sensitivity: 1.2 mV

## 2019-08-28 NOTE — Progress Notes (Signed)
Remote pacemaker transmission.   

## 2019-09-06 ENCOUNTER — Ambulatory Visit (INDEPENDENT_AMBULATORY_CARE_PROVIDER_SITE_OTHER): Payer: Medicare HMO

## 2019-09-06 ENCOUNTER — Other Ambulatory Visit: Payer: Self-pay

## 2019-09-06 DIAGNOSIS — Z953 Presence of xenogenic heart valve: Secondary | ICD-10-CM | POA: Diagnosis not present

## 2019-09-06 DIAGNOSIS — Z5181 Encounter for therapeutic drug level monitoring: Secondary | ICD-10-CM | POA: Diagnosis not present

## 2019-09-06 DIAGNOSIS — Z8774 Personal history of (corrected) congenital malformations of heart and circulatory system: Secondary | ICD-10-CM

## 2019-09-06 DIAGNOSIS — Z951 Presence of aortocoronary bypass graft: Secondary | ICD-10-CM

## 2019-09-06 LAB — POCT INR: INR: 1.9 — AB (ref 2.0–3.0)

## 2019-09-06 NOTE — Patient Instructions (Signed)
Description   Take 10mg  (4 tablets) today, then resume same dosage 7.5mg  (3 tabs) daily except for 5 mg (2 tabs) on Fridays. Recheck INR in 4 weeks. Coumadin Clinic [phone] Main [phone].

## 2019-10-04 ENCOUNTER — Other Ambulatory Visit: Payer: Self-pay

## 2019-10-04 ENCOUNTER — Ambulatory Visit (INDEPENDENT_AMBULATORY_CARE_PROVIDER_SITE_OTHER): Payer: Medicare HMO | Admitting: Pharmacist

## 2019-10-04 DIAGNOSIS — Z8774 Personal history of (corrected) congenital malformations of heart and circulatory system: Secondary | ICD-10-CM | POA: Diagnosis not present

## 2019-10-04 DIAGNOSIS — Z5181 Encounter for therapeutic drug level monitoring: Secondary | ICD-10-CM

## 2019-10-04 DIAGNOSIS — Z951 Presence of aortocoronary bypass graft: Secondary | ICD-10-CM

## 2019-10-04 DIAGNOSIS — Z953 Presence of xenogenic heart valve: Secondary | ICD-10-CM

## 2019-10-04 LAB — POCT INR: INR: 1.9 — AB (ref 2.0–3.0)

## 2019-10-04 MED ORDER — WARFARIN SODIUM 7.5 MG PO TABS: 7.5000 mg | ORAL_TABLET | Freq: Every day | ORAL | 2 refills | Status: DC

## 2019-10-04 NOTE — Patient Instructions (Signed)
Description   Take 3.5 tablets today of your 2.5mg  dose, then start taking 7.5mg  daily (change to the 7.5mg  tablet strength when you use up your 2.5mg  tablets). Recheck INR in 4 weeks. Coumadin Clinic [phone] Main [phone].

## 2019-11-01 ENCOUNTER — Other Ambulatory Visit: Payer: Self-pay

## 2019-11-01 ENCOUNTER — Ambulatory Visit (INDEPENDENT_AMBULATORY_CARE_PROVIDER_SITE_OTHER): Payer: Medicare HMO | Admitting: *Deleted

## 2019-11-01 DIAGNOSIS — Z8774 Personal history of (corrected) congenital malformations of heart and circulatory system: Secondary | ICD-10-CM | POA: Diagnosis not present

## 2019-11-01 DIAGNOSIS — Z5181 Encounter for therapeutic drug level monitoring: Secondary | ICD-10-CM | POA: Diagnosis not present

## 2019-11-01 DIAGNOSIS — Z953 Presence of xenogenic heart valve: Secondary | ICD-10-CM

## 2019-11-01 DIAGNOSIS — Z951 Presence of aortocoronary bypass graft: Secondary | ICD-10-CM

## 2019-11-01 LAB — POCT INR: INR: 3.2 — AB (ref 2.0–3.0)

## 2019-11-01 NOTE — Patient Instructions (Signed)
Description   Today take 3.75mg  (1/2 tablet) then continue taking Warfarin 7.5mg  daily. Recheck INR in 4 weeks. Coumadin Clinic [phone] Main [phone].

## 2019-11-05 ENCOUNTER — Ambulatory Visit: Payer: Medicare HMO | Admitting: Thoracic Surgery (Cardiothoracic Vascular Surgery)

## 2019-11-05 ENCOUNTER — Encounter: Payer: Self-pay | Admitting: Thoracic Surgery (Cardiothoracic Vascular Surgery)

## 2019-11-05 ENCOUNTER — Other Ambulatory Visit: Payer: Self-pay

## 2019-11-05 VITALS — BP 140/86 | HR 100 | Temp 97.7°F | Resp 20 | Ht 67.75 in | Wt 245.0 lb

## 2019-11-05 DIAGNOSIS — Z953 Presence of xenogenic heart valve: Secondary | ICD-10-CM

## 2019-11-05 DIAGNOSIS — Z951 Presence of aortocoronary bypass graft: Secondary | ICD-10-CM

## 2019-11-05 NOTE — Progress Notes (Addendum)
[address]       ,[place] [address]             [phone]     CARDIOTHORACIC SURGERY OFFICE NOTE  Primary Cardiologist is Lauree Chandler, MD PCP is Denita Lung, MD   HPI:  Patient is a 69 year old moderately obese male with history of aortic stenosis, mitral stenosis, hyperlipidemia, previous thymectomy with radiation therapy to the chest, prostate cancer, and longstanding tobacco abuse who returns to the office today for routine follow-up approximately 1 year status post aortic and mitral valve replacement using bioprosthetic tissue valve with coronary artery bypass grafting x2, closure of patent foramen ovale and clipping of left atrial appendage on October 11, 2018 for radiation-induced valvular heart disease with severe aortic stenosis, moderate aortic insufficiency, moderate mitral stenosis with moderate to severe mitral regurgitation, and multivessel coronary artery disease.  The patient's early postoperative recovery was notable for the development of complete heart block for which he underwent permanent pacemaker placement.  He also developed atrial flutter and was anticoagulated using warfarin.  He was last seen here in our office on January 08, 2019 at which time he was doing fairly well.  He later developed atrial flutter but spontaneously converted out of atrial flutter prior to DC cardioversion and he was seen in follow-up by Dr. Angelena Form on May 17, 2019 at which time he was maintaining sinus rhythm.  Shortly after that he underwent follow-up echocardiogram which revealed normal left ventricular systolic function with normal functioning bioprosthetic tissue valve in the aortic and mitral positions.  Patient returns her office today for routine follow-up.  Patient reports he is doing very well.  He states that overall he feels "much improved" in comparison with how he felt prior to surgery.  He is reasonably active physically, walking 2-1/2 to 3  miles a day.  He states he still gets some degree of exertional shortness of breath but he is only slightly limited and he feels much better than he did prior to surgery.  He never gets any chest pain or chest tightness.  He reports no other significant physical limitations.  He has not had palpitations or other symptoms to suggest a recurrence of atrial fibrillation or atrial flutter.  He remains anticoagulated using warfarin.   Current Outpatient Medications  Medication Sig Dispense Refill  . amoxicillin (AMOXIL) 500 MG capsule Take 4 tablets (2000 mg) by mouth ONE HOUR PRIOR TO ALL DENTAL PROCEDURES 12 capsule 1  . aspirin 81 MG tablet Take 81 mg by mouth every other day.     Marland Kitchen atorvastatin (LIPITOR) 20 MG tablet Take 1 tablet (20 mg total) by mouth daily at 6 PM. 90 tablet 3  . calcium carbonate (TUMS - DOSED IN MG ELEMENTAL CALCIUM) 500 MG chewable tablet Chew 1 tablet by mouth as needed for indigestion or heartburn.    . fluorouracil (EFUDEX) 5 % cream Apply topically daily. For 4 weeks as tolerated 40 g 0  . metoprolol tartrate (LOPRESSOR) 25 MG tablet Take 1 tablet (25 mg total) by mouth 2 (two) times daily. 180 tablet 3  . oxymetazoline (AFRIN) 0.05 % nasal spray Place 1 spray into both nostrils at bedtime as needed for congestion.    Marland Kitchen warfarin (COUMADIN) 7.5 MG tablet Take 1 tablet (7.5 mg total) by mouth daily. 35 tablet 2   No current facility-administered medications for this visit.      Physical Exam:   BP 140/86  Pulse 100   Temp 97.7 F (36.5 C) (Skin)   Resp 20   Ht 5' 7.75" (1.721 m)   Wt 245 lb (111.1 kg)   SpO2 95% Comment: ra  BMI 37.53 kg/m   General:  Well-appearing  Chest:   Clear to auscultation  CV:   Regular rate and rhythm without murmur  Incisions:  Completely healed, sternum is stable  Abdomen:  Soft nontender  Extremities:  Warm and well-perfused  Diagnostic Tests:  ECHOCARDIOGRAM REPORT       Patient Name:  Carolan Clines Date of Exam:  06/13/2019  Medical Rec #: [id number]   Height:    67.8 in  Accession #:  [id number]   Weight:    237.4 lb  Date of Birth: 16-May-1950   BSA:     2.193 m  Patient Age:  57 years    BP:      118/70 mmHg  Patient Gender: M       HR:      83 bpm.  Exam Location: [place]   Procedure: 2D Echo, Cardiac Doppler, Color Doppler and Intracardiac       Opacification Agent   Indications:  I35.0 Aortic Stenosis    History:    Patient has prior history of Echocardiogram examinations,  most         recent 06/27/2018. Pacemaker, Prior CABG and AVR-23 mm  Edwards         inspiris Resilia Bovine Pericardial Tissue Valve; MVR-  64mm         Medtronic mosaic Porcine Bioprosthetic Tissue Valve; Risk         Factors:Dyslipidemia and Former Smoker. Coronary artery  disease.         Bradycardia.         Aortic Valve: 23 mm Edwards bovine valve is present in the         aortic position. Procedure Date: 10/11/2018.         Mitral Valve: 27 mm Medtronic bioprosthetic valve valve is         present in the mitral position. Procedure Date: 10/11/2018.    Sonographer:  Wilford Sports Rodgers-Jones RDCS  Referring Phys: [place]     Sonographer Comments: Suboptimal apical window.  IMPRESSIONS    1. Left ventricular ejection fraction, by estimation, is 60 to 65%. The  left ventricle has normal function. The left ventricle has no regional  wall motion abnormalities. There is mild concentric left ventricular  hypertrophy. Left ventricular diastolic  function could not be evaluated.  2. Right ventricular systolic function is moderately reduced. The right  ventricular size is moderately enlarged. There is normal pulmonary artery  systolic pressure.  3. Left atrial size was mildly dilated.  4. Right atrial size was mildly dilated.  5. The mitral valve  has been repaired/replaced. No evidence of mitral  valve regurgitation. There is a 27 mm Medtronic bioprosthetic valve  present in the mitral position. Procedure Date: 10/11/2018. Echo findings  are consistent with normal structure and  function of the mitral valve prosthesis.  6. The tricuspid valve is abnormal. Tricuspid valve regurgitation is  moderate.  7. The aortic valve has been repaired/replaced. Aortic valve  regurgitation is not visualized. There is a 23 mm Edwards bovine valve  present in the aortic position. Procedure Date: 10/11/2018. Echo findings  are consistent with normal structure and function  of the aortic valve prosthesis.  8. The inferior vena cava is normal in size with <  50% respiratory  variability, suggesting right atrial pressure of 8 mmHg.   Conclusion(s)/Recommendation(s): S/P bioprosthetic AVR and MVR. Valves  appear well seated, without significant regurgitation. No other echo post  op to compare to.   FINDINGS  Left Ventricle: Mobile mass consistent with resected papillary muscle.  Left ventricular ejection fraction, by estimation, is 60 to 65%. The left  ventricle has normal function. The left ventricle has no regional wall  motion abnormalities. Definity  contrast agent was given IV to delineate the left ventricular endocardial  borders. The left ventricular internal cavity size was normal in size.  There is mild concentric left ventricular hypertrophy. Abnormal  (paradoxical) septal motion consistent with  post-operative status. Left ventricular diastolic function could not be  evaluated due to mitral valve replacement. Left ventricular diastolic  function could not be evaluated.   Right Ventricle: The right ventricular size is moderately enlarged. Right  vetricular wall thickness was not assessed. Right ventricular systolic  function is moderately reduced. There is normal pulmonary artery systolic  pressure. The tricuspid regurgitant  velocity  is 2.29 m/s, and with an assumed right atrial pressure of 8  mmHg, the estimated right ventricular systolic pressure is 47.8 mmHg.   Left Atrium: Left atrial size was mildly dilated.   Right Atrium: Right atrial size was mildly dilated.   Pericardium: There is no evidence of pericardial effusion.   Mitral Valve: The mitral valve has been repaired/replaced. No evidence of  mitral valve regurgitation. There is a 27 mm Medtronic bioprosthetic valve  present in the mitral position. Procedure Date: 10/11/2018. Echo findings  are consistent with normal  structure and function of the mitral valve prosthesis. MV peak gradient,  10.6 mmHg. The mean mitral valve gradient is 6.5 mmHg.   Tricuspid Valve: The tricuspid valve is abnormal. Tricuspid valve  regurgitation is moderate . No evidence of tricuspid stenosis.   Aortic Valve: The aortic valve has been repaired/replaced. Aortic valve  regurgitation is not visualized. Aortic valve mean gradient measures 9.5  mmHg. Aortic valve peak gradient measures 15.1 mmHg. Aortic valve area, by  VTI measures 1.59 cm. There is a  23 mm Edwards bovine valve present in the aortic position. Procedure Date:  10/11/2018. Echo findings are consistent with normal structure and function  of the aortic valve prosthesis.   Pulmonic Valve: The pulmonic valve was grossly normal. Pulmonic valve  regurgitation is trivial. No evidence of pulmonic stenosis.   Aorta: The aortic root, ascending aorta and aortic arch are all  structurally normal, with no evidence of dilitation or obstruction.   Venous: The inferior vena cava is normal in size with less than 50%  respiratory variability, suggesting right atrial pressure of 8 mmHg.   IAS/Shunts: No atrial level shunt detected by color flow Doppler.   Additional Comments: A pacer wire is visualized in the right atrium and  right ventricle.     LEFT VENTRICLE  PLAX 2D  LVIDd:     3.20 cm Diastology  LVIDs:      2.30 cm LV e' lateral:  7.04 cm/s  LV PW:     1.20 cm LV E/e' lateral: 20.6  LV IVS:    1.20 cm LV e' medial:  4.70 cm/s  LVOT diam:   2.20 cm LV E/e' medial: 30.9  LV SV:     60  LV SV Index:  27  LVOT Area:   3.80 cm     RIGHT VENTRICLE  RV Basal diam: 5.10 cm  RV  S prime:   6.48 cm/s  TAPSE (M-mode): 1.1 cm   LEFT ATRIUM       Index    RIGHT ATRIUM      Index  LA diam:    4.90 cm 2.23 cm/m RA Area:   19.80 cm  LA Vol (A2C):  93.5 ml 42.64 ml/m RA Volume:  64.70 ml 29.51 ml/m  LA Vol (A4C):  73.6 ml 33.57 ml/m  LA Biplane Vol: 85.5 ml 38.99 ml/m  AORTIC VALVE  AV Area (Vmax):  1.55 cm  AV Area (Vmean):  1.62 cm  AV Area (VTI):   1.59 cm  AV Vmax:      194.25 cm/s  AV Vmean:     146.250 cm/s  AV VTI:      0.376 m  AV Peak Grad:   15.1 mmHg  AV Mean Grad:   9.5 mmHg  LVOT Vmax:     79.25 cm/s  LVOT Vmean:    62.250 cm/s  LVOT VTI:     0.157 m  LVOT/AV VTI ratio: 0.42    AORTA  Ao Root diam: 3.20 cm  Ao Asc diam: 3.60 cm   MITRAL VALVE        TRICUSPID VALVE  MV Area (PHT): 2.48 cm   TR Peak grad:  21.0 mmHg  MV Peak grad: 10.6 mmHg  TR Vmax:    229.00 cm/s  MV Mean grad: 6.5 mmHg  MV Vmax:    1.63 m/s   SHUNTS  MV Vmean:   118.0 cm/s  Systemic VTI: 0.16 m  MV Decel Time: 306 msec   Systemic Diam: 2.20 cm  MV E velocity: 145.00 cm/s  MV A velocity: 155.00 cm/s  MV E/A ratio: 0.94   Buford Dresser MD  Electronically signed by Buford Dresser MD  Signature Date/Time: 06/13/2019/2:27:28 PM      Impression:  Patient is doing very well approximately 1 year status post aortic and mitral valve replacement using bioprosthetic tissue valves with coronary artery bypass grafting x2, closure of patent foramen ovale, and clipping of left atrial appendage.  Plan:  We have not recommended any change the patient's current  medications.  We will defer whether or not the patient remains on warfarin for long-term anticoagulation is Dr. Angelena Form and Dr. Rayann Heman.  The patient has been reminded regarding the importance of dental hygiene and the lifelong need for antibiotic prophylaxis for all dental cleanings and other related invasive procedures.  The patient will return to our office in the future only should specific problems or questions arise.  I spent in excess of 15 minutes during the conduct of this office consultation and >50% of this time involved direct face-to-face encounter with the patient for counseling and/or coordination of their care.    Valentina Gu. Roxy Manns, MD 11/05/2019 1:30 PM

## 2019-11-05 NOTE — Patient Instructions (Signed)

## 2019-11-22 LAB — CUP PACEART REMOTE DEVICE CHECK
Battery Remaining Longevity: 117 mo
Battery Voltage: 3.04 V
Brady Statistic AP VP Percent: 0.01 %
Brady Statistic AP VS Percent: 0.47 %
Brady Statistic AS VP Percent: 0.04 %
Brady Statistic AS VS Percent: 99.47 %
Brady Statistic RA Percent Paced: 0.61 %
Brady Statistic RV Percent Paced: 0.05 %
Date Time Interrogation Session: 20211013235703
Implantable Lead Implant Date: 20200908
Implantable Lead Implant Date: 20200908
Implantable Lead Location: 753859
Implantable Lead Location: 753860
Implantable Lead Model: 3830
Implantable Lead Model: 5076
Implantable Pulse Generator Implant Date: 20200908
Lead Channel Impedance Value: 285 Ohm
Lead Channel Impedance Value: 304 Ohm
Lead Channel Impedance Value: 380 Ohm
Lead Channel Impedance Value: 513 Ohm
Lead Channel Pacing Threshold Amplitude: 0.5 V
Lead Channel Pacing Threshold Amplitude: 2.125 V
Lead Channel Pacing Threshold Pulse Width: 0.4 ms
Lead Channel Pacing Threshold Pulse Width: 0.4 ms
Lead Channel Sensing Intrinsic Amplitude: 1.75 mV
Lead Channel Sensing Intrinsic Amplitude: 1.75 mV
Lead Channel Sensing Intrinsic Amplitude: 10.625 mV
Lead Channel Sensing Intrinsic Amplitude: 10.625 mV
Lead Channel Setting Pacing Amplitude: 1.5 V
Lead Channel Setting Pacing Amplitude: 2.5 V
Lead Channel Setting Pacing Pulse Width: 0.8 ms
Lead Channel Setting Sensing Sensitivity: 1.2 mV

## 2019-11-23 ENCOUNTER — Ambulatory Visit (INDEPENDENT_AMBULATORY_CARE_PROVIDER_SITE_OTHER): Payer: Medicare HMO

## 2019-11-23 DIAGNOSIS — I442 Atrioventricular block, complete: Secondary | ICD-10-CM | POA: Diagnosis not present

## 2019-11-28 NOTE — Progress Notes (Signed)
Remote pacemaker transmission.   

## 2019-11-29 ENCOUNTER — Other Ambulatory Visit: Payer: Self-pay

## 2019-11-29 ENCOUNTER — Ambulatory Visit (INDEPENDENT_AMBULATORY_CARE_PROVIDER_SITE_OTHER): Payer: Medicare HMO

## 2019-11-29 DIAGNOSIS — Z8774 Personal history of (corrected) congenital malformations of heart and circulatory system: Secondary | ICD-10-CM | POA: Diagnosis not present

## 2019-11-29 DIAGNOSIS — Z953 Presence of xenogenic heart valve: Secondary | ICD-10-CM | POA: Diagnosis not present

## 2019-11-29 DIAGNOSIS — Z5181 Encounter for therapeutic drug level monitoring: Secondary | ICD-10-CM

## 2019-11-29 DIAGNOSIS — Z951 Presence of aortocoronary bypass graft: Secondary | ICD-10-CM

## 2019-11-29 LAB — POCT INR: INR: 2.4 (ref 2.0–3.0)

## 2019-11-29 NOTE — Patient Instructions (Signed)
Description   Continue on same dosage Warfarin 7.5mg  daily. Recheck INR in 4 weeks. [facility] [phone] Main [phone].

## 2019-12-17 ENCOUNTER — Other Ambulatory Visit: Payer: Self-pay | Admitting: Physician Assistant

## 2019-12-27 ENCOUNTER — Other Ambulatory Visit: Payer: Self-pay

## 2019-12-27 ENCOUNTER — Ambulatory Visit (INDEPENDENT_AMBULATORY_CARE_PROVIDER_SITE_OTHER): Payer: Medicare HMO | Admitting: *Deleted

## 2019-12-27 DIAGNOSIS — Z8774 Personal history of (corrected) congenital malformations of heart and circulatory system: Secondary | ICD-10-CM

## 2019-12-27 DIAGNOSIS — Z5181 Encounter for therapeutic drug level monitoring: Secondary | ICD-10-CM

## 2019-12-27 DIAGNOSIS — Z951 Presence of aortocoronary bypass graft: Secondary | ICD-10-CM | POA: Diagnosis not present

## 2019-12-27 DIAGNOSIS — Z953 Presence of xenogenic heart valve: Secondary | ICD-10-CM | POA: Diagnosis not present

## 2019-12-27 LAB — POCT INR: INR: 3 (ref 2.0–3.0)

## 2019-12-27 NOTE — Patient Instructions (Signed)
Description   Continue taking Warfarin 7.5mg  daily. Recheck INR in 4 weeks. [facility] [phone] Main [phone].

## 2020-01-02 DIAGNOSIS — C61 Malignant neoplasm of prostate: Secondary | ICD-10-CM | POA: Diagnosis not present

## 2020-01-14 ENCOUNTER — Other Ambulatory Visit: Payer: Self-pay | Admitting: Cardiovascular Disease

## 2020-01-17 ENCOUNTER — Encounter: Payer: Self-pay | Admitting: Family Medicine

## 2020-01-23 ENCOUNTER — Other Ambulatory Visit: Payer: Self-pay | Admitting: Cardiovascular Disease

## 2020-01-24 ENCOUNTER — Other Ambulatory Visit: Payer: Self-pay

## 2020-01-24 ENCOUNTER — Ambulatory Visit (INDEPENDENT_AMBULATORY_CARE_PROVIDER_SITE_OTHER): Payer: Medicare HMO | Admitting: *Deleted

## 2020-01-24 DIAGNOSIS — Z951 Presence of aortocoronary bypass graft: Secondary | ICD-10-CM

## 2020-01-24 DIAGNOSIS — Z8774 Personal history of (corrected) congenital malformations of heart and circulatory system: Secondary | ICD-10-CM

## 2020-01-24 DIAGNOSIS — Z953 Presence of xenogenic heart valve: Secondary | ICD-10-CM

## 2020-01-24 DIAGNOSIS — Z5181 Encounter for therapeutic drug level monitoring: Secondary | ICD-10-CM | POA: Diagnosis not present

## 2020-01-24 LAB — POCT INR: INR: 2.1 (ref 2.0–3.0)

## 2020-01-24 NOTE — Patient Instructions (Signed)
Description   Continue taking Warfarin 7.5mg daily. Recheck INR in 6 weeks. [facility] [phone] Main [phone].     

## 2020-02-22 ENCOUNTER — Ambulatory Visit (INDEPENDENT_AMBULATORY_CARE_PROVIDER_SITE_OTHER): Payer: Medicare HMO

## 2020-02-22 DIAGNOSIS — I442 Atrioventricular block, complete: Secondary | ICD-10-CM

## 2020-02-22 LAB — CUP PACEART REMOTE DEVICE CHECK
Battery Remaining Longevity: 121 mo
Battery Voltage: 3.04 V
Brady Statistic AP VP Percent: 0.01 %
Brady Statistic AP VS Percent: 0.36 %
Brady Statistic AS VP Percent: 0.05 %
Brady Statistic AS VS Percent: 99.59 %
Brady Statistic RA Percent Paced: 0.49 %
Brady Statistic RV Percent Paced: 0.06 %
Date Time Interrogation Session: 20220113220659
Implantable Lead Implant Date: 20200908
Implantable Lead Implant Date: 20200908
Implantable Lead Location: 753859
Implantable Lead Location: 753860
Implantable Lead Model: 3830
Implantable Lead Model: 5076
Implantable Pulse Generator Implant Date: 20200908
Lead Channel Impedance Value: 266 Ohm
Lead Channel Impedance Value: 304 Ohm
Lead Channel Impedance Value: 361 Ohm
Lead Channel Impedance Value: 513 Ohm
Lead Channel Pacing Threshold Amplitude: 0.5 V
Lead Channel Pacing Threshold Amplitude: 1.875 V
Lead Channel Pacing Threshold Pulse Width: 0.4 ms
Lead Channel Pacing Threshold Pulse Width: 0.4 ms
Lead Channel Sensing Intrinsic Amplitude: 1.875 mV
Lead Channel Sensing Intrinsic Amplitude: 1.875 mV
Lead Channel Sensing Intrinsic Amplitude: 9.625 mV
Lead Channel Sensing Intrinsic Amplitude: 9.625 mV
Lead Channel Setting Pacing Amplitude: 1.5 V
Lead Channel Setting Pacing Amplitude: 2.5 V
Lead Channel Setting Pacing Pulse Width: 0.8 ms
Lead Channel Setting Sensing Sensitivity: 1.2 mV

## 2020-03-06 ENCOUNTER — Ambulatory Visit (INDEPENDENT_AMBULATORY_CARE_PROVIDER_SITE_OTHER): Payer: Medicare HMO | Admitting: *Deleted

## 2020-03-06 ENCOUNTER — Other Ambulatory Visit: Payer: Self-pay

## 2020-03-06 DIAGNOSIS — Z951 Presence of aortocoronary bypass graft: Secondary | ICD-10-CM

## 2020-03-06 DIAGNOSIS — Z8774 Personal history of (corrected) congenital malformations of heart and circulatory system: Secondary | ICD-10-CM

## 2020-03-06 DIAGNOSIS — Z5181 Encounter for therapeutic drug level monitoring: Secondary | ICD-10-CM

## 2020-03-06 DIAGNOSIS — Z953 Presence of xenogenic heart valve: Secondary | ICD-10-CM

## 2020-03-06 LAB — POCT INR: INR: 2.1 (ref 2.0–3.0)

## 2020-03-06 NOTE — Patient Instructions (Signed)
Description   Continue taking Warfarin 7.5mg daily. Recheck INR in 6 weeks. [facility] [phone] Main [phone].     

## 2020-03-06 NOTE — Progress Notes (Signed)
Remote pacemaker transmission.   

## 2020-04-07 ENCOUNTER — Other Ambulatory Visit: Payer: Self-pay | Admitting: Cardiovascular Disease

## 2020-04-17 ENCOUNTER — Other Ambulatory Visit: Payer: Self-pay

## 2020-04-17 ENCOUNTER — Ambulatory Visit (INDEPENDENT_AMBULATORY_CARE_PROVIDER_SITE_OTHER): Payer: Medicare HMO

## 2020-04-17 DIAGNOSIS — Z951 Presence of aortocoronary bypass graft: Secondary | ICD-10-CM | POA: Diagnosis not present

## 2020-04-17 DIAGNOSIS — Z953 Presence of xenogenic heart valve: Secondary | ICD-10-CM

## 2020-04-17 DIAGNOSIS — Z5181 Encounter for therapeutic drug level monitoring: Secondary | ICD-10-CM | POA: Diagnosis not present

## 2020-04-17 DIAGNOSIS — Z8774 Personal history of (corrected) congenital malformations of heart and circulatory system: Secondary | ICD-10-CM

## 2020-04-17 LAB — POCT INR: INR: 2.6 (ref 2.0–3.0)

## 2020-04-17 NOTE — Patient Instructions (Signed)
Description   Continue on same dosage of Warfarin 7.5mg  daily. Recheck INR in 6 weeks. [facility] [phone] Main [phone].

## 2020-04-30 ENCOUNTER — Ambulatory Visit (INDEPENDENT_AMBULATORY_CARE_PROVIDER_SITE_OTHER): Payer: Medicare HMO | Admitting: Family Medicine

## 2020-04-30 ENCOUNTER — Encounter: Payer: Self-pay | Admitting: Family Medicine

## 2020-04-30 ENCOUNTER — Other Ambulatory Visit: Payer: Self-pay

## 2020-04-30 VITALS — BP 120/86 | HR 78 | Temp 96.0°F | Ht 67.75 in | Wt 242.4 lb

## 2020-04-30 DIAGNOSIS — E782 Mixed hyperlipidemia: Secondary | ICD-10-CM | POA: Diagnosis not present

## 2020-04-30 DIAGNOSIS — L570 Actinic keratosis: Secondary | ICD-10-CM | POA: Diagnosis not present

## 2020-04-30 DIAGNOSIS — I700 Atherosclerosis of aorta: Secondary | ICD-10-CM

## 2020-04-30 DIAGNOSIS — Z950 Presence of cardiac pacemaker: Secondary | ICD-10-CM

## 2020-04-30 DIAGNOSIS — E291 Testicular hypofunction: Secondary | ICD-10-CM | POA: Diagnosis not present

## 2020-04-30 DIAGNOSIS — Z87891 Personal history of nicotine dependence: Secondary | ICD-10-CM | POA: Diagnosis not present

## 2020-04-30 DIAGNOSIS — J3489 Other specified disorders of nose and nasal sinuses: Secondary | ICD-10-CM | POA: Diagnosis not present

## 2020-04-30 DIAGNOSIS — Z5181 Encounter for therapeutic drug level monitoring: Secondary | ICD-10-CM

## 2020-04-30 DIAGNOSIS — C61 Malignant neoplasm of prostate: Secondary | ICD-10-CM | POA: Diagnosis not present

## 2020-04-30 DIAGNOSIS — R911 Solitary pulmonary nodule: Secondary | ICD-10-CM

## 2020-04-30 DIAGNOSIS — Z951 Presence of aortocoronary bypass graft: Secondary | ICD-10-CM

## 2020-04-30 DIAGNOSIS — Z8719 Personal history of other diseases of the digestive system: Secondary | ICD-10-CM

## 2020-04-30 DIAGNOSIS — E669 Obesity, unspecified: Secondary | ICD-10-CM | POA: Diagnosis not present

## 2020-04-30 DIAGNOSIS — I2510 Atherosclerotic heart disease of native coronary artery without angina pectoris: Secondary | ICD-10-CM

## 2020-04-30 DIAGNOSIS — I442 Atrioventricular block, complete: Secondary | ICD-10-CM

## 2020-04-30 DIAGNOSIS — Z953 Presence of xenogenic heart valve: Secondary | ICD-10-CM | POA: Diagnosis not present

## 2020-04-30 NOTE — Progress Notes (Signed)
[name] is a 70 y.o. male who presents for annual wellness visit and follow-up on chronic medical conditions.  He has a history of actinic keratosis and has been using 5-FU.  He does have a lesion present on the left side of his nose that has not responded to this.  Does have a history of esophageal stricture but is not having any present difficulty with that.  He also has a history of prostate cancer and did have seeds.  He continues on atorvastatin and is having no difficulty with that.  He does have underlying coronary disease with previous history of CABG, aortic and mitral valve replacement.  Also has x-ray evidence of atherosclerosis.  He is presently on Coumadin and having this fairly well controlled.  He also has a pacer in place.  He is semiretired.  His marriage is going well.   Immunizations and Health Maintenance Immunization History  Administered Date(s) Administered  . DTaP 10/31/1995  . Fluad Quad(high Dose 65+) 10/20/2018  . Influenza Split 10/23/2010, 12/27/2011  . Influenza, High Dose Seasonal PF 11/05/2015, 04/26/2017  . Influenza,inj,Quad PF,6+ Mos 12/17/2013  . Janssen (J&J) SARS-COV-2 Vaccination 05/21/2019, 02/27/2020  . PPD Test 11/05/2015  . Pneumococcal Conjugate-13 11/05/2015  . Pneumococcal Polysaccharide-23 10/23/2010  . Tdap 12/27/2011  . Zoster 10/23/2010   Health Maintenance Due  Topic Date Due  . INFLUENZA VACCINE  09/09/2019    Last colonoscopy: 03/20/08  Last PSA: 02/16/19 Dentist: Q six months Ophtho:Q years Exercise: has a demanding job  Other doctors caring for patient include: Dr. Rayann Heman cardiology, Dr. Angelena Form  Cardiology, Dr. Lovena Neighbours urology, Dr, Epifanio Lesches onc  Advanced Directives: Does Patient Have a Medical Advance Directive?: No Would patient like information on creating a medical advance directive?: Yes (ED - Information included in AVS)  Depression screen:  See questionnaire below.     Depression screen [facility] 2/9 04/30/2020  04/30/2019 04/28/2018 04/26/2017 11/05/2015  Decreased Interest 0 0 0 0 0  Down, Depressed, Hopeless 0 0 0 0 0  PHQ - 2 Score 0 0 0 0 0    Fall Screen: See Questionaire below.   Fall Risk  04/30/2020 04/30/2019 04/28/2018 04/26/2017 11/05/2015  Falls in the past year? 0 0 0 No No  Number falls in past yr: 0 - - - -  Injury with Fall? 0 - - - -  Risk for fall due to : No Fall Risks - - - -  Follow up Falls evaluation completed - - - -    ADL screen:  See questionnaire below.  Functional Status Survey: Is the patient deaf or have difficulty hearing?: No Does the patient have difficulty seeing, even when wearing glasses/contacts?: No Does the patient have difficulty concentrating, remembering, or making decisions?: No Does the patient have difficulty walking or climbing stairs?: No (not everyday Knee pain) Does the patient have difficulty dressing or bathing?: No Does the patient have difficulty doing errands alone such as visiting a doctor's office or shopping?: No   Review of Systems  Constitutional: -, -unexpected weight change, -anorexia, -fatigue Allergy: -sneezing, -itching, -congestion Dermatology: denies changing moles, rash, lumps ENT: -runny nose, -ear pain, -sore throat,  Cardiology:  -chest pain, -palpitations, -orthopnea, Respiratory: -cough, -shortness of breath, -dyspnea on exertion, -wheezing,  Gastroenterology: -abdominal pain, -nausea, -vomiting, -diarrhea, -constipation, -dysphagia Hematology: -bleeding or bruising problems Musculoskeletal: -arthralgias, -myalgias, -joint swelling, -back pain, - Ophthalmology: -vision changes,  Urology: -dysuria, -difficulty urinating,  -urinary frequency, -urgency, incontinence Neurology: -, -numbness, , -memory  loss, -falls, -dizziness    PHYSICAL EXAM: General Appearance: Alert, cooperative, no distress, appears stated age Head: Normocephalic, without obvious abnormality, atraumatic Eyes: PERRL, conjunctiva/corneas clear, EOM's  intact,  Ears: Normal TM's and external ear canals Nose: Nares normal, mucosa normal, no drainage or sinus   tenderness Throat: Lips, mucosa, and tongue normal; teeth and gums normal Neck: Supple, no lymphadenopathy, thyroid:no enlargement/tenderness/nodules; no carotid bruit or JVD Lungs: Clear to auscultation bilaterally without wheezes, rales or ronchi; respirations unlabored Heart: Regular rate and rhythm, S1 and S2 is loud normal, 1/6 SEM,no  rub or gallop Abdomen: Soft, non-tender, nondistended, normoactive bowel sounds, no masses, no hepatosplenomegaly Extremities: No clubbing, cyanosis or edema Pulses: 2+ and symmetric all extremities Skin: Skin color, texture, turgor normal, no rashes or lesions Lymph nodes: Cervical, supraclavicular, and axillary nodes normal Neurologic: CNII-XII intact, normal strength, sensation and gait; reflexes 2+ and symmetric throughout   Psych: Normal mood, affect, hygiene and grooming  ASSESSMENT/PLAN: S/P CABG x 2  Obesity (BMI 30-39.9)  S/P aortic + mitral valve replacement with bioprosthetic valves + CABG x2  Malignant neoplasm of prostate [facility])  Former smoker  Atherosclerosis of aorta (HCC)  Mixed hyperlipidemia  Hypogonadism in male  Pacemaker  Nasal lesion  Actinic keratosis  History of esophageal stricture  Coronary artery disease involving native coronary artery of native heart without angina pectoris  Incidental pulmonary nodule, > 25mm and < 82mm  Encounter for therapeutic drug monitoring  Complete heart block ([place]) He will continue to be followed by cardiology for his various cardiac related diagnoses.  Continue on Coumadin at the present time.  Continue on atorvastatin as well as metoprolol.  Recommend he use Efudex for his AK and see dermatology for the possible BCE on his nose. He will discuss possible testosterone therapy with his urologist especially since he does have underlying prostate cancer.     Immunization  recommendations discussed.  Recommend he get Shingrix shot.  Colonoscopy recommendations reviewed.  No therapy needed from the stricture since he is not having any difficulty with that.   Medicare Attestation I have personally reviewed: The patient's medical and social history Their use of alcohol, tobacco or illicit drugs Their current medications and supplements The patient's functional ability including ADLs,fall risks, home safety risks, cognitive, and hearing and visual impairment Diet and physical activities Evidence for depression or mood disorders  The patient's weight, height, and BMI have been recorded in the chart.  I have made referrals, counseling, and provided education to the patient based on review of the above and I have provided the patient with a written personalized care plan for preventive services.     Jill Alexanders, MD   04/30/2020

## 2020-04-30 NOTE — Patient Instructions (Signed)
  [name] , Thank you for taking time to come for your Medicare Wellness Visit. I appreciate your ongoing commitment to your health goals. Please review the following plan we discussed and let me know if I can assist you in the future.   These are the goals we discussed: Goals   None     This is a list of the screening recommended for you and due dates:  Health Maintenance  Topic Date Due  . Flu Shot  09/09/2019  . Tetanus Vaccine  12/26/2021  . Colon Cancer Screening  06/02/2023  . COVID-19 Vaccine  Completed  .  Hepatitis C: One time screening is recommended by Center for Disease Control  (CDC) for  adults born from [birth year] through 1965.   Completed  . HPV Vaccine  Aged Out  . Pneumonia vaccines  Discontinued

## 2020-05-01 LAB — COMPREHENSIVE METABOLIC PANEL
ALT: 17 IU/L (ref 0–44)
AST: 22 IU/L (ref 0–40)
Albumin/Globulin Ratio: 1.8 (ref 1.2–2.2)
Albumin: 4.4 g/dL (ref 3.8–4.8)
Alkaline Phosphatase: 85 IU/L (ref 44–121)
BUN/Creatinine Ratio: 15 (ref 10–24)
BUN: 16 mg/dL (ref 8–27)
Bilirubin Total: 0.7 mg/dL (ref 0.0–1.2)
CO2: 23 mmol/L (ref 20–29)
Calcium: 9.5 mg/dL (ref 8.6–10.2)
Chloride: 104 mmol/L (ref 96–106)
Creatinine, Ser: 1.04 mg/dL (ref 0.76–1.27)
Globulin, Total: 2.5 g/dL (ref 1.5–4.5)
Glucose: 113 mg/dL — ABNORMAL HIGH (ref 65–99)
Potassium: 4.8 mmol/L (ref 3.5–5.2)
Sodium: 144 mmol/L (ref 134–144)
Total Protein: 6.9 g/dL (ref 6.0–8.5)
eGFR: 78 mL/min/{1.73_m2} (ref >59)

## 2020-05-01 LAB — CBC WITH DIFFERENTIAL/PLATELET
Basophils Absolute: 0.1 10*3/uL (ref 0.0–0.2)
Basos: 1 %
EOS (ABSOLUTE): 0.2 10*3/uL (ref 0.0–0.4)
Eos: 3 %
Hematocrit: 43.6 % (ref 37.5–51.0)
Hemoglobin: 14.6 g/dL (ref 13.0–17.7)
Immature Grans (Abs): 0 10*3/uL (ref 0.0–0.1)
Immature Granulocytes: 1 %
Lymphocytes Absolute: 1.2 10*3/uL (ref 0.7–3.1)
Lymphs: 19 %
MCH: 28 pg (ref 26.6–33.0)
MCHC: 33.5 g/dL (ref 31.5–35.7)
MCV: 84 fL (ref 79–97)
Monocytes Absolute: 0.6 10*3/uL (ref 0.1–0.9)
Monocytes: 10 %
Neutrophils Absolute: 4.2 10*3/uL (ref 1.4–7.0)
Neutrophils: 66 %
Platelets: 206 10*3/uL (ref 150–450)
RBC: 5.21 x10E6/uL (ref 4.14–5.80)
RDW: 13.9 % (ref 11.6–15.4)
WBC: 6.2 10*3/uL (ref 3.4–10.8)

## 2020-05-01 LAB — LIPID PANEL
Chol/HDL Ratio: 2.1 ratio (ref 0.0–5.0)
Cholesterol, Total: 120 mg/dL (ref 100–199)
HDL: 57 mg/dL (ref >39)
LDL Chol Calc (NIH): 48 mg/dL (ref 0–99)
Triglycerides: 77 mg/dL (ref 0–149)
VLDL Cholesterol Cal: 15 mg/dL (ref 5–40)

## 2020-05-02 DIAGNOSIS — L578 Other skin changes due to chronic exposure to nonionizing radiation: Secondary | ICD-10-CM | POA: Diagnosis not present

## 2020-05-02 DIAGNOSIS — L570 Actinic keratosis: Secondary | ICD-10-CM | POA: Diagnosis not present

## 2020-05-02 DIAGNOSIS — L814 Other melanin hyperpigmentation: Secondary | ICD-10-CM | POA: Diagnosis not present

## 2020-05-02 DIAGNOSIS — C44311 Basal cell carcinoma of skin of nose: Secondary | ICD-10-CM | POA: Diagnosis not present

## 2020-05-07 ENCOUNTER — Other Ambulatory Visit: Payer: Self-pay | Admitting: Orthopedic Surgery

## 2020-05-07 NOTE — Addendum Note (Signed)
Addended by: Elyse Jarvis on: 05/07/2020 09:47 AM   Modules accepted: Orders

## 2020-05-09 DIAGNOSIS — C44311 Basal cell carcinoma of skin of nose: Secondary | ICD-10-CM | POA: Diagnosis not present

## 2020-05-23 ENCOUNTER — Other Ambulatory Visit: Payer: Self-pay

## 2020-05-23 ENCOUNTER — Ambulatory Visit
Admission: RE | Admit: 2020-05-23 | Discharge: 2020-05-23 | Disposition: A | Discharge status: Home / Self Care | Payer: Medicare HMO | Source: Ambulatory Visit | Attending: Family Medicine | Admitting: Family Medicine

## 2020-05-23 ENCOUNTER — Ambulatory Visit (INDEPENDENT_AMBULATORY_CARE_PROVIDER_SITE_OTHER): Payer: Medicare HMO

## 2020-05-23 DIAGNOSIS — I700 Atherosclerosis of aorta: Secondary | ICD-10-CM

## 2020-05-23 DIAGNOSIS — Z951 Presence of aortocoronary bypass graft: Secondary | ICD-10-CM

## 2020-05-23 DIAGNOSIS — Z953 Presence of xenogenic heart valve: Secondary | ICD-10-CM

## 2020-05-23 DIAGNOSIS — I442 Atrioventricular block, complete: Secondary | ICD-10-CM

## 2020-05-23 DIAGNOSIS — I2510 Atherosclerotic heart disease of native coronary artery without angina pectoris: Secondary | ICD-10-CM

## 2020-05-26 LAB — CUP PACEART REMOTE DEVICE CHECK
Battery Remaining Longevity: 121 mo
Battery Voltage: 3.03 V
Brady Statistic AP VP Percent: 0.11 %
Brady Statistic AP VS Percent: 0.21 %
Brady Statistic AS VP Percent: 1.04 %
Brady Statistic AS VS Percent: 98.63 %
Brady Statistic RA Percent Paced: 0.27 %
Brady Statistic RV Percent Paced: 1.15 %
Date Time Interrogation Session: 20220414222412
Implantable Lead Implant Date: 20200908
Implantable Lead Implant Date: 20200908
Implantable Lead Location: 753859
Implantable Lead Location: 753860
Implantable Lead Model: 3830
Implantable Lead Model: 5076
Implantable Pulse Generator Implant Date: 20200908
Lead Channel Impedance Value: 285 Ohm
Lead Channel Impedance Value: 285 Ohm
Lead Channel Impedance Value: 323 Ohm
Lead Channel Impedance Value: 456 Ohm
Lead Channel Pacing Threshold Amplitude: 0.5 V
Lead Channel Pacing Threshold Amplitude: 1.625 V
Lead Channel Pacing Threshold Pulse Width: 0.4 ms
Lead Channel Pacing Threshold Pulse Width: 0.4 ms
Lead Channel Sensing Intrinsic Amplitude: 1.625 mV
Lead Channel Sensing Intrinsic Amplitude: 1.625 mV
Lead Channel Sensing Intrinsic Amplitude: 9.5 mV
Lead Channel Sensing Intrinsic Amplitude: 9.5 mV
Lead Channel Setting Pacing Amplitude: 1.5 V
Lead Channel Setting Pacing Amplitude: 2.5 V
Lead Channel Setting Pacing Pulse Width: 0.8 ms
Lead Channel Setting Sensing Sensitivity: 1.2 mV

## 2020-05-29 ENCOUNTER — Ambulatory Visit (INDEPENDENT_AMBULATORY_CARE_PROVIDER_SITE_OTHER): Payer: Medicare HMO | Admitting: *Deleted

## 2020-05-29 ENCOUNTER — Other Ambulatory Visit: Payer: Self-pay

## 2020-05-29 DIAGNOSIS — Z5181 Encounter for therapeutic drug level monitoring: Secondary | ICD-10-CM

## 2020-05-29 DIAGNOSIS — Z8774 Personal history of (corrected) congenital malformations of heart and circulatory system: Secondary | ICD-10-CM

## 2020-05-29 DIAGNOSIS — Z951 Presence of aortocoronary bypass graft: Secondary | ICD-10-CM | POA: Diagnosis not present

## 2020-05-29 DIAGNOSIS — Z953 Presence of xenogenic heart valve: Secondary | ICD-10-CM | POA: Diagnosis not present

## 2020-05-29 LAB — POCT INR: INR: 2.3 (ref 2.0–3.0)

## 2020-05-29 NOTE — Patient Instructions (Signed)
Description   Continue taking Warfarin 7.5mg  daily. Recheck INR in 6 weeks. [facility] [phone] Main [phone].

## 2020-06-09 NOTE — Progress Notes (Signed)
Remote pacemaker transmission.   

## 2020-06-26 DIAGNOSIS — C61 Malignant neoplasm of prostate: Secondary | ICD-10-CM | POA: Diagnosis not present

## 2020-07-03 DIAGNOSIS — C61 Malignant neoplasm of prostate: Secondary | ICD-10-CM | POA: Diagnosis not present

## 2020-07-03 DIAGNOSIS — N500 Atrophy of testis: Secondary | ICD-10-CM | POA: Diagnosis not present

## 2020-07-10 ENCOUNTER — Ambulatory Visit (INDEPENDENT_AMBULATORY_CARE_PROVIDER_SITE_OTHER): Payer: Medicare HMO | Admitting: *Deleted

## 2020-07-10 ENCOUNTER — Other Ambulatory Visit: Payer: Self-pay

## 2020-07-10 DIAGNOSIS — Z953 Presence of xenogenic heart valve: Secondary | ICD-10-CM

## 2020-07-10 DIAGNOSIS — Z8774 Personal history of (corrected) congenital malformations of heart and circulatory system: Secondary | ICD-10-CM

## 2020-07-10 DIAGNOSIS — Z951 Presence of aortocoronary bypass graft: Secondary | ICD-10-CM

## 2020-07-10 DIAGNOSIS — Z5181 Encounter for therapeutic drug level monitoring: Secondary | ICD-10-CM

## 2020-07-10 LAB — POCT INR: INR: 3.3 — AB (ref 2.0–3.0)

## 2020-07-10 NOTE — Patient Instructions (Signed)
Description   Hold today, then continue taking Warfarin 7.5mg  daily. Recheck INR in 3 weeks. [facility] [phone] Main [phone].

## 2020-08-01 ENCOUNTER — Other Ambulatory Visit: Payer: Self-pay

## 2020-08-01 ENCOUNTER — Ambulatory Visit (INDEPENDENT_AMBULATORY_CARE_PROVIDER_SITE_OTHER): Payer: Medicare HMO | Admitting: Pharmacist

## 2020-08-01 DIAGNOSIS — Z953 Presence of xenogenic heart valve: Secondary | ICD-10-CM

## 2020-08-01 DIAGNOSIS — Z951 Presence of aortocoronary bypass graft: Secondary | ICD-10-CM | POA: Diagnosis not present

## 2020-08-01 DIAGNOSIS — Z5181 Encounter for therapeutic drug level monitoring: Secondary | ICD-10-CM | POA: Diagnosis not present

## 2020-08-01 DIAGNOSIS — Z8774 Personal history of (corrected) congenital malformations of heart and circulatory system: Secondary | ICD-10-CM

## 2020-08-01 LAB — POCT INR: INR: 3.3 — AB (ref 2.0–3.0)

## 2020-08-01 NOTE — Patient Instructions (Signed)
Description   Hold today, then continue taking Warfarin 1 tablet (7.5mg ) daily. Recheck INR in 3 weeks. [facility] [phone] Main [phone].

## 2020-08-07 ENCOUNTER — Encounter: Payer: Self-pay | Admitting: Cardiovascular Disease

## 2020-08-07 ENCOUNTER — Other Ambulatory Visit: Payer: Self-pay

## 2020-08-07 ENCOUNTER — Ambulatory Visit: Payer: Medicare HMO | Admitting: Cardiovascular Disease

## 2020-08-07 VITALS — BP 110/70 | HR 77 | Ht 67.75 in | Wt 245.6 lb

## 2020-08-07 DIAGNOSIS — Z953 Presence of xenogenic heart valve: Secondary | ICD-10-CM | POA: Diagnosis not present

## 2020-08-07 DIAGNOSIS — I2510 Atherosclerotic heart disease of native coronary artery without angina pectoris: Secondary | ICD-10-CM

## 2020-08-07 DIAGNOSIS — I442 Atrioventricular block, complete: Secondary | ICD-10-CM

## 2020-08-07 NOTE — Patient Instructions (Signed)
Medication Instructions:  Your physician recommends that you continue on your current medications as directed. Please refer to the Current Medication list given to you today.  *If you need a refill on your cardiac medications before your next appointment, please call your pharmacy*  Lab Work: None Ordered If you have labs (blood work) drawn today and your tests are completely normal, you will receive your results only by: . MyChart Message (if you have MyChart) OR . A paper copy in the mail If you have any lab test that is abnormal or we need to change your treatment, we will call you to review the results.   Testing/Procedures: None Ordered   Follow-Up: At [facility], you and your health needs are our priority.  As part of our continuing mission to provide you with exceptional heart care, we have created designated Provider Care Teams.  These Care Teams include your primary Cardiologist (physician) and Advanced Practice Providers (APPs -  Physician Assistants and Nurse Practitioners) who all work together to provide you with the care you need, when you need it.  Your next appointment:   1 year(s)  The format for your next appointment:   In Person  Provider:   You may see Christopher McAlhany, MD or one of the following Advanced Practice Providers on your designated Care Team:    Dayna Dunn, PA-C  Michele Lenze, PA-C    

## 2020-08-07 NOTE — Progress Notes (Signed)
Chief Complaint  Patient presents with   Follow-up    CAD    History of Present Illness: 70 yo male with history of aortic stenosis, mitral stenosis, tobacco abuse, hyperlipidemia, prostate cancer and esophageal stricture here today for cardiac follow up. He is known to have severe aortic stenosis, moderate aortic regurgitation and mitral stenosis. Echo 12/14/16 with normal LV size and function, LVEF=60-65%. Moderate LVH. Moderate aortic stenosis (mean gradient 35 mmHg, peak gradient 63 mmHg, AVA 1.21 cm2), moderate AI, moderate MS. echo May 2020 with with normal LV size and function, LVEF=65%. Moderate LVH. Severe aortic stenosis (mean gradient 45 mmHg, peak gradient 70 mmHg), moderate AI, severe MS (mean gradient 13 mmHg). Cardiac cath August 2020 with chronically occluded proximal RCA and severe stenosis in the mid LAD. He underwent 2V CABG (LIMA to LAD, SVG to RCA), bioprosthetic AVR and bioprosthetic MVR, left atrial appendage clipping and PFO closure in September 2020. His post-operative course was complicated by bradycardia and atrial flutter requiring pacemaker placement. He was discharged on coumadin. He remained in atrial flutter for the next month but then converted to sinus. He has been diagnosed with prostate cancer and has completed treatment. Echo May 2021 with LVEF=60-65%, mild LVH. Normally functioning aortic vale replacement and normally functioning mitral valve replacement.   He is here today for follow up. The patient denies any chest pain, dyspnea, palpitations, lower extremity edema, orthopnea, PND, dizziness, near syncope or syncope. He is retiring in a few months.   .  Primary Care Physician: Denita Lung, MD  Past Medical History:  Diagnosis Date   BCE (basal cell epithelioma), arm    RIGHT SHOULDER   Bradycardia    Bumps on skin    on left side of nose for last 3 weeks   CAD (coronary artery disease)    a. s/p CABGx2 10/11/18 LIMA to LAD, SVG to RCA, EVH via right  thigh.   Dyslipidemia    Ectatic thoracic aorta (HCC)    Esophageal stricture    GERD (gastroesophageal reflux disease)    Heart murmur    sees dr Clydene Fake   Hyperlipidemia    Incidental pulmonary nodule, > 38mm and < 40mm 10/04/2018   Noted on CTA   Inguinal hernia    bilateral   Obesity    Presence of permanent cardiac pacemaker    Prostate cancer ([place])    S/P aortic valve replacement with bioprosthetic valve 10/11/2018   23 mm Edwards Inspiris Resilia stented bovine pericardial tissue valve, miltral valve done also   S/P CABG x 2 10/11/2018   LIMA to LAD, SVG to RCA, EVH via right thigh   S/P mitral valve replacement with bioprosthetic valve 10/11/2018   27 mm Medtronic Mosaic stented porcine bioprosthetic tissue valve   S/P patent foramen ovale closure 10/11/2018   S/P placement of cardiac pacemaker    Smoker     Past Surgical History:  Procedure Laterality Date   AORTIC VALVE REPLACEMENT N/A 10/11/2018   Procedure: AORTIC VALVE REPLACEMENT (AVR) with 23 Inspiris Bioprosthetic Aortic valve.;  Surgeon: Rexene Alberts, MD;  Location: [place];  Service: Open Heart Surgery;  Laterality: N/A;   CLIPPING OF ATRIAL APPENDAGE N/A 10/11/2018   Procedure: CLIPPING OF LEFT ATRIAL APPENDAGE with 45 AtriCure Clip.;  Surgeon: Rexene Alberts, MD;  Location: [place];  Service: Open Heart Surgery;  Laterality: N/A;   COLONOSCOPY  03/2008,2004   Dr. Amedeo Plenty   CORONARY ARTERY BYPASS GRAFT N/A 10/11/2018  Procedure: CORONARY ARTERY BYPASS GRAFTING (CABG) x 2 using LIMA to the LAD and endoscopic greater saphenous vein harvesting to the RCA.;  Surgeon: Rexene Alberts, MD;  Location: [place];  Service: Open Heart Surgery;  Laterality: N/A;   CYSTOSCOPY  04/04/2019   Procedure: CYSTOSCOPY;  Surgeon: Ceasar Mons, MD;  Location: [facility];  Service: Urology;;   HERNIA REPAIR  12/11/10   BIH   MITRAL VALVE REPLACEMENT N/A 10/11/2018   Procedure: MITRAL VALVE (MV) REPLACEMENT with 27  Mosaic Bioprosthetic Mitral valve.;  Surgeon: Rexene Alberts, MD;  Location: [place];  Service: Open Heart Surgery;  Laterality: N/A;   PACEMAKER IMPLANT N/A 10/17/2018   Procedure: PACEMAKER IMPLANT;  Surgeon: Thompson Grayer, MD;  Location: [facility];  Service: Cardiovascular;  Laterality: N/A;   RADIOACTIVE SEED IMPLANT N/A 04/04/2019   Procedure: RADIOACTIVE SEED IMPLANT/BRACHYTHERAPY IMPLANT, cystoscopy;  Surgeon: Ceasar Mons, MD;  Location: [facility];  Service: Urology;  Laterality: N/A;  58 seeds   REPAIR OF PATENT FORAMEN OVALE N/A 10/11/2018   Procedure: CLOSURE OF PATENT FORAMEN OVALE;  Surgeon: Rexene Alberts, MD;  Location: [place];  Service: Open Heart Surgery;  Laterality: N/A;   RIGHT/LEFT HEART CATH AND CORONARY ANGIOGRAPHY N/A 09/15/2018   Procedure: RIGHT/LEFT HEART CATH AND CORONARY ANGIOGRAPHY;  Surgeon: Burnell Blanks, MD;  Location: [place] [facility];  Service: Cardiovascular;  Laterality: N/A;   TEE WITHOUT CARDIOVERSION N/A 12/12/2015   Procedure: TRANSESOPHAGEAL ECHOCARDIOGRAM (TEE);  Surgeon: Sanda Klein, MD;  Location: [facility];  Service: Cardiovascular;  Laterality: N/A;   TEE WITHOUT CARDIOVERSION N/A 10/11/2018   Procedure: TRANSESOPHAGEAL ECHOCARDIOGRAM (TEE);  Surgeon: Rexene Alberts, MD;  Location: [place];  Service: Open Heart Surgery;  Laterality: N/A;   THYMECTOMY  1976   radaiation tx done    Current Outpatient Medications  Medication Sig Dispense Refill   amoxicillin (AMOXIL) 500 MG capsule Take 4 tablets (2000 mg) by mouth ONE HOUR PRIOR TO ALL DENTAL PROCEDURES 12 capsule 1   atorvastatin (LIPITOR) 20 MG tablet TAKE 1 TABLET BY MOUTH DAILY AT 6PM 90 tablet 3   calcium carbonate (TUMS - DOSED IN MG ELEMENTAL CALCIUM) 500 MG chewable tablet Chew 1 tablet by mouth as needed for indigestion or heartburn.     metoprolol tartrate (LOPRESSOR) 25 MG tablet TAKE 1 TABLET BY MOUTH 2 TIMES A DAY 180 tablet 3    oxymetazoline (AFRIN) 0.05 % nasal spray Place 1 spray into both nostrils at bedtime as needed for congestion.     warfarin (COUMADIN) 7.5 MG tablet TAKE 1 TABLET BY MOUTH EVERY DAY 35 tablet 2   No current facility-administered medications for this visit.    Allergies  Allergen Reactions   Tylenol [Acetaminophen] Nausea Only    Social History   Socioeconomic History   Marital status: Married    Spouse name: Not on file   Number of children: 0   Years of education: Not on file   Highest education level: Not on file  Occupational History   Occupation: Geophysicist/field seismologist    Employer: [employer]  Tobacco Use   Smoking status: Former    Packs/day: 0.25    Years: 35.00    Pack years: 8.75    Types: Cigarettes    Quit date: 09/04/2018    Years since quitting: 1.9   Smokeless tobacco: Never  Vaping Use   Vaping Use: Never used  Substance and Sexual Activity   Alcohol  use: Yes    Alcohol/week: 1.0 standard drink    Types: 1 Cans of beer per week    Comment: rare   Drug use: No   Sexual activity: Yes  Other Topics Concern   Not on file  Social History Narrative   Not on file   Social Determinants of Health   Financial Resource Strain: Not on file  Food Insecurity: Not on file  Transportation Needs: Not on file  Physical Activity: Not on file  Stress: Not on file  Social Connections: Not on file  Intimate Partner Violence: Not on file    Family History  Problem Relation Age of Onset   Colon cancer Mother    Lung cancer Father        smoker   CAD Neg Hx    Pancreatic cancer Neg Hx    Breast cancer Neg Hx     Review of Systems:  As stated in the HPI and otherwise negative.   BP 110/70   Pulse 77   Ht 5' 7.75" (1.721 m)   Wt 245 lb 9.6 oz (111.4 kg)   SpO2 95%   BMI 37.62 kg/m   Physical Examination: General: Well developed, well nourished, NAD  HEENT: OP clear, mucus membranes moist  SKIN: warm, dry. No rashes. Neuro: No focal deficits  Musculoskeletal:  Muscle strength 5/5 all ext  Psychiatric: Mood and affect normal  Neck: No JVD, no carotid bruits, no thyromegaly, no lymphadenopathy.  Lungs:Clear bilaterally, no wheezes, rhonci, crackles Cardiovascular: Regular rate and rhythm. No murmurs, gallops or rubs. Abdomen:Soft. Bowel sounds present. Non-tender.  Extremities: No lower extremity edema. Pulses are 2 + in the bilateral DP/PT.  Echo 06/13/19:   1. Left ventricular ejection fraction, by estimation, is 60 to 65%. The  left ventricle has normal function. The left ventricle has no regional  wall motion abnormalities. There is mild concentric left ventricular  hypertrophy. Left ventricular diastolic  function could not be evaluated.   2. Right ventricular systolic function is moderately reduced. The right  ventricular size is moderately enlarged. There is normal pulmonary artery  systolic pressure.   3. Left atrial size was mildly dilated.   4. Right atrial size was mildly dilated.   5. The mitral valve has been repaired/replaced. No evidence of mitral  valve regurgitation. There is a 27 mm Medtronic bioprosthetic valve  present in the mitral position. Procedure Date: 10/11/2018. Echo findings  are consistent with normal structure and  function of the mitral valve prosthesis.   6. The tricuspid valve is abnormal. Tricuspid valve regurgitation is  moderate.   7. The aortic valve has been repaired/replaced. Aortic valve  regurgitation is not visualized. There is a 23 mm Edwards bovine valve  present in the aortic position. Procedure Date: 10/11/2018. Echo findings  are consistent with normal structure and function   of the aortic valve prosthesis.   8. The inferior vena cava is normal in size with <50% respiratory  variability, suggesting right atrial pressure of 8 mmHg.   EKG:  EKG is ordered today. The ekg ordered today demonstrates. Sinus, RBBB  Recent Labs: 04/30/2020: ALT 17; BUN 16; Creatinine, Ser 1.04; Hemoglobin 14.6;  Platelets 206; Potassium 4.8; Sodium 144   Lipid Panel    Component Value Date/Time   CHOL 120 04/30/2020 1047   TRIG 77 04/30/2020 1047   HDL 57 04/30/2020 1047   CHOLHDL 2.1 04/30/2020 1047   CHOLHDL 4.2 03/25/2015 0001   VLDL 30 03/25/2015 0001  LDLCALC 48 04/30/2020 1047     Wt Readings from Last 3 Encounters:  08/07/20 245 lb 9.6 oz (111.4 kg)  04/30/20 242 lb 6.4 oz (110 kg)  11/05/19 245 lb (111.1 kg)     Other studies Reviewed: Additional studies/ records that were reviewed today include: . Review of the above records demonstrates:   Assessment and Plan:   1. Severe Aortic stenosis/moderate aortic insufficiency: He is now s/p placement of a bioprosthetic AVR in September 2020.  Echo May 2021 with normally functioning valve   2. Mitral valve stenosis: He is s/p bioprosthetic MVR in September 2020.  Normally functioning MVR by echo in May 2021.   3. Complete heart block: Pacemaker in place. Followed by Dr. Rayann Heman.    4. Atrial flutter, paroxysmal: Sinus today. CHADS VASC score 3. Continue coumadin.   5. CAD s/p CABG: He has no chest pain. Continue ASA, statin and beta blocker.   Current medicines are reviewed at length with the patient today.  The patient does not have concerns regarding medicines.  The following changes have been made:  no change  Labs/ tests ordered today include:   Orders Placed This Encounter  Procedures   EKG 12-Lead     Disposition:   F/U with me in 12 months  Signed, Lauree Chandler, MD 08/07/2020 10:55 AM    [facility] [place], [place],   [address] Phone: [phone]; Fax: [phone]

## 2020-08-18 DIAGNOSIS — C44311 Basal cell carcinoma of skin of nose: Secondary | ICD-10-CM | POA: Diagnosis not present

## 2020-08-18 DIAGNOSIS — L570 Actinic keratosis: Secondary | ICD-10-CM | POA: Diagnosis not present

## 2020-08-18 DIAGNOSIS — C44519 Basal cell carcinoma of skin of other part of trunk: Secondary | ICD-10-CM | POA: Diagnosis not present

## 2020-08-21 LAB — CUP PACEART REMOTE DEVICE CHECK
Battery Remaining Longevity: 124 mo
Battery Voltage: 3.03 V
Brady Statistic AP VP Percent: 0.63 %
Brady Statistic AP VS Percent: 0.56 %
Brady Statistic AS VP Percent: 14.46 %
Brady Statistic AS VS Percent: 84.35 %
Brady Statistic RA Percent Paced: 0.83 %
Brady Statistic RV Percent Paced: 15.09 %
Date Time Interrogation Session: 20220713203757
Implantable Lead Implant Date: 20200908
Implantable Lead Implant Date: 20200908
Implantable Lead Location: 753859
Implantable Lead Location: 753860
Implantable Lead Model: 3830
Implantable Lead Model: 5076
Implantable Pulse Generator Implant Date: 20200908
Lead Channel Impedance Value: 285 Ohm
Lead Channel Impedance Value: 304 Ohm
Lead Channel Impedance Value: 361 Ohm
Lead Channel Impedance Value: 513 Ohm
Lead Channel Pacing Threshold Amplitude: 0.625 V
Lead Channel Pacing Threshold Amplitude: 1.75 V
Lead Channel Pacing Threshold Pulse Width: 0.4 ms
Lead Channel Pacing Threshold Pulse Width: 0.4 ms
Lead Channel Sensing Intrinsic Amplitude: 3.125 mV
Lead Channel Sensing Intrinsic Amplitude: 3.125 mV
Lead Channel Sensing Intrinsic Amplitude: 8.875 mV
Lead Channel Sensing Intrinsic Amplitude: 8.875 mV
Lead Channel Setting Pacing Amplitude: 1.5 V
Lead Channel Setting Pacing Amplitude: 2.5 V
Lead Channel Setting Pacing Pulse Width: 0.8 ms
Lead Channel Setting Sensing Sensitivity: 1.2 mV

## 2020-08-22 ENCOUNTER — Other Ambulatory Visit: Payer: Self-pay

## 2020-08-22 ENCOUNTER — Ambulatory Visit (INDEPENDENT_AMBULATORY_CARE_PROVIDER_SITE_OTHER): Payer: Medicare HMO

## 2020-08-22 DIAGNOSIS — Z5181 Encounter for therapeutic drug level monitoring: Secondary | ICD-10-CM

## 2020-08-22 DIAGNOSIS — Z8774 Personal history of (corrected) congenital malformations of heart and circulatory system: Secondary | ICD-10-CM | POA: Diagnosis not present

## 2020-08-22 DIAGNOSIS — Z953 Presence of xenogenic heart valve: Secondary | ICD-10-CM

## 2020-08-22 DIAGNOSIS — Z951 Presence of aortocoronary bypass graft: Secondary | ICD-10-CM

## 2020-08-22 DIAGNOSIS — I442 Atrioventricular block, complete: Secondary | ICD-10-CM

## 2020-08-22 LAB — POCT INR: INR: 3 (ref 2.0–3.0)

## 2020-08-22 NOTE — Patient Instructions (Signed)
-   continue taking Warfarin 1 tablet (7.5mg ) daily.  - Recheck INR in 4 weeks.  [facility] [phone] Main [phone].

## 2020-08-28 ENCOUNTER — Other Ambulatory Visit: Payer: Self-pay | Admitting: Cardiovascular Disease

## 2020-09-10 ENCOUNTER — Ambulatory Visit: Payer: Medicare HMO | Admitting: Cardiovascular Disease

## 2020-09-15 NOTE — Progress Notes (Signed)
Remote pacemaker transmission.   

## 2020-09-19 ENCOUNTER — Ambulatory Visit (INDEPENDENT_AMBULATORY_CARE_PROVIDER_SITE_OTHER): Payer: Medicare HMO

## 2020-09-19 ENCOUNTER — Other Ambulatory Visit: Payer: Self-pay

## 2020-09-19 DIAGNOSIS — Z953 Presence of xenogenic heart valve: Secondary | ICD-10-CM | POA: Diagnosis not present

## 2020-09-19 DIAGNOSIS — Z8774 Personal history of (corrected) congenital malformations of heart and circulatory system: Secondary | ICD-10-CM

## 2020-09-19 DIAGNOSIS — Z5181 Encounter for therapeutic drug level monitoring: Secondary | ICD-10-CM

## 2020-09-19 DIAGNOSIS — Z951 Presence of aortocoronary bypass graft: Secondary | ICD-10-CM | POA: Diagnosis not present

## 2020-09-19 LAB — POCT INR: INR: 2.4 (ref 2.0–3.0)

## 2020-09-19 NOTE — Patient Instructions (Signed)
Description   Continue taking Warfarin 1 tablet (7.'5mg'$ ) daily.  Recheck INR in 5 weeks. [facility] [phone] Main [phone].

## 2020-10-06 ENCOUNTER — Other Ambulatory Visit: Payer: Self-pay | Admitting: Cardiovascular Disease

## 2020-10-14 DIAGNOSIS — L570 Actinic keratosis: Secondary | ICD-10-CM | POA: Diagnosis not present

## 2020-10-24 ENCOUNTER — Ambulatory Visit (INDEPENDENT_AMBULATORY_CARE_PROVIDER_SITE_OTHER): Payer: Medicare HMO | Admitting: *Deleted

## 2020-10-24 ENCOUNTER — Other Ambulatory Visit: Payer: Self-pay

## 2020-10-24 DIAGNOSIS — Z5181 Encounter for therapeutic drug level monitoring: Secondary | ICD-10-CM | POA: Diagnosis not present

## 2020-10-24 DIAGNOSIS — Z8774 Personal history of (corrected) congenital malformations of heart and circulatory system: Secondary | ICD-10-CM

## 2020-10-24 DIAGNOSIS — Z953 Presence of xenogenic heart valve: Secondary | ICD-10-CM

## 2020-10-24 DIAGNOSIS — Z951 Presence of aortocoronary bypass graft: Secondary | ICD-10-CM | POA: Diagnosis not present

## 2020-10-24 LAB — POCT INR: INR: 3.3 — AB (ref 2.0–3.0)

## 2020-10-24 NOTE — Patient Instructions (Signed)
Description   Hold warfarin today and then continue taking Warfarin 1 tablet (7.'5mg'$ ) daily.  Recheck INR in 3 weeks. [facility] [phone] Main [phone].

## 2020-10-28 DIAGNOSIS — H52223 Regular astigmatism, bilateral: Secondary | ICD-10-CM | POA: Diagnosis not present

## 2020-11-14 ENCOUNTER — Other Ambulatory Visit: Payer: Self-pay

## 2020-11-14 ENCOUNTER — Ambulatory Visit (INDEPENDENT_AMBULATORY_CARE_PROVIDER_SITE_OTHER): Payer: Medicare HMO

## 2020-11-14 DIAGNOSIS — Z8774 Personal history of (corrected) congenital malformations of heart and circulatory system: Secondary | ICD-10-CM | POA: Diagnosis not present

## 2020-11-14 DIAGNOSIS — Z953 Presence of xenogenic heart valve: Secondary | ICD-10-CM | POA: Diagnosis not present

## 2020-11-14 DIAGNOSIS — Z951 Presence of aortocoronary bypass graft: Secondary | ICD-10-CM

## 2020-11-14 DIAGNOSIS — Z5181 Encounter for therapeutic drug level monitoring: Secondary | ICD-10-CM

## 2020-11-14 LAB — POCT INR: INR: 4 — AB (ref 2.0–3.0)

## 2020-11-14 NOTE — Patient Instructions (Signed)
Description   Hold warfarin today and then start taking Warfarin 1 tablet (7.5mg ) daily except 1/2 tablet (3.75mg ) on Mondays.  Recheck INR in 2 weeks. [facility] [phone] Main [phone].

## 2020-11-21 ENCOUNTER — Ambulatory Visit (INDEPENDENT_AMBULATORY_CARE_PROVIDER_SITE_OTHER): Payer: Medicare HMO

## 2020-11-21 DIAGNOSIS — I442 Atrioventricular block, complete: Secondary | ICD-10-CM | POA: Diagnosis not present

## 2020-11-21 IMAGING — CR DG CHEST 2V
2 series · 2 of 2 positions shown · non-contrast
Comparison: No priors.

CLINICAL DATA: 67-year-old male under preoperative evaluation prior
to heart surgery.

EXAM:
CHEST - 2 VIEW

[w chest pa]
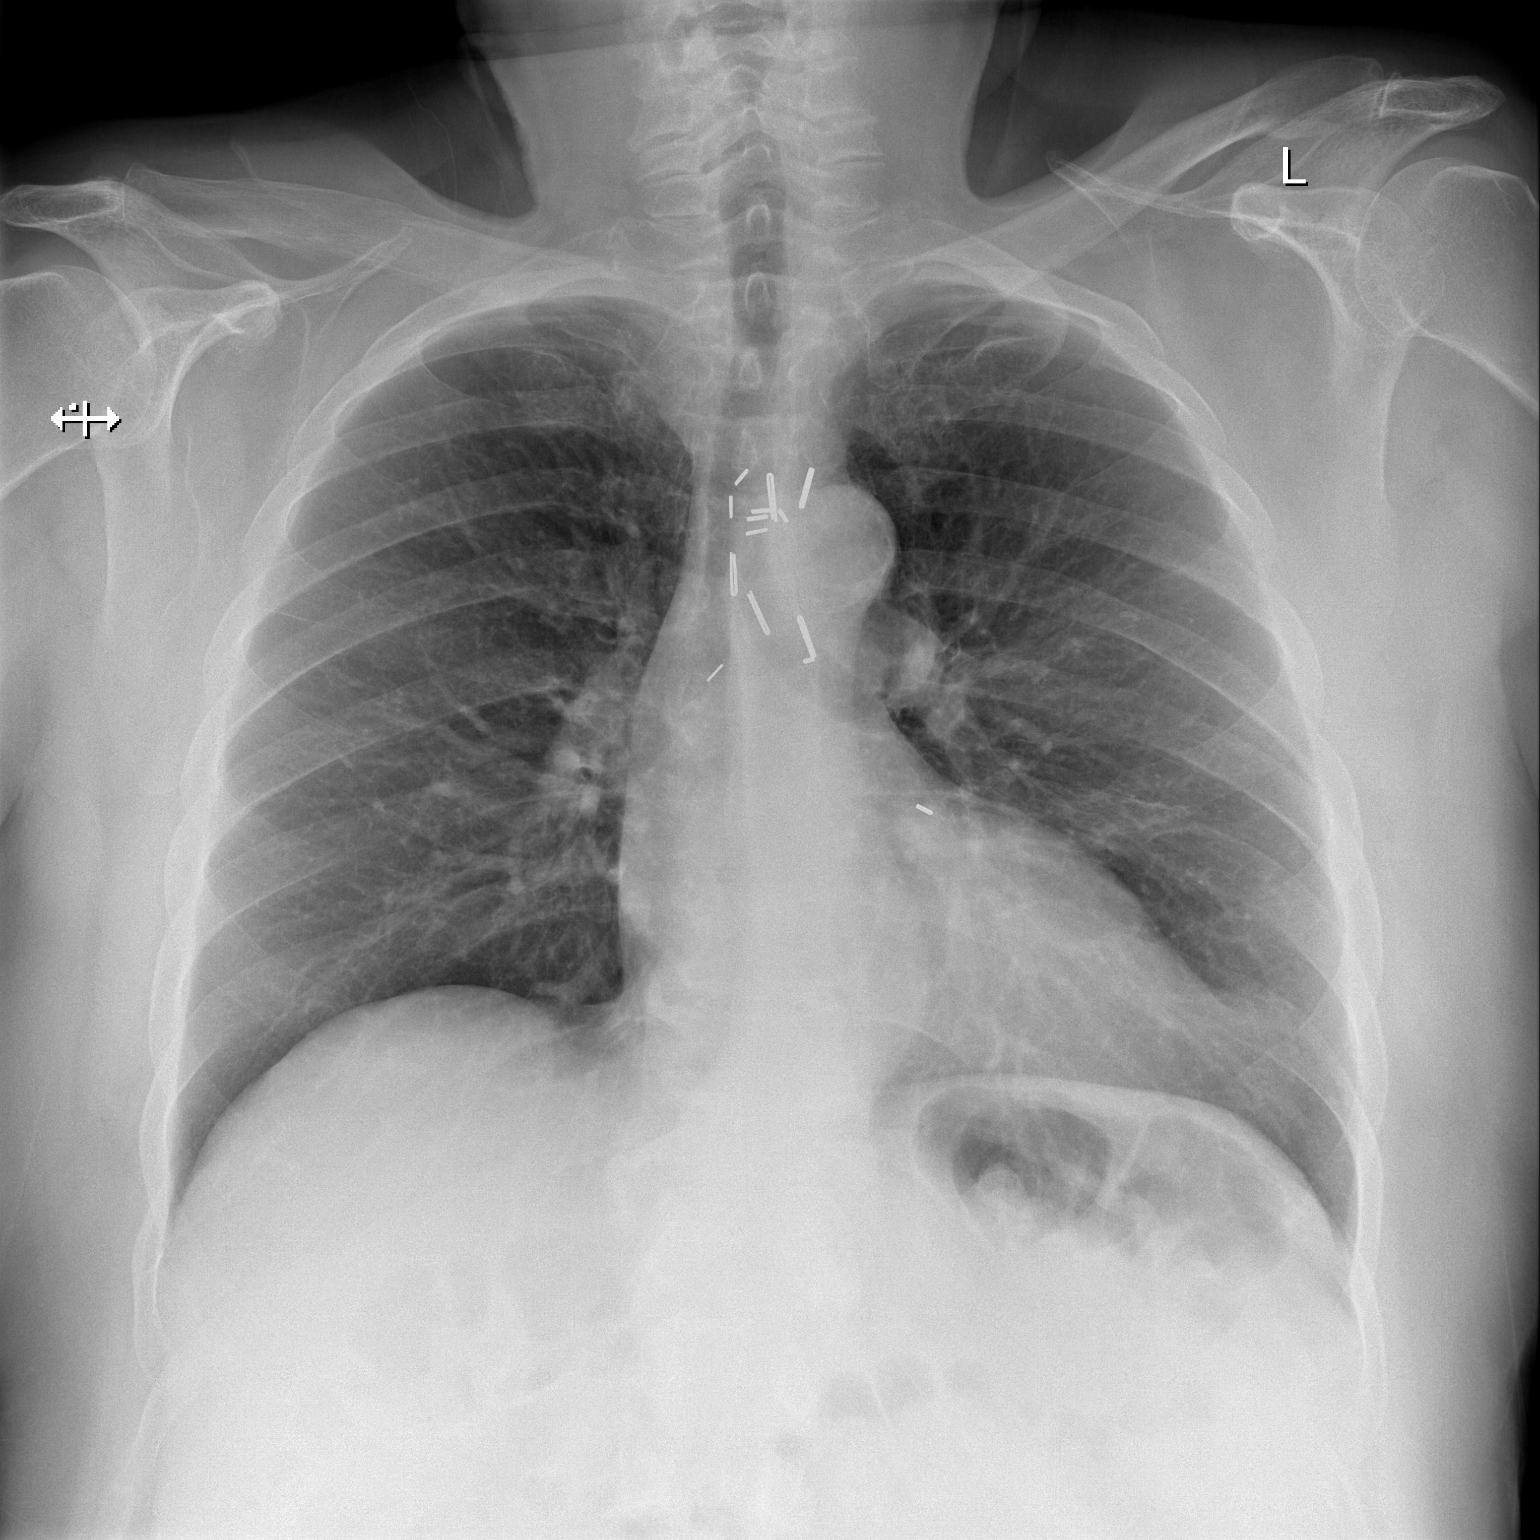

[w chest lat]
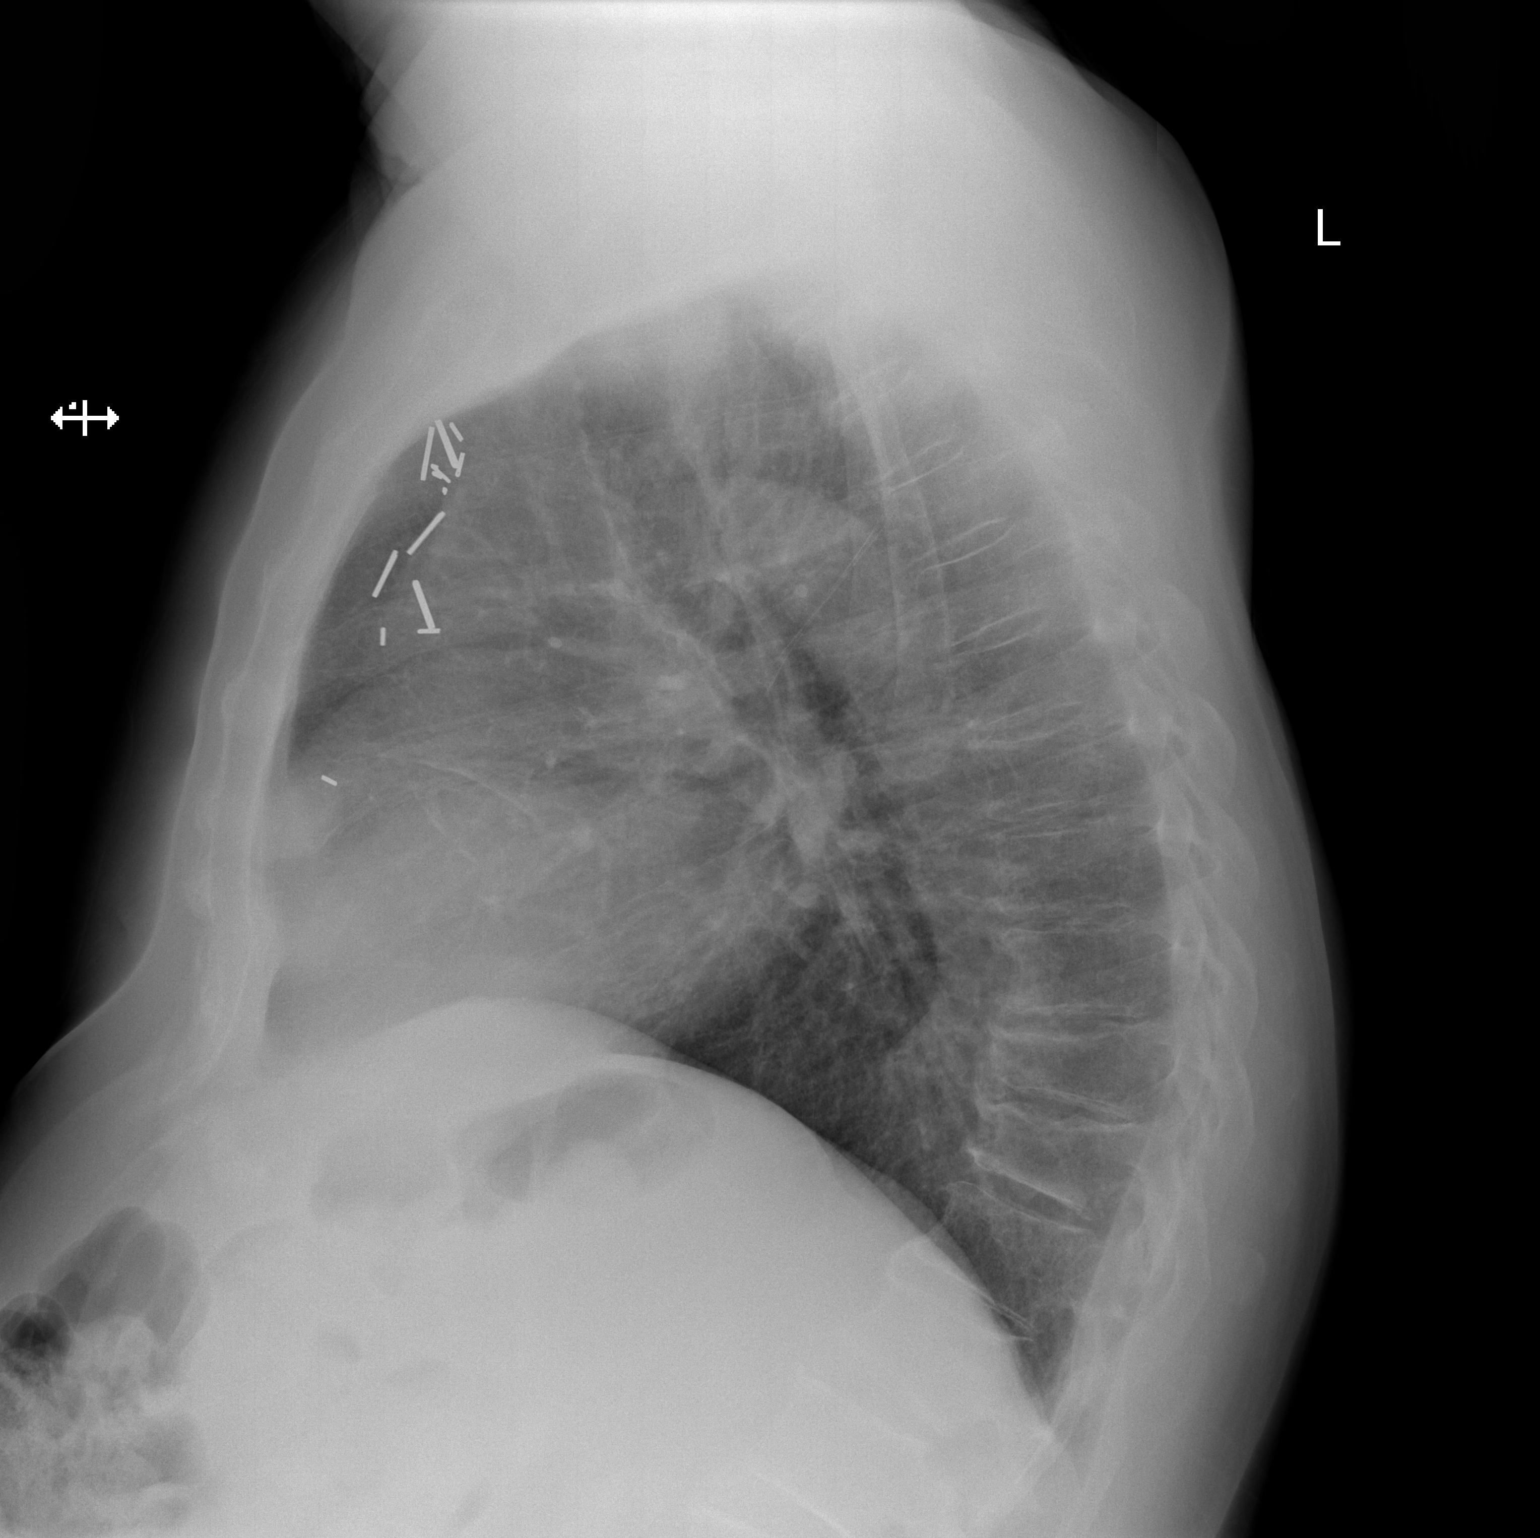

[2 of 2 positions shown; findings below may reference images not displayed]

FINDINGS: Lung volumes are normal. No consolidative airspace disease. No
pleural effusions. No pneumothorax. No pulmonary nodule or mass
noted. Pulmonary vasculature and the cardiomediastinal silhouette
are within normal limits. Surgical clips project over the anterior
mediastinum.
IMPRESSION: No radiographic evidence of acute cardiopulmonary disease.

## 2020-11-24 ENCOUNTER — Other Ambulatory Visit: Payer: Self-pay | Admitting: Cardiovascular Disease

## 2020-11-24 LAB — CUP PACEART REMOTE DEVICE CHECK
Battery Remaining Longevity: 118 mo
Battery Voltage: 3.01 V
Brady Statistic AP VP Percent: 0.45 %
Brady Statistic AP VS Percent: 0.5 %
Brady Statistic AS VP Percent: 45.2 %
Brady Statistic AS VS Percent: 53.85 %
Brady Statistic RA Percent Paced: 0.81 %
Brady Statistic RV Percent Paced: 45.65 %
Date Time Interrogation Session: 20221014082038
Implantable Lead Implant Date: 20200908
Implantable Lead Implant Date: 20200908
Implantable Lead Location: 753859
Implantable Lead Location: 753860
Implantable Lead Model: 3830
Implantable Lead Model: 5076
Implantable Pulse Generator Implant Date: 20200908
Lead Channel Impedance Value: 285 Ohm
Lead Channel Impedance Value: 304 Ohm
Lead Channel Impedance Value: 323 Ohm
Lead Channel Impedance Value: 475 Ohm
Lead Channel Pacing Threshold Amplitude: 0.625 V
Lead Channel Pacing Threshold Amplitude: 1.625 V
Lead Channel Pacing Threshold Pulse Width: 0.4 ms
Lead Channel Pacing Threshold Pulse Width: 0.4 ms
Lead Channel Sensing Intrinsic Amplitude: 2.75 mV
Lead Channel Sensing Intrinsic Amplitude: 2.75 mV
Lead Channel Sensing Intrinsic Amplitude: 9.5 mV
Lead Channel Sensing Intrinsic Amplitude: 9.5 mV
Lead Channel Setting Pacing Amplitude: 1.5 V
Lead Channel Setting Pacing Amplitude: 2.5 V
Lead Channel Setting Pacing Pulse Width: 0.8 ms
Lead Channel Setting Sensing Sensitivity: 1.2 mV

## 2020-11-24 IMAGING — DX DG CHEST 1V PORT
1 series · 1 of 1 positions shown · non-contrast
Comparison: Radiograph October 11, 2018.

CLINICAL DATA: Status post coronary bypass graft.

EXAM:
PORTABLE CHEST 1 VIEW

[chest]
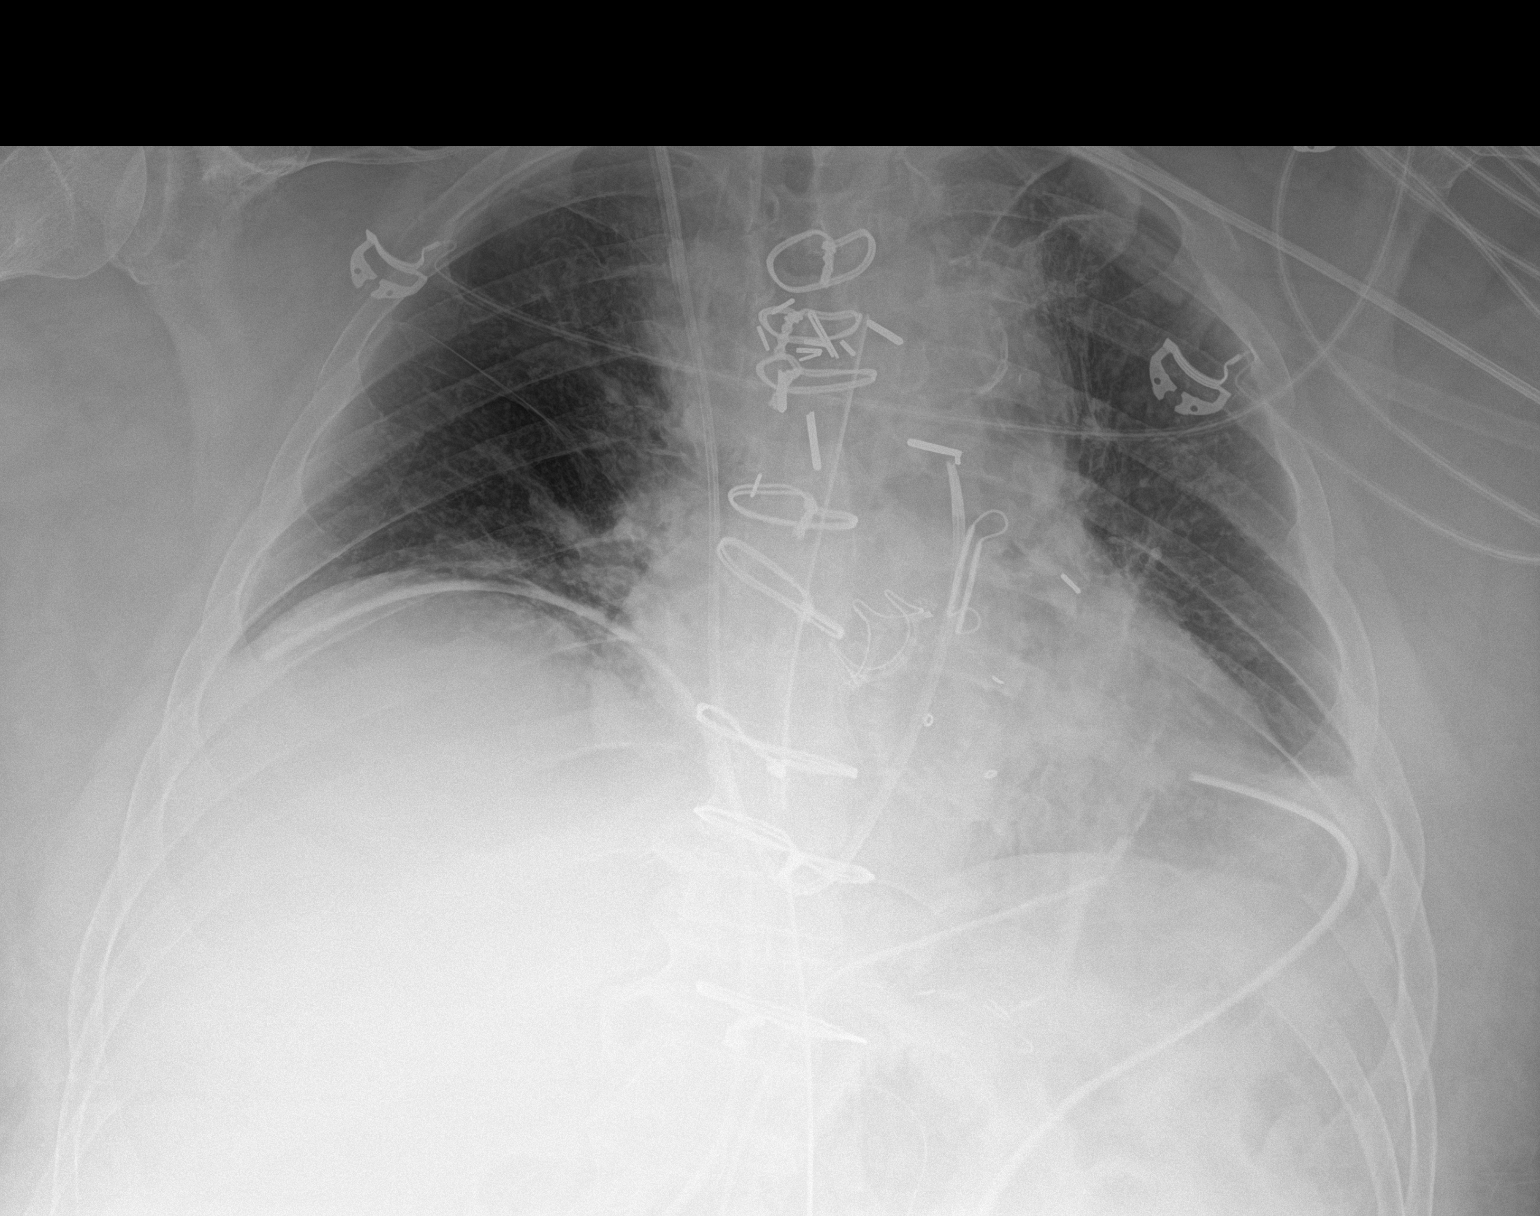

[1 of 1 positions shown; findings below may reference images not displayed]

FINDINGS: Stable cardiomegaly. Endotracheal and nasogastric tubes have been
removed. Right internal jugular Swan-Ganz catheter is unchanged.
Stable bilateral chest tubes are noted. No pneumothorax is noted.
Mild bibasilar subsegmental atelectasis is noted. Bony thorax is
unremarkable.
IMPRESSION: Endotracheal and nasogastric tubes have been removed. Bilateral
chest tubes are noted without pneumothorax. Mild bibasilar
subsegmental atelectasis is noted.

## 2020-11-28 ENCOUNTER — Other Ambulatory Visit: Payer: Self-pay

## 2020-11-28 ENCOUNTER — Ambulatory Visit (INDEPENDENT_AMBULATORY_CARE_PROVIDER_SITE_OTHER): Payer: Medicare HMO

## 2020-11-28 DIAGNOSIS — Z953 Presence of xenogenic heart valve: Secondary | ICD-10-CM | POA: Diagnosis not present

## 2020-11-28 DIAGNOSIS — Z5181 Encounter for therapeutic drug level monitoring: Secondary | ICD-10-CM | POA: Diagnosis not present

## 2020-11-28 DIAGNOSIS — Z8774 Personal history of (corrected) congenital malformations of heart and circulatory system: Secondary | ICD-10-CM

## 2020-11-28 DIAGNOSIS — Z951 Presence of aortocoronary bypass graft: Secondary | ICD-10-CM

## 2020-11-28 LAB — POCT INR: INR: 2.5 (ref 2.0–3.0)

## 2020-11-28 NOTE — Patient Instructions (Signed)
Description   Continue taking Warfarin 1 tablet (7.5mg ) daily except 1/2 tablet (3.75mg ) on Mondays.  Recheck INR in 4 weeks. [facility] [phone] Main [phone].

## 2020-11-28 NOTE — Progress Notes (Signed)
Remote pacemaker transmission.   

## 2020-12-02 DIAGNOSIS — L570 Actinic keratosis: Secondary | ICD-10-CM | POA: Diagnosis not present

## 2020-12-10 ENCOUNTER — Other Ambulatory Visit: Payer: Self-pay | Admitting: Cardiovascular Disease

## 2020-12-11 NOTE — Telephone Encounter (Signed)
Prescription refill request received for warfarin Lov: 08/22/20 [name])  Next INR check: 12/26/20 Warfarin tablet strength: 7.5mg   Appropriate dose and refill sent to requested pharmacy.

## 2020-12-26 ENCOUNTER — Ambulatory Visit (INDEPENDENT_AMBULATORY_CARE_PROVIDER_SITE_OTHER): Payer: Medicare HMO

## 2020-12-26 ENCOUNTER — Other Ambulatory Visit: Payer: Self-pay

## 2020-12-26 DIAGNOSIS — Z953 Presence of xenogenic heart valve: Secondary | ICD-10-CM | POA: Diagnosis not present

## 2020-12-26 DIAGNOSIS — Z951 Presence of aortocoronary bypass graft: Secondary | ICD-10-CM

## 2020-12-26 DIAGNOSIS — Z8774 Personal history of (corrected) congenital malformations of heart and circulatory system: Secondary | ICD-10-CM | POA: Diagnosis not present

## 2020-12-26 DIAGNOSIS — Z5181 Encounter for therapeutic drug level monitoring: Secondary | ICD-10-CM | POA: Diagnosis not present

## 2020-12-26 LAB — POCT INR: INR: 2.7 (ref 2.0–3.0)

## 2020-12-26 IMAGING — DX DG CHEST 2V
2 series · 2 of 2 positions shown · non-contrast
Comparison: Chest radiograph 10/18/2018, 10/16/2018

CLINICAL DATA: S/p CABG 10/11/2018, no chest complaints today, stat
reading, pt gone to office now for appt

EXAM:
CHEST - 2 VIEW

[dg chest 2 view (1 of 2)]
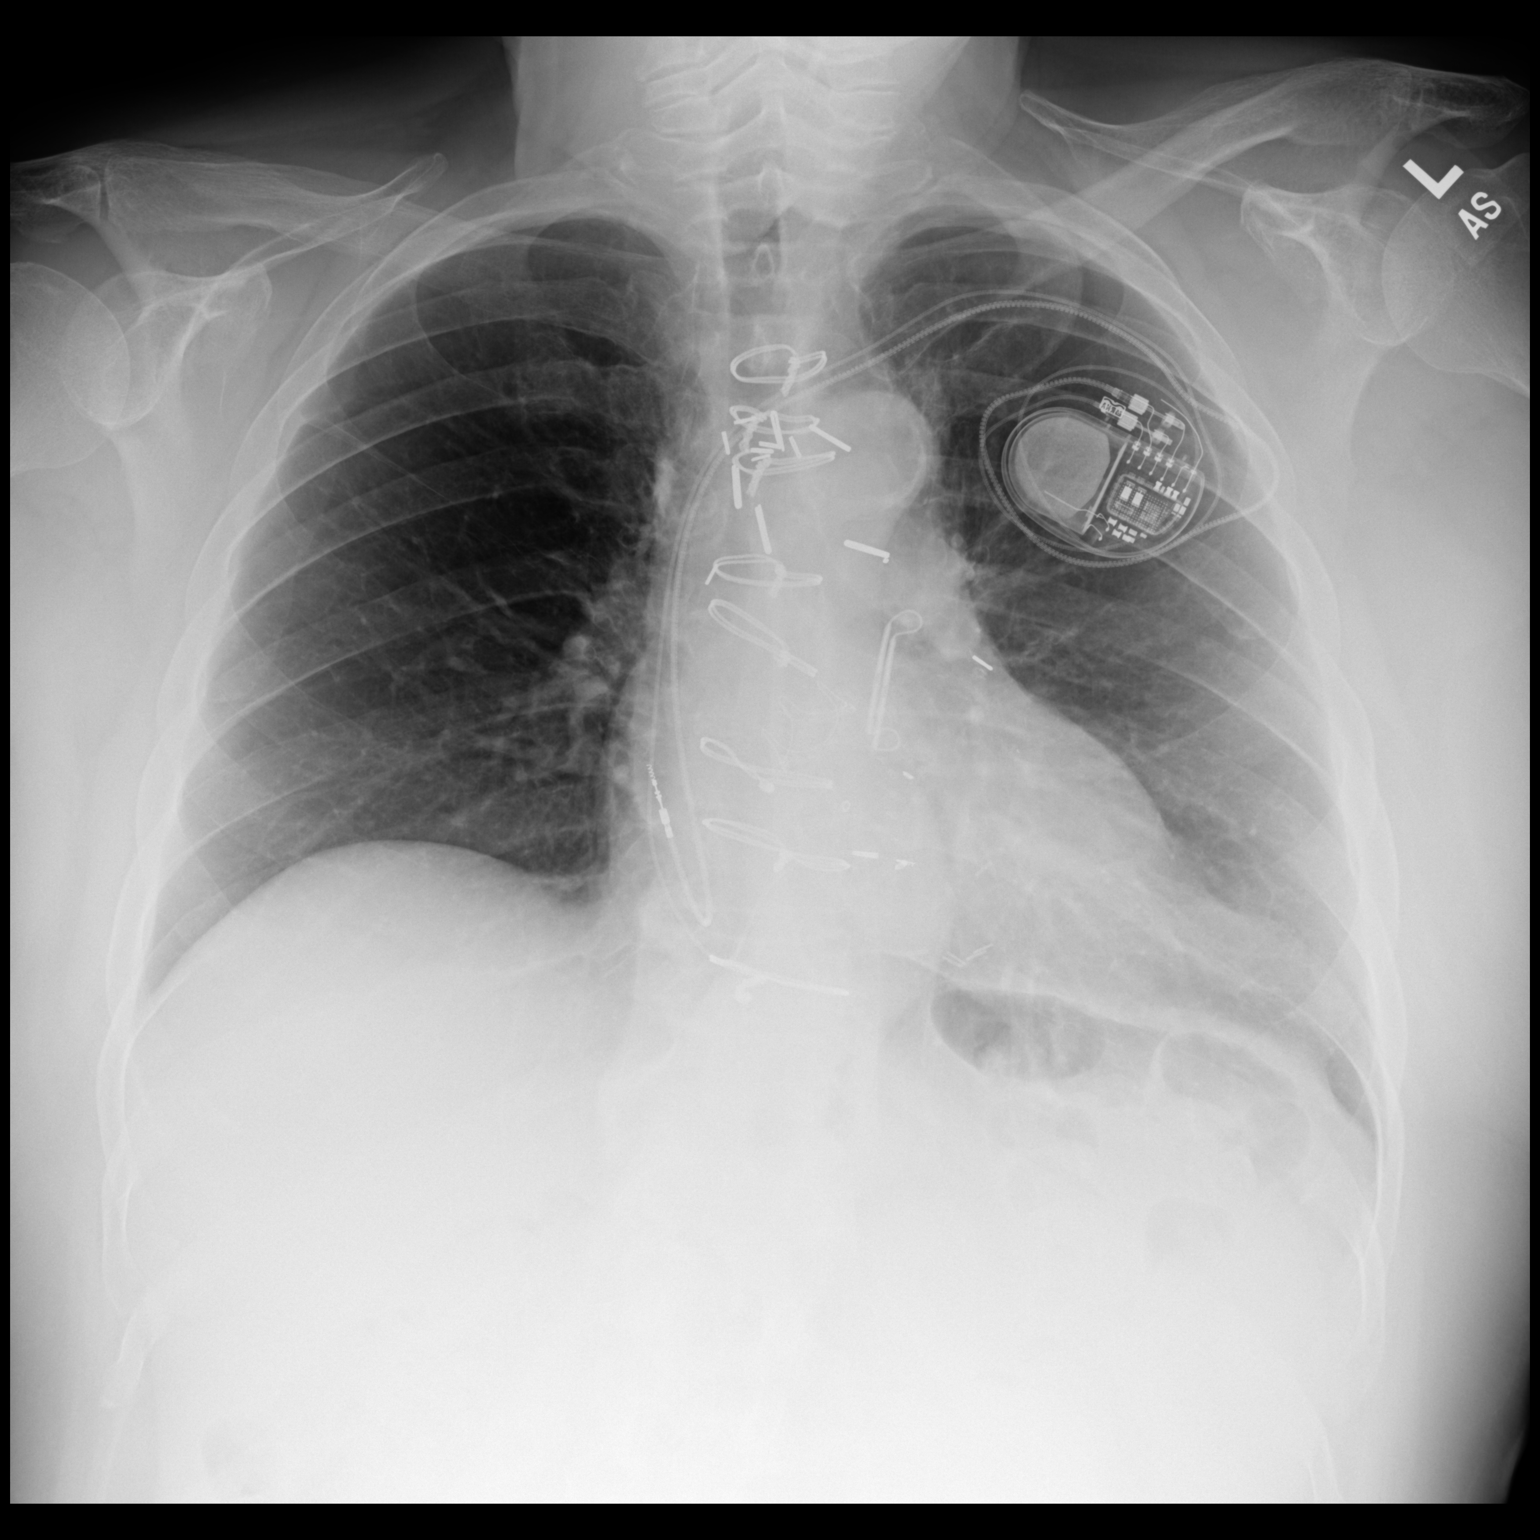

[dg chest 2 view (2 of 2)]
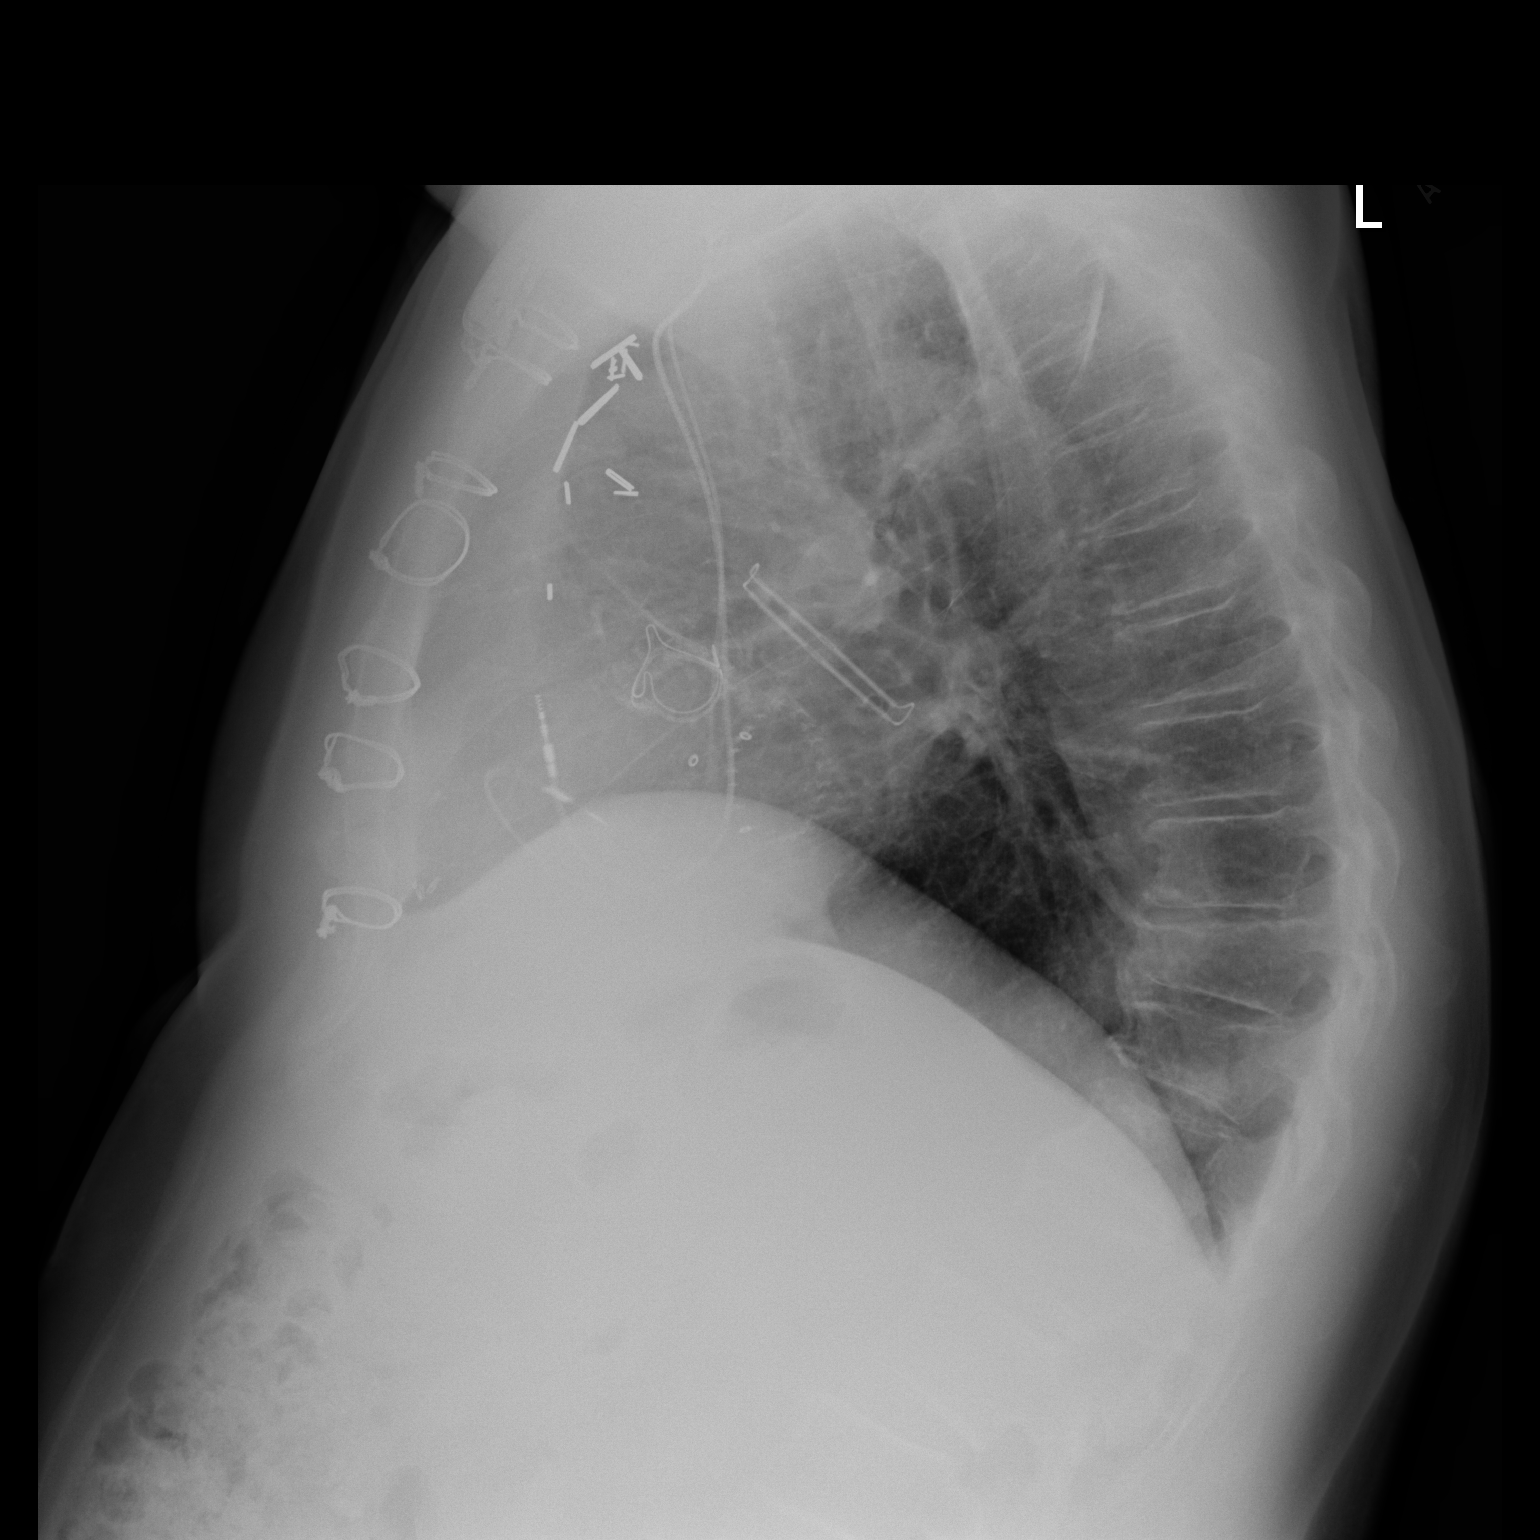

[2 of 2 positions shown; findings below may reference images not displayed]

FINDINGS: Stable cardiomediastinal contours status post median sternotomy and
CABG. Left chest ICD in place. No definite residual right-sided
pneumothorax. Mild opacities at the left base likely reflect
atelectasis. Right lung is clear. No large pleural effusion. No
acute finding in the visualized skeleton.
IMPRESSION: 1. No definite residual right-sided pneumothorax.
2. Minimal left basilar atelectasis or scarring.

## 2020-12-26 NOTE — Patient Instructions (Signed)
Continue taking Warfarin 1 tablet (7.5mg ) daily except 1/2 tablet (3.75mg ) on Mondays.  Recheck INR in 5 weeks. [facility] [phone] Main [phone].

## 2021-01-05 DIAGNOSIS — C61 Malignant neoplasm of prostate: Secondary | ICD-10-CM | POA: Diagnosis not present

## 2021-01-19 ENCOUNTER — Other Ambulatory Visit: Payer: Self-pay | Admitting: Cardiovascular Disease

## 2021-01-30 ENCOUNTER — Other Ambulatory Visit: Payer: Self-pay

## 2021-01-30 ENCOUNTER — Ambulatory Visit (INDEPENDENT_AMBULATORY_CARE_PROVIDER_SITE_OTHER): Payer: Medicare HMO | Admitting: *Deleted

## 2021-01-30 DIAGNOSIS — Z5181 Encounter for therapeutic drug level monitoring: Secondary | ICD-10-CM | POA: Diagnosis not present

## 2021-01-30 DIAGNOSIS — Z953 Presence of xenogenic heart valve: Secondary | ICD-10-CM | POA: Diagnosis not present

## 2021-01-30 DIAGNOSIS — Z8774 Personal history of (corrected) congenital malformations of heart and circulatory system: Secondary | ICD-10-CM | POA: Diagnosis not present

## 2021-01-30 DIAGNOSIS — Z951 Presence of aortocoronary bypass graft: Secondary | ICD-10-CM

## 2021-01-30 LAB — POCT INR: INR: 2.6 (ref 2.0–3.0)

## 2021-01-30 NOTE — Patient Instructions (Signed)
Description   Continue taking Warfarin 1 tablet (7.5mg ) daily except 1/2 tablet (3.75mg ) on Mondays.  Recheck INR in 6 weeks. [facility] [phone] Main [phone].

## 2021-02-20 ENCOUNTER — Ambulatory Visit (INDEPENDENT_AMBULATORY_CARE_PROVIDER_SITE_OTHER): Payer: Medicare HMO

## 2021-02-20 DIAGNOSIS — I442 Atrioventricular block, complete: Secondary | ICD-10-CM | POA: Diagnosis not present

## 2021-02-20 LAB — CUP PACEART REMOTE DEVICE CHECK
Battery Remaining Longevity: 112 mo
Battery Voltage: 2.99 V
Brady Statistic AP VP Percent: 0.98 %
Brady Statistic AP VS Percent: 0.79 %
Brady Statistic AS VP Percent: 43.96 %
Brady Statistic AS VS Percent: 54.27 %
Brady Statistic RA Percent Paced: 1.21 %
Brady Statistic RV Percent Paced: 44.94 %
Date Time Interrogation Session: 20230112195813
Implantable Lead Implant Date: 20200908
Implantable Lead Implant Date: 20200908
Implantable Lead Location: 753859
Implantable Lead Location: 753860
Implantable Lead Model: 3830
Implantable Lead Model: 5076
Implantable Pulse Generator Implant Date: 20200908
Lead Channel Impedance Value: 285 Ohm
Lead Channel Impedance Value: 304 Ohm
Lead Channel Impedance Value: 399 Ohm
Lead Channel Impedance Value: 437 Ohm
Lead Channel Pacing Threshold Amplitude: 0.75 V
Lead Channel Pacing Threshold Amplitude: 1.5 V
Lead Channel Pacing Threshold Pulse Width: 0.4 ms
Lead Channel Pacing Threshold Pulse Width: 0.4 ms
Lead Channel Sensing Intrinsic Amplitude: 10.125 mV
Lead Channel Sensing Intrinsic Amplitude: 10.125 mV
Lead Channel Sensing Intrinsic Amplitude: 2.625 mV
Lead Channel Sensing Intrinsic Amplitude: 2.625 mV
Lead Channel Setting Pacing Amplitude: 1.5 V
Lead Channel Setting Pacing Amplitude: 2.5 V
Lead Channel Setting Pacing Pulse Width: 0.8 ms
Lead Channel Setting Sensing Sensitivity: 1.2 mV

## 2021-03-03 NOTE — Progress Notes (Signed)
Remote pacemaker transmission.   

## 2021-03-13 ENCOUNTER — Ambulatory Visit (INDEPENDENT_AMBULATORY_CARE_PROVIDER_SITE_OTHER): Payer: Medicare HMO | Admitting: *Deleted

## 2021-03-13 ENCOUNTER — Other Ambulatory Visit: Payer: Self-pay

## 2021-03-13 DIAGNOSIS — Z5181 Encounter for therapeutic drug level monitoring: Secondary | ICD-10-CM | POA: Diagnosis not present

## 2021-03-13 DIAGNOSIS — Z8774 Personal history of (corrected) congenital malformations of heart and circulatory system: Secondary | ICD-10-CM | POA: Diagnosis not present

## 2021-03-13 DIAGNOSIS — Z953 Presence of xenogenic heart valve: Secondary | ICD-10-CM

## 2021-03-13 DIAGNOSIS — Z951 Presence of aortocoronary bypass graft: Secondary | ICD-10-CM

## 2021-03-13 LAB — POCT INR: INR: 2.7 (ref 2.0–3.0)

## 2021-03-13 NOTE — Patient Instructions (Signed)
Description   Continue taking Warfarin 1 tablet (7.5mg ) daily except 1/2 tablet (3.75mg ) on Mondays.  Recheck INR in 6 weeks. [facility] [phone] Main [phone].

## 2021-04-24 ENCOUNTER — Other Ambulatory Visit: Payer: Self-pay

## 2021-04-24 ENCOUNTER — Ambulatory Visit (INDEPENDENT_AMBULATORY_CARE_PROVIDER_SITE_OTHER): Payer: Medicare HMO

## 2021-04-24 DIAGNOSIS — Z5181 Encounter for therapeutic drug level monitoring: Secondary | ICD-10-CM

## 2021-04-24 DIAGNOSIS — Z953 Presence of xenogenic heart valve: Secondary | ICD-10-CM

## 2021-04-24 DIAGNOSIS — Z8774 Personal history of (corrected) congenital malformations of heart and circulatory system: Secondary | ICD-10-CM | POA: Diagnosis not present

## 2021-04-24 DIAGNOSIS — Z951 Presence of aortocoronary bypass graft: Secondary | ICD-10-CM

## 2021-04-24 LAB — POCT INR: INR: 2.4 (ref 2.0–3.0)

## 2021-04-24 NOTE — Patient Instructions (Signed)
Continue taking Warfarin 1 tablet (7.'5mg'$ ) daily except 1/2 tablet (3.'75mg'$ ) on Mondays.  Recheck INR in 6 weeks. [facility] [phone] Main [phone]. ?

## 2021-04-27 ENCOUNTER — Other Ambulatory Visit: Payer: Self-pay | Admitting: Cardiovascular Disease

## 2021-05-05 ENCOUNTER — Encounter: Payer: Self-pay | Admitting: Family Medicine

## 2021-05-05 ENCOUNTER — Ambulatory Visit (INDEPENDENT_AMBULATORY_CARE_PROVIDER_SITE_OTHER): Payer: Medicare HMO | Admitting: Family Medicine

## 2021-05-05 ENCOUNTER — Other Ambulatory Visit: Payer: Self-pay

## 2021-05-05 VITALS — BP 110/72 | HR 62 | Temp 96.8°F | Ht 67.5 in | Wt 239.0 lb

## 2021-05-05 DIAGNOSIS — C61 Malignant neoplasm of prostate: Secondary | ICD-10-CM

## 2021-05-05 DIAGNOSIS — E782 Mixed hyperlipidemia: Secondary | ICD-10-CM | POA: Diagnosis not present

## 2021-05-05 DIAGNOSIS — E669 Obesity, unspecified: Secondary | ICD-10-CM | POA: Diagnosis not present

## 2021-05-05 DIAGNOSIS — Z951 Presence of aortocoronary bypass graft: Secondary | ICD-10-CM

## 2021-05-05 DIAGNOSIS — E291 Testicular hypofunction: Secondary | ICD-10-CM

## 2021-05-05 DIAGNOSIS — Z23 Encounter for immunization: Secondary | ICD-10-CM | POA: Diagnosis not present

## 2021-05-05 DIAGNOSIS — Z953 Presence of xenogenic heart valve: Secondary | ICD-10-CM | POA: Diagnosis not present

## 2021-05-05 DIAGNOSIS — Z0000 Encounter for general adult medical examination without abnormal findings: Secondary | ICD-10-CM

## 2021-05-05 DIAGNOSIS — Z950 Presence of cardiac pacemaker: Secondary | ICD-10-CM

## 2021-05-05 DIAGNOSIS — I700 Atherosclerosis of aorta: Secondary | ICD-10-CM | POA: Diagnosis not present

## 2021-05-05 NOTE — Progress Notes (Signed)
[name] is a 71 y.o. male who presents for annual wellness visit and follow-up on chronic medical conditions.  He has he has had CABG graft, aortic and mitral valve replacement.  He now has a pacer in place.  Does have a history of aortic atherosclerosis.  Also has evidence of aortic ectasia.  ?He also has underlying prostate cancer and has had seeds placed.  Does have a history of hypogonadism but presently is on no testosterone replacement.  His medications were reviewed.  He continues on Coumadin, metoprolol and is also taking atorvastatin.  He remains smoke-free.  His marriage is going well. ? ?Immunizations and Health Maintenance ?Immunization History  ?Administered Date(s) Administered  ? DTaP 10/31/1995  ? Fluad Quad(high Dose 65+) 10/20/2018  ? Influenza Split 10/23/2010, 12/27/2011  ? Influenza, High Dose Seasonal PF 11/05/2015, 04/26/2017  ? Influenza,inj,Quad PF,6+ Mos 12/17/2013  ? Influenza-Unspecified 11/26/2020  ? Janssen (J&J) SARS-COV-2 Vaccination 05/21/2019, 02/27/2020  ? PPD Test 11/05/2015  ? Pneumococcal Conjugate-13 11/05/2015  ? Pneumococcal Polysaccharide-23 10/23/2010  ? Tdap 12/27/2011  ? Zoster, Live 10/23/2010  ? ?Health Maintenance Due  ?Topic Date Due  ? Zoster Vaccines- Shingrix (1 of 2) Never done  ? ? ?Last colonoscopy: 06/01/13 Dr. Amedeo Plenty  ?Last PSA: 06/26/20(0.096) Dr.Winters ?Dentist: Q six months ?Ophtho: Q year ?Exercise: N/A ? ?Other doctors caring for patient include: Dr. Amedeo Plenty GI ?           Dr. Lovena Neighbours urology ?           Dr. Angelena Form cardiology ? ?Advanced Directives: ?Does Patient Have a Medical Advance Directive?: No ?Would patient like information on creating a medical advance directive?: Yes (ED - Information included in AVS) ? ?Depression screen:  See questionnaire below.   ?  ? ?  05/05/2021  ?  9:31 AM 04/30/2020  ?  9:49 AM 04/30/2019  ?  9:47 AM 04/28/2018  ?  9:23 AM 04/26/2017  ? 11:10 AM  ?Depression screen PHQ 2/9  ?Decreased Interest 0 0 0 0 0  ?Down,  Depressed, Hopeless 0 0 0 0 0  ?PHQ - 2 Score 0 0 0 0 0  ? ? ?Fall Screen: See Questionaire below. ?  ? ?  05/05/2021  ?  9:31 AM 04/30/2020  ?  9:49 AM 04/30/2019  ?  9:46 AM 04/28/2018  ?  9:23 AM 04/26/2017  ? 11:10 AM  ?Fall Risk   ?Falls in the past year? 0 0 0 0 No  ?Number falls in past yr: 0 0     ?Injury with Fall? 0 0     ?Risk for fall due to : No Fall Risks No Fall Risks     ?Follow up Falls evaluation completed Falls evaluation completed     ? ? ?ADL screen:  See questionnaire below.  ?Functional Status Survey: ?Is the patient deaf or have difficulty hearing?: No ?Does the patient have difficulty seeing, even when wearing glasses/contacts?: No ?Does the patient have difficulty concentrating, remembering, or making decisions?: No ?Does the patient have difficulty walking or climbing stairs?: No ?Does the patient have difficulty dressing or bathing?: No ?Does the patient have difficulty doing errands alone such as visiting a doctor's office or shopping?: No ? ? ?Review of Systems ? ?Constitutional: -, -unexpected weight change, -anorexia, -fatigue ?Allergy: -sneezing, -itching, -congestion ?Dermatology: denies changing moles, rash, lumps ?ENT: -runny nose, -ear pain, -sore throat,  ?Cardiology:  -chest pain, -palpitations, -orthopnea, ?Respiratory: -cough, -shortness of breath, -dyspnea on exertion, -  wheezing,  ?Gastroenterology: -abdominal pain, -nausea, -vomiting, -diarrhea, -constipation, -dysphagia ?Hematology: -bleeding or bruising problems ?Musculoskeletal: -arthralgias, -myalgias, -joint swelling, -back pain, - ?Ophthalmology: -vision changes,  ?Urology: -dysuria, -difficulty urinating,  -urinary frequency, -urgency, incontinence ?Neurology: -, -numbness, , -memory loss, -falls, -dizziness ? ? ? ?PHYSICAL EXAM: ? ?General Appearance: Alert, cooperative, no distress, appears stated age ?Head: Normocephalic, without obvious abnormality, atraumatic ?Eyes: PERRL, conjunctiva/corneas clear, EOM's  intact ?Ears: Normal TM's and external ear canals ?Nose: Nares normal, mucosa normal, no drainage or sinus   tenderness ?Throat: Lips, mucosa, and tongue normal; teeth and gums normal ?Neck: Supple, no lymphadenopathy, thyroid:no enlargement/tenderness/nodules; no carotid bruit or JVD ?Lungs: Clear to auscultation bilaterally without wheezes, rales or ronchi; respirations unlabored ?Heart: irregular rate and rhythm, S1 and S2 normal, no murmur, rub or gallop ?Abdomen: Soft, non-tender, nondistended, normoactive bowel sounds, no masses, no hepatosplenomegaly ?Extremities: No clubbing, cyanosis or edema ?Skin: Skin color, texture, turgor normal, no rashes or lesions ?Lymph nodes: Cervical, supraclavicular, and axillary nodes normal ?Neurologic: CNII-XII intact, normal strength, sensation and gait; reflexes 2+ and symmetric throughout   ?Psych: Normal mood, affect, hygiene and grooming ? ?ASSESSMENT/PLAN: ?S/P CABG x 2 - Plan: CBC with Differential/Platelet, Comprehensive metabolic panel, Lipid panel ? ?Obesity (BMI 30-39.9) - Plan: CBC with Differential/Platelet, Comprehensive metabolic panel, Lipid panel ? ?S/P aortic + mitral valve replacement with bioprosthetic valves + CABG x2 ? ?Malignant neoplasm of prostate ([place]) ? ?Atherosclerosis of aorta ([place]) ? ?Hypogonadism in male ? ?Pacemaker ? ?Need for COVID-19 vaccine - Plan: Pension scheme manager ? ?Mixed hyperlipidemia - Plan: Lipid panel ?He will continue on his present medication regimen.  Recommended he discuss with urology the possible use of testosterone with his underlying history of prostate cancer.  He is also to discuss the aortic ectasia with cardiology to see when that might need to be followed up upon.  Recommend he get Shingrix as well as Tdap. ? ? ?Discussed  at least 30 minutes of aerobic activity at least 5 days/week; proper sunscreen use reviewed; healthy diet . Immunization recommendations discussed.  Colonoscopy recommendations  reviewed. ?Briefly discussed weight loss with him ? ?Medicare Attestation ?I have personally reviewed: ?The patient's medical and social history ?Their use of alcohol, tobacco or illicit drugs ?Their current medications and supplements ?The patient's functional ability including ADLs,fall risks, home safety risks, cognitive, and hearing and visual impairment ?Diet and physical activities ?Evidence for depression or mood disorders ? ?The patient's weight, height, and BMI have been recorded in the chart.  I have made referrals, counseling, and provided education to the patient based on review of the above and I have provided the patient with a written personalized care plan for preventive services.   ? ? ?Jill Alexanders, MD   05/05/2021  ? ? ?  ?

## 2021-05-06 LAB — COMPREHENSIVE METABOLIC PANEL
ALT: 20 IU/L (ref 0–44)
AST: 19 IU/L (ref 0–40)
Albumin/Globulin Ratio: 1.8 (ref 1.2–2.2)
Albumin: 4.5 g/dL (ref 3.8–4.8)
Alkaline Phosphatase: 106 IU/L (ref 44–121)
BUN/Creatinine Ratio: 15 (ref 10–24)
BUN: 15 mg/dL (ref 8–27)
Bilirubin Total: 0.9 mg/dL (ref 0.0–1.2)
CO2: 20 mmol/L (ref 20–29)
Calcium: 9.4 mg/dL (ref 8.6–10.2)
Chloride: 106 mmol/L (ref 96–106)
Creatinine, Ser: 1.03 mg/dL (ref 0.76–1.27)
Globulin, Total: 2.5 g/dL (ref 1.5–4.5)
Glucose: 108 mg/dL — ABNORMAL HIGH (ref 70–99)
Potassium: 4.3 mmol/L (ref 3.5–5.2)
Sodium: 143 mmol/L (ref 134–144)
Total Protein: 7 g/dL (ref 6.0–8.5)
eGFR: 78 mL/min/{1.73_m2} (ref >59)

## 2021-05-06 LAB — CBC WITH DIFFERENTIAL/PLATELET
Basophils Absolute: 0.1 10*3/uL (ref 0.0–0.2)
Basos: 1 %
EOS (ABSOLUTE): 0.2 10*3/uL (ref 0.0–0.4)
Eos: 2 %
Hematocrit: 45.2 % (ref 37.5–51.0)
Hemoglobin: 15.2 g/dL (ref 13.0–17.7)
Immature Grans (Abs): 0 10*3/uL (ref 0.0–0.1)
Immature Granulocytes: 0 %
Lymphocytes Absolute: 1.2 10*3/uL (ref 0.7–3.1)
Lymphs: 18 %
MCH: 27.8 pg (ref 26.6–33.0)
MCHC: 33.6 g/dL (ref 31.5–35.7)
MCV: 83 fL (ref 79–97)
Monocytes Absolute: 0.6 10*3/uL (ref 0.1–0.9)
Monocytes: 9 %
Neutrophils Absolute: 4.7 10*3/uL (ref 1.4–7.0)
Neutrophils: 70 %
Platelets: 189 10*3/uL (ref 150–450)
RBC: 5.46 x10E6/uL (ref 4.14–5.80)
RDW: 13.9 % (ref 11.6–15.4)
WBC: 6.8 10*3/uL (ref 3.4–10.8)

## 2021-05-06 LAB — LIPID PANEL
Chol/HDL Ratio: 2.2 ratio (ref 0.0–5.0)
Cholesterol, Total: 128 mg/dL (ref 100–199)
HDL: 58 mg/dL (ref >39)
LDL Chol Calc (NIH): 52 mg/dL (ref 0–99)
Triglycerides: 93 mg/dL (ref 0–149)
VLDL Cholesterol Cal: 18 mg/dL (ref 5–40)

## 2021-05-21 LAB — CUP PACEART REMOTE DEVICE CHECK
Battery Remaining Longevity: 111 mo
Battery Voltage: 3.02 V
Brady Statistic AP VP Percent: 3.34 %
Brady Statistic AP VS Percent: 3.59 %
Brady Statistic AS VP Percent: 6.25 %
Brady Statistic AS VS Percent: 86.82 %
Brady Statistic RA Percent Paced: 4.66 %
Brady Statistic RV Percent Paced: 9.59 %
Date Time Interrogation Session: 20230412225322
Implantable Lead Implant Date: 20200908
Implantable Lead Implant Date: 20200908
Implantable Lead Location: 753859
Implantable Lead Location: 753860
Implantable Lead Model: 3830
Implantable Lead Model: 5076
Implantable Pulse Generator Implant Date: 20200908
Lead Channel Impedance Value: 266 Ohm
Lead Channel Impedance Value: 304 Ohm
Lead Channel Impedance Value: 380 Ohm
Lead Channel Impedance Value: 418 Ohm
Lead Channel Pacing Threshold Amplitude: 0.625 V
Lead Channel Pacing Threshold Amplitude: 1.5 V
Lead Channel Pacing Threshold Pulse Width: 0.4 ms
Lead Channel Pacing Threshold Pulse Width: 0.4 ms
Lead Channel Sensing Intrinsic Amplitude: 2.375 mV
Lead Channel Sensing Intrinsic Amplitude: 2.375 mV
Lead Channel Sensing Intrinsic Amplitude: 9.375 mV
Lead Channel Sensing Intrinsic Amplitude: 9.375 mV
Lead Channel Setting Pacing Amplitude: 1.5 V
Lead Channel Setting Pacing Amplitude: 2.5 V
Lead Channel Setting Pacing Pulse Width: 0.8 ms
Lead Channel Setting Sensing Sensitivity: 1.2 mV

## 2021-05-22 ENCOUNTER — Ambulatory Visit (INDEPENDENT_AMBULATORY_CARE_PROVIDER_SITE_OTHER): Payer: Medicare HMO

## 2021-05-22 DIAGNOSIS — I442 Atrioventricular block, complete: Secondary | ICD-10-CM

## 2021-05-29 ENCOUNTER — Telehealth: Payer: Self-pay

## 2021-05-29 NOTE — Telephone Encounter (Signed)
MDT alert for NSVT ?2 NSVT, one 6 beats the other >20 beats occurring 4/20 @ 20:59, duration 7sec, HR 207 ?Route to triage ?LA ? ?Successful telephone encounter with patient to follow up on possible symptoms of 23 beat NSVT that occurred last even at approximately 9:00 pm. Patient unaware. Confirmed he continues to take metoprolol tartrate 25 mg BID. Continue to monitor.  ? ? ? ? ? ? ? ? ? ? ? ?

## 2021-06-02 DIAGNOSIS — L814 Other melanin hyperpigmentation: Secondary | ICD-10-CM | POA: Diagnosis not present

## 2021-06-02 DIAGNOSIS — D225 Melanocytic nevi of trunk: Secondary | ICD-10-CM | POA: Diagnosis not present

## 2021-06-02 DIAGNOSIS — L578 Other skin changes due to chronic exposure to nonionizing radiation: Secondary | ICD-10-CM | POA: Diagnosis not present

## 2021-06-02 DIAGNOSIS — L821 Other seborrheic keratosis: Secondary | ICD-10-CM | POA: Diagnosis not present

## 2021-06-02 DIAGNOSIS — L570 Actinic keratosis: Secondary | ICD-10-CM | POA: Diagnosis not present

## 2021-06-05 ENCOUNTER — Ambulatory Visit (INDEPENDENT_AMBULATORY_CARE_PROVIDER_SITE_OTHER): Payer: Medicare HMO | Admitting: *Deleted

## 2021-06-05 DIAGNOSIS — Z953 Presence of xenogenic heart valve: Secondary | ICD-10-CM | POA: Diagnosis not present

## 2021-06-05 DIAGNOSIS — Z5181 Encounter for therapeutic drug level monitoring: Secondary | ICD-10-CM

## 2021-06-05 DIAGNOSIS — Z951 Presence of aortocoronary bypass graft: Secondary | ICD-10-CM

## 2021-06-05 DIAGNOSIS — Z8774 Personal history of (corrected) congenital malformations of heart and circulatory system: Secondary | ICD-10-CM | POA: Diagnosis not present

## 2021-06-05 LAB — POCT INR: INR: 2.6 (ref 2.0–3.0)

## 2021-06-05 NOTE — Patient Instructions (Signed)
Description   ?Continue taking Warfarin 1 tablet (7.'5mg'$ ) daily except 1/2 tablet (3.'75mg'$ ) on Mondays.  Recheck INR in 6 weeks. [facility] [phone] Main [phone]. ?  ?  ?

## 2021-06-08 DIAGNOSIS — C61 Malignant neoplasm of prostate: Secondary | ICD-10-CM | POA: Diagnosis not present

## 2021-06-08 LAB — PSA: PSA: 0.027

## 2021-06-09 NOTE — Progress Notes (Signed)
Remote pacemaker transmission.   

## 2021-06-15 DIAGNOSIS — C61 Malignant neoplasm of prostate: Secondary | ICD-10-CM | POA: Diagnosis not present

## 2021-06-17 ENCOUNTER — Other Ambulatory Visit: Payer: Self-pay | Admitting: Cardiology

## 2021-06-17 DIAGNOSIS — Z953 Presence of xenogenic heart valve: Secondary | ICD-10-CM

## 2021-06-17 NOTE — Telephone Encounter (Addendum)
Prescription refill request received for warfarin ?Lov: 08/07/20 Angelena Form) ?Next INR check: 07/17/21 ?Warfarin tablet strength: 7.'5mg'$  ? ?Appropriate dose and refill sent to requested pharmacy.  ?

## 2021-07-17 ENCOUNTER — Ambulatory Visit (INDEPENDENT_AMBULATORY_CARE_PROVIDER_SITE_OTHER): Payer: Medicare HMO | Admitting: *Deleted

## 2021-07-17 DIAGNOSIS — Z951 Presence of aortocoronary bypass graft: Secondary | ICD-10-CM

## 2021-07-17 DIAGNOSIS — Z953 Presence of xenogenic heart valve: Secondary | ICD-10-CM

## 2021-07-17 DIAGNOSIS — Z5181 Encounter for therapeutic drug level monitoring: Secondary | ICD-10-CM | POA: Diagnosis not present

## 2021-07-17 DIAGNOSIS — Z8774 Personal history of (corrected) congenital malformations of heart and circulatory system: Secondary | ICD-10-CM | POA: Diagnosis not present

## 2021-07-17 LAB — POCT INR: INR: 2.1 (ref 2.0–3.0)

## 2021-07-17 NOTE — Patient Instructions (Signed)
Description   Continue taking Warfarin 1 tablet (7.'5mg'$ ) daily except 1/2 tablet (3.'75mg'$ ) on Mondays.  Recheck INR in 6 weeks. [facility] [phone] Main [phone].

## 2021-07-23 ENCOUNTER — Other Ambulatory Visit: Payer: Self-pay | Admitting: Cardiovascular Disease

## 2021-08-19 LAB — CUP PACEART REMOTE DEVICE CHECK
Battery Remaining Longevity: 109 mo
Battery Voltage: 2.99 V
Brady Statistic AP VP Percent: 5.39 %
Brady Statistic AP VS Percent: 4.92 %
Brady Statistic AS VP Percent: 12.06 %
Brady Statistic AS VS Percent: 77.62 %
Brady Statistic RA Percent Paced: 6.78 %
Brady Statistic RV Percent Paced: 17.46 %
Date Time Interrogation Session: 20230711205313
Implantable Lead Implant Date: 20200908
Implantable Lead Implant Date: 20200908
Implantable Lead Location: 753859
Implantable Lead Location: 753860
Implantable Lead Model: 3830
Implantable Lead Model: 5076
Implantable Pulse Generator Implant Date: 20200908
Lead Channel Impedance Value: 266 Ohm
Lead Channel Impedance Value: 285 Ohm
Lead Channel Impedance Value: 361 Ohm
Lead Channel Impedance Value: 418 Ohm
Lead Channel Pacing Threshold Amplitude: 0.625 V
Lead Channel Pacing Threshold Amplitude: 1.5 V
Lead Channel Pacing Threshold Pulse Width: 0.4 ms
Lead Channel Pacing Threshold Pulse Width: 0.4 ms
Lead Channel Sensing Intrinsic Amplitude: 11.875 mV
Lead Channel Sensing Intrinsic Amplitude: 11.875 mV
Lead Channel Sensing Intrinsic Amplitude: 2.625 mV
Lead Channel Sensing Intrinsic Amplitude: 2.625 mV
Lead Channel Setting Pacing Amplitude: 1.5 V
Lead Channel Setting Pacing Amplitude: 2.5 V
Lead Channel Setting Pacing Pulse Width: 0.8 ms
Lead Channel Setting Sensing Sensitivity: 1.2 mV

## 2021-08-21 ENCOUNTER — Ambulatory Visit (INDEPENDENT_AMBULATORY_CARE_PROVIDER_SITE_OTHER): Payer: Medicare HMO

## 2021-08-21 DIAGNOSIS — I442 Atrioventricular block, complete: Secondary | ICD-10-CM | POA: Diagnosis not present

## 2021-08-28 ENCOUNTER — Ambulatory Visit (INDEPENDENT_AMBULATORY_CARE_PROVIDER_SITE_OTHER): Payer: Medicare HMO | Admitting: *Deleted

## 2021-08-28 DIAGNOSIS — Z8774 Personal history of (corrected) congenital malformations of heart and circulatory system: Secondary | ICD-10-CM | POA: Diagnosis not present

## 2021-08-28 DIAGNOSIS — Z953 Presence of xenogenic heart valve: Secondary | ICD-10-CM | POA: Diagnosis not present

## 2021-08-28 DIAGNOSIS — Z951 Presence of aortocoronary bypass graft: Secondary | ICD-10-CM | POA: Diagnosis not present

## 2021-08-28 DIAGNOSIS — Z5181 Encounter for therapeutic drug level monitoring: Secondary | ICD-10-CM | POA: Diagnosis not present

## 2021-08-28 LAB — POCT INR: INR: 2.1 (ref 2.0–3.0)

## 2021-08-28 NOTE — Patient Instructions (Addendum)
Description   Continue taking Warfarin 1 tablet (7.'5mg'$ ) daily except 1/2 tablet (3.'75mg'$ ) on Mondays.  Recheck INR in 6 weeks. [facility] [phone] or [phone] Main [phone].

## 2021-09-01 NOTE — Progress Notes (Unsigned)
Cardiology Office Note Date:  09/02/2021  Patient ID:  Michael, [name] [birth year], MRN [id number] PCP:  Denita Lung, MD  Cardiologist:  Dr. Angelena Form Electrophysiologist: Dr. Rayann Heman    Chief Complaint: over due device visit  History of Present Illness: [name] is a 71 y.o. male with history of CAD/VHD/PFO (CABG 2020, bioprosthetic AVR, bioprosthetic MVR, PFO closure, LAA clipping 10/2018), CHB w/PPM, AFlutter  He comes in today to be seen for Dr. Rayann Heman, last seen by him 01/18/2019, was doing well, pacer functioning normally, planned for EP visit in a year  He saw Dr. Angelena Form last June 2022, near retirement, feeling well, no changes were made  TODAY He has retired now about a year, doing well Denies any CP, palpitations or cardiac awareness Staying busy with chores around the house. No SOB No near syncope or syncope.  No bleeding, signs of bleeding  Device information MDT dual chamber PPM implanted 10/17/2018   Past Medical History:  Diagnosis Date   BCE (basal cell epithelioma), arm    RIGHT SHOULDER   Bradycardia    Bumps on skin    on left side of nose for last 3 weeks   CAD (coronary artery disease)    a. s/p CABGx2 10/11/18 LIMA to LAD, SVG to RCA, EVH via right thigh.   Dyslipidemia    Ectatic thoracic aorta (HCC)    Esophageal stricture    GERD (gastroesophageal reflux disease)    Heart murmur    sees dr Clydene Fake   Hyperlipidemia    Incidental pulmonary nodule, > 28m and < 885m8/26/2020   Noted on CTA   Inguinal hernia    bilateral   Obesity    Presence of permanent cardiac pacemaker    Prostate cancer ([facility]   S/P aortic valve replacement with bioprosthetic valve 10/11/2018   23 mm Edwards Inspiris Resilia stented bovine pericardial tissue valve, miltral valve done also   S/P CABG x 2 10/11/2018   LIMA to LAD, SVG to RCA, EVH via right thigh   S/P mitral valve replacement with bioprosthetic valve 10/11/2018   27 mm Medtronic Mosaic stented  porcine bioprosthetic tissue valve   S/P patent foramen ovale closure 10/11/2018   S/P placement of cardiac pacemaker    Smoker     Past Surgical History:  Procedure Laterality Date   AORTIC VALVE REPLACEMENT N/A 10/11/2018   Procedure: AORTIC VALVE REPLACEMENT (AVR) with 23 Inspiris Bioprosthetic Aortic valve.;  Surgeon: OwRexene AlbertsMD;  [facility] [facility]: [facility];  Laterality: N/A;   CLIPPING OF ATRIAL APPENDAGE N/A 10/11/2018   Procedure: CLIPPING OF LEFT ATRIAL APPENDAGE with 45 AtriCure Clip.;  Surgeon: OwRexene AlbertsMD;  [facility]: [facility];  Laterality: N/A;   COLONOSCOPY  03/2008,2004   Dr. HaAmedeo Plenty CORONARY ARTERY BYPASS GRAFT N/A 10/11/2018   Procedure: CORONARY ARTERY BYPASS GRAFTING (CABG) x 2 using LIMA to the LAD and endoscopic greater saphenous vein harvesting to the RCA.;  Surgeon: OwRexene AlbertsMD;  [place] [facility]: [facility];  Laterality: N/A;   CYSTOSCOPY  04/04/2019   Procedure: CYSTOSCOPY;  Surgeon: WiCeasar MonsMD;  [facility];;   HERNIA REPAIR  12/11/10   BIH   MITRAL VALVE REPLACEMENT N/A 10/11/2018   Procedure: MITRAL VALVE (MV) REPLACEMENT with 27 Mosaic Bioprosthetic Mitral valve.;  Surgeon: OwRexene AlbertsMD;  Location:  [place] [facility];  Service: Open Heart Surgery;  Laterality: N/A;   PACEMAKER IMPLANT N/A 10/17/2018   Procedure: PACEMAKER IMPLANT;  Surgeon: Thompson Grayer, MD;  Location: [place] [facility];  Service: Cardiovascular;  Laterality: N/A;   RADIOACTIVE SEED IMPLANT N/A 04/04/2019   Procedure: RADIOACTIVE SEED IMPLANT/BRACHYTHERAPY IMPLANT, cystoscopy;  Surgeon: Ceasar Mons, MD;  Location: [facility];  Service: Urology;  Laterality: N/A;  58 seeds   REPAIR OF PATENT FORAMEN OVALE N/A 10/11/2018   Procedure: CLOSURE OF PATENT FORAMEN OVALE;  Surgeon: Rexene Alberts, MD;  Location: [place];  Service: Open Heart  Surgery;  Laterality: N/A;   RIGHT/LEFT HEART CATH AND CORONARY ANGIOGRAPHY N/A 09/15/2018   Procedure: RIGHT/LEFT HEART CATH AND CORONARY ANGIOGRAPHY;  Surgeon: Burnell Blanks, MD;  Location: [place] [facility];  Service: Cardiovascular;  Laterality: N/A;   TEE WITHOUT CARDIOVERSION N/A 12/12/2015   Procedure: TRANSESOPHAGEAL ECHOCARDIOGRAM (TEE);  Surgeon: Sanda Klein, MD;  Location[facility] ENDOSCOPY;  Service: Cardiovascular;  Laterality: N/A;   TEE WITHOUT CARDIOVERSION N/A 10/11/2018   Procedure: TRANSESOPHAGEAL ECHOCARDIOGRAM (TEE);  Surgeon: Rexene Alberts, MD;  Location: [place];  Service: Open Heart Surgery;  Laterality: N/A;   THYMECTOMY  1976   radaiation tx done    Current Outpatient Medications  Medication Sig Dispense Refill   amoxicillin (AMOXIL) 500 MG capsule TAKE 4 CAPSULES BY MOUTH ONE HOUR PRIOR TO PROCEDURE 12 capsule 0   atorvastatin (LIPITOR) 20 MG tablet TAKE 1 TABLET BY MOUTH DAILY AT 6PM 90 tablet 3   calcium carbonate (TUMS - DOSED IN MG ELEMENTAL CALCIUM) 500 MG chewable tablet Chew 1 tablet by mouth as needed for indigestion or heartburn.     metoprolol tartrate (LOPRESSOR) 25 MG tablet TAKE 1 TABLET BY MOUTH 2 TIMES A DAY 60 tablet 0   oxymetazoline (AFRIN) 0.05 % nasal spray Place 1 spray into both nostrils at bedtime as needed for congestion.     warfarin (COUMADIN) 7.5 MG tablet TAKE 1 TABLET BY MOUTH EVERY DAY 35 tablet 2   No current facility-administered medications for this visit.    Allergies:   Tylenol [acetaminophen]   Social History:  The patient  reports that he quit smoking about 2 years ago. His smoking use included cigarettes. He has a 8.75 pack-year smoking history. He has never used smokeless tobacco. He reports current alcohol use of about 1.0 standard drink of alcohol per week. He reports that he does not use drugs.   Family History:  The patient's family history includes Colon cancer in his mother; Lung cancer in his father.  ROS:   Please see the history of present illness.    All other systems are reviewed and otherwise negative.   PHYSICAL EXAM:  VS:  BP 110/78   Pulse 91   Ht 5' 7.5" (1.715 m)   Wt 247 lb 9.6 oz (112.3 kg)   SpO2 96%   BMI 38.21 kg/m  BMI: Body mass index is 38.21 kg/m. Well nourished, well developed, in no acute distress HEENT: normocephalic, atraumatic Neck: no JVD, carotid bruits or masses Cardiac:  RRR; soft SM, no rubs, or gallops Lungs:  CTA b/l, no wheezing, rhonchi or rales Abd: soft, nontender MS: no deformity or atrophy Ext: trace is any edema Skin: warm and dry, no rash Neuro:  No gross deficits appreciated Psych: euthymic mood, full affect  PPM site is stable, no tethering or discomfort   EKG:  Done today and reviewed by myself shows  SR/VP 91bpm  Device interrogation done today and reviewed by myself:  Battery and lead measurements are good Rare NSVT One 4 second AFlutter  Echo 06/13/19:  1. Left ventricular ejection fraction, by estimation, is 60 to 65%. The  left ventricle has normal function. The left ventricle has no regional  wall motion abnormalities. There is mild concentric left ventricular  hypertrophy. Left ventricular diastolic  function could not be evaluated.   2. Right ventricular systolic function is moderately reduced. The right  ventricular size is moderately enlarged. There is normal pulmonary artery  systolic pressure.   3. Left atrial size was mildly dilated.   4. Right atrial size was mildly dilated.   5. The mitral valve has been repaired/replaced. No evidence of mitral  valve regurgitation. There is a 27 mm Medtronic bioprosthetic valve  present in the mitral position. Procedure Date: 10/11/2018. Echo findings  are consistent with normal structure and  function of the mitral valve prosthesis.   6. The tricuspid valve is abnormal. Tricuspid valve regurgitation is  moderate.   7. The aortic valve has been repaired/replaced. Aortic valve   regurgitation is not visualized. There is a 23 mm Edwards bovine valve  present in the aortic position. Procedure Date: 10/11/2018. Echo findings  are consistent with normal structure and function   of the aortic valve prosthesis.   8. The inferior vena cava is normal in size with <50% respiratory  variability, suggesting right atrial pressure of 8 mmHg.    09/15/2018: LHC Prox RCA lesion is 100% stenosed. Prox Cx to Mid Cx lesion is 30% stenosed. 1st Mrg lesion is 30% stenosed. 3rd Mrg lesion is 30% stenosed. Prox LAD lesion is 40% stenosed. Mid LAD lesion is 95% stenosed.   1. Severe stenosis mid LAD 2. Mild non-obstructive disease in the LAD 3. Chronic occlusion of the proximal RCA. Filling of the proximal, mid and distal RCA from left to right collaterals 4. Severe aortic stenosis (mean gradient 34.1 mmHg, peak to peak gradient 32 mmHg, AVA 1.1 cm2) 5. Severe mitral stenosis by echo.     Recent Labs: 05/05/2021: ALT 20; BUN 15; Creatinine, Ser 1.03; Hemoglobin 15.2; Platelets 189; Potassium 4.3; Sodium 143  05/05/2021: Chol/HDL Ratio 2.2; Cholesterol, Total 128; HDL 58; LDL Chol Calc (NIH) 52; Triglycerides 93   CrCl cannot be calculated (Patient's most recent lab result is older than the maximum 21 days allowed.).   Wt Readings from Last 3 Encounters:  09/02/21 247 lb 9.6 oz (112.3 kg)  05/05/21 239 lb (108.4 kg)  08/07/20 245 lb 9.6 oz (111.4 kg)     Other studies reviewed: Additional studies/records reviewed today include: summarized above  ASSESSMENT AND PLAN:  PPM Intact function No programming changes made  CAD VHD Well functioning valves by his last echo 2021 C/w Dr. Angelena Form On statin, BB, no ASA w/wqarfarin  HTN Looks good  AFlutter CHA2DS2Vasc is 3, on warfarin <1% burden   Disposition: F/u with remotes as usual, in clinic with EP in a year, sooner if needed, will plan to transition to Dr. Quentin Ore.  Current medicines are reviewed at length with  the patient today.  The patient did not have any concerns regarding medicines.  Venetia Night, PA-C 09/02/2021 12:57 PM     [place] [address] [phone] (office)  [phone] (fax)

## 2021-09-02 ENCOUNTER — Encounter: Payer: Self-pay | Admitting: Physician Assistant

## 2021-09-02 ENCOUNTER — Ambulatory Visit (INDEPENDENT_AMBULATORY_CARE_PROVIDER_SITE_OTHER): Payer: Medicare HMO | Admitting: Physician Assistant

## 2021-09-02 VITALS — BP 110/78 | HR 91 | Ht 67.5 in | Wt 247.6 lb

## 2021-09-02 DIAGNOSIS — I442 Atrioventricular block, complete: Secondary | ICD-10-CM

## 2021-09-02 DIAGNOSIS — I4892 Unspecified atrial flutter: Secondary | ICD-10-CM | POA: Insufficient documentation

## 2021-09-02 DIAGNOSIS — Z952 Presence of prosthetic heart valve: Secondary | ICD-10-CM | POA: Diagnosis not present

## 2021-09-02 DIAGNOSIS — I2510 Atherosclerotic heart disease of native coronary artery without angina pectoris: Secondary | ICD-10-CM | POA: Diagnosis not present

## 2021-09-02 DIAGNOSIS — Z950 Presence of cardiac pacemaker: Secondary | ICD-10-CM | POA: Diagnosis not present

## 2021-09-02 LAB — CUP PACEART INCLINIC DEVICE CHECK
Battery Remaining Longevity: 111 mo
Battery Voltage: 2.99 V
Brady Statistic AP VP Percent: 1.24 %
Brady Statistic AP VS Percent: 1.27 %
Brady Statistic AS VP Percent: 14.83 %
Brady Statistic AS VS Percent: 82.65 %
Brady Statistic RA Percent Paced: 1.77 %
Brady Statistic RV Percent Paced: 16.08 %
Date Time Interrogation Session: 20230726183803
Implantable Lead Implant Date: 20200908
Implantable Lead Implant Date: 20200908
Implantable Lead Location: 753859
Implantable Lead Location: 753860
Implantable Lead Model: 3830
Implantable Lead Model: 5076
Implantable Pulse Generator Implant Date: 20200908
Lead Channel Impedance Value: 304 Ohm
Lead Channel Impedance Value: 323 Ohm
Lead Channel Impedance Value: 361 Ohm
Lead Channel Impedance Value: 475 Ohm
Lead Channel Pacing Threshold Amplitude: 0.5 V
Lead Channel Pacing Threshold Amplitude: 1.375 V
Lead Channel Pacing Threshold Pulse Width: 0.4 ms
Lead Channel Pacing Threshold Pulse Width: 0.4 ms
Lead Channel Sensing Intrinsic Amplitude: 10.875 mV
Lead Channel Sensing Intrinsic Amplitude: 11.5 mV
Lead Channel Sensing Intrinsic Amplitude: 2.625 mV
Lead Channel Sensing Intrinsic Amplitude: 2.75 mV
Lead Channel Setting Pacing Amplitude: 1.5 V
Lead Channel Setting Pacing Amplitude: 2.5 V
Lead Channel Setting Pacing Pulse Width: 0.8 ms
Lead Channel Setting Sensing Sensitivity: 1.2 mV

## 2021-09-02 NOTE — Patient Instructions (Addendum)
Medication Instructions:  Your physician recommends that you continue on your current medications as directed. Please refer to the Current Medication list given to you today.  Labwork: None ordered.  Testing/Procedures: None ordered.  Follow-Up: Your physician wants you to follow-up in:   Please schedule annual visit with Dr. Julianne Handler within the next MONTH. ( Patient is overdue. )  Please schedule 1 year NEW PATIENT follow up appointment with Dr. Grayce Sessions.     Remote monitoring is used to monitor your Pacemaker from home. This monitoring reduces the number of office visits required to check your device to one time per year. It allows [place] to keep an eye on the functioning of your device to ensure it is working properly. You are scheduled for a device check from home on 11/20/21. You may send your transmission at any time that day. If you have a wireless device, the transmission will be sent automatically. After your physician reviews your transmission, you will receive a postcard with your next transmission date.  Any Other Special Instructions Will Be Listed Below (If Applicable).  If you need a refill on your cardiac medications before your next appointment, please call your pharmacy.   Important Information About Sugar

## 2021-09-03 ENCOUNTER — Other Ambulatory Visit: Payer: Self-pay | Admitting: Cardiovascular Disease

## 2021-09-03 DIAGNOSIS — Z953 Presence of xenogenic heart valve: Secondary | ICD-10-CM

## 2021-09-03 MED ORDER — AMOXICILLIN 500 MG PO CAPS: ORAL_CAPSULE | ORAL | 0 refills | Status: DC

## 2021-09-03 NOTE — Progress Notes (Signed)
Remote pacemaker transmission.   

## 2021-10-09 ENCOUNTER — Ambulatory Visit: Payer: Medicare HMO | Attending: Cardiology

## 2021-10-09 DIAGNOSIS — Z5181 Encounter for therapeutic drug level monitoring: Secondary | ICD-10-CM

## 2021-10-09 DIAGNOSIS — Z8774 Personal history of (corrected) congenital malformations of heart and circulatory system: Secondary | ICD-10-CM | POA: Diagnosis not present

## 2021-10-09 DIAGNOSIS — Z951 Presence of aortocoronary bypass graft: Secondary | ICD-10-CM

## 2021-10-09 DIAGNOSIS — Z953 Presence of xenogenic heart valve: Secondary | ICD-10-CM

## 2021-10-09 LAB — POCT INR: INR: 2.6 (ref 2.0–3.0)

## 2021-10-09 NOTE — Patient Instructions (Signed)
Continue taking Warfarin 1 tablet (7.'5mg'$ ) daily except 1/2 tablet (3.'75mg'$ ) on Mondays.  Recheck INR in 6 weeks. Coumadin Clinic [phone] or [phone] Main [phone].

## 2021-10-12 NOTE — Progress Notes (Signed)
Chief Complaint  Patient presents with   Follow-up    CAD   History of Present Illness: 71 yo male with history of CAD s/p 2V CABG in 2020, aortic stenosis s/p AVR in 2020, mitral stenosis s/p MVR in 2020, symptomatic bradycardia s/p pacemaker placement, atrial flutter, tobacco abuse, hyperlipidemia, prostate cancer and esophageal stricture here today for cardiac follow up. He was followed for years for aortic stenosis and mitral stenosis. His valve disease progressed in 2020.Marland Kitchen Echo May 2020 with with normal LV size and function, LVEF=65%. Moderate LVH. Severe aortic stenosis and severe MS. Cardiac cath August 2020 with chronically occluded proximal RCA and severe stenosis in the mid LAD. He underwent 2V CABG (LIMA to LAD, SVG to RCA), bioprosthetic AVR and bioprosthetic MVR, left atrial appendage clipping and PFO closure in September 2020. His post-operative course was complicated by bradycardia and atrial flutter requiring pacemaker placement. He was discharged on coumadin. He remained in atrial flutter for the next month but then converted to sinus. He has been diagnosed with prostate cancer and has completed treatment. Echo May 2021 with LVEF=60-65%, mild LVH. Normally functioning aortic vale replacement and normally functioning mitral valve replacement.   He is here today for follow up. The patient denies any chest pain, dyspnea, palpitations, lower extremity edema, orthopnea, PND, dizziness, near syncope or syncope.    Primary Care Physician: Denita Lung, MD  Past Medical History:  Diagnosis Date   BCE (basal cell epithelioma), arm    RIGHT SHOULDER   Bradycardia    Bumps on skin    on left side of nose for last 3 weeks   CAD (coronary artery disease)    a. s/p CABGx2 10/11/18 LIMA to LAD, SVG to RCA, EVH via right thigh.   Dyslipidemia    Ectatic thoracic aorta (HCC)    Esophageal stricture    GERD (gastroesophageal reflux disease)    Heart murmur    sees dr Clydene Fake    Hyperlipidemia    Incidental pulmonary nodule, > 12m and < 831m8/26/2020   Noted on CTA   Inguinal hernia    bilateral   Obesity    Presence of permanent cardiac pacemaker    Prostate cancer ([facility]   S/P aortic valve replacement with bioprosthetic valve 10/11/2018   23 mm Edwards Inspiris Resilia stented bovine pericardial tissue valve, miltral valve done also   S/P CABG x 2 10/11/2018   LIMA to LAD, SVG to RCA, EVH via right thigh   S/P mitral valve replacement with bioprosthetic valve 10/11/2018   27 mm Medtronic Mosaic stented porcine bioprosthetic tissue valve   S/P patent foramen ovale closure 10/11/2018   S/P placement of cardiac pacemaker    Smoker     Past Surgical History:  Procedure Laterality Date   AORTIC VALVE REPLACEMENT N/A 10/11/2018   Procedure: AORTIC VALVE REPLACEMENT (AVR) with 23 Inspiris Bioprosthetic Aortic valve.;  Surgeon: OwRexene AlbertsMD;  [facility] [facility] Service: [facility];  Laterality: N/A;   CLIPPING OF ATRIAL APPENDAGE N/A 10/11/2018   Procedure: CLIPPING OF LEFT ATRIAL APPENDAGE with 45 AtriCure Clip.;  Surgeon: OwRexene AlbertsMD;  [place] [facility] Service: Open [facility] Surgery;  Laterality: N/A;   COLONOSCOPY  03/2008,2004   Dr. HaAmedeo Plenty CORONARY ARTERY BYPASS GRAFT N/A 10/11/2018   Procedure: CORONARY ARTERY BYPASS GRAFTING (CABG) x 2 using LIMA to the LAD and endoscopic greater saphenous vein harvesting to the RCA.;  Surgeon: OwRoxy Manns  Valentina Gu, MD;  Location: [place];  Service: Open Heart Surgery;  Laterality: N/A;   CYSTOSCOPY  04/04/2019   Procedure: CYSTOSCOPY;  Surgeon: Ceasar Mons, MD;  Location: [facility];  Service: Urology;;   HERNIA REPAIR  12/11/10   BIH   MITRAL VALVE REPLACEMENT N/A 10/11/2018   Procedure: MITRAL VALVE (MV) REPLACEMENT with 27 Mosaic Bioprosthetic Mitral valve.;  Surgeon: Rexene Alberts, MD;  Location: [place];  Service: Open Heart Surgery;  Laterality: N/A;   PACEMAKER IMPLANT N/A 10/17/2018    Procedure: PACEMAKER IMPLANT;  Surgeon: Thompson Grayer, MD;  Location: [place] [facility];  Service: Cardiovascular;  Laterality: N/A;   RADIOACTIVE SEED IMPLANT N/A 04/04/2019   Procedure: RADIOACTIVE SEED IMPLANT/BRACHYTHERAPY IMPLANT, cystoscopy;  Surgeon: Ceasar Mons, MD;  Location: [facility];  Service: Urology;  Laterality: N/A;  58 seeds   REPAIR OF PATENT FORAMEN OVALE N/A 10/11/2018   Procedure: CLOSURE OF PATENT FORAMEN OVALE;  Surgeon: Rexene Alberts, MD;  Location: [place];  Service: Open Heart Surgery;  Laterality: N/A;   RIGHT/LEFT HEART CATH AND CORONARY ANGIOGRAPHY N/A 09/15/2018   Procedure: RIGHT/LEFT HEART CATH AND CORONARY ANGIOGRAPHY;  Surgeon: Burnell Blanks, MD;  Location: [place] [facility] LAB;  Service: Cardiovascular;  Laterality: N/A;   TEE WITHOUT CARDIOVERSION N/A 12/12/2015   Procedure: TRANSESOPHAGEAL ECHOCARDIOGRAM (TEE);  Surgeon: Sanda Klein, MD;  Location: [facility] ENDOSCOPY;  Service: Cardiovascular;  Laterality: N/A;   TEE WITHOUT CARDIOVERSION N/A 10/11/2018   Procedure: TRANSESOPHAGEAL ECHOCARDIOGRAM (TEE);  Surgeon: Rexene Alberts, MD;  Location: [place];  Service: Open Heart Surgery;  Laterality: N/A;   THYMECTOMY  1976   radaiation tx done    Current Outpatient Medications  Medication Sig Dispense Refill   amoxicillin (AMOXIL) 500 MG capsule Take 4 capsules by mouth one hour prior to procedure. 12 capsule 0   atorvastatin (LIPITOR) 20 MG tablet TAKE 1 TABLET BY MOUTH DAILY AT 6PM 90 tablet 3   calcium carbonate (TUMS - DOSED IN MG ELEMENTAL CALCIUM) 500 MG chewable tablet Chew 1 tablet by mouth as needed for indigestion or heartburn.     metoprolol tartrate (LOPRESSOR) 25 MG tablet TAKE 1 TABLET BY MOUTH 2 TIMES A DAY 60 tablet 0   oxymetazoline (AFRIN) 0.05 % nasal spray Place 1 spray into both nostrils at bedtime as needed for congestion.     warfarin (COUMADIN) 7.5 MG tablet TAKE 1 TABLET BY MOUTH EVERY DAY 35 tablet 2    No current facility-administered medications for this visit.    Allergies  Allergen Reactions   Tylenol [Acetaminophen] Nausea Only    Social History   Socioeconomic History   Marital status: Married    Spouse name: Not on file   Number of children: 0   Years of education: Not on file   Highest education level: Not on file  Occupational History   Occupation: Geophysicist/field seismologist    Employer: [employer]  Tobacco Use   Smoking status: Former    Packs/day: 0.25    Years: 35.00    Total pack years: 8.75    Types: Cigarettes    Quit date: 09/04/2018    Years since quitting: 3.1   Smokeless tobacco: Never  Vaping Use   Vaping Use: Never used  Substance and Sexual Activity   Alcohol use: Yes    Alcohol/week: 1.0 standard drink of alcohol    Types: 1 Cans of beer per week    Comment: rare  Drug use: No   Sexual activity: Yes  Other Topics Concern   Not on file  Social History Narrative   Not on file   Social Determinants of Health   Financial Resource Strain: Not on file  Food Insecurity: Not on file  Transportation Needs: Not on file  Physical Activity: Not on file  Stress: Not on file  Social Connections: Not on file  Intimate Partner Violence: Not on file    Family History  Problem Relation Age of Onset   Colon cancer Mother    Lung cancer Father        smoker   CAD Neg Hx    Pancreatic cancer Neg Hx    Breast cancer Neg Hx     Review of Systems:  As stated in the HPI and otherwise negative.   BP 112/80   Pulse 96   Ht 5' 7.5" (1.715 m)   Wt 246 lb (111.6 kg)   SpO2 95%   BMI 37.96 kg/m   Physical Examination: General: Well developed, well nourished, NAD  HEENT: OP clear, mucus membranes moist  SKIN: warm, dry. No rashes. Neuro: No focal deficits  Musculoskeletal: Muscle strength 5/5 all ext  Psychiatric: Mood and affect normal  Neck: No JVD, no carotid bruits, no thyromegaly, no lymphadenopathy.  Lungs:Clear bilaterally, no wheezes, rhonci,  crackles Cardiovascular: Regular rate and rhythm. No murmurs, gallops or rubs. Abdomen:Soft. Bowel sounds present. Non-tender.  Extremities: No lower extremity edema. Pulses are 2 + in the bilateral DP/PT.  Echo 06/13/19:  1. Left ventricular ejection fraction, by estimation, is 60 to 65%. The  left ventricle has normal function. The left ventricle has no regional  wall motion abnormalities. There is mild concentric left ventricular  hypertrophy. Left ventricular diastolic  function could not be evaluated.   2. Right ventricular systolic function is moderately reduced. The right  ventricular size is moderately enlarged. There is normal pulmonary artery  systolic pressure.   3. Left atrial size was mildly dilated.   4. Right atrial size was mildly dilated.   5. The mitral valve has been repaired/replaced. No evidence of mitral  valve regurgitation. There is a 27 mm Medtronic bioprosthetic valve  present in the mitral position. Procedure Date: 10/11/2018. Echo findings  are consistent with normal structure and  function of the mitral valve prosthesis.   6. The tricuspid valve is abnormal. Tricuspid valve regurgitation is  moderate.   7. The aortic valve has been repaired/replaced. Aortic valve  regurgitation is not visualized. There is a 23 mm Edwards bovine valve  present in the aortic position. Procedure Date: 10/11/2018. Echo findings  are consistent with normal structure and function   of the aortic valve prosthesis.   8. The inferior vena cava is normal in size with <50% respiratory  variability, suggesting right atrial pressure of 8 mmHg.   EKG:  EKG is not ordered today. The ekg ordered today demonstrates.   Recent Labs: 05/05/2021: ALT 20; BUN 15; Creatinine, Ser 1.03; Hemoglobin 15.2; Platelets 189; Potassium 4.3; Sodium 143   Lipid Panel    Component Value Date/Time   CHOL 128 05/05/2021 1106   TRIG 93 05/05/2021 1106   HDL 58 05/05/2021 1106   CHOLHDL 2.2 05/05/2021 1106    CHOLHDL 4.2 03/25/2015 0001   VLDL 30 03/25/2015 0001   LDLCALC 52 05/05/2021 1106     Wt Readings from Last 3 Encounters:  10/13/21 246 lb (111.6 kg)  09/02/21 247 lb 9.6 oz (112.3 kg)  05/05/21 239 lb (108.4 kg)     Other studies Reviewed: Additional studies/ records that were reviewed today include: . Review of the above records demonstrates:   Assessment and Plan:   1. Severe Aortic stenosis/moderate aortic insufficiency: He is now s/p placement of a bioprosthetic AVR in September 2020.  Echo May 2021 with normally functioning valve. SBE prophylaxis as needed.    2. Mitral valve stenosis: He is s/p bioprosthetic MVR in September 2020.  Normally functioning MVR by echo in May 2021.   3. Complete heart block: Pacemaker in place. Followed in EP clinic   4. Atrial flutter, paroxysmal: He is in sinus today. CHADS VASC score 3. Continue Lopressor and coumadin.   5. CAD s/p CABG: No chest pain. Continue statin and beta blocker.   Current medicines are reviewed at length with the patient today.  The patient does not have concerns regarding medicines.  The following changes have been made:  no change  Labs/ tests ordered today include:   No orders of the defined types were placed in this encounter.  Disposition:   F/U with me in 12 months  Signed, Lauree Chandler, MD 10/13/2021 10:53 AM    [facility] [place], [place], [place]  [address] Phone: [phone]; Fax: [phone]

## 2021-10-13 ENCOUNTER — Encounter: Payer: Self-pay | Admitting: Cardiovascular Disease

## 2021-10-13 ENCOUNTER — Ambulatory Visit: Payer: Medicare HMO | Attending: Cardiovascular Disease | Admitting: Cardiovascular Disease

## 2021-10-13 VITALS — BP 112/80 | HR 96 | Ht 67.5 in | Wt 246.0 lb

## 2021-10-13 DIAGNOSIS — I442 Atrioventricular block, complete: Secondary | ICD-10-CM | POA: Diagnosis not present

## 2021-10-13 DIAGNOSIS — I2510 Atherosclerotic heart disease of native coronary artery without angina pectoris: Secondary | ICD-10-CM | POA: Diagnosis not present

## 2021-10-13 DIAGNOSIS — Z953 Presence of xenogenic heart valve: Secondary | ICD-10-CM | POA: Diagnosis not present

## 2021-10-13 NOTE — Patient Instructions (Signed)
Medication Instructions:  Your physician recommends that you continue on your current medications as directed. Please refer to the Current Medication list given to you today.  *If you need a refill on your cardiac medications before your next appointment, please call your pharmacy*   Lab Work: none If you have labs (blood work) drawn today and your tests are completely normal, you will receive your results only by: [place] (if you have MyChart) OR A paper copy in the mail If you have any lab test that is abnormal or we need to change your treatment, we will call you to review the results.   Testing/Procedures: none   Follow-Up: At [facility], you and your health needs are our priority.  As part of our continuing mission to provide you with exceptional heart care, we have created designated Provider Care Teams.  These Care Teams include your primary Cardiologist (physician) and Advanced Practice Providers (APPs -  Physician Assistants and Nurse Practitioners) who all work together to provide you with the care you need, when you need it.  We recommend signing up for the patient portal called "MyChart".  Sign up information is provided on this After Visit Summary.  MyChart is used to connect with patients for Virtual Visits (Telemedicine).  Patients are able to view lab/test results, encounter notes, upcoming appointments, etc.  Non-urgent messages can be sent to your provider as well.   To learn more about what you can do with MyChart, go to [email or web address].    Your next appointment:   12 month(s)  The format for your next appointment:   In Person  Provider:   Lauree Chandler, MD     Other Instructions    Important Information About Sugar

## 2021-10-14 ENCOUNTER — Encounter: Payer: Self-pay | Admitting: Internal Medicine

## 2021-10-21 ENCOUNTER — Other Ambulatory Visit: Payer: Self-pay | Admitting: Cardiovascular Disease

## 2021-11-17 ENCOUNTER — Encounter: Payer: Self-pay | Admitting: Internal Medicine

## 2021-11-17 LAB — CUP PACEART REMOTE DEVICE CHECK
Battery Remaining Longevity: 104 mo
Battery Voltage: 3 V
Brady Statistic AP VP Percent: 2.91 %
Brady Statistic AP VS Percent: 2.64 %
Brady Statistic AS VP Percent: 84.5 %
Brady Statistic AS VS Percent: 9.95 %
Brady Statistic RA Percent Paced: 3.72 %
Brady Statistic RV Percent Paced: 87.41 %
Date Time Interrogation Session: 20231009210034
Implantable Lead Implant Date: 20200908
Implantable Lead Implant Date: 20200908
Implantable Lead Location: 753859
Implantable Lead Location: 753860
Implantable Lead Model: 3830
Implantable Lead Model: 5076
Implantable Pulse Generator Implant Date: 20200908
Lead Channel Impedance Value: 266 Ohm
Lead Channel Impedance Value: 304 Ohm
Lead Channel Impedance Value: 323 Ohm
Lead Channel Impedance Value: 437 Ohm
Lead Channel Pacing Threshold Amplitude: 0.5 V
Lead Channel Pacing Threshold Amplitude: 1.375 V
Lead Channel Pacing Threshold Pulse Width: 0.4 ms
Lead Channel Pacing Threshold Pulse Width: 0.4 ms
Lead Channel Sensing Intrinsic Amplitude: 1.625 mV
Lead Channel Sensing Intrinsic Amplitude: 1.625 mV
Lead Channel Sensing Intrinsic Amplitude: 8.75 mV
Lead Channel Sensing Intrinsic Amplitude: 8.75 mV
Lead Channel Setting Pacing Amplitude: 1.5 V
Lead Channel Setting Pacing Amplitude: 2.5 V
Lead Channel Setting Pacing Pulse Width: 0.8 ms
Lead Channel Setting Sensing Sensitivity: 1.2 mV

## 2021-11-20 ENCOUNTER — Ambulatory Visit (INDEPENDENT_AMBULATORY_CARE_PROVIDER_SITE_OTHER): Payer: Medicare HMO

## 2021-11-20 ENCOUNTER — Ambulatory Visit: Payer: Medicare HMO | Attending: Cardiovascular Disease

## 2021-11-20 DIAGNOSIS — Z953 Presence of xenogenic heart valve: Secondary | ICD-10-CM

## 2021-11-20 DIAGNOSIS — Z951 Presence of aortocoronary bypass graft: Secondary | ICD-10-CM | POA: Diagnosis not present

## 2021-11-20 DIAGNOSIS — Z5181 Encounter for therapeutic drug level monitoring: Secondary | ICD-10-CM | POA: Diagnosis not present

## 2021-11-20 DIAGNOSIS — Z8774 Personal history of (corrected) congenital malformations of heart and circulatory system: Secondary | ICD-10-CM

## 2021-11-20 DIAGNOSIS — I442 Atrioventricular block, complete: Secondary | ICD-10-CM

## 2021-11-20 LAB — POCT INR: INR: 3 (ref 2.0–3.0)

## 2021-11-20 NOTE — Patient Instructions (Signed)
Continue taking Warfarin 1 tablet (7.'5mg'$ ) daily except 1/2 tablet (3.'75mg'$ ) on Mondays.  Recheck INR in 7 weeks. [facility] [phone] or [phone] Main [phone]

## 2021-11-26 NOTE — Progress Notes (Signed)
Remote pacemaker transmission.   

## 2021-11-30 ENCOUNTER — Encounter: Payer: Self-pay | Admitting: Internal Medicine

## 2021-12-03 ENCOUNTER — Ambulatory Visit (INDEPENDENT_AMBULATORY_CARE_PROVIDER_SITE_OTHER): Payer: Medicare HMO | Admitting: Family Medicine

## 2021-12-03 ENCOUNTER — Telehealth: Payer: Self-pay | Admitting: Family Medicine

## 2021-12-03 VITALS — BP 124/78 | HR 96 | Temp 98.3°F | Wt 248.8 lb

## 2021-12-03 DIAGNOSIS — R21 Rash and other nonspecific skin eruption: Secondary | ICD-10-CM

## 2021-12-03 NOTE — Progress Notes (Signed)
   Subjective:    Patient ID: [name], male    DOB: [birth year], 71 y.o.   MRN: [id number]  HPI He comes in for evaluation of a 4-day history of the rash present on his torso and legs.  There is no itching with this, fever, chills, new medications, detergent changes, use of colognes.  No nausea vomiting abdominal pain.   Review of Systems     Objective:   Physical Exam Alert and in no distress.  Diffuse erythematous macular lesions are noted on his torso back and upper thighs.  None on his palms or soles or on his face.       Assessment & Plan:  Rash Discussed the fact that it does not seem to be associated with anything in particular and is not pruritic in nature.  Difficult to say what it is.  Discussed using Benadryl but is not causing any itch.  Recommend watchful waiting and if things change we can always reevaluate.  He was comfortable with that.

## 2021-12-03 NOTE — Telephone Encounter (Signed)
Spoke with patient made an appt rash 2 days

## 2021-12-03 NOTE — Telephone Encounter (Signed)
Pt asks if you can give him a call. He has gotten a rash and I did offer him an appointment but he has some questions first.

## 2021-12-07 ENCOUNTER — Encounter: Payer: Self-pay | Admitting: Family Medicine

## 2021-12-08 DIAGNOSIS — L299 Pruritus, unspecified: Secondary | ICD-10-CM | POA: Diagnosis not present

## 2021-12-08 DIAGNOSIS — L570 Actinic keratosis: Secondary | ICD-10-CM | POA: Diagnosis not present

## 2021-12-08 DIAGNOSIS — L300 Nummular dermatitis: Secondary | ICD-10-CM | POA: Diagnosis not present

## 2021-12-28 ENCOUNTER — Other Ambulatory Visit: Payer: Self-pay | Admitting: Cardiovascular Disease

## 2021-12-28 DIAGNOSIS — Z953 Presence of xenogenic heart valve: Secondary | ICD-10-CM

## 2021-12-28 NOTE — Telephone Encounter (Signed)
Last INR 11/20/2021 Last OV 10/13/2021

## 2022-01-08 ENCOUNTER — Ambulatory Visit: Payer: Medicare HMO | Attending: Cardiovascular Disease

## 2022-01-08 DIAGNOSIS — Z5181 Encounter for therapeutic drug level monitoring: Secondary | ICD-10-CM

## 2022-01-08 DIAGNOSIS — Z8774 Personal history of (corrected) congenital malformations of heart and circulatory system: Secondary | ICD-10-CM | POA: Diagnosis not present

## 2022-01-08 DIAGNOSIS — Z953 Presence of xenogenic heart valve: Secondary | ICD-10-CM

## 2022-01-08 DIAGNOSIS — Z951 Presence of aortocoronary bypass graft: Secondary | ICD-10-CM | POA: Diagnosis not present

## 2022-01-08 LAB — POCT INR: INR: 1.7 — AB (ref 2.0–3.0)

## 2022-01-08 NOTE — Patient Instructions (Addendum)
Description   Take 1.5 tablets today and then continue taking Warfarin 1 tablet (7.'5mg'$ ) daily except 1/2 tablet (3.'75mg'$ ) on Mondays.  Recheck INR in 6 weeks. [facility] [phone] Main [phone].

## 2022-02-03 DIAGNOSIS — L300 Nummular dermatitis: Secondary | ICD-10-CM | POA: Diagnosis not present

## 2022-02-17 LAB — CUP PACEART REMOTE DEVICE CHECK
Battery Remaining Longevity: 101 mo
Battery Voltage: 2.98 V
Brady Statistic AP VP Percent: 1.89 %
Brady Statistic AP VS Percent: 1.78 %
Brady Statistic AS VP Percent: 89.4 %
Brady Statistic AS VS Percent: 6.94 %
Brady Statistic RA Percent Paced: 2.46 %
Brady Statistic RV Percent Paced: 91.28 %
Date Time Interrogation Session: 20240107223807
Implantable Lead Connection Status: 753985
Implantable Lead Connection Status: 753985
Implantable Lead Implant Date: 20200908
Implantable Lead Implant Date: 20200908
Implantable Lead Location: 753859
Implantable Lead Location: 753860
Implantable Lead Model: 3830
Implantable Lead Model: 5076
Implantable Pulse Generator Implant Date: 20200908
Lead Channel Impedance Value: 285 Ohm
Lead Channel Impedance Value: 323 Ohm
Lead Channel Impedance Value: 361 Ohm
Lead Channel Impedance Value: 513 Ohm
Lead Channel Pacing Threshold Amplitude: 0.5 V
Lead Channel Pacing Threshold Amplitude: 1.375 V
Lead Channel Pacing Threshold Pulse Width: 0.4 ms
Lead Channel Pacing Threshold Pulse Width: 0.4 ms
Lead Channel Sensing Intrinsic Amplitude: 10.25 mV
Lead Channel Sensing Intrinsic Amplitude: 10.25 mV
Lead Channel Sensing Intrinsic Amplitude: 2.125 mV
Lead Channel Sensing Intrinsic Amplitude: 2.125 mV
Lead Channel Setting Pacing Amplitude: 1.5 V
Lead Channel Setting Pacing Amplitude: 2.5 V
Lead Channel Setting Pacing Pulse Width: 0.8 ms
Lead Channel Setting Sensing Sensitivity: 1.2 mV
Zone Setting Status: 755011
Zone Setting Status: 755011

## 2022-02-19 ENCOUNTER — Ambulatory Visit: Payer: Medicare HMO | Attending: Cardiovascular Disease

## 2022-02-19 ENCOUNTER — Ambulatory Visit (INDEPENDENT_AMBULATORY_CARE_PROVIDER_SITE_OTHER): Payer: Medicare HMO

## 2022-02-19 DIAGNOSIS — I442 Atrioventricular block, complete: Secondary | ICD-10-CM | POA: Diagnosis not present

## 2022-02-19 DIAGNOSIS — Z951 Presence of aortocoronary bypass graft: Secondary | ICD-10-CM | POA: Diagnosis not present

## 2022-02-19 DIAGNOSIS — Z953 Presence of xenogenic heart valve: Secondary | ICD-10-CM

## 2022-02-19 DIAGNOSIS — Z8774 Personal history of (corrected) congenital malformations of heart and circulatory system: Secondary | ICD-10-CM

## 2022-02-19 DIAGNOSIS — Z5181 Encounter for therapeutic drug level monitoring: Secondary | ICD-10-CM

## 2022-02-19 LAB — POCT INR: INR: 1.8 — AB (ref 2.0–3.0)

## 2022-02-19 NOTE — Patient Instructions (Signed)
Take 2 tablets today and then INCREASE TO 1 tablet (7.'5mg'$ ) daily.   Recheck INR in 3 weeks. [facility] [phone] Main [phone].

## 2022-02-26 ENCOUNTER — Other Ambulatory Visit (INDEPENDENT_AMBULATORY_CARE_PROVIDER_SITE_OTHER): Payer: Medicare HMO

## 2022-02-26 DIAGNOSIS — Z23 Encounter for immunization: Secondary | ICD-10-CM | POA: Diagnosis not present

## 2022-03-09 NOTE — Progress Notes (Signed)
Remote pacemaker transmission.   

## 2022-03-12 ENCOUNTER — Ambulatory Visit: Payer: Medicare HMO

## 2022-03-16 ENCOUNTER — Ambulatory Visit: Payer: Medicare HMO | Attending: Internal Medicine

## 2022-03-16 DIAGNOSIS — Z8774 Personal history of (corrected) congenital malformations of heart and circulatory system: Secondary | ICD-10-CM

## 2022-03-16 DIAGNOSIS — Z953 Presence of xenogenic heart valve: Secondary | ICD-10-CM

## 2022-03-16 DIAGNOSIS — Z951 Presence of aortocoronary bypass graft: Secondary | ICD-10-CM | POA: Diagnosis not present

## 2022-03-16 DIAGNOSIS — Z5181 Encounter for therapeutic drug level monitoring: Secondary | ICD-10-CM | POA: Diagnosis not present

## 2022-03-16 LAB — POCT INR: INR: 3.2 — AB (ref 2.0–3.0)

## 2022-03-16 NOTE — Patient Instructions (Signed)
CONTINUE 1 tablet (7.'5mg'$ ) daily.   Recheck INR in 4 weeks. [facility] [phone] Main [phone].

## 2022-03-24 ENCOUNTER — Encounter: Payer: Self-pay | Admitting: Family Medicine

## 2022-03-24 ENCOUNTER — Ambulatory Visit (INDEPENDENT_AMBULATORY_CARE_PROVIDER_SITE_OTHER): Payer: Medicare HMO | Admitting: Family Medicine

## 2022-03-24 VITALS — BP 88/70 | HR 105 | Temp 97.6°F | Resp 18 | Wt 243.0 lb

## 2022-03-24 DIAGNOSIS — R531 Weakness: Secondary | ICD-10-CM

## 2022-03-24 DIAGNOSIS — Z950 Presence of cardiac pacemaker: Secondary | ICD-10-CM

## 2022-03-24 DIAGNOSIS — Z953 Presence of xenogenic heart valve: Secondary | ICD-10-CM | POA: Diagnosis not present

## 2022-03-24 NOTE — Patient Instructions (Addendum)
As long as you continue to get better we have nothing to worry about but if you have more weakness dizziness blurred or double vision I want to know.  Get more physically active

## 2022-03-24 NOTE — Progress Notes (Signed)
   Subjective:    Patient ID: [name], male    DOB: 12/17/1950, 72 y.o.   MRN: [id number]  HPI He states that on February 1, later in the afternoon he had the onset of weakness, myalgias, fever, dizziness.  He did have 1 episode of double vision that lasted for several hours and then went away.  He has not had any more trouble with that since that time.  He did take it easy and limited his physical activity because of weakness until the last several days.  No chest pain, shortness of breath, DOE or PND.  He did have a negative COVID test.  He does state that he is roughly 50% better.  He does have an underlying history of ASHD with CABG.  He does follow-up regularly with cardiology.   Review of Systems     Objective:   Physical Exam Alert and in no distress. Tympanic membranes and canals are normal. Pharyngeal area is normal. Neck is supple without adenopathy or thyromegaly. Cardiac exam shows an irregular rhythm without murmurs or gallops. Lungs are clear to auscultation.        Assessment & Plan:  Weakness  S/P aortic + mitral valve replacement with bioprosthetic valves  Pacemaker Although his weakness and blood pressure is low he seems to be tolerating this with no real postural signs.  I am reluctant to change his beta-blocker.  Hopefully with time and getting up and getting more physically active his blood pressure will be more appropriate and the weakness will go away.  If he has further difficulty he will certainly let me know.

## 2022-03-29 ENCOUNTER — Telehealth: Payer: Self-pay | Admitting: Cardiovascular Disease

## 2022-03-29 NOTE — Telephone Encounter (Signed)
STAT if patient feels like he/she is going to faint   Are you dizzy now? About 10 minutes ago   Do you feel faint or have you passed out?  No   Do you have any other symptoms?  Weak   Have you checked your HR and BP (record if available)? Bp: 88/70 hr 105   Pt states he had a virus about 3 weeks ago and ever since then he has been feeling weak. He states he also had an episode of dizziness and it happened again today. He spoke with his PCP and they told him it would probably just go away but he wants to know what Dr. Angelena Form thinks

## 2022-03-29 NOTE — Telephone Encounter (Signed)
Called pt back regarding issues with dizziness. Pt states that back at the beginning of February he was sick with what he thought might be the flu. He tested negative for covid. Pt states that he was achy, weak and tired for a couple of days and had a fever for 1 day. Pt states that since then he has had 1 episode of dizziness that happened shortly after he had eaten. Pt recently saw his PCP who told him that this was probably from the virus and would eventually pass. Pt did note that at his visit his blood pressure was a bit lower than usual. Per office note 88/70, HR 105. Pt states that he was doing errands today after lunch when he had an episode of dizziness, it only lasted for a few seconds and resolved. Pt states that he drinks soda and V8 splash. Advised pt to work on getting more water in during the day. Pt denies any other symptoms including swelling of legs. Pt also states that he was doing some things at church this morning like taking out the trash, etc and didn't have any issues. Will forward to provider to advise.

## 2022-03-30 NOTE — Telephone Encounter (Signed)
Michael Blanks, MD  You18 hours ago (6:03 PM)   No changes. Would encourage good po intake and extra fluids while recovering from the viral illness. [facility] patient and informed him.  He agrees that increasing water, non caffeinated fluids is helping him.  BP is improving.  Will change positions slowly and call if needs anything further.

## 2022-04-13 ENCOUNTER — Ambulatory Visit: Payer: Medicare HMO | Attending: Cardiology | Admitting: *Deleted

## 2022-04-13 DIAGNOSIS — Z953 Presence of xenogenic heart valve: Secondary | ICD-10-CM

## 2022-04-13 DIAGNOSIS — Z8774 Personal history of (corrected) congenital malformations of heart and circulatory system: Secondary | ICD-10-CM | POA: Diagnosis not present

## 2022-04-13 DIAGNOSIS — Z951 Presence of aortocoronary bypass graft: Secondary | ICD-10-CM | POA: Diagnosis not present

## 2022-04-13 DIAGNOSIS — Z5181 Encounter for therapeutic drug level monitoring: Secondary | ICD-10-CM

## 2022-04-13 LAB — POCT INR: INR: 3.6 — AB (ref 2.0–3.0)

## 2022-04-13 NOTE — Patient Instructions (Signed)
Description   Do not take any warfarin today then START taking 1 tablet (7.'5mg'$ ) daily except 1/2 tablet on Mondays. Recheck INR in 4 weeks. [facility] [phone] Main [phone].

## 2022-04-27 ENCOUNTER — Encounter: Payer: Self-pay | Admitting: Family Medicine

## 2022-04-28 ENCOUNTER — Telehealth: Payer: Self-pay

## 2022-04-28 NOTE — Telephone Encounter (Signed)
Patient writing in to [facility] yesterday evening (04/27/22) stating he has been extremely tired for the past several weeks. He reported that he was exerting himself by walking and taking out the trash and started to feel tired. He noted his pulse rate was 67 as he was due for dose of metoprolol. He wrote in to ask if he should I go ahead and take metoprolol with low pulse rate. He wrote in 45-60 mins later stating he had a rest, felt better, noted his HR was "improved" so he took his metoprolol.  Called patient to see if he was having any symptoms today. Patient states that ever since he had viral illness in early February he has been struggling with fatigue. He states that he has been getting a little better everyday and has been slowing starting to increase his activity level. He states he knew that taking out the trash and walking would probably make him feel tired. He checked his HR after activity and noted it was 67. He noted no SOB, chest pain or dizziness, lightheadedness. He states he rested for a bit, felt less tired and checked his HR again. It was 77 so he took his evening dose of metoprolol.  This morning he denies any symptoms at all and states he feels "normal". Patient has a pacemaker and states that "every day I feel a little better since I had that viral illness in early February, I just don't understand why it is taking so long". Patient has an appt with his PCP 05/18/22. I encouraged him to talk to PCP about his fatigue and to make an appointment with [place] if his fatigue does not continue to resolve or if PCP is not able to offer lab work or other assessment.

## 2022-05-05 ENCOUNTER — Encounter: Payer: Self-pay | Admitting: Family Medicine

## 2022-05-05 ENCOUNTER — Ambulatory Visit (INDEPENDENT_AMBULATORY_CARE_PROVIDER_SITE_OTHER): Payer: Medicare HMO | Admitting: Family Medicine

## 2022-05-05 VITALS — BP 100/70 | HR 100 | Temp 97.7°F | Resp 18 | Wt 229.2 lb

## 2022-05-05 DIAGNOSIS — R630 Anorexia: Secondary | ICD-10-CM | POA: Diagnosis not present

## 2022-05-05 DIAGNOSIS — M25462 Effusion, left knee: Secondary | ICD-10-CM

## 2022-05-05 DIAGNOSIS — M25562 Pain in left knee: Secondary | ICD-10-CM | POA: Diagnosis not present

## 2022-05-05 DIAGNOSIS — R634 Abnormal weight loss: Secondary | ICD-10-CM | POA: Diagnosis not present

## 2022-05-05 MED ORDER — TRIAMCINOLONE ACETONIDE 40 MG/ML IJ SUSP
40.0000 mg | Freq: Once | INTRAMUSCULAR | Status: AC
  Administered 2022-05-05: 40 mg via INTRAMUSCULAR

## 2022-05-05 MED ORDER — LIDOCAINE HCL (PF) 1 % IJ SOLN
1.0000 mL | Freq: Once | INTRAMUSCULAR | Status: AC
  Administered 2022-05-05: 1 mL

## 2022-05-05 NOTE — Progress Notes (Signed)
   Subjective:    Patient ID: [name], male    DOB: 01/27/1951, 72 y.o.   MRN: [id number]  HPI He is here for evaluation of weight loss, fatigue, anorexia for the last several months.  No abdominal pain, vomiting.  No change in bowel habits.  His last colonoscopy was in 2015.  Was apparently negative. He also states that approximately 3 weeks ago he had some low back pain with radiation down his left leg however that his essentially gone away but now he has a 2-day history of left knee pain with swelling.  No other joints are involved.   Review of Systems     Objective:   Physical Exam Alert and in no distress. Tympanic membranes and canals are normal. Pharyngeal area is normal. Neck is supple without adenopathy or thyromegaly. Cardiac exam shows a regular sinus rhythm without murmurs or gallops. Lungs are clear to auscultation.  Abdominal exam shows active bowel sounds without masses or tenderness. Record indicates a 14 pound weight loss since mid February. Left knee exam does show an effusion.  It is not hot or tender.  Pain on motion of the knee.      Assessment & Plan:  Weight loss  Anorexia  Effusion, left knee  Acute pain of left knee With his constitutional symptoms, cancer is high on the list.  I will do the blood work and  order a CT scan of the abdomen and pelvis. The knee was prepped laterally with Betadine.  Approximately 10 cc of yellow clear fluid that was slightly blood-tinged was removed without difficulty.  He was then given 40 mg of Kenalog and 2 cc of Xylocaine.  I suspect that this is either gout or pseudogout.

## 2022-05-05 NOTE — Addendum Note (Signed)
Addended by: Randal Buba on: 05/05/2022 01:01 PM   Modules accepted: Orders

## 2022-05-06 ENCOUNTER — Encounter: Payer: Self-pay | Admitting: Family Medicine

## 2022-05-06 ENCOUNTER — Ambulatory Visit
Admission: RE | Admit: 2022-05-06 | Discharge: 2022-05-06 | Disposition: A | Discharge status: Home / Self Care | Payer: Medicare HMO | Source: Ambulatory Visit | Attending: Family Medicine | Admitting: Family Medicine

## 2022-05-06 ENCOUNTER — Telehealth: Payer: Self-pay | Admitting: Family Medicine

## 2022-05-06 DIAGNOSIS — K4020 Bilateral inguinal hernia, without obstruction or gangrene, not specified as recurrent: Secondary | ICD-10-CM | POA: Diagnosis not present

## 2022-05-06 DIAGNOSIS — Z8546 Personal history of malignant neoplasm of prostate: Secondary | ICD-10-CM | POA: Diagnosis not present

## 2022-05-06 DIAGNOSIS — K449 Diaphragmatic hernia without obstruction or gangrene: Secondary | ICD-10-CM | POA: Diagnosis not present

## 2022-05-06 DIAGNOSIS — R634 Abnormal weight loss: Secondary | ICD-10-CM | POA: Diagnosis not present

## 2022-05-06 DIAGNOSIS — R630 Anorexia: Secondary | ICD-10-CM

## 2022-05-06 MED ORDER — IOPAMIDOL (ISOVUE-370) INJECTION 76%
80.0000 mL | Freq: Once | INTRAVENOUS | Status: AC | PRN
  Administered 2022-05-06: 80 mL via INTRAVENOUS

## 2022-05-06 NOTE — Telephone Encounter (Signed)
Contacted Carolan Clines to schedule their annual wellness visit. Appointment made for 05/10/21.  [facility] [employer] [facility] direct phone # [phone]

## 2022-05-07 ENCOUNTER — Ambulatory Visit: Payer: Medicare HMO | Admitting: Family Medicine

## 2022-05-08 ENCOUNTER — Encounter: Payer: Self-pay | Admitting: Family Medicine

## 2022-05-10 ENCOUNTER — Other Ambulatory Visit: Payer: Self-pay | Admitting: *Deleted

## 2022-05-10 DIAGNOSIS — R634 Abnormal weight loss: Secondary | ICD-10-CM

## 2022-05-10 DIAGNOSIS — Z1211 Encounter for screening for malignant neoplasm of colon: Secondary | ICD-10-CM

## 2022-05-10 LAB — CBC WITH DIFFERENTIAL/PLATELET
Basophils Absolute: 0 10*3/uL (ref 0.0–0.2)
Basos: 0 %
EOS (ABSOLUTE): 0 10*3/uL (ref 0.0–0.4)
Eos: 0 %
Hematocrit: 37.2 % — ABNORMAL LOW (ref 37.5–51.0)
Hemoglobin: 11.8 g/dL — ABNORMAL LOW (ref 13.0–17.7)
Immature Grans (Abs): 0.1 10*3/uL (ref 0.0–0.1)
Immature Granulocytes: 1 %
Lymphocytes Absolute: 0.8 10*3/uL (ref 0.7–3.1)
Lymphs: 7 %
MCH: 26 pg — ABNORMAL LOW (ref 26.6–33.0)
MCHC: 31.7 g/dL (ref 31.5–35.7)
MCV: 82 fL (ref 79–97)
Monocytes Absolute: 0.9 10*3/uL (ref 0.1–0.9)
Monocytes: 7 %
Neutrophils Absolute: 10.2 10*3/uL — ABNORMAL HIGH (ref 1.4–7.0)
Neutrophils: 85 %
Platelets: 210 10*3/uL (ref 150–450)
RBC: 4.53 x10E6/uL (ref 4.14–5.80)
RDW: 14.5 % (ref 11.6–15.4)
WBC: 12.1 10*3/uL — ABNORMAL HIGH (ref 3.4–10.8)

## 2022-05-10 LAB — SYNOVIAL FLUID PANEL
Eos, Fluid: 0 %
Glucose, Fluid: 80 mg/dL
Lining Cells, Synovial: 0 %
Lymphs, Fluid: 2 %
Macrophages Fld: 0 %
Nuc cell # Fld: 6973 cells/uL — ABNORMAL HIGH (ref 0–200)
Polys, Fluid: 98 %
Protein, Fluid: 4.5 g/dL
RBC, Fluid: 3000 /uL

## 2022-05-10 LAB — COMPREHENSIVE METABOLIC PANEL
ALT: 10 IU/L (ref 0–44)
AST: 22 IU/L (ref 0–40)
Albumin/Globulin Ratio: 1.1 — ABNORMAL LOW (ref 1.2–2.2)
Albumin: 3.6 g/dL — ABNORMAL LOW (ref 3.8–4.8)
Alkaline Phosphatase: 87 IU/L (ref 44–121)
BUN/Creatinine Ratio: 13 (ref 10–24)
BUN: 13 mg/dL (ref 8–27)
Bilirubin Total: 1.6 mg/dL — ABNORMAL HIGH (ref 0.0–1.2)
CO2: 23 mmol/L (ref 20–29)
Calcium: 8.9 mg/dL (ref 8.6–10.2)
Chloride: 101 mmol/L (ref 96–106)
Creatinine, Ser: 0.99 mg/dL (ref 0.76–1.27)
Globulin, Total: 3.4 g/dL (ref 1.5–4.5)
Glucose: 110 mg/dL — ABNORMAL HIGH (ref 70–99)
Potassium: 4.5 mmol/L (ref 3.5–5.2)
Sodium: 137 mmol/L (ref 134–144)
Total Protein: 7 g/dL (ref 6.0–8.5)
eGFR: 81 mL/min/{1.73_m2} (ref >59)

## 2022-05-11 ENCOUNTER — Ambulatory Visit (INDEPENDENT_AMBULATORY_CARE_PROVIDER_SITE_OTHER): Payer: Medicare HMO

## 2022-05-11 ENCOUNTER — Other Ambulatory Visit: Payer: Self-pay | Admitting: Cardiovascular Disease

## 2022-05-11 VITALS — BP 110/70 | HR 90 | Temp 97.7°F | Ht 67.0 in | Wt 224.8 lb

## 2022-05-11 DIAGNOSIS — Z0000 Encounter for general adult medical examination without abnormal findings: Secondary | ICD-10-CM | POA: Diagnosis not present

## 2022-05-11 NOTE — Progress Notes (Signed)
Subjective:   [name] is a 72 y.o. male who presents for Medicare Annual/Subsequent preventive examination.  Review of Systems     Cardiac Risk Factors include: advanced age (>47men, >79 women);dyslipidemia;male gender;obesity (BMI >30kg/m2)     Objective:    Today's Vitals   05/11/22 1511 05/11/22 1517 05/11/22 1540  BP: 110/70    Pulse: (!) 115  90  Temp: 97.7 F (36.5 C)    TempSrc: Oral    SpO2: 95%    Weight: 224 lb 12.8 oz (102 kg)    Height: 5\' 7"  (1.702 m)    PainSc:  7     Body mass index is 35.21 kg/m.     05/11/2022    3:31 PM 05/05/2021    9:31 AM 04/30/2020    9:48 AM 04/04/2019    8:17 AM 02/06/2019   10:50 AM 10/12/2018    8:00 AM 10/09/2018    3:22 PM  Advanced Directives  Does Patient Have a Medical Advance Directive? No No No No No No No  Would patient like information on creating a medical advance directive?  Yes (ED - Information included in AVS) Yes (ED - Information included in AVS) No - Patient declined Yes (MAU/Ambulatory/Procedural Areas - Information given) No - Patient declined No - Patient declined    Current Medications (verified) Outpatient Encounter Medications as of 05/11/2022  Medication Sig   amoxicillin (AMOXIL) 500 MG capsule TAKE FOUR CAPSULES BY MOUTH ONE HOUR PRIOR TO DENTAL APPOINTMENT   atorvastatin (LIPITOR) 20 MG tablet Take 20 mg by mouth daily.   calcium carbonate (TUMS - DOSED IN MG ELEMENTAL CALCIUM) 500 MG chewable tablet Chew 1 tablet by mouth as needed for indigestion or heartburn.   metoprolol tartrate (LOPRESSOR) 25 MG tablet TAKE 1 TABLET BY MOUTH 2 TIMES A DAY   oxymetazoline (AFRIN) 0.05 % nasal spray Place 1 spray into both nostrils at bedtime as needed for congestion.   warfarin (COUMADIN) 7.5 MG tablet TAKE 1 TABLET BY MOUTH EVERY DAY   No facility-administered encounter medications on file as of 05/11/2022.    Allergies (verified) Tylenol [acetaminophen]   History: Past Medical History:  Diagnosis Date    BCE (basal cell epithelioma), arm    RIGHT SHOULDER   Bradycardia    Bumps on skin    on left side of nose for last 3 weeks   CAD (coronary artery disease)    a. s/p CABGx2 10/11/18 LIMA to LAD, SVG to RCA, EVH via right thigh.   Dyslipidemia    Ectatic thoracic aorta    Esophageal stricture    GERD (gastroesophageal reflux disease)    Heart murmur    sees dr Clydene Fake   Hyperlipidemia    Incidental pulmonary nodule, > 59mm and < 19mm 10/04/2018   Noted on CTA   Inguinal hernia    bilateral   Obesity    Presence of permanent cardiac pacemaker    Prostate cancer    S/P aortic valve replacement with bioprosthetic valve 10/11/2018   23 mm Edwards Inspiris Resilia stented bovine pericardial tissue valve, miltral valve done also   S/P CABG x 2 10/11/2018   LIMA to LAD, SVG to RCA, EVH via right thigh   S/P mitral valve replacement with bioprosthetic valve 10/11/2018   27 mm Medtronic Mosaic stented porcine bioprosthetic tissue valve   S/P patent foramen ovale closure 10/11/2018   S/P placement of cardiac pacemaker    Smoker    Past Surgical History:  Procedure Laterality Date   AORTIC VALVE REPLACEMENT N/A 10/11/2018   Procedure: AORTIC VALVE REPLACEMENT (AVR) with 23 Inspiris Bioprosthetic Aortic valve.;  Surgeon: Rexene Alberts, MD;  Location: [place];  Service: Open Heart Surgery;  Laterality: N/A;   CLIPPING OF ATRIAL APPENDAGE N/A 10/11/2018   Procedure: CLIPPING OF LEFT ATRIAL APPENDAGE with 45 AtriCure Clip.;  Surgeon: Rexene Alberts, MD;  Location: [place];  Service: Open Heart Surgery;  Laterality: N/A;   COLONOSCOPY  03/2008,2004   Dr. Amedeo Plenty   CORONARY ARTERY BYPASS GRAFT N/A 10/11/2018   Procedure: CORONARY ARTERY BYPASS GRAFTING (CABG) x 2 using LIMA to the LAD and endoscopic greater saphenous vein harvesting to the RCA.;  Surgeon: Rexene Alberts, MD;  Location: [place];  Service: Open Heart Surgery;  Laterality: N/A;   CYSTOSCOPY  04/04/2019   Procedure: CYSTOSCOPY;  Surgeon:  Ceasar Mons, MD;  Location: [facility];  Service: Urology;;   HERNIA REPAIR  12/11/10   BIH   MITRAL VALVE REPLACEMENT N/A 10/11/2018   Procedure: MITRAL VALVE (MV) REPLACEMENT with 27 Mosaic Bioprosthetic Mitral valve.;  Surgeon: Rexene Alberts, MD;  Location: [place];  Service: Open Heart Surgery;  Laterality: N/A;   PACEMAKER IMPLANT N/A 10/17/2018   Procedure: PACEMAKER IMPLANT;  Surgeon: Thompson Grayer, MD;  Location: [place] [facility];  Service: Cardiovascular;  Laterality: N/A;   RADIOACTIVE SEED IMPLANT N/A 04/04/2019   Procedure: RADIOACTIVE SEED IMPLANT/BRACHYTHERAPY IMPLANT, cystoscopy;  Surgeon: Ceasar Mons, MD;  Location: [facility];  Service: Urology;  Laterality: N/A;  58 seeds   REPAIR OF PATENT FORAMEN OVALE N/A 10/11/2018   Procedure: CLOSURE OF PATENT FORAMEN OVALE;  Surgeon: Rexene Alberts, MD;  Location: [place];  Service: Open Heart Surgery;  Laterality: N/A;   RIGHT/LEFT HEART CATH AND CORONARY ANGIOGRAPHY N/A 09/15/2018   Procedure: RIGHT/LEFT HEART CATH AND CORONARY ANGIOGRAPHY;  Surgeon: Burnell Blanks, MD;  Location: [place] [facility];  Service: Cardiovascular;  Laterality: N/A;   TEE WITHOUT CARDIOVERSION N/A 12/12/2015   Procedure: TRANSESOPHAGEAL ECHOCARDIOGRAM (TEE);  Surgeon: Sanda Klein, MD;  Location: [facility];  Service: Cardiovascular;  Laterality: N/A;   TEE WITHOUT CARDIOVERSION N/A 10/11/2018   Procedure: TRANSESOPHAGEAL ECHOCARDIOGRAM (TEE);  Surgeon: Rexene Alberts, MD;  Location: [place];  Service: Open Heart Surgery;  Laterality: N/A;   THYMECTOMY  1976   radaiation tx done   Family History  Problem Relation Age of Onset   Colon cancer Mother    Lung cancer Father        smoker   CAD Neg Hx    Pancreatic cancer Neg Hx    Breast cancer Neg Hx    Social History   Socioeconomic History   Marital status: Married    Spouse name: Not on file   Number of children: 0   Years of  education: Not on file   Highest education level: Not on file  Occupational History   Occupation: Programme researcher, broadcasting/film/video: [employer]  Tobacco Use   Smoking status: Former    Packs/day: 0.25    Years: 35.00    Additional pack years: 0.00    Total pack years: 8.75    Types: Cigarettes    Quit date: 09/04/2018    Years since quitting: 3.6   Smokeless tobacco: Never  Vaping Use   Vaping Use: Never used  Substance and Sexual Activity   Alcohol use: Yes    Alcohol/week: 1.0 standard  drink of alcohol    Types: 1 Cans of beer per week    Comment: rare   Drug use: No   Sexual activity: Yes  Other Topics Concern   Not on file  Social History Narrative   Not on file   Social Determinants of Health   Financial Resource Strain: Low Risk  (05/11/2022)   Overall Financial Resource Strain (CARDIA)    Difficulty of Paying Living Expenses: Not hard at all  Food Insecurity: No Food Insecurity (05/11/2022)   Hunger Vital Sign    Worried About Running Out of Food in the Last Year: Never true    Ran Out of Food in the Last Year: Never true  Transportation Needs: No Transportation Needs (05/11/2022)   PRAPARE - Hydrologist (Medical): No    Lack of Transportation (Non-Medical): No  Physical Activity: Inactive (05/11/2022)   Exercise Vital Sign    Days of Exercise per Week: 0 days    Minutes of Exercise per Session: 0 min  Stress: No Stress Concern Present (05/11/2022)   [place]    Feeling of Stress : Not at all  Social Connections: Not on file    Tobacco Counseling Counseling given: Not Answered   Clinical Intake:  Pre-visit preparation completed: Yes  Pain : 0-10 Pain Score: 7  Pain Type: Acute pain Pain Location: Leg Pain Orientation: Lower, Left Pain Descriptors / Indicators: Throbbing Pain Onset: In the past 7 days Pain Frequency: Intermittent     Nutritional Status: BMI > 30   Obese Nutritional Risks: None Diabetes: No  How often do you need to have someone help you when you read instructions, pamphlets, or other written materials from your doctor or pharmacy?: 1 - Never  Diabetic? no  Interpreter Needed?: No  Information entered by :: NAllen LPN   Activities of Daily Living    05/11/2022    3:33 PM 05/08/2022   10:19 AM  In your present state of health, do you have any difficulty performing the following activities:  Hearing? 1 0  Comment no hearing aids   Vision? 0 0  Difficulty concentrating or making decisions? 0 0  Walking or climbing stairs? 1 0  Comment due to leg pain   Dressing or bathing? 0 0  Doing errands, shopping? 0 0  Preparing Food and eating ? N N  Using the Toilet? N N  In the past six months, have you accidently leaked urine? Y Y  Do you have problems with loss of bowel control? N N  Managing your Medications? N N  Managing your Finances? N N  Housekeeping or managing your Housekeeping? N N    Patient Care Team: Denita Lung, MD as PCP - General (Family Medicine) Burnell Blanks, MD as PCP - Cardiology (Cardiology) Ceasar Mons, MD as Consulting Physician (Urology) Cira Rue, RN Nurse Navigator as Registered Nurse ([place]) Tyler Pita, MD as Consulting Physician (Radiation Oncology)  Indicate any recent [place] you may have received from other than [facility] providers in the past year (date may be approximate).     Assessment:   This is a routine wellness examination for [place].  Hearing/Vision screen Vision Screening - Comments:: Regular eye exams,   Dietary issues and exercise activities discussed: Current Exercise Habits: The patient does not participate in regular exercise at present   Goals Addressed  This Visit's Progress    Patient Stated       05/11/2022, wants to feel better       Depression Screen    05/11/2022    3:32 PM 05/05/2022   12:02  PM 05/05/2021    9:31 AM 04/30/2020    9:49 AM 04/30/2019    9:47 AM 04/28/2018    9:23 AM 04/26/2017   11:10 AM  PHQ 2/9 Scores  PHQ - 2 Score 0 0 0 0 0 0 0  PHQ- 9 Score 0          Fall Risk    05/11/2022    3:31 PM 05/08/2022   10:19 AM 03/24/2022    1:24 PM 05/05/2021    9:31 AM 04/30/2020    9:49 AM  Fall Risk   Falls in the past year? 1 1 0 0 0  Comment fell down steps      Number falls in past yr: 0 0 0 0 0  Injury with Fall? 0 0 0 0 0  Risk for fall due to : Medication side effect  No Fall Risks No Fall Risks No Fall Risks  Follow up Falls prevention discussed;Education provided;Falls evaluation completed  Falls evaluation completed Falls evaluation completed Falls evaluation completed    FALL RISK PREVENTION PERTAINING TO THE HOME:  Any stairs in or around the home? Yes  If so, are there any without handrails? Yes  Home free of loose throw rugs in walkways, pet beds, electrical cords, etc? Yes  Adequate lighting in your home to reduce risk of falls? Yes   ASSISTIVE DEVICES UTILIZED TO PREVENT FALLS:  Life alert? No  Use of a cane, walker or w/c? No  Grab bars in the bathroom? No  Shower chair or bench in shower? No  Elevated toilet seat or a handicapped toilet? No   TIMED UP AND GO:  Was the test performed? Yes .  Length of time to ambulate 10 feet: 8 sec.   Gait slow and steady without use of assistive device  Cognitive Function:        05/11/2022    3:35 PM 05/05/2021    9:33 AM  6CIT Screen  What Year? 0 points 0 points  What month? 0 points 0 points  What time? 3 points 0 points  Count back from 20 0 points 0 points  Months in reverse 0 points 0 points  Repeat phrase 6 points 0 points  Total Score 9 points 0 points    Immunizations Immunization History  Administered Date(s) Administered   DTaP 10/31/1995   Fluad Quad(high Dose 65+) 10/20/2018, 02/26/2022   Influenza Split 10/23/2010, 12/27/2011   Influenza, High Dose Seasonal PF 11/05/2015,  04/26/2017   Influenza,inj,Quad PF,6+ Mos 12/17/2013   Influenza-Unspecified 11/26/2020   Janssen (J&J) SARS-COV-2 Vaccination 05/21/2019, 02/27/2020   PPD Test 11/05/2015   Pfizer Covid-19 Vaccine Bivalent Booster 69yrs & up 05/05/2021   Pneumococcal Conjugate-13 11/05/2015   Pneumococcal Polysaccharide-23 10/23/2010   Tdap 12/27/2011   Zoster Recombinat (Shingrix) 05/29/2021, 09/07/2021   Zoster, Live 10/23/2010    TDAP status: Due, Education has been provided regarding the importance of this vaccine. Advised may receive this vaccine at local pharmacy or Health Dept. Aware to provide a copy of the vaccination record if obtained from local pharmacy or Health Dept. Verbalized acceptance and understanding.  Flu Vaccine status: Up to date  Pneumococcal vaccine status: Up to date  Covid-19 vaccine status: Completed vaccines  Qualifies for Shingles Vaccine?  Yes   Zostavax completed Yes   Shingrix Completed?: Yes  Screening Tests Health Maintenance  Topic Date Due   Pneumonia Vaccine 34+ Years old (3 of 3 - PPSV23 or PCV20) 11/04/2020   COVID-19 Vaccine (4 - 2023-24 season) 10/09/2021   DTaP/Tdap/Td (3 - Td or Tdap) 12/26/2021   INFLUENZA VACCINE  09/09/2022   Medicare Annual Wellness (AWV)  05/11/2023   COLONOSCOPY (Pts 45-37yrs Insurance coverage will need to be confirmed)  06/02/2023   Hepatitis C Screening  Completed   Zoster Vaccines- Shingrix  Completed   HPV VACCINES  Aged Out    Health Maintenance  Health Maintenance Due  Topic Date Due   Pneumonia Vaccine 79+ Years old (3 of 3 - PPSV23 or PCV20) 11/04/2020   COVID-19 Vaccine (4 - 2023-24 season) 10/09/2021   DTaP/Tdap/Td (3 - Td or Tdap) 12/26/2021    Colorectal cancer screening: scheduled for one this year   Lung Cancer Screening: (Low Dose CT Chest recommended if Age 14-80 years, 30 pack-year currently smoking OR have quit w/in 15years.) does not qualify.   Lung Cancer Screening Referral: no  Additional  Screening:  Hepatitis C Screening: does qualify; Completed 03/25/2015  Vision Screening: Recommended annual ophthalmology exams for early detection of glaucoma and other disorders of the eye. Is the patient up to date with their annual eye exam?  Yes  Who is the provider or what is the name of the office in which the patient attends annual eye exams? none If pt is not established with a provider, would they like to be referred to a provider to establish care? No .   Dental Screening: Recommended annual dental exams for proper oral hygiene  Community Resource Referral / Chronic Care Management: CRR required this visit?  No   CCM required this visit?  No      Plan:     I have personally reviewed and noted the following in the patient's chart:   Medical and social history Use of alcohol, tobacco or illicit drugs  Current medications and supplements including opioid prescriptions. Patient is not currently taking opioid prescriptions. Functional ability and status Nutritional status Physical activity Advanced directives List of other physicians Hospitalizations, surgeries, and ER visits in previous 12 months Vitals Screenings to include cognitive, depression, and falls Referrals and appointments  In addition, I have reviewed and discussed with patient certain preventive protocols, quality metrics, and best practice recommendations. A written personalized care plan for preventive services as well as general preventive health recommendations were provided to patient.     Kellie Simmering, LPN   [id number]   Nurse Notes: pain in left calf. Appointment made to see Dr. Redmond School tomorrow morning.

## 2022-05-11 NOTE — Patient Instructions (Signed)
[name] , Thank you for taking time to come for your Medicare Wellness Visit. I appreciate your ongoing commitment to your health goals. Please review the following plan we discussed and let me know if I can assist you in the future.   These are the goals we discussed:  Goals      Patient Stated     05/11/2022, wants to feel better        This is a list of the screening recommended for you and due dates:  Health Maintenance  Topic Date Due   Pneumonia Vaccine (3 of 3 - PPSV23 or PCV20) 11/04/2020   COVID-19 Vaccine (4 - 2023-24 season) 10/09/2021   DTaP/Tdap/Td vaccine (3 - Td or Tdap) 12/26/2021   Flu Shot  09/09/2022   Medicare Annual Wellness Visit  05/11/2023   Colon Cancer Screening  06/02/2023   Hepatitis C Screening: USPSTF Recommendation to screen - Ages 18-79 yo.  Completed   Zoster (Shingles) Vaccine  Completed   HPV Vaccine  Aged Out    Advanced directives: Advance directive discussed with you today. Even though you declined this today please call our office should you change your mind and we can give you the proper paperwork for you to fill out.  Conditions/risks identified: pain in left calf  Next appointment: Follow up in one year for your annual wellness visit.   Preventive Care 30 Years and Older, Male  Preventive care refers to lifestyle choices and visits with your health care provider that can promote health and wellness. What does preventive care include? A yearly physical exam. This is also called an annual well check. Dental exams once or twice a year. Routine eye exams. Ask your health care provider how often you should have your eyes checked. Personal lifestyle choices, including: Daily care of your teeth and gums. Regular physical activity. Eating a healthy diet. Avoiding tobacco and drug use. Limiting alcohol use. Practicing safe sex. Taking low doses of aspirin every day. Taking vitamin and mineral supplements as recommended by your health care  provider. What happens during an annual well check? The services and screenings done by your health care provider during your annual well check will depend on your age, overall health, lifestyle risk factors, and family history of disease. Counseling  Your health care provider may ask you questions about your: Alcohol use. Tobacco use. Drug use. Emotional well-being. Home and relationship well-being. Sexual activity. Eating habits. History of falls. Memory and ability to understand (cognition). Work and work Statistician. Screening  You may have the following tests or measurements: Height, weight, and BMI. Blood pressure. Lipid and cholesterol levels. These may be checked every 5 years, or more frequently if you are over 43 years old. Skin check. Lung cancer screening. You may have this screening every year starting at age 22 if you have a 30-pack-year history of smoking and currently smoke or have quit within the past 15 years. Fecal occult blood test (FOBT) of the stool. You may have this test every year starting at age 89. Flexible sigmoidoscopy or colonoscopy. You may have a sigmoidoscopy every 5 years or a colonoscopy every 10 years starting at age 10. Prostate cancer screening. Recommendations will vary depending on your family history and other risks. Hepatitis C blood test. Hepatitis B blood test. Sexually transmitted disease (STD) testing. Diabetes screening. This is done by checking your blood sugar (glucose) after you have not eaten for a while (fasting). You may have this done every 1-3 years.  Abdominal aortic aneurysm (AAA) screening. You may need this if you are a current or former smoker. Osteoporosis. You may be screened starting at age 44 if you are at high risk. Talk with your health care provider about your test results, treatment options, and if necessary, the need for more tests. Vaccines  Your health care provider may recommend certain vaccines, such  as: Influenza vaccine. This is recommended every year. Tetanus, diphtheria, and acellular pertussis (Tdap, Td) vaccine. You may need a Td booster every 10 years. Zoster vaccine. You may need this after age 61. Pneumococcal 13-valent conjugate (PCV13) vaccine. One dose is recommended after age 73. Pneumococcal polysaccharide (PPSV23) vaccine. One dose is recommended after age 82. Talk to your health care provider about which screenings and vaccines you need and how often you need them. This information is not intended to replace advice given to you by your health care provider. Make sure you discuss any questions you have with your health care provider. Document Released: 02/21/2015 Document Revised: 10/15/2015 Document Reviewed: 11/26/2014 Elsevier Interactive Patient Education  2017 [place] Prevention in the Home Falls can cause injuries. They can happen to people of all ages. There are many things you can do to make your home safe and to help prevent falls. What can I do on the outside of my home? Regularly fix the edges of walkways and driveways and fix any cracks. Remove anything that might make you trip as you walk through a door, such as a raised step or threshold. Trim any bushes or trees on the path to your home. Use bright outdoor lighting. Clear any walking paths of anything that might make someone trip, such as rocks or tools. Regularly check to see if handrails are loose or broken. Make sure that both sides of any steps have handrails. Any raised decks and porches should have guardrails on the edges. Have any leaves, snow, or ice cleared regularly. Use sand or salt on walking paths during winter. Clean up any spills in your garage right away. This includes oil or grease spills. What can I do in the bathroom? Use night lights. Install grab bars by the toilet and in the tub and shower. Do not use towel bars as grab bars. Use non-skid mats or decals in the tub or  shower. If you need to sit down in the shower, use a plastic, non-slip stool. Keep the floor dry. Clean up any water that spills on the floor as soon as it happens. Remove soap buildup in the tub or shower regularly. Attach bath mats securely with double-sided non-slip rug tape. Do not have throw rugs and other things on the floor that can make you trip. What can I do in the bedroom? Use night lights. Make sure that you have a light by your bed that is easy to reach. Do not use any sheets or blankets that are too big for your bed. They should not hang down onto the floor. Have a firm chair that has side arms. You can use this for support while you get dressed. Do not have throw rugs and other things on the floor that can make you trip. What can I do in the kitchen? Clean up any spills right away. Avoid walking on wet floors. Keep items that you use a lot in easy-to-reach places. If you need to reach something above you, use a strong step stool that has a grab bar. Keep electrical cords out of the way. Do not  use floor polish or wax that makes floors slippery. If you must use wax, use non-skid floor wax. Do not have throw rugs and other things on the floor that can make you trip. What can I do with my stairs? Do not leave any items on the stairs. Make sure that there are handrails on both sides of the stairs and use them. Fix handrails that are broken or loose. Make sure that handrails are as long as the stairways. Check any carpeting to make sure that it is firmly attached to the stairs. Fix any carpet that is loose or worn. Avoid having throw rugs at the top or bottom of the stairs. If you do have throw rugs, attach them to the floor with carpet tape. Make sure that you have a light switch at the top of the stairs and the bottom of the stairs. If you do not have them, ask someone to add them for you. What else can I do to help prevent falls? Wear shoes that: Do not have high heels. Have  rubber bottoms. Are comfortable and fit you well. Are closed at the toe. Do not wear sandals. If you use a stepladder: Make sure that it is fully opened. Do not climb a closed stepladder. Make sure that both sides of the stepladder are locked into place. Ask someone to hold it for you, if possible. Clearly mark and make sure that you can see: Any grab bars or handrails. First and last steps. Where the edge of each step is. Use tools that help you move around (mobility aids) if they are needed. These include: Canes. Walkers. Scooters. Crutches. Turn on the lights when you go into a dark area. Replace any light bulbs as soon as they burn out. Set up your furniture so you have a clear path. Avoid moving your furniture around. If any of your floors are uneven, fix them. If there are any pets around you, be aware of where they are. Review your medicines with your doctor. Some medicines can make you feel dizzy. This can increase your chance of falling. Ask your doctor what other things that you can do to help prevent falls. This information is not intended to replace advice given to you by your health care provider. Make sure you discuss any questions you have with your health care provider. Document Released: 11/21/2008 Document Revised: 07/03/2015 Document Reviewed: 03/01/2014 Elsevier Interactive Patient Education  2017 [employer].

## 2022-05-12 ENCOUNTER — Ambulatory Visit (INDEPENDENT_AMBULATORY_CARE_PROVIDER_SITE_OTHER): Payer: Medicare HMO | Admitting: Family Medicine

## 2022-05-12 ENCOUNTER — Encounter: Payer: Self-pay | Admitting: Family Medicine

## 2022-05-12 ENCOUNTER — Ambulatory Visit: Payer: Medicare HMO | Attending: Cardiovascular Disease | Admitting: *Deleted

## 2022-05-12 VITALS — BP 110/62 | HR 103 | Temp 97.5°F | Resp 20 | Wt 220.6 lb

## 2022-05-12 DIAGNOSIS — M25562 Pain in left knee: Secondary | ICD-10-CM | POA: Diagnosis not present

## 2022-05-12 DIAGNOSIS — Z953 Presence of xenogenic heart valve: Secondary | ICD-10-CM | POA: Diagnosis not present

## 2022-05-12 DIAGNOSIS — Z8774 Personal history of (corrected) congenital malformations of heart and circulatory system: Secondary | ICD-10-CM

## 2022-05-12 DIAGNOSIS — Z5181 Encounter for therapeutic drug level monitoring: Secondary | ICD-10-CM

## 2022-05-12 DIAGNOSIS — Z951 Presence of aortocoronary bypass graft: Secondary | ICD-10-CM

## 2022-05-12 DIAGNOSIS — Z950 Presence of cardiac pacemaker: Secondary | ICD-10-CM

## 2022-05-12 DIAGNOSIS — M79662 Pain in left lower leg: Secondary | ICD-10-CM

## 2022-05-12 DIAGNOSIS — R600 Localized edema: Secondary | ICD-10-CM | POA: Diagnosis not present

## 2022-05-12 LAB — POCT INR: INR: 4.5 — AB (ref 2.0–3.0)

## 2022-05-12 NOTE — Patient Instructions (Addendum)
Description   Do not take any warfarin today and do not take any warfarin tomorrow then START taking 1 tablet (7.5mg ) daily except 1/2 tablet on Mondays and Fridays. Recheck INR in 3 weeks. [facility] [phone] Main [phone] Procedure Fax [phone] or [phone] Pending colonoscopy June 2024, consult June 7th

## 2022-05-12 NOTE — Progress Notes (Addendum)
   Subjective:    Patient ID: [name], male    DOB: 1950/10/06, 72 y.o.   MRN: [id number]  HPI He states that on Monday he noted some left calf discomfort.  No history of injury or overuse.  He gets worse with walking.  There has been no shortness of breath or chest pain associated with this.  He has been on Coumadin since 2020.  His last PT/INR was 3.6 He recently had a left knee effusion and no crystals were noted.  After the steroid was placed, he states that his knee is doing much better.  Review of Systems     Objective:   Physical Exam Exam of the left leg does show a positive Homans test.  It is not hot or erythematous.  Slight distal edema is noted.       Assessment & Plan:  Pain of left calf  Pacemaker  S/P aortic + mitral valve replacement with bioprosthetic valves  Acute pain of left knee Since he has a positive Homans' sign in spite of him being on adequate Coumadin therapy I think ultrasound is appropriate. Ultrasound was ordered and it turned out negative.  Recommend supportive care with Tylenol and heat to the area.

## 2022-05-17 ENCOUNTER — Ambulatory Visit (INDEPENDENT_AMBULATORY_CARE_PROVIDER_SITE_OTHER): Payer: Medicare HMO

## 2022-05-17 DIAGNOSIS — I442 Atrioventricular block, complete: Secondary | ICD-10-CM

## 2022-05-17 LAB — CUP PACEART REMOTE DEVICE CHECK
Battery Remaining Longevity: 90 mo
Battery Voltage: 2.98 V
Brady Statistic AP VP Percent: 0.09 %
Brady Statistic AP VS Percent: 0 %
Brady Statistic AS VP Percent: 95.49 %
Brady Statistic AS VS Percent: 4.41 %
Brady Statistic RA Percent Paced: 0.11 %
Brady Statistic RV Percent Paced: 95.59 %
Date Time Interrogation Session: 20240406220918
Implantable Lead Connection Status: 753985
Implantable Lead Connection Status: 753985
Implantable Lead Implant Date: 20200908
Implantable Lead Implant Date: 20200908
Implantable Lead Location: 753859
Implantable Lead Location: 753860
Implantable Lead Model: 3830
Implantable Lead Model: 5076
Implantable Pulse Generator Implant Date: 20200908
Lead Channel Impedance Value: 285 Ohm
Lead Channel Impedance Value: 323 Ohm
Lead Channel Impedance Value: 323 Ohm
Lead Channel Impedance Value: 437 Ohm
Lead Channel Pacing Threshold Amplitude: 0.5 V
Lead Channel Pacing Threshold Amplitude: 1 V
Lead Channel Pacing Threshold Pulse Width: 0.4 ms
Lead Channel Pacing Threshold Pulse Width: 0.4 ms
Lead Channel Sensing Intrinsic Amplitude: 1.25 mV
Lead Channel Sensing Intrinsic Amplitude: 1.25 mV
Lead Channel Sensing Intrinsic Amplitude: 10.875 mV
Lead Channel Sensing Intrinsic Amplitude: 10.875 mV
Lead Channel Setting Pacing Amplitude: 1.5 V
Lead Channel Setting Pacing Amplitude: 2.5 V
Lead Channel Setting Pacing Pulse Width: 0.8 ms
Lead Channel Setting Sensing Sensitivity: 1.2 mV
Zone Setting Status: 755011
Zone Setting Status: 755011

## 2022-05-18 ENCOUNTER — Encounter: Payer: Self-pay | Admitting: Family Medicine

## 2022-05-18 ENCOUNTER — Ambulatory Visit (INDEPENDENT_AMBULATORY_CARE_PROVIDER_SITE_OTHER): Payer: Medicare HMO | Admitting: Family Medicine

## 2022-05-18 VITALS — BP 108/70 | HR 84 | Temp 97.7°F | Resp 16 | Ht 67.0 in | Wt 223.0 lb

## 2022-05-18 DIAGNOSIS — I442 Atrioventricular block, complete: Secondary | ICD-10-CM | POA: Diagnosis not present

## 2022-05-18 DIAGNOSIS — E669 Obesity, unspecified: Secondary | ICD-10-CM

## 2022-05-18 DIAGNOSIS — E291 Testicular hypofunction: Secondary | ICD-10-CM

## 2022-05-18 DIAGNOSIS — Z951 Presence of aortocoronary bypass graft: Secondary | ICD-10-CM

## 2022-05-18 DIAGNOSIS — Z0000 Encounter for general adult medical examination without abnormal findings: Secondary | ICD-10-CM

## 2022-05-18 DIAGNOSIS — Z953 Presence of xenogenic heart valve: Secondary | ICD-10-CM

## 2022-05-18 DIAGNOSIS — Z8719 Personal history of other diseases of the digestive system: Secondary | ICD-10-CM

## 2022-05-18 DIAGNOSIS — I4892 Unspecified atrial flutter: Secondary | ICD-10-CM | POA: Diagnosis not present

## 2022-05-18 DIAGNOSIS — Z950 Presence of cardiac pacemaker: Secondary | ICD-10-CM

## 2022-05-18 DIAGNOSIS — D692 Other nonthrombocytopenic purpura: Secondary | ICD-10-CM

## 2022-05-18 DIAGNOSIS — E782 Mixed hyperlipidemia: Secondary | ICD-10-CM | POA: Diagnosis not present

## 2022-05-18 DIAGNOSIS — C61 Malignant neoplasm of prostate: Secondary | ICD-10-CM

## 2022-05-18 DIAGNOSIS — I700 Atherosclerosis of aorta: Secondary | ICD-10-CM | POA: Diagnosis not present

## 2022-05-18 MED ORDER — ATORVASTATIN CALCIUM 20 MG PO TABS: 20.0000 mg | ORAL_TABLET | Freq: Every day | ORAL | 3 refills | Status: DC

## 2022-05-18 NOTE — Progress Notes (Signed)
Complete physical exam  Patient: [name]   DOB: 1950/08/08   72 y.o. Male  MRN: [id number]  Subjective:    Chief Complaint  Patient presents with   Annual Exam    CPE. Had AWV with [facility] on 05/11/2022. Fasting.     [name] is a 72 y.o. male who presents today for a complete physical exam. He reports consuming a general diet. The patient does not participate in regular exercise at present. He generally feels fairly well. He reports sleeping fairly well. He does have additional problems to discuss today.  He does state that he thinks he has gotten his appetite back.  He is scheduled to see GI June 7.  He does have a history of esophageal stricture but has had no recent symptoms relating to that.  He continues to be followed by cardiology for his underlying cardiac disease including history of atrial flutter, AV block, sick sinus syndrome.  He does have a pacer in place.  He also has a history of aortic atherosclerosis.  Continues on Lipitor.  He is also taking warfarin and is followed by the Coumadin clinic.  He continues on metoprolol.  He does see urology regularly.  He did have radioactive pellets placed.  He is not on any testosterone replacement although does have a history of hypogonadism.  He does state that the left leg swelling is now diminished.  Otherwise he has no particular concerns or complaints.  He is having no urinary symptoms or ED problems.   Most recent fall risk assessment:    05/11/2022    3:31 PM  Fall Risk   Falls in the past year? 1  Comment fell down steps  Number falls in past yr: 0  Injury with Fall? 0  Risk for fall due to : Medication side effect  Follow up Falls prevention discussed;Education provided;Falls evaluation completed     Most recent depression screenings:    05/11/2022    3:32 PM 05/05/2022   12:02 PM  PHQ 2/9 Scores  PHQ - 2 Score 0 0  PHQ- 9 Score 0     Vision:Not within last year  and Dental: Receives regular dental  care    Patient Care Team: [name], MD as PCP - General (Family Medicine) Kathleene Hazel, MD as PCP - Cardiology (Cardiology) Rene Paci, MD as Consulting Physician (Urology) Felicita Gage, RN Nurse Navigator as Registered Nurse (Medical Oncology) Margaretmary Dys, MD as Consulting Physician (Radiation Oncology)   Outpatient Medications Prior to Visit  Medication Sig   atorvastatin (LIPITOR) 20 MG tablet Take 20 mg by mouth daily.   calcium carbonate (TUMS - DOSED IN MG ELEMENTAL CALCIUM) 500 MG chewable tablet Chew 1 tablet by mouth as needed for indigestion or heartburn.   metoprolol tartrate (LOPRESSOR) 25 MG tablet TAKE 1 TABLET BY MOUTH 2 TIMES A DAY   oxymetazoline (AFRIN) 0.05 % nasal spray Place 1 spray into both nostrils at bedtime as needed for congestion.   warfarin (COUMADIN) 7.5 MG tablet TAKE 1 TABLET BY MOUTH EVERY DAY   amoxicillin (AMOXIL) 500 MG capsule TAKE FOUR CAPSULES BY MOUTH ONE HOUR PRIOR TO DENTAL APPOINTMENT (Patient not taking: Reported on 05/18/2022)   No facility-administered medications prior to visit.    Review of Systems  All other systems reviewed and are negative.         Objective:     BP 108/70   Pulse 84   Temp 97.7 F (  36.5 C) (Oral)   Resp 16   Ht 5\' 7"  (1.702 m)   Wt 223 lb (101.2 kg)   SpO2 97% Comment: room air  BMI 34.93 kg/m    Physical Exam  Alert and in no distress. Tympanic membranes and canals are normal. Pharyngeal area is normal. Neck is supple without adenopathy or thyromegaly. Cardiac exam shows a regular sinus rhythm without murmurs or gallops. Lungs are clear to auscultation. Skin exam does show purpuric lesions present on both forearms.     Assessment & Plan:    Routine general medical examination at a health care facility  Atherosclerosis of aorta  Atrial flutter, unspecified type  Complete heart block  Hypogonadism in male  Malignant neoplasm of prostate  History of  esophageal stricture  Obesity (BMI 30-39.9)  Mixed hyperlipidemia - Plan: atorvastatin (LIPITOR) 20 MG tablet  Pacemaker  S/P aortic + mitral valve replacement with bioprosthetic valves  S/P CABG x 2  Senile purpura  Immunization History  Administered Date(s) Administered   DTaP 10/31/1995   Fluad Quad(high Dose 65+) 10/20/2018, 02/26/2022   Influenza Split 10/23/2010, 12/27/2011   Influenza, High Dose Seasonal PF 11/05/2015, 04/26/2017   Influenza,inj,Quad PF,6+ Mos 12/17/2013   Influenza-Unspecified 11/26/2020   Janssen (J&J) SARS-COV-2 Vaccination 05/21/2019, 02/27/2020   PPD Test 11/05/2015   Pfizer Covid-19 Vaccine Bivalent Booster 8yrs & up 05/05/2021   Pneumococcal Conjugate-13 11/05/2015   Pneumococcal Polysaccharide-23 10/23/2010   Tdap 12/27/2011   Zoster Recombinat (Shingrix) 05/29/2021, 09/07/2021   Zoster, Live 10/23/2010    Health Maintenance  Topic Date Due   Pneumonia Vaccine 41+ Years old (3 of 3 - PPSV23 or PCV20) 11/04/2020   COVID-19 Vaccine (4 - 2023-24 season) 10/09/2021   DTaP/Tdap/Td (3 - Td or Tdap) 12/26/2021   INFLUENZA VACCINE  09/09/2022   Medicare Annual Wellness (AWV)  05/11/2023   COLONOSCOPY (Pts 45-74yrs Insurance coverage will need to be confirmed)  06/02/2023   Hepatitis C Screening  Completed   Zoster Vaccines- Shingrix  Completed   HPV VACCINES  Aged Out    Discussed health benefits of physical activity, and encouraged him to engage in regular exercise appropriate for his age and condition.  He will continue to be followed by cardiology as well as urology and GI.  Continue on present medication.  Discussed the senile purpura in terms of cardiovascular risk.  Encouraged him to fill out the form for advanced directive.  Problem List Items Addressed This Visit       Cardiovascular and Mediastinum   Atherosclerosis of aorta   Atrial flutter   Complete heart block     Endocrine   Hypogonadism in male     Genitourinary    Malignant neoplasm of prostate     Other   History of esophageal stricture   Mixed hyperlipidemia   Obesity (BMI 30-39.9)   Pacemaker   S/P aortic + mitral valve replacement with bioprosthetic valves   S/P CABG x 2   Other Visit Diagnoses     Routine general medical examination at a health care facility    -  Primary      No follow-ups on file.     Benjiman Core, CMA

## 2022-05-23 ENCOUNTER — Encounter (HOSPITAL_COMMUNITY): Payer: Self-pay

## 2022-05-23 ENCOUNTER — Other Ambulatory Visit: Payer: Self-pay

## 2022-05-23 ENCOUNTER — Emergency Department (HOSPITAL_COMMUNITY): Payer: Medicare HMO

## 2022-05-23 ENCOUNTER — Observation Stay (HOSPITAL_BASED_OUTPATIENT_CLINIC_OR_DEPARTMENT_OTHER): Payer: Medicare HMO

## 2022-05-23 ENCOUNTER — Inpatient Hospital Stay (HOSPITAL_COMMUNITY)
Admission: EM | Admit: 2022-05-23 | Discharge: 2022-05-26 | DRG: 280 | Disposition: A | Discharge status: Home Health | Payer: Medicare HMO | Attending: Internal Medicine | Admitting: Internal Medicine

## 2022-05-23 DIAGNOSIS — M79662 Pain in left lower leg: Secondary | ICD-10-CM | POA: Diagnosis not present

## 2022-05-23 DIAGNOSIS — I214 Non-ST elevation (NSTEMI) myocardial infarction: Principal | ICD-10-CM | POA: Diagnosis present

## 2022-05-23 DIAGNOSIS — E871 Hypo-osmolality and hyponatremia: Secondary | ICD-10-CM | POA: Diagnosis present

## 2022-05-23 DIAGNOSIS — Z953 Presence of xenogenic heart valve: Secondary | ICD-10-CM

## 2022-05-23 DIAGNOSIS — R0609 Other forms of dyspnea: Principal | ICD-10-CM

## 2022-05-23 DIAGNOSIS — I442 Atrioventricular block, complete: Secondary | ICD-10-CM | POA: Diagnosis not present

## 2022-05-23 DIAGNOSIS — D638 Anemia in other chronic diseases classified elsewhere: Secondary | ICD-10-CM | POA: Diagnosis present

## 2022-05-23 DIAGNOSIS — Z950 Presence of cardiac pacemaker: Secondary | ICD-10-CM | POA: Diagnosis not present

## 2022-05-23 DIAGNOSIS — Z951 Presence of aortocoronary bypass graft: Secondary | ICD-10-CM

## 2022-05-23 DIAGNOSIS — R634 Abnormal weight loss: Secondary | ICD-10-CM | POA: Diagnosis present

## 2022-05-23 DIAGNOSIS — Z952 Presence of prosthetic heart valve: Secondary | ICD-10-CM

## 2022-05-23 DIAGNOSIS — I429 Cardiomyopathy, unspecified: Secondary | ICD-10-CM | POA: Diagnosis present

## 2022-05-23 DIAGNOSIS — I083 Combined rheumatic disorders of mitral, aortic and tricuspid valves: Secondary | ICD-10-CM | POA: Diagnosis present

## 2022-05-23 DIAGNOSIS — R0600 Dyspnea, unspecified: Secondary | ICD-10-CM | POA: Diagnosis not present

## 2022-05-23 DIAGNOSIS — R627 Adult failure to thrive: Secondary | ICD-10-CM | POA: Diagnosis present

## 2022-05-23 DIAGNOSIS — I2510 Atherosclerotic heart disease of native coronary artery without angina pectoris: Secondary | ICD-10-CM | POA: Diagnosis not present

## 2022-05-23 DIAGNOSIS — E782 Mixed hyperlipidemia: Secondary | ICD-10-CM | POA: Diagnosis present

## 2022-05-23 DIAGNOSIS — Z79899 Other long term (current) drug therapy: Secondary | ICD-10-CM | POA: Diagnosis not present

## 2022-05-23 DIAGNOSIS — Z8546 Personal history of malignant neoplasm of prostate: Secondary | ICD-10-CM

## 2022-05-23 DIAGNOSIS — Z85828 Personal history of other malignant neoplasm of skin: Secondary | ICD-10-CM | POA: Diagnosis not present

## 2022-05-23 DIAGNOSIS — Z7901 Long term (current) use of anticoagulants: Secondary | ICD-10-CM

## 2022-05-23 DIAGNOSIS — I4892 Unspecified atrial flutter: Secondary | ICD-10-CM | POA: Diagnosis not present

## 2022-05-23 DIAGNOSIS — R0602 Shortness of breath: Secondary | ICD-10-CM | POA: Diagnosis not present

## 2022-05-23 DIAGNOSIS — Z87891 Personal history of nicotine dependence: Secondary | ICD-10-CM

## 2022-05-23 DIAGNOSIS — M79605 Pain in left leg: Secondary | ICD-10-CM | POA: Diagnosis not present

## 2022-05-23 DIAGNOSIS — K219 Gastro-esophageal reflux disease without esophagitis: Secondary | ICD-10-CM | POA: Diagnosis present

## 2022-05-23 DIAGNOSIS — M7989 Other specified soft tissue disorders: Secondary | ICD-10-CM

## 2022-05-23 DIAGNOSIS — M25462 Effusion, left knee: Secondary | ICD-10-CM | POA: Diagnosis present

## 2022-05-23 DIAGNOSIS — Z8774 Personal history of (corrected) congenital malformations of heart and circulatory system: Secondary | ICD-10-CM

## 2022-05-23 DIAGNOSIS — I959 Hypotension, unspecified: Secondary | ICD-10-CM | POA: Diagnosis not present

## 2022-05-23 DIAGNOSIS — I2489 Other forms of acute ischemic heart disease: Secondary | ICD-10-CM | POA: Diagnosis not present

## 2022-05-23 DIAGNOSIS — E876 Hypokalemia: Secondary | ICD-10-CM | POA: Diagnosis present

## 2022-05-23 DIAGNOSIS — C61 Malignant neoplasm of prostate: Secondary | ICD-10-CM | POA: Diagnosis present

## 2022-05-23 DIAGNOSIS — Z6834 Body mass index (BMI) 34.0-34.9, adult: Secondary | ICD-10-CM

## 2022-05-23 DIAGNOSIS — E669 Obesity, unspecified: Secondary | ICD-10-CM | POA: Diagnosis present

## 2022-05-23 DIAGNOSIS — I5082 Biventricular heart failure: Secondary | ICD-10-CM | POA: Diagnosis present

## 2022-05-23 DIAGNOSIS — R0989 Other specified symptoms and signs involving the circulatory and respiratory systems: Secondary | ICD-10-CM | POA: Diagnosis not present

## 2022-05-23 DIAGNOSIS — I5033 Acute on chronic diastolic (congestive) heart failure: Secondary | ICD-10-CM | POA: Diagnosis present

## 2022-05-23 DIAGNOSIS — R000 Tachycardia, unspecified: Secondary | ICD-10-CM | POA: Diagnosis not present

## 2022-05-23 LAB — CBC
HCT: 31.6 % — ABNORMAL LOW (ref 39.0–52.0)
Hemoglobin: 10 g/dL — ABNORMAL LOW (ref 13.0–17.0)
MCH: 25.8 pg — ABNORMAL LOW (ref 26.0–34.0)
MCHC: 31.6 g/dL (ref 30.0–36.0)
MCV: 81.7 fL (ref 80.0–100.0)
Platelets: 156 10*3/uL (ref 150–400)
RBC: 3.87 MIL/uL — ABNORMAL LOW (ref 4.22–5.81)
RDW: 16.4 % — ABNORMAL HIGH (ref 11.5–15.5)
WBC: 10.7 10*3/uL — ABNORMAL HIGH (ref 4.0–10.5)
nRBC: 0 % (ref 0.0–0.2)

## 2022-05-23 LAB — PROTIME-INR
INR: 3.2 — ABNORMAL HIGH (ref 0.8–1.2)
Prothrombin Time: 32.7 seconds — ABNORMAL HIGH (ref 11.4–15.2)

## 2022-05-23 LAB — BASIC METABOLIC PANEL
Anion gap: 10 (ref 5–15)
BUN: 11 mg/dL (ref 8–23)
CO2: 21 mmol/L — ABNORMAL LOW (ref 22–32)
Calcium: 8 mg/dL — ABNORMAL LOW (ref 8.9–10.3)
Chloride: 102 mmol/L (ref 98–111)
Creatinine, Ser: 1.22 mg/dL (ref 0.61–1.24)
GFR, Estimated: >60 mL/min (ref >60)
Glucose, Bld: 128 mg/dL — ABNORMAL HIGH (ref 70–99)
Potassium: 3.4 mmol/L — ABNORMAL LOW (ref 3.5–5.1)
Sodium: 133 mmol/L — ABNORMAL LOW (ref 135–145)

## 2022-05-23 LAB — MAGNESIUM: Magnesium: 1.9 mg/dL (ref 1.7–2.4)

## 2022-05-23 LAB — ECHOCARDIOGRAM COMPLETE
AV Mean grad: 7 mmHg
AV Peak grad: 12.7 mmHg
Ao pk vel: 1.78 m/s
S' Lateral: 3.2 cm

## 2022-05-23 LAB — TROPONIN I (HIGH SENSITIVITY)
Troponin I (High Sensitivity): 185 ng/L (ref <18)
Troponin I (High Sensitivity): 178 ng/L (ref <18)

## 2022-05-23 LAB — BRAIN NATRIURETIC PEPTIDE: B Natriuretic Peptide: 312.7 pg/mL — ABNORMAL HIGH (ref 0.0–100.0)

## 2022-05-23 LAB — APTT: aPTT: 53 seconds — ABNORMAL HIGH (ref 24–36)

## 2022-05-23 MED ORDER — ACETAMINOPHEN 325 MG PO TABS: 650.0000 mg | ORAL_TABLET | Freq: Four times a day (QID) | ORAL | Status: DC | PRN

## 2022-05-23 MED ORDER — ATORVASTATIN CALCIUM 10 MG PO TABS
20.0000 mg | ORAL_TABLET | Freq: Every day | ORAL | Status: DC
  Administered 2022-05-24 – 2022-05-26 (×3): 20 mg via ORAL
  Filled 2022-05-23 (×3): qty 2

## 2022-05-23 MED ORDER — POTASSIUM CHLORIDE CRYS ER 20 MEQ PO TBCR
40.0000 meq | EXTENDED_RELEASE_TABLET | Freq: Once | ORAL | Status: AC
  Administered 2022-05-23: 40 meq via ORAL
  Filled 2022-05-23: qty 2

## 2022-05-23 MED ORDER — ACETAMINOPHEN 650 MG RE SUPP: 650.0000 mg | Freq: Four times a day (QID) | RECTAL | Status: DC | PRN

## 2022-05-23 MED ORDER — POLYETHYLENE GLYCOL 3350 17 G PO PACK: 17.0000 g | PACK | Freq: Every day | ORAL | Status: DC | PRN

## 2022-05-23 MED ORDER — PERFLUTREN LIPID MICROSPHERE
1.0000 mL | INTRAVENOUS | Status: AC | PRN
  Administered 2022-05-23: 2 mL via INTRAVENOUS

## 2022-05-23 MED ORDER — METOPROLOL TARTRATE 25 MG PO TABS
25.0000 mg | ORAL_TABLET | Freq: Two times a day (BID) | ORAL | Status: DC
  Administered 2022-05-23 – 2022-05-26 (×6): 25 mg via ORAL
  Filled 2022-05-23 (×6): qty 1

## 2022-05-23 MED ORDER — SODIUM CHLORIDE 0.9% FLUSH
3.0000 mL | Freq: Two times a day (BID) | INTRAVENOUS | Status: DC
  Administered 2022-05-23 – 2022-05-26 (×5): 3 mL via INTRAVENOUS

## 2022-05-23 NOTE — ED Provider Notes (Signed)
[place] EMERGENCY DEPARTMENT AT [facility] Provider Note   CSN: [id number] Arrival date & time: 05/23/22  1101     History  Chief Complaint  Patient presents with   Shortness of Breath    [name] is a 72 y.o. male with history of CAD/CABG, aortic valve placement, mitral valve replacement, hyperlipidemia, GERD, obesity.  Patient brought in by EMS today for evaluation of exertional dyspnea ongoing for the past 3 days whenever patient is walking around he reports that he feels short of breath, he reports this resolves quickly with rest.  He denies any associated chest pain.  He denies similar issues in the past.  He denies chest pain, fever, chills, cough/hemoptysis, extremity swelling/color change, abdominal pain, vomiting, diarrhea.  Of note patient does report some intermittent left knee pain for the past few weeks he reports this was evaluated by his primary care provider and he had a negative DVT study recently.  He reports that his primary care provider drew some fluid off of his left knee last week and reports that did not show any signs of gout or infection.  HPI     Home Medications Prior to Admission medications   Medication Sig Start Date End Date Taking? Authorizing Provider  amoxicillin (AMOXIL) 500 MG capsule TAKE FOUR CAPSULES BY MOUTH ONE HOUR PRIOR TO DENTAL APPOINTMENT Patient not taking: Reported on 05/18/2022 05/11/22   Kathleene Hazel, MD  atorvastatin (LIPITOR) 20 MG tablet Take 1 tablet (20 mg total) by mouth daily. 05/18/22   [name], MD  calcium carbonate (TUMS - DOSED IN MG ELEMENTAL CALCIUM) 500 MG chewable tablet Chew 1 tablet by mouth as needed for indigestion or heartburn.    [provider]  metoprolol tartrate (LOPRESSOR) 25 MG tablet TAKE 1 TABLET BY MOUTH 2 TIMES A DAY 10/22/21   Kathleene Hazel, MD  oxymetazoline (AFRIN) 0.05 % nasal spray Place 1 spray into both nostrils at bedtime as needed for  congestion.    [provider]  warfarin (COUMADIN) 7.5 MG tablet TAKE 1 TABLET BY MOUTH EVERY DAY 12/28/21   Kathleene Hazel, MD      Allergies    Patient has no known allergies.    Review of Systems   Review of Systems Ten systems are reviewed and are negative for acute change except as noted in the HPI  Physical Exam Updated Vital Signs BP 110/77   Pulse 97   Temp 97.9 F (36.6 C) (Oral)   Resp 19   SpO2 97%  Physical Exam Constitutional:      General: He is not in acute distress.    Appearance: Normal appearance. He is well-developed. He is not ill-appearing or diaphoretic.  HENT:     Head: Normocephalic and atraumatic.  Eyes:     General: Vision grossly intact. Gaze aligned appropriately.     Pupils: Pupils are equal, round, and reactive to light.  Neck:     Trachea: Trachea and phonation normal.  Cardiovascular:     Rate and Rhythm: Normal rate and regular rhythm.  Pulmonary:     Effort: Pulmonary effort is normal. No respiratory distress.     Breath sounds: Normal breath sounds.  Abdominal:     General: There is no distension.     Palpations: Abdomen is soft.     Tenderness: There is no abdominal tenderness. There is no guarding or rebound.  Musculoskeletal:        General: Normal range  of motion.     Cervical back: Normal range of motion.     Right lower leg: No tenderness. No edema.     Left lower leg: No tenderness. No edema.     Comments: Right knee: Small effusion.  Evidence for recent aspiration.  No erythema or increased warmth.  No significant tenderness.  AROM 0-80 degrees.  Neurovascular tact distally.  No swelling or tenderness of the calf.  Skin:    General: Skin is warm and dry.  Neurological:     Mental Status: He is alert.     GCS: GCS eye subscore is 4. GCS verbal subscore is 5. GCS motor subscore is 6.     Comments: Speech is clear and goal oriented, follows commands Major Cranial nerves without deficit, no facial droop Moves  extremities without ataxia, coordination intact  Psychiatric:        Behavior: Behavior normal.     ED Results / Procedures / Treatments   Labs (all labs ordered are listed, but only abnormal results are displayed) Labs Reviewed  BASIC METABOLIC PANEL - Abnormal; Notable for the following components:      Result Value   Sodium 133 (*)    Potassium 3.4 (*)    CO2 21 (*)    Glucose, Bld 128 (*)    Calcium 8.0 (*)    All other components within normal limits  CBC - Abnormal; Notable for the following components:   WBC 10.7 (*)    RBC 3.87 (*)    Hemoglobin 10.0 (*)    HCT 31.6 (*)    MCH 25.8 (*)    RDW 16.4 (*)    All other components within normal limits  APTT - Abnormal; Notable for the following components:   aPTT 53 (*)    All other components within normal limits  PROTIME-INR - Abnormal; Notable for the following components:   Prothrombin Time 32.7 (*)    INR 3.2 (*)    All other components within normal limits  BRAIN NATRIURETIC PEPTIDE - Abnormal; Notable for the following components:   B Natriuretic Peptide 312.7 (*)    All other components within normal limits  TROPONIN I (HIGH SENSITIVITY) - Abnormal; Notable for the following components:   Troponin I (High Sensitivity) 185 (*)    All other components within normal limits  TROPONIN I (HIGH SENSITIVITY) - Abnormal; Notable for the following components:   Troponin I (High Sensitivity) 178 (*)    All other components within normal limits  MAGNESIUM    EKG EKG Interpretation  Date/Time:  Sunday May 23 2022 11:19:05 EDT Ventricular Rate:  67 PR Interval:  146 QRS Duration: 149 QT Interval:  489 QTC Calculation: 517 R Axis:   -66 Text Interpretation: V paced rhythm Confirmed by Alvester Chou [phone]) on 05/23/2022 11:43:16 AM  Radiology DG Chest Port 1 View  Result Date: 05/23/2022 CLINICAL DATA:  Dyspnea on exertion. EXAM: PORTABLE CHEST 1 VIEW COMPARISON:  May 29, 2019. FINDINGS: Stable  cardiomediastinal silhouette. Lungs are clear. Left-sided pacemaker is unchanged. Sternotomy wires are noted. Bony thorax is unremarkable. IMPRESSION: No active disease. Electronically Signed   By: Lupita Raider M.D.   On: 05/23/2022 11:52    Procedures Procedures    Medications Ordered in ED Medications  atorvastatin (LIPITOR) tablet 20 mg (has no administration in time range)  metoprolol tartrate (LOPRESSOR) tablet 25 mg (has no administration in time range)  sodium chloride flush (NS) 0.9 % injection 3 mL (3 mLs  Intravenous Given 05/23/22 1502)  acetaminophen (TYLENOL) tablet 650 mg (has no administration in time range)    Or  acetaminophen (TYLENOL) suppository 650 mg (has no administration in time range)  polyethylene glycol (MIRALAX / GLYCOLAX) packet 17 g (has no administration in time range)  potassium chloride SA (KLOR-CON M) CR tablet 40 mEq (40 mEq Oral Given 05/23/22 1501)    ED Course/ Medical Decision Making/ A&P Clinical Course as of 05/23/22 1502  Sun May 23, 2022  1142 Interrogate [BM]  1308 Dr. Shari Prows [BM]  1450 Dr. Alinda Money [BM]    Clinical Course User Index [BM] Elizabeth Palau                             Medical Decision Making 72 year old male with exertional dyspnea ongoing for 3 days.  No associated chest pain.  No infectious symptoms.  He does note some intermittent left knee pain for some time which was previously evaluated by his primary care provider.  Patient reports negative DVT study by his PCP and a left knee aspiration which was negative for gout or infection.  On evaluation patient is in no acute distress.  Lung sounds clear.  He has a small left knee effusion without evidence for infection.  No tenderness or swelling of the calves.  No additional concerns at this time.  Differential includes but not limited to ACS, CHF exacerbation, arrhythmia, viral illness, PE, PNA.  Will obtain cardiac workup.  Amount and/or Complexity of Data  Reviewed Labs: ordered.    Details: CBC shows mild leukocytosis of 10.7 somewhat improved from 2 weeks ago.  Anemia of 10.0.  No thrombocytopenia. INR elevated at 3.2. BMP shows mild hyponatremia and hypokalemia.  No AKI.  No gap. Troponin elevated at 185, no priors to compare. BNP 312, no priors to compare. Radiology: ordered.    Details: I personally reviewed and interpreted patient's 1 view chest x-ray.  I do not appreciate any obvious PTX, PNA or effusion. ECG/medicine tests:     Details: EKG shows ventricular paced rhythm.  Risk Decision regarding hospitalization. Risk Details: Patient reassessed he is resting company bed no acute distress.  Initial troponin is elevated, consult called to cardiology.  I discussed the case with Dr. Shari Prows who then saw evaluate the patient and asked that I consult medicine for admission.  I then spoke with Dr. Alinda Money who agreed to admit patient.  Patient was seen and evaluated by attending physician Dr. Renaye Rakers who agrees with plan.   Note: Portions of this report may have been transcribed using voice recognition software. Every effort was made to ensure accuracy; however, inadvertent computerized transcription errors may still be present.         Final Clinical Impression(s) / ED Diagnoses Final diagnoses:  Dyspnea on exertion    Rx / DC Orders ED Discharge Orders     None         Elizabeth Palau 05/23/22 1502    Terald Sleeper, MD 05/23/22 [id number]

## 2022-05-23 NOTE — Consult Note (Signed)
Cardiology Consultation   Patient ID: PEREGRINE NOLT MRN: [id number]; DOB: 05-09-1950  Admit date: 05/23/2022 Date of Consult: 05/23/2022  PCP:  [name], MD   [facility] Providers Cardiologist:  Verne Carrow, MD      Patient Profile:   [name] is a 72 y.o. male with a hx of CAD s/p 2V CABG in 2020, aortic stenosis s/p AVR in 2020, mitral stenosis s/p MVR in 2020, symptomatic bradycardia s/p pacemaker placement, atrial flutter, tobacco abuse, hyperlipidemia, prostate cancer and esophageal stricture  who is being seen 05/23/2022 for the evaluation of DOE at the request of Dr. Renaye Rakers.  History of Present Illness:   Mr. Goldring is a 72 year old male with cardiac history as detailed above who follows with Dr. Clifton James as outpatient. Per review of the record, the patient had progression of AS and MS and underwent pre-op cath in 09/2018 which revealed CTO of RCA and severe stenosis of LAD. He underwent 2V CABG with LIMA-LAD and SVG-RCA at time of surgical bioprosthetic AVR and MVR. Also had LAA clipping and PFO closure at that time. Post-op course was complicated by bradycardia and aflutter and he underwent PPM placement. Was last seen in clinic by Dr. Clifton James on  10/2021 where he was doing well from a CV standpoint.  Patient presented to the ER today with 3 day history of worsening shortness of breath, fatigue and nausea. In the ER, patient HD stable with HR 90-100s. SpO2 normal at 97% on RA. Labs notable for BNP 312.7. Trop 185. INR 3.2. CXR with no acute pathology. ECG with intermittent a-pacing, v-pacing with PVCs. Cardiology asked ot consult for further work-up of DOE and elevated trop.  Currently, the patient states he feels more comfortable. States that over the past month, he has been declining with weight loss and poor PO intake. He is planned for a GI work-up with a colonoscopy as an outpatient. Over the past 3 days, however, he noted worsening dyspnea,  nausea and an episode where "lost all his color" with generalized malaise prompting him to go to the ER for further evaluation.   Past Medical History:  Diagnosis Date   BCE (basal cell epithelioma), arm    RIGHT SHOULDER   Bradycardia    Bumps on skin    on left side of nose for last 3 weeks   CAD (coronary artery disease)    a. s/p CABGx2 10/11/18 LIMA to LAD, SVG to RCA, EVH via right thigh.   Dyslipidemia    Ectatic thoracic aorta    Esophageal stricture    GERD (gastroesophageal reflux disease)    Heart murmur    sees dr Rose Fillers   Hyperlipidemia    Incidental pulmonary nodule, > 3mm and < 8mm 10/04/2018   Noted on CTA   Inguinal hernia    bilateral   Obesity    Presence of permanent cardiac pacemaker    Prostate cancer    S/P aortic valve replacement with bioprosthetic valve 10/11/2018   23 mm Edwards Inspiris Resilia stented bovine pericardial tissue valve, miltral valve done also   S/P CABG x 2 10/11/2018   LIMA to LAD, SVG to RCA, EVH via right thigh   S/P mitral valve replacement with bioprosthetic valve 10/11/2018   27 mm Medtronic Mosaic stented porcine bioprosthetic tissue valve   S/P patent foramen ovale closure 10/11/2018   S/P placement of cardiac pacemaker    Smoker     Past Surgical History:  Procedure Laterality Date   AORTIC VALVE REPLACEMENT N/A 10/11/2018   Procedure: AORTIC VALVE REPLACEMENT (AVR) with 23 Inspiris Bioprosthetic Aortic valve.;  Surgeon: Purcell Nails, MD;  Location: [facility] OR;  Service: Open Heart Surgery;  Laterality: N/A;   CLIPPING OF ATRIAL APPENDAGE N/A 10/11/2018   Procedure: CLIPPING OF LEFT ATRIAL APPENDAGE with 45 AtriCure Clip.;  Surgeon: Purcell Nails, MD;  Location: [facility] OR;  Service: Open Heart Surgery;  Laterality: N/A;   COLONOSCOPY  03/2008,2004   Dr. Madilyn Fireman   CORONARY ARTERY BYPASS GRAFT N/A 10/11/2018   Procedure: CORONARY ARTERY BYPASS GRAFTING (CABG) x 2 using LIMA to the LAD and endoscopic greater saphenous vein harvesting  to the RCA.;  Surgeon: Purcell Nails, MD;  Location: [facility] OR;  Service: Open Heart Surgery;  Laterality: N/A;   CYSTOSCOPY  04/04/2019   Procedure: CYSTOSCOPY;  Surgeon: Rene Paci, MD;  Location: [facility];  Service: Urology;;   HERNIA REPAIR  12/11/10   BIH   MITRAL VALVE REPLACEMENT N/A 10/11/2018   Procedure: MITRAL VALVE (MV) REPLACEMENT with 27 Mosaic Bioprosthetic Mitral valve.;  Surgeon: Purcell Nails, MD;  Location: [facility] OR;  Service: Open Heart Surgery;  Laterality: N/A;   PACEMAKER IMPLANT N/A 10/17/2018   Procedure: PACEMAKER IMPLANT;  Surgeon: Hillis Range, MD;  Location: [facility] INVASIVE CV LAB;  Service: Cardiovascular;  Laterality: N/A;   RADIOACTIVE SEED IMPLANT N/A 04/04/2019   Procedure: RADIOACTIVE SEED IMPLANT/BRACHYTHERAPY IMPLANT, cystoscopy;  Surgeon: Rene Paci, MD;  Location: [facility];  Service: Urology;  Laterality: N/A;  58 seeds   REPAIR OF PATENT FORAMEN OVALE N/A 10/11/2018   Procedure: CLOSURE OF PATENT FORAMEN OVALE;  Surgeon: Purcell Nails, MD;  Location: [facility] OR;  Service: Open Heart Surgery;  Laterality: N/A;   RIGHT/LEFT HEART CATH AND CORONARY ANGIOGRAPHY N/A 09/15/2018   Procedure: RIGHT/LEFT HEART CATH AND CORONARY ANGIOGRAPHY;  Surgeon: Kathleene Hazel, MD;  Location: [facility] INVASIVE CV LAB;  Service: Cardiovascular;  Laterality: N/A;   TEE WITHOUT CARDIOVERSION N/A 12/12/2015   Procedure: TRANSESOPHAGEAL ECHOCARDIOGRAM (TEE);  Surgeon: Thurmon Fair, MD;  Location: [facility] ENDOSCOPY;  Service: Cardiovascular;  Laterality: N/A;   TEE WITHOUT CARDIOVERSION N/A 10/11/2018   Procedure: TRANSESOPHAGEAL ECHOCARDIOGRAM (TEE);  Surgeon: Purcell Nails, MD;  Location: [facility] OR;  Service: Open Heart Surgery;  Laterality: N/A;   THYMECTOMY  1976   radaiation tx done     Home Medications:  Prior to Admission medications   Medication Sig Start Date End Date Taking? Authorizing Provider  amoxicillin (AMOXIL) 500 MG  capsule TAKE FOUR CAPSULES BY MOUTH ONE HOUR PRIOR TO DENTAL APPOINTMENT Patient not taking: Reported on 05/18/2022 05/11/22   Kathleene Hazel, MD  atorvastatin (LIPITOR) 20 MG tablet Take 1 tablet (20 mg total) by mouth daily. 05/18/22   [name], MD  calcium carbonate (TUMS - DOSED IN MG ELEMENTAL CALCIUM) 500 MG chewable tablet Chew 1 tablet by mouth as needed for indigestion or heartburn.    [provider]  metoprolol tartrate (LOPRESSOR) 25 MG tablet TAKE 1 TABLET BY MOUTH 2 TIMES A DAY 10/22/21   Kathleene Hazel, MD  oxymetazoline (AFRIN) 0.05 % nasal spray Place 1 spray into both nostrils at bedtime as needed for congestion.    [provider]  warfarin (COUMADIN) 7.5 MG tablet TAKE 1 TABLET BY MOUTH EVERY DAY 12/28/21   Kathleene Hazel, MD    Inpatient Medications: Scheduled Meds:  Continuous Infusions:  PRN Meds:   Allergies:   No Known Allergies  Social History:   Social History   Socioeconomic History   Marital status: Married    Spouse name: Not on file   Number of children: 0   Years of education: Not on file   Highest education level: Not on file  Occupational History   Occupation: Market researcher: [employer]  Tobacco Use   Smoking status: Former    Packs/day: 0.25    Years: 35.00    Additional pack years: 0.00    Total pack years: 8.75    Types: Cigarettes    Quit date: 09/04/2018    Years since quitting: 3.7   Smokeless tobacco: Never  Vaping Use   Vaping Use: Never used  Substance and Sexual Activity   Alcohol use: Yes    Alcohol/week: 1.0 standard drink of alcohol    Types: 1 Cans of beer per week    Comment: rare   Drug use: No   Sexual activity: Yes  Other Topics Concern   Not on file  Social History Narrative   Not on file   Social Determinants of Health   Financial Resource Strain: Low Risk  (05/11/2022)   Overall Financial Resource Strain (CARDIA)    Difficulty of Paying Living Expenses:  Not hard at all  Food Insecurity: No Food Insecurity (05/23/2022)   Hunger Vital Sign    Worried About Running Out of Food in the Last Year: Never true    Ran Out of Food in the Last Year: Never true  Transportation Needs: No Transportation Needs (05/23/2022)   PRAPARE - Administrator, Civil Service (Medical): No    Lack of Transportation (Non-Medical): No  Physical Activity: Inactive (05/11/2022)   Exercise Vital Sign    Days of Exercise per Week: 0 days    Minutes of Exercise per Session: 0 min  Stress: No Stress Concern Present (05/11/2022)   [employer] of Occupational Health - Occupational Stress Questionnaire    Feeling of Stress : Not at all  Social Connections: Not on file  Intimate Partner Violence: Not At Risk (05/23/2022)   Humiliation, Afraid, Rape, and Kick questionnaire    Fear of Current or Ex-Partner: No    Emotionally Abused: No    Physically Abused: No    Sexually Abused: No    Family History:   Family History  Problem Relation Age of Onset   Colon cancer Mother    Lung cancer Father        smoker   CAD Neg Hx    Pancreatic cancer Neg Hx    Breast cancer Neg Hx      ROS:  Please see the history of present illness.  All other ROS reviewed and negative.     Physical Exam/Data:   Vitals:   05/23/22 1300 05/23/22 1315 05/23/22 1330 05/23/22 1345  BP: 103/69 102/74 99/78 110/77  Pulse: 95 94 97 97  Resp: Temp:      TempSrc:      SpO2: 98% 97% 95% 97%   No intake or output data in the 24 hours ending 05/23/22 1433    05/18/2022   10:15 AM 05/12/2022    9:39 AM 05/11/2022    3:11 PM  Last 3 Weights  Weight (lbs) 223 lb 220 lb 9.6 oz 224 lb 12.8 oz  Weight (kg) 101.152 kg 100.064 kg 101.969 kg     There is no  height or weight on file to calculate BMI.  General:  Well nourished, well developed, in no acute distress HEENT: normal Neck: no JVD Vascular: No carotid bruits; Distal pulses 2+ bilaterally Cardiac:  irregular, 2/6  systolic murmur at RUSB and LUSB Lungs:  clear to auscultation bilaterally, no wheezing, rhonchi or rales  Abd: soft, nontender, no hepatomegaly  Ext: no edema Musculoskeletal:  No deformities, BUE and BLE strength normal and equal Skin: warm and dry  Neuro:  CNs 2-12 intact, no focal abnormalities noted Psych:  Normal affect   EKG:  The EKG was personally reviewed and demonstrates:  Intermittent a-pacing and v-pacing with PVCs Telemetry:  Telemetry was personally reviewed and demonstrates:  Intermittent a-pacing, v-pacing, sinus and frequent PVCs  Relevant CV Studies: TTE 06/2019: IMPRESSIONS     1. Left ventricular ejection fraction, by estimation, is 60 to 65%. The  left ventricle has normal function. The left ventricle has no regional  wall motion abnormalities. There is mild concentric left ventricular  hypertrophy. Left ventricular diastolic  function could not be evaluated.   2. Right ventricular systolic function is moderately reduced. The right  ventricular size is moderately enlarged. There is normal pulmonary artery  systolic pressure.   3. Left atrial size was mildly dilated.   4. Right atrial size was mildly dilated.   5. The mitral valve has been repaired/replaced. No evidence of mitral  valve regurgitation. There is a 27 mm Medtronic bioprosthetic valve  present in the mitral position. Procedure Date: 10/11/2018. Echo findings  are consistent with normal structure and  function of the mitral valve prosthesis.   6. The tricuspid valve is abnormal. Tricuspid valve regurgitation is  moderate.   7. The aortic valve has been repaired/replaced. Aortic valve  regurgitation is not visualized. There is a 23 mm Edwards bovine valve  present in the aortic position. Procedure Date: 10/11/2018. Echo findings  are consistent with normal structure and function   of the aortic valve prosthesis.   8. The inferior vena cava is normal in size with <50% respiratory  variability,  suggesting right atrial pressure of 8 mmHg.   Conclusion(s)/Recommendation(s): S/P bioprosthetic AVR and MVR. Valves  appear well seated, without significant regurgitation. No other echo post  op to compare to.   Laboratory Data:  High Sensitivity Troponin:   Recent Labs  Lab 05/23/22 1126  TROPONINIHS 185*     Chemistry Recent Labs  Lab 05/23/22 1126  NA 133*  K 3.4*  CL 102  CO2 21*  GLUCOSE 128*  BUN 11  CREATININE 1.22  CALCIUM 8.0*  GFRNONAA >60  ANIONGAP 10    No results for input(s): "PROT", "ALBUMIN", "AST", "ALT", "ALKPHOS", "BILITOT" in the last 168 hours. Lipids No results for input(s): "CHOL", "TRIG", "HDL", "LABVLDL", "LDLCALC", "CHOLHDL" in the last 168 hours.  Hematology Recent Labs  Lab 05/23/22 1126  WBC 10.7*  RBC 3.87*  HGB 10.0*  HCT 31.6*  MCV 81.7  MCH 25.8*  MCHC 31.6  RDW 16.4*  PLT 156   Thyroid No results for input(s): "TSH", "FREET4" in the last 168 hours.  BNP Recent Labs  Lab 05/23/22 1126  BNP 312.7*    DDimer No results for input(s): "DDIMER" in the last 168 hours.   Radiology/Studies:  DG Chest Port 1 View  Result Date: 05/23/2022 CLINICAL DATA:  Dyspnea on exertion. EXAM: PORTABLE CHEST 1 VIEW COMPARISON:  May 29, 2019. FINDINGS: Stable cardiomediastinal silhouette. Lungs are clear. Left-sided pacemaker is unchanged. Sternotomy wires are  noted. Bony thorax is unremarkable. IMPRESSION: No active disease. Electronically Signed   By: Lupita Raider M.D.   On: 05/23/2022 11:52     Assessment and Plan:   #DOE: #Generalized Malaise: #Known CAD with history of CABG x2: -Patient with history of CAD s/p LIMA-LAD and SVG-RCA in 2020 at time of AVR and MVR -Now presenting with progressive dyspnea on exertion over the past 3 days with trop 186 with repeat pending -ECG without acute ischemic change -Overall, unclear etiology of symptoms given progressive weight loss, poor po intake and nausea, however dyspnea on exertion  concerning for possible cardiac etiology -Will  follow-up TTE and repeat trop to help guide further management -Hold home warfarin with plans to start heparin once INR<2 in case need for further procedures  #History of severe AS and MS s/p AVR/MVR: -S/p bioprosthetic AVR and MVR for severe AS and MS in 2020 -Normal functioning MVR  and AVR on TTE in 2021 -On warfarin for [facility]; hold for heparin gtt for now in case need for further procedures -Check repeat TTE as above  #CHB: -S/p PPM placement -Follows with EP as outpatient  #Aflutter: -In NSR/v-paced rhythm today -Holding warfarin as above  #Frequent PVCs: -Noted on tele -Resume home metop -Replete K and add on Mg to labs -Follow-up TTE as above  #Weight Loss: -Unclear etiology but has been ongoing for the past two months -Undergoing work-up as outpatient and is planned for colonoscopy -Further work-up per primary   Risk Assessment/Risk Scores:        CHA2DS2-VASc Score = 3  This indicates a 3.2% annual risk of stroke. The patient's score is based upon: CHF History: 0 HTN History: 1 Diabetes History: 0 Stroke History: 0 Vascular Disease History: 1 Age Score: 1 Gender Score: 0         For questions or updates, please contact [facility] Please consult [email or web address] for contact info under    Signed, Meriam Sprague, MD  05/23/2022 2:33 PM

## 2022-05-23 NOTE — ED Notes (Signed)
Interrogated Pacemaker 

## 2022-05-23 NOTE — H&P (Addendum)
History and Physical   SHYQUAN STALLBAUMER VWU:[id number] DOB: July 25, 1950 DOA: 05/23/2022  PCP: [name], MD   Patient coming from: Home  Chief Complaint: Dyspnea on exertion  HPI: [name] is a 72 y.o. male with medical history significant of CAD status post CABG x 2, status post left atrial appendage clipping, status post foramen ovale closure, status post aortic and mitral valve replacement with bioprosthetic, hyperlipidemia, complete heart block status post pacemaker, atrial flutter, obesity, prostate cancer presenting with dyspnea on exertion.  Patient reports about 3 days of worsening dyspnea on exertion.  Reports associated nausea and fatigue as well.  He also has had about a month of generalized weakness and decreased p.o. intake with weight loss.  Has an outpatient workup planned for this with GI to do a colonoscopy.  Reports some darker brown stools at times but denies any black stools no bloody stools and his most recent stools have been normal.  Denies fevers, chills, chest pain, abdominal pain, constipation, diarrhea.    ED Course: Vital signs in the ED notable for heart rate in the 90s to 100s, respiratory rate in the teens to 20s.  Lab workup included BMP with sodium 133, potassium 3.4, bicarb 21, glucose 120, calcium 8.0.  CBC with borderline leukocytosis at 10.5 and hemoglobin of 10 near baseline of 11-12 in the last couple weeks.  PT and INR elevated at 32.7-3.2 respectively on warfarin.  PTT 53.  Troponin elevated to 185 with repeat pending.  BNP borderline at 312.  Magnesium pending.  Chest x-ray showed no acute Gershon Mussel.  Patient received 40 mill equivalents p.o. potassium in the ED.  Cardiology consulted and recommended echocardiogram, troponin, holding warfarin in favor of heparin when INR less than 2 if procedure is needed.  They will consult.  Review of Systems: As per HPI otherwise all other systems reviewed and are negative.  Past Medical History:  Diagnosis  Date   BCE (basal cell epithelioma), arm    RIGHT SHOULDER   Bradycardia    Bumps on skin    on left side of nose for last 3 weeks   CAD (coronary artery disease)    a. s/p CABGx2 10/11/18 LIMA to LAD, SVG to RCA, EVH via right thigh.   Dyslipidemia    Ectatic thoracic aorta    Esophageal stricture    GERD (gastroesophageal reflux disease)    Heart murmur    sees dr Rose Fillers   Hyperlipidemia    Incidental pulmonary nodule, > 3mm and < 8mm 10/04/2018   Noted on CTA   Inguinal hernia    bilateral   Obesity    Presence of permanent cardiac pacemaker    Prostate cancer    S/P aortic valve replacement with bioprosthetic valve 10/11/2018   23 mm Edwards Inspiris Resilia stented bovine pericardial tissue valve, miltral valve done also   S/P CABG x 2 10/11/2018   LIMA to LAD, SVG to RCA, EVH via right thigh   S/P mitral valve replacement with bioprosthetic valve 10/11/2018   27 mm Medtronic Mosaic stented porcine bioprosthetic tissue valve   S/P patent foramen ovale closure 10/11/2018   S/P placement of cardiac pacemaker    Smoker     Past Surgical History:  Procedure Laterality Date   AORTIC VALVE REPLACEMENT N/A 10/11/2018   Procedure: AORTIC VALVE REPLACEMENT (AVR) with 23 Inspiris Bioprosthetic Aortic valve.;  Surgeon: Purcell Nails, MD;  Location: [facility] OR;  Service: Open Heart Surgery;  Laterality: N/A;  CLIPPING OF ATRIAL APPENDAGE N/A 10/11/2018   Procedure: CLIPPING OF LEFT ATRIAL APPENDAGE with 45 AtriCure Clip.;  Surgeon: Purcell Nails, MD;  Location: [facility] OR;  Service: Open Heart Surgery;  Laterality: N/A;   COLONOSCOPY  03/2008,2004   Dr. Madilyn Fireman   CORONARY ARTERY BYPASS GRAFT N/A 10/11/2018   Procedure: CORONARY ARTERY BYPASS GRAFTING (CABG) x 2 using LIMA to the LAD and endoscopic greater saphenous vein harvesting to the RCA.;  Surgeon: Purcell Nails, MD;  Location: [facility] OR;  Service: Open Heart Surgery;  Laterality: N/A;   CYSTOSCOPY  04/04/2019   Procedure: CYSTOSCOPY;   Surgeon: Rene Paci, MD;  Location: [facility];  Service: Urology;;   HERNIA REPAIR  12/11/10   BI[facility]   MITRAL VALVE REPLACEMENT N/A 10/11/2018   Procedure: MITRAL VALVE (MV) REPLACEMENT with 27 Mosaic Bioprosthetic Mitral valve.;  Surgeon: Purcell Nails, MD;  Location: [facility] OR;  Service: Open Heart Surgery;  Laterality: N/A;   PACEMAKER IMPLANT N/A 10/17/2018   Procedure: PACEMAKER IMPLANT;  Surgeon: Hillis Range, MD;  Location: [facility] INVASIVE CV LAB;  Service: Cardiovascular;  Laterality: N/A;   RADIOACTIVE SEED IMPLANT N/A 04/04/2019   Procedure: RADIOACTIVE SEED IMPLANT/BRACHYTHERAPY IMPLANT, cystoscopy;  Surgeon: Rene Paci, MD;  Location: [facility];  Service: Urology;  Laterality: N/A;  58 seeds   REPAIR OF PATENT FORAMEN OVALE N/A 10/11/2018   Procedure: CLOSURE OF PATENT FORAMEN OVALE;  Surgeon: Purcell Nails, MD;  Location: [facility] OR;  Service: Open Heart Surgery;  Laterality: N/A;   RIGHT/LEFT HEART CATH AND CORONARY ANGIOGRAPHY N/A 09/15/2018   Procedure: RIGHT/LEFT HEART CATH AND CORONARY ANGIOGRAPHY;  Surgeon: Kathleene Hazel, MD;  Location: [facility] INVASIVE CV LAB;  Service: Cardiovascular;  Laterality: N/A;   TEE WITHOUT CARDIOVERSION N/A 12/12/2015   Procedure: TRANSESOPHAGEAL ECHOCARDIOGRAM (TEE);  Surgeon: Thurmon Fair, MD;  Location[facility] ENDOSCOPY;  Service: Cardiovascular;  Laterality: N/A;   TEE WITHOUT CARDIOVERSION N/A 10/11/2018   Procedure: TRANSESOPHAGEAL ECHOCARDIOGRAM (TEE);  Surgeon: Purcell Nails, MD;  Location: [facility] OR;  Service: Open Heart Surgery;  Laterality: N/A;   THYMECTOMY  1976   radaiation tx done    Social History  reports that he quit smoking about 3 years ago. His smoking use included cigarettes. He has a 8.75 pack-year smoking history. He has never used smokeless tobacco. He reports current alcohol use of about 1.0 standard drink of alcohol per week. He reports that he does not use drugs.  No Known  Allergies  Family History  Problem Relation Age of Onset   Colon cancer Mother    Lung cancer Father        smoker   CAD Neg Hx    Pancreatic cancer Neg Hx    Breast cancer Neg Hx   Reviewed on admission  Prior to Admission medications   Medication Sig Start Date End Date Taking? Authorizing Provider  amoxicillin (AMOXIL) 500 MG capsule TAKE FOUR CAPSULES BY MOUTH ONE HOUR PRIOR TO DENTAL APPOINTMENT Patient not taking: Reported on 05/18/2022 05/11/22   Kathleene Hazel, MD  atorvastatin (LIPITOR) 20 MG tablet Take 1 tablet (20 mg total) by mouth daily. 05/18/22   [name], MD  calcium carbonate (TUMS - DOSED IN MG ELEMENTAL CALCIUM) 500 MG chewable tablet Chew 1 tablet by mouth as needed for indigestion or heartburn.    [provider]  metoprolol tartrate (LOPRESSOR) 25 MG tablet TAKE 1 TABLET BY MOUTH 2 TIMES A DAY 10/22/21  Kathleene Hazel, MD  oxymetazoline (AFRIN) 0.05 % nasal spray Place 1 spray into both nostrils at bedtime as needed for congestion.    [provider]  warfarin (COUMADIN) 7.5 MG tablet TAKE 1 TABLET BY MOUTH EVERY DAY 12/28/21   Kathleene Hazel, MD    Physical Exam: Vitals:   05/23/22 1300 05/23/22 1315 05/23/22 1330 05/23/22 1345  BP: 103/69 102/74 99/78 110/77  Pulse: 95 94 97 97  Resp: Temp:      TempSrc:      SpO2: 98% 97% 95% 97%    Physical Exam Constitutional:      General: He is not in acute distress.    Appearance: Normal appearance.  HENT:     Head: Normocephalic and atraumatic.     Mouth/Throat:     Mouth: Mucous membranes are moist.     Pharynx: Oropharynx is clear.  Eyes:     Extraocular Movements: Extraocular movements intact.     Pupils: Pupils are equal, round, and reactive to light.  Cardiovascular:     Rate and Rhythm: Normal rate. Rhythm irregular.     Pulses: Normal pulses.     Heart sounds: Normal heart sounds.  Pulmonary:     Effort: Pulmonary effort is normal. No  respiratory distress.     Breath sounds: Normal breath sounds.  Abdominal:     General: Bowel sounds are normal. There is no distension.     Palpations: Abdomen is soft.     Tenderness: There is no abdominal tenderness.  Musculoskeletal:        General: No swelling or deformity.  Skin:    General: Skin is warm and dry.  Neurological:     General: No focal deficit present.     Mental Status: Mental status is at baseline.    Labs on Admission: I have personally reviewed following labs and imaging studies  CBC: Recent Labs  Lab 05/23/22 1126  WBC 10.7*  HGB 10.0*  HCT 31.6*  MCV 81.7  PLT 156    Basic Metabolic Panel: Recent Labs  Lab 05/23/22 1126  NA 133*  K 3.4*  CL 102  CO2 21*  GLUCOSE 128*  BUN 11  CREATININE 1.22  CALCIUM 8.0*    GFR: Estimated Creatinine Clearance: 62.9 mL/min (by C-G formula based on SCr of 1.22 mg/dL).  Liver Function Tests: No results for input(s): "AST", "ALT", "ALKPHOS", "BILITOT", "PROT", "ALBUMIN" in the last 168 hours.  Urine analysis:    Component Value Date/Time   COLORURINE YELLOW 10/09/2018 1544   APPEARANCEUR CLEAR 10/09/2018 1544   LABSPEC 1.015 10/09/2018 1544   PHURINE 5.0 10/09/2018 1544   GLUCOSEU NEGATIVE 10/09/2018 1544   HGBUR NEGATIVE 10/09/2018 1544   BILIRUBINUR NEGATIVE 10/09/2018 1544   BILIRUBINUR neg 03/25/2015 1053   KETONESUR NEGATIVE 10/09/2018 1544   PROTEINUR NEGATIVE 10/09/2018 1544   UROBILINOGEN 4.0 03/25/2015 1053   NITRITE NEGATIVE 10/09/2018 1544   LEUKOCYTESUR NEGATIVE 10/09/2018 1544    Radiological Exams on Admission: DG Chest Port 1 View  Result Date: 05/23/2022 CLINICAL DATA:  Dyspnea on exertion. EXAM: PORTABLE CHEST 1 VIEW COMPARISON:  May 29, 2019. FINDINGS: Stable cardiomediastinal silhouette. Lungs are clear. Left-sided pacemaker is unchanged. Sternotomy wires are noted. Bony thorax is unremarkable. IMPRESSION: No active disease. Electronically Signed   By: Lupita Raider  M.D.   On: 05/23/2022 11:52    EKG: Independently reviewed.  Paced rhythm at 67 bpm.  Assessment/Plan Principal Problem:  NSTEMI (non-ST elevated myocardial infarction) Active Problems:   S/P patent foramen ovale closure   Obesity (BMI 30-39.9)   Mixed hyperlipidemia   Coronary artery disease involving native coronary artery of native heart without angina pectoris   S/P aortic + mitral valve replacement with bioprosthetic valves   S/P CABG x 2   Complete heart block   Pacemaker   Malignant neoplasm of prostate   Atrial flutter   Dyspnea on exertion NSTEMI ?Rule out CHF, ACS > Patient presenting with worsening dyspnea on exertion for the past several days.  Does have troponin elevated to 185 with repeat pending.  BNP borderline at 312. > Known history of CAD status post CABG x 2 - Appreciate cardiology recommendations - Monitor on telemetry - Trend troponin - Echocardiogram - Strict I's and O's, daily weights - Check magnesium - Trend renal function and electrolytes - Transition anticoagulation to heparin when INR less than 2 in case procedures needed - Continue metoprolol and atorvastatin  Hypokalemia > Potassium noted to be 3.4 in the ED.  40 mill equivalents p.o. potassium ordered. - Magnesium pending. - Trend renal function and electrolytes  Anemia Fatigue Weight loss > Hemoglobin 10 from recent baseline of 11-12.   > Some fatigue and weight loss in the setting of decreased p.o. intake recently.  Has GI workup pending outpatient with colonoscopy. > Reports some darker brown stools at times but denies any black stools no bloody stools and his most recent stools have been normal. - Continue to trend hemoglobin  Left knee effusion ST abnormality Left knee > Noted in ED. DVT study ordered. > I was called with preliminary result; negative for DVT but positive for pseudoaneurysm vs other. - CT L knee to further delineate.   Status post foramen ovale closure Status  post LAA clipping Status post bioprosthetic replacement of aortic and mitral valve > Procedures performed at the time of CABG. - Transitioning warfarin to heparin when INR less than 2 as above  Complete heart block Atrial flutter > Status post pacemaker.  Paced rhythm in the ED. - Continue home metoprolol - Transitioning warfarin to heparin as above  Hyperlipidemia - Continue atorvastatin  Obesity - Noted  History of prostate cancer - Noted  DVT prophylaxis: Warfarin, transitioning to heparin when INR less than 2. Code Status:   Full Family Communication:  Updated at bedside  Disposition Plan:   Patient is from:  Home  Anticipated DC to:  Home  Anticipated DC date:  1 to 3 days  Anticipated DC barriers: None  Consults called:  Cardiology Admission status:  Observation, telemetry  Severity of Illness: The appropriate patient status for this patient is OBSERVATION. Observation status is judged to be reasonable and necessary in order to provide the required intensity of service to ensure the patient's safety. The patient's presenting symptoms, physical exam findings, and initial radiographic and laboratory data in the context of their medical condition is felt to place them at decreased risk for further clinical deterioration. Furthermore, it is anticipated that the patient will be medically stable for discharge from the hospital within 2 midnights of admission.    Synetta Fail MD [facility]  How to contact the [facility] Attending or Consulting provider 7A - 7P or covering provider during after hours 7P -7A, for this patient?   Check the care team in [facility] and look for a) attending/consulting [facility] provider listed and b) the [facility] team listed Log into [email or web address] and use [facility]'s universal password  to access. If you do not have the password, please contact the hospital operator. Locate the [facility] provider you are looking for under [facility] and page to a number  that you can be directly reached. If you still have difficulty reaching the provider, please page the [facility] (Director on Call) for the Hospitalists listed on amion for assistance.  05/23/2022, 2:56 PM

## 2022-05-23 NOTE — ED Notes (Signed)
Patient transported to Ultrasound 

## 2022-05-23 NOTE — ED Triage Notes (Addendum)
Pt BIB EMS from home. Per EMS, pt has had increasing SOB and "turning [name]" over the last three days. Pt has extensive cardiac hx including valve replacements, bypass, and pacemaker in place. Pt denies CP or any other related symptoms. Pt experiences SOB with exertion. A/Ox4.

## 2022-05-23 NOTE — ED Notes (Signed)
ED TO INPATIENT HANDOFF REPORT  ED Nurse Name and Phone #:  Porfirio Mylar [phone]  S Name/Age/Gender [name] 72 y.o. male Room/Bed: 025C/025C  Code Status   Code Status: Full Code  Home/SNF/Other Home Patient oriented to: self, place, time, and situation Is this baseline? Yes   Triage Complete: Triage complete  Chief Complaint Demand ischemia [I24.89]  Triage Note Pt BIB EMS from home. Per EMS, pt has had increasing SOB and "turning Rowen" over the last three days. Pt has extensive cardiac hx including valve replacements, bypass, and pacemaker in place. Pt denies CP or any other related symptoms. Pt experiences SOB with exertion. A/Ox4.   Allergies No Known Allergies  Level of Care/Admitting Diagnosis ED Disposition     ED Disposition  Admit   Condition  --   Comment  Hospital Area: [facility] [[id number]]  Level of Care: Telemetry Cardiac [103]  May place patient in observation at [facility] or [facility] if equivalent level of care is available:: No  Covid Evaluation: Asymptomatic - no recent exposure (last 10 days) testing not required  Diagnosis: Demand ischemia [320520]  Admitting Physician: Synetta Fail [[id number]]  Attending Physician: Synetta Fail [[id number]]          B Medical/Surgery History Past Medical History:  Diagnosis Date   BCE (basal cell epithelioma), arm    RIGHT SHOULDER   Bradycardia    Bumps on skin    on left side of nose for last 3 weeks   CAD (coronary artery disease)    a. s/p CABGx2 10/11/18 LIMA to LAD, SVG to RCA, EVH via right thigh.   Dyslipidemia    Ectatic thoracic aorta    Esophageal stricture    GERD (gastroesophageal reflux disease)    Heart murmur    sees dr Rose Fillers   Hyperlipidemia    Incidental pulmonary nodule, > 3mm and < 8mm 10/04/2018   Noted on CTA   Inguinal hernia    bilateral   Obesity    Presence of permanent cardiac pacemaker    Prostate cancer    S/P aortic valve  replacement with bioprosthetic valve 10/11/2018   23 mm Edwards Inspiris Resilia stented bovine pericardial tissue valve, miltral valve done also   S/P CABG x 2 10/11/2018   LIMA to LAD, SVG to RCA, EVH via right thigh   S/P mitral valve replacement with bioprosthetic valve 10/11/2018   27 mm Medtronic Mosaic stented porcine bioprosthetic tissue valve   S/P patent foramen ovale closure 10/11/2018   S/P placement of cardiac pacemaker    Smoker    Past Surgical History:  Procedure Laterality Date   AORTIC VALVE REPLACEMENT N/A 10/11/2018   Procedure: AORTIC VALVE REPLACEMENT (AVR) with 23 Inspiris Bioprosthetic Aortic valve.;  Surgeon: Purcell Nails, MD;  Location: [facility] OR;  Service: Open Heart Surgery;  Laterality: N/A;   CLIPPING OF ATRIAL APPENDAGE N/A 10/11/2018   Procedure: CLIPPING OF LEFT ATRIAL APPENDAGE with 45 AtriCure Clip.;  Surgeon: Purcell Nails, MD;  Location: [facility] OR;  Service: Open Heart Surgery;  Laterality: N/A;   COLONOSCOPY  03/2008,2004   Dr. Madilyn Fireman   CORONARY ARTERY BYPASS GRAFT N/A 10/11/2018   Procedure: CORONARY ARTERY BYPASS GRAFTING (CABG) x 2 using LIMA to the LAD and endoscopic greater saphenous vein harvesting to the RCA.;  Surgeon: Purcell Nails, MD;  Location: [facility] OR;  Service: Open Heart Surgery;  Laterality: N/A;   CYSTOSCOPY  04/04/2019   Procedure: CYSTOSCOPY;  Surgeon: Rene Paci, MD;  Location: [facility];  Service: Urology;;   HERNIA REPAIR  12/11/10   BIH   MITRAL VALVE REPLACEMENT N/A 10/11/2018   Procedure: MITRAL VALVE (MV) REPLACEMENT with 27 Mosaic Bioprosthetic Mitral valve.;  Surgeon: Purcell Nails, MD;  Location: [facility] OR;  Service: Open Heart Surgery;  Laterality: N/A;   PACEMAKER IMPLANT N/A 10/17/2018   Procedure: PACEMAKER IMPLANT;  Surgeon: Hillis Range, MD;  Location: [facility] INVASIVE CV LAB;  Service: Cardiovascular;  Laterality: N/A;   RADIOACTIVE SEED IMPLANT N/A 04/04/2019   Procedure: RADIOACTIVE SEED  IMPLANT/BRACHYTHERAPY IMPLANT, cystoscopy;  Surgeon: Rene Paci, MD;  Location: [facility];  Service: Urology;  Laterality: N/A;  58 seeds   REPAIR OF PATENT FORAMEN OVALE N/A 10/11/2018   Procedure: CLOSURE OF PATENT FORAMEN OVALE;  Surgeon: Purcell Nails, MD;  Location: [facility] OR;  Service: Open Heart Surgery;  Laterality: N/A;   RIGHT/LEFT HEART CATH AND CORONARY ANGIOGRAPHY N/A 09/15/2018   Procedure: RIGHT/LEFT HEART CATH AND CORONARY ANGIOGRAPHY;  Surgeon: Kathleene Hazel, MD;  Location: [facility] INVASIVE CV LAB;  Service: Cardiovascular;  Laterality: N/A;   TEE WITHOUT CARDIOVERSION N/A 12/12/2015   Procedure: TRANSESOPHAGEAL ECHOCARDIOGRAM (TEE);  Surgeon: Thurmon Fair, MD;  Location: [facility] ENDOSCOPY;  Service: Cardiovascular;  Laterality: N/A;   TEE WITHOUT CARDIOVERSION N/A 10/11/2018   Procedure: TRANSESOPHAGEAL ECHOCARDIOGRAM (TEE);  Surgeon: Purcell Nails, MD;  Location: [facility] OR;  Service: Open Heart Surgery;  Laterality: N/A;   THYMECTOMY  1976   radaiation tx done     A IV Location/Drains/Wounds Patient Lines/Drains/Airways Status     Active Line/Drains/Airways     Name Placement date Placement time Site Days   Peripheral IV 05/23/22 18 G Right Antecubital 05/23/22  1115  Antecubital  less than 1            Intake/Output Last 24 hours No intake or output data in the 24 hours ending 05/23/22 1534  Labs/Imaging Results for orders placed or performed during the hospital encounter of 05/23/22 (from the past 48 hour(s))  Basic metabolic panel     Status: Abnormal   Collection Time: 05/23/22 11:26 AM  Result Value Ref Range   Sodium 133 (L) 135 - 145 mmol/L   Potassium 3.4 (L) 3.5 - 5.1 mmol/L   Chloride 102 98 - 111 mmol/L   CO2 21 (L) 22 - 32 mmol/L   Glucose, Bld 128 (H) 70 - 99 mg/dL    Comment: Glucose reference range applies only to samples taken after fasting for at least 8 hours.   BUN 11 8 - 23 mg/dL   Creatinine, Ser 0.62 0.61 -  1.24 mg/dL   Calcium 8.0 (L) 8.9 - 10.3 mg/dL   GFR, Estimated >69 >48 mL/min    Comment: (NOTE) Calculated using the CKD-EPI Creatinine Equation (2021)    Anion gap 10 5 - 15    Comment: Performed at [facility] Lab, [address] [place], [place] [address]  CBC     Status: Abnormal   Collection Time: 05/23/22 11:26 AM  Result Value Ref Range   WBC 10.7 (H) 4.0 - 10.5 K/uL   RBC 3.87 (L) 4.22 - 5.81 MIL/uL   Hemoglobin 10.0 (L) 13.0 - 17.0 g/dL   HCT 03.5 (L) 00.9 - 38.1 %   MCV 81.7 80.0 - 100.0 fL   MCH 25.8 (L) 26.0 - 34.0 pg   MCHC 31.6 30.0 - 36.0 g/dL   RDW  16.4 (H) 11.5 - 15.5 %   Platelets 156 150 - 400 K/uL   nRBC 0.0 0.0 - 0.2 %    Comment: Performed at [facility] Lab, [address], [place], [place] [address]  Troponin I (High Sensitivity)     Status: Abnormal   Collection Time: 05/23/22 11:26 AM  Result Value Ref Range   Troponin I (High Sensitivity) 185 (HH) <18 ng/L    Comment: CRITICAL RESULT CALLED TO, READ BACK BY AND VERIFIED WITH J,NOVIELLO RN  05/23/22 E,BENTON (NOTE) Elevated high sensitivity troponin I (hsTnI) values and significant  changes across serial measurements may suggest ACS but many other  chronic and acute conditions are known to elevate hsTnI results.  Refer to the "Links" section for chest pain algorithms and additional  guidance. Performed at [facility] Lab, [address], [place], [place] [address]   APTT     Status: Abnormal   Collection Time: 05/23/22 11:26 AM  Result Value Ref Range   aPTT 53 (H) 24 - 36 seconds    Comment:        IF BASELINE aPTT IS ELEVATED, SUGGEST PATIENT RISK ASSESSMENT BE USED TO DETERMINE APPROPRIATE ANTICOAGULANT THERAPY. Performed at [facility], [address] [place], [place] [address]   Protime-INR     Status: Abnormal   Collection Time: 05/23/22 11:26 AM  Result Value Ref Range   Prothrombin Time 32.7 (H) 11.4 - 15.2 seconds   INR 3.2 (H) 0.8 - 1.2    Comment:  (NOTE) INR goal varies based on device and disease states. Performed at [facility], [address], [place], [place] [address]   Brain natriuretic peptide     Status: Abnormal   Collection Time: 05/23/22 11:26 AM  Result Value Ref Range   B Natriuretic Peptide 312.7 (H) 0.0 - 100.0 pg/mL    Comment: Performed at [facility], [address], [place], [place] [address]  Troponin I (High Sensitivity)     Status: Abnormal   Collection Time: 05/23/22  1:51 PM  Result Value Ref Range   Troponin I (High Sensitivity) 178 (HH) <18 ng/L    Comment: CRITICAL VALUE NOTED. VALUE IS CONSISTENT WITH PREVIOUSLY REPORTED/CALLED VALUE (NOTE) Elevated high sensitivity troponin I (hsTnI) values and significant  changes across serial measurements may suggest ACS but many other  chronic and acute conditions are known to elevate hsTnI results.  Refer to the "Links" section for chest pain algorithms and additional  guidance. Performed at [facility], [address], [place], [place] [address]    DG Chest Port 1 View  Result Date: 05/23/2022 CLINICAL DATA:  Dyspnea on exertion. EXAM: PORTABLE CHEST 1 VIEW COMPARISON:  May 29, 2019. FINDINGS: Stable cardiomediastinal silhouette. Lungs are clear. Left-sided pacemaker is unchanged. Sternotomy wires are noted. Bony thorax is unremarkable. IMPRESSION: No active disease. Electronically Signed   By: Lupita Raider M.D.   On: 05/23/2022 11:52    Pending Labs Unresulted Labs (From admission, onward)     Start     Ordered   05/24/22 0500  Comprehensive metabolic panel  Tomorrow morning,   R        05/23/22 1450   05/24/22 0500  CBC  Tomorrow morning,   R        05/23/22 1450   05/23/22 1433  Magnesium  Add-on,   AD        05/23/22 1432  Vitals/Pain Today's Vitals   05/23/22 1315 05/23/22 1330 05/23/22 1345 05/23/22 1500  BP: 102/74 99/78 110/77 106/76  Pulse: 94 97 97 95  Resp: 11 13 19  (!) 22  Temp:      TempSrc:       SpO2: 97% 95% 97% 95%    Isolation Precautions No active isolations  Medications Medications  atorvastatin (LIPITOR) tablet 20 mg (has no administration in time range)  metoprolol tartrate (LOPRESSOR) tablet 25 mg (has no administration in time range)  sodium chloride flush (NS) 0.9 % injection 3 mL (3 mLs Intravenous Given 05/23/22 1502)  acetaminophen (TYLENOL) tablet 650 mg (has no administration in time range)    Or  acetaminophen (TYLENOL) suppository 650 mg (has no administration in time range)  polyethylene glycol (MIRALAX / GLYCOLAX) packet 17 g (has no administration in time range)  potassium chloride SA (KLOR-CON M) CR tablet 40 mEq (40 mEq Oral Given 05/23/22 1501)    Mobility walks     Focused Assessments Cardiac Assessment Handoff:  Cardiac Rhythm: Normal sinus rhythm No results found for: "CKTOTAL", "CKMB", "CKMBINDEX", "TROPONINI" No results found for: "DDIMER" Does the Patient currently have chest pain? No   , Neuro Assessment Handoff:  Swallow screen pass?  N/a Cardiac Rhythm: Normal sinus rhythm       Neuro Assessment:   Neuro Checks:      Has TPA been given? No If patient is a Neuro Trauma and patient is going to OR before floor call report to 4N Charge nurse: [phone] or [phone]  , Renal Assessment Handoff  Hemodialysis Schedule: n/a Last Hemodialysis date and time: n/a   Restricted appendage:   , Pulmonary Assessment Handoff:  Lung sounds:          R Recommendations: See Admitting Provider Note  Report given to:   Additional Notes:

## 2022-05-23 NOTE — Progress Notes (Signed)
VASCULAR LAB    Bilateral lower extremity venous duplex has been performed.  See CV proc for preliminary results.  Called Dr. Alinda Money with results  Rosezetta Schlatter, [facility], RVT 05/23/2022, 4:37 PM

## 2022-05-24 ENCOUNTER — Observation Stay (HOSPITAL_COMMUNITY): Payer: Medicare HMO

## 2022-05-24 ENCOUNTER — Telehealth (HOSPITAL_COMMUNITY): Payer: Self-pay

## 2022-05-24 ENCOUNTER — Other Ambulatory Visit (HOSPITAL_COMMUNITY): Payer: Self-pay

## 2022-05-24 DIAGNOSIS — I214 Non-ST elevation (NSTEMI) myocardial infarction: Secondary | ICD-10-CM | POA: Diagnosis not present

## 2022-05-24 DIAGNOSIS — I4892 Unspecified atrial flutter: Secondary | ICD-10-CM | POA: Diagnosis not present

## 2022-05-24 DIAGNOSIS — M79605 Pain in left leg: Secondary | ICD-10-CM | POA: Diagnosis not present

## 2022-05-24 DIAGNOSIS — I442 Atrioventricular block, complete: Secondary | ICD-10-CM | POA: Diagnosis not present

## 2022-05-24 DIAGNOSIS — Z8774 Personal history of (corrected) congenital malformations of heart and circulatory system: Secondary | ICD-10-CM | POA: Diagnosis not present

## 2022-05-24 LAB — CBC
HCT: 32.4 % — ABNORMAL LOW (ref 39.0–52.0)
Hemoglobin: 10.5 g/dL — ABNORMAL LOW (ref 13.0–17.0)
MCH: 26.1 pg (ref 26.0–34.0)
MCHC: 32.4 g/dL (ref 30.0–36.0)
MCV: 80.4 fL (ref 80.0–100.0)
Platelets: 139 10*3/uL — ABNORMAL LOW (ref 150–400)
RBC: 4.03 MIL/uL — ABNORMAL LOW (ref 4.22–5.81)
RDW: 16.4 % — ABNORMAL HIGH (ref 11.5–15.5)
WBC: 8.7 10*3/uL (ref 4.0–10.5)
nRBC: 0 % (ref 0.0–0.2)

## 2022-05-24 LAB — COMPREHENSIVE METABOLIC PANEL
ALT: 19 U/L (ref 0–44)
ALT: 17 U/L (ref 0–44)
AST: 26 U/L (ref 15–41)
AST: 27 U/L (ref 15–41)
Albumin: 2.5 g/dL — ABNORMAL LOW (ref 3.5–5.0)
Albumin: 2.3 g/dL — ABNORMAL LOW (ref 3.5–5.0)
Alkaline Phosphatase: 84 U/L (ref 38–126)
Alkaline Phosphatase: 76 U/L (ref 38–126)
Anion gap: 7 (ref 5–15)
Anion gap: 10 (ref 5–15)
BUN: 10 mg/dL (ref 8–23)
BUN: 10 mg/dL (ref 8–23)
CO2: 21 mmol/L — ABNORMAL LOW (ref 22–32)
CO2: 20 mmol/L — ABNORMAL LOW (ref 22–32)
Calcium: 8 mg/dL — ABNORMAL LOW (ref 8.9–10.3)
Calcium: 8 mg/dL — ABNORMAL LOW (ref 8.9–10.3)
Chloride: 102 mmol/L (ref 98–111)
Chloride: 101 mmol/L (ref 98–111)
Creatinine, Ser: 0.95 mg/dL (ref 0.61–1.24)
Creatinine, Ser: 0.92 mg/dL (ref 0.61–1.24)
GFR, Estimated: >60 mL/min (ref >60)
GFR, Estimated: >60 mL/min (ref >60)
Glucose, Bld: 166 mg/dL — ABNORMAL HIGH (ref 70–99)
Glucose, Bld: 156 mg/dL — ABNORMAL HIGH (ref 70–99)
Potassium: 3.4 mmol/L — ABNORMAL LOW (ref 3.5–5.1)
Potassium: 3.4 mmol/L — ABNORMAL LOW (ref 3.5–5.1)
Sodium: 130 mmol/L — ABNORMAL LOW (ref 135–145)
Sodium: 131 mmol/L — ABNORMAL LOW (ref 135–145)
Total Bilirubin: 1.6 mg/dL — ABNORMAL HIGH (ref 0.3–1.2)
Total Bilirubin: 1.3 mg/dL — ABNORMAL HIGH (ref 0.3–1.2)
Total Protein: 6 g/dL — ABNORMAL LOW (ref 6.5–8.1)
Total Protein: 6 g/dL — ABNORMAL LOW (ref 6.5–8.1)

## 2022-05-24 LAB — CBC WITH DIFFERENTIAL/PLATELET
Abs Immature Granulocytes: 0.04 10*3/uL (ref 0.00–0.07)
Basophils Absolute: 0 10*3/uL (ref 0.0–0.1)
Basophils Relative: 0 %
Eosinophils Absolute: 0 10*3/uL (ref 0.0–0.5)
Eosinophils Relative: 0 %
HCT: 31.2 % — ABNORMAL LOW (ref 39.0–52.0)
Hemoglobin: 9.7 g/dL — ABNORMAL LOW (ref 13.0–17.0)
Immature Granulocytes: 1 %
Lymphocytes Relative: 7 %
Lymphs Abs: 0.5 10*3/uL — ABNORMAL LOW (ref 0.7–4.0)
MCH: 25.3 pg — ABNORMAL LOW (ref 26.0–34.0)
MCHC: 31.1 g/dL (ref 30.0–36.0)
MCV: 81.5 fL (ref 80.0–100.0)
Monocytes Absolute: 0.6 10*3/uL (ref 0.1–1.0)
Monocytes Relative: 7 %
Neutro Abs: 6.8 10*3/uL (ref 1.7–7.7)
Neutrophils Relative %: 85 %
Platelets: 134 10*3/uL — ABNORMAL LOW (ref 150–400)
RBC: 3.83 MIL/uL — ABNORMAL LOW (ref 4.22–5.81)
RDW: 16.4 % — ABNORMAL HIGH (ref 11.5–15.5)
WBC: 8 10*3/uL (ref 4.0–10.5)
nRBC: 0 % (ref 0.0–0.2)

## 2022-05-24 LAB — TROPONIN I (HIGH SENSITIVITY)
Troponin I (High Sensitivity): 88 ng/L — ABNORMAL HIGH (ref <18)
Troponin I (High Sensitivity): 72 ng/L — ABNORMAL HIGH (ref <18)

## 2022-05-24 LAB — IRON AND TIBC
Iron: 11 ug/dL — ABNORMAL LOW (ref 45–182)
Saturation Ratios: 6 % — ABNORMAL LOW (ref 17.9–39.5)
TIBC: 192 ug/dL — ABNORMAL LOW (ref 250–450)
UIBC: 181 ug/dL

## 2022-05-24 LAB — RETICULOCYTES
Immature Retic Fract: 23.3 % — ABNORMAL HIGH (ref 2.3–15.9)
RBC.: 3.78 MIL/uL — ABNORMAL LOW (ref 4.22–5.81)
Retic Count, Absolute: 53.3 10*3/uL (ref 19.0–186.0)
Retic Ct Pct: 1.4 % (ref 0.4–3.1)

## 2022-05-24 LAB — PROTIME-INR
INR: 3 — ABNORMAL HIGH (ref 0.8–1.2)
INR: 2.9 — ABNORMAL HIGH (ref 0.8–1.2)
Prothrombin Time: 30.7 seconds — ABNORMAL HIGH (ref 11.4–15.2)
Prothrombin Time: 30.5 seconds — ABNORMAL HIGH (ref 11.4–15.2)

## 2022-05-24 LAB — PHOSPHORUS: Phosphorus: 2.4 mg/dL — ABNORMAL LOW (ref 2.5–4.6)

## 2022-05-24 LAB — MAGNESIUM: Magnesium: 1.9 mg/dL (ref 1.7–2.4)

## 2022-05-24 LAB — VITAMIN B12: Vitamin B-12: 292 pg/mL (ref 180–914)

## 2022-05-24 LAB — FOLATE: Folate: 8.2 ng/mL (ref >5.9)

## 2022-05-24 LAB — FERRITIN: Ferritin: 272 ng/mL (ref 24–336)

## 2022-05-24 MED ORDER — SODIUM CHLORIDE 0.9% FLUSH: 3.0000 mL | INTRAVENOUS | Status: DC | PRN

## 2022-05-24 MED ORDER — POTASSIUM CHLORIDE CRYS ER 20 MEQ PO TBCR
40.0000 meq | EXTENDED_RELEASE_TABLET | Freq: Two times a day (BID) | ORAL | Status: DC
  Administered 2022-05-24: 40 meq via ORAL
  Filled 2022-05-24: qty 2

## 2022-05-24 MED ORDER — SODIUM CHLORIDE 0.9 % IV SOLN: INTRAVENOUS | Status: DC

## 2022-05-24 MED ORDER — MAGNESIUM SULFATE 2 GM/50ML IV SOLN
2.0000 g | Freq: Once | INTRAVENOUS | Status: AC
  Administered 2022-05-24: 2 g via INTRAVENOUS
  Filled 2022-05-24: qty 50

## 2022-05-24 MED ORDER — POTASSIUM CHLORIDE CRYS ER 20 MEQ PO TBCR: 40.0000 meq | EXTENDED_RELEASE_TABLET | Freq: Two times a day (BID) | ORAL | Status: DC

## 2022-05-24 MED ORDER — SODIUM CHLORIDE 0.9% FLUSH
3.0000 mL | Freq: Two times a day (BID) | INTRAVENOUS | Status: DC
  Administered 2022-05-24 (×2): 3 mL via INTRAVENOUS

## 2022-05-24 MED ORDER — SODIUM CHLORIDE 0.9 % IV SOLN: 250.0000 mL | INTRAVENOUS | Status: DC | PRN

## 2022-05-24 MED ORDER — SODIUM CHLORIDE 0.9% FLUSH
3.0000 mL | Freq: Two times a day (BID) | INTRAVENOUS | Status: DC
  Administered 2022-05-24 – 2022-05-25 (×2): 3 mL via INTRAVENOUS

## 2022-05-24 MED ORDER — FUROSEMIDE 10 MG/ML IJ SOLN
40.0000 mg | Freq: Once | INTRAMUSCULAR | Status: AC
  Administered 2022-05-24: 40 mg via INTRAVENOUS
  Filled 2022-05-24: qty 4

## 2022-05-24 MED ORDER — PHYTONADIONE 5 MG PO TABS
5.0000 mg | ORAL_TABLET | Freq: Once | ORAL | Status: AC
  Administered 2022-05-24: 5 mg via ORAL
  Filled 2022-05-24: qty 1

## 2022-05-24 MED ORDER — ASPIRIN 81 MG PO CHEW
81.0000 mg | CHEWABLE_TABLET | ORAL | Status: AC
  Administered 2022-05-25: 81 mg via ORAL
  Filled 2022-05-24: qty 1

## 2022-05-24 MED ORDER — POTASSIUM CHLORIDE CRYS ER 20 MEQ PO TBCR
40.0000 meq | EXTENDED_RELEASE_TABLET | Freq: Once | ORAL | Status: AC
  Administered 2022-05-24: 40 meq via ORAL
  Filled 2022-05-24: qty 2

## 2022-05-24 MED ORDER — IOHEXOL 350 MG/ML SOLN
100.0000 mL | Freq: Once | INTRAVENOUS | Status: AC | PRN
  Administered 2022-05-24: 100 mL via INTRAVENOUS

## 2022-05-24 MED ORDER — ASPIRIN 81 MG PO CHEW: 81.0000 mg | CHEWABLE_TABLET | ORAL | Status: DC

## 2022-05-24 NOTE — Progress Notes (Addendum)
ANTICOAGULATION CONSULT NOTE  Pharmacy Consult for heparin Indication: atrial fibrillation  No Known Allergies  Patient Measurements: Height: 5\' 7"  (170.2 cm) Weight: 99 kg (218 lb 3.2 oz) IBW/kg (Calculated) : 66.1 Heparin Dosing Weight: 99kg  Vital Signs: Temp: 98.7 F (37.1 C) (04/15 0900) Temp Source: Oral (04/15 0816) BP: 103/78 (04/15 0927) Pulse Rate: 110 (04/15 0927)  Labs: Recent Labs    05/23/22 1126 05/23/22 1351 05/24/22 0742 05/24/22 0826  HGB 10.0*  --  10.5* 9.7*  HCT 31.6*  --  32.4* 31.2*  PLT 156  --  139* 134*  APTT 53*  --   --   --   LABPROT 32.7*  --   --  30.7*  INR 3.2*  --   --  3.0*  CREATININE 1.22  --  0.95  --   TROPONINIHS 185* 178*  --   --     Estimated Creatinine Clearance: 80 mL/min (by C-G formula based on SCr of 0.95 mg/dL).   Medical History: Past Medical History:  Diagnosis Date   BCE (basal cell epithelioma), arm    RIGHT SHOULDER   Bradycardia    Bumps on skin    on left side of nose for last 3 weeks   CAD (coronary artery disease)    a. s/p CABGx2 10/11/18 LIMA to LAD, SVG to RCA, EVH via right thigh.   Dyslipidemia    Ectatic thoracic aorta    Esophageal stricture    GERD (gastroesophageal reflux disease)    Heart murmur    sees dr Rose Fillers   Hyperlipidemia    Incidental pulmonary nodule, > 24mm and < 107mm 10/04/2018   Noted on CTA   Inguinal hernia    bilateral   Obesity    Presence of permanent cardiac pacemaker    Prostate cancer    S/P aortic valve replacement with bioprosthetic valve 10/11/2018   23 mm Edwards Inspiris Resilia stented bovine pericardial tissue valve, miltral valve done also   S/P CABG x 2 10/11/2018   LIMA to LAD, SVG to RCA, EVH via right thigh   S/P mitral valve replacement with bioprosthetic valve 10/11/2018   27 mm Medtronic Mosaic stented porcine bioprosthetic tissue valve   S/P patent foramen ovale closure 10/11/2018   S/P placement of cardiac pacemaker    Smoker        Assessment: 34 yoM admitted with DOE. Pt with hx bMVR/AVR and AF on warfarin PTA. Warfarin held on admit, planning for heparin once INR <2.  INR today is 3, CBC stable.  Goal of Therapy:  Heparin level 0.3-0.7 units/ml Monitor platelets by anticoagulation protocol: Yes   Plan:  Heparin once INR <2 Daily INR  ADDENDUM - will give vitamin K to facilitate pt being able to undergo cardiac cath. Last dose of warfarin 4/13 so will give PO.   Fredonia Highland, PharmD, BCPS, [facility] Clinical Pharmacist [phone] Please check AMION for all [facility] Pharmacy numbers 05/24/2022

## 2022-05-24 NOTE — Telephone Encounter (Signed)
Pharmacy Patient Advocate Encounter  Insurance verification completed.    The patient is insured through [employer]   The patient is currently admitted and ran test claims for the following: Eliquis.  Copays and coinsurance results were relayed to Inpatient clinical team.  This test claim was processed through [facility]- copay amounts may vary at other pharmacies due to pharmacy/plan contracts, or as the patient moves through the different stages of their insurance plan.

## 2022-05-24 NOTE — Progress Notes (Addendum)
Rounding Note    Patient Name: [name] Date of Encounter: 05/24/2022  [facility] Cardiologist: Verne Carrow, MD   Subjective   No complaints this morning. Comfortable in the bed.   Inpatient Medications    Scheduled Meds:  atorvastatin  20 mg Oral Daily   metoprolol tartrate  25 mg Oral BID   sodium chloride flush  3 mL Intravenous Q12H   Continuous Infusions:  PRN Meds: acetaminophen **OR** acetaminophen, polyethylene glycol   Vital Signs    Vitals:   05/23/22 2001 05/24/22 0012 05/24/22 0353 05/24/22 0816  BP: 115/74 103/70 124/78 98/68  Pulse: 98 (!) 102 (!) 102 (!) 103  Resp:  18 16 19   Temp: 98.6 F (37 C) 98 F (36.7 C) 98.4 F (36.9 C) 98.7 F (37.1 C)  TempSrc: Oral Oral Oral Oral  SpO2: 95% 97% 97% 95%  Weight:   99 kg   Height:        Intake/Output Summary (Last 24 hours) at 05/24/2022 0823 Last data filed at 05/24/2022 0200 Gross per 24 hour  Intake 240 ml  Output 800 ml  Net -560 ml      05/24/2022    3:53 AM 05/23/2022    6:23 PM 05/18/2022   10:15 AM  Last 3 Weights  Weight (lbs) 218 lb 3.2 oz 224 lb 3.3 oz 223 lb  Weight (kg) 98.975 kg 101.7 kg 101.152 kg      Telemetry    Sinus Tachycardia V paced - Personally Reviewed  ECG    No new tracing   Physical Exam   GEN: No acute distress.   Neck: No JVD Cardiac: RRR, no murmurs, rubs, or gallops.  Respiratory: Clear to auscultation bilaterally. GI: Soft, nontender, non-distended  MS: No edema; No deformity. Neuro:  Nonfocal  Psych: Normal affect   Labs    High Sensitivity Troponin:   Recent Labs  Lab 05/23/22 1126 05/23/22 1351  TROPONINIHS 185* 178*     Chemistry Recent Labs  Lab 05/23/22 1126 05/23/22 1830  NA 133*  --   K 3.4*  --   CL 102  --   CO2 21*  --   GLUCOSE 128*  --   BUN 11  --   CREATININE 1.22  --   CALCIUM 8.0*  --   MG  --  1.9  GFRNONAA >60  --   ANIONGAP 10  --     Lipids No results for input(s): "CHOL",  "TRIG", "HDL", "LABVLDL", "LDLCALC", "CHOLHDL" in the last 168 hours.  Hematology Recent Labs  Lab 05/23/22 1126 05/24/22 0742  WBC 10.7* 8.7  RBC 3.87* 4.03*  HGB 10.0* 10.5*  HCT 31.6* 32.4*  MCV 81.7 80.4  MCH 25.8* 26.1  MCHC 31.6 32.4  RDW 16.4* 16.4*  PLT 156 139*   Thyroid No results for input(s): "TSH", "FREET4" in the last 168 hours.  BNP Recent Labs  Lab 05/23/22 1126  BNP 312.7*    DDimer No results for input(s): "DDIMER" in the last 168 hours.   Radiology    VAS [place] LOWER EXTREMITY VENOUS (DVT) (7a-7p)  Result Date: 05/24/2022  Lower Venous DVT Study Patient Name:  TYREASE VANDEBERG  Date of Exam:   05/23/2022 Medical Rec #: [id number]       Accession #:    [id number] Date of Birth: [birth year]       Patient Gender: M Patient Age:   45 years Exam Location:  [facility] Procedure:  VAS [place] LOWER EXTREMITY VENOUS (DVT) Referring Phys: Apolinar Junes MORELLI --------------------------------------------------------------------------------  Indications: Left leg pain with walking. Other Indications: Patient had fluid drained off knee 05/05/22. Comparison Study: No prior study on file Performing Technologist: Sherren Kerns RVS  Examination Guidelines: A complete evaluation includes B-mode imaging, spectral Doppler, color Doppler, and power Doppler as needed of all accessible portions of each vessel. Bilateral testing is considered an integral part of a complete examination. Limited examinations for reoccurring indications may be performed as noted. The reflux portion of the exam is performed with the patient in reverse Trendelenburg.  +---------+---------------+---------+-----------+----------+--------------+ RIGHT    CompressibilityPhasicitySpontaneityPropertiesThrombus Aging +---------+---------------+---------+-----------+----------+--------------+ CFV      Full           Yes      Yes                                  +---------+---------------+---------+-----------+----------+--------------+ SFJ      Full                                                        +---------+---------------+---------+-----------+----------+--------------+ FV Prox  Full                                                        +---------+---------------+---------+-----------+----------+--------------+ FV Mid   Full           Yes      Yes                                 +---------+---------------+---------+-----------+----------+--------------+ FV DistalFull                                                        +---------+---------------+---------+-----------+----------+--------------+ PFV      Full                                                        +---------+---------------+---------+-----------+----------+--------------+ POP      Full           Yes      Yes                                 +---------+---------------+---------+-----------+----------+--------------+ PTV      Full                                                        +---------+---------------+---------+-----------+----------+--------------+ PERO     Full                                                        +---------+---------------+---------+-----------+----------+--------------+   +---------+---------------+---------+-----------+----------+--------------+  LEFT     CompressibilityPhasicitySpontaneityPropertiesThrombus Aging +---------+---------------+---------+-----------+----------+--------------+ CFV      Full           Yes      Yes                                 +---------+---------------+---------+-----------+----------+--------------+ SFJ      Full                                                        +---------+---------------+---------+-----------+----------+--------------+ FV Prox  Full                                                         +---------+---------------+---------+-----------+----------+--------------+ FV Mid   Full           Yes      Yes                                 +---------+---------------+---------+-----------+----------+--------------+ FV DistalFull                                                        +---------+---------------+---------+-----------+----------+--------------+ PFV      Full                                                        +---------+---------------+---------+-----------+----------+--------------+ POP      Full           Yes      Yes                                 +---------+---------------+---------+-----------+----------+--------------+ PTV      Full                                                        +---------+---------------+---------+-----------+----------+--------------+ PERO     Full                                                        +---------+---------------+---------+-----------+----------+--------------+     Summary: RIGHT: - There is no evidence of deep vein thrombosis in the lower extremity.  - No cystic structure found in the popliteal fossa.  LEFT: - There is no evidence of deep vein thrombosis in the lower extremity.  - There appears to be, what may be  consistent with, a partially thrombosed pseudoaneurysm in the popliteal fossa/proximal calf area adjacent to an effusion filled with mixed echoes. Further imaging should be considered to differentiate/classify findings.  *See table(s) above for measurements and observations. Electronically signed by Coral Else MD on 05/24/2022 at 8:05:33 AM.    Final    ECHOCARDIOGRAM COMPLETE  Result Date: 05/23/2022    ECHOCARDIOGRAM REPORT   Patient Name:   [name] Date of Exam: 05/23/2022 Medical Rec #:  [id number]      Height:       67.0 in Accession #:    [id number]     Weight:       223.0 lb Date of Birth:  21-Oct-1950      BSA:          2.118 m Patient Age:    71 years       BP:            106/76 mmHg Patient Gender: M              HR:           96 bpm. Exam Location:  Inpatient Procedure: 2D Echo, Cardiac Doppler, Color Doppler and Intracardiac            Opacification Agent Indications:    Dyspnea  History:        Patient has prior history of Echocardiogram examinations, most                 recent 06/13/2019. Aortic Valve Disease and Mitral Valve Disease.                 23mm Edwards Inspiris Resilia AVR, 27mm Bovine AVR.  Sonographer:    Milbert Coulter Referring Phys: [id number] Cecille Po MELVIN  Sonographer Comments: No subcostal window. Image acquisition challenging due to patient body habitus. IMPRESSIONS  1. Right ventricular systolic function is moderately reduced. The right ventricular size is severely enlarged.  2. Left ventricular ejection fraction, by estimation, is 55 to 60%. The left ventricle has normal function. The left ventricle has no regional wall motion abnormalities. Left ventricular diastolic parameters are indeterminate. There is the interventricular septum is flattened in systole, consistent with right ventricular pressure overload and the interventricular septum is flattened in diastole ('D' shaped left ventricle), consistent with right ventricular volume overload.  3. The mitral valve has been repaired/replaced. Trivial mitral valve regurgitation. There is at least moderate mitral valve prosthetic stenosis with a gradient is 11.0 mmHg at HR 95bpm.  4. Tricuspid valve regurgitation is severe.  5. The aortic valve has been repaired/replaced. Aortic valve regurgitation is not visualized. Echo findings are consistent with normal structure and function of the aortic valve prosthesis. Aortic valve mean gradient measures 7.0 mmHg. Aortic valve Vmax  measures 1.78 m/s. DI 0.5.  6. Left atrial size was severely dilated.  7. Right atrial size was severely dilated. Comparison(s): Compared to prior TTE in 06/2019, the RV is now severely dilated and moderately hypokinetic with severe TR.  FINDINGS  Left Ventricle: Left ventricular ejection fraction, by estimation, is 55 to 60%. The left ventricle has normal function. The left ventricle has no regional wall motion abnormalities. Definity contrast agent was given IV to delineate the left ventricular  endocardial borders. The left ventricular internal cavity size was small. There is no left ventricular hypertrophy. The interventricular septum is flattened in systole, consistent with right ventricular pressure overload and the interventricular septum is flattened in diastole ('D' shaped left ventricle),  consistent with right ventricular volume overload. Left ventricular diastolic parameters are indeterminate. Right Ventricle: The right ventricular size is severely enlarged. Right vetricular wall thickness was not well visualized. Right ventricular systolic function is moderately reduced. Left Atrium: Left atrial size was severely dilated. Right Atrium: Right atrial size was severely dilated. Pericardium: There is no evidence of pericardial effusion. Mitral Valve: There is at least moderate prosthetic valve stenosis with mean gradient at HR 95bpm. The mitral valve has been repaired/replaced. Trivial mitral valve regurgitation. There is a 27 mm Medtronic bioprosthetic valve present in the mitral position. MV peak gradient, 16.8 mmHg. The mean mitral valve gradient is 11.0 mmHg. Tricuspid Valve: The tricuspid valve is grossly normal. Tricuspid valve regurgitation is severe. Aortic Valve: DI 0.51. The aortic valve has been repaired/replaced. Aortic valve regurgitation is not visualized. Aortic valve mean gradient measures 7.0 mmHg. Aortic valve peak gradient measures 12.7 mmHg. There is a 23 mm bioprosthetic valve present in  the aortic position. Echo findings are consistent with normal structure and function of the aortic valve prosthesis. Pulmonic Valve: The pulmonic valve was normal in structure. Pulmonic valve regurgitation is trivial. Aorta: The  aortic root is normal in size and structure. IAS/Shunts: The atrial septum is grossly normal. Additional Comments: A device lead is visualized.  LEFT VENTRICLE PLAX 2D LVIDd:         3.90 cm LVIDs:         3.20 cm LV PW:         1.10 cm LV IVS:        1.00 cm  RIGHT VENTRICLE RV S prime:     7.07 cm/s TAPSE (M-mode): 1.1 cm LEFT ATRIUM         Index LA diam:    5.70 cm 2.69 cm/m  AORTIC VALVE AV Vmax:           178.00 cm/s AV Vmean:          121.500 cm/s AV VTI:            0.292 m AV Peak Grad:      12.7 mmHg AV Mean Grad:      7.0 mmHg LVOT Vmax:         98.00 cm/s LVOT Vmean:        66.600 cm/s LVOT VTI:          0.158 m LVOT/AV VTI ratio: 0.54 MITRAL VALVE MV Peak grad: 16.8 mmHg  SHUNTS MV Mean grad: 11.0 mmHg  Systemic VTI: 0.16 m MV Vmax:      2.05 m/s MV Vmean:     161.0 cm/s Laurance Flatten MD Electronically signed by Laurance Flatten MD Signature Date/Time: 05/23/2022/5:39:50 PM    Final    DG Chest Port 1 View  Result Date: 05/23/2022 CLINICAL DATA:  Dyspnea on exertion. EXAM: PORTABLE CHEST 1 VIEW COMPARISON:  May 29, 2019. FINDINGS: Stable cardiomediastinal silhouette. Lungs are clear. Left-sided pacemaker is unchanged. Sternotomy wires are noted. Bony thorax is unremarkable. IMPRESSION: No active disease. Electronically Signed   By: Lupita Raider M.D.   On: 05/23/2022 11:52    Cardiac Studies   Echo: 05/23/2022  IMPRESSIONS     1. Right ventricular systolic function is moderately reduced. The right  ventricular size is severely enlarged.   2. Left ventricular ejection fraction, by estimation, is 55 to 60%. The  left ventricle has normal function. The left ventricle has no regional  wall motion abnormalities. Left ventricular diastolic parameters are  indeterminate.  There is the  interventricular septum is flattened in systole, consistent with right  ventricular pressure overload and the interventricular septum is flattened  in diastole ('D' shaped left ventricle), consistent  with right ventricular  volume overload.   3. The mitral valve has been repaired/replaced. Trivial mitral valve  regurgitation. 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   4. Tricuspid valve regurgitation is severe.   5. The aortic valve has been repaired/replaced. Aortic valve  regurgitation is not visualized. Echo findings are consistent with normal  structure and function of the aortic valve prosthesis. Aortic valve mean  gradient measures 7.0 mmHg. Aortic valve Vmax   measures 1.78 m/s. DI 0.5.   6. Left atrial size was severely dilated.   7. Right atrial size was severely dilated.   Comparison(s): Compared to prior TTE in 06/2019, the RV is now severely  dilated and moderately hypokinetic with severe TR.   FINDINGS   Left Ventricle: Left ventricular ejection fraction, by estimation, is 55  to 60%. The left ventricle has normal function. The left ventricle has no  regional wall motion abnormalities. Definity contrast agent was given IV  to delineate the left ventricular   endocardial borders. The left ventricular internal cavity size was small.  There is no left ventricular hypertrophy. The interventricular septum is  flattened in systole, consistent with right ventricular pressure overload  and the interventricular septum  is flattened in diastole ('D' shaped left ventricle), consistent with  right ventricular volume overload. Left ventricular diastolic parameters  are indeterminate.   Right Ventricle: The right ventricular size is severely enlarged. Right  vetricular wall thickness was not well visualized. Right ventricular  systolic function is moderately reduced.   Left Atrium: Left atrial size was severely dilated.   Right Atrium: Right atrial size was severely dilated.   Pericardium: There is no evidence of pericardial effusion.   Mitral Valve: There is at least moderate prosthetic valve stenosis with  mean  gradient at HR 95bpm. The mitral valve has been  repaired/replaced. Trivial mitral valve regurgitation. There is a 27 mm  Medtronic bioprosthetic valve present in the  mitral position. MV peak gradient, 16.8 mmHg. The mean mitral valve  gradient is 11.0 mmHg.   Tricuspid Valve: The tricuspid valve is grossly normal. Tricuspid valve  regurgitation is severe.   Aortic Valve: DI 0.51. The aortic valve has been repaired/replaced. Aortic  valve regurgitation is not visualized. Aortic valve mean gradient measures  7.0 mmHg. Aortic valve peak gradient measures 12.7 mmHg. There is a 23 mm  bioprosthetic valve present in   the aortic position. Echo findings are consistent with normal structure  and function of the aortic valve prosthesis.   Pulmonic Valve: The pulmonic valve was normal in structure. Pulmonic valve  regurgitation is trivial.   Aorta: The aortic root is normal in size and structure.   IAS/Shunts: The atrial septum is grossly normal.   Additional Comments: A device lead is visualized.    Patient Profile     72 y.o. male with a hx of CAD s/p 2V CABG in 2020, aortic stenosis s/p AVR in 2020, mitral stenosis s/p MVR in 2020, symptomatic bradycardia s/p pacemaker placement, atrial flutter, tobacco abuse, hyperlipidemia, prostate cancer and esophageal stricture  who was seen 05/23/2022 for the evaluation of DOE at the request of Dr. Renaye Rakers.   Assessment & Plan    Dyspnea  Generalized Malaise CAD s/p  2v CABG -- presented with generalized malaise, no reports of chest pain. Story is somewhat vague. Mild dyspnea. hsTn 185>>178. Mostly complains of left leg pain and weakness which has limited his activity. Had fluid drained off the left knee several weeks ago.  -- coumadin held on admission, INR pending this morning   Hx of severe AS and MS s/p AVR/MVR Moderate to Severe RV enlargement  -- bioprosthetic AVR and MVR with CABG in 2020 -- echo this admission shows normal  aortic function and trivial MR -- worsening TR from moderate on prior echo 06/2019 to severe this admission -- RV now severely enlarged  CHB -- s/p PPM -- follows with EP  Hx of atrial flutter -- remains in sinus rhythm, Vpaced on telemetry -- holding coumadin as above, IV heparin once INR < 2  Recent Weight loss -- unclear etiology for the past several months -- undergoing work up as an outpatient with GI and planned for colonoscopy    For questions or updates, please contact  [facility] Please consult [email or web address] for contact info under        Signed, Laverda Page, NP  05/24/2022, 8:23 AM     ATTENDING ATTESTATION:  After conducting a review of all available clinical information with the care team, interviewing the patient, and performing a physical exam, I agree with the findings and plan described in this note.   GEN: No acute distress.   HEENT:  MMM, unable to assess JVD due to neck girth, no scleral icterus Cardiac: RRR, no murmurs, rubs, or gallops.  Respiratory: Clear to auscultation bilaterally. GI: Soft, nontender, non-distended  MS: No edema; No deformity.  Warm, well perfused Neuro:  Nonfocal  Vasc:  +2 radial pulses  Patient is a 72 year old male with a history of coronary artery disease status post CABG consisting of a LIMA to LAD and vein graft right coronary artery in 2020 with concomitant aortic bioprosthetic valve replacement, mitral bioprosthetic valve replacement and postoperative heart block requiring permanent pacemaker.  He also has a history of atrial flutter and for this reason remains on Coumadin.  The patient was admitted with failure to thrive and dyspnea.  His BNP was elevated.  I reviewed his echocardiogram today which demonstrated an enlarged RV with evidence of RV pressure overload, severe tricuspid regurgitation, moderate mitral stenosis, and an underfilled left ventricle.  His troponins are minimally elevated.  He denies any  exertional angina.  He does endorse early satiety.  He denies any significant peripheral edema.  Will give the patient Lasix 40 mg IV x 1 and refer him for right heart catheterization, LVEDP assessment, and bypass and coronary angiography.  I am most concerned about the patient's RV function and pulmonary pressures.  If his PVR is elevated then we will obtain advanced heart failure consultation.  This may in fact be the case given how underfilled the left ventricle looks.  Will give p.o. vitamin K to reverse INR and again the patient does not have a mechanical mitral prosthesis.  NPO at midnight for invasive assessment.  Alverda Skeans, MD Pager [phone]

## 2022-05-24 NOTE — TOC Benefit Eligibility Note (Signed)
Patient Product/process development scientist completed.    The patient is currently admitted and upon discharge could be taking Eliquis.  The current 30 day co-pay is $45.00.   The patient is insured through [facility]   This test claim was processed through [facility]- copay amounts may vary at other pharmacies due to pharmacy/plan contracts, or as the patient moves through the different stages of their insurance plan.

## 2022-05-24 NOTE — Care Management Obs Status (Signed)
MEDICARE OBSERVATION STATUS NOTIFICATION   Patient Details  Name: [name] MRN: [id number] Date of Birth: 07-25-1950   Medicare Observation Status Notification Given:  Yes    Gala Lewandowsky, RN 05/24/2022, 4:47 PM

## 2022-05-24 NOTE — Care Management (Signed)
  Transition of Care [facility]) Screening Note   Patient Details  Name: [name] Date of Birth: Apr 03, 1950   Transition of Care [facility]) CM/SW Contact:    Gala Lewandowsky, RN Phone Number: 05/24/2022, 4:48 PM    Transition of Care Department [facility]) has reviewed the patient and no TOC needs have been identified at this time. Patient presented for dyspnea on exertion. PTA patient was independent from home with spouse. Plan for [facility] 05-25-22. We will continue to monitor patient advancement through interdisciplinary progression rounds. If new patient transition needs arise, please place a TOC consult.

## 2022-05-24 NOTE — Progress Notes (Addendum)
PROGRESS NOTE    [name]  ENI:[id number] DOB: 1951-01-22 DOA: 05/23/2022 PCP: [name], MD     Brief Narrative:   72 y.o. WM PMHx CAD status post CABG x 2, S/P  left atrial appendage clipping, S/P  foramen ovale closure, S/P aortic and mitral valve replacement with bioprosthetic, HLD, complete heart block S/P pacemaker, atrial flutter, obesity, prostate cancer, tobacco abuse  Presenting with dyspnea on exertion.   Patient reports about 3 days of worsening dyspnea on exertion.  Reports associated nausea and fatigue as well.  He also has had about a month of generalized weakness and decreased p.o. intake with weight loss.  Has an outpatient workup planned for this with GI to do a colonoscopy.   Reports some darker brown stools at times but denies any black stools no bloody stools and his most recent stools have been normal.  Denies fevers, chills, chest pain, abdominal pain, constipation, diarrhea.     ED Course: Vital signs in the ED notable for heart rate in the 90s to 100s, respiratory rate in the teens to 20s.  Lab workup included BMP with sodium 133, potassium 3.4, bicarb 21, glucose 120, calcium 8.0.  CBC with borderline leukocytosis at 10.5 and hemoglobin of 10 near baseline of 11-12 in the last couple weeks.  PT and INR elevated at 32.7-3.2 respectively on warfarin.  PTT 53.  Troponin elevated to 185 with repeat pending.  BNP borderline at 312.  Magnesium pending.  Chest x-ray showed no acute Gershon Mussel.  Patient received 40 mill equivalents p.o. potassium in the ED.  Cardiology consulted and recommended echocardiogram, troponin, holding warfarin in favor of heparin when INR less than 2 if procedure is needed.  They will consult.   Subjective: 4/15 afebrile overnight, A/O x 4, negative CP, negative SOB.   Assessment & Plan: Covid vaccination;   Principal Problem:   NSTEMI (non-ST elevated myocardial infarction) Active Problems:   S/P patent foramen ovale closure    Obesity (BMI 30-39.9)   Mixed hyperlipidemia   Coronary artery disease involving native coronary artery of native heart without angina pectoris   S/P aortic + mitral valve replacement with bioprosthetic valves   S/P CABG x 2   Complete heart block   Pacemaker   Malignant neoplasm of prostate   Atrial flutter  Dyspnea on exertion/NSTEMI/?Rule out CHF, ACS -On admission slightly elevated troponin 185 with repeat pending.  BNP borderline at 312. -Known history of CAD status post CABG x 2 - Trend troponin - Echocardiogram pending -Patient being transitioned to heparin in preparation for cardiac catheterization.  Awaiting INR,<2 Lab Results  Component Value Date   INR 2.9 (H) 05/24/2022   INR 3.0 (H) 05/24/2022   INR 3.2 (H) 05/23/2022   INR 4.5 (A) 05/12/2022   INR 3.6 (A) 04/13/2022  -Strict ins and out - Daily weight  Cardiomyopathy -S/p foramen ovale closure - S/p LAA clipping - S/p bioprosthetic replacement of aortic and mitral valve - See ACS   Complete heart block/Atrial flutter -Status post pacemaker.  Paced rhythm in the ED. - Continue home metoprolol - Transitioning warfarin to heparin as above   Hypokalemia -Potassium goal> 4 -K-Dur 40 mEq x 3 doses  Hypomagnesmia - Magnesium goal> 2 - Magnesium IV 2 g   Anemia unspecified (baseline 11-12)  - Anemia panel pending Lab Results  Component Value Date   HGB 10.5 (L) 05/24/2022   HGB 10.0 (L) 05/23/2022   HGB 11.8 (L) 05/05/2022   HGB  15.2 05/05/2021   HGB 14.6 04/30/2020   Fatigue/Weight loss - GI workup pending outpatient with colonoscopy. -Reports some darker brown stools at times but denies any black stools no bloody stools and his most recent stools have been normal.   Left knee effusion -LEFT appears to be, what may be consistent with, a partially thrombosed  pseudoaneurysm in the popliteal fossa/proximal calf area adjacent to an  effusion filled with mixed echoes. Further imaging should be  considered to  differentiate/classify findings.  -CT LEFT knee pending to further delineate   Hyperlipidemia -Lipitor 20 mg daily - Lipid panel pending   OBESITY(BMI 34.17 kg/m.)       Mobility Assessment (last 72 hours)     Mobility Assessment     Row Name 05/23/22 1728           Does patient have an order for bedrest or is patient medically unstable No - Continue assessment       What is the highest level of mobility based on the progressive mobility assessment? Level 3 (Stands with assist) - Balance while standing  and cannot march in place       Is the above level different from baseline mobility prior to current illness? Yes - Recommend PT order                      DVT prophylaxis: Warfarin, transitioning to heparin when INR less than 2  Code Status: Full  Family Communication:  Status is: Inpatient    Dispo: The patient is from: Home              Anticipated d/c is to: Home              Anticipated d/c date is: 2 days              Patient currently is not medically stable to d/c.      Consultants:  Cardiology  Procedures/Significant Events:  4/14 echocardiogram  Left Ventricle: LVEF= 55 to 60%.  -The interventricular septum is flattened in systole, consistent with right ventricular pressure overload and the interventricular septum is flattened in diastole ('D' shaped left ventricle), consistent with right ventricular volume overload.  -Left ventricular diastolic parameters are indeterminate.  -Right Ventricle: The right ventricular size is severely enlarged.Right ventricular  systolic function is moderately reduced.  -LEFT and RIGHT Atrium: severely dilated.  -Mitral Valve: There is at least moderate prosthetic valve stenosis  The mitral valve has been  repaired/replaced. There is a 27 mm Medtronic bioprosthetic valve present in the  mitral position.  -Tricuspid Valve: regurgitation is severe.  -Aortic Valve: aortic valve has been  repaired/replaced. There is a 23 mm bioprosthetic valve present  4/14 Doppler bilateral lower extremity - Negative DVT - LEFT appears to be, what may be consistent with, a partially thrombosed  pseudoaneurysm in the popliteal fossa/proximal calf area adjacent to an  effusion filled with mixed echoes. Further imaging should be considered to  differentiate/classify findings.    I have personally reviewed and interpreted all radiology studies and my findings are as above.  VENTILATOR SETTINGS:    Cultures   Antimicrobials:    Devices    LINES / TUBES:      Continuous Infusions:   Objective: Vitals:   05/23/22 1823 05/23/22 2001 05/24/22 0012 05/24/22 0353  BP:  115/74 103/70 124/78  Pulse:  98 (!) 102 (!) 102  Resp:   18 16  Temp:  98.6 F (37  C) 98 F (36.7 C) 98.4 F (36.9 C)  TempSrc:  Oral Oral Oral  SpO2:  95% 97% 97%  Weight: 101.7 kg   99 kg  Height:  (1.702 m)       Intake/Output Summary (Last 24 hours) at 05/24/2022 0741 Last data filed at 05/24/2022 0200 Gross per 24 hour  Intake 240 ml  Output 800 ml  Net -560 ml   Filed Weights   05/23/22 1823 05/24/22 0353  Weight: 101.7 kg 99 kg    Examination:  General: A/O x 4, No acute respiratory distress Eyes: negative scleral hemorrhage, negative anisocoria, negative icterus ENT: Negative Runny nose, negative gingival bleeding, Neck:  Negative scars, masses, torticollis, lymphadenopathy, JVD Lungs: Clear to auscultation bilaterally without wheezes or crackles Cardiovascular: Paced rhythm without murmur gallop or rub normal S1 and S2 Abdomen: OBESE, negative abdominal pain, nondistended, positive soft, bowel sounds, no rebound, no ascites, no appreciable mass Extremities: No significant cyanosis, clubbing, or edema bilateral lower extremities Skin: Negative rashes, lesions, ulcers Psychiatric:  Negative depression, negative anxiety, negative fatigue, negative mania  Central nervous system:   Cranial nerves II through XII intact, tongue/uvula midline, all extremities muscle strength 5/5, sensation intact throughout, negative dysarthria, negative expressive aphasia, negative receptive aphasia.  .     Data Reviewed: Care during the described time interval was provided by me .  I have reviewed this patient's available data, including medical history, events of note, physical examination, and all test results as part of my evaluation.  CBC: Recent Labs  Lab 05/23/22 1126  WBC 10.7*  HGB 10.0*  HCT 31.6*  MCV 81.7  PLT 156   Basic Metabolic Panel: Recent Labs  Lab 05/23/22 1126 05/23/22 1830  NA 133*  --   K 3.4*  --   CL 102  --   CO2 21*  --   GLUCOSE 128*  --   BUN 11  --   CREATININE 1.22  --   CALCIUM 8.0*  --   MG  --  1.9   GFR: Estimated Creatinine Clearance: 62.3 mL/min (by C-G formula based on SCr of 1.22 mg/dL). Liver Function Tests: No results for input(s): "AST", "ALT", "ALKPHOS", "BILITOT", "PROT", "ALBUMIN" in the last 168 hours. No results for input(s): "LIPASE", "AMYLASE" in the last 168 hours. No results for input(s): "AMMONIA" in the last 168 hours. Coagulation Profile: Recent Labs  Lab 05/23/22 1126  INR 3.2*   Cardiac Enzymes: No results for input(s): "CKTOTAL", "CKMB", "CKMBINDEX", "TROPONINI" in the last 168 hours. BNP (last 3 results) No results for input(s): "PROBNP" in the last 8760 hours. HbA1C: No results for input(s): "HGBA1C" in the last 72 hours. CBG: No results for input(s): "GLUCAP" in the last 168 hours. Lipid Profile: No results for input(s): "CHOL", "HDL", "LDLCALC", "TRIG", "CHOLHDL", "LDLDIRECT" in the last 72 hours. Thyroid Function Tests: No results for input(s): "TSH", "T4TOTAL", "FREET4", "T3FREE", "THYROIDAB" in the last 72 hours. Anemia Panel: No results for input(s): "VITAMINB12", "FOLATE", "FERRITIN", "TIBC", "IRON", "RETICCTPCT" in the last 72 hours. Sepsis Labs: No results for input(s): "PROCALCITON",  "LATICACIDVEN" in the last 168 hours.  No results found for this or any previous visit (from the past 240 hour(s)).       Radiology Studies: VAS [place] LOWER EXTREMITY VENOUS (DVT) (7a-7p)  Result Date: 05/23/2022  Lower Venous DVT Study Patient Name:  [name]  Date of Exam:   05/23/2022 Medical Rec #: [id number]       Accession #:  [id number] Date of Birth: [birth year]       Patient Gender: M Patient Age:   10 years Exam Location:  [facility] Procedure:      VAS [place] LOWER EXTREMITY VENOUS (DVT) Referring Phys: Apolinar Junes MORELLI --------------------------------------------------------------------------------  Indications: Left leg pain with walking. Other Indications: Patient had fluid drained off knee 05/05/22. Comparison Study: No prior study on file Performing Technologist: Sherren Kerns RVS  Examination Guidelines: A complete evaluation includes B-mode imaging, spectral Doppler, color Doppler, and power Doppler as needed of all accessible portions of each vessel. Bilateral testing is considered an integral part of a complete examination. Limited examinations for reoccurring indications may be performed as noted. The reflux portion of the exam is performed with the patient in reverse Trendelenburg.  +---------+---------------+---------+-----------+----------+--------------+ RIGHT    CompressibilityPhasicitySpontaneityPropertiesThrombus Aging +---------+---------------+---------+-----------+----------+--------------+ CFV      Full           Yes      Yes                                 +---------+---------------+---------+-----------+----------+--------------+ SFJ      Full                                                        +---------+---------------+---------+-----------+----------+--------------+ FV Prox  Full                                                        +---------+---------------+---------+-----------+----------+--------------+ FV Mid   Full            Yes      Yes                                 +---------+---------------+---------+-----------+----------+--------------+ FV DistalFull                                                        +---------+---------------+---------+-----------+----------+--------------+ PFV      Full                                                        +---------+---------------+---------+-----------+----------+--------------+ POP      Full           Yes      Yes                                 +---------+---------------+---------+-----------+----------+--------------+ PTV      Full                                                        +---------+---------------+---------+-----------+----------+--------------+  PERO     Full                                                        +---------+---------------+---------+-----------+----------+--------------+   +---------+---------------+---------+-----------+----------+--------------+ LEFT     CompressibilityPhasicitySpontaneityPropertiesThrombus Aging +---------+---------------+---------+-----------+----------+--------------+ CFV      Full           Yes      Yes                                 +---------+---------------+---------+-----------+----------+--------------+ SFJ      Full                                                        +---------+---------------+---------+-----------+----------+--------------+ FV Prox  Full                                                        +---------+---------------+---------+-----------+----------+--------------+ FV Mid   Full           Yes      Yes                                 +---------+---------------+---------+-----------+----------+--------------+ FV DistalFull                                                        +---------+---------------+---------+-----------+----------+--------------+ PFV      Full                                                         +---------+---------------+---------+-----------+----------+--------------+ POP      Full           Yes      Yes                                 +---------+---------------+---------+-----------+----------+--------------+ PTV      Full                                                        +---------+---------------+---------+-----------+----------+--------------+ PERO     Full                                                        +---------+---------------+---------+-----------+----------+--------------+  Summary: RIGHT: - There is no evidence of deep vein thrombosis in the lower extremity.  - No cystic structure found in the popliteal fossa.  LEFT: - There is no evidence of deep vein thrombosis in the lower extremity.  - There appears to be, what may be consistent with, a partially thrombosed pseudoaneurysm in the popliteal fossa/proximal calf area adjacent to an effusion filled with mixed echoes. Further imaging should be considered to differentiate/classify findings.  *See table(s) above for measurements and observations.    Preliminary    ECHOCARDIOGRAM COMPLETE  Result Date: 05/23/2022    ECHOCARDIOGRAM REPORT   Patient Name:   [name] Date of Exam: 05/23/2022 Medical Rec #:  [id number]      Height:       67.0 in Accession #:    [id number]     Weight:       223.0 lb Date of Birth:  1950-11-07      BSA:          2.118 m Patient Age:    71 years       BP:           106/76 mmHg Patient Gender: M              HR:           96 bpm. Exam Location:  Inpatient Procedure: 2D Echo, Cardiac Doppler, Color Doppler and Intracardiac            Opacification Agent Indications:    Dyspnea  History:        Patient has prior history of Echocardiogram examinations, most                 recent 06/13/2019. Aortic Valve Disease and Mitral Valve Disease.                 23mm Edwards Inspiris Resilia AVR, 27mm Bovine AVR.  Sonographer:    Milbert Coulter Referring Phys: [id number] Cecille Po MELVIN   Sonographer Comments: No subcostal window. Image acquisition challenging due to patient body habitus. IMPRESSIONS  1. Right ventricular systolic function is moderately reduced. The right ventricular size is severely enlarged.  2. Left ventricular ejection fraction, by estimation, is 55 to 60%. The left ventricle has normal function. The left ventricle has no regional wall motion abnormalities. Left ventricular diastolic parameters are indeterminate. There is the interventricular septum is flattened in systole, consistent with right ventricular pressure overload and the interventricular septum is flattened in diastole ('D' shaped left ventricle), consistent with right ventricular volume overload.  3. The mitral valve has been repaired/replaced. Trivial mitral valve regurgitation. There is at least moderate mitral valve prosthetic stenosis with a gradient is 11.0 mmHg at HR 95bpm.  4. Tricuspid valve regurgitation is severe.  5. The aortic valve has been repaired/replaced. Aortic valve regurgitation is not visualized. Echo findings are consistent with normal structure and function of the aortic valve prosthesis. Aortic valve mean gradient measures 7.0 mmHg. Aortic valve Vmax  measures 1.78 m/s. DI 0.5.  6. Left atrial size was severely dilated.  7. Right atrial size was severely dilated. Comparison(s): Compared to prior TTE in 06/2019, the RV is now severely dilated and moderately hypokinetic with severe TR. FINDINGS  Left Ventricle: Left ventricular ejection fraction, by estimation, is 55 to 60%. The left ventricle has normal function. The left ventricle has no regional wall motion abnormalities. Definity contrast agent was given IV to delineate the left ventricular  endocardial borders. The left  ventricular internal cavity size was small. There is no left ventricular hypertrophy. The interventricular septum is flattened in systole, consistent with right ventricular pressure overload and the interventricular septum is  flattened in diastole ('D' shaped left ventricle), consistent with right ventricular volume overload. Left ventricular diastolic parameters are indeterminate. Right Ventricle: The right ventricular size is severely enlarged. Right vetricular wall thickness was not well visualized. Right ventricular systolic function is moderately reduced. Left Atrium: Left atrial size was severely dilated. Right Atrium: Right atrial size was severely dilated. Pericardium: There is no evidence of pericardial effusion. Mitral Valve: There is at least moderate prosthetic valve stenosis with mean gradient at HR 95bpm. The mitral valve has been repaired/replaced. Trivial mitral valve regurgitation. There is a 27 mm Medtronic bioprosthetic valve present in the mitral position. MV peak gradient, 16.8 mmHg. The mean mitral valve gradient is 11.0 mmHg. Tricuspid Valve: The tricuspid valve is grossly normal. Tricuspid valve regurgitation is severe. Aortic Valve: DI 0.51. The aortic valve has been repaired/replaced. Aortic valve regurgitation is not visualized. Aortic valve mean gradient measures 7.0 mmHg. Aortic valve peak gradient measures 12.7 mmHg. There is a 23 mm bioprosthetic valve present in  the aortic position. Echo findings are consistent with normal structure and function of the aortic valve prosthesis. Pulmonic Valve: The pulmonic valve was normal in structure. Pulmonic valve regurgitation is trivial. Aorta: The aortic root is normal in size and structure. IAS/Shunts: The atrial septum is grossly normal. Additional Comments: A device lead is visualized.  LEFT VENTRICLE PLAX 2D LVIDd:         3.90 cm LVIDs:         3.20 cm LV PW:         1.10 cm LV IVS:        1.00 cm  RIGHT VENTRICLE RV S prime:     7.07 cm/s TAPSE (M-mode): 1.1 cm LEFT ATRIUM         Index LA diam:    5.70 cm 2.69 cm/m  AORTIC VALVE AV Vmax:           178.00 cm/s AV Vmean:          121.500 cm/s AV VTI:            0.292 m AV Peak Grad:      12.7 mmHg AV  Mean Grad:      7.0 mmHg LVOT Vmax:         98.00 cm/s LVOT Vmean:        66.600 cm/s LVOT VTI:          0.158 m LVOT/AV VTI ratio: 0.54 MITRAL VALVE MV Peak grad: 16.8 mmHg  SHUNTS MV Mean grad: 11.0 mmHg  Systemic VTI: 0.16 m MV Vmax:      2.05 m/s MV Vmean:     161.0 cm/s Laurance Flatten MD Electronically signed by Laurance Flatten MD Signature Date/Time: 05/23/2022/5:39:50 PM    Final    DG Chest Port 1 View  Result Date: 05/23/2022 CLINICAL DATA:  Dyspnea on exertion. EXAM: PORTABLE CHEST 1 VIEW COMPARISON:  May 29, 2019. FINDINGS: Stable cardiomediastinal silhouette. Lungs are clear. Left-sided pacemaker is unchanged. Sternotomy wires are noted. Bony thorax is unremarkable. IMPRESSION: No active disease. Electronically Signed   By: Lupita Raider M.D.   On: 05/23/2022 11:52        Scheduled Meds:  atorvastatin  20 mg Oral Daily   metoprolol tartrate  25 mg Oral BID   sodium chloride flush  3 mL Intravenous Q12H   Continuous Infusions:   LOS: 0 days    Time spent:40 min    Bensen Chadderdon, Roselind Messier, MD [facility]   If 7PM-7AM, please contact night-coverage 05/24/2022, 7:41 AM

## 2022-05-25 ENCOUNTER — Inpatient Hospital Stay (HOSPITAL_COMMUNITY)
Admission: EM | Disposition: A | Discharge status: Home Health | Payer: Self-pay | Source: Home / Self Care | Attending: Internal Medicine

## 2022-05-25 DIAGNOSIS — R0609 Other forms of dyspnea: Secondary | ICD-10-CM

## 2022-05-25 DIAGNOSIS — E782 Mixed hyperlipidemia: Secondary | ICD-10-CM | POA: Diagnosis present

## 2022-05-25 DIAGNOSIS — I4892 Unspecified atrial flutter: Secondary | ICD-10-CM | POA: Diagnosis not present

## 2022-05-25 DIAGNOSIS — E871 Hypo-osmolality and hyponatremia: Secondary | ICD-10-CM | POA: Diagnosis present

## 2022-05-25 DIAGNOSIS — I214 Non-ST elevation (NSTEMI) myocardial infarction: Secondary | ICD-10-CM | POA: Diagnosis not present

## 2022-05-25 DIAGNOSIS — D638 Anemia in other chronic diseases classified elsewhere: Secondary | ICD-10-CM | POA: Diagnosis present

## 2022-05-25 DIAGNOSIS — I2510 Atherosclerotic heart disease of native coronary artery without angina pectoris: Secondary | ICD-10-CM | POA: Diagnosis present

## 2022-05-25 DIAGNOSIS — K219 Gastro-esophageal reflux disease without esophagitis: Secondary | ICD-10-CM | POA: Diagnosis present

## 2022-05-25 DIAGNOSIS — M25462 Effusion, left knee: Secondary | ICD-10-CM | POA: Diagnosis present

## 2022-05-25 DIAGNOSIS — Z8774 Personal history of (corrected) congenital malformations of heart and circulatory system: Secondary | ICD-10-CM | POA: Diagnosis not present

## 2022-05-25 DIAGNOSIS — Z87891 Personal history of nicotine dependence: Secondary | ICD-10-CM | POA: Diagnosis not present

## 2022-05-25 DIAGNOSIS — Z950 Presence of cardiac pacemaker: Secondary | ICD-10-CM | POA: Diagnosis not present

## 2022-05-25 DIAGNOSIS — Z8546 Personal history of malignant neoplasm of prostate: Secondary | ICD-10-CM | POA: Diagnosis not present

## 2022-05-25 DIAGNOSIS — I5082 Biventricular heart failure: Secondary | ICD-10-CM | POA: Diagnosis present

## 2022-05-25 DIAGNOSIS — I2489 Other forms of acute ischemic heart disease: Secondary | ICD-10-CM

## 2022-05-25 DIAGNOSIS — I442 Atrioventricular block, complete: Secondary | ICD-10-CM | POA: Diagnosis present

## 2022-05-25 DIAGNOSIS — I50810 Right heart failure, unspecified: Secondary | ICD-10-CM

## 2022-05-25 DIAGNOSIS — I5033 Acute on chronic diastolic (congestive) heart failure: Secondary | ICD-10-CM | POA: Diagnosis present

## 2022-05-25 DIAGNOSIS — Z85828 Personal history of other malignant neoplasm of skin: Secondary | ICD-10-CM | POA: Diagnosis not present

## 2022-05-25 DIAGNOSIS — E876 Hypokalemia: Secondary | ICD-10-CM | POA: Diagnosis present

## 2022-05-25 DIAGNOSIS — Z79899 Other long term (current) drug therapy: Secondary | ICD-10-CM | POA: Diagnosis not present

## 2022-05-25 DIAGNOSIS — R634 Abnormal weight loss: Secondary | ICD-10-CM | POA: Diagnosis present

## 2022-05-25 DIAGNOSIS — I429 Cardiomyopathy, unspecified: Secondary | ICD-10-CM | POA: Diagnosis present

## 2022-05-25 DIAGNOSIS — Z953 Presence of xenogenic heart valve: Secondary | ICD-10-CM | POA: Diagnosis not present

## 2022-05-25 DIAGNOSIS — Z951 Presence of aortocoronary bypass graft: Secondary | ICD-10-CM | POA: Diagnosis not present

## 2022-05-25 DIAGNOSIS — E669 Obesity, unspecified: Secondary | ICD-10-CM | POA: Diagnosis present

## 2022-05-25 DIAGNOSIS — R627 Adult failure to thrive: Secondary | ICD-10-CM | POA: Diagnosis present

## 2022-05-25 DIAGNOSIS — I083 Combined rheumatic disorders of mitral, aortic and tricuspid valves: Secondary | ICD-10-CM | POA: Diagnosis present

## 2022-05-25 HISTORY — PX: RIGHT/LEFT HEART CATH AND CORONARY/GRAFT ANGIOGRAPHY: CATH118267

## 2022-05-25 LAB — POCT I-STAT EG7
Acid-base deficit: 2 mmol/L (ref 0.0–2.0)
Bicarbonate: 22 mmol/L (ref 20.0–28.0)
Calcium, Ion: 1.01 mmol/L — ABNORMAL LOW (ref 1.15–1.40)
HCT: 32 % — ABNORMAL LOW (ref 39.0–52.0)
Hemoglobin: 10.9 g/dL — ABNORMAL LOW (ref 13.0–17.0)
O2 Saturation: 69 %
Potassium: 3.7 mmol/L (ref 3.5–5.1)
Sodium: 140 mmol/L (ref 135–145)
TCO2: 23 mmol/L (ref 22–32)
pCO2, Ven: 34 mmHg — ABNORMAL LOW (ref 44–60)
pH, Ven: 7.419 (ref 7.25–7.43)
pO2, Ven: 35 mmHg (ref 32–45)

## 2022-05-25 LAB — CBC WITH DIFFERENTIAL/PLATELET
Abs Immature Granulocytes: 0.07 10*3/uL (ref 0.00–0.07)
Basophils Absolute: 0 10*3/uL (ref 0.0–0.1)
Basophils Relative: 0 %
Eosinophils Absolute: 0 10*3/uL (ref 0.0–0.5)
Eosinophils Relative: 0 %
HCT: 35.7 % — ABNORMAL LOW (ref 39.0–52.0)
Hemoglobin: 11.3 g/dL — ABNORMAL LOW (ref 13.0–17.0)
Immature Granulocytes: 1 %
Lymphocytes Relative: 8 %
Lymphs Abs: 0.7 10*3/uL (ref 0.7–4.0)
MCH: 25.8 pg — ABNORMAL LOW (ref 26.0–34.0)
MCHC: 31.7 g/dL (ref 30.0–36.0)
MCV: 81.5 fL (ref 80.0–100.0)
Monocytes Absolute: 0.7 10*3/uL (ref 0.1–1.0)
Monocytes Relative: 8 %
Neutro Abs: 7.3 10*3/uL (ref 1.7–7.7)
Neutrophils Relative %: 83 %
Platelets: 145 10*3/uL — ABNORMAL LOW (ref 150–400)
RBC: 4.38 MIL/uL (ref 4.22–5.81)
RDW: 16.3 % — ABNORMAL HIGH (ref 11.5–15.5)
WBC: 8.8 10*3/uL (ref 4.0–10.5)
nRBC: 0 % (ref 0.0–0.2)

## 2022-05-25 LAB — COMPREHENSIVE METABOLIC PANEL
ALT: 18 U/L (ref 0–44)
AST: 31 U/L (ref 15–41)
Albumin: 2.6 g/dL — ABNORMAL LOW (ref 3.5–5.0)
Alkaline Phosphatase: 93 U/L (ref 38–126)
Anion gap: 9 (ref 5–15)
BUN: 11 mg/dL (ref 8–23)
CO2: 20 mmol/L — ABNORMAL LOW (ref 22–32)
Calcium: 8.4 mg/dL — ABNORMAL LOW (ref 8.9–10.3)
Chloride: 103 mmol/L (ref 98–111)
Creatinine, Ser: 0.92 mg/dL (ref 0.61–1.24)
GFR, Estimated: >60 mL/min (ref >60)
Glucose, Bld: 104 mg/dL — ABNORMAL HIGH (ref 70–99)
Potassium: 4.5 mmol/L (ref 3.5–5.1)
Sodium: 132 mmol/L — ABNORMAL LOW (ref 135–145)
Total Bilirubin: 1.6 mg/dL — ABNORMAL HIGH (ref 0.3–1.2)
Total Protein: 6.8 g/dL (ref 6.5–8.1)

## 2022-05-25 LAB — MAGNESIUM: Magnesium: 2.6 mg/dL — ABNORMAL HIGH (ref 1.7–2.4)

## 2022-05-25 LAB — LIPID PANEL
Cholesterol: 77 mg/dL (ref 0–200)
HDL: 25 mg/dL — ABNORMAL LOW (ref >40)
LDL Cholesterol: 37 mg/dL (ref 0–99)
Total CHOL/HDL Ratio: 3.1 RATIO
Triglycerides: 73 mg/dL (ref <150)
VLDL: 15 mg/dL (ref 0–40)

## 2022-05-25 LAB — PROTIME-INR
INR: 2 — ABNORMAL HIGH (ref 0.8–1.2)
INR: 1.7 — ABNORMAL HIGH (ref 0.8–1.2)
Prothrombin Time: 22.1 seconds — ABNORMAL HIGH (ref 11.4–15.2)
Prothrombin Time: 20 seconds — ABNORMAL HIGH (ref 11.4–15.2)

## 2022-05-25 LAB — PHOSPHORUS: Phosphorus: 3.3 mg/dL (ref 2.5–4.6)

## 2022-05-25 SURGERY — RIGHT/LEFT HEART CATH AND CORONARY/GRAFT ANGIOGRAPHY
Anesthesia: LOCAL

## 2022-05-25 MED ORDER — LIDOCAINE HCL (PF) 1 % IJ SOLN
INTRAMUSCULAR | Status: AC
  Filled 2022-05-25: qty 30

## 2022-05-25 MED ORDER — MIDAZOLAM HCL 2 MG/2ML IJ SOLN
INTRAMUSCULAR | Status: AC
  Filled 2022-05-25: qty 2

## 2022-05-25 MED ORDER — FENTANYL CITRATE (PF) 100 MCG/2ML IJ SOLN
INTRAMUSCULAR | Status: DC | PRN
  Administered 2022-05-25 (×3): 25 ug via INTRAVENOUS

## 2022-05-25 MED ORDER — VERAPAMIL HCL 2.5 MG/ML IV SOLN
INTRAVENOUS | Status: AC
  Filled 2022-05-25: qty 2

## 2022-05-25 MED ORDER — FENTANYL CITRATE (PF) 100 MCG/2ML IJ SOLN
INTRAMUSCULAR | Status: AC
  Filled 2022-05-25: qty 2

## 2022-05-25 MED ORDER — HEPARIN SODIUM (PORCINE) 1000 UNIT/ML IJ SOLN
INTRAMUSCULAR | Status: AC
  Filled 2022-05-25: qty 10

## 2022-05-25 MED ORDER — HYDRALAZINE HCL 20 MG/ML IJ SOLN: 10.0000 mg | INTRAMUSCULAR | Status: AC | PRN

## 2022-05-25 MED ORDER — SODIUM CHLORIDE 0.9 % IV SOLN: INTRAVENOUS | Status: AC

## 2022-05-25 MED ORDER — IOHEXOL 350 MG/ML SOLN
INTRAVENOUS | Status: DC | PRN
  Administered 2022-05-25: 180 mL

## 2022-05-25 MED ORDER — HEPARIN (PORCINE) 25000 UT/250ML-% IV SOLN
1350.0000 [IU]/h | INTRAVENOUS | Status: DC
  Administered 2022-05-25: 1100 [IU]/h via INTRAVENOUS
  Filled 2022-05-25: qty 250

## 2022-05-25 MED ORDER — MIDAZOLAM HCL 2 MG/2ML IJ SOLN
INTRAMUSCULAR | Status: DC | PRN
  Administered 2022-05-25 (×3): 1 mg via INTRAVENOUS

## 2022-05-25 MED ORDER — SODIUM CHLORIDE 0.9 % IV SOLN: 250.0000 mL | INTRAVENOUS | Status: DC | PRN

## 2022-05-25 MED ORDER — LIDOCAINE HCL (PF) 1 % IJ SOLN
INTRAMUSCULAR | Status: DC | PRN
  Administered 2022-05-25: 2 mL
  Administered 2022-05-25: 5 mL
  Administered 2022-05-25: 2 mL

## 2022-05-25 MED ORDER — HEPARIN (PORCINE) IN NACL 1000-0.9 UT/500ML-% IV SOLN
INTRAVENOUS | Status: DC | PRN
  Administered 2022-05-25 (×2): 500 mL

## 2022-05-25 SURGICAL SUPPLY — 16 items
CATH BALLN WEDGE 5F 110CM (CATHETERS) IMPLANT
CATH INFINITI 5 FR RCB (CATHETERS) IMPLANT
CATH INFINITI 5FR MULTPACK ANG (CATHETERS) IMPLANT
CLOSURE MYNX CONTROL 5F (Vascular Products) IMPLANT
GLIDESHEATH SLEND SS 6F .021 (SHEATH) IMPLANT
GUIDEWIRE .025 260CM (WIRE) IMPLANT
GUIDEWIRE INQWIRE 1.5J.035X260 (WIRE) IMPLANT
INQWIRE 1.5J .035X260CM (WIRE) ×1
KIT HEART LEFT (KITS) ×1 IMPLANT
PACK CARDIAC CATHETERIZATION (CUSTOM PROCEDURE TRAY) ×1 IMPLANT
SHEATH GLIDE SLENDER 4/5FR (SHEATH) IMPLANT
SHEATH PINNACLE 5F 10CM (SHEATH) IMPLANT
SHEATH PROBE COVER 6X72 (BAG) IMPLANT
TRANSDUCER W/STOPCOCK (MISCELLANEOUS) ×1 IMPLANT
TUBING CIL FLEX 10 FLL-RA (TUBING) ×1 IMPLANT
WIRE MICRO SET SILHO 5FR 7 (SHEATH) IMPLANT

## 2022-05-25 NOTE — H&P (View-Only) (Signed)
 Rounding Note    Patient Name: [name] Date of Encounter: 05/25/2022  [facility] Cardiologist: Toure Edmonds, MD   Subjective   Feeling well, no chest pain or dyspnea this morning.   Inpatient Medications    Scheduled Meds:  atorvastatin  20 mg Oral Daily   metoprolol tartrate  25 mg Oral BID   sodium chloride flush  3 mL Intravenous Q12H   sodium chloride flush  3 mL Intravenous Q12H   Continuous Infusions:  sodium chloride     sodium chloride 10 mL/hr at 05/25/22 0537   PRN Meds: sodium chloride, acetaminophen **OR** acetaminophen, polyethylene glycol, sodium chloride flush   Vital Signs    Vitals:   05/25/22 0324 05/25/22 0543 05/25/22 0736 05/25/22 0853  BP: 118/75  112/80   Pulse: 100  98   Resp: 18 18 17   Temp: 98.7 F (37.1 C) 99 F (37.2 C) 98.7 F (37.1 C)   TempSrc: Oral Oral Oral   SpO2: 94%  97% 95%  Weight: 96.9 kg 96.6 kg    Height:        Intake/Output Summary (Last 24 hours) at 05/25/2022 0925 Last data filed at 05/25/2022 0327 Gross per 24 hour  Intake 200 ml  Output 1540 ml  Net -1340 ml      05/25/2022    5:43 AM 05/25/2022    3:24 AM 05/24/2022    3:53 AM  Last 3 Weights  Weight (lbs) 212 lb 14.4 oz 213 lb 9.6 oz 218 lb 3.2 oz  Weight (kg) 96.571 kg 96.888 kg 98.975 kg      Telemetry    Sinus Rhythm, intermittent AV/V pacing - Personally Reviewed  ECG    No new tracing  Physical Exam   GEN: No acute distress.   Neck: No JVD Cardiac: RRR, + systolic murmur, no rubs, or gallops.  Respiratory: Clear to auscultation bilaterally. GI: Soft, nontender, non-distended  MS: No edema; No deformity. Neuro:  Nonfocal  Psych: Normal affect   Labs    High Sensitivity Troponin:   Recent Labs  Lab 05/23/22 1126 05/23/22 1351 05/24/22 0826 05/24/22 1016  TROPONINIHS 185* 178* 88* 72*     Chemistry Recent Labs  Lab 05/23/22 1830 05/24/22 0742 05/24/22 0826 05/25/22 0219  NA  --  130* 131* 132*   K  --  3.4* 3.4* 4.5  CL  --  102 101 103  CO2  --  21* 20* 20*  GLUCOSE  --  166* 156* 104*  BUN  --  10 10 11  CREATININE  --  0.95 0.92 0.92  CALCIUM  --  8.0* 8.0* 8.4*  MG 1.9  --  1.9 2.6*  PROT  --  6.0* 6.0* 6.8  ALBUMIN  --  2.5* 2.3* 2.6*  AST  --  26 27 31  ALT  --  19 17 18  ALKPHOS  --  84 76 93  BILITOT  --  1.6* 1.3* 1.6*  GFRNONAA  --  >60 >60 >60  ANIONGAP  --  7 10 9    Lipids  Recent Labs  Lab 05/25/22 0219  CHOL 77  TRIG 73  HDL 25*  LDLCALC 37  CHOLHDL 3.1    Hematology Recent Labs  Lab 05/24/22 0742 05/24/22 0826 05/25/22 0219  WBC 8.7 8.0 8.8  RBC 4.03* 3.83*  3.78* 4.38  HGB 10.5* 9.7* 11.3*  HCT 32.4* 31.2* 35.7*  MCV 80.4 81.5 81.5  MCH 26.1 25.3*   25.8*  MCHC 32.4 31.1 31.7  RDW 16.4* 16.4* 16.3*  PLT 139* 134* 145*   Thyroid No results for input(s): "TSH", "FREET4" in the last 168 hours.  BNP Recent Labs  Lab 05/23/22 1126  BNP 312.7*    DDimer No results for input(s): "DDIMER" in the last 168 hours.   Radiology    CT KNEE LEFT W CONTRAST  Result Date: 05/24/2022 CLINICAL DATA:  Left leg pain and weakness. Had a knee aspiration several weeks ago. Possible pseudoaneurysm seen in the popliteal fossa on DVT study. EXAM: CT OF THE LEFT KNEE WITH CONTRAST TECHNIQUE: Multidetector CT imaging was performed following the standard protocol during bolus administration of intravenous contrast. RADIATION DOSE REDUCTION: This exam was performed according to the departmental dose-optimization program which includes automated exposure control, adjustment of the mA and/or kV according to patient size and/or use of iterative reconstruction technique. CONTRAST:  100mL OMNIPAQUE IOHEXOL 350 MG/ML SOLN COMPARISON:  None Available. FINDINGS: Bones/Joint/Cartilage No fracture or dislocation. Moderate to severe medial compartment joint space narrowing with near bone-on-bone apposition medially. The patellofemoral and lateral compartment joint spaces are  relatively preserved. Small to moderate joint effusion. Fluid extends into the popliteus tendon sheath. No Baker cyst. Ligaments Ligaments are suboptimally evaluated by CT. Muscles and Tendons Grossly intact.  No significant muscle atrophy. Soft tissue 2.3 x 1.9 x 2.1 cm cystic lesion with rim enhancement posterior to the lateral femoral condyle and lateral to the popliteal vessels, likely a ganglion cyst. No pseudoaneurysm. No worrisome soft tissue mass. IMPRESSION: 1. Moderate to severe medial compartment osteoarthritis with small to moderate joint effusion. 2. 2.3 cm ganglion cyst posterior to the lateral femoral condyle and lateral to the popliteal vessels. No pseudoaneurysm or worrisome soft tissue mass. Electronically Signed   By: William T Derry M.D.   On: 05/24/2022 09:18   VAS US LOWER EXTREMITY VENOUS (DVT) (7a-7p)  Result Date: 05/24/2022  Lower Venous DVT Study Patient Name:  [name]  Date of Exam:   05/23/2022 Medical Rec #: [id number]       Accession #:    [id number] Date of Birth: 12/03/1950       Patient Gender: M Patient Age:   71 years Exam Location:  [facility] Procedure:      VAS US LOWER EXTREMITY VENOUS (DVT) Referring Phys: BRANDON MORELLI --------------------------------------------------------------------------------  Indications: Left leg pain with walking. Other Indications: Patient had fluid drained off knee 05/05/22. Comparison Study: No prior study on file Performing Technologist: Candace Kanady RVS  Examination Guidelines: A complete evaluation includes B-mode imaging, spectral Doppler, color Doppler, and power Doppler as needed of all accessible portions of each vessel. Bilateral testing is considered an integral part of a complete examination. Limited examinations for reoccurring indications may be performed as noted. The reflux portion of the exam is performed with the patient in reverse Trendelenburg.   +---------+---------------+---------+-----------+----------+--------------+ RIGHT    CompressibilityPhasicitySpontaneityPropertiesThrombus Aging +---------+---------------+---------+-----------+----------+--------------+ CFV      Full           Yes      Yes                                 +---------+---------------+---------+-----------+----------+--------------+ SFJ      Full                                                        +---------+---------------+---------+-----------+----------+--------------+   FV Prox  Full                                                        +---------+---------------+---------+-----------+----------+--------------+ FV Mid   Full           Yes      Yes                                 +---------+---------------+---------+-----------+----------+--------------+ FV DistalFull                                                        +---------+---------------+---------+-----------+----------+--------------+ PFV      Full                                                        +---------+---------------+---------+-----------+----------+--------------+ POP      Full           Yes      Yes                                 +---------+---------------+---------+-----------+----------+--------------+ PTV      Full                                                        +---------+---------------+---------+-----------+----------+--------------+ PERO     Full                                                        +---------+---------------+---------+-----------+----------+--------------+   +---------+---------------+---------+-----------+----------+--------------+ LEFT     CompressibilityPhasicitySpontaneityPropertiesThrombus Aging +---------+---------------+---------+-----------+----------+--------------+ CFV      Full           Yes      Yes                                  +---------+---------------+---------+-----------+----------+--------------+ SFJ      Full                                                        +---------+---------------+---------+-----------+----------+--------------+ FV Prox  Full                                                        +---------+---------------+---------+-----------+----------+--------------+   FV Mid   Full           Yes      Yes                                 +---------+---------------+---------+-----------+----------+--------------+ FV DistalFull                                                        +---------+---------------+---------+-----------+----------+--------------+ PFV      Full                                                        +---------+---------------+---------+-----------+----------+--------------+ POP      Full           Yes      Yes                                 +---------+---------------+---------+-----------+----------+--------------+ PTV      Full                                                        +---------+---------------+---------+-----------+----------+--------------+ PERO     Full                                                        +---------+---------------+---------+-----------+----------+--------------+     Summary: RIGHT: - There is no evidence of deep vein thrombosis in the lower extremity.  - No cystic structure found in the popliteal fossa.  LEFT: - There is no evidence of deep vein thrombosis in the lower extremity.  - There appears to be, what may be consistent with, a partially thrombosed pseudoaneurysm in the popliteal fossa/proximal calf area adjacent to an effusion filled with mixed echoes. Further imaging should be considered to differentiate/classify findings.  *See table(s) above for measurements and observations. Electronically signed by Vance Brabham MD on 05/24/2022 at 8:05:33 AM.    Final    ECHOCARDIOGRAM COMPLETE  Result Date:  05/23/2022    ECHOCARDIOGRAM REPORT   Patient Name:   Indie A Hornberger Date of Exam: 05/23/2022 Medical Rec #:  [id number]      Height:       67.0 in Accession #:    [id number]     Weight:       223.0 lb Date of Birth:  07/24/1950      BSA:          2.118 m Patient Age:    71 years       BP:           106/76 mmHg Patient Gender: M              HR:             96 bpm. Exam Location:  Inpatient Procedure: 2D Echo, Cardiac Doppler, Color Doppler and Intracardiac            Opacification Agent Indications:    Dyspnea  History:        Patient has prior history of Echocardiogram examinations, most                 recent 06/13/2019. Aortic Valve Disease and Mitral Valve Disease.                 23mm Edwards Inspiris Resilia AVR, 27mm Bovine AVR.  Sonographer:    Melissa Kafa Referring Phys: [id number] ALEXANDER B MELVIN  Sonographer Comments: No subcostal window. Image acquisition challenging due to patient body habitus. IMPRESSIONS  1. Right ventricular systolic function is moderately reduced. The right ventricular size is severely enlarged.  2. Left ventricular ejection fraction, by estimation, is 55 to 60%. The left ventricle has normal function. The left ventricle has no regional wall motion abnormalities. Left ventricular diastolic parameters are indeterminate. There is the interventricular septum is flattened in systole, consistent with right ventricular pressure overload and the interventricular septum is flattened in diastole ('D' shaped left ventricle), consistent with right ventricular volume overload.  3. The mitral valve has been repaired/replaced. Trivial mitral valve regurgitation. There is at least moderate mitral valve prosthetic stenosis with a gradient is 11.0 mmHg at HR 95bpm.  4. Tricuspid valve regurgitation is severe.  5. The aortic valve has been repaired/replaced. Aortic valve regurgitation is not visualized. Echo findings are consistent with normal structure and function of the aortic valve prosthesis. Aortic  valve mean gradient measures 7.0 mmHg. Aortic valve Vmax  measures 1.78 m/s. DI 0.5.  6. Left atrial size was severely dilated.  7. Right atrial size was severely dilated. Comparison(s): Compared to prior TTE in 06/2019, the RV is now severely dilated and moderately hypokinetic with severe TR. FINDINGS  Left Ventricle: Left ventricular ejection fraction, by estimation, is 55 to 60%. The left ventricle has normal function. The left ventricle has no regional wall motion abnormalities. Definity contrast agent was given IV to delineate the left ventricular  endocardial borders. The left ventricular internal cavity size was small. There is no left ventricular hypertrophy. The interventricular septum is flattened in systole, consistent with right ventricular pressure overload and the interventricular septum is flattened in diastole ('D' shaped left ventricle), consistent with right ventricular volume overload. Left ventricular diastolic parameters are indeterminate. Right Ventricle: The right ventricular size is severely enlarged. Right vetricular wall thickness was not well visualized. Right ventricular systolic function is moderately reduced. Left Atrium: Left atrial size was severely dilated. Right Atrium: Right atrial size was severely dilated. Pericardium: There is no evidence of pericardial effusion. Mitral Valve: There is at least moderate prosthetic valve stenosis with mean gradient 11mmHg at HR 95bpm. The mitral valve has been repaired/replaced. Trivial mitral valve regurgitation. There is a 27 mm Medtronic bioprosthetic valve present in the mitral position. MV peak gradient, 16.8 mmHg. The mean mitral valve gradient is 11.0 mmHg. Tricuspid Valve: The tricuspid valve is grossly normal. Tricuspid valve regurgitation is severe. Aortic Valve: DI 0.51. The aortic valve has been repaired/replaced. Aortic valve regurgitation is not visualized. Aortic valve mean gradient measures 7.0 mmHg. Aortic valve peak gradient  measures 12.7 mmHg. There is a 23 mm bioprosthetic valve present in  the aortic position. Echo findings are consistent with normal structure and function of the aortic valve prosthesis. Pulmonic Valve: The pulmonic valve was normal in   structure. Pulmonic valve regurgitation is trivial. Aorta: The aortic root is normal in size and structure. IAS/Shunts: The atrial septum is grossly normal. Additional Comments: A device lead is visualized.  LEFT VENTRICLE PLAX 2D LVIDd:         3.90 cm LVIDs:         3.20 cm LV PW:         1.10 cm LV IVS:        1.00 cm  RIGHT VENTRICLE RV S prime:     7.07 cm/s TAPSE (M-mode): 1.1 cm LEFT ATRIUM         Index LA diam:    5.70 cm 2.69 cm/m  AORTIC VALVE AV Vmax:           178.00 cm/s AV Vmean:          121.500 cm/s AV VTI:            0.292 m AV Peak Grad:      12.7 mmHg AV Mean Grad:      7.0 mmHg LVOT Vmax:         98.00 cm/s LVOT Vmean:        66.600 cm/s LVOT VTI:          0.158 m LVOT/AV VTI ratio: 0.54 MITRAL VALVE MV Peak grad: 16.8 mmHg  SHUNTS MV Mean grad: 11.0 mmHg  Systemic VTI: 0.16 m MV Vmax:      2.05 m/s MV Vmean:     161.0 cm/s Heather Pemberton MD Electronically signed by Heather Pemberton MD Signature Date/Time: 05/23/2022/5:39:50 PM    Final    DG Chest Port 1 View  Result Date: 05/23/2022 CLINICAL DATA:  Dyspnea on exertion. EXAM: PORTABLE CHEST 1 VIEW COMPARISON:  May 29, 2019. FINDINGS: Stable cardiomediastinal silhouette. Lungs are clear. Left-sided pacemaker is unchanged. Sternotomy wires are noted. Bony thorax is unremarkable. IMPRESSION: No active disease. Electronically Signed   By: James  Green Jr M.D.   On: 05/23/2022 11:52    Cardiac Studies   Echo: 05/23/2022  IMPRESSIONS     1. Right ventricular systolic function is moderately reduced. The right  ventricular size is severely enlarged.   2. Left ventricular ejection fraction, by estimation, is 55 to 60%. The  left ventricle has normal function. The left ventricle has no regional  wall  motion abnormalities. Left ventricular diastolic parameters are  indeterminate. There is the  interventricular septum is flattened in systole, consistent with right  ventricular pressure overload and the interventricular septum is flattened  in diastole ('D' shaped left ventricle), consistent with right ventricular  volume overload.   3. The mitral valve has been repaired/replaced. Trivial mitral valve  regurgitation. 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   4. Tricuspid valve regurgitation is severe.   5. The aortic valve has been repaired/replaced. Aortic valve  regurgitation is not visualized. Echo findings are consistent with normal  structure and function of the aortic valve prosthesis. Aortic valve mean  gradient measures 7.0 mmHg. Aortic valve Vmax   measures 1.78 m/s. DI 0.5.   6. Left atrial size was severely dilated.   7. Right atrial size was severely dilated.   Comparison(s): Compared to prior TTE in 06/2019, the RV is now severely  dilated and moderately hypokinetic with severe TR.   FINDINGS   Left Ventricle: Left ventricular ejection fraction, by estimation, is 55  to 60%. The left ventricle has normal function.   The left ventricle has no  regional wall motion abnormalities. Definity contrast agent was given IV  to delineate the left ventricular   endocardial borders. The left ventricular internal cavity size was small.  There is no left ventricular hypertrophy. The interventricular septum is  flattened in systole, consistent with right ventricular pressure overload  and the interventricular septum  is flattened in diastole ('D' shaped left ventricle), consistent with  right ventricular volume overload. Left ventricular diastolic parameters  are indeterminate.   Right Ventricle: The right ventricular size is severely enlarged. Right  vetricular wall thickness was not well visualized. Right ventricular   systolic function is moderately reduced.   Left Atrium: Left atrial size was severely dilated.   Right Atrium: Right atrial size was severely dilated.   Pericardium: There is no evidence of pericardial effusion.   Mitral Valve: There is at least moderate prosthetic valve stenosis with  mean gradient 11mmHg at HR 95bpm. The mitral valve has been  repaired/replaced. Trivial mitral valve regurgitation. There is a 27 mm  Medtronic bioprosthetic valve present in the  mitral position. MV peak gradient, 16.8 mmHg. The mean mitral valve  gradient is 11.0 mmHg.   Tricuspid Valve: The tricuspid valve is grossly normal. Tricuspid valve  regurgitation is severe.   Aortic Valve: DI 0.51. The aortic valve has been repaired/replaced. Aortic  valve regurgitation is not visualized. Aortic valve mean gradient measures  7.0 mmHg. Aortic valve peak gradient measures 12.7 mmHg. There is a 23 mm  bioprosthetic valve present in   the aortic position. Echo findings are consistent with normal structure  and function of the aortic valve prosthesis.   Pulmonic Valve: The pulmonic valve was normal in structure. Pulmonic valve  regurgitation is trivial.   Aorta: The aortic root is normal in size and structure.   IAS/Shunts: The atrial septum is grossly normal.   Additional Comments: A device lead is visualized.    Patient Profile     71 y.o. male with a hx of CAD s/p 2V CABG in 2020, aortic stenosis s/p AVR in 2020, mitral stenosis s/p MVR in 2020, symptomatic bradycardia s/p pacemaker placement, atrial flutter, tobacco abuse, hyperlipidemia, prostate cancer and esophageal stricture  who was seen 05/23/2022 for the evaluation of DOE at the request of Dr. Trifan.   Assessment & Plan    Generalized Malaise CAD s/p 2v CABG -- presented with generalized malaise, no reports of chest pain. Story is somewhat vague. Mild dyspnea. hsTn 185>>178. Mostly complains of left leg pain and weakness which has limited  his activity. Had fluid drained off the left knee several weeks ago.  -- continue statin, metoprolol    Dyspnea Hx of severe AS and MS s/p AVR/MVR Moderate to Severe RV enlargement  -- bioprosthetic AVR and MVR with CABG in 2020 -- echo this admission shows normal aortic function and trivial MR -- worsening TR from moderate on prior echo 06/2019 to severe this admission -- RV now severely enlarged, with LV under filled, concern for PAH -- BNP 312, given IV lasix 40mg x1 yesterday, 1.5L UOP  -- planned for R/LHC today, INR 2.0 this morning. Planned for recheck around 10am  HLD -- LDL 37, HDL 25 -- continue statin   CHB -- s/p PPM -- follows with EP   Hx of atrial flutter -- remains in sinus rhythm, Vpaced on telemetry -- holding coumadin as above, IV heparin once INR < 2 -- discussed potentially transitioning to Eliquis at discharge which he is   agreeable to    Recent Weight loss -- unclear etiology for the past several months -- undergoing work up as an outpatient with GI and planned for colonoscopy    For questions or updates, please contact [facility] Please consult [email or web address] for contact info under        Signed, Lindsay Roberts, NP  05/25/2022, 9:25 AM    I have personally seen and examined this patient. I agree with the assessment and plan as outlined above. He is admitted with dyspnea/failure to thrive/weight loss. BNP was elevated. Echo with preserved LV systolic function, moderate mitral stenosis, normally functioning AVR, enlarged RV with RV pressure overload, severe TR. The LV is under filled.  He is feeling well today. Denies chest pain or dyspnea.  Plans for right and left heart cath today with coronary/bypass angiography. (Left radial arterial access) He is NPO INR 2.0 this am. Repeat pending.  If INR is below 1.8 on repeat, can proceed with cardiac cath today.  Further planning today following his cardiac cath.   Candace Begue, MD,  FACC 05/25/2022 10:31 AM    

## 2022-05-25 NOTE — Interval H&P Note (Signed)
History and Physical Interval Note:  05/25/2022 2:22 PM  [name]  has presented today for surgery, with the diagnosis of chest pain.  The various methods of treatment have been discussed with the patient and family. After consideration of risks, benefits and other options for treatment, the patient has consented to  Procedure(s): RIGHT/LEFT HEART CATH AND CORONARY/GRAFT ANGIOGRAPHY (N/A) as a surgical intervention.  The patient's history has been reviewed, patient examined, no change in status, stable for surgery.  I have reviewed the patient's chart and labs.  Questions were answered to the patient's satisfaction.    Cath Lab Visit (complete for each Cath Lab visit)  Clinical Evaluation Leading to the Procedure:   ACS: Yes.    Non-ACS:    Anginal Classification: CCS II  Anti-ischemic medical therapy: Minimal Therapy (1 class of medications)  Non-Invasive Test Results: No non-invasive testing performed  Prior CABG: Previous CABG        Verne Carrow

## 2022-05-25 NOTE — Progress Notes (Addendum)
Rounding Note    Patient Name: [name] Date of Encounter: 05/25/2022  [facility] Cardiologist: Verne Carrow, MD   Subjective   Feeling well, no chest pain or dyspnea this morning.   Inpatient Medications    Scheduled Meds:  atorvastatin  20 mg Oral Daily   metoprolol tartrate  25 mg Oral BID   sodium chloride flush  3 mL Intravenous Q12H   sodium chloride flush  3 mL Intravenous Q12H   Continuous Infusions:  sodium chloride     sodium chloride 10 mL/hr at 05/25/22 0537   PRN Meds: sodium chloride, acetaminophen **OR** acetaminophen, polyethylene glycol, sodium chloride flush   Vital Signs    Vitals:   05/25/22 0324 05/25/22 0543 05/25/22 0736 05/25/22 0853  BP: 118/75  112/80   Pulse: 100  98   Resp: Temp: 98.7 F (37.1 C) 99 F (37.2 C) 98.7 F (37.1 C)   TempSrc: Oral Oral Oral   SpO2: 94%  97% 95%  Weight: 96.9 kg 96.6 kg    Height:        Intake/Output Summary (Last 24 hours) at 05/25/2022 0925 Last data filed at 05/25/2022 0327 Gross per 24 hour  Intake 200 ml  Output 1540 ml  Net -1340 ml      05/25/2022    5:43 AM 05/25/2022    3:24 AM 05/24/2022    3:53 AM  Last 3 Weights  Weight (lbs) 212 lb 14.4 oz 213 lb 9.6 oz 218 lb 3.2 oz  Weight (kg) 96.571 kg 96.888 kg 98.975 kg      Telemetry    Sinus Rhythm, intermittent AV/V pacing - Personally Reviewed  ECG    No new tracing  Physical Exam   GEN: No acute distress.   Neck: No JVD Cardiac: RRR, + systolic murmur, no rubs, or gallops.  Respiratory: Clear to auscultation bilaterally. GI: Soft, nontender, non-distended  MS: No edema; No deformity. Neuro:  Nonfocal  Psych: Normal affect   Labs    High Sensitivity Troponin:   Recent Labs  Lab 05/23/22 1126 05/23/22 1351 05/24/22 0826 05/24/22 1016  TROPONINIHS 185* 178* 88* 72*     Chemistry Recent Labs  Lab 05/23/22 1830 05/24/22 0742 05/24/22 0826 05/25/22 0219  NA  --  130* 131* 132*   K  --  3.4* 3.4* 4.5  CL  --  102 101 103  CO2  --  21* 20* 20*  GLUCOSE  --  166* 156* 104*  BUN  --  CREATININE  --  0.95 0.92 0.92  CALCIUM  --  8.0* 8.0* 8.4*  MG 1.9  --  1.9 2.6*  PROT  --  6.0* 6.0* 6.8  ALBUMIN  --  2.5* 2.3* 2.6*  AST  --  ALT  --  ALKPHOS  --  84 76 93  BILITOT  --  1.6* 1.3* 1.6*  GFRNONAA  --  >60 >60 >60  ANIONGAP  --  Lipids  Recent Labs  Lab 05/25/22 0219  CHOL 77  TRIG 73  HDL 25*  LDLCALC 37  CHOLHDL 3.1    Hematology Recent Labs  Lab 05/24/22 0742 05/24/22 0826 05/25/22 0219  WBC 8.7 8.0 8.8  RBC 4.03* 3.83*  3.78* 4.38  HGB 10.5* 9.7* 11.3*  HCT 32.4* 31.2* 35.7*  MCV 80.4 81.5 81.5  MCH 26.1 25.3*  25.8*  MCHC 32.4 31.1 31.7  RDW 16.4* 16.4* 16.3*  PLT 139* 134* 145*   Thyroid No results for input(s): "TSH", "FREET4" in the last 168 hours.  BNP Recent Labs  Lab 05/23/22 1126  BNP 312.7*    DDimer No results for input(s): "DDIMER" in the last 168 hours.   Radiology    CT KNEE LEFT W CONTRAST  Result Date: 05/24/2022 CLINICAL DATA:  Left leg pain and weakness. Had a knee aspiration several weeks ago. Possible pseudoaneurysm seen in the popliteal fossa on DVT study. EXAM: CT OF THE LEFT KNEE WITH CONTRAST TECHNIQUE: Multidetector CT imaging was performed following the standard protocol during bolus administration of intravenous contrast. RADIATION DOSE REDUCTION: This exam was performed according to the departmental dose-optimization program which includes automated exposure control, adjustment of the mA and/or kV according to patient size and/or use of iterative reconstruction technique. CONTRAST:  OMNIPAQUE IOHEXOL 350 MG/ML SOLN COMPARISON:  None Available. FINDINGS: Bones/Joint/Cartilage No fracture or dislocation. Moderate to severe medial compartment joint space narrowing with near bone-on-bone apposition medially. The patellofemoral and lateral compartment joint spaces are  relatively preserved. Small to moderate joint effusion. Fluid extends into the popliteus tendon sheath. No Baker cyst. Ligaments Ligaments are suboptimally evaluated by CT. Muscles and Tendons Grossly intact.  No significant muscle atrophy. Soft tissue 2.3 x 1.9 x 2.1 cm cystic lesion with rim enhancement posterior to the lateral femoral condyle and lateral to the popliteal vessels, likely a ganglion cyst. No pseudoaneurysm. No worrisome soft tissue mass. IMPRESSION: 1. Moderate to severe medial compartment osteoarthritis with small to moderate joint effusion. 2. 2.3 cm ganglion cyst posterior to the lateral femoral condyle and lateral to the popliteal vessels. No pseudoaneurysm or worrisome soft tissue mass. Electronically Signed   By: Obie Dredge M.D.   On: 05/24/2022 09:18   VAS [place] LOWER EXTREMITY VENOUS (DVT) (7a-7p)  Result Date: 05/24/2022  Lower Venous DVT Study Patient Name:  [name]  Date of Exam:   05/23/2022 Medical Rec #: [id number]       Accession #:    [id number] Date of Birth: 1951/01/08       Patient Gender: M Patient Age:   72 years Exam Location:  [facility] Procedure:      VAS [place] LOWER EXTREMITY VENOUS (DVT) Referring Phys: Harlene Salts --------------------------------------------------------------------------------  Indications: Left leg pain with walking. Other Indications: Patient had fluid drained off knee 05/05/22. Comparison Study: No prior study on file Performing Technologist: Sherren Kerns RVS  Examination Guidelines: A complete evaluation includes B-mode imaging, spectral Doppler, color Doppler, and power Doppler as needed of all accessible portions of each vessel. Bilateral testing is considered an integral part of a complete examination. Limited examinations for reoccurring indications may be performed as noted. The reflux portion of the exam is performed with the patient in reverse Trendelenburg.   +---------+---------------+---------+-----------+----------+--------------+ RIGHT    CompressibilityPhasicitySpontaneityPropertiesThrombus Aging +---------+---------------+---------+-----------+----------+--------------+ CFV      Full           Yes      Yes                                 +---------+---------------+---------+-----------+----------+--------------+ SFJ      Full                                                        +---------+---------------+---------+-----------+----------+--------------+  FV Prox  Full                                                        +---------+---------------+---------+-----------+----------+--------------+ FV Mid   Full           Yes      Yes                                 +---------+---------------+---------+-----------+----------+--------------+ FV DistalFull                                                        +---------+---------------+---------+-----------+----------+--------------+ PFV      Full                                                        +---------+---------------+---------+-----------+----------+--------------+ POP      Full           Yes      Yes                                 +---------+---------------+---------+-----------+----------+--------------+ PTV      Full                                                        +---------+---------------+---------+-----------+----------+--------------+ PERO     Full                                                        +---------+---------------+---------+-----------+----------+--------------+   +---------+---------------+---------+-----------+----------+--------------+ LEFT     CompressibilityPhasicitySpontaneityPropertiesThrombus Aging +---------+---------------+---------+-----------+----------+--------------+ CFV      Full           Yes      Yes                                  +---------+---------------+---------+-----------+----------+--------------+ SFJ      Full                                                        +---------+---------------+---------+-----------+----------+--------------+ FV Prox  Full                                                        +---------+---------------+---------+-----------+----------+--------------+  FV Mid   Full           Yes      Yes                                 +---------+---------------+---------+-----------+----------+--------------+ FV DistalFull                                                        +---------+---------------+---------+-----------+----------+--------------+ PFV      Full                                                        +---------+---------------+---------+-----------+----------+--------------+ POP      Full           Yes      Yes                                 +---------+---------------+---------+-----------+----------+--------------+ PTV      Full                                                        +---------+---------------+---------+-----------+----------+--------------+ PERO     Full                                                        +---------+---------------+---------+-----------+----------+--------------+     Summary: RIGHT: - There is no evidence of deep vein thrombosis in the lower extremity.  - No cystic structure found in the popliteal fossa.  LEFT: - There is no evidence of deep vein thrombosis in the lower extremity.  - There appears to be, what may be consistent with, a partially thrombosed pseudoaneurysm in the popliteal fossa/proximal calf area adjacent to an effusion filled with mixed echoes. Further imaging should be considered to differentiate/classify findings.  *See table(s) above for measurements and observations. Electronically signed by Coral Else MD on 05/24/2022 at 8:05:33 AM.    Final    ECHOCARDIOGRAM COMPLETE  Result Date:  05/23/2022    ECHOCARDIOGRAM REPORT   Patient Name:   [name] Date of Exam: 05/23/2022 Medical Rec #:  [id number]      Height:       67.0 in Accession #:    [id number]     Weight:       223.0 lb Date of Birth:  Sep 27, 1950      BSA:          2.118 m Patient Age:    71 years       BP:           106/76 mmHg Patient Gender: M              HR:  96 bpm. Exam Location:  Inpatient Procedure: 2D Echo, Cardiac Doppler, Color Doppler and Intracardiac            Opacification Agent Indications:    Dyspnea  History:        Patient has prior history of Echocardiogram examinations, most                 recent 06/13/2019. Aortic Valve Disease and Mitral Valve Disease.                 50mm Edwards Inspiris Resilia AVR, 28mm Bovine AVR.  Sonographer:    Milbert Coulter Referring Phys: [id number] Cecille Po MELVIN  Sonographer Comments: No subcostal window. Image acquisition challenging due to patient body habitus. IMPRESSIONS  1. Right ventricular systolic function is moderately reduced. The right ventricular size is severely enlarged.  2. Left ventricular ejection fraction, by estimation, is 55 to 60%. The left ventricle has normal function. The left ventricle has no regional wall motion abnormalities. Left ventricular diastolic parameters are indeterminate. There is the interventricular septum is flattened in systole, consistent with right ventricular pressure overload and the interventricular septum is flattened in diastole ('D' shaped left ventricle), consistent with right ventricular volume overload.  3. The mitral valve has been repaired/replaced. Trivial mitral valve regurgitation. There is at least moderate mitral valve prosthetic stenosis with a gradient is 11.0 mmHg at HR 95bpm.  4. Tricuspid valve regurgitation is severe.  5. The aortic valve has been repaired/replaced. Aortic valve regurgitation is not visualized. Echo findings are consistent with normal structure and function of the aortic valve prosthesis. Aortic  valve mean gradient measures 7.0 mmHg. Aortic valve Vmax  measures 1.78 m/s. DI 0.5.  6. Left atrial size was severely dilated.  7. Right atrial size was severely dilated. Comparison(s): Compared to prior TTE in 06/2019, the RV is now severely dilated and moderately hypokinetic with severe TR. FINDINGS  Left Ventricle: Left ventricular ejection fraction, by estimation, is 55 to 60%. The left ventricle has normal function. The left ventricle has no regional wall motion abnormalities. Definity contrast agent was given IV to delineate the left ventricular  endocardial borders. The left ventricular internal cavity size was small. There is no left ventricular hypertrophy. The interventricular septum is flattened in systole, consistent with right ventricular pressure overload and the interventricular septum is flattened in diastole ('D' shaped left ventricle), consistent with right ventricular volume overload. Left ventricular diastolic parameters are indeterminate. Right Ventricle: The right ventricular size is severely enlarged. Right vetricular wall thickness was not well visualized. Right ventricular systolic function is moderately reduced. Left Atrium: Left atrial size was severely dilated. Right Atrium: Right atrial size was severely dilated. Pericardium: There is no evidence of pericardial effusion. Mitral Valve: There is at least moderate prosthetic valve stenosis with mean gradient at HR 95bpm. The mitral valve has been repaired/replaced. Trivial mitral valve regurgitation. There is a 27 mm Medtronic bioprosthetic valve present in the mitral position. MV peak gradient, 16.8 mmHg. The mean mitral valve gradient is 11.0 mmHg. Tricuspid Valve: The tricuspid valve is grossly normal. Tricuspid valve regurgitation is severe. Aortic Valve: DI 0.51. The aortic valve has been repaired/replaced. Aortic valve regurgitation is not visualized. Aortic valve mean gradient measures 7.0 mmHg. Aortic valve peak gradient  measures 12.7 mmHg. There is a 23 mm bioprosthetic valve present in  the aortic position. Echo findings are consistent with normal structure and function of the aortic valve prosthesis. Pulmonic Valve: The pulmonic valve was normal in  structure. Pulmonic valve regurgitation is trivial. Aorta: The aortic root is normal in size and structure. IAS/Shunts: The atrial septum is grossly normal. Additional Comments: A device lead is visualized.  LEFT VENTRICLE PLAX 2D LVIDd:         3.90 cm LVIDs:         3.20 cm LV PW:         1.10 cm LV IVS:        1.00 cm  RIGHT VENTRICLE RV S prime:     7.07 cm/s TAPSE (M-mode): 1.1 cm LEFT ATRIUM         Index LA diam:    5.70 cm 2.69 cm/m  AORTIC VALVE AV Vmax:           178.00 cm/s AV Vmean:          121.500 cm/s AV VTI:            0.292 m AV Peak Grad:      12.7 mmHg AV Mean Grad:      7.0 mmHg LVOT Vmax:         98.00 cm/s LVOT Vmean:        66.600 cm/s LVOT VTI:          0.158 m LVOT/AV VTI ratio: 0.54 MITRAL VALVE MV Peak grad: 16.8 mmHg  SHUNTS MV Mean grad: 11.0 mmHg  Systemic VTI: 0.16 m MV Vmax:      2.05 m/s MV Vmean:     161.0 cm/s Laurance Flatten MD Electronically signed by Laurance Flatten MD Signature Date/Time: 05/23/2022/5:39:50 PM    Final    DG Chest Port 1 View  Result Date: 05/23/2022 CLINICAL DATA:  Dyspnea on exertion. EXAM: PORTABLE CHEST 1 VIEW COMPARISON:  May 29, 2019. FINDINGS: Stable cardiomediastinal silhouette. Lungs are clear. Left-sided pacemaker is unchanged. Sternotomy wires are noted. Bony thorax is unremarkable. IMPRESSION: No active disease. Electronically Signed   By: Lupita Raider M.D.   On: 05/23/2022 11:52    Cardiac Studies   Echo: 05/23/2022  IMPRESSIONS     1. Right ventricular systolic function is moderately reduced. The right  ventricular size is severely enlarged.   2. Left ventricular ejection fraction, by estimation, is 55 to 60%. The  left ventricle has normal function. The left ventricle has no regional  wall  motion abnormalities. Left ventricular diastolic parameters are  indeterminate. There is the  interventricular septum is flattened in systole, consistent with right  ventricular pressure overload and the interventricular septum is flattened  in diastole ('D' shaped left ventricle), consistent with right ventricular  volume overload.   3. The mitral valve has been repaired/replaced. Trivial mitral valve  regurgitation. 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   4. Tricuspid valve regurgitation is severe.   5. The aortic valve has been repaired/replaced. Aortic valve  regurgitation is not visualized. Echo findings are consistent with normal  structure and function of the aortic valve prosthesis. Aortic valve mean  gradient measures 7.0 mmHg. Aortic valve Vmax   measures 1.78 m/s. DI 0.5.   6. Left atrial size was severely dilated.   7. Right atrial size was severely dilated.   Comparison(s): Compared to prior TTE in 06/2019, the RV is now severely  dilated and moderately hypokinetic with severe TR.   FINDINGS   Left Ventricle: Left ventricular ejection fraction, by estimation, is 55  to 60%. The left ventricle has normal function.  The left ventricle has no  regional wall motion abnormalities. Definity contrast agent was given IV  to delineate the left ventricular   endocardial borders. The left ventricular internal cavity size was small.  There is no left ventricular hypertrophy. The interventricular septum is  flattened in systole, consistent with right ventricular pressure overload  and the interventricular septum  is flattened in diastole ('D' shaped left ventricle), consistent with  right ventricular volume overload. Left ventricular diastolic parameters  are indeterminate.   Right Ventricle: The right ventricular size is severely enlarged. Right  vetricular wall thickness was not well visualized. Right ventricular   systolic function is moderately reduced.   Left Atrium: Left atrial size was severely dilated.   Right Atrium: Right atrial size was severely dilated.   Pericardium: There is no evidence of pericardial effusion.   Mitral Valve: There is at least moderate prosthetic valve stenosis with  mean gradient at HR 95bpm. The mitral valve has been  repaired/replaced. Trivial mitral valve regurgitation. There is a 27 mm  Medtronic bioprosthetic valve present in the  mitral position. MV peak gradient, 16.8 mmHg. The mean mitral valve  gradient is 11.0 mmHg.   Tricuspid Valve: The tricuspid valve is grossly normal. Tricuspid valve  regurgitation is severe.   Aortic Valve: DI 0.51. The aortic valve has been repaired/replaced. Aortic  valve regurgitation is not visualized. Aortic valve mean gradient measures  7.0 mmHg. Aortic valve peak gradient measures 12.7 mmHg. There is a 23 mm  bioprosthetic valve present in   the aortic position. Echo findings are consistent with normal structure  and function of the aortic valve prosthesis.   Pulmonic Valve: The pulmonic valve was normal in structure. Pulmonic valve  regurgitation is trivial.   Aorta: The aortic root is normal in size and structure.   IAS/Shunts: The atrial septum is grossly normal.   Additional Comments: A device lead is visualized.    Patient Profile     72 y.o. male with a hx of CAD s/p 2V CABG in 2020, aortic stenosis s/p AVR in 2020, mitral stenosis s/p MVR in 2020, symptomatic bradycardia s/p pacemaker placement, atrial flutter, tobacco abuse, hyperlipidemia, prostate cancer and esophageal stricture  who was seen 05/23/2022 for the evaluation of DOE at the request of Dr. Renaye Rakers.   Assessment & Plan    Generalized Malaise CAD s/p 2v CABG -- presented with generalized malaise, no reports of chest pain. Story is somewhat vague. Mild dyspnea. hsTn 185>>178. Mostly complains of left leg pain and weakness which has limited  his activity. Had fluid drained off the left knee several weeks ago.  -- continue statin, metoprolol    Dyspnea Hx of severe AS and MS s/p AVR/MVR Moderate to Severe RV enlargement  -- bioprosthetic AVR and MVR with CABG in 2020 -- echo this admission shows normal aortic function and trivial MR -- worsening TR from moderate on prior echo 06/2019 to severe this admission -- RV now severely enlarged, with LV under filled, concern for PAH -- BNP 312, given IV lasix 40mg  x1 yesterday, 1.5L UOP  -- planned for [facility] today, INR 2.0 this morning. Planned for recheck around 10am  HLD -- LDL 37, HDL 25 -- continue statin   CHB -- s/p PPM -- follows with EP   Hx of atrial flutter -- remains in sinus rhythm, Vpaced on telemetry -- holding coumadin as above, IV heparin once INR < 2 -- discussed potentially transitioning to Eliquis at discharge which he is  agreeable to    Recent Weight loss -- unclear etiology for the past several months -- undergoing work up as an outpatient with GI and planned for colonoscopy    For questions or updates, please contact [facility] Please consult [email or web address] for contact info under        Signed, Laverda Page, NP  05/25/2022, 9:25 AM    I have personally seen and examined this patient. I agree with the assessment and plan as outlined above. He is admitted with dyspnea/failure to thrive/weight loss. BNP was elevated. Echo with preserved LV systolic function, moderate mitral stenosis, normally functioning AVR, enlarged RV with RV pressure overload, severe TR. The LV is under filled.  He is feeling well today. Denies chest pain or dyspnea.  Plans for right and left heart cath today with coronary/bypass angiography. (Left radial arterial access) He is NPO INR 2.0 this am. Repeat pending.  If INR is below 1.8 on repeat, can proceed with cardiac cath today.  Further planning today following his cardiac cath.   Verne Carrow, MD,  [facility] 05/25/2022 10:31 AM

## 2022-05-25 NOTE — Progress Notes (Addendum)
ANTICOAGULATION CONSULT NOTE  Pharmacy Consult for heparin Indication: atrial fibrillation  No Known Allergies  Patient Measurements: Height: 5\' 7"  (170.2 cm) Weight: 96.6 kg (212 lb 14.4 oz) IBW/kg (Calculated) : 66.1 Heparin Dosing Weight: 99kg  Vital Signs: Temp: 98.7 F (37.1 C) (04/16 0736) Temp Source: Oral (04/16 0736) BP: 112/80 (04/16 0736) Pulse Rate: 98 (04/16 0736)  Labs: Recent Labs    05/23/22 1126 05/23/22 1351 05/24/22 0742 05/24/22 0826 05/24/22 1016 05/24/22 1333 05/25/22 0219  HGB 10.0*  --  10.5* 9.7*  --   --  11.3*  HCT 31.6*  --  32.4* 31.2*  --   --  35.7*  PLT 156  --  139* 134*  --   --  145*  APTT 53*  --   --   --   --   --   --   LABPROT 32.7*  --   --  30.7*  --  30.5* 22.1*  INR 3.2*  --   --  3.0*  --  2.9* 2.0*  CREATININE 1.22  --  0.95 0.92  --   --  0.92  TROPONINIHS 185* 178*  --  88* 72*  --   --      Estimated Creatinine Clearance: 81.6 mL/min (by C-G formula based on SCr of 0.92 mg/dL).   Medical History: Past Medical History:  Diagnosis Date   BCE (basal cell epithelioma), arm    RIGHT SHOULDER   Bradycardia    Bumps on skin    on left side of nose for last 3 weeks   CAD (coronary artery disease)    a. s/p CABGx2 10/11/18 LIMA to LAD, SVG to RCA, EVH via right thigh.   Dyslipidemia    Ectatic thoracic aorta    Esophageal stricture    GERD (gastroesophageal reflux disease)    Heart murmur    sees dr Rose Fillers   Hyperlipidemia    Incidental pulmonary nodule, > 101mm and < 57mm 10/04/2018   Noted on CTA   Inguinal hernia    bilateral   Obesity    Presence of permanent cardiac pacemaker    Prostate cancer    S/P aortic valve replacement with bioprosthetic valve 10/11/2018   23 mm Edwards Inspiris Resilia stented bovine pericardial tissue valve, miltral valve done also   S/P CABG x 2 10/11/2018   LIMA to LAD, SVG to RCA, EVH via right thigh   S/P mitral valve replacement with bioprosthetic valve 10/11/2018   27 mm  Medtronic Mosaic stented porcine bioprosthetic tissue valve   S/P patent foramen ovale closure 10/11/2018   S/P placement of cardiac pacemaker    Smoker       Assessment: 68 yoM admitted with DOE. Pt with hx bMVR/AVR and AF on warfarin PTA. Warfarin held on admit, planning for heparin once INR <2.  INR down to 2.0 today s/p PO vitamin K 5mg  on 4/15.   Goal of Therapy:  Heparin level 0.3-0.7 units/ml Monitor platelets by anticoagulation protocol: Yes   Plan:  Heparin once INR <2 Repeat INR this morning  ADDENDUM 1207: repeat INR down to 1.7, cath planned this afternoon so will defer starting heparin drip and F/U [facility] plans post/cath.  Fredonia Highland, PharmD, BCPS, [facility] Clinical Pharmacist [phone] Please check AMION for all [facility] Pharmacy numbers 05/25/2022

## 2022-05-25 NOTE — Progress Notes (Signed)
PROGRESS NOTE    [name]  WGN:[id number] DOB: December 07, 1950 DOA: 05/23/2022 PCP: [name], MD     Brief Narrative:   72 y.o. WM PMHx CAD status post CABG x 2, S/P  left atrial appendage clipping, S/P  foramen ovale closure, S/P aortic and mitral valve replacement with bioprosthetic, HLD, complete heart block S/P pacemaker, atrial flutter, obesity, prostate cancer, tobacco abuse  Presenting with dyspnea on exertion.   Patient reports about 3 days of worsening dyspnea on exertion.  Reports associated nausea and fatigue as well.  He also has had about a month of generalized weakness and decreased p.o. intake with weight loss.  Has an outpatient workup planned for this with GI to do a colonoscopy.   Reports some darker brown stools at times but denies any black stools no bloody stools and his most recent stools have been normal.  Denies fevers, chills, chest pain, abdominal pain, constipation, diarrhea.     ED Course: Vital signs in the ED notable for heart rate in the 90s to 100s, respiratory rate in the teens to 20s.  Lab workup included BMP with sodium 133, potassium 3.4, bicarb 21, glucose 120, calcium 8.0.  CBC with borderline leukocytosis at 10.5 and hemoglobin of 10 near baseline of 11-12 in the last couple weeks.  PT and INR elevated at 32.7-3.2 respectively on warfarin.  PTT 53.  Troponin elevated to 185 with repeat pending.  BNP borderline at 312.  Magnesium pending.  Chest x-ray showed no acute Gershon Mussel.  Patient received 40 mill equivalents p.o. potassium in the ED.  Cardiology consulted and recommended echocardiogram, troponin, holding warfarin in favor of heparin when INR less than 2 if procedure is needed.  They will consult.   Subjective: 4/16 A/O x 4, resting comfortably in bed, negative CP, negative SOB.   Assessment & Plan: Covid vaccination;   Principal Problem:   NSTEMI (non-ST elevated myocardial infarction) Active Problems:   S/P patent foramen ovale  closure   Obesity (BMI 30-39.9)   Mixed hyperlipidemia   Coronary artery disease involving native coronary artery of native heart without angina pectoris   S/P aortic + mitral valve replacement with bioprosthetic valves   S/P CABG x 2   Complete heart block   Pacemaker   Malignant neoplasm of prostate   Atrial flutter   Demand ischemia  Dyspnea on exertion/NSTEMI/?Rule out CHF, ACS -On admission slightly elevated troponin 185 with repeat pending.  BNP borderline at 312. -Known history of CAD status post CABG x 2 - Trend troponin - Echocardiogram pending -Patient being transitioned to heparin in preparation for cardiac catheterization.  Awaiting INR,<2 Lab Results  Component Value Date   INR 1.7 (H) 05/25/2022   INR 2.0 (H) 05/25/2022   INR 2.9 (H) 05/24/2022   INR 3.0 (H) 05/24/2022   INR 3.2 (H) 05/23/2022  -Strict ins and out -1.3 L - Daily weight Filed Weights   05/24/22 0353 05/25/22 0324 05/25/22 0543  Weight: 99 kg 96.9 kg 96.6 kg  -4/16 cardiology believes symptoms are likely related to his severe TR -4/16 cardiology recommendations post RIGHT/LEFT catheterization  Negative further diuresis.   Medical management of CAD Consult advanced CHF team    Cardiomyopathy -S/p foramen ovale closure - S/p LAA clipping - S/p bioprosthetic replacement of aortic and mitral valve - See ACS   Complete heart block/Atrial flutter -Status post pacemaker.  Paced rhythm in the ED. - Continue home metoprolol - Transitioning warfarin to heparin as above -4/16  continued paced rhythm   Hypokalemia -Potassium goal> 4 -K-Dur 40 mEq x 3 doses  Hypomagnesmia - Magnesium goal> 2 - Magnesium IV 2 g   Anemia unspecified (baseline 11-12)  - Anemia panel pending Lab Results  Component Value Date   HGB 10.9 (L) 05/25/2022   HGB 11.3 (L) 05/25/2022   HGB 9.7 (L) 05/24/2022   HGB 10.5 (L) 05/24/2022   HGB 10.0 (L) 05/23/2022   Fatigue/Weight loss - GI workup pending outpatient  with colonoscopy. -Reports some darker brown stools at times but denies any black stools no bloody stools and his most recent stools have been normal.   Left knee effusion -LEFT appears to be, what may be consistent with, a partially thrombosed  pseudoaneurysm in the popliteal fossa/proximal calf area adjacent to an  effusion filled with mixed echoes. Further imaging should be considered to  differentiate/classify findings.  -CT LEFT knee pending to further delineate   Hyperlipidemia -Lipitor 20 mg daily - Lipid panel pending   OBESITY(BMI 34.17 kg/m.)   Goals of care - 4/16 wife extremely anxious that patient has a paced rhythm.  States last year pacer only turned on~6 times.  May ease her anxiety if you could have your partner Dr. Graciela Husbands or Camnitz speak with her.  Tried to explain to her it was a good thing that he had a paced sinus rhythm.    Mobility Assessment (last 72 hours)     Mobility Assessment     Row Name 05/25/22 [phone] 05/25/22 0907 05/25/22 0857 05/24/22 2125 05/24/22 2124   Does patient have an order for bedrest or is patient medically unstable No - Continue assessment No - Continue assessment No - Continue assessment No - Continue assessment No - Continue assessment   What is the highest level of mobility based on the progressive mobility assessment? Level 5 (Walks with assist in room/hall) - Balance while stepping forward/back and can walk in room with assist - Complete Level 4 (Walks with assist in room) - Balance while marching in place and cannot step forward and back - Complete Level 4 (Walks with assist in room) - Balance while marching in place and cannot step forward and back - Complete -- --   Is the above level different from baseline mobility prior to current illness? Yes - Recommend PT order Yes - Recommend PT order Yes - Recommend PT order -- --    Row Name 05/24/22 1200 05/24/22 0927 05/23/22 1728       Does patient have an order for bedrest or is  patient medically unstable No - Continue assessment No - Continue assessment No - Continue assessment     What is the highest level of mobility based on the progressive mobility assessment? Level 4 (Walks with assist in room) - Balance while marching in place and cannot step forward and back - Complete Level 4 (Walks with assist in room) - Balance while marching in place and cannot step forward and back - Complete Level 3 (Stands with assist) - Balance while standing  and cannot march in place     Is the above level different from baseline mobility prior to current illness? Yes - Recommend PT order Yes - Recommend PT order Yes - Recommend PT order                    DVT prophylaxis: Warfarin, transitioning to heparin when INR less than 2  Code Status: Full  Family Communication: 4/16 wife at bedside for  discussion of plan of care all questions answered Status is: Inpatient    Dispo: The patient is from: Home              Anticipated d/c is to: Home              Anticipated d/c date is: 2 days              Patient currently is not medically stable to d/c.      Consultants:  Cardiology  Procedures/Significant Events:  4/14 echocardiogram  Left Ventricle: LVEF= 55 to 60%.  -The interventricular septum is flattened in systole, consistent with right ventricular pressure overload and the interventricular septum is flattened in diastole ('D' shaped left ventricle), consistent with right ventricular volume overload.  -Left ventricular diastolic parameters are indeterminate.  -Right Ventricle: The right ventricular size is severely enlarged.Right ventricular  systolic function is moderately reduced.  -LEFT and RIGHT Atrium: severely dilated.  -Mitral Valve: There is at least moderate prosthetic valve stenosis  The mitral valve has been  repaired/replaced. There is a 27 mm Medtronic bioprosthetic valve present in the  mitral position.  -Tricuspid Valve: regurgitation is severe.   -Aortic Valve: aortic valve has been repaired/replaced. There is a 23 mm bioprosthetic valve present  4/14 Doppler bilateral lower extremity - Negative DVT - LEFT appears to be, what may be consistent with, a partially thrombosed  pseudoaneurysm in the popliteal fossa/proximal calf area adjacent to an  effusion filled with mixed echoes. Further imaging should be considered to  differentiate/classify findings. 4/16 RIGHT/LEFT catheterization    Prox LAD lesion is 40% stenosed.   Mid LAD lesion is 95% stenosed.   Prox Cx to Mid Cx lesion is 30% stenosed.   1st Mrg lesion is 30% stenosed.   3rd Mrg lesion is 30% stenosed.   Prox RCA lesion is 100% stenosed.   SVG graft was visualized by angiography and is normal in caliber.   LIMA graft was visualized by angiography and is normal in caliber.   Severe mid LAD stenosis. Competitive filling is seen distally from the patent LIMA graft.  Patent LIMA graft to the mid LAD.  Mild non-obstructive disease in the Circumflex artery.  Large dominant RCA with chronic proximal occlusion. The mid and distal RCA fills from the patent vein graft.  Near normal right and left heart pressures.  CO 6.92 L/min CI 3.33        I have personally reviewed and interpreted all radiology studies and my findings are as above.  VENTILATOR SETTINGS:    Cultures   Antimicrobials:    Devices    LINES / TUBES:      Continuous Infusions:  sodium chloride 50 mL/hr at 05/25/22 1620   sodium chloride     [START ON 05/26/2022] heparin       Objective: Vitals:   05/25/22 1538 05/25/22 1543 05/25/22 1548 05/25/22 1614  BP: 117/69 124/68 109/66 114/86  Pulse: 100 (!) 102 (!) 101 (!) 102  Resp: (!) 24 (!) 23 (!) 22 20  Temp:    98.6 F (37 C)  TempSrc:    Oral  SpO2: 92% 93% 96% 96%  Weight:      Height:        Intake/Output Summary (Last 24 hours) at 05/25/2022 1743 Last data filed at 05/25/2022 1000 Gross per 24 hour  Intake 73.66 ml   Output 740 ml  Net -666.34 ml    Filed Weights   05/24/22 0353 05/25/22  1610 05/25/22 0543  Weight: 99 kg 96.9 kg 96.6 kg   Physical Exam:  General: A/O x 4, No acute respiratory distress Eyes: negative scleral hemorrhage, negative anisocoria, negative icterus ENT: Negative Runny nose, negative gingival bleeding, Neck:  Negative scars, masses, torticollis, lymphadenopathy, JVD Lungs: Clear to auscultation bilaterally without wheezes or crackles Cardiovascular: Paced sinus rhythm without murmur gallop or rub normal S1 and S2 Abdomen: negative abdominal pain, nondistended, positive soft, bowel sounds, no rebound, no ascites, no appreciable mass Extremities: No significant cyanosis, clubbing, or edema bilateral lower extremities Skin: Negative rashes, lesions, ulcers Psychiatric:  Negative depression, negative anxiety, negative fatigue, negative mania  Central nervous system:  Cranial nerves II through XII intact, tongue/uvula midline, all extremities muscle strength 5/5, sensation intact throughout, negative dysarthria, negative expressive aphasia, negative receptive aphasia.   .     Data Reviewed: Care during the described time interval was provided by me .  I have reviewed this patient's available data, including medical history, events of note, physical examination, and all test results as part of my evaluation.  CBC: Recent Labs  Lab 05/23/22 1126 05/24/22 0742 05/24/22 0826 05/25/22 0219 05/25/22 1503  WBC 10.7* 8.7 8.0 8.8  --   NEUTROABS  --   --  6.8 7.3  --   HGB 10.0* 10.5* 9.7* 11.3* 10.9*  HCT 31.6* 32.4* 31.2* 35.7* 32.0*  MCV 81.7 80.4 81.5 81.5  --   PLT 156 139* 134* 145*  --     Basic Metabolic Panel: Recent Labs  Lab 05/23/22 1126 05/23/22 1830 05/24/22 0742 05/24/22 0826 05/25/22 0219 05/25/22 1503  NA 133*  --  130* 131* 132* 140  K 3.4*  --  3.4* 3.4* 4.5 3.7  CL 102  --  102 101 103  --   CO2 21*  --  21* 20* 20*  --   GLUCOSE 128*  --   166* 156* 104*  --   BUN 11  --  --   CREATININE 1.22  --  0.95 0.92 0.92  --   CALCIUM 8.0*  --  8.0* 8.0* 8.4*  --   MG  --  1.9  --  1.9 2.6*  --   PHOS  --   --   --  2.4* 3.3  --     GFR: Estimated Creatinine Clearance: 81.6 mL/min (by C-G formula based on SCr of 0.92 mg/dL). Liver Function Tests: Recent Labs  Lab 05/24/22 0742 05/24/22 0826 05/25/22 0219  AST ALT ALKPHOS 84 76 93  BILITOT 1.6* 1.3* 1.6*  PROT 6.0* 6.0* 6.8  ALBUMIN 2.5* 2.3* 2.6*   No results for input(s): "LIPASE", "AMYLASE" in the last 168 hours. No results for input(s): "AMMONIA" in the last 168 hours. Coagulation Profile: Recent Labs  Lab 05/23/22 1126 05/24/22 0826 05/24/22 1333 05/25/22 0219 05/25/22 0950  INR 3.2* 3.0* 2.9* 2.0* 1.7*    Cardiac Enzymes: No results for input(s): "CKTOTAL", "CKMB", "CKMBINDEX", "TROPONINI" in the last 168 hours. BNP (last 3 results) No results for input(s): "PROBNP" in the last 8760 hours. HbA1C: No results for input(s): "HGBA1C" in the last 72 hours. CBG: No results for input(s): "GLUCAP" in the last 168 hours. Lipid Profile: Recent Labs    05/25/22 0219  CHOL 77  HDL 25*  LDLCALC 37  TRIG 73  CHOLHDL 3.1   Thyroid Function Tests: No results for input(s): "TSH", "T4TOTAL", "FREET4", "T3FREE", "THYROIDAB" in the  last 72 hours. Anemia Panel: Recent Labs    05/24/22 0826  VITAMINB12 292  FOLATE 8.2  FERRITIN 272  TIBC 192*  IRON 11*  RETICCTPCT 1.4   Sepsis Labs: No results for input(s): "PROCALCITON", "LATICACIDVEN" in the last 168 hours.  No results found for this or any previous visit (from the past 240 hour(s)).       Radiology Studies: CARDIAC CATHETERIZATION  Result Date: 05/25/2022   Prox LAD lesion is 40% stenosed.   Mid LAD lesion is 95% stenosed.   Prox Cx to Mid Cx lesion is 30% stenosed.   1st Mrg lesion is 30% stenosed.   3rd Mrg lesion is 30% stenosed.   Prox RCA lesion is 100%  stenosed.   SVG graft was visualized by angiography and is normal in caliber.   LIMA graft was visualized by angiography and is normal in caliber. Severe mid LAD stenosis. Competitive filling is seen distally from the patent LIMA graft. Patent LIMA graft to the mid LAD. Mild non-obstructive disease in the Circumflex artery. Large dominant RCA with chronic proximal occlusion. The mid and distal RCA fills from the patent vein graft. Near normal right and left heart pressures. CO 6.92 L/min CI 3.33 Recommendations: His symptoms are likely related to his severe TR. He does not appear to need further diuresis. Medical management of CAD. Consider Advanced Heart Failure team consult.   CT KNEE LEFT W CONTRAST  Result Date: 05/24/2022 CLINICAL DATA:  Left leg pain and weakness. Had a knee aspiration several weeks ago. Possible pseudoaneurysm seen in the popliteal fossa on DVT study. EXAM: CT OF THE LEFT KNEE WITH CONTRAST TECHNIQUE: Multidetector CT imaging was performed following the standard protocol during bolus administration of intravenous contrast. RADIATION DOSE REDUCTION: This exam was performed according to the departmental dose-optimization program which includes automated exposure control, adjustment of the mA and/or kV according to patient size and/or use of iterative reconstruction technique. CONTRAST:  OMNIPAQUE IOHEXOL 350 MG/ML SOLN COMPARISON:  None Available. FINDINGS: Bones/Joint/Cartilage No fracture or dislocation. Moderate to severe medial compartment joint space narrowing with near bone-on-bone apposition medially. The patellofemoral and lateral compartment joint spaces are relatively preserved. Small to moderate joint effusion. Fluid extends into the popliteus tendon sheath. No Baker cyst. Ligaments Ligaments are suboptimally evaluated by CT. Muscles and Tendons Grossly intact.  No significant muscle atrophy. Soft tissue 2.3 x 1.9 x 2.1 cm cystic lesion with rim enhancement posterior to the  lateral femoral condyle and lateral to the popliteal vessels, likely a ganglion cyst. No pseudoaneurysm. No worrisome soft tissue mass. IMPRESSION: 1. Moderate to severe medial compartment osteoarthritis with small to moderate joint effusion. 2. 2.3 cm ganglion cyst posterior to the lateral femoral condyle and lateral to the popliteal vessels. No pseudoaneurysm or worrisome soft tissue mass. Electronically Signed   By: Obie Dredge M.D.   On: 05/24/2022 09:18        Scheduled Meds:  atorvastatin  20 mg Oral Daily   metoprolol tartrate  25 mg Oral BID   sodium chloride flush  3 mL Intravenous Q12H   Continuous Infusions:  sodium chloride 50 mL/hr at 05/25/22 1620   sodium chloride     [START ON 05/26/2022] heparin       LOS: 0 days    Time spent:40 min    Elcie Pelster, Roselind Messier, MD [facility]   If 7PM-7AM, please contact night-coverage 05/25/2022, 5:43 PM

## 2022-05-25 NOTE — Progress Notes (Addendum)
ANTICOAGULATION CONSULT NOTE  Pharmacy Consult for heparin Indication: atrial fibrillation  No Known Allergies  Patient Measurements: Height:  (170.2 cm) Weight: 96.6 kg (212 lb 14.4 oz) IBW/kg (Calculated) : 66.1 Heparin Dosing Weight: 88kg  Vital Signs: Temp: 98.8 F (37.1 C) (04/16 1131) Temp Source: Oral (04/16 1131) BP: 109/66 (04/16 1548) Pulse Rate: 101 (04/16 1548)  Labs: Recent Labs    05/23/22 1126 05/23/22 1351 05/24/22 0742 05/24/22 0826 05/24/22 1016 05/24/22 1333 05/25/22 0219 05/25/22 0950 05/25/22 1503  HGB 10.0*  --  10.5* 9.7*  --   --  11.3*  --  10.9*  HCT 31.6*  --  32.4* 31.2*  --   --  35.7*  --  32.0*  PLT 156  --  139* 134*  --   --  145*  --   --   APTT 53*  --   --   --   --   --   --   --   --   LABPROT 32.7*  --   --  30.7*  --  30.5* 22.1* 20.0*  --   INR 3.2*  --   --  3.0*  --  2.9* 2.0* 1.7*  --   CREATININE 1.22  --  0.95 0.92  --   --  0.92  --   --   TROPONINIHS 185* 178*  --  88* 72*  --   --   --   --      Estimated Creatinine Clearance: 81.6 mL/min (by C-G formula based on SCr of 0.92 mg/dL).   Medical History: Past Medical History:  Diagnosis Date   BCE (basal cell epithelioma), arm    RIGHT SHOULDER   Bradycardia    Bumps on skin    on left side of nose for last 3 weeks   CAD (coronary artery disease)    a. s/p CABGx2 10/11/18 LIMA to LAD, SVG to RCA, EVH via right thigh.   Dyslipidemia    Ectatic thoracic aorta    Esophageal stricture    GERD (gastroesophageal reflux disease)    Heart murmur    sees dr Rose Fillers   Hyperlipidemia    Incidental pulmonary nodule, > 3mm and < 8mm 10/04/2018   Noted on CTA   Inguinal hernia    bilateral   Obesity    Presence of permanent cardiac pacemaker    Prostate cancer    S/P aortic valve replacement with bioprosthetic valve 10/11/2018   23 mm Edwards Inspiris Resilia stented bovine pericardial tissue valve, miltral valve done also   S/P CABG x 2 10/11/2018   LIMA to  LAD, SVG to RCA, EVH via right thigh   S/P mitral valve replacement with bioprosthetic valve 10/11/2018   27 mm Medtronic Mosaic stented porcine bioprosthetic tissue valve   S/P patent foramen ovale closure 10/11/2018   S/P placement of cardiac pacemaker    Smoker     Assessment: 67 yoM admitted with DOE. Pt with hx bMVR/AVR and AF on warfarin PTA. Warfarin held on admit, planning for heparin once INR <2. Patient is s/p [facility] today since INR 1.7 - pharmacy consulted to resume IV heparin 8 hours post-sheath removal. Sheath removed ~1600.   Goal of Therapy:  Heparin level 0.3-0.7 units/ml Monitor platelets by anticoagulation protocol: Yes   Plan:  Begin heparin at 1100 units/hr at midnight, no bolus Heparin level 8 hours after drip initiation F/u ability to resume oral anticoagulation  Rexford Maus, PharmD, BCPS 05/25/2022 4:14  PM

## 2022-05-26 ENCOUNTER — Other Ambulatory Visit (HOSPITAL_COMMUNITY): Payer: Self-pay

## 2022-05-26 ENCOUNTER — Encounter (HOSPITAL_COMMUNITY): Payer: Self-pay | Admitting: Cardiovascular Disease

## 2022-05-26 DIAGNOSIS — E669 Obesity, unspecified: Secondary | ICD-10-CM | POA: Diagnosis not present

## 2022-05-26 DIAGNOSIS — I4892 Unspecified atrial flutter: Secondary | ICD-10-CM | POA: Diagnosis not present

## 2022-05-26 DIAGNOSIS — I214 Non-ST elevation (NSTEMI) myocardial infarction: Secondary | ICD-10-CM | POA: Diagnosis not present

## 2022-05-26 DIAGNOSIS — Z950 Presence of cardiac pacemaker: Secondary | ICD-10-CM | POA: Diagnosis not present

## 2022-05-26 LAB — COMPREHENSIVE METABOLIC PANEL
ALT: 20 U/L (ref 0–44)
AST: 33 U/L (ref 15–41)
Albumin: 2.5 g/dL — ABNORMAL LOW (ref 3.5–5.0)
Alkaline Phosphatase: 99 U/L (ref 38–126)
Anion gap: 10 (ref 5–15)
BUN: 13 mg/dL (ref 8–23)
CO2: 19 mmol/L — ABNORMAL LOW (ref 22–32)
Calcium: 8.1 mg/dL — ABNORMAL LOW (ref 8.9–10.3)
Chloride: 102 mmol/L (ref 98–111)
Creatinine, Ser: 1.01 mg/dL (ref 0.61–1.24)
GFR, Estimated: >60 mL/min (ref >60)
Glucose, Bld: 117 mg/dL — ABNORMAL HIGH (ref 70–99)
Potassium: 4 mmol/L (ref 3.5–5.1)
Sodium: 131 mmol/L — ABNORMAL LOW (ref 135–145)
Total Bilirubin: 1.4 mg/dL — ABNORMAL HIGH (ref 0.3–1.2)
Total Protein: 6.4 g/dL — ABNORMAL LOW (ref 6.5–8.1)

## 2022-05-26 LAB — CBC WITH DIFFERENTIAL/PLATELET
Abs Immature Granulocytes: 0.09 10*3/uL — ABNORMAL HIGH (ref 0.00–0.07)
Basophils Absolute: 0 10*3/uL (ref 0.0–0.1)
Basophils Relative: 0 %
Eosinophils Absolute: 0 10*3/uL (ref 0.0–0.5)
Eosinophils Relative: 0 %
HCT: 33.8 % — ABNORMAL LOW (ref 39.0–52.0)
Hemoglobin: 10.8 g/dL — ABNORMAL LOW (ref 13.0–17.0)
Immature Granulocytes: 1 %
Lymphocytes Relative: 9 %
Lymphs Abs: 0.9 10*3/uL (ref 0.7–4.0)
MCH: 25.8 pg — ABNORMAL LOW (ref 26.0–34.0)
MCHC: 32 g/dL (ref 30.0–36.0)
MCV: 80.7 fL (ref 80.0–100.0)
Monocytes Absolute: 0.6 10*3/uL (ref 0.1–1.0)
Monocytes Relative: 7 %
Neutro Abs: 7.6 10*3/uL (ref 1.7–7.7)
Neutrophils Relative %: 83 %
Platelets: 163 10*3/uL (ref 150–400)
RBC: 4.19 MIL/uL — ABNORMAL LOW (ref 4.22–5.81)
RDW: 16.6 % — ABNORMAL HIGH (ref 11.5–15.5)
WBC: 9.2 10*3/uL (ref 4.0–10.5)
nRBC: 0 % (ref 0.0–0.2)

## 2022-05-26 LAB — MAGNESIUM: Magnesium: 2.2 mg/dL (ref 1.7–2.4)

## 2022-05-26 LAB — PROTIME-INR
INR: 1.5 — ABNORMAL HIGH (ref 0.8–1.2)
Prothrombin Time: 17.9 seconds — ABNORMAL HIGH (ref 11.4–15.2)

## 2022-05-26 LAB — HEPARIN LEVEL (UNFRACTIONATED): Heparin Unfractionated: <0.1 IU/mL — ABNORMAL LOW (ref 0.30–0.70)

## 2022-05-26 LAB — PHOSPHORUS: Phosphorus: 3.4 mg/dL (ref 2.5–4.6)

## 2022-05-26 MED ORDER — CYANOCOBALAMIN 500 MCG PO TABS
500.0000 ug | ORAL_TABLET | Freq: Every day | ORAL | 3 refills | Status: DC
  Filled 2022-05-26: qty 30, 30d supply, fill #0

## 2022-05-26 MED ORDER — FUROSEMIDE 20 MG PO TABS: 20.0000 mg | ORAL_TABLET | Freq: Two times a day (BID) | ORAL | Status: DC

## 2022-05-26 MED ORDER — APIXABAN 5 MG PO TABS
5.0000 mg | ORAL_TABLET | Freq: Two times a day (BID) | ORAL | 1 refills | Status: DC
  Filled 2022-05-26: qty 60, 30d supply, fill #0

## 2022-05-26 MED ORDER — POLYETHYLENE GLYCOL 3350 17 GM/SCOOP PO POWD
17.0000 g | Freq: Every day | ORAL | 0 refills | Status: DC | PRN
  Filled 2022-05-26: qty 238, 14d supply, fill #0

## 2022-05-26 MED ORDER — APIXABAN 5 MG PO TABS
5.0000 mg | ORAL_TABLET | Freq: Two times a day (BID) | ORAL | Status: DC
  Administered 2022-05-26: 5 mg via ORAL
  Filled 2022-05-26: qty 1

## 2022-05-26 MED ORDER — IRON (FERROUS SULFATE) 325 (65 FE) MG PO TABS
1.0000 | ORAL_TABLET | Freq: Two times a day (BID) | ORAL | 1 refills | Status: DC
  Filled 2022-05-26: qty 60, fill #0

## 2022-05-26 MED ORDER — FUROSEMIDE 20 MG PO TABS
20.0000 mg | ORAL_TABLET | Freq: Two times a day (BID) | ORAL | 1 refills | Status: DC
  Filled 2022-05-26: qty 60, 30d supply, fill #0

## 2022-05-26 MED FILL — Heparin Sodium (Porcine) Inj 1000 Unit/ML: INTRAMUSCULAR | Qty: 10 | Status: AC

## 2022-05-26 MED FILL — Verapamil HCl IV Soln 2.5 MG/ML: INTRAVENOUS | Qty: 2 | Status: AC

## 2022-05-26 NOTE — Progress Notes (Addendum)
Rounding Note    Patient Name: [name] Date of Encounter: 05/26/2022  [facility] Cardiologist: Verne Carrow, MD   Subjective   Feeling well this morning. No chest pain or shortness of breath.   Inpatient Medications    Scheduled Meds:  atorvastatin  20 mg Oral Daily   metoprolol tartrate  25 mg Oral BID   sodium chloride flush  3 mL Intravenous Q12H   Continuous Infusions:  sodium chloride     heparin 1,100 Units/hr (05/25/22 2354)   PRN Meds: sodium chloride, acetaminophen **OR** acetaminophen, polyethylene glycol   Vital Signs    Vitals:   05/25/22 2235 05/26/22 0540 05/26/22 0853 05/26/22 0856  BP: 114/83 123/79  96/73  Pulse: (!) 116 (!) 107  (!) 108  Resp: Temp:  99.1 F (37.3 C)    TempSrc:  Oral    SpO2:  95%    Weight:  97.6 kg    Height:        Intake/Output Summary (Last 24 hours) at 05/26/2022 0903 Last data filed at 05/25/2022 2354 Gross per 24 hour  Intake 435.19 ml  Output 275 ml  Net 160.19 ml      05/26/2022    5:40 AM 05/25/2022    5:43 AM 05/25/2022    3:24 AM  Last 3 Weights  Weight (lbs) 215 lb 1.6 oz 212 lb 14.4 oz 213 lb 9.6 oz  Weight (kg) 97.569 kg 96.571 kg 96.888 kg      Telemetry    AV/ V paced - Personally Reviewed  ECG    No new tracing this morning  Physical Exam   GEN: No acute distress.   Neck: No JVD Cardiac: RRR, + systolic murmur at apex, no rubs, or gallops.  Respiratory: Clear to auscultation bilaterally. GI: Soft, nontender, non-distended  MS: No edema; No deformity. Neuro:  Nonfocal  Psych: Normal affect   Labs    High Sensitivity Troponin:   Recent Labs  Lab 05/23/22 1126 05/23/22 1351 05/24/22 0826 05/24/22 1016  TROPONINIHS 185* 178* 88* 72*     Chemistry Recent Labs  Lab 05/24/22 0826 05/25/22 0219 05/25/22 1503 05/26/22 0531  NA 131* 132* 140 131*  K 3.4* 4.5 3.7 4.0  CL 101 103  --  102  CO2 20* 20*  --  19*  GLUCOSE 156* 104*  --  117*   BUN 10 11  --  13  CREATININE 0.92 0.92  --  1.01  CALCIUM 8.0* 8.4*  --  8.1*  MG 1.9 2.6*  --  2.2  PROT 6.0* 6.8  --  6.4*  ALBUMIN 2.3* 2.6*  --  2.5*  AST 27 31  --  33  ALT 17 18  --  20  ALKPHOS 76 93  --  99  BILITOT 1.3* 1.6*  --  1.4*  GFRNONAA >60 >60  --  >60  ANIONGAP 10 9  --  10    Lipids  Recent Labs  Lab 05/25/22 0219  CHOL 77  TRIG 73  HDL 25*  LDLCALC 37  CHOLHDL 3.1    Hematology Recent Labs  Lab 05/24/22 0826 05/25/22 0219 05/25/22 1503 05/26/22 0531  WBC 8.0 8.8  --  9.2  RBC 3.83*  3.78* 4.38  --  4.19*  HGB 9.7* 11.3* 10.9* 10.8*  HCT 31.2* 35.7* 32.0* 33.8*  MCV 81.5 81.5  --  80.7  MCH 25.3* 25.8*  --  25.8*  MCHC  31.1 31.7  --  32.0  RDW 16.4* 16.3*  --  16.6*  PLT 134* 145*  --  163   Thyroid No results for input(s): "TSH", "FREET4" in the last 168 hours.  BNP Recent Labs  Lab 05/23/22 1126  BNP 312.7*    DDimer No results for input(s): "DDIMER" in the last 168 hours.   Radiology    CARDIAC CATHETERIZATION  Result Date: 05/25/2022   Prox LAD lesion is 40% stenosed.   Mid LAD lesion is 95% stenosed.   Prox Cx to Mid Cx lesion is 30% stenosed.   1st Mrg lesion is 30% stenosed.   3rd Mrg lesion is 30% stenosed.   Prox RCA lesion is 100% stenosed.   SVG graft was visualized by angiography and is normal in caliber.   LIMA graft was visualized by angiography and is normal in caliber. Severe mid LAD stenosis. Competitive filling is seen distally from the patent LIMA graft. Patent LIMA graft to the mid LAD. Mild non-obstructive disease in the Circumflex artery. Large dominant RCA with chronic proximal occlusion. The mid and distal RCA fills from the patent vein graft. Near normal right and left heart pressures. CO 6.92 L/min CI 3.33 Recommendations: His symptoms are likely related to his severe TR. He does not appear to need further diuresis. Medical management of CAD. Consider Advanced Heart Failure team consult.    Cardiac Studies    Echo: 05/23/2022   IMPRESSIONS     1. Right ventricular systolic function is moderately reduced. The right  ventricular size is severely enlarged.   2. Left ventricular ejection fraction, by estimation, is 55 to 60%. The  left ventricle has normal function. The left ventricle has no regional  wall motion abnormalities. Left ventricular diastolic parameters are  indeterminate. There is the  interventricular septum is flattened in systole, consistent with right  ventricular pressure overload and the interventricular septum is flattened  in diastole ('D' shaped left ventricle), consistent with right ventricular  volume overload.   3. The mitral valve has been repaired/replaced. Trivial mitral valve  regurgitation. 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   4. Tricuspid valve regurgitation is severe.   5. The aortic valve has been repaired/replaced. Aortic valve  regurgitation is not visualized. Echo findings are consistent with normal  structure and function of the aortic valve prosthesis. Aortic valve mean  gradient measures 7.0 mmHg. Aortic valve Vmax   measures 1.78 m/s. DI 0.5.   6. Left atrial size was severely dilated.   7. Right atrial size was severely dilated.   Comparison(s): Compared to prior TTE in 06/2019, the RV is now severely  dilated and moderately hypokinetic with severe TR.   FINDINGS   Left Ventricle: Left ventricular ejection fraction, by estimation, is 55  to 60%. The left ventricle has normal function. The left ventricle has no  regional wall motion abnormalities. Definity contrast agent was given IV  to delineate the left ventricular   endocardial borders. The left ventricular internal cavity size was small.  There is no left ventricular hypertrophy. The interventricular septum is  flattened in systole, consistent with right ventricular pressure overload  and the interventricular septum  is  flattened in diastole ('D' shaped left ventricle), consistent with  right ventricular volume overload. Left ventricular diastolic parameters  are indeterminate.   Right Ventricle: The right ventricular size is severely enlarged. Right  vetricular wall thickness was not well  visualized. Right ventricular  systolic function is moderately reduced.   Left Atrium: Left atrial size was severely dilated.   Right Atrium: Right atrial size was severely dilated.   Pericardium: There is no evidence of pericardial effusion.   Mitral Valve: There is at least moderate prosthetic valve stenosis with  mean gradient at HR 95bpm. The mitral valve has been  repaired/replaced. Trivial mitral valve regurgitation. There is a 27 mm  [employer] bioprosthetic valve present in the  mitral position. MV peak gradient, 16.8 mmHg. The mean mitral valve  gradient is 11.0 mmHg.   Tricuspid Valve: The tricuspid valve is grossly normal. Tricuspid valve  regurgitation is severe.   Aortic Valve: DI 0.51. The aortic valve has been repaired/replaced. Aortic  valve regurgitation is not visualized. Aortic valve mean gradient measures  7.0 mmHg. Aortic valve peak gradient measures 12.7 mmHg. There is a 23 mm  bioprosthetic valve present in   the aortic position. Echo findings are consistent with normal structure  and function of the aortic valve prosthesis.   Pulmonic Valve: The pulmonic valve was normal in structure. Pulmonic valve  regurgitation is trivial.   Aorta: The aortic root is normal in size and structure.   IAS/Shunts: The atrial septum is grossly normal.   Additional Comments: A device lead is visualized.    Cath: 05/25/2022    Prox LAD lesion is 40% stenosed.   Mid LAD lesion is 95% stenosed.   Prox Cx to Mid Cx lesion is 30% stenosed.   1st Mrg lesion is 30% stenosed.   3rd Mrg lesion is 30% stenosed.   Prox RCA lesion is 100% stenosed.   SVG graft was visualized by angiography and is  normal in caliber.   LIMA graft was visualized by angiography and is normal in caliber.   Severe mid LAD stenosis. Competitive filling is seen distally from the patent LIMA graft.  Patent LIMA graft to the mid LAD.  Mild non-obstructive disease in the Circumflex artery.  Large dominant RCA with chronic proximal occlusion. The mid and distal RCA fills from the patent vein graft.  Near normal right and left heart pressures.  CO 6.92 L/min CI 3.33   Recommendations: His symptoms are likely related to his severe TR. He does not appear to need further diuresis. Medical management of CAD. Consider Advanced Heart Failure team consult.    Diagnostic Dominance: Right   Patient Profile     72 y.o. male with a hx of CAD s/p 2V CABG in 2020, aortic stenosis s/p AVR in 2020, mitral stenosis s/p MVR in 2020, symptomatic bradycardia s/p pacemaker placement, atrial flutter, tobacco abuse, hyperlipidemia, prostate cancer and esophageal stricture  who was seen 05/23/2022 for the evaluation of DOE at the request of Dr. Renaye Rakers.   Assessment & Plan    Generalized Malaise CAD s/p 2v CABG -- presented with generalized malaise, no reports of chest pain. Story is somewhat vague. Mild dyspnea. hsTn 185>>178. Mostly complains of left leg pain and weakness which has limited his activity. Had fluid drained off the left knee several weeks ago. Cath noted above with patent grafts, continue medical therapy.  -- continue statin, metoprolol    Dyspnea Hx of severe AS and MS s/p AVR/MVR Moderate to Severe RV enlargement  -- bioprosthetic AVR and MVR with CABG in 2020 -- echo this admission shows normal aortic function and trivial MR -- worsening TR from moderate on prior echo 06/2019 to severe this admission -- RV now severely enlarged,  with LV under filled -- BNP 312, given IV lasix 40mg  x1, net -1.2L UOP  -- cath noted above with severe TR, normal R/L heart pressures. Will plan for outpatient referral to the AHF  clinic -- would plan for lasix 40mg  PO PRN at discharge   HLD -- LDL 37, HDL 25 -- continue statin   CHB -- s/p PPM -- follows with EP   Hx of atrial flutter -- remains in sinus rhythm, Vpaced on telemetry -- was on coumadin PTA, agreeable to transition to Eliquis 5mg  BID at discharge    Recent Weight loss -- unclear etiology for the past several months -- undergoing work up as an outpatient with GI and planned for colonoscopy     For questions or updates, please contact  [facility] Please consult [email or web address] for contact info under        Signed, Laverda Page, NP  05/26/2022, 9:03 AM     ATTENDING ATTESTATION:  After conducting a review of all available clinical information with the care team, interviewing the patient, and performing a physical exam, I agree with the findings and plan described in this note.   GEN: No acute distress.   HEENT:  MMM, no JVD, no scleral icterus Cardiac: RRR, systolic murmur, rubs, or gallops.  Respiratory: Clear to auscultation bilaterally. GI: Soft, nontender, non-distended  MS: No edema; No deformity. Neuro:  Nonfocal  Vasc:  +2 radial pulses  Patient underwent right and left heart catheterization which demonstrated patent grafts relatively normal filling pressures with no increase in PVR.  Given his RV dysfunction and severe TR we will plan on the patient seeing advanced heart failure.  We will send home on Lasix 20 mg twice daily to unload the right ventricle.  Replace coumadin with Eliquis.  Will sign off, call with questions.  Alverda Skeans, MD Pager [phone]

## 2022-05-26 NOTE — TOC Initial Note (Signed)
Transition of Care [facility]) - Initial/Assessment Note    Patient Details  Name: [name] MRN: [id number] Date of Birth: November 07, 1950  [facility] CM/SW Contact:    Gala Lewandowsky, RN Phone Number: 05/26/2022, 2:34 PM  Clinical Narrative: Patient presented for dyspnea on exertion. Plan for patient to transition home today with Home Health Services. Case Manager discussed Medicare.gov list with the patient and he chose [facility] (has used them in the past). Referral submitted and start of care to begin within 24-48 hours post transition home. Family to provide transportation home.                   Expected Discharge Plan: Home w Home Health Services Barriers to Discharge: No Barriers Identified   Patient Goals and CMS Choice Patient states their goals for this hospitalization and ongoing recovery are:: to return home. CMS Medicare.gov Compare Post Acute Care list provided to:: Patient Choice offered to / list presented to : Patient     Expected Discharge Plan and Services In-house Referral: NA Discharge Planning Services: CM Consult Post Acute Care Choice: Home Health Living arrangements for the past 2 months: Single Family Home Expected Discharge Date: 05/26/22               DME Arranged: N/A DME Agency: NA       HH Arranged: PT HH Agency: [facility]        Prior Living Arrangements/Services Living arrangements for the past 2 months: Single Family Home Lives with:: Self Patient language and need for interpreter reviewed:: Yes Do you feel safe going back to the place where you live?: Yes      Need for Family Participation in Patient Care: No (Comment) Care giver support system in place?: No (comment)   Criminal Activity/Legal Involvement Pertinent to Current Situation/Hospitalization: No - Comment as needed  Activities of Daily Living Home Assistive Devices/Equipment: Eyeglasses ADL Screening (condition at time of  admission) Patient's cognitive ability adequate to safely complete daily activities?: Yes Is the patient deaf or have difficulty hearing?: Yes Does the patient have difficulty seeing, even when wearing glasses/contacts?: No Does the patient have difficulty concentrating, remembering, or making decisions?: No Patient able to express need for assistance with ADLs?: Yes Does the patient have difficulty dressing or bathing?: No Independently performs ADLs?: Yes (appropriate for developmental age) Does the patient have difficulty walking or climbing stairs?: Yes Weakness of Legs: Left Weakness of Arms/Hands: None  Permission Sought/Granted Permission sought to share information with : Family Supports, Magazine features editor, Case Automotive engineer granted to share info w AGENCY: [facility]        Emotional Assessment Appearance:: Appears stated age Attitude/Demeanor/Rapport: Engaged Affect (typically observed): Appropriate Orientation: : Oriented to Situation, Oriented to  Time, Oriented to Place, Oriented to Self Alcohol / Substance Use: Not Applicable Psych Involvement: No (comment)  Admission diagnosis:  Dyspnea on exertion [R06.09] Demand ischemia [I24.89] Patient Active Problem List   Diagnosis Date Noted   Demand ischemia 05/25/2022   NSTEMI (non-ST elevated myocardial infarction) 05/23/2022   Senile purpura 05/18/2022   Atrial flutter 09/02/2021   Atherosclerosis of aorta 04/30/2019   Actinic keratosis 04/30/2019   Malignant neoplasm of prostate 02/06/2019   Complete heart block 01/18/2019   Pacemaker 01/18/2019   Encounter for therapeutic drug monitoring 10/23/2018   S/P aortic + mitral valve replacement with bioprosthetic valves 10/11/2018   S/P CABG x 2 10/11/2018  S/P patent foramen ovale closure 10/11/2018   Incidental pulmonary nodule, > 3mm and < 8mm 10/04/2018   Coronary artery disease involving native coronary artery of native heart without angina  pectoris    Mixed hyperlipidemia 06/15/2017   Hypogonadism in male 11/05/2015   Former smoker 12/27/2011   Obesity (BMI 30-39.9) 12/27/2011   History of esophageal stricture 12/27/2011   PCP:  [name], MD Pharmacy:   [facility], [place] - [address] [place] [address] Phone: [phone] Fax: [phone]  [facility] [address] [place] [address] Phone: [phone] Fax: [phone]  Social Determinants of Health (SDOH) Social History: SDOH Screenings   Food Insecurity: No Food Insecurity (05/23/2022)  Housing: Low Risk  (05/23/2022)  Transportation Needs: No Transportation Needs (05/23/2022)  Utilities: Not At Risk (05/23/2022)  Depression (PHQ2-9): Low Risk  (05/11/2022)  Financial Resource Strain: Low Risk  (05/11/2022)  Physical Activity: Inactive (05/11/2022)  Stress: No Stress Concern Present (05/11/2022)  Tobacco Use: Medium Risk (05/26/2022)    Readmission Risk Interventions     No data to display

## 2022-05-26 NOTE — Plan of Care (Signed)

## 2022-05-26 NOTE — Discharge Instructions (Signed)

## 2022-05-26 NOTE — Evaluation (Signed)
Physical Therapy Evaluation Patient Details Name: [name] MRN: [id number] DOB: Jan 12, 1951 Today's Date: 05/26/2022  History of Present Illness  72 yo admitted on 4/14 for SOB with worsening with exertion.  Has elevation of HR and INR, received heart cath 4/16.  Dx with NSTEMI, CHF, low K+, and L knee pain complicating.  PMHx:  anemia, pacemaker, PFO closure, LAA clip, complete heart block, a-flutter, HLD, prostate CA,  Clinical Impression  Pt was seen for mobility on RW with help initially to walk with no device.  Has a significant limp on LLE due to knee pain, and was amenable to using AD for managing the discomfort. He is much steadier with assistance, and is able to walk with telemetry readings of 114-118 HR.  Pt is still at those values at rest today.  Follow up with him to get HHPT started and transition to outpatient therapy to manage L knee pain and dysfunction.  Will follow goals for acute PT as are outlined below.        Recommendations for follow up therapy are one component of a multi-disciplinary discharge planning process, led by the attending physician.  Recommendations may be updated based on patient status, additional functional criteria and insurance authorization.  Follow Up Recommendations       Assistance Recommended at Discharge Intermittent Supervision/Assistance  Patient can return home with the following  A little help with walking and/or transfers;Assistance with cooking/housework;Assist for transportation;Help with stairs or ramp for entrance    Equipment Recommendations None recommended by PT  Recommendations for Other Services       Functional Status Assessment Patient has had a recent decline in their functional status and demonstrates the ability to make significant improvements in function in a reasonable and predictable amount of time.     Precautions / Restrictions Precautions Precautions: Fall Precaution Comments: monitor HR  and Restrictions Weight Bearing Restrictions: No      Mobility  Bed Mobility Overal bed mobility: Modified Independent                  Transfers Overall transfer level: Modified independent Equipment used: None                    Ambulation/Gait Ambulation/Gait assistance: Min guard Gait Distance (Feet): 100 Feet Assistive device: Rolling walker (2 wheels) Gait Pattern/deviations: Step-through pattern, Narrow base of support, Decreased stride length Gait velocity: reduced Gait velocity interpretation: <1.31 ft/sec, indicative of household ambulator Pre-gait activities: standing Balance ck General Gait Details: Pt is walking without SOB but has HR from 114 to 118 on portable telemetry  Stairs            Wheelchair Mobility    Modified Rankin (Stroke Patients Only)       Balance Overall balance assessment: Needs assistance Sitting-balance support: Feet supported Sitting balance-Leahy Scale: Good     Standing balance support: Bilateral upper extremity supported, During functional activity Standing balance-Leahy Scale: Fair Standing balance comment: less than fair dynamically d/t knee pain                             Pertinent Vitals/Pain Pain Assessment Pain Assessment: Faces Faces Pain Scale: Hurts little more Pain Location: L knee to walk Pain Descriptors / Indicators: Grimacing, Guarding Pain Intervention(s): Limited activity within patient's tolerance, Monitored during session, Premedicated before session, Repositioned (added RW)    Home Living Family/patient expects to be discharged to:: Private residence  Living Arrangements: Spouse/significant other Available Help at Discharge: Family;Available PRN/intermittently Type of Home: House Home Access: Stairs to enter Entrance Stairs-Rails: Can reach both Entrance Stairs-Number of Steps: 2   Home Layout: One level Home Equipment: Agricultural consultant (2 wheels)      Prior  Function Prior Level of Function : Independent/Modified Independent             Mobility Comments: no AD needed       Hand Dominance   Dominant Hand: Right    Extremity/Trunk Assessment   Upper Extremity Assessment Upper Extremity Assessment: Overall WFL for tasks assessed    Lower Extremity Assessment Lower Extremity Assessment: LLE deficits/detail LLE Deficits / Details: pain from OA LLE Coordination: decreased gross motor    Cervical / Trunk Assessment Cervical / Trunk Assessment: Normal  Communication   Communication: No difficulties  Cognition Arousal/Alertness: Awake/alert Behavior During Therapy: WFL for tasks assessed/performed Overall Cognitive Status: Within Functional Limits for tasks assessed                                          General Comments General comments (skin integrity, edema, etc.): Pt was seen for mobility on RW with help to balance and manage HR, pain on knee.  Pt is able to walk but did trend up on HR    Exercises     Assessment/Plan    PT Assessment Patient needs continued PT services  PT Problem List Decreased strength;Decreased balance;Decreased activity tolerance;Decreased mobility;Decreased coordination;Cardiopulmonary status limiting activity;Pain       PT Treatment Interventions DME instruction;Gait training;Functional mobility training;Therapeutic activities;Therapeutic exercise;Balance training;Neuromuscular re-education;Patient/family education    PT Goals (Current goals can be found in the Care Plan section)  Acute Rehab PT Goals Patient Stated Goal: none stated PT Goal Formulation: With patient/family Time For Goal Achievement: 06/02/22 Potential to Achieve Goals: Good    Frequency Min 3X/week     Co-evaluation               AM-PAC PT "6 Clicks" Mobility  Outcome Measure Help needed turning from your back to your side while in a flat bed without using bedrails?: None Help needed moving  from lying on your back to sitting on the side of a flat bed without using bedrails?: A Little Help needed moving to and from a bed to a chair (including a wheelchair)?: A Little Help needed standing up from a chair using your arms (e.g., wheelchair or bedside chair)?: A Little Help needed to walk in hospital room?: A Little Help needed climbing 3-5 steps with a railing? : A Little 6 Click Score: 19    End of Session   Activity Tolerance: Patient limited by pain Patient left: in bed;with call bell/phone within reach;with family/visitor present Nurse Communication: Mobility status PT Visit Diagnosis: Unsteadiness on feet (R26.81);Pain;Muscle weakness (generalized) (M62.81) Pain - Right/Left: Left Pain - part of body: Knee    Time: 4098-1191 PT Time Calculation (min) (ACUTE ONLY): 18 min   Charges:   PT Evaluation $PT Eval Moderate Complexity: 1 Mod         Ivar Drape 05/26/2022, 2:25 PM  Samul Dada, PT PhD Acute Rehab Dept. Number: [facility] [id number] and [facility] [phone]

## 2022-05-26 NOTE — Progress Notes (Addendum)
ANTICOAGULATION CONSULT NOTE  Pharmacy Consult for heparin Indication: atrial fibrillation  No Known Allergies  Patient Measurements: Height:  (170.2 cm) Weight: 97.6 kg (215 lb 1.6 oz) IBW/kg (Calculated) : 66.1 Heparin Dosing Weight: 88kg  Vital Signs: Temp: 99.1 F (37.3 C) (04/17 0540) Temp Source: Oral (04/17 0540) BP: 96/73 (04/17 0856) Pulse Rate: 108 (04/17 0856)  Labs: Recent Labs    05/23/22 1126 05/23/22 1351 05/24/22 0742 05/24/22 0826 05/24/22 1016 05/24/22 1333 05/25/22 0219 05/25/22 0950 05/25/22 1503 05/26/22 0531 05/26/22 0836  HGB 10.0*  --    < > 9.7*  --   --  11.3*  --  10.9* 10.8*  --   HCT 31.6*  --    < > 31.2*  --   --  35.7*  --  32.0* 33.8*  --   PLT 156  --    < > 134*  --   --  145*  --   --  163  --   APTT 53*  --   --   --   --   --   --   --   --   --   --   LABPROT 32.7*  --   --  30.7*  --    < > 22.1* 20.0*  --  17.9*  --   INR 3.2*  --   --  3.0*  --    < > 2.0* 1.7*  --  1.5*  --   HEPARINUNFRC  --   --   --   --   --   --   --   --   --   --  <0.10*  CREATININE 1.22  --    < > 0.92  --   --  0.92  --   --  1.01  --   TROPONINIHS 185* 178*  --  88* 72*  --   --   --   --   --   --    < > = values in this interval not displayed.     Estimated Creatinine Clearance: 74.7 mL/min (by C-G formula based on SCr of 1.01 mg/dL).   Medical History: Past Medical History:  Diagnosis Date   BCE (basal cell epithelioma), arm    RIGHT SHOULDER   Bradycardia    Bumps on skin    on left side of nose for last 3 weeks   CAD (coronary artery disease)    a. s/p CABGx2 10/11/18 LIMA to LAD, SVG to RCA, EVH via right thigh.   Dyslipidemia    Ectatic thoracic aorta    Esophageal stricture    GERD (gastroesophageal reflux disease)    Heart murmur    sees dr Rose Fillers   Hyperlipidemia    Incidental pulmonary nodule, > 3mm and < 8mm 10/04/2018   Noted on CTA   Inguinal hernia    bilateral   Obesity    Presence of permanent cardiac  pacemaker    Prostate cancer    S/P aortic valve replacement with bioprosthetic valve 10/11/2018   23 mm Edwards Inspiris Resilia stented bovine pericardial tissue valve, miltral valve done also   S/P CABG x 2 10/11/2018   LIMA to LAD, SVG to RCA, EVH via right thigh   S/P mitral valve replacement with bioprosthetic valve 10/11/2018   27 mm [employer] Mosaic stented porcine bioprosthetic tissue valve   S/P patent foramen ovale closure 10/11/2018   S/P placement of cardiac pacemaker  Smoker     Assessment: 9 yoM admitted with DOE. Pt with hx bMVR/AVR and AF on warfarin PTA. Warfarin held on admit, and now on heparin. Patient is s/p R/LHC   -heparin level < 0.1 on 1100 units/hr -CBC stable -plans are to change to apixaban if no further plans for procedures  Goal of Therapy:  Heparin level 0.3-0.7 units/ml Monitor platelets by anticoagulation protocol: Yes   Plan:  -Increase heparin to 1350 units/hr -Heparin level in 8 hours and daily wth CBC daily  Harland German, PharmD Clinical Pharmacist **Pharmacist phone directory can now be found on [email or web address] ([email or web address]).  Listed under [facility].  Addendum -changing to apixaban (cost is $45 per month)  Plan -apixaban 5mg  po bid -He has been educated on the new medication  Harland German, PharmD Clinical Pharmacist **Pharmacist phone directory can now be found on [email or web address] ([email or web address]).  Listed under [facility].

## 2022-05-26 NOTE — Discharge Summary (Signed)
Physician Discharge Summary   Patient: [name] MRN: [id number] DOB: 22-Jun-1950  Admit date:     05/23/2022  Discharge date: 05/26/22  Discharge Physician: Kathlen Mody   PCP: [name], MD   Recommendations at discharge:  Please follow up with advanced heart failure clinic for further recommendations.  Please check cbc and bmp in one week.  Please follow upw ith PCP in one to two weeks.   Discharge Diagnoses: Principal Problem:   NSTEMI (non-ST elevated myocardial infarction) Active Problems:   S/P patent foramen ovale closure   Obesity (BMI 30-39.9)   Mixed hyperlipidemia   Coronary artery disease involving native coronary artery of native heart without angina pectoris   S/P aortic + mitral valve replacement with bioprosthetic valves   S/P CABG x 2   Complete heart block   Pacemaker   Malignant neoplasm of prostate   Atrial flutter   Demand ischemia    Hospital Course: [name] is a 72 y.o. male with medical history significant of CAD status post CABG x 2, status post left atrial appendage clipping, status post foramen ovale closure, status post aortic and mitral valve replacement with bioprosthetic, hyperlipidemia, complete heart block status post pacemaker, atrial flutter, obesity, prostate cancer presenting with dyspnea on exertion.  roponin elevated to 185 with repeat pending. BNP borderline at 312 . Chest x-ray showed no acute abnormality. Cardiology consulted and recommended echocardiogram,   Assessment and Plan:   Acute on chronic diastolic heart failure from severe TR, enlarged RV with RV pressure overload . Started the patient on lasix 20 mg BID.  Echocardiogram reviewed and discussed with the patient.       Complete heart blcok/ Atrial flutter S/p PPM.  Rate control with metoprolol and on coumadin at home for anti coagulation, which was changed to eliquis for discharge.      Hyponatremia:  From fluid overload.      Hyperlipidemia:   On statin. Lipitor 20 mg daily.   Hypokalemia and hypomagnesemia  Replaced.    Anemia of chronic disease:  Iron deficiency on anemia panel. Add iron supplementation and vit b12 supplements on discharge.  Hemoglobin stable around 10      Consultants: cardiology Procedures performed: echo.  Cath.   Disposition: Home Diet recommendation:  Discharge Diet Orders (From admission, onward)     Start     Ordered   05/26/22 0000  Diet - low sodium heart healthy        05/26/22 1337           Cardiac diet DISCHARGE MEDICATION: Allergies as of 05/26/2022   No Known Allergies      Medication List     STOP taking these medications    amoxicillin 500 MG capsule Commonly known as: AMOXIL   warfarin 7.5 MG tablet Commonly known as: COUMADIN       TAKE these medications    acetaminophen 325 MG tablet Commonly known as: TYLENOL Take 650 mg by mouth as needed for moderate pain.   apixaban 5 MG Tabs tablet Commonly known as: ELIQUIS Take 1 tablet (5 mg total) by mouth 2 (two) times daily.   atorvastatin 20 MG tablet Commonly known as: LIPITOR Take 1 tablet (20 mg total) by mouth daily.   furosemide 20 MG tablet Commonly known as: LASIX Take 1 tablet (20 mg total) by mouth 2 (two) times daily.   metoprolol tartrate 25 MG tablet Commonly known as: LOPRESSOR TAKE 1 TABLET BY MOUTH 2 TIMES  A DAY   oxymetazoline 0.05 % nasal spray Commonly known as: AFRIN Place 1 spray into both nostrils at bedtime as needed for congestion.   polyethylene glycol powder 17 GM/SCOOP powder Commonly known as: GLYCOLAX/MIRALAX Take 17 g by mouth daily as needed for mild constipation.        Discharge Exam: Filed Weights   05/25/22 0324 05/25/22 0543 05/26/22 0540  Weight: 96.9 kg 96.6 kg 97.6 kg   General exam: Appears calm and comfortable  Respiratory system: Clear to auscultation. Respiratory effort normal. Cardiovascular system: S1 & S2 heard, RRR. No  JVD, Gastrointestinal system: Abdomen is nondistended, soft and nontender.  Central nervous system: Alert and oriented. No focal neurological deficits. Extremities: Symmetric 5 x 5 power. Skin: No rashes, lesions or ulcers Psychiatry: Mood & affect appropriate.    Condition at discharge: fair  The results of significant diagnostics from this hospitalization (including imaging, microbiology, ancillary and laboratory) are listed below for reference.   Imaging Studies: CARDIAC CATHETERIZATION  Result Date: 05/25/2022   Prox LAD lesion is 40% stenosed.   Mid LAD lesion is 95% stenosed.   Prox Cx to Mid Cx lesion is 30% stenosed.   1st Mrg lesion is 30% stenosed.   3rd Mrg lesion is 30% stenosed.   Prox RCA lesion is 100% stenosed.   SVG graft was visualized by angiography and is normal in caliber.   LIMA graft was visualized by angiography and is normal in caliber. Severe mid LAD stenosis. Competitive filling is seen distally from the patent LIMA graft. Patent LIMA graft to the mid LAD. Mild non-obstructive disease in the Circumflex artery. Large dominant RCA with chronic proximal occlusion. The mid and distal RCA fills from the patent vein graft. Near normal right and left heart pressures. CO 6.92 L/min CI 3.33 Recommendations: His symptoms are likely related to his severe TR. He does not appear to need further diuresis. Medical management of CAD. Consider Advanced Heart Failure team consult.   CT KNEE LEFT W CONTRAST  Result Date: 05/24/2022 CLINICAL DATA:  Left leg pain and weakness. Had a knee aspiration several weeks ago. Possible pseudoaneurysm seen in the popliteal fossa on DVT study. EXAM: CT OF THE LEFT KNEE WITH CONTRAST TECHNIQUE: Multidetector CT imaging was performed following the standard protocol during bolus administration of intravenous contrast. RADIATION DOSE REDUCTION: This exam was performed according to the departmental dose-optimization program which includes automated exposure  control, adjustment of the mA and/or kV according to patient size and/or use of iterative reconstruction technique. CONTRAST:  OMNIPAQUE IOHEXOL 350 MG/ML SOLN COMPARISON:  None Available. FINDINGS: Bones/Joint/Cartilage No fracture or dislocation. Moderate to severe medial compartment joint space narrowing with near bone-on-bone apposition medially. The patellofemoral and lateral compartment joint spaces are relatively preserved. Small to moderate joint effusion. Fluid extends into the popliteus tendon sheath. No Baker cyst. Ligaments Ligaments are suboptimally evaluated by CT. Muscles and Tendons Grossly intact.  No significant muscle atrophy. Soft tissue 2.3 x 1.9 x 2.1 cm cystic lesion with rim enhancement posterior to the lateral femoral condyle and lateral to the popliteal vessels, likely a ganglion cyst. No pseudoaneurysm. No worrisome soft tissue mass. IMPRESSION: 1. Moderate to severe medial compartment osteoarthritis with small to moderate joint effusion. 2. 2.3 cm ganglion cyst posterior to the lateral femoral condyle and lateral to the popliteal vessels. No pseudoaneurysm or worrisome soft tissue mass. Electronically Signed   By: Obie Dredge M.D.   On: 05/24/2022 09:18   VAS [place] LOWER EXTREMITY  VENOUS (DVT) (7a-7p)  Result Date: 05/24/2022  Lower Venous DVT Study Patient Name:  [name]  Date of Exam:   05/23/2022 Medical Rec #: [id number]       Accession #:    [id number] Date of Birth: [birth year]       Patient Gender: M Patient Age:   63 years Exam Location:  [facility] Procedure:      VAS [place] LOWER EXTREMITY VENOUS (DVT) Referring Phys: Harlene Salts --------------------------------------------------------------------------------  Indications: Left leg pain with walking. Other Indications: Patient had fluid drained off knee 05/05/22. Comparison Study: No prior study on file Performing Technologist: Sherren Kerns RVS  Examination Guidelines: A complete evaluation includes  B-mode imaging, spectral Doppler, color Doppler, and power Doppler as needed of all accessible portions of each vessel. Bilateral testing is considered an integral part of a complete examination. Limited examinations for reoccurring indications may be performed as noted. The reflux portion of the exam is performed with the patient in reverse Trendelenburg.  +---------+---------------+---------+-----------+----------+--------------+ RIGHT    CompressibilityPhasicitySpontaneityPropertiesThrombus Aging +---------+---------------+---------+-----------+----------+--------------+ CFV      Full           Yes      Yes                                 +---------+---------------+---------+-----------+----------+--------------+ SFJ      Full                                                        +---------+---------------+---------+-----------+----------+--------------+ FV Prox  Full                                                        +---------+---------------+---------+-----------+----------+--------------+ FV Mid   Full           Yes      Yes                                 +---------+---------------+---------+-----------+----------+--------------+ FV DistalFull                                                        +---------+---------------+---------+-----------+----------+--------------+ PFV      Full                                                        +---------+---------------+---------+-----------+----------+--------------+ POP      Full           Yes      Yes                                 +---------+---------------+---------+-----------+----------+--------------+ PTV  Full                                                        +---------+---------------+---------+-----------+----------+--------------+ PERO     Full                                                         +---------+---------------+---------+-----------+----------+--------------+   +---------+---------------+---------+-----------+----------+--------------+ LEFT     CompressibilityPhasicitySpontaneityPropertiesThrombus Aging +---------+---------------+---------+-----------+----------+--------------+ CFV      Full           Yes      Yes                                 +---------+---------------+---------+-----------+----------+--------------+ SFJ      Full                                                        +---------+---------------+---------+-----------+----------+--------------+ FV Prox  Full                                                        +---------+---------------+---------+-----------+----------+--------------+ FV Mid   Full           Yes      Yes                                 +---------+---------------+---------+-----------+----------+--------------+ FV DistalFull                                                        +---------+---------------+---------+-----------+----------+--------------+ PFV      Full                                                        +---------+---------------+---------+-----------+----------+--------------+ POP      Full           Yes      Yes                                 +---------+---------------+---------+-----------+----------+--------------+ PTV      Full                                                        +---------+---------------+---------+-----------+----------+--------------+  PERO     Full                                                        +---------+---------------+---------+-----------+----------+--------------+     Summary: RIGHT: - There is no evidence of deep vein thrombosis in the lower extremity.  - No cystic structure found in the popliteal fossa.  LEFT: - There is no evidence of deep vein thrombosis in the lower extremity.  - There appears to be, what may be consistent with,  a partially thrombosed pseudoaneurysm in the popliteal fossa/proximal calf area adjacent to an effusion filled with mixed echoes. Further imaging should be considered to differentiate/classify findings.  *See table(s) above for measurements and observations. Electronically signed by Coral Else MD on 05/24/2022 at 8:05:33 AM.    Final    ECHOCARDIOGRAM COMPLETE  Result Date: 05/23/2022    ECHOCARDIOGRAM REPORT   Patient Name:   [name] Date of Exam: 05/23/2022 Medical Rec #:  [id number]      Height:       67.0 in Accession #:    [id number]     Weight:       223.0 lb Date of Birth:  Jan 28, 1951      BSA:          2.118 m Patient Age:    71 years       BP:           106/76 mmHg Patient Gender: M              HR:           96 bpm. Exam Location:  Inpatient Procedure: 2D Echo, Cardiac Doppler, Color Doppler and Intracardiac            Opacification Agent Indications:    Dyspnea  History:        Patient has prior history of Echocardiogram examinations, most                 recent 06/13/2019. Aortic Valve Disease and Mitral Valve Disease.                 23mm Edwards Inspiris Resilia AVR, 27mm Bovine AVR.  Sonographer:    Milbert Coulter Referring Phys: [id number] Cecille Po MELVIN  Sonographer Comments: No subcostal window. Image acquisition challenging due to patient body habitus. IMPRESSIONS  1. Right ventricular systolic function is moderately reduced. The right ventricular size is severely enlarged.  2. Left ventricular ejection fraction, by estimation, is 55 to 60%. The left ventricle has normal function. The left ventricle has no regional wall motion abnormalities. Left ventricular diastolic parameters are indeterminate. There is the interventricular septum is flattened in systole, consistent with right ventricular pressure overload and the interventricular septum is flattened in diastole ('D' shaped left ventricle), consistent with right ventricular volume overload.  3. The mitral valve has been  repaired/replaced. Trivial mitral valve regurgitation. There is at least moderate mitral valve prosthetic stenosis with a gradient is 11.0 mmHg at HR 95bpm.  4. Tricuspid valve regurgitation is severe.  5. The aortic valve has been repaired/replaced. Aortic valve regurgitation is not visualized. Echo findings are consistent with normal structure and function of the aortic valve prosthesis. Aortic valve mean gradient measures 7.0 mmHg. Aortic valve Vmax  measures 1.78 m/s. DI 0.5.  6.  Left atrial size was severely dilated.  7. Right atrial size was severely dilated. Comparison(s): Compared to prior TTE in 06/2019, the RV is now severely dilated and moderately hypokinetic with severe TR. FINDINGS  Left Ventricle: Left ventricular ejection fraction, by estimation, is 55 to 60%. The left ventricle has normal function. The left ventricle has no regional wall motion abnormalities. Definity contrast agent was given IV to delineate the left ventricular  endocardial borders. The left ventricular internal cavity size was small. There is no left ventricular hypertrophy. The interventricular septum is flattened in systole, consistent with right ventricular pressure overload and the interventricular septum is flattened in diastole ('D' shaped left ventricle), consistent with right ventricular volume overload. Left ventricular diastolic parameters are indeterminate. Right Ventricle: The right ventricular size is severely enlarged. Right vetricular wall thickness was not well visualized. Right ventricular systolic function is moderately reduced. Left Atrium: Left atrial size was severely dilated. Right Atrium: Right atrial size was severely dilated. Pericardium: There is no evidence of pericardial effusion. Mitral Valve: There is at least moderate prosthetic valve stenosis with mean gradient at HR 95bpm. The mitral valve has been repaired/replaced. Trivial mitral valve regurgitation. There is a 27 mm Medtronic bioprosthetic  valve present in the mitral position. MV peak gradient, 16.8 mmHg. The mean mitral valve gradient is 11.0 mmHg. Tricuspid Valve: The tricuspid valve is grossly normal. Tricuspid valve regurgitation is severe. Aortic Valve: DI 0.51. The aortic valve has been repaired/replaced. Aortic valve regurgitation is not visualized. Aortic valve mean gradient measures 7.0 mmHg. Aortic valve peak gradient measures 12.7 mmHg. There is a 23 mm bioprosthetic valve present in  the aortic position. Echo findings are consistent with normal structure and function of the aortic valve prosthesis. Pulmonic Valve: The pulmonic valve was normal in structure. Pulmonic valve regurgitation is trivial. Aorta: The aortic root is normal in size and structure. IAS/Shunts: The atrial septum is grossly normal. Additional Comments: A device lead is visualized.  LEFT VENTRICLE PLAX 2D LVIDd:         3.90 cm LVIDs:         3.20 cm LV PW:         1.10 cm LV IVS:        1.00 cm  RIGHT VENTRICLE RV S prime:     7.07 cm/s TAPSE (M-mode): 1.1 cm LEFT ATRIUM         Index LA diam:    5.70 cm 2.69 cm/m  AORTIC VALVE AV Vmax:           178.00 cm/s AV Vmean:          121.500 cm/s AV VTI:            0.292 m AV Peak Grad:      12.7 mmHg AV Mean Grad:      7.0 mmHg LVOT Vmax:         98.00 cm/s LVOT Vmean:        66.600 cm/s LVOT VTI:          0.158 m LVOT/AV VTI ratio: 0.54 MITRAL VALVE MV Peak grad: 16.8 mmHg  SHUNTS MV Mean grad: 11.0 mmHg  Systemic VTI: 0.16 m MV Vmax:      2.05 m/s MV Vmean:     161.0 cm/s Laurance Flatten MD Electronically signed by Laurance Flatten MD Signature Date/Time: 05/23/2022/5:39:50 PM    Final    DG Chest Port 1 View  Result Date: 05/23/2022 CLINICAL DATA:  Dyspnea on exertion. EXAM:  PORTABLE CHEST 1 VIEW COMPARISON:  May 29, 2019. FINDINGS: Stable cardiomediastinal silhouette. Lungs are clear. Left-sided pacemaker is unchanged. Sternotomy wires are noted. Bony thorax is unremarkable. IMPRESSION: No active disease.  Electronically Signed   By: Lupita Raider M.D.   On: 05/23/2022 11:52   CUP PACEART REMOTE DEVICE CHECK  Result Date: 05/17/2022 Scheduled remote reviewed. Normal device function.  There were 2 NSVT arrhythmias and an 85 second atrial arrhythmia detected Next remote 91 days. Hassell Halim, RN, CCDS, [employer]  CT Abdomen Pelvis W Contrast  Result Date: 05/08/2022 CLINICAL DATA:  72 year old with weight loss. Unintentional weight loss of 15 pounds within in 1 month with loss of appetite. History of prostate cancer. EXAM: CT ABDOMEN AND PELVIS WITH CONTRAST TECHNIQUE: Multidetector CT imaging of the abdomen and pelvis was performed using the standard protocol following bolus administration of intravenous contrast. RADIATION DOSE REDUCTION: This exam was performed according to the departmental dose-optimization program which includes automated exposure control, adjustment of the mA and/or kV according to patient size and/or use of iterative reconstruction technique. CONTRAST:  80mL ISOVUE-370 IOPAMIDOL (ISOVUE-370) INJECTION 76% COMPARISON:  CT chest, abdomen and pelvis 10/04/2018 FINDINGS: Lower chest: Stable 4 mm nodule in the posterior right lower lobe on image 53/5. Small densities along the medial left lower lobe on image 4/5 appear chronic. No acute findings at the lung bases. Post surgical changes with an aortic and mitral valve replacement. Cardiac pacer leads are partially imaged. Moderate sized hiatal hernia has minimally changed. Hepatobiliary: Stable 5 mm hypodensity in left hepatic lobe on image 17/2. No acute abnormality in the liver. Main portal venous system is patent. Normal appearance of the gallbladder. No biliary dilatation. Pancreas: Unremarkable. No pancreatic ductal dilatation or surrounding inflammatory changes. Spleen: Normal in size without focal abnormality. Adrenals/Urinary Tract: Normal adrenal glands. Normal appearance of both kidneys without hydronephrosis. No evidence  for stones or suspicious renal lesions. Normal appearance of both ureters. Wall thickening along the anterior aspect of the bladder particularly along the right side on image 86/2. Bladder wall measures approximately 8 mm in this area but the bladder is also under distended. Stomach/Bowel: Moderate sized hiatal hernia has minimally changed. Oral contrast in the small and large bowel. Normal appendix without inflammatory changes. No evidence for bowel dilatation or focal inflammation. Evidence for a duodenal diverticulum near the ampulla and pancreatic head. Vascular/Lymphatic: Aortic atherosclerosis. No enlarged abdominal or pelvic lymph nodes. Reproductive: Multiple radioactive seed implants within the prostate. Other: Bilateral inguinal hernias containing fat, left side larger than right. Prominent fat along the right inguinal canal causes some deformity along the right side of the urinary bladder and this is chronic. Negative for free fluid. Small umbilical hernia containing fat. Negative for free air. Musculoskeletal: Stable sclerosis involving the right femoral head. Stable sclerosis along the anterior left SI joint. Stable small foci of sclerosis involving ilium bilaterally. No acute bone abnormality. No suspicious osseous lesions. Mild vertebral body height loss in the lower thoracic spine appears chronic. Mild deformity along the superior endplate of T12 is also chronic. IMPRESSION: 1. No acute abnormality in the abdomen or pelvis. 2. Wall thickening along the anterior aspect of the urinary bladder. Findings are nonspecific and could be related to under distension. Consider further characterization with cystoscopy. 3. Moderate sized hiatal hernia. 4. Bilateral inguinal hernias containing fat, left side larger than right. 5. Brachytherapy implants in the prostate. 6. Aortic Atherosclerosis (ICD10-I70.0). Electronically Signed   By: Meriel Pica.D.  On: 05/08/2022 10:12    Microbiology: Results for orders  placed or performed during the hospital encounter of 04/02/19  SARS CORONAVIRUS 2 (TAT 6-24 HRS) Nasopharyngeal Nasopharyngeal Swab     Status: None   Collection Time: 04/02/19  9:48 AM   Specimen: Nasopharyngeal Swab  Result Value Ref Range Status   SARS Coronavirus 2 NEGATIVE NEGATIVE Final    Comment: (NOTE) SARS-CoV-2 target nucleic acids are NOT DETECTED. The SARS-CoV-2 RNA is generally detectable in upper and lower respiratory specimens during the acute phase of infection. Negative results do not preclude SARS-CoV-2 infection, do not rule out co-infections with other pathogens, and should not be used as the sole basis for treatment or other patient management decisions. Negative results must be combined with clinical observations, patient history, and epidemiological information. The expected result is Negative. Fact Sheet for Patients: HairSlick.no Fact Sheet for Healthcare Providers: quierodirigir.com This test is not yet approved or cleared by the [place] FDA and  has been authorized for detection and/or diagnosis of SARS-CoV-2 by FDA under an Emergency Use Authorization (EUA). This EUA will remain  in effect (meaning this test can be used) for the duration of the COVID-19 declaration under Section 56 4(b)(1) of the Act, 21 U.S.C. section 360bbb-3(b)(1), unless the authorization is terminated or revoked sooner. Performed at [facility], [address], [place], [place] [address]     Labs: CBC: Recent Labs  Lab 05/23/22 1126 05/24/22 0742 05/24/22 0826 05/25/22 0219 05/25/22 1503 05/26/22 0531  WBC 10.7* 8.7 8.0 8.8  --  9.2  NEUTROABS  --   --  6.8 7.3  --  7.6  HGB 10.0* 10.5* 9.7* 11.3* 10.9* 10.8*  HCT 31.6* 32.4* 31.2* 35.7* 32.0* 33.8*  MCV 81.7 80.4 81.5 81.5  --  80.7  PLT 156 139* 134* 145*  --  163   Basic Metabolic Panel: Recent Labs  Lab 05/23/22 1126 05/23/22 1830 05/24/22 0742  05/24/22 0826 05/25/22 0219 05/25/22 1503 05/26/22 0531  NA 133*  --  130* 131* 132* 140 131*  K 3.4*  --  3.4* 3.4* 4.5 3.7 4.0  CL 102  --  102 101 103  --  102  CO2 21*  --  21* 20* 20*  --  19*  GLUCOSE 128*  --  166* 156* 104*  --  117*  BUN 11  --  10 10 11   --  13  CREATININE 1.22  --  0.95 0.92 0.92  --  1.01  CALCIUM 8.0*  --  8.0* 8.0* 8.4*  --  8.1*  MG  --  1.9  --  1.9 2.6*  --  2.2  PHOS  --   --   --  2.4* 3.3  --  3.4   Liver Function Tests: Recent Labs  Lab 05/24/22 0742 05/24/22 0826 05/25/22 0219 05/26/22 0531  AST 26 27 31  33  ALT 19 17 18 20   ALKPHOS 84 76 93 99  BILITOT 1.6* 1.3* 1.6* 1.4*  PROT 6.0* 6.0* 6.8 6.4*  ALBUMIN 2.5* 2.3* 2.6* 2.5*   CBG: No results for input(s): "GLUCAP" in the last 168 hours.  Discharge time spent: 38 minutes.   Signed: Kathlen Mody, MD [facility] 05/26/2022

## 2022-05-27 ENCOUNTER — Telehealth: Payer: Self-pay

## 2022-05-27 NOTE — Transitions of Care (Post Inpatient/ED Visit) (Signed)
   05/27/2022  Name: [name] MRN: [id number] DOB: Jan 07, 1951  Today's TOC FU Call Status: Today's TOC FU Call Status:: Successful TOC FU Call Competed TOC FU Call Complete Date: 05/27/22  Transition Care Management Follow-up Telephone Call Date of Discharge: 05/26/22 Discharge Facility: [facility]) Type of Discharge: Inpatient Admission Primary Inpatient Discharge Diagnosis:: "dyspnea on exertion,NSTEMI" How have you been since you were released from the hospital?: Better (Pt states he rested well last night-has been up walking around the home. Denies any cardac issues. Appetite fair. LBM this am.) Any questions or concerns?: No  Items Reviewed: Did you receive and understand the discharge instructions provided?: Yes Medications obtained and verified?: Yes (Medications Reviewed) Any new allergies since your discharge?: No Dietary orders reviewed?: Yes Type of Diet Ordered:: low salt/heart healthy Do you have support at home?: Yes People in Home: spouse Name of Support/Comfort Primary Source: [facility] and Equipment/Supplies: Were Home Health Services Ordered?: Yes Name of Home Health Agency:: [facility] Has Agency set up a time to come to your home?: No (pt aware that agency should contact them within 48hrs post discharge) Any new equipment or medical supplies ordered?: NA  Functional Questionnaire: Do you need assistance with bathing/showering or dressing?: Yes Do you need assistance with meal preparation?: Yes Do you need assistance with eating?: No Do you have difficulty maintaining continence: No Do you need assistance with getting out of bed/getting out of a chair/moving?: Yes Do you have difficulty managing or taking your medications?: No  Follow up appointments reviewed: PCP Follow-up appointment confirmed?: NA Specialist Hospital Follow-up appointment confirmed?: Yes Date of Specialist follow-up appointment?: 06/03/22 Follow-Up Specialty Provider:: Dr.  Gasper Lloyd Do you need transportation to your follow-up appointment?: No Do you understand care options if your condition(s) worsen?: Yes-patient verbalized understanding  SDOH Interventions Today    Flowsheet Row Most Recent Value  SDOH Interventions   Food Insecurity Interventions Intervention Not Indicated  Transportation Interventions Intervention Not Indicated      TOC Interventions Today    Flowsheet Row Most Recent Value  TOC Interventions   TOC Interventions Discussed/Reviewed TOC Interventions Discussed      Interventions Today    Flowsheet Row Most Recent Value  General Interventions   General Interventions Discussed/Reviewed General Interventions Discussed, Doctor Visits  Doctor Visits Discussed/Reviewed Doctor Visits Discussed, Specialist, PCP  PCP/Specialist Visits Compliance with follow-up visit  Education Interventions   Education Provided Provided Education  Provided Verbal Education On Nutrition, Medication, When to see the doctor, Other  Nutrition Interventions   Nutrition Discussed/Reviewed Nutrition Discussed, Adding fruits and vegetables, Decreasing salt  Pharmacy Interventions   Pharmacy Dicussed/Reviewed Pharmacy Topics Discussed, Medications and their functions  Safety Interventions   Safety Discussed/Reviewed Safety Discussed, Home Safety       Alessandra Grout [facility] Care Management Care Management Community Coordinator Direct Phone: [phone] Toll Free: [phone] Fax: [phone]

## 2022-05-28 ENCOUNTER — Telehealth: Payer: Self-pay | Admitting: Cardiovascular Disease

## 2022-05-28 NOTE — Telephone Encounter (Signed)
Patient stated he was recently switched to Eliquis in the hospital and wants to know if he still needs to do his Coumadin check visit on 4/24.

## 2022-05-28 NOTE — Telephone Encounter (Signed)
Spoke to the patient, explained Dr. Clifton James recommendations:  No need to keep f/u in coumadin clinic. [name]    Patient voiced understanding.

## 2022-05-28 NOTE — Telephone Encounter (Signed)
Pt stated while he was admitted to the hospital on 4/14, he was prescribed Eliquis. Patient is scheduled on 4/24 with the coumadin clinic. He will like to know does should he keep the scheduled appointment. Will forward to MD and nurse for advise.

## 2022-06-02 ENCOUNTER — Ambulatory Visit: Payer: Medicare HMO

## 2022-06-03 ENCOUNTER — Other Ambulatory Visit (HOSPITAL_COMMUNITY): Payer: Self-pay

## 2022-06-03 ENCOUNTER — Encounter (HOSPITAL_COMMUNITY): Payer: Self-pay | Admitting: Cardiology

## 2022-06-03 ENCOUNTER — Ambulatory Visit (HOSPITAL_COMMUNITY)
Admit: 2022-06-03 | Discharge: 2022-06-03 | Disposition: A | Discharge status: Home / Self Care | Payer: Medicare HMO | Attending: Cardiology | Admitting: Cardiology

## 2022-06-03 VITALS — BP 114/72 | HR 60 | Wt 212.0 lb

## 2022-06-03 DIAGNOSIS — Z8774 Personal history of (corrected) congenital malformations of heart and circulatory system: Secondary | ICD-10-CM | POA: Insufficient documentation

## 2022-06-03 DIAGNOSIS — Z950 Presence of cardiac pacemaker: Secondary | ICD-10-CM | POA: Insufficient documentation

## 2022-06-03 DIAGNOSIS — E785 Hyperlipidemia, unspecified: Secondary | ICD-10-CM | POA: Diagnosis not present

## 2022-06-03 DIAGNOSIS — Z7901 Long term (current) use of anticoagulants: Secondary | ICD-10-CM | POA: Diagnosis not present

## 2022-06-03 DIAGNOSIS — I4892 Unspecified atrial flutter: Secondary | ICD-10-CM

## 2022-06-03 DIAGNOSIS — I082 Rheumatic disorders of both aortic and tricuspid valves: Secondary | ICD-10-CM | POA: Insufficient documentation

## 2022-06-03 DIAGNOSIS — Z951 Presence of aortocoronary bypass graft: Secondary | ICD-10-CM

## 2022-06-03 DIAGNOSIS — I509 Heart failure, unspecified: Secondary | ICD-10-CM | POA: Diagnosis not present

## 2022-06-03 DIAGNOSIS — Z79899 Other long term (current) drug therapy: Secondary | ICD-10-CM | POA: Insufficient documentation

## 2022-06-03 DIAGNOSIS — Z953 Presence of xenogenic heart valve: Secondary | ICD-10-CM | POA: Diagnosis not present

## 2022-06-03 DIAGNOSIS — I442 Atrioventricular block, complete: Secondary | ICD-10-CM | POA: Diagnosis not present

## 2022-06-03 DIAGNOSIS — I5032 Chronic diastolic (congestive) heart failure: Secondary | ICD-10-CM

## 2022-06-03 DIAGNOSIS — I2510 Atherosclerotic heart disease of native coronary artery without angina pectoris: Secondary | ICD-10-CM | POA: Diagnosis not present

## 2022-06-03 MED ORDER — EMPAGLIFLOZIN 10 MG PO TABS: 10.0000 mg | ORAL_TABLET | Freq: Every day | ORAL | 11 refills | Status: DC

## 2022-06-03 MED ORDER — DIGOXIN 125 MCG PO TABS: 0.1250 mg | ORAL_TABLET | Freq: Every day | ORAL | 3 refills | Status: DC

## 2022-06-03 NOTE — Progress Notes (Addendum)
ADVANCED HEART FAILURE CLINIC NOTE  Referring Physician: [name], MD  Primary Care: [name], MD Primary Cardiologist:  HPI: [name] is a 72 y.o. male with coronary artery disease status post CABG x 2 with left atrial appendage clipping and PFO closure, severe aortic stenosis and mitral stenosis status post bioprosthetic AVR and MVR, hyperlipidemia, complete heart block status post permanent pacemaker, history of atrial flutter currently presenting to establish care. Based on chart review prior to his bioprosthetic AVR and MVR [name] had preserved LV and RV function.  Shortly after the procedure he had a pacemaker placed for complete heart block.  Since that time echocardiograms demonstrate enlarging right ventricle with progressively worsening tricuspid regurgitation.  [name] now reports feeling very limited due to shortness of breath, fatigue and arthritis.  Interval hx:  [name] reports feeling very poor since his discharge from the hospital. He reports being able to go to the grocery store 3 to 4 weeks ago without assistance and being able to walk in and out with only mild to moderate dyspnea.  He now states he is unable to walk from room to room in his house without significant limitations due to shortness of breath and lower extremity pain secondary to arthritis.  Activity level/exercise tolerance: NYHA III Orthopnea:  Sleeps on 2 pillows Paroxysmal noctural dyspnea:  no Chest pain/pressure:  no Orthostatic lightheadedness:  no Palpitations:  no Lower extremity edema:  yes but resolved Presyncope/syncope:  no Cough:  no  Past Medical History:  Diagnosis Date   BCE (basal cell epithelioma), arm    RIGHT SHOULDER   Bradycardia    Bumps on skin    on left side of nose for last 3 weeks   CAD (coronary artery disease)    a. s/p CABGx2 10/11/18 LIMA to LAD, SVG to RCA, EVH via right thigh.   Dyslipidemia    Ectatic thoracic aorta    Esophageal stricture     GERD (gastroesophageal reflux disease)    Heart murmur    sees dr Rose Fillers   Hyperlipidemia    Incidental pulmonary nodule, > 3mm and < 8mm 10/04/2018   Noted on CTA   Inguinal hernia    bilateral   Obesity    Presence of permanent cardiac pacemaker    Prostate cancer    S/P aortic valve replacement with bioprosthetic valve 10/11/2018   23 mm Edwards Inspiris Resilia stented bovine pericardial tissue valve, miltral valve done also   S/P CABG x 2 10/11/2018   LIMA to LAD, SVG to RCA, EVH via right thigh   S/P mitral valve replacement with bioprosthetic valve 10/11/2018   27 mm Medtronic Mosaic stented porcine bioprosthetic tissue valve   S/P patent foramen ovale closure 10/11/2018   S/P placement of cardiac pacemaker    Smoker     Current Outpatient Medications  Medication Sig Dispense Refill   acetaminophen (TYLENOL) 325 MG tablet Take 650 mg by mouth as needed for moderate pain.     apixaban (ELIQUIS) 5 MG TABS tablet Take 1 tablet (5 mg total) by mouth 2 (two) times daily. 60 tablet 1   atorvastatin (LIPITOR) 20 MG tablet Take 1 tablet (20 mg total) by mouth daily. 90 tablet 3   cyanocobalamin (VITAMIN B12) 500 MCG tablet Take 1 tablet (500 mcg total) by mouth daily. 30 tablet 3   furosemide (LASIX) 20 MG tablet Take 1 tablet (20 mg total) by mouth 2 (two) times daily. 60 tablet  1   Iron, Ferrous Sulfate, 325 (65 Fe) MG TABS Take 1 tablet by mouth 2 (two) times daily. 60 tablet 1   metoprolol tartrate (LOPRESSOR) 25 MG tablet TAKE 1 TABLET BY MOUTH 2 TIMES A DAY (Patient taking differently: Take 25 mg by mouth 2 (two) times daily.) 180 tablet 3   oxymetazoline (AFRIN) 0.05 % nasal spray Place 1 spray into both nostrils at bedtime as needed for congestion.     polyethylene glycol powder (GLYCOLAX/MIRALAX) 17 GM/SCOOP powder Take 17 g by mouth daily as needed for mild constipation. 238 g 0   No current facility-administered medications for this visit.    No Known Allergies     Social History   Socioeconomic History   Marital status: Married    Spouse name: Not on file   Number of children: 0   Years of education: Not on file   Highest education level: Not on file  Occupational History   Occupation: Photographer    Employer: [employer]  Tobacco Use   Smoking status: Former    Packs/day: 0.25    Years: 35.00    Additional pack years: 0.00    Total pack years: 8.75    Types: Cigarettes    Quit date: 09/04/2018    Years since quitting: 3.7   Smokeless tobacco: Never  Vaping Use   Vaping Use: Never used  Substance and Sexual Activity   Alcohol use: Yes    Alcohol/week: 1.0 standard drink of alcohol    Types: 1 Cans of beer per week    Comment: rare   Drug use: No   Sexual activity: Yes  Other Topics Concern   Not on file  Social History Narrative   Not on file   Social Determinants of Health   Financial Resource Strain: Low Risk  (05/11/2022)   Overall Financial Resource Strain (CARDIA)    Difficulty of Paying Living Expenses: Not hard at all  Food Insecurity: No Food Insecurity (05/27/2022)   Hunger Vital Sign    Worried About Running Out of Food in the Last Year: Never true    Ran Out of Food in the Last Year: Never true  Transportation Needs: No Transportation Needs (05/27/2022)   PRAPARE - Administrator, Civil Service (Medical): No    Lack of Transportation (Non-Medical): No  Physical Activity: Inactive (05/11/2022)   Exercise Vital Sign    Days of Exercise per Week: 0 days    Minutes of Exercise per Session: 0 min  Stress: No Stress Concern Present (05/11/2022)   [employer] of Occupational Health - Occupational Stress Questionnaire    Feeling of Stress : Not at all  Social Connections: Not on file  Intimate Partner Violence: Not At Risk (05/23/2022)   Humiliation, Afraid, Rape, and Kick questionnaire    Fear of Current or Ex-Partner: No    Emotionally Abused: No    Physically Abused: No    Sexually Abused: No       Family History  Problem Relation Age of Onset   Colon cancer Mother    Lung cancer Father        smoker   CAD Neg Hx    Pancreatic cancer Neg Hx    Breast cancer Neg Hx     PHYSICAL EXAM: There were no vitals filed for this visit. GENERAL: Well nourished, well developed, and in no apparent distress at rest.  HEENT: Negative for arcus senilis or xanthelasma. There is no scleral icterus.  The  mucous membranes are pink and moist.   NECK: Supple, No masses. Normal carotid upstrokes without bruits. No masses or thyromegaly.    CHEST: There are no chest wall deformities. There is no chest wall tenderness. Respirations are unlabored.  Lungs- cta b/l CARDIAC:  JVP: 7 cm H2O         Normal S1, S2  Normal rate with regular rhythm. No murmurs, rubs or gallops.  Pulses are 2+ and symmetrical in upper and lower extremities. no edema.  ABDOMEN: Soft, non-tender, non-distended. There are no masses or hepatomegaly. There are normal bowel sounds.  EXTREMITIES: Warm and well perfused with no cyanosis, clubbing.  LYMPHATIC: No axillary or supraclavicular lymphadenopathy.  NEUROLOGIC: Patient is oriented x3 with no focal or lateralizing neurologic deficits.  PSYCH: Patients affect is appropriate, there is no evidence of anxiety or depression.  SKIN: Warm and dry; no lesions or wounds.   DATA REVIEW  ECG: 06/03/22: NSR  as per my personal interpretation  ECHO: 05/23/22: Normal LV function, severely dilated RV with moderately reduced function, moderate stenosis of prosthesis (gradient ), severe TR due to poor coaptation of leaflets, s/p AVR as per my personal interpretation 06/13/19: Normal LV function with septal bowing to the left; dilated RV with mildly reduced function. Mitral bioprosthesis gradient of 06/27/18: Normal LV function, normal RV function. Severe MS/AS with moderate AI.    CATH: 05/25/22:   Prox LAD lesion is 40% stenosed.   Mid LAD lesion is 95% stenosed.   Prox Cx to Mid  Cx lesion is 30% stenosed.   1st Mrg lesion is 30% stenosed.   3rd Mrg lesion is 30% stenosed.   Prox RCA lesion is 100% stenosed.   SVG graft was visualized by angiography and is normal in caliber.   LIMA graft was visualized by angiography and is normal in caliber.   Severe mid LAD stenosis. Competitive filling is seen distally from the patent LIMA graft.  Patent LIMA graft to the mid LAD.  Mild non-obstructive disease in the Circumflex artery.  Large dominant RCA with chronic proximal occlusion. The mid and distal RCA fills from the patent vein graft.  Near normal right and left heart pressures.  CO 6.92 L/min CI 3.33  09/15/2018: Prox RCA lesion is 100% stenosed. Prox Cx to Mid Cx lesion is 30% stenosed. 1st Mrg lesion is 30% stenosed. 3rd Mrg lesion is 30% stenosed. Prox LAD lesion is 40% stenosed. Mid LAD lesion is 95% stenosed.   1. Severe stenosis mid LAD 2. Mild non-obstructive disease in the LAD 3. Chronic occlusion of the proximal RCA. Filling of the proximal, mid and distal RCA from left to right collaterals 4. Severe aortic stenosis (mean gradient 34.1 mmHg, peak to peak gradient 32 mmHg, AVA 1.1 cm2) 5. Severe mitral stenosis by echo.     ASSESSMENT & PLAN:   1. RV dysfunction - RHC hemodynamics from 05/25/22 personally interpretted:  RA 10 (large V waves), RV 28/6-10, PA 25/15, PCWP 14,  -RV dysfunction is now likely multifactorial: he is chronically RV paced with device interrogation from 05/15/22 demonstrating 96% RV pacing, he has a dilated RV leading to poor coaptation of the tricuspid valve and as a result severe TR and in addition has likely moderate to severe stenosis of his bioprosthetic mitral valve. From a hemodynamic standpoint though the MS does not appear to be having a significant effect on his PA pressures or PCWP.  - start digoxin , start jardiance  - Will discuss case  with EP & structural cardiology. Prior to MVR/AVR, his LV function and RV  function were intact. Since that time, serial echocardiograms demonstrate worsening TR and RV dilation. Although he has moderate to severe bioprosthetic MS it has not impacted his PA pressures or PCWP enough to compromise RV function to this point. RV dilation may be secondary to chronic RV pacing leading to TR due to poor valve coaptation.   2. Bioprosthetic mitral valve stenosis - Mean gradient of 11-78mmhg by TTE & invasive hemodynamics - Will plan for TEE to evaluate   3. Severe TR - Appears functional and secondary to RV dilation leading to poor coaptation. See above.   4. CAD s/p CABG x 2, closure of PFO - AVR, MVR and CABG x 2 (LIMA-LAD, SVG to RCA) in 10/11/2018 - metoprolol  daily  5. pAFL - continue metoprolol - EKG today - apixaban  BID   Colin Ellers Advanced Heart Failure Mechanical Circulatory Support

## 2022-06-03 NOTE — Patient Instructions (Addendum)
Medication Changes:  START Digoxin 0.125 mg ( 1 tablet ) daily   START Jardiance 10 mg ( 1 tablet ) daily    Testing/Procedures:  You are scheduled for a TEE (Transesophageal Echocardiogram) on Tuesday, May 28 with Dr. Gasper Lloyd.  Please arrive at the [facility] (Main Entrance A) at [facility]: [address] [place], [place] [address] at 9:00 AM.   DIET:  Nothing to eat or drink after midnight except a sip of water with medications (see medication instructions below)  MEDICATION INSTRUCTIONS: !!IF ANY NEW MEDICATIONS ARE STARTED AFTER TODAY, PLEASE NOTIFY YOUR PROVIDER AS SOON AS POSSIBLE!!  FYI: Medications such as Semaglutide (Ozempic, [place]), Tirzepatide (Mounjaro, Zepbound), Dulaglutide (Trulicity), etc ("GLP1 agonists") must be held around the time of a procedure. Talk to your provider if you take one of these.  HOLD JARDIANCE AND LASIX THE MORNING OF THE PROCEDURE.       Continue taking your anticoagulant (blood thinner): Apixaban (Eliquis).  You will need to continue this after your procedure until you are told by your provider that it is safe to stop.    FYI:  For your safety, and to allow [place] to monitor your vital signs accurately during the surgery/procedure we request: If you have artificial nails, gel coating, SNS etc, please have those removed prior to your surgery/procedure. Not having the nail coverings /polish removed may result in cancellation or delay of your surgery/procedure.  You must have a responsible person to drive you home and stay in the waiting area during your procedure. Failure to do so could result in cancellation.  Bring your insurance cards.  *Special Note: Every effort is made to have your procedure done on time. Occasionally there are emergencies that occur at the hospital that may cause delays. Please be patient if a delay does occur.      Follow-Up in:   Your physician recommends that you schedule a follow-up appointment in: 6 weeks     Do the following things EVERYDAY: Weigh yourself in the morning before breakfast. Write it down and keep it in a log. Take your medicines as prescribed Eat low salt foods--Limit salt (sodium) to 2000 mg per day.  Stay as active as you can everyday Limit all fluids for the day to less than 2 liters    Need to Contact us:  If you have any questions or concerns before your next appointment please send [place] a message through [place] or call our office at [phone].    TO LEAVE A MESSAGE FOR THE NURSE SELECT OPTION 2, PLEASE LEAVE A MESSAGE INCLUDING: YOUR NAME DATE OF BIRTH CALL BACK NUMBER REASON FOR CALL**this is important as we prioritize the call backs  YOU WILL RECEIVE A CALL BACK THE SAME DAY AS LONG AS YOU CALL BEFORE 4:00 PM   At the [facility], you and your health needs are our priority. As part of our continuing mission to provide you with exceptional heart care, we have created designated Provider Care Teams. These Care Teams include your primary Cardiologist (physician) and Advanced Practice Providers (APPs- Physician Assistants and Nurse Practitioners) who all work together to provide you with the care you need, when you need it.   You may see any of the following providers on your designated Care Team at your next follow up: Dr Arvilla Meres Dr Marca Ancona Dr. Marcos Eke, NP Robbie Lis, PA [facility] [place], [place] Brynda Peon, NP Karle Plumber, PharmD  Please be sure to bring in all your medications bottles to every appointment.    Thank you for choosing [facility]

## 2022-06-07 ENCOUNTER — Telehealth (HOSPITAL_COMMUNITY): Payer: Self-pay

## 2022-06-07 NOTE — Telephone Encounter (Signed)
Patients wife called to report that the patient has been falling over the past weekend and seems to be getting weaker to the point where they have to assist him to the restroom. She wants to know if the TEE can be moved up. Please advise.

## 2022-06-08 ENCOUNTER — Telehealth: Payer: Self-pay | Admitting: *Deleted

## 2022-06-08 NOTE — Telephone Encounter (Signed)
Pt wife called again to f/u w/previous message, please advise

## 2022-06-09 ENCOUNTER — Ambulatory Visit (HOSPITAL_COMMUNITY)
Admission: RE | Admit: 2022-06-09 | Discharge: 2022-06-09 | Disposition: A | Discharge status: Home / Self Care | Payer: Medicare HMO | Source: Ambulatory Visit | Attending: Family Medicine | Admitting: Family Medicine

## 2022-06-09 ENCOUNTER — Encounter (HOSPITAL_COMMUNITY): Payer: Self-pay

## 2022-06-09 VITALS — BP 110/80 | HR 105 | Wt 209.0 lb

## 2022-06-09 DIAGNOSIS — Z87891 Personal history of nicotine dependence: Secondary | ICD-10-CM | POA: Diagnosis not present

## 2022-06-09 DIAGNOSIS — Z953 Presence of xenogenic heart valve: Secondary | ICD-10-CM | POA: Diagnosis not present

## 2022-06-09 DIAGNOSIS — Z7901 Long term (current) use of anticoagulants: Secondary | ICD-10-CM | POA: Insufficient documentation

## 2022-06-09 DIAGNOSIS — I4892 Unspecified atrial flutter: Secondary | ICD-10-CM | POA: Diagnosis not present

## 2022-06-09 DIAGNOSIS — Z79899 Other long term (current) drug therapy: Secondary | ICD-10-CM | POA: Insufficient documentation

## 2022-06-09 DIAGNOSIS — I2510 Atherosclerotic heart disease of native coronary artery without angina pectoris: Secondary | ICD-10-CM | POA: Diagnosis not present

## 2022-06-09 DIAGNOSIS — Z993 Dependence on wheelchair: Secondary | ICD-10-CM | POA: Insufficient documentation

## 2022-06-09 DIAGNOSIS — D649 Anemia, unspecified: Secondary | ICD-10-CM | POA: Diagnosis not present

## 2022-06-09 DIAGNOSIS — I5032 Chronic diastolic (congestive) heart failure: Secondary | ICD-10-CM

## 2022-06-09 DIAGNOSIS — I071 Rheumatic tricuspid insufficiency: Secondary | ICD-10-CM

## 2022-06-09 DIAGNOSIS — R531 Weakness: Secondary | ICD-10-CM | POA: Insufficient documentation

## 2022-06-09 DIAGNOSIS — I480 Paroxysmal atrial fibrillation: Secondary | ICD-10-CM | POA: Insufficient documentation

## 2022-06-09 DIAGNOSIS — Z951 Presence of aortocoronary bypass graft: Secondary | ICD-10-CM | POA: Diagnosis not present

## 2022-06-09 DIAGNOSIS — I082 Rheumatic disorders of both aortic and tricuspid valves: Secondary | ICD-10-CM | POA: Insufficient documentation

## 2022-06-09 DIAGNOSIS — Z950 Presence of cardiac pacemaker: Secondary | ICD-10-CM | POA: Diagnosis not present

## 2022-06-09 DIAGNOSIS — Z8774 Personal history of (corrected) congenital malformations of heart and circulatory system: Secondary | ICD-10-CM | POA: Insufficient documentation

## 2022-06-09 LAB — COMPREHENSIVE METABOLIC PANEL
ALT: 15 U/L (ref 0–44)
AST: 25 U/L (ref 15–41)
Albumin: 2.8 g/dL — ABNORMAL LOW (ref 3.5–5.0)
Alkaline Phosphatase: 81 U/L (ref 38–126)
Anion gap: 10 (ref 5–15)
BUN: 14 mg/dL (ref 8–23)
CO2: 21 mmol/L — ABNORMAL LOW (ref 22–32)
Calcium: 8.5 mg/dL — ABNORMAL LOW (ref 8.9–10.3)
Chloride: 99 mmol/L (ref 98–111)
Creatinine, Ser: 0.98 mg/dL (ref 0.61–1.24)
GFR, Estimated: >60 mL/min (ref >60)
Glucose, Bld: 119 mg/dL — ABNORMAL HIGH (ref 70–99)
Potassium: 3.5 mmol/L (ref 3.5–5.1)
Sodium: 130 mmol/L — ABNORMAL LOW (ref 135–145)
Total Bilirubin: 2.1 mg/dL — ABNORMAL HIGH (ref 0.3–1.2)
Total Protein: 7.3 g/dL (ref 6.5–8.1)

## 2022-06-09 LAB — CBC
HCT: 37.1 % — ABNORMAL LOW (ref 39.0–52.0)
Hemoglobin: 12.2 g/dL — ABNORMAL LOW (ref 13.0–17.0)
MCH: 25.6 pg — ABNORMAL LOW (ref 26.0–34.0)
MCHC: 32.9 g/dL (ref 30.0–36.0)
MCV: 77.8 fL — ABNORMAL LOW (ref 80.0–100.0)
Platelets: 87 10*3/uL — ABNORMAL LOW (ref 150–400)
RBC: 4.77 MIL/uL (ref 4.22–5.81)
RDW: 17.8 % — ABNORMAL HIGH (ref 11.5–15.5)
WBC: 14.7 10*3/uL — ABNORMAL HIGH (ref 4.0–10.5)
nRBC: 0 % (ref 0.0–0.2)

## 2022-06-09 LAB — BRAIN NATRIURETIC PEPTIDE: B Natriuretic Peptide: 442.7 pg/mL — ABNORMAL HIGH (ref 0.0–100.0)

## 2022-06-09 LAB — MAGNESIUM: Magnesium: 2.2 mg/dL (ref 1.7–2.4)

## 2022-06-09 MED ORDER — METOPROLOL TARTRATE 50 MG PO TABS: 75.0000 mg | ORAL_TABLET | Freq: Two times a day (BID) | ORAL | 3 refills | Status: DC

## 2022-06-09 MED ORDER — METOPROLOL TARTRATE 25 MG PO TABS: 37.5000 mg | ORAL_TABLET | Freq: Two times a day (BID) | ORAL | 3 refills | Status: DC

## 2022-06-09 NOTE — Patient Instructions (Addendum)
Thank you for coming in today  Labs were done today, if any labs are abnormal the clinic will call you No news is good news  Please hold Digoxin in the morning until after digoxin trough level has been drawn   Medications: Increase Metoprolol to 37.5 mg (1 1/2 tablets) twice daily   Follow up appointments:  Your physician recommends that you schedule a follow-up appointment in:  Keep appointment as scheduled with Dr. Virgina Jock    Do the following things EVERYDAY: Weigh yourself in the morning before breakfast. Write it down and keep it in a log. Take your medicines as prescribed Eat low salt foods--Limit salt (sodium) to 2000 mg per day.  Stay as active as you can everyday Limit all fluids for the day to less than 2 liters   At the [facility], you and your health needs are our priority. As part of our continuing mission to provide you with exceptional heart care, we have created designated Provider Care Teams. These Care Teams include your primary Cardiologist (physician) and Advanced Practice Providers (APPs- Physician Assistants and Nurse Practitioners) who all work together to provide you with the care you need, when you need it.   You may see any of the following providers on your designated Care Team at your next follow up: Dr Arvilla Meres Dr Marca Ancona Dr. Marcos Eke, NP Robbie Lis, [place] [facility] [place], [place] Brynda Peon, NP Karle Plumber, PharmD   Please be sure to bring in all your medications bottles to every appointment.    Thank you for choosing [facility]  If you have any questions or concerns before your next appointment please send [place] a message through [place] or call our office at [phone].    TO LEAVE A MESSAGE FOR THE NURSE SELECT OPTION 2, PLEASE LEAVE A MESSAGE INCLUDING: YOUR NAME DATE OF BIRTH CALL BACK NUMBER REASON FOR CALL**this is important  as we prioritize the call backs  YOU WILL RECEIVE A CALL BACK THE SAME DAY AS LONG AS YOU CALL BEFORE 4:00 PM

## 2022-06-09 NOTE — Telephone Encounter (Signed)
Just called and scheduled him for today at 3pm

## 2022-06-09 NOTE — Telephone Encounter (Signed)
I spoke to patient and updated him TEE schedule.

## 2022-06-09 NOTE — Telephone Encounter (Signed)
See below

## 2022-06-09 NOTE — Progress Notes (Signed)
ReDS Vest / Clip - 06/09/22 1509       ReDS Vest / Clip   Station Marker D    Ruler Value 36    ReDS Value Range Moderate volume overload    ReDS Actual Value 36

## 2022-06-09 NOTE — Progress Notes (Signed)
ADVANCED HEART FAILURE CLINIC NOTE  Primary Care: [name], MD HF Cardiologist: Dr. Gasper Lloyd  HPI: [name] is a 72 y.o. male with coronary artery disease status post CABG x 2 with left atrial appendage clipping and PFO closure, severe aortic stenosis and mitral stenosis status post bioprosthetic AVR and MVR, hyperlipidemia, complete heart block status post permanent pacemaker, history of atrial flutter currently presenting to establish care. Based on chart review prior to his bioprosthetic AVR and MVR [name] had preserved LV and RV function.  Shortly after the procedure he had a pacemaker placed for complete heart block.  Since that time echocardiograms demonstrate enlarging right ventricle with progressively worsening tricuspid regurgitation.  [name] now reports feeling very limited due to shortness of breath, fatigue and arthritis.  Interval hx:  First saw Dr. Gasper Lloyd 06/03/22, feeling very poorly with NYHA III symptoms. Jardiance 10 mg and digozin 0.125 mg started. Planned to undergo TEE to evaluate mitral valve.  Today he returns for an acute visit with his wife with complaints of increased weakness and falls at home. This started last week. He got up to go to the restroom and legs gave out. Had an episode of confusion last week. He has dyspnea with ADLs chronically. He remains mostly WC bound. Denies GU symptoms, CP, dizziness, edema, or PND/Orthopnea. Appetite ok. No fever or chills. Weight at home 209 pounds. Taking all medications.   Activity level/exercise tolerance: NYHA IIIb Orthopnea:  Sleeps on 2 pillows Paroxysmal noctural dyspnea:  no Chest pain/pressure:  no Orthostatic lightheadedness:  no Palpitations:  no Lower extremity edema: no Presyncope/syncope:  no Cough:  no  Past Medical History:  Diagnosis Date   BCE (basal cell epithelioma), arm    RIGHT SHOULDER   Bradycardia    Bumps on skin    on left side of nose for last 3 weeks   CAD (coronary  artery disease)    a. s/p CABGx2 10/11/18 LIMA to LAD, SVG to RCA, EVH via right thigh.   Dyslipidemia    Ectatic thoracic aorta (HCC)    Esophageal stricture    GERD (gastroesophageal reflux disease)    Heart murmur    sees dr Rose Fillers   Hyperlipidemia    Incidental pulmonary nodule, > 3mm and < 8mm 10/04/2018   Noted on CTA   Inguinal hernia    bilateral   Obesity    Presence of permanent cardiac pacemaker    Prostate cancer (HCC)    S/P aortic valve replacement with bioprosthetic valve 10/11/2018   23 mm Edwards Inspiris Resilia stented bovine pericardial tissue valve, miltral valve done also   S/P CABG x 2 10/11/2018   LIMA to LAD, SVG to RCA, EVH via right thigh   S/P mitral valve replacement with bioprosthetic valve 10/11/2018   27 mm Medtronic Mosaic stented porcine bioprosthetic tissue valve   S/P patent foramen ovale closure 10/11/2018   S/P placement of cardiac pacemaker    Smoker    Current Outpatient Medications  Medication Sig Dispense Refill   acetaminophen (TYLENOL) 325 MG tablet Take 650 mg by mouth as needed for moderate pain.     apixaban (ELIQUIS) 5 MG TABS tablet Take 1 tablet (5 mg total) by mouth 2 (two) times daily. 60 tablet 1   atorvastatin (LIPITOR) 20 MG tablet Take 1 tablet (20 mg total) by mouth daily. 90 tablet 3   digoxin (LANOXIN) 0.125 MG tablet Take 1 tablet (0.125 mg total) by mouth daily. 90  tablet 3   empagliflozin (JARDIANCE) 10 MG TABS tablet Take 1 tablet (10 mg total) by mouth daily before breakfast. 30 tablet 11   furosemide (LASIX) 20 MG tablet Take 1 tablet (20 mg total) by mouth 2 (two) times daily. (Patient taking differently: Take 20 mg by mouth daily.) 60 tablet 1   Iron, Ferrous Sulfate, 325 (65 Fe) MG TABS Take 1 tablet by mouth 2 (two) times daily. (Patient taking differently: Take 1 tablet by mouth daily.) 60 tablet 1   metoprolol tartrate (LOPRESSOR) 25 MG tablet TAKE 1 TABLET BY MOUTH 2 TIMES A DAY (Patient taking differently: Take 25  mg by mouth 2 (two) times daily.) 180 tablet 3   oxymetazoline (AFRIN) 0.05 % nasal spray Place 1 spray into both nostrils at bedtime as needed for congestion.     polyethylene glycol powder (GLYCOLAX/MIRALAX) 17 GM/SCOOP powder Take 17 g by mouth daily as needed for mild constipation. 238 g 0   No current facility-administered medications for this encounter.   No Known Allergies  Social History   Socioeconomic History   Marital status: Married    Spouse name: Not on file   Number of children: 0   Years of education: Not on file   Highest education level: Not on file  Occupational History   Occupation: Photographer    Employer: [employer]  Tobacco Use   Smoking status: Former    Packs/day: 0.25    Years: 35.00    Additional pack years: 0.00    Total pack years: 8.75    Types: Cigarettes    Quit date: 09/04/2018    Years since quitting: 3.7   Smokeless tobacco: Never  Vaping Use   Vaping Use: Never used  Substance and Sexual Activity   Alcohol use: Yes    Alcohol/week: 1.0 standard drink of alcohol    Types: 1 Cans of beer per week    Comment: rare   Drug use: No   Sexual activity: Yes  Other Topics Concern   Not on file  Social History Narrative   Not on file   Social Determinants of Health   Financial Resource Strain: Low Risk  (05/11/2022)   Overall Financial Resource Strain (CARDIA)    Difficulty of Paying Living Expenses: Not hard at all  Food Insecurity: No Food Insecurity (05/27/2022)   Hunger Vital Sign    Worried About Running Out of Food in the Last Year: Never true    Ran Out of Food in the Last Year: Never true  Transportation Needs: No Transportation Needs (05/27/2022)   PRAPARE - Administrator, Civil Service (Medical): No    Lack of Transportation (Non-Medical): No  Physical Activity: Inactive (05/11/2022)   Exercise Vital Sign    Days of Exercise per Week: 0 days    Minutes of Exercise per Session: 0 min  Stress: No Stress Concern Present  (05/11/2022)   [employer] of Occupational Health - Occupational Stress Questionnaire    Feeling of Stress : Not at all  Social Connections: Not on file  Intimate Partner Violence: Not At Risk (05/23/2022)   Humiliation, Afraid, Rape, and Kick questionnaire    Fear of Current or Ex-Partner: No    Emotionally Abused: No    Physically Abused: No    Sexually Abused: No    Family History  Problem Relation Age of Onset   Colon cancer Mother    Lung cancer Father        smoker  CAD Neg Hx    Pancreatic cancer Neg Hx    Breast cancer Neg Hx    DATA REVIEW PPM interrogation (personally reviewed today): 1.1 hr/day activity, minimal AF/AT, 92.8% v pacing  ECG: 06/03/22: NSR  as per my personal interpretation 06/09/22: a sensed, v paced, ST with PVC, personally reviewed with Dr. Shirlee Latch  ECHO: - 05/23/22: Normal LV function, severely dilated RV with moderately reduced function, moderate stenosis of prosthesis (gradient ), severe TR due to poor coaptation of leaflets, s/p AVR as per my personal interpretation - 06/13/19: Normal LV function with septal bowing to the left; dilated RV with mildly reduced function. Mitral bioprosthesis gradient of - 06/27/18: Normal LV function, normal RV function. Severe MS/AS with moderate AI.   CATH: - 05/25/22:   Prox LAD lesion is 40% stenosed.   Mid LAD lesion is 95% stenosed.   Prox Cx to Mid Cx lesion is 30% stenosed.   1st Mrg lesion is 30% stenosed.   3rd Mrg lesion is 30% stenosed.   Prox RCA lesion is 100% stenosed.   SVG graft was visualized by angiography and is normal in caliber.   LIMA graft was visualized by angiography and is normal in caliber.   Severe mid LAD stenosis. Competitive filling is seen distally from the patent LIMA graft.  Patent LIMA graft to the mid LAD.  Mild non-obstructive disease in the Circumflex artery.  Large dominant RCA with chronic proximal occlusion. The mid and distal RCA fills from the patent vein  graft.  Near normal right and left heart pressures.  CO 6.92 L/min CI 3.33  - 09/15/2018: Prox RCA lesion is 100% stenosed. Prox Cx to Mid Cx lesion is 30% stenosed. 1st Mrg lesion is 30% stenosed. 3rd Mrg lesion is 30% stenosed. Prox LAD lesion is 40% stenosed. Mid LAD lesion is 95% stenosed.   1. Severe stenosis mid LAD 2. Mild non-obstructive disease in the LAD 3. Chronic occlusion of the proximal RCA. Filling of the proximal, mid and distal RCA from left to right collaterals 4. Severe aortic stenosis (mean gradient 34.1 mmHg, peak to peak gradient 32 mmHg, AVA 1.1 cm2) 5. Severe mitral stenosis by echo.   BP 110/80   Pulse (!) 105   Wt 94.8 kg (209 lb)   SpO2 96%   BMI 32.73 kg/m   Wt Readings from Last 3 Encounters:  06/09/22 94.8 kg (209 lb)  06/03/22 96.2 kg (212 lb)  05/26/22 97.6 kg (215 lb 1.6 oz)   PHYSICAL EXAM: General:  NAD. No resp difficulty, arrived in [facility] HEENT: Normal Neck: Supple. No JVD. Carotids 2+ bilat; no bruits. No lymphadenopathy or thryomegaly appreciated. Cor: PMI nondisplaced. Tachy regular rate & rhythm. No rubs, gallops or murmurs. Lungs: Clear Abdomen: Soft, nontender, nondistended. No hepatosplenomegaly. No bruits or masses. Good bowel sounds. Extremities: No cyanosis, clubbing, rash, edema Neuro: Alert & oriented x 3, cranial nerves grossly intact. Moves all 4 extremities w/o difficulty. Affect pleasant.  ASSESSMENT & PLAN: Weakness - Likely multifactorial. He has been WC bound for some time, physically limited by LE pain from arthritis. - ECG shows ST with PVCs - He is euvolemic today, ReDs 36% - No GU symptoms or fever, recently started Jardiance.  - Check CMET, CBC, BNP today; has digoxin trough scheduled for tomorrow, reminded to hold dig before labs. - Would benefit from outpatient PT for balance training.  2. RV dysfunction - RHC hemodynamics (05/25/22) showed RA 10 (large V waves), RV 28/6-10, PA 25/15,  PCWP 14,  - RV  dysfunction is now likely multifactorial: he is chronically RV paced with device interrogation from 05/15/22 demonstrating 96% RV pacing, he has a dilated RV leading to poor coaptation of the tricuspid valve and as a result severe TR and in addition has likely moderate to severe stenosis of his bioprosthetic mitral valve. From a hemodynamic standpoint though, the MS does not appear to be having a significant effect on his PA pressures or PCWP.  - Dr. Gasper Lloyd to discuss case with EP & structural cardiology. Prior to MVR/AVR, his LV function and RV function were intact. Since that time, serial echocardiograms demonstrate worsening TR and RV dilation. Although he has moderate to severe bioprosthetic MS it has not impacted his PA pressures or PCWP enough to compromise RV function to this point. RV dilation may be secondary to chronic RV pacing leading to TR due to poor valve coaptation.  - Continue Jardiance 10 mg daily. - Continue digoxin 0.125 mg daily. - Continue Lasix 20 mg daily.  3. Bioprosthetic mitral valve stenosis - Mean gradient of 11-51mmhg by TTE & invasive hemodynamics - Scheduled for TEE to further evaluate  4. Severe TR - Appears functional and secondary to RV dilation leading to poor coaptation.  - See above.   5. CAD - s/p CABG x 2, closure of PFO - AVR, MVR and CABG x 2 (LIMA-LAD, SVG to RCA) in 10/11/2018 - No chest pain. - No ASA with need for AC - Continue Toprol XL 25 mg bid  6. pAFL - ST on ECG today. - Increase beta blocker as above. - Continue Eliquis 5 mg bid  7. Anemia - Likely anemia of chronic disease - Recent Tsat 6%, iron 11, ferratin 272 - On oral ferrous sulfate.  Follow up with Dr. Gasper Lloyd next month, as scheduled.  Prince Rome, FNP-BC 06/09/22

## 2022-06-10 DIAGNOSIS — I509 Heart failure, unspecified: Secondary | ICD-10-CM | POA: Diagnosis not present

## 2022-06-10 DIAGNOSIS — C61 Malignant neoplasm of prostate: Secondary | ICD-10-CM | POA: Diagnosis not present

## 2022-06-14 LAB — LAB REPORT - SCANNED: PSA, Total: <0.015

## 2022-06-15 ENCOUNTER — Inpatient Hospital Stay (HOSPITAL_COMMUNITY)
Admission: EM | Admit: 2022-06-15 | Discharge: 2022-06-22 | DRG: 252 | Disposition: A | Discharge status: Home Health | Payer: Medicare HMO | Attending: Internal Medicine | Admitting: Internal Medicine

## 2022-06-15 ENCOUNTER — Emergency Department (HOSPITAL_COMMUNITY): Payer: Medicare HMO

## 2022-06-15 ENCOUNTER — Other Ambulatory Visit (HOSPITAL_COMMUNITY): Payer: Medicare HMO

## 2022-06-15 ENCOUNTER — Encounter (HOSPITAL_COMMUNITY)
Admission: EM | Disposition: A | Discharge status: Home Health | Payer: Self-pay | Source: Home / Self Care | Attending: Internal Medicine

## 2022-06-15 ENCOUNTER — Other Ambulatory Visit: Payer: Self-pay

## 2022-06-15 ENCOUNTER — Emergency Department (HOSPITAL_COMMUNITY): Payer: Medicare HMO | Admitting: Anesthesiology

## 2022-06-15 ENCOUNTER — Encounter (HOSPITAL_COMMUNITY): Payer: Self-pay

## 2022-06-15 DIAGNOSIS — I499 Cardiac arrhythmia, unspecified: Secondary | ICD-10-CM | POA: Diagnosis not present

## 2022-06-15 DIAGNOSIS — I7090 Unspecified atherosclerosis: Secondary | ICD-10-CM | POA: Diagnosis not present

## 2022-06-15 DIAGNOSIS — I2510 Atherosclerotic heart disease of native coronary artery without angina pectoris: Secondary | ICD-10-CM

## 2022-06-15 DIAGNOSIS — Z87891 Personal history of nicotine dependence: Secondary | ICD-10-CM | POA: Diagnosis not present

## 2022-06-15 DIAGNOSIS — Z923 Personal history of irradiation: Secondary | ICD-10-CM

## 2022-06-15 DIAGNOSIS — I6310 Cerebral infarction due to embolism of unspecified precerebral artery: Secondary | ICD-10-CM | POA: Diagnosis not present

## 2022-06-15 DIAGNOSIS — I742 Embolism and thrombosis of arteries of the upper extremities: Secondary | ICD-10-CM | POA: Diagnosis not present

## 2022-06-15 DIAGNOSIS — I330 Acute and subacute infective endocarditis: Secondary | ICD-10-CM | POA: Diagnosis present

## 2022-06-15 DIAGNOSIS — I739 Peripheral vascular disease, unspecified: Secondary | ICD-10-CM | POA: Diagnosis present

## 2022-06-15 DIAGNOSIS — I70202 Unspecified atherosclerosis of native arteries of extremities, left leg: Secondary | ICD-10-CM | POA: Diagnosis not present

## 2022-06-15 DIAGNOSIS — Z800 Family history of malignant neoplasm of digestive organs: Secondary | ICD-10-CM

## 2022-06-15 DIAGNOSIS — Z950 Presence of cardiac pacemaker: Secondary | ICD-10-CM

## 2022-06-15 DIAGNOSIS — Z7984 Long term (current) use of oral hypoglycemic drugs: Secondary | ICD-10-CM

## 2022-06-15 DIAGNOSIS — I361 Nonrheumatic tricuspid (valve) insufficiency: Secondary | ICD-10-CM | POA: Diagnosis not present

## 2022-06-15 DIAGNOSIS — I10 Essential (primary) hypertension: Secondary | ICD-10-CM | POA: Diagnosis not present

## 2022-06-15 DIAGNOSIS — R7881 Bacteremia: Secondary | ICD-10-CM

## 2022-06-15 DIAGNOSIS — I214 Non-ST elevation (NSTEMI) myocardial infarction: Secondary | ICD-10-CM | POA: Diagnosis present

## 2022-06-15 DIAGNOSIS — R4781 Slurred speech: Secondary | ICD-10-CM | POA: Diagnosis not present

## 2022-06-15 DIAGNOSIS — Z6832 Body mass index (BMI) 32.0-32.9, adult: Secondary | ICD-10-CM | POA: Diagnosis not present

## 2022-06-15 DIAGNOSIS — I639 Cerebral infarction, unspecified: Secondary | ICD-10-CM | POA: Diagnosis not present

## 2022-06-15 DIAGNOSIS — I70208 Unspecified atherosclerosis of native arteries of extremities, other extremity: Secondary | ICD-10-CM | POA: Diagnosis not present

## 2022-06-15 DIAGNOSIS — I480 Paroxysmal atrial fibrillation: Secondary | ICD-10-CM | POA: Diagnosis present

## 2022-06-15 DIAGNOSIS — I342 Nonrheumatic mitral (valve) stenosis: Secondary | ICD-10-CM | POA: Diagnosis not present

## 2022-06-15 DIAGNOSIS — R000 Tachycardia, unspecified: Secondary | ICD-10-CM | POA: Diagnosis not present

## 2022-06-15 DIAGNOSIS — I5082 Biventricular heart failure: Secondary | ICD-10-CM | POA: Diagnosis present

## 2022-06-15 DIAGNOSIS — E669 Obesity, unspecified: Secondary | ICD-10-CM | POA: Diagnosis present

## 2022-06-15 DIAGNOSIS — Z952 Presence of prosthetic heart valve: Secondary | ICD-10-CM

## 2022-06-15 DIAGNOSIS — E785 Hyperlipidemia, unspecified: Secondary | ICD-10-CM | POA: Diagnosis present

## 2022-06-15 DIAGNOSIS — Z85828 Personal history of other malignant neoplasm of skin: Secondary | ICD-10-CM

## 2022-06-15 DIAGNOSIS — I6349 Cerebral infarction due to embolism of other cerebral artery: Secondary | ICD-10-CM | POA: Diagnosis not present

## 2022-06-15 DIAGNOSIS — R29818 Other symptoms and signs involving the nervous system: Secondary | ICD-10-CM | POA: Diagnosis not present

## 2022-06-15 DIAGNOSIS — I7092 Chronic total occlusion of artery of the extremities: Secondary | ICD-10-CM | POA: Diagnosis not present

## 2022-06-15 DIAGNOSIS — K219 Gastro-esophageal reflux disease without esophagitis: Secondary | ICD-10-CM | POA: Diagnosis present

## 2022-06-15 DIAGNOSIS — Z8546 Personal history of malignant neoplasm of prostate: Secondary | ICD-10-CM

## 2022-06-15 DIAGNOSIS — Z951 Presence of aortocoronary bypass graft: Secondary | ICD-10-CM | POA: Diagnosis not present

## 2022-06-15 DIAGNOSIS — R350 Frequency of micturition: Secondary | ICD-10-CM | POA: Diagnosis present

## 2022-06-15 DIAGNOSIS — I351 Nonrheumatic aortic (valve) insufficiency: Secondary | ICD-10-CM | POA: Diagnosis not present

## 2022-06-15 DIAGNOSIS — I959 Hypotension, unspecified: Secondary | ICD-10-CM | POA: Diagnosis not present

## 2022-06-15 DIAGNOSIS — I080 Rheumatic disorders of both mitral and aortic valves: Secondary | ICD-10-CM | POA: Diagnosis not present

## 2022-06-15 DIAGNOSIS — R29700 NIHSS score 0: Secondary | ICD-10-CM | POA: Diagnosis present

## 2022-06-15 DIAGNOSIS — Z9889 Other specified postprocedural states: Secondary | ICD-10-CM | POA: Diagnosis not present

## 2022-06-15 DIAGNOSIS — I340 Nonrheumatic mitral (valve) insufficiency: Secondary | ICD-10-CM | POA: Diagnosis not present

## 2022-06-15 DIAGNOSIS — Z801 Family history of malignant neoplasm of trachea, bronchus and lung: Secondary | ICD-10-CM

## 2022-06-15 DIAGNOSIS — Z993 Dependence on wheelchair: Secondary | ICD-10-CM | POA: Diagnosis not present

## 2022-06-15 DIAGNOSIS — Z79899 Other long term (current) drug therapy: Secondary | ICD-10-CM

## 2022-06-15 DIAGNOSIS — Y831 Surgical operation with implant of artificial internal device as the cause of abnormal reaction of the patient, or of later complication, without mention of misadventure at the time of the procedure: Secondary | ICD-10-CM | POA: Diagnosis present

## 2022-06-15 DIAGNOSIS — I38 Endocarditis, valve unspecified: Secondary | ICD-10-CM

## 2022-06-15 DIAGNOSIS — B952 Enterococcus as the cause of diseases classified elsewhere: Secondary | ICD-10-CM | POA: Diagnosis present

## 2022-06-15 DIAGNOSIS — I4892 Unspecified atrial flutter: Secondary | ICD-10-CM | POA: Diagnosis not present

## 2022-06-15 DIAGNOSIS — I509 Heart failure, unspecified: Secondary | ICD-10-CM | POA: Diagnosis not present

## 2022-06-15 DIAGNOSIS — I5032 Chronic diastolic (congestive) heart failure: Secondary | ICD-10-CM | POA: Diagnosis not present

## 2022-06-15 DIAGNOSIS — Z8673 Personal history of transient ischemic attack (TIA), and cerebral infarction without residual deficits: Secondary | ICD-10-CM

## 2022-06-15 DIAGNOSIS — I252 Old myocardial infarction: Secondary | ICD-10-CM

## 2022-06-15 DIAGNOSIS — I4891 Unspecified atrial fibrillation: Secondary | ICD-10-CM | POA: Diagnosis not present

## 2022-06-15 DIAGNOSIS — I442 Atrioventricular block, complete: Secondary | ICD-10-CM | POA: Diagnosis present

## 2022-06-15 DIAGNOSIS — G319 Degenerative disease of nervous system, unspecified: Secondary | ICD-10-CM | POA: Diagnosis not present

## 2022-06-15 DIAGNOSIS — Z7901 Long term (current) use of anticoagulants: Secondary | ICD-10-CM

## 2022-06-15 DIAGNOSIS — T826XXA Infection and inflammatory reaction due to cardiac valve prosthesis, initial encounter: Secondary | ICD-10-CM | POA: Diagnosis present

## 2022-06-15 DIAGNOSIS — I998 Other disorder of circulatory system: Secondary | ICD-10-CM | POA: Diagnosis not present

## 2022-06-15 DIAGNOSIS — D6869 Other thrombophilia: Secondary | ICD-10-CM

## 2022-06-15 DIAGNOSIS — I82622 Acute embolism and thrombosis of deep veins of left upper extremity: Secondary | ICD-10-CM | POA: Diagnosis not present

## 2022-06-15 DIAGNOSIS — E871 Hypo-osmolality and hyponatremia: Secondary | ICD-10-CM | POA: Diagnosis present

## 2022-06-15 DIAGNOSIS — I339 Acute and subacute endocarditis, unspecified: Secondary | ICD-10-CM | POA: Diagnosis not present

## 2022-06-15 HISTORY — PX: AV FISTULA PLACEMENT: SHX1204

## 2022-06-15 LAB — PROTIME-INR
INR: 2 — ABNORMAL HIGH (ref 0.8–1.2)
INR: 1.6 — ABNORMAL HIGH (ref 0.8–1.2)
Prothrombin Time: 22.8 seconds — ABNORMAL HIGH (ref 11.4–15.2)
Prothrombin Time: 19.2 seconds — ABNORMAL HIGH (ref 11.4–15.2)

## 2022-06-15 LAB — CBC
HCT: 34.8 % — ABNORMAL LOW (ref 39.0–52.0)
HCT: 32 % — ABNORMAL LOW (ref 39.0–52.0)
Hemoglobin: 10.4 g/dL — ABNORMAL LOW (ref 13.0–17.0)
Hemoglobin: 10.1 g/dL — ABNORMAL LOW (ref 13.0–17.0)
MCH: 25 pg — ABNORMAL LOW (ref 26.0–34.0)
MCH: 25.2 pg — ABNORMAL LOW (ref 26.0–34.0)
MCHC: 29.9 g/dL — ABNORMAL LOW (ref 30.0–36.0)
MCHC: 31.6 g/dL (ref 30.0–36.0)
MCV: 83.7 fL (ref 80.0–100.0)
MCV: 79.8 fL — ABNORMAL LOW (ref 80.0–100.0)
Platelets: 79 10*3/uL — ABNORMAL LOW (ref 150–400)
Platelets: 84 10*3/uL — ABNORMAL LOW (ref 150–400)
RBC: 4.16 MIL/uL — ABNORMAL LOW (ref 4.22–5.81)
RBC: 4.01 MIL/uL — ABNORMAL LOW (ref 4.22–5.81)
RDW: 18.6 % — ABNORMAL HIGH (ref 11.5–15.5)
RDW: 18.6 % — ABNORMAL HIGH (ref 11.5–15.5)
WBC: 18.1 10*3/uL — ABNORMAL HIGH (ref 4.0–10.5)
WBC: 14 10*3/uL — ABNORMAL HIGH (ref 4.0–10.5)
nRBC: 0 % (ref 0.0–0.2)
nRBC: 0 % (ref 0.0–0.2)

## 2022-06-15 LAB — COMPREHENSIVE METABOLIC PANEL
ALT: 12 U/L (ref 0–44)
ALT: 14 U/L (ref 0–44)
AST: 26 U/L (ref 15–41)
AST: 25 U/L (ref 15–41)
Albumin: 2.3 g/dL — ABNORMAL LOW (ref 3.5–5.0)
Albumin: 2.6 g/dL — ABNORMAL LOW (ref 3.5–5.0)
Alkaline Phosphatase: 73 U/L (ref 38–126)
Alkaline Phosphatase: 74 U/L (ref 38–126)
Anion gap: 14 (ref 5–15)
Anion gap: 11 (ref 5–15)
BUN: 14 mg/dL (ref 8–23)
BUN: 18 mg/dL (ref 8–23)
CO2: 14 mmol/L — ABNORMAL LOW (ref 22–32)
CO2: 19 mmol/L — ABNORMAL LOW (ref 22–32)
Calcium: 7.7 mg/dL — ABNORMAL LOW (ref 8.9–10.3)
Calcium: 8.3 mg/dL — ABNORMAL LOW (ref 8.9–10.3)
Chloride: 106 mmol/L (ref 98–111)
Chloride: 101 mmol/L (ref 98–111)
Creatinine, Ser: 1.02 mg/dL (ref 0.61–1.24)
Creatinine, Ser: 1.08 mg/dL (ref 0.61–1.24)
GFR, Estimated: >60 mL/min (ref >60)
GFR, Estimated: >60 mL/min (ref >60)
Glucose, Bld: 165 mg/dL — ABNORMAL HIGH (ref 70–99)
Glucose, Bld: 190 mg/dL — ABNORMAL HIGH (ref 70–99)
Potassium: 3.3 mmol/L — ABNORMAL LOW (ref 3.5–5.1)
Potassium: 4.2 mmol/L (ref 3.5–5.1)
Sodium: 134 mmol/L — ABNORMAL LOW (ref 135–145)
Sodium: 131 mmol/L — ABNORMAL LOW (ref 135–145)
Total Bilirubin: 2 mg/dL — ABNORMAL HIGH (ref 0.3–1.2)
Total Bilirubin: 1.7 mg/dL — ABNORMAL HIGH (ref 0.3–1.2)
Total Protein: 5.9 g/dL — ABNORMAL LOW (ref 6.5–8.1)
Total Protein: 6.3 g/dL — ABNORMAL LOW (ref 6.5–8.1)

## 2022-06-15 LAB — I-STAT CHEM 8, ED
BUN: 16 mg/dL (ref 8–23)
Calcium, Ion: 1.01 mmol/L — ABNORMAL LOW (ref 1.15–1.40)
Chloride: 106 mmol/L (ref 98–111)
Creatinine, Ser: 0.9 mg/dL (ref 0.61–1.24)
Glucose, Bld: 171 mg/dL — ABNORMAL HIGH (ref 70–99)
HCT: 35 % — ABNORMAL LOW (ref 39.0–52.0)
Hemoglobin: 11.9 g/dL — ABNORMAL LOW (ref 13.0–17.0)
Potassium: 3.5 mmol/L (ref 3.5–5.1)
Sodium: 135 mmol/L (ref 135–145)
TCO2: 16 mmol/L — ABNORMAL LOW (ref 22–32)

## 2022-06-15 LAB — CBG MONITORING, ED: Glucose-Capillary: 159 mg/dL — ABNORMAL HIGH (ref 70–99)

## 2022-06-15 LAB — SURGICAL PCR SCREEN
MRSA, PCR: NEGATIVE
Staphylococcus aureus: NEGATIVE

## 2022-06-15 LAB — APTT: aPTT: 43 seconds — ABNORMAL HIGH (ref 24–36)

## 2022-06-15 LAB — TYPE AND SCREEN
ABO/RH(D): B POS
Antibody Screen: NEGATIVE

## 2022-06-15 LAB — DIFFERENTIAL
Abs Immature Granulocytes: 0.29 10*3/uL — ABNORMAL HIGH (ref 0.00–0.07)
Basophils Absolute: 0.1 10*3/uL (ref 0.0–0.1)
Basophils Relative: 0 %
Eosinophils Absolute: 0 10*3/uL (ref 0.0–0.5)
Eosinophils Relative: 0 %
Immature Granulocytes: 2 %
Lymphocytes Relative: 3 %
Lymphs Abs: 0.5 10*3/uL — ABNORMAL LOW (ref 0.7–4.0)
Monocytes Absolute: 1 10*3/uL (ref 0.1–1.0)
Monocytes Relative: 5 %
Neutro Abs: 16.3 10*3/uL — ABNORMAL HIGH (ref 1.7–7.7)
Neutrophils Relative %: 90 %

## 2022-06-15 LAB — ETHANOL: Alcohol, Ethyl (B): <10 mg/dL (ref <10)

## 2022-06-15 LAB — DIGOXIN LEVEL: Digoxin Level: 0.5 ng/mL — ABNORMAL LOW (ref 0.8–2.0)

## 2022-06-15 SURGERY — ARTERIOVENOUS (AV) FISTULA CREATION
Anesthesia: General | Site: Arm Upper | Laterality: Left

## 2022-06-15 MED ORDER — PANTOPRAZOLE SODIUM 40 MG PO TBEC
40.0000 mg | DELAYED_RELEASE_TABLET | Freq: Every day | ORAL | Status: DC
  Administered 2022-06-15 – 2022-06-22 (×8): 40 mg via ORAL
  Filled 2022-06-15 (×8): qty 1

## 2022-06-15 MED ORDER — HEPARIN (PORCINE) 25000 UT/250ML-% IV SOLN
1200.0000 [IU]/h | INTRAVENOUS | Status: DC
  Administered 2022-06-15 – 2022-06-17 (×2): 500 [IU]/h via INTRAVENOUS
  Administered 2022-06-18: 700 [IU]/h via INTRAVENOUS
  Administered 2022-06-20: 1200 [IU]/h via INTRAVENOUS
  Filled 2022-06-15 (×3): qty 250

## 2022-06-15 MED ORDER — BISACODYL 5 MG PO TBEC: 5.0000 mg | DELAYED_RELEASE_TABLET | Freq: Every day | ORAL | Status: DC | PRN

## 2022-06-15 MED ORDER — DOCUSATE SODIUM 100 MG PO CAPS
100.0000 mg | ORAL_CAPSULE | Freq: Two times a day (BID) | ORAL | Status: DC
  Administered 2022-06-15 – 2022-06-20 (×11): 100 mg via ORAL
  Filled 2022-06-15 (×14): qty 1

## 2022-06-15 MED ORDER — METOPROLOL TARTRATE 5 MG/5ML IV SOLN: 2.0000 mg | INTRAVENOUS | Status: DC | PRN

## 2022-06-15 MED ORDER — SODIUM CHLORIDE 0.9 % IV SOLN: 250.0000 mL | INTRAVENOUS | Status: DC | PRN

## 2022-06-15 MED ORDER — ONDANSETRON HCL 4 MG/2ML IJ SOLN: 4.0000 mg | Freq: Four times a day (QID) | INTRAMUSCULAR | Status: DC | PRN

## 2022-06-15 MED ORDER — OXYCODONE HCL 5 MG PO TABS: 5.0000 mg | ORAL_TABLET | Freq: Once | ORAL | Status: DC | PRN

## 2022-06-15 MED ORDER — HEPARIN SODIUM (PORCINE) 1000 UNIT/ML IJ SOLN
INTRAMUSCULAR | Status: AC
  Filled 2022-06-15: qty 20

## 2022-06-15 MED ORDER — SUGAMMADEX SODIUM 200 MG/2ML IV SOLN
INTRAVENOUS | Status: DC | PRN
  Administered 2022-06-15: 200 mg via INTRAVENOUS

## 2022-06-15 MED ORDER — HEPARIN 6000 UNIT IRRIGATION SOLUTION
Status: AC
  Filled 2022-06-15: qty 500

## 2022-06-15 MED ORDER — TRAMADOL HCL 50 MG PO TABS
50.0000 mg | ORAL_TABLET | Freq: Four times a day (QID) | ORAL | Status: DC | PRN
  Administered 2022-06-15 – 2022-06-20 (×4): 50 mg via ORAL
  Filled 2022-06-15 (×4): qty 1

## 2022-06-15 MED ORDER — ALBUMIN HUMAN 5 % IV SOLN
12.5000 g | Freq: Once | INTRAVENOUS | Status: AC
  Administered 2022-06-15: 12.5 g via INTRAVENOUS

## 2022-06-15 MED ORDER — ORAL CARE MOUTH RINSE: 15.0000 mL | Freq: Once | OROMUCOSAL | Status: AC

## 2022-06-15 MED ORDER — ROCURONIUM BROMIDE 10 MG/ML (PF) SYRINGE
PREFILLED_SYRINGE | INTRAVENOUS | Status: DC | PRN
  Administered 2022-06-15 (×2): 10 mg via INTRAVENOUS
  Administered 2022-06-15: 60 mg via INTRAVENOUS

## 2022-06-15 MED ORDER — HEPARIN SODIUM (PORCINE) 1000 UNIT/ML IJ SOLN
INTRAMUSCULAR | Status: DC | PRN
  Administered 2022-06-15: 10000 [IU] via INTRAVENOUS

## 2022-06-15 MED ORDER — ALBUMIN HUMAN 5 % IV SOLN
INTRAVENOUS | Status: AC
  Filled 2022-06-15: qty 250

## 2022-06-15 MED ORDER — 0.9 % SODIUM CHLORIDE (POUR BTL) OPTIME
TOPICAL | Status: DC | PRN
  Administered 2022-06-15: 1000 mL

## 2022-06-15 MED ORDER — HYDRALAZINE HCL 20 MG/ML IJ SOLN: 5.0000 mg | INTRAMUSCULAR | Status: DC | PRN

## 2022-06-15 MED ORDER — ALUM & MAG HYDROXIDE-SIMETH 200-200-20 MG/5ML PO SUSP: 15.0000 mL | ORAL | Status: DC | PRN

## 2022-06-15 MED ORDER — PHENYLEPHRINE 80 MCG/ML (10ML) SYRINGE FOR IV PUSH (FOR BLOOD PRESSURE SUPPORT)
PREFILLED_SYRINGE | INTRAVENOUS | Status: AC
  Filled 2022-06-15: qty 10

## 2022-06-15 MED ORDER — SODIUM CHLORIDE 0.9% FLUSH: 3.0000 mL | Freq: Once | INTRAVENOUS | Status: DC

## 2022-06-15 MED ORDER — CHLORHEXIDINE GLUCONATE 0.12 % MT SOLN
15.0000 mL | Freq: Once | OROMUCOSAL | Status: AC
  Administered 2022-06-15: 15 mL via OROMUCOSAL
  Filled 2022-06-15 (×2): qty 15

## 2022-06-15 MED ORDER — EMPAGLIFLOZIN 10 MG PO TABS
10.0000 mg | ORAL_TABLET | Freq: Every day | ORAL | Status: DC
  Administered 2022-06-16 – 2022-06-22 (×7): 10 mg via ORAL
  Filled 2022-06-15 (×7): qty 1

## 2022-06-15 MED ORDER — HEPARIN 6000 UNIT IRRIGATION SOLUTION
Status: DC | PRN
  Administered 2022-06-15: 1

## 2022-06-15 MED ORDER — ACETAMINOPHEN 325 MG PO TABS: 325.0000 mg | ORAL_TABLET | ORAL | Status: DC | PRN

## 2022-06-15 MED ORDER — OXYCODONE HCL 5 MG/5ML PO SOLN: 5.0000 mg | Freq: Once | ORAL | Status: DC | PRN

## 2022-06-15 MED ORDER — PHENYLEPHRINE HCL-NACL 20-0.9 MG/250ML-% IV SOLN
INTRAVENOUS | Status: DC | PRN
  Administered 2022-06-15: 30 ug/min via INTRAVENOUS

## 2022-06-15 MED ORDER — PROPOFOL 10 MG/ML IV BOLUS
INTRAVENOUS | Status: AC
  Filled 2022-06-15: qty 20

## 2022-06-15 MED ORDER — METOPROLOL TARTRATE 25 MG PO TABS
37.5000 mg | ORAL_TABLET | Freq: Two times a day (BID) | ORAL | Status: DC
  Administered 2022-06-15 – 2022-06-22 (×13): 37.5 mg via ORAL
  Filled 2022-06-15 (×14): qty 1

## 2022-06-15 MED ORDER — PHENYLEPHRINE 80 MCG/ML (10ML) SYRINGE FOR IV PUSH (FOR BLOOD PRESSURE SUPPORT)
PREFILLED_SYRINGE | INTRAVENOUS | Status: AC
  Filled 2022-06-15: qty 20

## 2022-06-15 MED ORDER — ATORVASTATIN CALCIUM 10 MG PO TABS
20.0000 mg | ORAL_TABLET | Freq: Every day | ORAL | Status: DC
  Administered 2022-06-15 – 2022-06-22 (×8): 20 mg via ORAL
  Filled 2022-06-15 (×8): qty 2

## 2022-06-15 MED ORDER — POTASSIUM CHLORIDE CRYS ER 20 MEQ PO TBCR
20.0000 meq | EXTENDED_RELEASE_TABLET | Freq: Once | ORAL | Status: AC
  Administered 2022-06-15: 40 meq via ORAL
  Filled 2022-06-15: qty 2

## 2022-06-15 MED ORDER — CEFAZOLIN SODIUM-DEXTROSE 2-4 GM/100ML-% IV SOLN
2.0000 g | Freq: Three times a day (TID) | INTRAVENOUS | Status: AC
  Administered 2022-06-15 – 2022-06-16 (×2): 2 g via INTRAVENOUS
  Filled 2022-06-15 (×2): qty 100

## 2022-06-15 MED ORDER — LACTATED RINGERS IV SOLN: INTRAVENOUS | Status: DC

## 2022-06-15 MED ORDER — LIDOCAINE-EPINEPHRINE (PF) 1 %-1:200000 IJ SOLN
INTRAMUSCULAR | Status: AC
  Filled 2022-06-15: qty 30

## 2022-06-15 MED ORDER — HEPARIN (PORCINE) 25000 UT/250ML-% IV SOLN
INTRAVENOUS | Status: AC
  Filled 2022-06-15: qty 250

## 2022-06-15 MED ORDER — LIDOCAINE 2% (20 MG/ML) 5 ML SYRINGE
INTRAMUSCULAR | Status: DC | PRN
  Administered 2022-06-15: 60 mg via INTRAVENOUS

## 2022-06-15 MED ORDER — SODIUM CHLORIDE 0.9 % IV SOLN
1.5000 g | INTRAVENOUS | Status: AC
  Administered 2022-06-15: 1.5 g via INTRAVENOUS
  Filled 2022-06-15: qty 1.5

## 2022-06-15 MED ORDER — FENTANYL CITRATE (PF) 250 MCG/5ML IJ SOLN
INTRAMUSCULAR | Status: DC | PRN
  Administered 2022-06-15: 100 ug via INTRAVENOUS

## 2022-06-15 MED ORDER — SODIUM CHLORIDE 0.9 % IV SOLN: INTRAVENOUS | Status: DC

## 2022-06-15 MED ORDER — ONDANSETRON HCL 4 MG/2ML IJ SOLN
INTRAMUSCULAR | Status: DC | PRN
  Administered 2022-06-15: 4 mg via INTRAVENOUS

## 2022-06-15 MED ORDER — GUAIFENESIN-DM 100-10 MG/5ML PO SYRP: 15.0000 mL | ORAL_SOLUTION | ORAL | Status: DC | PRN

## 2022-06-15 MED ORDER — IOHEXOL 350 MG/ML SOLN
175.0000 mL | Freq: Once | INTRAVENOUS | Status: AC | PRN
  Administered 2022-06-15: 175 mL via INTRAVENOUS

## 2022-06-15 MED ORDER — PROPOFOL 10 MG/ML IV BOLUS
INTRAVENOUS | Status: DC | PRN
  Administered 2022-06-15: 110 mg via INTRAVENOUS
  Administered 2022-06-15: 30 mg via INTRAVENOUS

## 2022-06-15 MED ORDER — DEXAMETHASONE SODIUM PHOSPHATE 10 MG/ML IJ SOLN
INTRAMUSCULAR | Status: AC
  Filled 2022-06-15: qty 1

## 2022-06-15 MED ORDER — MORPHINE SULFATE (PF) 2 MG/ML IV SOLN: 2.0000 mg | INTRAVENOUS | Status: DC | PRN

## 2022-06-15 MED ORDER — IODIXANOL 320 MG/ML IV SOLN
INTRAVENOUS | Status: DC | PRN
  Administered 2022-06-15: 28 mL via INTRA_ARTERIAL

## 2022-06-15 MED ORDER — LABETALOL HCL 5 MG/ML IV SOLN: 10.0000 mg | INTRAVENOUS | Status: DC | PRN

## 2022-06-15 MED ORDER — METOPROLOL TARTRATE 12.5 MG HALF TABLET
37.5000 mg | ORAL_TABLET | Freq: Once | ORAL | Status: AC
  Administered 2022-06-15: 37.5 mg via ORAL
  Filled 2022-06-15: qty 3

## 2022-06-15 MED ORDER — PHENYLEPHRINE 80 MCG/ML (10ML) SYRINGE FOR IV PUSH (FOR BLOOD PRESSURE SUPPORT)
PREFILLED_SYRINGE | INTRAVENOUS | Status: DC | PRN
  Administered 2022-06-15: 80 ug via INTRAVENOUS
  Administered 2022-06-15: 160 ug via INTRAVENOUS

## 2022-06-15 MED ORDER — FENTANYL CITRATE (PF) 250 MCG/5ML IJ SOLN
INTRAMUSCULAR | Status: AC
  Filled 2022-06-15: qty 5

## 2022-06-15 MED ORDER — FENTANYL CITRATE (PF) 100 MCG/2ML IJ SOLN: 25.0000 ug | INTRAMUSCULAR | Status: DC | PRN

## 2022-06-15 MED ORDER — SODIUM CHLORIDE 0.9% FLUSH: 3.0000 mL | INTRAVENOUS | Status: DC | PRN

## 2022-06-15 MED ORDER — PHENOL 1.4 % MT LIQD: 1.0000 | OROMUCOSAL | Status: DC | PRN

## 2022-06-15 MED ORDER — ROCURONIUM BROMIDE 10 MG/ML (PF) SYRINGE
PREFILLED_SYRINGE | INTRAVENOUS | Status: AC
  Filled 2022-06-15: qty 10

## 2022-06-15 MED ORDER — DIGOXIN 125 MCG PO TABS
0.1250 mg | ORAL_TABLET | Freq: Every day | ORAL | Status: DC
  Administered 2022-06-15 – 2022-06-22 (×8): 0.125 mg via ORAL
  Filled 2022-06-15 (×8): qty 1

## 2022-06-15 MED ORDER — ACETAMINOPHEN 325 MG RE SUPP: 325.0000 mg | RECTAL | Status: DC | PRN

## 2022-06-15 MED ORDER — SODIUM CHLORIDE 0.9% FLUSH
3.0000 mL | Freq: Two times a day (BID) | INTRAVENOUS | Status: DC
  Administered 2022-06-15 – 2022-06-21 (×9): 3 mL via INTRAVENOUS

## 2022-06-15 MED ORDER — SENNOSIDES-DOCUSATE SODIUM 8.6-50 MG PO TABS: 1.0000 | ORAL_TABLET | Freq: Every evening | ORAL | Status: DC | PRN

## 2022-06-15 MED ORDER — DEXAMETHASONE SODIUM PHOSPHATE 10 MG/ML IJ SOLN
INTRAMUSCULAR | Status: DC | PRN
  Administered 2022-06-15: 5 mg via INTRAVENOUS

## 2022-06-15 MED ORDER — FUROSEMIDE 20 MG PO TABS
20.0000 mg | ORAL_TABLET | Freq: Two times a day (BID) | ORAL | Status: DC
  Administered 2022-06-16 – 2022-06-18 (×4): 20 mg via ORAL
  Filled 2022-06-15 (×5): qty 1

## 2022-06-15 MED ORDER — LIDOCAINE 2% (20 MG/ML) 5 ML SYRINGE
INTRAMUSCULAR | Status: AC
  Filled 2022-06-15: qty 5

## 2022-06-15 SURGICAL SUPPLY — 47 items
ARMBAND PINK RESTRICT EXTREMIT (MISCELLANEOUS) ×1 IMPLANT
BAG COUNTER SPONGE SURGICOUNT (BAG) ×1 IMPLANT
CANISTER SUCT 3000ML PPV (MISCELLANEOUS) ×1 IMPLANT
CATH BEACON 5 .035 40 KMP TP (CATHETERS) IMPLANT
CATH BEACON 5 .038 40 KMP TP (CATHETERS) ×1
CATH EMB 3FR 40 (CATHETERS) IMPLANT
CATH EMB 4FR 40 (CATHETERS) IMPLANT
CATH EMB 4FR 80 (CATHETERS) IMPLANT
CATH POLY DUAL LUMEN OCCL 5FR (CATHETERS) IMPLANT
CLIP LIGATING EXTRA MED SLVR (CLIP) ×1 IMPLANT
CLIP LIGATING EXTRA SM BLUE (MISCELLANEOUS) ×1 IMPLANT
CNTNR URN SCR LID CUP LEK RST (MISCELLANEOUS) IMPLANT
CONT SPEC 4OZ STRL OR WHT (MISCELLANEOUS) ×1
COVER PROBE W GEL 5X96 (DRAPES) IMPLANT
DERMABOND ADVANCED .7 DNX12 (GAUZE/BANDAGES/DRESSINGS) ×1 IMPLANT
DEVICE TORQUE KENDALL .025-038 (MISCELLANEOUS) IMPLANT
ELECT REM PT RETURN 9FT ADLT (ELECTROSURGICAL) ×1
ELECTRODE REM PT RTRN 9FT ADLT (ELECTROSURGICAL) ×1 IMPLANT
GLOVE BIO SURGEON STRL SZ7.5 (GLOVE) ×1 IMPLANT
GOWN STRL REUS W/ TWL LRG LVL3 (GOWN DISPOSABLE) ×2 IMPLANT
GOWN STRL REUS W/ TWL XL LVL3 (GOWN DISPOSABLE) ×1 IMPLANT
GOWN STRL REUS W/TWL LRG LVL3 (GOWN DISPOSABLE) ×2
GOWN STRL REUS W/TWL XL LVL3 (GOWN DISPOSABLE) ×1
GUIDEWIRE ANGLED .035X150CM (WIRE) IMPLANT
INSERT FOGARTY SM (MISCELLANEOUS) IMPLANT
KIT BASIN OR (CUSTOM PROCEDURE TRAY) ×1 IMPLANT
KIT TURNOVER KIT B (KITS) ×1 IMPLANT
NS IRRIG 1000ML POUR BTL (IV SOLUTION) ×1 IMPLANT
PACK CV ACCESS (CUSTOM PROCEDURE TRAY) ×1 IMPLANT
PAD ARMBOARD 7.5X6 YLW CONV (MISCELLANEOUS) ×2 IMPLANT
POWDER SURGICEL 3.0 GRAM (HEMOSTASIS) IMPLANT
SET MICROPUNCTURE 5F STIFF (MISCELLANEOUS) IMPLANT
SHEATH PINNACLE 5F 10CM (SHEATH) IMPLANT
SLING ARM FOAM STRAP LRG (SOFTGOODS) IMPLANT
SLING ARM FOAM STRAP MED (SOFTGOODS) IMPLANT
STOPCOCK 4 WAY LG BORE MALE ST (IV SETS) IMPLANT
SUT MNCRL AB 4-0 PS2 18 (SUTURE) ×1 IMPLANT
SUT PROLENE 6 0 BV (SUTURE) ×1 IMPLANT
SUT VIC AB 3-0 SH 27 (SUTURE) ×1
SUT VIC AB 3-0 SH 27X BRD (SUTURE) ×1 IMPLANT
SYR 10ML LL (SYRINGE) IMPLANT
SYR BULB IRRIG 60ML STRL (SYRINGE) IMPLANT
TOWEL GREEN STERILE (TOWEL DISPOSABLE) ×1 IMPLANT
TUBING CIL FLEX 10 FLL-RA (TUBING) IMPLANT
UNDERPAD 30X36 HEAVY ABSORB (UNDERPADS AND DIAPERS) ×1 IMPLANT
WATER STERILE IRR 1000ML POUR (IV SOLUTION) ×1 IMPLANT
WIRE BENTSON .035X145CM (WIRE) IMPLANT

## 2022-06-15 NOTE — ED Triage Notes (Signed)
PT BIB [facility] as a Code Stroke. LKW 2100 when pt went to bed.  Pt woke up approx. 6am with numbness and heaviness in left arm, cold to touch and a missing pulse on left.  EMS endorsess significant differences in VS between left and right arm.  Right arm is warm with strong pulse and sensation. EMs endorses some slurred speech.  Pt had hx  of Stemi 2weeks with catheterization.  EMS VS 110/58 HR 115 (afib), CBG 225

## 2022-06-15 NOTE — Consult Note (Addendum)
Cardiology Consultation   Patient ID: [name] MRN: [id number]; DOB: [birth year]  Admit date: 06/15/2022 Date of Consult: 06/15/2022  PCP:  [name], MD   [facility] Providers Cardiologist:  Verne Carrow, MD        Patient Profile:   [name] is a 72 y.o. male with a hx of CAD with CABG X 2 vessel and L atrial clipping, + foramen ovale closure, s/p aortic and MVR bioprosthetic and PPM, atrial flutter, tobacco abuse, HLD who is being seen 06/15/2022 for the evaluation of acute thrombus to LUE s/p urgent surgery at the request of Dr. Randie Heinz.  History of Present Illness:   [name] with hx of CAD s/p CABG 2020 (LIMA to LAD, and VG to RCA found on work up for AS and MS), had surgical bioprosthetic AVR and MVR, along with LAA clipping and PFO closure.  Post op had bradycardia and a flutter and underwent PPM- MDT dual chamber.  Last interrogated 05/15/22 with normal device function, 2 NSVT arrhythmias and an 85 second atrial arrhythmia detected.   On 05/23/22 pt presented with SOB fatigue and nausea, his troponin was 185 and BNP 312.  INR was 3.2 EKG with a pacing and V pacing.  His warfarin was held and TTE completed with EF 55-60%, RV was mod reduced.  RV severely enlarged.Aortic valve mean gradient measures 7.0 mmHg. Aortic valve peak gradient measures 12.7 mmHg. There is a 23 mm  bioprosthetic valve present in  the aortic position. Echo findings are consistent with normal structure and function of the aortic valve prosthesis.   at least moderate prosthetic valve stenosis with mean gradient at HR 95bpm. The mitral valve has been  repaired/replaced. Trivial mitral valve regurgitation. There is a 27 mm Medtronic bioprosthetic valve present in the mitral position. MV peak gradient, 16.8 mmHg. The mean mitral valve  gradient is 11.0 mmHg. Severe TR trivial PR.    Venous doppler revealed There appears to be, what may be consistent with, a partially thrombosed  pseudoaneurysm in the popliteal fossa/proximal calf area adjacent to an  effusion filled with mixed echoes. Further imaging should be considered to  differentiate/classify findings.   Pt underwent cardiac cath 05/25/22 with VG to RCA patent and LIMA to LAD patent Severe mid LAD stenosis. Competitive filling is seen distally from the patent LIMA graft.  Near normal right and left heart pressures.  CO 6.92 L/min CI 3.33 --Rt brachial vein was used for RHC and unable to access Lt radial artery so Rt groin was used.    It was felt the symptoms were related to his severe TR.  Plans for referral to outpt AHF.  Pt was changed from coumadin to Eliquis. He was discharged on 20 mg lasix BID  --he was seen by Dr Gasper Lloyd 06/03/22 "RV dysfunction is now likely multifactorial: he is chronically RV paced with device interrogation from 05/15/22 demonstrating 96% RV pacing, he has a dilated RV leading to poor coaptation of the tricuspid valve and as a result severe TR and in addition has likely moderate to severe stenosis of his bioprosthetic mitral valve. From a hemodynamic standpoint though the MS does not appear to be having a significant effect on his PA pressures or PCWP"  dig was started along with jardiance.  Prior to his MVR and AVR and pacing his LV function and RV function were intact.    Plans for TEE were made.    He was seen back  06/09/22 for acute visit with increased weakness and falls at home.  His legs give out.  He had an episode of confusion 1-2 weeks ago.  No chest pain. He is WC bound due to LE pain from arthritis.  His BNP was 344 so lasix increased.     Today pt presented to ER by EMS aas COde Stroke, when pt woke at 6 a he had numbness and heaviness in Left arm and it was cold to touch and no pulse palpated.  Rt arm was warm with a pulse. His last dose of Eliquis was 06/14/22 pm.  He reported slurred speech to EMS so neuro saw and CTA of head and neck were negative.  CTA of the chest does reveal a  high right brachial arterial occlusion.  MRI of brain  Dr. Randie Heinz was consulted and imaging Left upper extremity: left subclavian, vertebral, and axillary arteries are patent. There is acute occlusion of the high left brachial artery at the level of the left humeral neck. Below this the left brachial artery is non-opacified. Limited assessment of the more peripheral left upper extremities because of the proximal brachial artery occlusion.  NON palpable/doppler signal left radial ulnar or palmer.  Weak brachial doppler signal, motor and gross sensation intact left UE  Pt has undergone lt upper extremity thromboembolectomy via brachial and radial artery approach. Left upper extremity angiogram with catheter selection of left subclavian and axillary arteries      EKG:  The EKG was personally reviewed and demonstrates:  V paced with underlying flutter most likely -- on 06/09/22 a sensed and V paced  Telemetry:  Telemetry was personally reviewed and demonstrates:  v apcing  Na 134, K+ 3.3 now 3.5 glucose 165 BUN 14 Cr 1.02 T protein 5.9 total bilirubin 2.0  WBC 18.1 Hgb 10.4 plts 79 was 87 on 5/1 previously  159 to 134 (in 2020 was 123- 122)  Dig level today 0.5  INR was 2.0   BP 116/86 P 92 R 20-29 temp 99.3   No chest pain no SOB he was feeling a little better with increase of BB.   Past Medical History:  Diagnosis Date   BCE (basal cell epithelioma), arm    RIGHT SHOULDER   Bradycardia    Bumps on skin    on left side of nose for last 3 weeks   CAD (coronary artery disease)    a. s/p CABGx2 10/11/18 LIMA to LAD, SVG to RCA, EVH via right thigh.   Dyslipidemia    Ectatic thoracic aorta (HCC)    Esophageal stricture    GERD (gastroesophageal reflux disease)    Heart murmur    sees dr Rose Fillers   Hyperlipidemia    Incidental pulmonary nodule, > 3mm and < 8mm 10/04/2018   Noted on CTA   Inguinal hernia    bilateral   Obesity    Presence of permanent cardiac pacemaker    Prostate cancer  (HCC)    S/P aortic valve replacement with bioprosthetic valve 10/11/2018   23 mm Edwards Inspiris Resilia stented bovine pericardial tissue valve, miltral valve done also   S/P CABG x 2 10/11/2018   LIMA to LAD, SVG to RCA, EVH via right thigh   S/P mitral valve replacement with bioprosthetic valve 10/11/2018   27 mm Medtronic Mosaic stented porcine bioprosthetic tissue valve   S/P patent foramen ovale closure 10/11/2018   S/P placement of cardiac pacemaker    Smoker     Past Surgical History:  Procedure Laterality Date   AORTIC VALVE REPLACEMENT N/A 10/11/2018   Procedure: AORTIC VALVE REPLACEMENT (AVR) with 23 Inspiris Bioprosthetic Aortic valve.;  Surgeon: Purcell Nails, MD;  Location: [facility] OR;  Service: Open Heart Surgery;  Laterality: N/A;   CLIPPING OF ATRIAL APPENDAGE N/A 10/11/2018   Procedure: CLIPPING OF LEFT ATRIAL APPENDAGE with 45 AtriCure Clip.;  Surgeon: Purcell Nails, MD;  Location: [facility] OR;  Service: Open Heart Surgery;  Laterality: N/A;   COLONOSCOPY  03/2008,2004   Dr. Madilyn Fireman   CORONARY ARTERY BYPASS GRAFT N/A 10/11/2018   Procedure: CORONARY ARTERY BYPASS GRAFTING (CABG) x 2 using LIMA to the LAD and endoscopic greater saphenous vein harvesting to the RCA.;  Surgeon: Purcell Nails, MD;  Location: [facility] OR;  Service: Open Heart Surgery;  Laterality: N/A;   CYSTOSCOPY  04/04/2019   Procedure: CYSTOSCOPY;  Surgeon: Rene Paci, MD;  Location: [facility];  Service: Urology;;   HERNIA REPAIR  12/11/10   BIH   MITRAL VALVE REPLACEMENT N/A 10/11/2018   Procedure: MITRAL VALVE (MV) REPLACEMENT with 27 Mosaic Bioprosthetic Mitral valve.;  Surgeon: Purcell Nails, MD;  Location: [facility] OR;  Service: Open Heart Surgery;  Laterality: N/A;   PACEMAKER IMPLANT N/A 10/17/2018   Procedure: PACEMAKER IMPLANT;  Surgeon: Hillis Range, MD;  Location: [facility] INVASIVE CV LAB;  Service: Cardiovascular;  Laterality: N/A;   RADIOACTIVE SEED IMPLANT N/A 04/04/2019   Procedure:  RADIOACTIVE SEED IMPLANT/BRACHYTHERAPY IMPLANT, cystoscopy;  Surgeon: Rene Paci, MD;  Location: [facility];  Service: Urology;  Laterality: N/A;  58 seeds   REPAIR OF PATENT FORAMEN OVALE N/A 10/11/2018   Procedure: CLOSURE OF PATENT FORAMEN OVALE;  Surgeon: Purcell Nails, MD;  Location: [facility] OR;  Service: Open Heart Surgery;  Laterality: N/A;   RIGHT/LEFT HEART CATH AND CORONARY ANGIOGRAPHY N/A 09/15/2018   Procedure: RIGHT/LEFT HEART CATH AND CORONARY ANGIOGRAPHY;  Surgeon: Kathleene Hazel, MD;  Location: [facility]VASIVE CV LAB;  Service: Cardiovascular;  Laterality: N/A;   RIGHT/LEFT HEART CATH AND CORONARY/GRAFT ANGIOGRAPHY N/A 05/25/2022   Procedure: RIGHT/LEFT HEART CATH AND CORONARY/GRAFT ANGIOGRAPHY;  Surgeon: Kathleene Hazel, MD;  Location: [facility] INVASIVE CV LAB;  Service: Cardiovascular;  Laterality: N/A;   TEE WITHOUT CARDIOVERSION N/A 12/12/2015   Procedure: TRANSESOPHAGEAL ECHOCARDIOGRAM (TEE);  Surgeon: Thurmon Fair, MD;  Location: [facility] ENDOSCOPY;  Service: Cardiovascular;  Laterality: N/A;   TEE WITHOUT CARDIOVERSION N/A 10/11/2018   Procedure: TRANSESOPHAGEAL ECHOCARDIOGRAM (TEE);  Surgeon: Purcell Nails, MD;  Location: [facility] OR;  Service: Open Heart Surgery;  Laterality: N/A;   THYMECTOMY  1976   radaiation tx done     Home Medications:  Prior to Admission medications   Medication Sig Start Date End Date Taking? Authorizing Provider  acetaminophen (TYLENOL) 325 MG tablet Take 650 mg by mouth as needed for moderate pain.   Yes [provider]  apixaban (ELIQUIS) 5 MG TABS tablet Take 1 tablet (5 mg total) by mouth 2 (two) times daily. 05/26/22  Yes Kathlen Mody, MD  atorvastatin (LIPITOR) 20 MG tablet Take 1 tablet (20 mg total) by mouth daily. 05/18/22  Yes [name], MD  digoxin (LANOXIN) 0.125 MG tablet Take 1 tablet (0.125 mg total) by mouth daily. 06/03/22  Yes Sabharwal, Aditya, DO  empagliflozin (JARDIANCE) 10 MG TABS tablet  Take 1 tablet (10 mg total) by mouth daily before breakfast. 06/03/22  Yes Sabharwal, Aditya, DO  furosemide (LASIX) 20 MG tablet Take 20 mg by mouth  2 (two) times daily.   Yes [provider]  Iron, Ferrous Sulfate, 325 (65 Fe) MG TABS Take 1 tablet by mouth 2 (two) times daily. Patient taking differently: Take 1 tablet by mouth daily. 05/26/22  Yes Kathlen Mody, MD  metoprolol tartrate (LOPRESSOR) 25 MG tablet Take 1.5 tablets (37.5 mg total) by mouth 2 (two) times daily. 06/09/22  Yes Milford, Anderson Malta, FNP  oxymetazoline (AFRIN) 0.05 % nasal spray Place 1 spray into both nostrils at bedtime as needed for congestion.   Yes [provider]  polyethylene glycol powder (GLYCOLAX/MIRALAX) 17 GM/SCOOP powder Take 17 g by mouth daily as needed for mild constipation. 05/26/22  Yes Kathlen Mody, MD    Inpatient Medications: Scheduled Meds:  [MAR Hold] sodium chloride flush  3 mL Intravenous Once   Continuous Infusions:  sodium chloride     albumin human     heparin     heparin 500 Units/hr (06/15/22 1423)   lactated ringers Stopped (06/15/22 1414)   PRN Meds: sodium chloride, albumin human, fentaNYL (SUBLIMAZE) injection, heparin, hydrALAZINE, labetalol, ondansetron (ZOFRAN) IV, oxyCODONE **OR** oxyCODONE  Allergies:   No Known Allergies  Social History:   Social History   Socioeconomic History   Marital status: Married    Spouse name: Not on file   Number of children: 0   Years of education: Not on file   Highest education level: Not on file  Occupational History   Occupation: Market researcher: [employer]  Tobacco Use   Smoking status: Former    Packs/day: 0.25    Years: 35.00    Additional pack years: 0.00    Total pack years: 8.75    Types: Cigarettes    Quit date: 09/04/2018    Years since quitting: 3.7   Smokeless tobacco: Never  Vaping Use   Vaping Use: Never used  Substance and Sexual Activity   Alcohol use: Yes    Alcohol/week: 1.0 standard  drink of alcohol    Types: 1 Cans of beer per week    Comment: rare   Drug use: No   Sexual activity: Yes  Other Topics Concern   Not on file  Social History Narrative   Not on file   Social Determinants of Health   Financial Resource Strain: Low Risk  (05/11/2022)   Overall Financial Resource Strain (CARDIA)    Difficulty of Paying Living Expenses: Not hard at all  Food Insecurity: No Food Insecurity (05/27/2022)   Hunger Vital Sign    Worried About Running Out of Food in the Last Year: Never true    Ran Out of Food in the Last Year: Never true  Transportation Needs: No Transportation Needs (05/27/2022)   PRAPARE - Administrator, Civil Service (Medical): No    Lack of Transportation (Non-Medical): No  Physical Activity: Inactive (05/11/2022)   Exercise Vital Sign    Days of Exercise per Week: 0 days    Minutes of Exercise per Session: 0 min  Stress: No Stress Concern Present (05/11/2022)   [employer] of Occupational Health - Occupational Stress Questionnaire    Feeling of Stress : Not at all  Social Connections: Not on file  Intimate Partner Violence: Not At Risk (05/23/2022)   Humiliation, Afraid, Rape, and Kick questionnaire    Fear of Current or Ex-Partner: No    Emotionally Abused: No    Physically Abused: No    Sexually Abused: No    Family History:  Family History  Problem Relation Age of Onset   Colon cancer Mother    Lung cancer Father        smoker   CAD Neg Hx    Pancreatic cancer Neg Hx    Breast cancer Neg Hx      ROS:  Please see the history of present illness.  General:no colds or fevers, no weight changes Skin:no rashes or ulcers HEENT:no blurred vision, no congestion CV:see HPI PUL:see HPI GI:no diarrhea constipation or melena, no indigestion GU:no hematuria, no dysuria MS:no joint pain, no claudication--see HPI Neuro:no syncope, no lightheadedness Endo:no diabetes, no thyroid disease  All other ROS reviewed and negative.      Physical Exam/Data:   Vitals:   06/15/22 1430 06/15/22 1445 06/15/22 1500 06/15/22 1515  BP: (!) 91/51 (!) 82/63 (!) 87/67 (!) 84/67  Pulse: 87 (!) 109 94 95  Resp: 20 (!) 25 20 20   Temp:      TempSrc:      SpO2: 96% 97% 96% 93%  Weight:      Height:        Intake/Output Summary (Last 24 hours) at 06/15/2022 1527 Last data filed at 06/15/2022 1414 Gross per 24 hour  Intake 800 ml  Output 150 ml  Net 650 ml      06/15/2022    9:19 AM 06/09/2022    3:09 PM 06/03/2022   11:46 AM  Last 3 Weights  Weight (lbs) 209 lb 209 lb 212 lb  Weight (kg) 94.802 kg 94.802 kg 96.163 kg     Body mass index is 32.73 kg/m.  General:  Well nourished, well developed, in no acute distress resting comfortably in PACU HEENT: normal Neck: no JVD Vascular: No carotid bruits; Distal pulses 2+ bilaterally Cardiac:  normal S1, S2; RRR; + murmur no gallup or rub Lungs:  clear to auscultation bilaterally, no wheezing, rhonchi or rales  Abd: soft, nontender, no hepatomegaly  Ext: no edema Musculoskeletal:  No deformities, BUE and BLE strength normal and equal Skin: warm and dry  Neuro:  CNs 2-12 intact, no focal abnormalities noted Psych:  Normal affect   Relevant CV Studies: ECHO: - 05/23/22: Normal LV function, severely dilated RV with moderately reduced function, moderate stenosis of prosthesis (gradient ), severe TR due to poor coaptation of leaflets, s/p AVR as per my personal interpretation - 06/13/19: Normal LV function with septal bowing to the left; dilated RV with mildly reduced function. Mitral bioprosthesis gradient of - 06/27/18: Normal LV function, normal RV function. Severe MS/AS with moderate AI.    CATH: - 05/25/22:   Prox LAD lesion is 40% stenosed.   Mid LAD lesion is 95% stenosed.   Prox Cx to Mid Cx lesion is 30% stenosed.   1st Mrg lesion is 30% stenosed.   3rd Mrg lesion is 30% stenosed.   Prox RCA lesion is 100% stenosed.   SVG graft was visualized by angiography  and is normal in caliber.   LIMA graft was visualized by angiography and is normal in caliber.   Severe mid LAD stenosis. Competitive filling is seen distally from the patent LIMA graft.  Patent LIMA graft to the mid LAD.  Mild non-obstructive disease in the Circumflex artery.  Large dominant RCA with chronic proximal occlusion. The mid and distal RCA fills from the patent vein graft.  Near normal right and left heart pressures.  CO 6.92 L/min CI 3.33    Diagnostic Dominance: Right  Intervention  - 09/15/2018: Prox  RCA lesion is 100% stenosed. Prox Cx to Mid Cx lesion is 30% stenosed. 1st Mrg lesion is 30% stenosed. 3rd Mrg lesion is 30% stenosed. Prox LAD lesion is 40% stenosed. Mid LAD lesion is 95% stenosed.   1. Severe stenosis mid LAD 2. Mild non-obstructive disease in the LAD 3. Chronic occlusion of the proximal RCA. Filling of the proximal, mid and distal RCA from left to right collaterals 4. Severe aortic stenosis (mean gradient 34.1 mmHg, peak to peak gradient 32 mmHg, AVA 1.1 cm2) 5. Severe mitral stenosis by echo.   Laboratory Data:  High Sensitivity Troponin:   Recent Labs  Lab 05/23/22 1126 05/23/22 1351 05/24/22 0826 05/24/22 1016  TROPONINIHS 185* 178* 88* 72*     Chemistry Recent Labs  Lab 06/09/22 1552 06/15/22 0830 06/15/22 0835  NA 130* 134* 135  K 3.5 3.3* 3.5  CL 99 106 106  CO2 21* 14*  --   GLUCOSE 119* 165* 171*  BUN 14 14 16   CREATININE 0.98 1.02 0.90  CALCIUM 8.5* 7.7*  --   MG 2.2  --   --   GFRNONAA >60 >60  --   ANIONGAP 10 14  --     Recent Labs  Lab 06/09/22 1552 06/15/22 0830  PROT 7.3 5.9*  ALBUMIN 2.8* 2.3*  AST 25 26  ALT 15 12  ALKPHOS 81 73  BILITOT 2.1* 2.0*   Lipids No results for input(s): "CHOL", "TRIG", "HDL", "LABVLDL", "LDLCALC", "CHOLHDL" in the last 168 hours.  Hematology Recent Labs  Lab 06/09/22 1552 06/15/22 0835 06/15/22 0845  WBC 14.7*  --  18.1*  RBC 4.77  --  4.16*  HGB 12.2* 11.9* 10.4*   HCT 37.1* 35.0* 34.8*  MCV 77.8*  --  83.7  MCH 25.6*  --  25.0*  MCHC 32.9  --  29.9*  RDW 17.8*  --  18.6*  PLT 87*  --  79*   Thyroid No results for input(s): "TSH", "FREET4" in the last 168 hours.  BNP Recent Labs  Lab 06/09/22 1552  BNP 442.7*    DDimer No results for input(s): "DDIMER" in the last 168 hours.   Radiology/Studies:  HYBRID OR IMAGING ([facility] ONLY)  Result Date: 06/15/2022 There is no interpretation for this exam.  This order is for images obtained during a surgical procedure.  Please See "Surgeries" Tab for more information regarding the procedure.   CT ANGIO UP EXTREM LEFT W &/OR WO CONTAST  Result Date: 06/15/2022 CLINICAL DATA:  Acute aortic syndrome, left arm weakness, diminished pulses EXAM: CT ANGIOGRAPHY OF THE LEFT UPPEREXTREMITY TECHNIQUE: Multidetector CT imaging of the LEFT upperwas performed using the standard protocol during bolus administration of intravenous contrast. Multiplanar CT image reconstructions and MIPs were obtained to evaluate the vascular anatomy. RADIATION DOSE REDUCTION: This exam was performed according to the departmental dose-optimization program which includes automated exposure control, adjustment of the mA and/or kV according to patient size and/or use of iterative reconstruction technique. CONTRAST:  OMNIPAQUE IOHEXOL 350 MG/ML SOLN COMPARISON:  None Available. FINDINGS: Included chest demonstrates atherosclerosis of aorta without significant aneurysm. No aortic dissection, mediastinal hemorrhage or hematoma. Postop changes from coronary bypass. Native coronary atherosclerosis and aortic valve calcifications present. Included central pulmonary arteries are patent. Bovine arch anatomy appearing patent and tortuosity. Included lungs demonstrate no acute airspace process. Basilar atelectasis. Pleural thickening noted on the left. No acute osseous finding. Left upper extremity: left subclavian, vertebral, and axillary arteries are patent.  There is acute occlusion  of the high left brachial artery at the level of the left humeral neck. Below this the left brachial artery is non-opacified. Limited assessment of the more peripheral left upper extremities because of the proximal brachial artery occlusion. Review of the MIP images confirms the above findings. IMPRESSION: Acute occlusion of the high left proximal brachial artery at the level of the left humeral neck. These results were called by telephone at the time of interpretation on 06/15/2022 at 10:17 am to provider Dr. Atilano Median, who verbally acknowledged these results. 4 Electronically Signed   By: Judie Petit.  Shick M.D.   On: 06/15/2022 10:19   CT HEAD CODE STROKE WO CONTRAST  Result Date: 06/15/2022 CLINICAL DATA:  Code stroke.  Left arm weakness and slurred speech. EXAM: CT ANGIOGRAPHY HEAD AND NECK TECHNIQUE: Multidetector CT imaging of the head and neck was performed using the standard protocol during bolus administration of intravenous contrast. Multiplanar CT image reconstructions and MIPs were obtained to evaluate the vascular anatomy. Carotid stenosis measurements (when applicable) are obtained utilizing NASCET criteria, using the distal internal carotid diameter as the denominator. RADIATION DOSE REDUCTION: This exam was performed according to the departmental dose-optimization program which includes automated exposure control, adjustment of the mA and/or kV according to patient size and/or use of iterative reconstruction technique. CONTRAST:  OMNIPAQUE IOHEXOL 350 MG/ML SOLN COMPARISON:  None Available. FINDINGS: CT HEAD FINDINGS Brain: There is no acute intracranial hemorrhage, extra-axial fluid collection, or acute infarct. Parenchymal volume is normal. The ventricles are normal in size. Wilmott-white differentiation is preserved. The pituitary and suprasellar region are normal. There is no mass lesion. There is no mass effect or midline shift. Vascular: No hyperdense vessel is seen.  There is a small calcification overlying the right temporal lobe which may reflect a vascular calcification of indeterminate age. Skull: Normal. Negative for fracture or focal lesion. Sinuses/Orbits: The paranasal sinuses are clear. The globes and orbits are unremarkable. Other: None. ASPECTS [facility] Stroke Program Early CT Score) - Ganglionic level infarction (caudate, lentiform nuclei, internal capsule, insula, M1-M3 cortex): 7 - Supraganglionic infarction (M4-M6 cortex): 3 Total score (0-10 with 10 being normal): 10 CTA NECK FINDINGS Aortic arch: There is calcified plaque in the imaged aortic arch. The origins of the major branch vessels are patent. The subclavian arteries are patent to the level imaged. Right carotid system: The right common, internal, and external carotid arteries are patent, without hemodynamically significant stenosis or occlusion. There is no evidence of dissection or aneurysm. Left carotid system: The left common, internal, and external carotid arteries are patent, with minimal plaque of the bifurcation but no significant stenosis or occlusion. There is no evidence of dissection or aneurysm. Vertebral arteries: The vertebral arteries are patent, without hemodynamically significant stenosis or occlusion. There is no evidence of dissection or aneurysm. Skeleton: There is no acute osseous abnormality or suspicious osseous lesion. There is no visible canal hematoma. Other neck: The soft tissues of the neck are unremarkable. Upper chest: Assessed on the separately dictated CTA chest. Review of the MIP images confirms the above findings CTA HEAD FINDINGS Anterior circulation: The intracranial ICAs are patent, with minimal plaque but no hemodynamically significant stenosis or occlusion. The bilateral MCAs are patent, without proximal stenosis or occlusion. The bilateral ACAs are patent, without proximal stenosis or occlusion. There is focal calcification and a right M3 branch without occlusion  (6-167). The anterior communicating artery is normal. There is no aneurysm or AVM. Posterior circulation: The bilateral V4 segments are patent. The  basilar artery is patent. The major cerebellar arteries appear patent. The bilateral PCAs are patent, without proximal stenosis or occlusion. The right PCA is primarily supplied by the posterior communicating artery with a diminutive P1 segment (fetal origin). A small left posterior communicating artery is also identified. There is no aneurysm or AVM. Venous sinuses: Not well assessed due to bolus timing. Anatomic variants: As above. Review of the MIP images confirms the above findings IMPRESSION: 1. No acute intracranial pathology. 2. Patent vasculature of the head and neck with no hemodynamically significant stenosis or occlusion. Findings communicated to Dr Amada Jupiter via Loretha Stapler at 8:40am. The CTA results were communicated at 8:54 am. Electronically Signed   By: Lesia Hausen M.D.   On: 06/15/2022 09:20   CT ANGIO HEAD NECK W WO CM (CODE STROKE)  Result Date: 06/15/2022 CLINICAL DATA:  Code stroke.  Left arm weakness and slurred speech. EXAM: CT ANGIOGRAPHY HEAD AND NECK TECHNIQUE: Multidetector CT imaging of the head and neck was performed using the standard protocol during bolus administration of intravenous contrast. Multiplanar CT image reconstructions and MIPs were obtained to evaluate the vascular anatomy. Carotid stenosis measurements (when applicable) are obtained utilizing NASCET criteria, using the distal internal carotid diameter as the denominator. RADIATION DOSE REDUCTION: This exam was performed according to the departmental dose-optimization program which includes automated exposure control, adjustment of the mA and/or kV according to patient size and/or use of iterative reconstruction technique. CONTRAST:  OMNIPAQUE IOHEXOL 350 MG/ML SOLN COMPARISON:  None Available. FINDINGS: CT HEAD FINDINGS Brain: There is no acute intracranial hemorrhage,  extra-axial fluid collection, or acute infarct. Parenchymal volume is normal. The ventricles are normal in size. Forester-white differentiation is preserved. The pituitary and suprasellar region are normal. There is no mass lesion. There is no mass effect or midline shift. Vascular: No hyperdense vessel is seen. There is a small calcification overlying the right temporal lobe which may reflect a vascular calcification of indeterminate age. Skull: Normal. Negative for fracture or focal lesion. Sinuses/Orbits: The paranasal sinuses are clear. The globes and orbits are unremarkable. Other: None. ASPECTS [facility] Stroke Program Early CT Score) - Ganglionic level infarction (caudate, lentiform nuclei, internal capsule, insula, M1-M3 cortex): 7 - Supraganglionic infarction (M4-M6 cortex): 3 Total score (0-10 with 10 being normal): 10 CTA NECK FINDINGS Aortic arch: There is calcified plaque in the imaged aortic arch. The origins of the major branch vessels are patent. The subclavian arteries are patent to the level imaged. Right carotid system: The right common, internal, and external carotid arteries are patent, without hemodynamically significant stenosis or occlusion. There is no evidence of dissection or aneurysm. Left carotid system: The left common, internal, and external carotid arteries are patent, with minimal plaque of the bifurcation but no significant stenosis or occlusion. There is no evidence of dissection or aneurysm. Vertebral arteries: The vertebral arteries are patent, without hemodynamically significant stenosis or occlusion. There is no evidence of dissection or aneurysm. Skeleton: There is no acute osseous abnormality or suspicious osseous lesion. There is no visible canal hematoma. Other neck: The soft tissues of the neck are unremarkable. Upper chest: Assessed on the separately dictated CTA chest. Review of the MIP images confirms the above findings CTA HEAD FINDINGS Anterior circulation: The  intracranial ICAs are patent, with minimal plaque but no hemodynamically significant stenosis or occlusion. The bilateral MCAs are patent, without proximal stenosis or occlusion. The bilateral ACAs are patent, without proximal stenosis or occlusion. There is focal calcification and a right M3 branch  without occlusion (6-167). The anterior communicating artery is normal. There is no aneurysm or AVM. Posterior circulation: The bilateral V4 segments are patent. The basilar artery is patent. The major cerebellar arteries appear patent. The bilateral PCAs are patent, without proximal stenosis or occlusion. The right PCA is primarily supplied by the posterior communicating artery with a diminutive P1 segment (fetal origin). A small left posterior communicating artery is also identified. There is no aneurysm or AVM. Venous sinuses: Not well assessed due to bolus timing. Anatomic variants: As above. Review of the MIP images confirms the above findings IMPRESSION: 1. No acute intracranial pathology. 2. Patent vasculature of the head and neck with no hemodynamically significant stenosis or occlusion. Findings communicated to Dr Amada Jupiter via Loretha Stapler at 8:40am. The CTA results were communicated at 8:54 am. Electronically Signed   By: Lesia Hausen M.D.   On: 06/15/2022 09:20     Assessment and Plan:   Left UE ischemia with acute occlusion of high lt prox brachial artery at level of Left humeral neck.  (His PAF managed with eliquis- recent change from coumadin though INR still 2.0 on admit)  recent cardiac cath with failed Lt radial approach and Rt groin use. Last dose of eiliquis was 06/14/22 pm. S/p urgent lt upper extremity thromboembolectomy via brachial and radial artery approach. Left upper extremity angiogram with catheter selection of left subclavian and axillary arteries today CAD with hx 2V CABG in 2020 and patent grafts 05/23/22 by cath.  Hx of severe AS and MS with AVR/MVR with bioprosthetic now with trivial MR  and moderate MV prosthetic stenosis with gradient of 11 plans for TEE to eval further Moderate to severe RV enlargement and Severe TR  Appears functional and secondary to RV dilation leading to poor coaptation followed by AHF Dr. Gasper Lloyd with plan for TEE and dig was added.  Dig level 0.5 Heart block post CABG and valve surgery with ppm - last evaluated 4/624 and on 06/09/22 minimal AF/AT, 92.8% v pacing now V pacing and atrially sensing. PAFl, BB increased on 06/09/22 to 37.5 BID on eliquis ? Continue or return to coumadin.   Risk Assessment/Risk Scores:                For questions or updates, please contact [facility] Please consult [email or web address] for contact info under    Signed, Nada Boozer, NP  06/15/2022 3:27 PM  Patient seen and examined and agree with Nada Boozer, NP as detailed above.   In brief, the patient is a 72 y.o. male with a hx of CAD with CABG X 2 vessel and L atrial clipping, + foramen ovale closure, s/p aortic and MVR bioprosthetic and PPM, atrial flutter, tobacco abuse, HLD, severe TR with RV dysfunction who presented to the ER with a cold left arm with lack of a pulse found to have acute occlusion of the high left brachial artery now s/p thromboembolectomy. Cardiology is now consulted for further evaluation for possible cardiac source of embolus.  The patient has known complex cardiac history as detailed above. Presented on this admission with left arm numbness and heaviness found to have acute occlusion of the brachial artery as detailed above. Notably, had cath in 05/2022 but left radial access failed and right groin was used for the procedure. Was also recently switched from warfarin to apixaban for [facility], but he has been compliant with [facility] since discharge with no skipped doses. Given concern for cardiac source of embolus in the setting of known Afib  with recent change in [facility], AVR, and MVR with moderate MS, will plan for TEE to assess for cardiac source of  embolus. May ultimately need warfarin given degree of MS.  GEN: No acute distress.   Neck: No JVD Cardiac: RRR, 2/6 systolic murmur Respiratory: Clear to auscultation bilaterally. GI: Soft, nontender, non-distended  MS: Left forearm mildly swollen with palpable radial pulse Neuro:  Nonfocal  Psych: Normal affect    Plan: -Check TEE tomorrow for cardiac source of embolus -Continue heparin gtt for [facility]; may merit warfarin long-term pending degree of MS -Continue home liptior 20mg  daily -Continue home digoxin 0.125mg  daily -Continue home jardiance 10mg  daily -Continue home lasix 20mg  PO BID -Continue home metop 37.5mg  BID  Laurance Flatten, MD

## 2022-06-15 NOTE — ED Provider Notes (Signed)
[place] EMERGENCY DEPARTMENT AT [facility] Provider Note   CSN: [id number] Arrival date & time: 06/15/22  1478  An emergency department physician performed an initial assessment on this suspected stroke patient at 0825.  History  No chief complaint on file.   NAVID FUDGE is a 72 y.o. male.  Pt c/o left arm pain/heaviness with limited use of arm this AM. Symptoms acute onset, moderate, persistent. Notes use of arm/hand has improved since symptom onset. Family member notes arm felt cold. Denies any new leg numbness/weakness. No change in speech or vision. Hx valvular heart disease, no eliquis.   The history is provided by the patient, medical records, a relative and the EMS personnel.       Home Medications Prior to Admission medications   Medication Sig Start Date End Date Taking? Authorizing Provider  acetaminophen (TYLENOL) 325 MG tablet Take 650 mg by mouth as needed for moderate pain.    [provider]  apixaban (ELIQUIS) 5 MG TABS tablet Take 1 tablet (5 mg total) by mouth 2 (two) times daily. 05/26/22   Kathlen Mody, MD  atorvastatin (LIPITOR) 20 MG tablet Take 1 tablet (20 mg total) by mouth daily. 05/18/22   [name], MD  digoxin (LANOXIN) 0.125 MG tablet Take 1 tablet (0.125 mg total) by mouth daily. 06/03/22   Sabharwal, Aditya, DO  empagliflozin (JARDIANCE) 10 MG TABS tablet Take 1 tablet (10 mg total) by mouth daily before breakfast. 06/03/22   Sabharwal, Aditya, DO  furosemide (LASIX) 20 MG tablet Take 20 mg by mouth daily.    [provider]  Iron, Ferrous Sulfate, 325 (65 Fe) MG TABS Take 1 tablet by mouth 2 (two) times daily. Patient taking differently: Take 1 tablet by mouth daily. 05/26/22   Kathlen Mody, MD  metoprolol tartrate (LOPRESSOR) 25 MG tablet Take 1.5 tablets (37.5 mg total) by mouth 2 (two) times daily. 06/09/22   [place], Anderson Malta, FNP  oxymetazoline (AFRIN) 0.05 % nasal spray Place 1 spray into both nostrils at  bedtime as needed for congestion.    [provider]  polyethylene glycol powder (GLYCOLAX/MIRALAX) 17 GM/SCOOP powder Take 17 g by mouth daily as needed for mild constipation. 05/26/22   Kathlen Mody, MD      Allergies    Patient has no known allergies.    Review of Systems   Review of Systems  Constitutional:  Negative for fever.  HENT:  Negative for sore throat.   Eyes:  Negative for visual disturbance.  Respiratory:  Negative for shortness of breath.   Cardiovascular:  Negative for chest pain.  Gastrointestinal:  Negative for abdominal pain, nausea and vomiting.  Genitourinary:  Negative for flank pain.  Musculoskeletal:  Negative for back pain and neck pain.  Skin:  Negative for rash.  Neurological:  Negative for headaches.  Psychiatric/Behavioral:  Negative for confusion.     Physical Exam Updated Vital Signs BP 93/73   Pulse (!) 114   Temp 99.2 F (37.3 C)   Resp 19   Ht 1.702 m (5\' 7" )   Wt 94.8 kg   SpO2 100%   BMI 32.73 kg/m  Physical Exam Vitals and nursing note reviewed.  Constitutional:      Appearance: Normal appearance. He is well-developed.  HENT:     Head: Atraumatic.     Nose: Nose normal.     Mouth/Throat:     Mouth: Mucous membranes are moist.     Pharynx: Oropharynx is clear.  Eyes:     General: No scleral icterus.    Conjunctiva/sclera: Conjunctivae normal.     Pupils: Pupils are equal, round, and reactive to light.  Neck:     Trachea: No tracheal deviation.  Cardiovascular:     Rate and Rhythm: Normal rate and regular rhythm.     Pulses: Normal pulses.     Heart sounds: Normal heart sounds. No murmur heard.    No friction rub. No gallop.  Pulmonary:     Effort: Pulmonary effort is normal. No accessory muscle usage or respiratory distress.     Breath sounds: Normal breath sounds.  Abdominal:     General: There is no distension.     Palpations: Abdomen is soft.     Tenderness: There is no abdominal tenderness.   Musculoskeletal:        General: No swelling.     Cervical back: Normal range of motion and neck supple. No rigidity.     Comments: LUE/hand is much cooler than RUE/hand. Unable to palpate radial pulse. No gross swelling noted.   Skin:    General: Skin is warm and dry.     Findings: No rash.  Neurological:     Mental Status: He is alert.     Comments: Alert, speech clear. Motor/sens grossly intact bil.   Psychiatric:        Mood and Affect: Mood normal.     ED Results / Procedures / Treatments   Labs (all labs ordered are listed, but only abnormal results are displayed) Results for orders placed or performed during the hospital encounter of 06/15/22  Comprehensive metabolic panel  Result Value Ref Range   Sodium 134 (L) 135 - 145 mmol/L   Potassium 3.3 (L) 3.5 - 5.1 mmol/L   Chloride 106 98 - 111 mmol/L   CO2 14 (L) 22 - 32 mmol/L   Glucose, Bld 165 (H) 70 - 99 mg/dL   BUN 14 8 - 23 mg/dL   Creatinine, Ser 3.08 0.61 - 1.24 mg/dL   Calcium 7.7 (L) 8.9 - 10.3 mg/dL   Total Protein 5.9 (L) 6.5 - 8.1 g/dL   Albumin 2.3 (L) 3.5 - 5.0 g/dL   AST 26 15 - 41 U/L   ALT 12 0 - 44 U/L   Alkaline Phosphatase 73 38 - 126 U/L   Total Bilirubin 2.0 (H) 0.3 - 1.2 mg/dL   GFR, Estimated >65 >78 mL/min   Anion gap 14 5 - 15  Ethanol  Result Value Ref Range   Alcohol, Ethyl (B) <10 <10 mg/dL  CBC  Result Value Ref Range   WBC 18.1 (H) 4.0 - 10.5 K/uL   RBC 4.16 (L) 4.22 - 5.81 MIL/uL   Hemoglobin 10.4 (L) 13.0 - 17.0 g/dL   HCT 46.9 (L) 62.9 - 52.8 %   MCV 83.7 80.0 - 100.0 fL   MCH 25.0 (L) 26.0 - 34.0 pg   MCHC 29.9 (L) 30.0 - 36.0 g/dL   RDW 41.3 (H) 24.4 - 01.0 %   Platelets 79 (L) 150 - 400 K/uL   nRBC 0.0 0.0 - 0.2 %  Protime-INR  Result Value Ref Range   Prothrombin Time 22.8 (H) 11.4 - 15.2 seconds   INR 2.0 (H) 0.8 - 1.2  APTT  Result Value Ref Range   aPTT 43 (H) 24 - 36 seconds  Differential  Result Value Ref Range   Neutrophils Relative % 90 %   Neutro Abs  16.3 (H) 1.7 - 7.7  K/uL   Lymphocytes Relative 3 %   Lymphs Abs 0.5 (L) 0.7 - 4.0 K/uL   Monocytes Relative 5 %   Monocytes Absolute 1.0 0.1 - 1.0 K/uL   Eosinophils Relative 0 %   Eosinophils Absolute 0.0 0.0 - 0.5 K/uL   Basophils Relative 0 %   Basophils Absolute 0.1 0.0 - 0.1 K/uL   Immature Granulocytes 2 %   Abs Immature Granulocytes 0.29 (H) 0.00 - 0.07 K/uL  I-stat chem 8, ED  Result Value Ref Range   Sodium 135 135 - 145 mmol/L   Potassium 3.5 3.5 - 5.1 mmol/L   Chloride 106 98 - 111 mmol/L   BUN 16 8 - 23 mg/dL   Creatinine, Ser 9.60 0.61 - 1.24 mg/dL   Glucose, Bld 454 (H) 70 - 99 mg/dL   Calcium, Ion 0.98 (L) 1.15 - 1.40 mmol/L   TCO2 16 (L) 22 - 32 mmol/L   Hemoglobin 11.9 (L) 13.0 - 17.0 g/dL   HCT 11.9 (L) 14.7 - 82.9 %  CBG monitoring, ED  Result Value Ref Range   Glucose-Capillary 159 (H) 70 - 99 mg/dL   Comment 1 Notify RN    Comment 2 Document in Chart    CT HEAD CODE STROKE WO CONTRAST  Result Date: 06/15/2022 CLINICAL DATA:  Code stroke.  Left arm weakness and slurred speech. EXAM: CT ANGIOGRAPHY HEAD AND NECK TECHNIQUE: Multidetector CT imaging of the head and neck was performed using the standard protocol during bolus administration of intravenous contrast. Multiplanar CT image reconstructions and MIPs were obtained to evaluate the vascular anatomy. Carotid stenosis measurements (when applicable) are obtained utilizing NASCET criteria, using the distal internal carotid diameter as the denominator. RADIATION DOSE REDUCTION: This exam was performed according to the departmental dose-optimization program which includes automated exposure control, adjustment of the mA and/or kV according to patient size and/or use of iterative reconstruction technique. CONTRAST:  OMNIPAQUE IOHEXOL 350 MG/ML SOLN COMPARISON:  None Available. FINDINGS: CT HEAD FINDINGS Brain: There is no acute intracranial hemorrhage, extra-axial fluid collection, or acute infarct. Parenchymal  volume is normal. The ventricles are normal in size. Brienza-white differentiation is preserved. The pituitary and suprasellar region are normal. There is no mass lesion. There is no mass effect or midline shift. Vascular: No hyperdense vessel is seen. There is a small calcification overlying the right temporal lobe which may reflect a vascular calcification of indeterminate age. Skull: Normal. Negative for fracture or focal lesion. Sinuses/Orbits: The paranasal sinuses are clear. The globes and orbits are unremarkable. Other: None. ASPECTS [facility] Stroke Program Early CT Score) - Ganglionic level infarction (caudate, lentiform nuclei, internal capsule, insula, M1-M3 cortex): 7 - Supraganglionic infarction (M4-M6 cortex): 3 Total score (0-10 with 10 being normal): 10 CTA NECK FINDINGS Aortic arch: There is calcified plaque in the imaged aortic arch. The origins of the major branch vessels are patent. The subclavian arteries are patent to the level imaged. Right carotid system: The right common, internal, and external carotid arteries are patent, without hemodynamically significant stenosis or occlusion. There is no evidence of dissection or aneurysm. Left carotid system: The left common, internal, and external carotid arteries are patent, with minimal plaque of the bifurcation but no significant stenosis or occlusion. There is no evidence of dissection or aneurysm. Vertebral arteries: The vertebral arteries are patent, without hemodynamically significant stenosis or occlusion. There is no evidence of dissection or aneurysm. Skeleton: There is no acute osseous abnormality or suspicious osseous lesion. There is no visible  canal hematoma. Other neck: The soft tissues of the neck are unremarkable. Upper chest: Assessed on the separately dictated CTA chest. Review of the MIP images confirms the above findings CTA HEAD FINDINGS Anterior circulation: The intracranial ICAs are patent, with minimal plaque but no  hemodynamically significant stenosis or occlusion. The bilateral MCAs are patent, without proximal stenosis or occlusion. The bilateral ACAs are patent, without proximal stenosis or occlusion. There is focal calcification and a right M3 branch without occlusion (6-167). The anterior communicating artery is normal. There is no aneurysm or AVM. Posterior circulation: The bilateral V4 segments are patent. The basilar artery is patent. The major cerebellar arteries appear patent. The bilateral PCAs are patent, without proximal stenosis or occlusion. The right PCA is primarily supplied by the posterior communicating artery with a diminutive P1 segment (fetal origin). A small left posterior communicating artery is also identified. There is no aneurysm or AVM. Venous sinuses: Not well assessed due to bolus timing. Anatomic variants: As above. Review of the MIP images confirms the above findings IMPRESSION: 1. No acute intracranial pathology. 2. Patent vasculature of the head and neck with no hemodynamically significant stenosis or occlusion. Findings communicated to Dr Amada Jupiter via Loretha Stapler at 8:40am. The CTA results were communicated at 8:54 am. Electronically Signed   By: Lesia Hausen M.D.   On: 06/15/2022 09:20   CT ANGIO HEAD NECK W WO CM (CODE STROKE)  Result Date: 06/15/2022 CLINICAL DATA:  Code stroke.  Left arm weakness and slurred speech. EXAM: CT ANGIOGRAPHY HEAD AND NECK TECHNIQUE: Multidetector CT imaging of the head and neck was performed using the standard protocol during bolus administration of intravenous contrast. Multiplanar CT image reconstructions and MIPs were obtained to evaluate the vascular anatomy. Carotid stenosis measurements (when applicable) are obtained utilizing NASCET criteria, using the distal internal carotid diameter as the denominator. RADIATION DOSE REDUCTION: This exam was performed according to the departmental dose-optimization program which includes automated exposure control,  adjustment of the mA and/or kV according to patient size and/or use of iterative reconstruction technique. CONTRAST:  OMNIPAQUE IOHEXOL 350 MG/ML SOLN COMPARISON:  None Available. FINDINGS: CT HEAD FINDINGS Brain: There is no acute intracranial hemorrhage, extra-axial fluid collection, or acute infarct. Parenchymal volume is normal. The ventricles are normal in size. Billard-white differentiation is preserved. The pituitary and suprasellar region are normal. There is no mass lesion. There is no mass effect or midline shift. Vascular: No hyperdense vessel is seen. There is a small calcification overlying the right temporal lobe which may reflect a vascular calcification of indeterminate age. Skull: Normal. Negative for fracture or focal lesion. Sinuses/Orbits: The paranasal sinuses are clear. The globes and orbits are unremarkable. Other: None. ASPECTS [facility] Stroke Program Early CT Score) - Ganglionic level infarction (caudate, lentiform nuclei, internal capsule, insula, M1-M3 cortex): 7 - Supraganglionic infarction (M4-M6 cortex): 3 Total score (0-10 with 10 being normal): 10 CTA NECK FINDINGS Aortic arch: There is calcified plaque in the imaged aortic arch. The origins of the major branch vessels are patent. The subclavian arteries are patent to the level imaged. Right carotid system: The right common, internal, and external carotid arteries are patent, without hemodynamically significant stenosis or occlusion. There is no evidence of dissection or aneurysm. Left carotid system: The left common, internal, and external carotid arteries are patent, with minimal plaque of the bifurcation but no significant stenosis or occlusion. There is no evidence of dissection or aneurysm. Vertebral arteries: The vertebral arteries are patent, without hemodynamically significant stenosis or  occlusion. There is no evidence of dissection or aneurysm. Skeleton: There is no acute osseous abnormality or suspicious osseous lesion.  There is no visible canal hematoma. Other neck: The soft tissues of the neck are unremarkable. Upper chest: Assessed on the separately dictated CTA chest. Review of the MIP images confirms the above findings CTA HEAD FINDINGS Anterior circulation: The intracranial ICAs are patent, with minimal plaque but no hemodynamically significant stenosis or occlusion. The bilateral MCAs are patent, without proximal stenosis or occlusion. The bilateral ACAs are patent, without proximal stenosis or occlusion. There is focal calcification and a right M3 branch without occlusion (6-167). The anterior communicating artery is normal. There is no aneurysm or AVM. Posterior circulation: The bilateral V4 segments are patent. The basilar artery is patent. The major cerebellar arteries appear patent. The bilateral PCAs are patent, without proximal stenosis or occlusion. The right PCA is primarily supplied by the posterior communicating artery with a diminutive P1 segment (fetal origin). A small left posterior communicating artery is also identified. There is no aneurysm or AVM. Venous sinuses: Not well assessed due to bolus timing. Anatomic variants: As above. Review of the MIP images confirms the above findings IMPRESSION: 1. No acute intracranial pathology. 2. Patent vasculature of the head and neck with no hemodynamically significant stenosis or occlusion. Findings communicated to Dr Amada Jupiter via Loretha Stapler at 8:40am. The CTA results were communicated at 8:54 am. Electronically Signed   By: Lesia Hausen M.D.   On: 06/15/2022 09:20   CARDIAC CATHETERIZATION  Result Date: 05/25/2022   Prox LAD lesion is 40% stenosed.   Mid LAD lesion is 95% stenosed.   Prox Cx to Mid Cx lesion is 30% stenosed.   1st Mrg lesion is 30% stenosed.   3rd Mrg lesion is 30% stenosed.   Prox RCA lesion is 100% stenosed.   SVG graft was visualized by angiography and is normal in caliber.   LIMA graft was visualized by angiography and is normal in caliber.  Severe mid LAD stenosis. Competitive filling is seen distally from the patent LIMA graft. Patent LIMA graft to the mid LAD. Mild non-obstructive disease in the Circumflex artery. Large dominant RCA with chronic proximal occlusion. The mid and distal RCA fills from the patent vein graft. Near normal right and left heart pressures. CO 6.92 L/min CI 3.33 Recommendations: His symptoms are likely related to his severe TR. He does not appear to need further diuresis. Medical management of CAD. Consider Advanced Heart Failure team consult.   CT KNEE LEFT W CONTRAST  Result Date: 05/24/2022 CLINICAL DATA:  Left leg pain and weakness. Had a knee aspiration several weeks ago. Possible pseudoaneurysm seen in the popliteal fossa on DVT study. EXAM: CT OF THE LEFT KNEE WITH CONTRAST TECHNIQUE: Multidetector CT imaging was performed following the standard protocol during bolus administration of intravenous contrast. RADIATION DOSE REDUCTION: This exam was performed according to the departmental dose-optimization program which includes automated exposure control, adjustment of the mA and/or kV according to patient size and/or use of iterative reconstruction technique. CONTRAST:  OMNIPAQUE IOHEXOL 350 MG/ML SOLN COMPARISON:  None Available. FINDINGS: Bones/Joint/Cartilage No fracture or dislocation. Moderate to severe medial compartment joint space narrowing with near bone-on-bone apposition medially. The patellofemoral and lateral compartment joint spaces are relatively preserved. Small to moderate joint effusion. Fluid extends into the popliteus tendon sheath. No Baker cyst. Ligaments Ligaments are suboptimally evaluated by CT. Muscles and Tendons Grossly intact.  No significant muscle atrophy. Soft tissue 2.3 x 1.9 x  2.1 cm cystic lesion with rim enhancement posterior to the lateral femoral condyle and lateral to the popliteal vessels, likely a ganglion cyst. No pseudoaneurysm. No worrisome soft tissue mass.  IMPRESSION: 1. Moderate to severe medial compartment osteoarthritis with small to moderate joint effusion. 2. 2.3 cm ganglion cyst posterior to the lateral femoral condyle and lateral to the popliteal vessels. No pseudoaneurysm or worrisome soft tissue mass. Electronically Signed   By: Obie Dredge M.D.   On: 05/24/2022 09:18   VAS [place] LOWER EXTREMITY VENOUS (DVT) (7a-7p)  Result Date: 05/24/2022  Lower Venous DVT Study Patient Name:  WILFRED CLEMMER  Date of Exam:   05/23/2022 Medical Rec #: [id number]       Accession #:    [id number] Date of Birth: May 21, 1950       Patient Gender: M Patient Age:   10 years Exam Location:  [facility] Procedure:      VAS [place] LOWER EXTREMITY VENOUS (DVT) Referring Phys: Harlene Salts --------------------------------------------------------------------------------  Indications: Left leg pain with walking. Other Indications: Patient had fluid drained off knee 05/05/22. Comparison Study: No prior study on file Performing Technologist: Sherren Kerns RVS  Examination Guidelines: A complete evaluation includes B-mode imaging, spectral Doppler, color Doppler, and power Doppler as needed of all accessible portions of each vessel. Bilateral testing is considered an integral part of a complete examination. Limited examinations for reoccurring indications may be performed as noted. The reflux portion of the exam is performed with the patient in reverse Trendelenburg.  +---------+---------------+---------+-----------+----------+--------------+ RIGHT    CompressibilityPhasicitySpontaneityPropertiesThrombus Aging +---------+---------------+---------+-----------+----------+--------------+ CFV      Full           Yes      Yes                                 +---------+---------------+---------+-----------+----------+--------------+ SFJ      Full                                                         +---------+---------------+---------+-----------+----------+--------------+ FV Prox  Full                                                        +---------+---------------+---------+-----------+----------+--------------+ FV Mid   Full           Yes      Yes                                 +---------+---------------+---------+-----------+----------+--------------+ FV DistalFull                                                        +---------+---------------+---------+-----------+----------+--------------+ PFV      Full                                                        +---------+---------------+---------+-----------+----------+--------------+  POP      Full           Yes      Yes                                 +---------+---------------+---------+-----------+----------+--------------+ PTV      Full                                                        +---------+---------------+---------+-----------+----------+--------------+ PERO     Full                                                        +---------+---------------+---------+-----------+----------+--------------+   +---------+---------------+---------+-----------+----------+--------------+ LEFT     CompressibilityPhasicitySpontaneityPropertiesThrombus Aging +---------+---------------+---------+-----------+----------+--------------+ CFV      Full           Yes      Yes                                 +---------+---------------+---------+-----------+----------+--------------+ SFJ      Full                                                        +---------+---------------+---------+-----------+----------+--------------+ FV Prox  Full                                                        +---------+---------------+---------+-----------+----------+--------------+ FV Mid   Full           Yes      Yes                                  +---------+---------------+---------+-----------+----------+--------------+ FV DistalFull                                                        +---------+---------------+---------+-----------+----------+--------------+ PFV      Full                                                        +---------+---------------+---------+-----------+----------+--------------+ POP      Full           Yes      Yes                                 +---------+---------------+---------+-----------+----------+--------------+  PTV      Full                                                        +---------+---------------+---------+-----------+----------+--------------+ PERO     Full                                                        +---------+---------------+---------+-----------+----------+--------------+     Summary: RIGHT: - There is no evidence of deep vein thrombosis in the lower extremity.  - No cystic structure found in the popliteal fossa.  LEFT: - There is no evidence of deep vein thrombosis in the lower extremity.  - There appears to be, what may be consistent with, a partially thrombosed pseudoaneurysm in the popliteal fossa/proximal calf area adjacent to an effusion filled with mixed echoes. Further imaging should be considered to differentiate/classify findings.  *See table(s) above for measurements and observations. Electronically signed by Coral Else MD on 05/24/2022 at 8:05:33 AM.    Final    ECHOCARDIOGRAM COMPLETE  Result Date: 05/23/2022    ECHOCARDIOGRAM REPORT   Patient Name:   [name] Date of Exam: 05/23/2022 Medical Rec #:  [id number]      Height:       67.0 in Accession #:    [id number]     Weight:       223.0 lb Date of Birth:  04-10-1950      BSA:          2.118 m Patient Age:    71 years       BP:           106/76 mmHg Patient Gender: M              HR:           96 bpm. Exam Location:  Inpatient Procedure: 2D Echo, Cardiac Doppler, Color Doppler and  Intracardiac            Opacification Agent Indications:    Dyspnea  History:        Patient has prior history of Echocardiogram examinations, most                 recent 06/13/2019. Aortic Valve Disease and Mitral Valve Disease.                 23mm Edwards Inspiris Resilia AVR, 27mm Bovine AVR.  Sonographer:    Milbert Coulter Referring Phys: [id number] Cecille Po MELVIN  Sonographer Comments: No subcostal window. Image acquisition challenging due to patient body habitus. IMPRESSIONS  1. Right ventricular systolic function is moderately reduced. The right ventricular size is severely enlarged.  2. Left ventricular ejection fraction, by estimation, is 55 to 60%. The left ventricle has normal function. The left ventricle has no regional wall motion abnormalities. Left ventricular diastolic parameters are indeterminate. There is the interventricular septum is flattened in systole, consistent with right ventricular pressure overload and the interventricular septum is flattened in diastole ('D' shaped left ventricle), consistent with right ventricular volume overload.  3. The mitral valve has been repaired/replaced. Trivial mitral valve regurgitation. 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  4. Tricuspid valve regurgitation is severe.  5. The aortic valve has been repaired/replaced. Aortic valve regurgitation is not visualized. Echo findings are consistent with normal structure and function of the aortic valve prosthesis. Aortic valve mean gradient measures 7.0 mmHg. Aortic valve Vmax  measures 1.78 m/s. DI 0.5.  6. Left atrial size was severely dilated.  7. Right atrial size was severely dilated. Comparison(s): Compared to prior TTE in 06/2019, the RV is now severely dilated and moderately hypokinetic with severe TR. FINDINGS  Left Ventricle: Left ventricular ejection fraction, by estimation, is 55 to 60%. The left ventricle has normal function. The left ventricle  has no regional wall motion abnormalities. Definity contrast agent was given IV to delineate the left ventricular  endocardial borders. The left ventricular internal cavity size was small. There is no left ventricular hypertrophy. The interventricular septum is flattened in systole, consistent with right ventricular pressure overload and the interventricular septum is flattened in diastole ('D' shaped left ventricle), consistent with right ventricular volume overload. Left ventricular diastolic parameters are indeterminate. Right Ventricle: The right ventricular size is severely enlarged. Right vetricular wall thickness was not well visualized. Right ventricular systolic function is moderately reduced. Left Atrium: Left atrial size was severely dilated. Right Atrium: Right atrial size was severely dilated. Pericardium: There is no evidence of pericardial effusion. Mitral Valve: There is at least moderate prosthetic valve stenosis with mean gradient at HR 95bpm. The mitral valve has been repaired/replaced. Trivial mitral valve regurgitation. There is a 27 mm [employer]tr[employer] bioprosthetic valve present in the mitral position. MV peak gradient, 16.8 mmHg. The mean mitral valve gradient is 11.0 mmHg. Tricuspid Valve: The tricuspid valve is grossly normal. Tricuspid valve regurgitation is severe. Aortic Valve: DI 0.51. The aortic valve has been repaired/replaced. Aortic valve regurgitation is not visualized. Aortic valve mean gradient measures 7.0 mmHg. Aortic valve peak gradient measures 12.7 mmHg. There is a 23 mm bioprosthetic valve present in  the aortic position. Echo findings are consistent with normal structure and function of the aortic valve prosthesis. Pulmonic Valve: The pulmonic valve was normal in structure. Pulmonic valve regurgitation is trivial. Aorta: The aortic root is normal in size and structure. IAS/Shunts: The atrial septum is grossly normal. Additional Comments: A device lead is visualized.  LEFT  VENTRICLE PLAX 2D LVIDd:         3.90 cm LVIDs:         3.20 cm LV PW:         1.10 cm LV IVS:        1.00 cm  RIGHT VENTRICLE RV S prime:     7.07 cm/s TAPSE (M-mode): 1.1 cm LEFT ATRIUM         Index LA diam:    5.70 cm 2.69 cm/m  AORTIC VALVE AV Vmax:           178.00 cm/s AV Vmean:          121.500 cm/s AV VTI:            0.292 m AV Peak Grad:      12.7 mmHg AV Mean Grad:      7.0 mmHg LVOT Vmax:         98.00 cm/s LVOT Vmean:        66.600 cm/s LVOT VTI:          0.158 m LVOT/AV VTI ratio: 0.54 MITRAL VALVE MV Peak grad: 16.8 mmHg  SHUNTS  MV Mean grad: 11.0 mmHg  Systemic VTI: 0.16 m MV Vmax:      2.05 m/s MV Vmean:     161.0 cm/s Laurance Flatten MD Electronically signed by Laurance Flatten MD Signature Date/Time: 05/23/2022/5:39:50 PM    Final    DG Chest Port 1 View  Result Date: 05/23/2022 CLINICAL DATA:  Dyspnea on exertion. EXAM: PORTABLE CHEST 1 VIEW COMPARISON:  May 29, 2019. FINDINGS: Stable cardiomediastinal silhouette. Lungs are clear. Left-sided pacemaker is unchanged. Sternotomy wires are noted. Bony thorax is unremarkable. IMPRESSION: No active disease. Electronically Signed   By: Lupita Raider M.D.   On: 05/23/2022 11:52    EKG EKG Interpretation  Date/Time:  Tuesday Jun 15 2022 09:02:21 EDT Ventricular Rate:  117 PR Interval:  96 QRS Duration: 154 QT Interval:  418 QTC Calculation: 584 R Axis:   -80 Text Interpretation: Electronic ventricular pacemaker Confirmed by Cathren Laine ([id number]) on 06/15/2022 9:06:08 AM  Radiology CT HEAD CODE STROKE WO CONTRAST  Result Date: 06/15/2022 CLINICAL DATA:  Code stroke.  Left arm weakness and slurred speech. EXAM: CT ANGIOGRAPHY HEAD AND NECK TECHNIQUE: Multidetector CT imaging of the head and neck was performed using the standard protocol during bolus administration of intravenous contrast. Multiplanar CT image reconstructions and MIPs were obtained to evaluate the vascular anatomy. Carotid stenosis measurements (when applicable) are  obtained utilizing NASCET criteria, using the distal internal carotid diameter as the denominator. RADIATION DOSE REDUCTION: This exam was performed according to the departmental dose-optimization program which includes automated exposure control, adjustment of the mA and/or kV according to patient size and/or use of iterative reconstruction technique. CONTRAST:  OMNIPAQUE IOHEXOL 350 MG/ML SOLN COMPARISON:  None Available. FINDINGS: CT HEAD FINDINGS Brain: There is no acute intracranial hemorrhage, extra-axial fluid collection, or acute infarct. Parenchymal volume is normal. The ventricles are normal in size. Crothers-white differentiation is preserved. The pituitary and suprasellar region are normal. There is no mass lesion. There is no mass effect or midline shift. Vascular: No hyperdense vessel is seen. There is a small calcification overlying the right temporal lobe which may reflect a vascular calcification of indeterminate age. Skull: Normal. Negative for fracture or focal lesion. Sinuses/Orbits: The paranasal sinuses are clear. The globes and orbits are unremarkable. Other: None. ASPECTS [facility] Stroke Program Early CT Score) - Ganglionic level infarction (caudate, lentiform nuclei, internal capsule, insula, M1-M3 cortex): 7 - Supraganglionic infarction (M4-M6 cortex): 3 Total score (0-10 with 10 being normal): 10 CTA NECK FINDINGS Aortic arch: There is calcified plaque in the imaged aortic arch. The origins of the major branch vessels are patent. The subclavian arteries are patent to the level imaged. Right carotid system: The right common, internal, and external carotid arteries are patent, without hemodynamically significant stenosis or occlusion. There is no evidence of dissection or aneurysm. Left carotid system: The left common, internal, and external carotid arteries are patent, with minimal plaque of the bifurcation but no significant stenosis or occlusion. There is no evidence of dissection or  aneurysm. Vertebral arteries: The vertebral arteries are patent, without hemodynamically significant stenosis or occlusion. There is no evidence of dissection or aneurysm. Skeleton: There is no acute osseous abnormality or suspicious osseous lesion. There is no visible canal hematoma. Other neck: The soft tissues of the neck are unremarkable. Upper chest: Assessed on the separately dictated CTA chest. Review of the MIP images confirms the above findings CTA HEAD FINDINGS Anterior circulation: The intracranial ICAs are patent, with minimal plaque but no hemodynamically significant  stenosis or occlusion. The bilateral MCAs are patent, without proximal stenosis or occlusion. The bilateral ACAs are patent, without proximal stenosis or occlusion. There is focal calcification and a right M3 branch without occlusion (6-167). The anterior communicating artery is normal. There is no aneurysm or AVM. Posterior circulation: The bilateral V4 segments are patent. The basilar artery is patent. The major cerebellar arteries appear patent. The bilateral PCAs are patent, without proximal stenosis or occlusion. The right PCA is primarily supplied by the posterior communicating artery with a diminutive P1 segment (fetal origin). A small left posterior communicating artery is also identified. There is no aneurysm or AVM. Venous sinuses: Not well assessed due to bolus timing. Anatomic variants: As above. Review of the MIP images confirms the above findings IMPRESSION: 1. No acute intracranial pathology. 2. Patent vasculature of the head and neck with no hemodynamically significant stenosis or occlusion. Findings communicated to Dr Amada Jupiter via Loretha Stapler at 8:40am. The CTA results were communicated at 8:54 am. Electronically Signed   By: Lesia Hausen M.D.   On: 06/15/2022 09:20   CT ANGIO HEAD NECK W WO CM (CODE STROKE)  Result Date: 06/15/2022 CLINICAL DATA:  Code stroke.  Left arm weakness and slurred speech. EXAM: CT ANGIOGRAPHY  HEAD AND NECK TECHNIQUE: Multidetector CT imaging of the head and neck was performed using the standard protocol during bolus administration of intravenous contrast. Multiplanar CT image reconstructions and MIPs were obtained to evaluate the vascular anatomy. Carotid stenosis measurements (when applicable) are obtained utilizing NASCET criteria, using the distal internal carotid diameter as the denominator. RADIATION DOSE REDUCTION: This exam was performed according to the departmental dose-optimization program which includes automated exposure control, adjustment of the mA and/or kV according to patient size and/or use of iterative reconstruction technique. CONTRAST:  OMNIPAQUE IOHEXOL 350 MG/ML SOLN COMPARISON:  None Available. FINDINGS: CT HEAD FINDINGS Brain: There is no acute intracranial hemorrhage, extra-axial fluid collection, or acute infarct. Parenchymal volume is normal. The ventricles are normal in size. Foxworthy-white differentiation is preserved. The pituitary and suprasellar region are normal. There is no mass lesion. There is no mass effect or midline shift. Vascular: No hyperdense vessel is seen. There is a small calcification overlying the right temporal lobe which may reflect a vascular calcification of indeterminate age. Skull: Normal. Negative for fracture or focal lesion. Sinuses/Orbits: The paranasal sinuses are clear. The globes and orbits are unremarkable. Other: None. ASPECTS [facility] Stroke Program Early CT Score) - Ganglionic level infarction (caudate, lentiform nuclei, internal capsule, insula, M1-M3 cortex): 7 - Supraganglionic infarction (M4-M6 cortex): 3 Total score (0-10 with 10 being normal): 10 CTA NECK FINDINGS Aortic arch: There is calcified plaque in the imaged aortic arch. The origins of the major branch vessels are patent. The subclavian arteries are patent to the level imaged. Right carotid system: The right common, internal, and external carotid arteries are patent,  without hemodynamically significant stenosis or occlusion. There is no evidence of dissection or aneurysm. Left carotid system: The left common, internal, and external carotid arteries are patent, with minimal plaque of the bifurcation but no significant stenosis or occlusion. There is no evidence of dissection or aneurysm. Vertebral arteries: The vertebral arteries are patent, without hemodynamically significant stenosis or occlusion. There is no evidence of dissection or aneurysm. Skeleton: There is no acute osseous abnormality or suspicious osseous lesion. There is no visible canal hematoma. Other neck: The soft tissues of the neck are unremarkable. Upper chest: Assessed on the separately dictated CTA chest. Review of  the MIP images confirms the above findings CTA HEAD FINDINGS Anterior circulation: The intracranial ICAs are patent, with minimal plaque but no hemodynamically significant stenosis or occlusion. The bilateral MCAs are patent, without proximal stenosis or occlusion. The bilateral ACAs are patent, without proximal stenosis or occlusion. There is focal calcification and a right M3 branch without occlusion (6-167). The anterior communicating artery is normal. There is no aneurysm or AVM. Posterior circulation: The bilateral V4 segments are patent. The basilar artery is patent. The major cerebellar arteries appear patent. The bilateral PCAs are patent, without proximal stenosis or occlusion. The right PCA is primarily supplied by the posterior communicating artery with a diminutive P1 segment (fetal origin). A small left posterior communicating artery is also identified. There is no aneurysm or AVM. Venous sinuses: Not well assessed due to bolus timing. Anatomic variants: As above. Review of the MIP images confirms the above findings IMPRESSION: 1. No acute intracranial pathology. 2. Patent vasculature of the head and neck with no hemodynamically significant stenosis or occlusion. Findings communicated  to Dr Amada Jupiter via Loretha Stapler at 8:40am. The CTA results were communicated at 8:54 am. Electronically Signed   By: Lesia Hausen M.D.   On: 06/15/2022 09:20    Procedures Procedures    Medications Ordered in ED Medications  sodium chloride flush (NS) 0.9 % injection 3 mL (has no administration in time range)  iohexol (OMNIPAQUE) 350 MG/ML injection 175 mL (175 mLs Intravenous Contrast Given 06/15/22 0907)    ED Course/ Medical Decision Making/ A&P                             Medical Decision Making Problems Addressed: Arterial occlusion: acute illness or injury with systemic symptoms that poses a threat to life or bodily functions Brachial artery occlusion, left (HCC): acute illness or injury with systemic symptoms that poses a threat to life or bodily functions  Amount and/or Complexity of Data Reviewed Independent Historian: EMS    Details: Ems/fam, hx External Data Reviewed: notes. Labs: ordered. Decision-making details documented in ED Course. Radiology: ordered and independent interpretation performed. Decision-making details documented in ED Course. ECG/medicine tests: ordered and independent interpretation performed. Decision-making details documented in ED Course. Discussion of management or test interpretation with external provider(s): Vascular surgery. neurology  Risk Prescription drug management. Decision regarding hospitalization.   Iv ns. Continuous pulse ox and cardiac monitoring. Labs ordered/sent. Imaging ordered.   Differential diagnosis includes PVD, emboli/arterial occlusion, etc. Dispo decision including potential need for admission considered - will get labs and imaging and reassess.   Reviewed nursing notes and prior charts for additional history. External reports reviewed. Additional history from: family/EMS.   Pt arrived to bridge as code stroke activation - neurology consulted.   Vascular surgery consulted - they will see.  Cardiac monitor: sinus  rhythm, rate 90.  Labs reviewed/interpreted by me - na, k normal.   CT reviewed/interpreted by me - no cva. Left brachial artery occlusion.  Plan to OR w vascular surgery.  CRITICAL CARE RE: acute left brachial artery occlusion, acute vascular insufficiency LUE Performed by: Suzi Roots Total critical care time: 45 minutes Critical care time was exclusive of separately billable procedures and treating other patients. Critical care was necessary to treat or prevent imminent or life-threatening deterioration. Critical care was time spent personally by me on the following activities: development of treatment plan with patient and/or surrogate as well as nursing, discussions with consultants, evaluation of  patient's response to treatment, examination of patient, obtaining history from patient or surrogate, ordering and performing treatments and interventions, ordering and review of laboratory studies, ordering and review of radiographic studies, pulse oximetry and re-evaluation of patient's condition.           Final Clinical Impression(s) / ED Diagnoses Final diagnoses:  Arterial occlusion  Brachial artery occlusion, left [facility])    Rx / DC Orders ED Discharge Orders     None         Cathren Laine, MD 06/15/22 1016

## 2022-06-15 NOTE — H&P (View-Only) (Signed)
 Cardiology Consultation   Patient ID: [name] MRN: [id number]; DOB: 12/09/1950  Admit date: 06/15/2022 Date of Consult: 06/15/2022  PCP:  [name], Michael C, MD   [name] Providers Cardiologist:  Michael McAlhany, MD        Patient Profile:   [name] is a 72 y.o. male with a hx of CAD with CABG X 2 vessel and L atrial clipping, + foramen ovale closure, s/p aortic and MVR bioprosthetic and PPM, atrial flutter, tobacco abuse, HLD who is being seen 06/15/2022 for the evaluation of acute thrombus to LUE s/p urgent surgery at the request of [name].  History of Present Illness:   [name] with hx of CAD s/p CABG 2020 (LIMA to LAD, and VG to RCA found on work up for AS and MS), had surgical bioprosthetic AVR and MVR, along with LAA clipping and PFO closure.  Post op had bradycardia and a flutter and underwent PPM- MDT dual chamber.  Last interrogated 05/15/22 with normal device function, 2 NSVT arrhythmias and an 85 second atrial arrhythmia detected.   On 05/23/22 pt presented with SOB fatigue and nausea, his troponin was 185 and BNP 312.  INR was 3.2 EKG with a pacing and V pacing.  His warfarin was held and TTE completed with EF 55-60%, RV was mod reduced.  RV severely enlarged.Aortic valve mean gradient measures 7.0 mmHg. Aortic valve peak gradient measures 12.7 mmHg. There is a 23 mm  bioprosthetic valve present in  the aortic position. Echo findings are consistent with normal structure and function of the aortic valve prosthesis.   at least moderate prosthetic valve stenosis with mean gradient 11mmHg at HR 95bpm. The mitral valve has been  repaired/replaced. Trivial mitral valve regurgitation. There is a 27 mm Medtronic bioprosthetic valve present in the mitral position. MV peak gradient, 16.8 mmHg. The mean mitral valve  gradient is 11.0 mmHg. Severe TR trivial PR.    Venous doppler revealed There appears to be, what may be consistent with, a partially thrombosed  pseudoaneurysm in the popliteal fossa/proximal calf area adjacent to an  effusion filled with mixed echoes. Further imaging should be considered to  differentiate/classify findings.   Pt underwent cardiac cath 05/25/22 with VG to RCA patent and LIMA to LAD patent Severe mid LAD stenosis. Competitive filling is seen distally from the patent LIMA graft.  Near normal right and left heart pressures.  CO 6.92 L/min CI 3.33 --Rt brachial vein was used for RHC and unable to access Lt radial artery so Rt groin was used.    It was felt the symptoms were related to his severe TR.  Plans for referral to outpt AHF.  Pt was changed from coumadin to Eliquis. He was discharged on 20 mg lasix BID  --he was seen by [name] 06/03/22 "RV dysfunction is now likely multifactorial: he is chronically RV paced with device interrogation from 05/15/22 demonstrating 96% RV pacing, he has a dilated RV leading to poor coaptation of the tricuspid valve and as a result severe TR and in addition has likely moderate to severe stenosis of his bioprosthetic mitral valve. From a hemodynamic standpoint though the MS does not appear to be having a significant effect on his PA pressures or PCWP"  dig was started along with jardiance.  Prior to his MVR and AVR and pacing his LV function and RV function were intact.    Plans for TEE were made.    He was seen back   06/09/22 for acute visit with increased weakness and falls at home.  His legs give out.  He had an episode of confusion 1-2 weeks ago.  No chest pain. He is WC bound due to LE pain from arthritis.  His BNP was 344 so lasix increased.     Today pt presented to ER by EMS aas COde Stroke, when pt woke at 6 a he had numbness and heaviness in Left arm and it was cold to touch and no pulse palpated.  Rt arm was warm with a pulse. His last dose of Eliquis was 06/14/22 pm.  He reported slurred speech to EMS so neuro saw and CTA of head and neck were negative.  CTA of the chest does reveal a  high right brachial arterial occlusion.  MRI of brain  [name] was consulted and imaging Left upper extremity: left subclavian, vertebral, and axillary arteries are patent. There is acute occlusion of the high left brachial artery at the level of the left humeral neck. Below this the left brachial artery is non-opacified. Limited assessment of the more peripheral left upper extremities because of the proximal brachial artery occlusion.  NON palpable/doppler signal left radial ulnar or palmer.  Weak brachial doppler signal, motor and gross sensation intact left UE  Pt has undergone lt upper extremity thromboembolectomy via brachial and radial artery approach. Left upper extremity angiogram with catheter selection of left subclavian and axillary arteries      EKG:  The EKG was personally reviewed and demonstrates:  V paced with underlying flutter most likely -- on 06/09/22 a sensed and V paced  Telemetry:  Telemetry was personally reviewed and demonstrates:  v apcing  Na 134, K+ 3.3 now 3.5 glucose 165 BUN 14 Cr 1.02 T protein 5.9 total bilirubin 2.0  WBC 18.1 Hgb 10.4 plts 79 was 87 on 5/1 previously  159 to 134 (in 2020 was 123- 122)  Dig level today 0.5  INR was 2.0   BP 116/86 P 92 R 20-29 temp 99.3   No chest pain no SOB he was feeling a little better with increase of BB.   Past Medical History:  Diagnosis Date   BCE (basal cell epithelioma), arm    RIGHT SHOULDER   Bradycardia    Bumps on skin    on left side of nose for last 3 weeks   CAD (coronary artery disease)    a. s/p CABGx2 10/11/18 LIMA to LAD, SVG to RCA, EVH via right thigh.   Dyslipidemia    Ectatic thoracic aorta (HCC)    Esophageal stricture    GERD (gastroesophageal reflux disease)    Heart murmur    sees Michael [name]   Hyperlipidemia    Incidental pulmonary nodule, > 3mm and < 8mm 10/04/2018   Noted on CTA   Inguinal hernia    bilateral   Obesity    Presence of permanent cardiac pacemaker    Prostate cancer  (HCC)    S/P aortic valve replacement with bioprosthetic valve 10/11/2018   23 mm Edwards Inspiris Resilia stented bovine pericardial tissue valve, miltral valve done also   S/P CABG x 2 10/11/2018   LIMA to LAD, SVG to RCA, EVH via right thigh   S/P mitral valve replacement with bioprosthetic valve 10/11/2018   27 mm Medtronic Mosaic stented porcine bioprosthetic tissue valve   S/P patent foramen ovale closure 10/11/2018   S/P placement of cardiac pacemaker    Smoker     Past Surgical History:    Procedure Laterality Date   AORTIC VALVE REPLACEMENT N/A 10/11/2018   Procedure: AORTIC VALVE REPLACEMENT (AVR) with 23 Inspiris Bioprosthetic Aortic valve.;  Surgeon: Owen, Clarence H, MD;  Location: [facility];  Service: Open Heart Surgery;  Laterality: N/A;   CLIPPING OF ATRIAL APPENDAGE N/A 10/11/2018   Procedure: CLIPPING OF LEFT ATRIAL APPENDAGE with 45 AtriCure Clip.;  Surgeon: Owen, Clarence H, MD;  Location: [facility];  Service: Open Heart Surgery;  Laterality: N/A;   COLONOSCOPY  03/2008,2004   Michael. Hayes   CORONARY ARTERY BYPASS GRAFT N/A 10/11/2018   Procedure: CORONARY ARTERY BYPASS GRAFTING (CABG) x 2 using LIMA to the LAD and endoscopic greater saphenous vein harvesting to the RCA.;  Surgeon: Owen, Clarence H, MD;  Location: [facility];  Service: Open Heart Surgery;  Laterality: N/A;   CYSTOSCOPY  04/04/2019   Procedure: CYSTOSCOPY;  Surgeon: Winter, Michael Aaron, MD;  Location: [facility];  Service: Urology;;   HERNIA REPAIR  12/11/10   [facility]   MITRAL VALVE REPLACEMENT N/A 10/11/2018   Procedure: MITRAL VALVE (MV) REPLACEMENT with 27 Mosaic Bioprosthetic Mitral valve.;  Surgeon: Owen, Clarence H, MD;  Location: [facility];  Service: Open Heart Surgery;  Laterality: N/A;   PACEMAKER IMPLANT N/A 10/17/2018   Procedure: PACEMAKER IMPLANT;  Surgeon: Allred, James, MD;  Location: [facility];  Service: Cardiovascular;  Laterality: N/A;   RADIOACTIVE SEED IMPLANT N/A 04/04/2019   Procedure:  RADIOACTIVE SEED IMPLANT/BRACHYTHERAPY IMPLANT, cystoscopy;  Surgeon: Winter, Michael Aaron, MD;  Location: [facility];  Service: Urology;  Laterality: N/A;  58 seeds   REPAIR OF PATENT FORAMEN OVALE N/A 10/11/2018   Procedure: CLOSURE OF PATENT FORAMEN OVALE;  Surgeon: Owen, Clarence H, MD;  Location: [facility];  Service: Open Heart Surgery;  Laterality: N/A;   RIGHT/LEFT HEART CATH AND CORONARY ANGIOGRAPHY N/A 09/15/2018   Procedure: RIGHT/LEFT HEART CATH AND CORONARY ANGIOGRAPHY;  Surgeon: [name], Michael D, MD;  [facility]: [facility];  Service: Cardiovascular;  Laterality: N/A;   RIGHT/LEFT HEART CATH AND CORONARY/GRAFT ANGIOGRAPHY N/A 05/25/2022   Procedure: RIGHT/LEFT HEART CATH AND CORONARY/GRAFT ANGIOGRAPHY;  Surgeon: [name], Michael D, MD;  [facility]: [facility];  Service: Cardiovascular;  Laterality: N/A;   TEE WITHOUT CARDIOVERSION N/A 12/12/2015   Procedure: TRANSESOPHAGEAL ECHOCARDIOGRAM (TEE);  Surgeon: Mihai Croitoru, MD;  Location: [facility];  Service: Cardiovascular;  Laterality: N/A;   TEE WITHOUT CARDIOVERSION N/A 10/11/2018   Procedure: TRANSESOPHAGEAL ECHOCARDIOGRAM (TEE);  Surgeon: Owen, Clarence H, MD;  Location: [facility];  Service: Open Heart Surgery;  Laterality: N/A;   THYMECTOMY  1976   radaiation tx done     Home Medications:  Prior to Admission medications   Medication Sig Start Date End Date Taking? Authorizing Provider  acetaminophen (TYLENOL) 325 MG tablet Take 650 mg by mouth as needed for moderate pain.   Yes [provider]  apixaban (ELIQUIS) 5 MG TABS tablet Take 1 tablet (5 mg total) by mouth 2 (two) times daily. 05/26/22  Yes Akula, Vijaya, MD  atorvastatin (LIPITOR) 20 MG tablet Take 1 tablet (20 mg total) by mouth daily. 05/18/22  Yes [name], Michael C, MD  digoxin (LANOXIN) 0.125 MG tablet Take 1 tablet (0.125 mg total) by mouth daily. 06/03/22  Yes [name], Aditya, DO  empagliflozin (JARDIANCE) 10 MG TABS tablet  Take 1 tablet (10 mg total) by mouth daily before breakfast. 06/03/22  Yes [name], Aditya, DO  furosemide (LASIX) 20 MG tablet Take 20 mg by mouth   2 (two) times daily.   Yes [provider]  Iron, Ferrous Sulfate, 325 (65 Fe) MG TABS Take 1 tablet by mouth 2 (two) times daily. Patient taking differently: Take 1 tablet by mouth daily. 05/26/22  Yes Akula, Vijaya, MD  metoprolol tartrate (LOPRESSOR) 25 MG tablet Take 1.5 tablets (37.5 mg total) by mouth 2 (two) times daily. 06/09/22  Yes Milford, Jessica M, FNP  oxymetazoline (AFRIN) 0.05 % nasal spray Place 1 spray into both nostrils at bedtime as needed for congestion.   Yes [provider]  polyethylene glycol powder (GLYCOLAX/MIRALAX) 17 GM/SCOOP powder Take 17 g by mouth daily as needed for mild constipation. 05/26/22  Yes Akula, Vijaya, MD    Inpatient Medications: Scheduled Meds:  [MAR Hold] sodium chloride flush  3 mL Intravenous Once   Continuous Infusions:  sodium chloride     albumin human     heparin     heparin 500 Units/hr (06/15/22 1423)   lactated ringers Stopped (06/15/22 1414)   PRN Meds: sodium chloride, albumin human, fentaNYL (SUBLIMAZE) injection, heparin, hydrALAZINE, labetalol, ondansetron (ZOFRAN) IV, oxyCODONE **OR** oxyCODONE  Allergies:   No Known Allergies  Social History:   Social History   Socioeconomic History   Marital status: Married    Spouse name: Not on file   Number of children: 0   Years of education: Not on file   Highest education level: Not on file  Occupational History   Occupation: Photographer    Employer: [employer]  Tobacco Use   Smoking status: Former    Packs/day: 0.25    Years: 35.00    Additional pack years: 0.00    Total pack years: 8.75    Types: Cigarettes    Quit date: 09/04/2018    Years since quitting: 3.7   Smokeless tobacco: Never  Vaping Use   Vaping Use: Never used  Substance and Sexual Activity   Alcohol use: Yes    Alcohol/week: 1.0 standard  drink of alcohol    Types: 1 Cans of beer per week    Comment: rare   Drug use: No   Sexual activity: Yes  Other Topics Concern   Not on file  Social History Narrative   Not on file   Social Determinants of Health   Financial Resource Strain: Low Risk  (05/11/2022)   Overall Financial Resource Strain (CARDIA)    Difficulty of Paying Living Expenses: Not hard at all  Food Insecurity: No Food Insecurity (05/27/2022)   Hunger Vital Sign    Worried About Running Out of Food in the Last Year: Never true    Ran Out of Food in the Last Year: Never true  Transportation Needs: No Transportation Needs (05/27/2022)   PRAPARE - Transportation    Lack of Transportation (Medical): No    Lack of Transportation (Non-Medical): No  Physical Activity: Inactive (05/11/2022)   Exercise Vital Sign    Days of Exercise per Week: 0 days    Minutes of Exercise per Session: 0 min  Stress: No Stress Concern Present (05/11/2022)   [employer] - Occupational Stress Questionnaire    Feeling of Stress : Not at all  Social Connections: Not on file  Intimate Partner Violence: Not At Risk (05/23/2022)   Humiliation, Afraid, Rape, and Kick questionnaire    Fear of Current or Ex-Partner: No    Emotionally Abused: No    Physically Abused: No    Sexually Abused: No    Family History:      Family History  Problem Relation Age of Onset   Colon cancer Mother    Lung cancer Father        smoker   CAD Neg Hx    Pancreatic cancer Neg Hx    Breast cancer Neg Hx      ROS:  Please see the history of present illness.  General:no colds or fevers, no weight changes Skin:no rashes or ulcers HEENT:no blurred vision, no congestion CV:see HPI PUL:see HPI GI:no diarrhea constipation or melena, no indigestion GU:no hematuria, no dysuria MS:no joint pain, no claudication--see HPI Neuro:no syncope, no lightheadedness Endo:no diabetes, no thyroid disease  All other ROS reviewed and negative.      Physical Exam/Data:   Vitals:   06/15/22 1430 06/15/22 1445 06/15/22 1500 06/15/22 1515  BP: (!) 91/51 (!) 82/63 (!) 87/67 (!) 84/67  Pulse: 87 (!) 109 94 95  Resp: 20 (!) 25 20 20  Temp:      TempSrc:      SpO2: 96% 97% 96% 93%  Weight:      Height:        Intake/Output Summary (Last 24 hours) at 06/15/2022 1527 Last data filed at 06/15/2022 1414 Gross per 24 hour  Intake 800 ml  Output 150 ml  Net 650 ml      06/15/2022    9:19 AM 06/09/2022    3:09 PM 06/03/2022   11:46 AM  Last 3 Weights  Weight (lbs) 209 lb 209 lb 212 lb  Weight (kg) 94.802 kg 94.802 kg 96.163 kg     Body mass index is 32.73 kg/m.  General:  Well nourished, well developed, in no acute distress resting comfortably in PACU HEENT: normal Neck: no JVD Vascular: No carotid bruits; Distal pulses 2+ bilaterally Cardiac:  normal S1, S2; RRR; + murmur no gallup or rub Lungs:  clear to auscultation bilaterally, no wheezing, rhonchi or rales  Abd: soft, nontender, no hepatomegaly  Ext: no edema Musculoskeletal:  No deformities, BUE and BLE strength normal and equal Skin: warm and dry  Neuro:  CNs 2-12 intact, no focal abnormalities noted Psych:  Normal affect   Relevant CV Studies: ECHO: - 05/23/22: Normal LV function, severely dilated RV with moderately reduced function, moderate stenosis of prosthesis (gradient 11mmHg), severe TR due to poor coaptation of leaflets, s/p AVR as per my personal interpretation - 06/13/19: Normal LV function with septal bowing to the left; dilated RV with mildly reduced function. Mitral bioprosthesis gradient of 6mmHg - 06/27/18: Normal LV function, normal RV function. Severe MS/AS with moderate AI.    CATH: - 05/25/22:   Prox LAD lesion is 40% stenosed.   Mid LAD lesion is 95% stenosed.   Prox Cx to Mid Cx lesion is 30% stenosed.   1st Mrg lesion is 30% stenosed.   3rd Mrg lesion is 30% stenosed.   Prox RCA lesion is 100% stenosed.   SVG graft was visualized by angiography  and is normal in caliber.   LIMA graft was visualized by angiography and is normal in caliber.   Severe mid LAD stenosis. Competitive filling is seen distally from the patent LIMA graft.  Patent LIMA graft to the mid LAD.  Mild non-obstructive disease in the Circumflex artery.  Large dominant RCA with chronic proximal occlusion. The mid and distal RCA fills from the patent vein graft.  Near normal right and left heart pressures.  CO 6.92 L/min CI 3.33    Diagnostic Dominance: Right  Intervention  - 09/15/2018: Prox   RCA lesion is 100% stenosed. Prox Cx to Mid Cx lesion is 30% stenosed. 1st Mrg lesion is 30% stenosed. 3rd Mrg lesion is 30% stenosed. Prox LAD lesion is 40% stenosed. Mid LAD lesion is 95% stenosed.   1. Severe stenosis mid LAD 2. Mild non-obstructive disease in the LAD 3. Chronic occlusion of the proximal RCA. Filling of the proximal, mid and distal RCA from left to right collaterals 4. Severe aortic stenosis (mean gradient 34.1 mmHg, peak to peak gradient 32 mmHg, AVA 1.1 cm2) 5. Severe mitral stenosis by echo.   Laboratory Data:  High Sensitivity Troponin:   Recent Labs  Lab 05/23/22 1126 05/23/22 1351 05/24/22 0826 05/24/22 1016  TROPONINIHS 185* 178* 88* 72*     Chemistry Recent Labs  Lab 06/09/22 1552 06/15/22 0830 06/15/22 0835  NA 130* 134* 135  K 3.5 3.3* 3.5  CL 99 106 106  CO2 21* 14*  --   GLUCOSE 119* 165* 171*  BUN 14 14 16  CREATININE 0.98 1.02 0.90  CALCIUM 8.5* 7.7*  --   MG 2.2  --   --   GFRNONAA >60 >60  --   ANIONGAP 10 14  --     Recent Labs  Lab 06/09/22 1552 06/15/22 0830  PROT 7.3 5.9*  ALBUMIN 2.8* 2.3*  AST 25 26  ALT 15 12  ALKPHOS 81 73  BILITOT 2.1* 2.0*   Lipids No results for input(s): "CHOL", "TRIG", "HDL", "LABVLDL", "LDLCALC", "CHOLHDL" in the last 168 hours.  Hematology Recent Labs  Lab 06/09/22 1552 06/15/22 0835 06/15/22 0845  WBC 14.7*  --  18.1*  RBC 4.77  --  4.16*  HGB 12.2* 11.9* 10.4*   HCT 37.1* 35.0* 34.8*  MCV 77.8*  --  83.7  MCH 25.6*  --  25.0*  MCHC 32.9  --  29.9*  RDW 17.8*  --  18.6*  PLT 87*  --  79*   Thyroid No results for input(s): "TSH", "FREET4" in the last 168 hours.  BNP Recent Labs  Lab 06/09/22 1552  BNP 442.7*    DDimer No results for input(s): "DDIMER" in the last 168 hours.   Radiology/Studies:  HYBRID OR IMAGING ([facility] ONLY)  Result Date: 06/15/2022 There is no interpretation for this exam.  This order is for images obtained during a surgical procedure.  Please See "Surgeries" Tab for more information regarding the procedure.   CT ANGIO UP EXTREM LEFT W &/OR WO CONTAST  Result Date: 06/15/2022 CLINICAL DATA:  Acute aortic syndrome, left arm weakness, diminished pulses EXAM: CT ANGIOGRAPHY OF THE LEFT UPPEREXTREMITY TECHNIQUE: Multidetector CT imaging of the LEFT upperwas performed using the standard protocol during bolus administration of intravenous contrast. Multiplanar CT image reconstructions and MIPs were obtained to evaluate the vascular anatomy. RADIATION DOSE REDUCTION: This exam was performed according to the departmental dose-optimization program which includes automated exposure control, adjustment of the mA and/or kV according to patient size and/or use of iterative reconstruction technique. CONTRAST:  175mL OMNIPAQUE IOHEXOL 350 MG/ML SOLN COMPARISON:  None Available. FINDINGS: Included chest demonstrates atherosclerosis of aorta without significant aneurysm. No aortic dissection, mediastinal hemorrhage or hematoma. Postop changes from coronary bypass. Native coronary atherosclerosis and aortic valve calcifications present. Included central pulmonary arteries are patent. Bovine arch anatomy appearing patent and tortuosity. Included lungs demonstrate no acute airspace process. Basilar atelectasis. Pleural thickening noted on the left. No acute osseous finding. Left upper extremity: left subclavian, vertebral, and axillary arteries are patent.  There is acute occlusion   of the high left brachial artery at the level of the left humeral neck. Below this the left brachial artery is non-opacified. Limited assessment of the more peripheral left upper extremities because of the proximal brachial artery occlusion. Review of the MIP images confirms the above findings. IMPRESSION: Acute occlusion of the high left proximal brachial artery at the level of the left humeral neck. These results were called by telephone at the time of interpretation on 06/15/2022 at 10:17 am to provider Michael. Kevin Steinel, who verbally acknowledged these results. 4 Electronically Signed   By: M.  Shick M.D.   On: 06/15/2022 10:19   CT HEAD CODE STROKE WO CONTRAST  Result Date: 06/15/2022 CLINICAL DATA:  Code stroke.  Left arm weakness and slurred speech. EXAM: CT ANGIOGRAPHY HEAD AND NECK TECHNIQUE: Multidetector CT imaging of the head and neck was performed using the standard protocol during bolus administration of intravenous contrast. Multiplanar CT image reconstructions and MIPs were obtained to evaluate the vascular anatomy. Carotid stenosis measurements (when applicable) are obtained utilizing NASCET criteria, using the distal internal carotid diameter as the denominator. RADIATION DOSE REDUCTION: This exam was performed according to the departmental dose-optimization program which includes automated exposure control, adjustment of the mA and/or kV according to patient size and/or use of iterative reconstruction technique. CONTRAST:  175mL OMNIPAQUE IOHEXOL 350 MG/ML SOLN COMPARISON:  None Available. FINDINGS: CT HEAD FINDINGS Brain: There is no acute intracranial hemorrhage, extra-axial fluid collection, or acute infarct. Parenchymal volume is normal. The ventricles are normal in size. Lasala-white differentiation is preserved. The pituitary and suprasellar region are normal. There is no mass lesion. There is no mass effect or midline shift. Vascular: No hyperdense vessel is seen.  There is a small calcification overlying the right temporal lobe which may reflect a vascular calcification of indeterminate age. Skull: Normal. Negative for fracture or focal lesion. Sinuses/Orbits: The paranasal sinuses are clear. The globes and orbits are unremarkable. Other: None. ASPECTS (Alberta Stroke Program Early CT Score) - Ganglionic level infarction (caudate, lentiform nuclei, internal capsule, insula, M1-M3 cortex): 7 - Supraganglionic infarction (M4-M6 cortex): 3 Total score (0-10 with 10 being normal): 10 CTA NECK FINDINGS Aortic arch: There is calcified plaque in the imaged aortic arch. The origins of the major branch vessels are patent. The subclavian arteries are patent to the level imaged. Right carotid system: The right common, internal, and external carotid arteries are patent, without hemodynamically significant stenosis or occlusion. There is no evidence of dissection or aneurysm. Left carotid system: The left common, internal, and external carotid arteries are patent, with minimal plaque of the bifurcation but no significant stenosis or occlusion. There is no evidence of dissection or aneurysm. Vertebral arteries: The vertebral arteries are patent, without hemodynamically significant stenosis or occlusion. There is no evidence of dissection or aneurysm. Skeleton: There is no acute osseous abnormality or suspicious osseous lesion. There is no visible canal hematoma. Other neck: The soft tissues of the neck are unremarkable. Upper chest: Assessed on the separately dictated CTA chest. Review of the MIP images confirms the above findings CTA HEAD FINDINGS Anterior circulation: The intracranial ICAs are patent, with minimal plaque but no hemodynamically significant stenosis or occlusion. The bilateral MCAs are patent, without proximal stenosis or occlusion. The bilateral ACAs are patent, without proximal stenosis or occlusion. There is focal calcification and a right M3 branch without occlusion  (6-167). The anterior communicating artery is normal. There is no aneurysm or AVM. Posterior circulation: The bilateral V4 segments are patent. The   basilar artery is patent. The major cerebellar arteries appear patent. The bilateral PCAs are patent, without proximal stenosis or occlusion. The right PCA is primarily supplied by the posterior communicating artery with a diminutive P1 segment (fetal origin). A small left posterior communicating artery is also identified. There is no aneurysm or AVM. Venous sinuses: Not well assessed due to bolus timing. Anatomic variants: As above. Review of the MIP images confirms the above findings IMPRESSION: 1. No acute intracranial pathology. 2. Patent vasculature of the head and neck with no hemodynamically significant stenosis or occlusion. Findings communicated to Michael Kirkpatrick via AMION at 8:40am. The CTA results were communicated at 8:54 am. Electronically Signed   By: Peter  Noone M.D.   On: 06/15/2022 09:20   CT ANGIO HEAD NECK W WO CM (CODE STROKE)  Result Date: 06/15/2022 CLINICAL DATA:  Code stroke.  Left arm weakness and slurred speech. EXAM: CT ANGIOGRAPHY HEAD AND NECK TECHNIQUE: Multidetector CT imaging of the head and neck was performed using the standard protocol during bolus administration of intravenous contrast. Multiplanar CT image reconstructions and MIPs were obtained to evaluate the vascular anatomy. Carotid stenosis measurements (when applicable) are obtained utilizing NASCET criteria, using the distal internal carotid diameter as the denominator. RADIATION DOSE REDUCTION: This exam was performed according to the departmental dose-optimization program which includes automated exposure control, adjustment of the mA and/or kV according to patient size and/or use of iterative reconstruction technique. CONTRAST:  175mL OMNIPAQUE IOHEXOL 350 MG/ML SOLN COMPARISON:  None Available. FINDINGS: CT HEAD FINDINGS Brain: There is no acute intracranial hemorrhage,  extra-axial fluid collection, or acute infarct. Parenchymal volume is normal. The ventricles are normal in size. Karn-white differentiation is preserved. The pituitary and suprasellar region are normal. There is no mass lesion. There is no mass effect or midline shift. Vascular: No hyperdense vessel is seen. There is a small calcification overlying the right temporal lobe which may reflect a vascular calcification of indeterminate age. Skull: Normal. Negative for fracture or focal lesion. Sinuses/Orbits: The paranasal sinuses are clear. The globes and orbits are unremarkable. Other: None. ASPECTS (Alberta Stroke Program Early CT Score) - Ganglionic level infarction (caudate, lentiform nuclei, internal capsule, insula, M1-M3 cortex): 7 - Supraganglionic infarction (M4-M6 cortex): 3 Total score (0-10 with 10 being normal): 10 CTA NECK FINDINGS Aortic arch: There is calcified plaque in the imaged aortic arch. The origins of the major branch vessels are patent. The subclavian arteries are patent to the level imaged. Right carotid system: The right common, internal, and external carotid arteries are patent, without hemodynamically significant stenosis or occlusion. There is no evidence of dissection or aneurysm. Left carotid system: The left common, internal, and external carotid arteries are patent, with minimal plaque of the bifurcation but no significant stenosis or occlusion. There is no evidence of dissection or aneurysm. Vertebral arteries: The vertebral arteries are patent, without hemodynamically significant stenosis or occlusion. There is no evidence of dissection or aneurysm. Skeleton: There is no acute osseous abnormality or suspicious osseous lesion. There is no visible canal hematoma. Other neck: The soft tissues of the neck are unremarkable. Upper chest: Assessed on the separately dictated CTA chest. Review of the MIP images confirms the above findings CTA HEAD FINDINGS Anterior circulation: The  intracranial ICAs are patent, with minimal plaque but no hemodynamically significant stenosis or occlusion. The bilateral MCAs are patent, without proximal stenosis or occlusion. The bilateral ACAs are patent, without proximal stenosis or occlusion. There is focal calcification and a right M3 branch   without occlusion (6-167). The anterior communicating artery is normal. There is no aneurysm or AVM. Posterior circulation: The bilateral V4 segments are patent. The basilar artery is patent. The major cerebellar arteries appear patent. The bilateral PCAs are patent, without proximal stenosis or occlusion. The right PCA is primarily supplied by the posterior communicating artery with a diminutive P1 segment (fetal origin). A small left posterior communicating artery is also identified. There is no aneurysm or AVM. Venous sinuses: Not well assessed due to bolus timing. Anatomic variants: As above. Review of the MIP images confirms the above findings IMPRESSION: 1. No acute intracranial pathology. 2. Patent vasculature of the head and neck with no hemodynamically significant stenosis or occlusion. Findings communicated to Michael Kirkpatrick via AMION at 8:40am. The CTA results were communicated at 8:54 am. Electronically Signed   By: Peter  Noone M.D.   On: 06/15/2022 09:20     Assessment and Plan:   Left UE ischemia with acute occlusion of high lt prox brachial artery at level of Left humeral neck.  (His PAF managed with eliquis- recent change from coumadin though INR still 2.0 on admit)  recent cardiac cath with failed Lt radial approach and Rt groin use. Last dose of eiliquis was 06/14/22 pm. S/p urgent lt upper extremity thromboembolectomy via brachial and radial artery approach. Left upper extremity angiogram with catheter selection of left subclavian and axillary arteries today CAD with hx 2V CABG in 2020 and patent grafts 05/23/22 by cath.  Hx of severe AS and MS with AVR/MVR with bioprosthetic now with trivial MR  and moderate MV prosthetic stenosis with gradient of 11 plans for TEE to eval further Moderate to severe RV enlargement and Severe TR  Appears functional and secondary to RV dilation leading to poor coaptation followed by AHF Michael. Sabharwal with plan for TEE and dig was added.  Dig level 0.5 Heart block post CABG and valve surgery with ppm - last evaluated 4/624 and on 06/09/22 minimal AF/AT, 92.8% v pacing now V pacing and atrially sensing. PAFl, BB increased on 06/09/22 to 37.5 BID on eliquis ? Continue or return to coumadin.   Risk Assessment/Risk Scores:                For questions or updates, please contact [facility] Please consult [email or web address] for contact info under    Signed, Laura Ingold, NP  06/15/2022 3:27 PM  Patient seen and examined and agree with Laura Ingold, NP as detailed above.   In brief, the patient is a 72 y.o. male with a hx of CAD with CABG X 2 vessel and L atrial clipping, + foramen ovale closure, s/p aortic and MVR bioprosthetic and PPM, atrial flutter, tobacco abuse, HLD, severe TR with RV dysfunction who presented to the ER with a cold left arm with lack of a pulse found to have acute occlusion of the high left brachial artery now s/p thromboembolectomy. Cardiology is now consulted for further evaluation for possible cardiac source of embolus.  The patient has known complex cardiac history as detailed above. Presented on this admission with left arm numbness and heaviness found to have acute occlusion of the brachial artery as detailed above. Notably, had cath in 05/2022 but left radial access failed and right groin was used for the procedure. Was also recently switched from warfarin to apixaban for AC, but he has been compliant with AC since discharge with no skipped doses. Given concern for cardiac source of embolus in the setting of known Afib   with recent change in AC, AVR, and MVR with moderate MS, will plan for TEE to assess for cardiac source of  embolus. May ultimately need warfarin given degree of MS.  GEN: No acute distress.   Neck: No JVD Cardiac: RRR, 2/6 systolic murmur Respiratory: Clear to auscultation bilaterally. GI: Soft, nontender, non-distended  MS: Left forearm mildly swollen with palpable radial pulse Neuro:  Nonfocal  Psych: Normal affect    Plan: -Check TEE tomorrow for cardiac source of embolus -Continue heparin gtt for AC; may merit warfarin long-term pending degree of MS -Continue home liptior 20mg daily -Continue home digoxin 0.125mg daily -Continue home jardiance 10mg daily -Continue home lasix 20mg PO BID -Continue home metop 37.5mg BID  Emanie Behan, MD   

## 2022-06-15 NOTE — Consult Note (Signed)
Neurology Consultation Reason for Consult: Code stroke Referring Physician: Carver Fila  CC: Left arm weakness  History is obtained from: Patient  HPI: [name] is a 72 y.o. male with history of artificial valves and atrial fibrillation on Eliquis who presents with left arm discomfort and weakness.  He states that he woke up noticing that his left arm was cold and weak and therefore called 911.  He also reported to EMS that he was slurring his speech and therefore a code stroke was activated.  Given the report of slurred speech, he was evaluated with CT/CTA of the head and neck which were negative.  CTA of the chest does reveal a high right brachial arterial occlusion.   LKW: 5/6 prior to bed tnk given?: no, anticoagulated   Past Medical History:  Diagnosis Date   BCE (basal cell epithelioma), arm    RIGHT SHOULDER   Bradycardia    Bumps on skin    on left side of nose for last 3 weeks   CAD (coronary artery disease)    a. s/p CABGx2 10/11/18 LIMA to LAD, SVG to RCA, EVH via right thigh.   Dyslipidemia    Ectatic thoracic aorta (HCC)    Esophageal stricture    GERD (gastroesophageal reflux disease)    Heart murmur    sees dr Rose Fillers   Hyperlipidemia    Incidental pulmonary nodule, > 3mm and < 8mm 10/04/2018   Noted on CTA   Inguinal hernia    bilateral   Obesity    Presence of permanent cardiac pacemaker    Prostate cancer (HCC)    S/P aortic valve replacement with bioprosthetic valve 10/11/2018   23 mm Edwards Inspiris Resilia stented bovine pericardial tissue valve, miltral valve done also   S/P CABG x 2 10/11/2018   LIMA to LAD, SVG to RCA, EVH via right thigh   S/P mitral valve replacement with bioprosthetic valve 10/11/2018   27 mm Medtronic Mosaic stented porcine bioprosthetic tissue valve   S/P patent foramen ovale closure 10/11/2018   S/P placement of cardiac pacemaker    Smoker      Family History  Problem Relation Age of Onset   Colon cancer Mother     Lung cancer Father        smoker   CAD Neg Hx    Pancreatic cancer Neg Hx    Breast cancer Neg Hx      Social History:  reports that he quit smoking about 3 years ago. His smoking use included cigarettes. He has a 8.75 pack-year smoking history. He has never used smokeless tobacco. He reports current alcohol use of about 1.0 standard drink of alcohol per week. He reports that he does not use drugs.   Exam: Current vital signs: BP 100/73   Pulse (!) 115   Temp 99.2 F (37.3 C)   Resp 18   Ht 5\' 7"  (1.702 m)   Wt 94.8 kg   SpO2 99%   BMI 32.73 kg/m  Vital signs in last 24 hours: Temp:  [99.2 F (37.3 C)] 99.2 F (37.3 C) (05/07 0903) Pulse Rate:  [113-116] 115 (05/07 1024) Resp:  [17-31] 18 (05/07 1024) BP: (93-102)/(60-73) 100/73 (05/07 1024) SpO2:  [98 %-100 %] 99 % (05/07 1024) Weight:  [94.8 kg] 94.8 kg (05/07 0919)   Physical Exam  Appears well-developed and well-nourished.   Neuro: Mental Status: Patient is awake, alert, oriented to person, place, month, year, and situation. Patient is able to give  a clear and coherent history. No signs of aphasia or neglect Cranial Nerves: II: Visual Fields are full. Pupils are equal, round, and reactive to light.   III,IV, VI: EOMI without ptosis or diploplia.  V: Facial sensation is symmetric to temperature VII: possible mild decreased nasolabial fold on the left with symmetric smile, this is consistent with his previous photo in epic so not new VIII: hearing is intact to voice X: Uvula elevates symmetrically XI: Shoulder shrug is symmetric. XII: tongue is midline without atrophy or fasciculations.  Motor: Tone is normal. Bulk is normal.  He has 4+/5 weakness of the left upper extremity, also mildly drift in his left lower extremity with 4+/5 Sensory: Strength Sensation is symmetric to light touch and temperature in the arms and legs. Cerebellar: No clear ataxia     I have reviewed labs in epic and the results  pertinent to this consultation are: Creatinine 0.9, low ionized calcium  I have reviewed the images obtained: CT/CTA of the head and neck-negative  Impression: 72 year old male with ischemic left arm as well as slurred speech.  Certainly his weakness of his arm associated with cold limb and poor capillary refill is strongly suggestive of an peripheral artery occlusion.  I am not able to appreciate much in the way of slurred speech, and it is possible that simply because he was fairly focused on the fact that he had a weak arm he was thinking that it was slurred, but given he does have a single embolic event it is possible he has had another embolic event and therefore I would favor getting an MRI of the brain.  If there is a cerebral embolus, it is very small and I do not think I would favor delaying surgery if there would be any risk in doing so.  Recommendations: 1) MRI brain 2) neurology will follow-up only if MRI brain is positive.   Ritta Slot, MD [facility] [phone]  If 7pm- 7am, please page neurology on call as listed in AMION.

## 2022-06-15 NOTE — Consult Note (Addendum)
[facility] [place] [place] NOTE   MRN : [id number]  Reason for Consult: ischemia left UE  Referring Physician: ED  History of Present Illness: 72 y/o male brought to the [facility] ED with CC: left hand numbness and pain since this am.  Dr. Clifton James was unable to access the left radial artery for cardiac cath 05/25/22.  He denies issues of pain, loss of sensation or motor until today at 6am.  There was a report of possible slurred speech.  Stroke was ruled out and pending further work up for embolic event.     Past medical history CAD, history of CABG x 2, mitral valve, patent ovale closure, and pacemaker placement 10/2018. Last dose of Eliquis last night.     Current Facility-Administered Medications  Medication Dose Route Frequency Provider Last Rate Last Admin   sodium chloride flush (NS) 0.9 % injection 3 mL  3 mL Intravenous Once Cathren Laine, MD       Current Outpatient Medications  Medication Sig Dispense Refill   acetaminophen (TYLENOL) 325 MG tablet Take 650 mg by mouth as needed for moderate pain.     apixaban (ELIQUIS) 5 MG TABS tablet Take 1 tablet (5 mg total) by mouth 2 (two) times daily. 60 tablet 1   atorvastatin (LIPITOR) 20 MG tablet Take 1 tablet (20 mg total) by mouth daily. 90 tablet 3   digoxin (LANOXIN) 0.125 MG tablet Take 1 tablet (0.125 mg total) by mouth daily. 90 tablet 3   empagliflozin (JARDIANCE) 10 MG TABS tablet Take 1 tablet (10 mg total) by mouth daily before breakfast. 30 tablet 11   furosemide (LASIX) 20 MG tablet Take 20 mg by mouth daily.     Iron, Ferrous Sulfate, 325 (65 Fe) MG TABS Take 1 tablet by mouth 2 (two) times daily. (Patient taking differently: Take 1 tablet by mouth daily.) 60 tablet 1   metoprolol tartrate (LOPRESSOR) 25 MG tablet Take 1.5 tablets (37.5 mg total) by mouth 2 (two) times daily. 180 tablet 3   oxymetazoline (AFRIN) 0.05 % nasal spray Place 1 spray into both nostrils at bedtime as needed for congestion.      polyethylene glycol powder (GLYCOLAX/MIRALAX) 17 GM/SCOOP powder Take 17 g by mouth daily as needed for mild constipation. 238 g 0    Pt meds include: Statin :Yes Betablocker: Yes ASA: No Other anticoagulants/antiplatelets: Eliquis last dose last night  Past Medical History:  Diagnosis Date   BCE (basal cell epithelioma), arm    RIGHT SHOULDER   Bradycardia    Bumps on skin    on left side of nose for last 3 weeks   CAD (coronary artery disease)    a. s/p CABGx2 10/11/18 LIMA to LAD, SVG to RCA, EVH via right thigh.   Dyslipidemia    Ectatic thoracic aorta (HCC)    Esophageal stricture    GERD (gastroesophageal reflux disease)    Heart murmur    sees dr Rose Fillers   Hyperlipidemia    Incidental pulmonary nodule, > 3mm and < 8mm 10/04/2018   Noted on CTA   Inguinal hernia    bilateral   Obesity    Presence of permanent cardiac pacemaker    Prostate cancer (HCC)    S/P aortic valve replacement with bioprosthetic valve 10/11/2018   23 mm Edwards Inspiris Resilia stented bovine pericardial tissue valve, miltral valve done also   S/P CABG x 2 10/11/2018   LIMA to LAD, SVG to RCA, EVH via right thigh  S/P mitral valve replacement with bioprosthetic valve 10/11/2018   27 mm Medtronic Mosaic stented porcine bioprosthetic tissue valve   S/P patent foramen ovale closure 10/11/2018   S/P placement of cardiac pacemaker    Smoker     Past Surgical History:  Procedure Laterality Date   AORTIC VALVE REPLACEMENT N/A 10/11/2018   Procedure: AORTIC VALVE REPLACEMENT (AVR) with 23 Inspiris Bioprosthetic Aortic valve.;  Surgeon: Purcell Nails, MD;  Location: [facility] OR;  Service: Open Heart Surgery;  Laterality: N/A;   CLIPPING OF ATRIAL APPENDAGE N/A 10/11/2018   Procedure: CLIPPING OF LEFT ATRIAL APPENDAGE with 45 AtriCure Clip.;  Surgeon: Purcell Nails, MD;  Location: [facility] OR;  Service: Open Heart Surgery;  Laterality: N/A;   COLONOSCOPY  03/2008,2004   Dr. Madilyn Fireman   CORONARY ARTERY BYPASS GRAFT  N/A 10/11/2018   Procedure: CORONARY ARTERY BYPASS GRAFTING (CABG) x 2 using LIMA to the LAD and endoscopic greater saphenous vein harvesting to the RCA.;  Surgeon: Purcell Nails, MD;  Location[facility];  Service: Open Heart Surgery;  Laterality: N/A;   CYSTOSCOPY  04/04/2019   Procedure: CYSTOSCOPY;  Surgeon: Rene Paci, MD;  Location: [facility];  Service: Urology;;   HERNIA REPAIR  12/11/10   BIH   MITRAL VALVE REPLACEMENT N/A 10/11/2018   Procedure: MITRAL VALVE (MV) REPLACEMENT with 27 Mosaic Bioprosthetic Mitral valve.;  Surgeon: Purcell Nails, MD;  Location: [facility] OR;  Service: Open Heart Surgery;  Laterality: N/A;   PACEMAKER IMPLANT N/A 10/17/2018   Procedure: PACEMAKER IMPLANT;  Surgeon: Hillis Range, MD;  Location: [facility] INVASIVE CV LAB;  Service: Cardiovascular;  Laterality: N/A;   RADIOACTIVE SEED IMPLANT N/A 04/04/2019   Procedure: RADIOACTIVE SEED IMPLANT/BRACHYTHERAPY IMPLANT, cystoscopy;  Surgeon: Rene Paci, MD;  Location: [facility];  Service: Urology;  Laterality: N/A;  58 seeds   REPAIR OF PATENT FORAMEN OVALE N/A 10/11/2018   Procedure: CLOSURE OF PATENT FORAMEN OVALE;  Surgeon: Purcell Nails, MD;  Location: [facility] OR;  Service: Open Heart Surgery;  Laterality: N/A;   RIGHT/LEFT HEART CATH AND CORONARY ANGIOGRAPHY N/A 09/15/2018   Procedure: RIGHT/LEFT HEART CATH AND CORONARY ANGIOGRAPHY;  Surgeon: Kathleene Hazel, MD;  Location: [facility] INVASIVE CV LAB;  Service: Cardiovascular;  Laterality: N/A;   RIGHT/LEFT HEART CATH AND CORONARY/GRAFT ANGIOGRAPHY N/A 05/25/2022   Procedure: RIGHT/LEFT HEART CATH AND CORONARY/GRAFT ANGIOGRAPHY;  Surgeon: Kathleene Hazel, MD;  Location: [facility] INVASIVE CV LAB;  Service: Cardiovascular;  Laterality: N/A;   TEE WITHOUT CARDIOVERSION N/A 12/12/2015   Procedure: TRANSESOPHAGEAL ECHOCARDIOGRAM (TEE);  Surgeon: Thurmon Fair, MD;  Location: [facility] ENDOSCOPY;  Service: Cardiovascular;  Laterality:  N/A;   TEE WITHOUT CARDIOVERSION N/A 10/11/2018   Procedure: TRANSESOPHAGEAL ECHOCARDIOGRAM (TEE);  Surgeon: Purcell Nails, MD;  Location: [facility] OR;  Service: Open Heart Surgery;  Laterality: N/A;   THYMECTOMY  1976   radaiation tx done    Social History Social History   Tobacco Use   Smoking status: Former    Packs/day: 0.25    Years: 35.00    Additional pack years: 0.00    Total pack years: 8.75    Types: Cigarettes    Quit date: 09/04/2018    Years since quitting: 3.7   Smokeless tobacco: Never  Vaping Use   Vaping Use: Never used  Substance Use Topics   Alcohol use: Yes    Alcohol/week: 1.0 standard drink of alcohol    Types: 1 Cans of beer per week  Comment: rare   Drug use: No    Family History Family History  Problem Relation Age of Onset   Colon cancer Mother    Lung cancer Father        smoker   CAD Neg Hx    Pancreatic cancer Neg Hx    Breast cancer Neg Hx     No Known Allergies   REVIEW OF SYSTEMS  General: [ ]  Weight loss, [ ]  Fever, [ ]  chills Neurologic: [ ]  Dizziness, [ ]  Blackouts, [ ]  Seizure [ ]  Stroke, [ ]  "Mini stroke", [ ]  Slurred speech, [ ]  Temporary blindness; [ ]  weakness in arms or legs, [ ]  Hoarseness [ ]  Dysphagia Cardiac: [ ]  Chest pain/pressure, [ ]  Shortness of breath at rest [ ]  Shortness of breath with exertion, [ x] Atrial fibrillation or irregular heartbeat  Vascular: [ ]  Pain in legs with walking, [ ]  Pain in legs at rest, [ ]  Pain in legs at night,  [ ]  Non-healing ulcer, [ ]  Blood clot in vein/DVT,   Pulmonary: [ ]  Home oxygen, [ ]  Productive cough, [ ]  Coughing up blood, [ ]  Asthma,  [ ]  Wheezing [ ]  COPD Musculoskeletal:  [ ]  Arthritis, [ ]  Low back pain, [ ]  Joint pain Hematologic: [ ]  Easy Bruising, [ ]  Anemia; [ ]  Hepatitis Gastrointestinal: [ ]  Blood in stool, [ ]  Gastroesophageal Reflux/heartburn, Urinary: [ ]  chronic Kidney disease, [ ]  on HD - [ ]  MWF or [ ]  TTHS, [ ]  Burning with urination, [ ]  Difficulty  urinating Skin: [ ]  Rashes, [ ]  Wounds Psychological: [ ]  Anxiety, [ ]  Depression  Physical Examination Vitals:   06/15/22 0903 06/15/22 0915 06/15/22 0919 06/15/22 0945  BP: 102/68 93/60  93/73  Pulse: (!) 116 (!) 113  (!) 114  Resp: 17 (!) 31  19  Temp: 99.2 F (37.3 C)     SpO2: 100% 98%  100%  Weight:   94.8 kg   Height:   5\' 7"  (1.702 m)    Body mass index is 32.73 kg/m.  General:  WDWN in NAD Gait: Normal HENT: WNL Eyes: Pupils equal Pulmonary: normal non-labored breathing , without Rales, rhonchi,  wheezing Cardiac: RRR, without  Murmurs, rubs or gallops; No carotid bruits Abdomen: soft, NT, no masses Skin: no rashes, ulcers noted;  no Gangrene , no cellulitis; no open wounds;   Vascular Exam/Pulses:Left brachial doppler signal, no radial/ulnar or palmer Motor of left UE intact, sensation grossly intact. No acute ischemic skin changes.  Musculoskeletal: no muscle wasting or atrophy; no edema  Neurologic: A&O X 3; Appropriate Affect ;  SENSATION: normal; MOTOR FUNCTION: 5/5 Symmetric Speech is fluent/normal   Significant Diagnostic Studies: CBC Lab Results  Component Value Date   WBC 18.1 (H) 06/15/2022   HGB 10.4 (L) 06/15/2022   HCT 34.8 (L) 06/15/2022   MCV 83.7 06/15/2022   PLT 79 (L) 06/15/2022    BMET    Component Value Date/Time   NA 135 06/15/2022 0835   NA 137 05/05/2022 1230   K 3.5 06/15/2022 0835   CL 106 06/15/2022 0835   CO2 14 (L) 06/15/2022 0830   GLUCOSE 171 (H) 06/15/2022 0835   BUN 16 06/15/2022 0835   BUN 13 05/05/2022 1230   CREATININE 0.90 06/15/2022 0835   CREATININE 0.92 12/08/2015 0848   CALCIUM 7.7 (L) 06/15/2022 0830   GFRNONAA >60 06/15/2022 0830   GFRAA >60 04/02/2019 0901   Estimated Creatinine Clearance: 82.6  mL/min (by C-G formula based on SCr of 0.9 mg/dL).  COAG Lab Results  Component Value Date   INR 2.0 (H) 06/15/2022   INR 1.5 (H) 05/26/2022   INR 1.7 (H) 05/25/2022     Non-Invasive Vascular  Imaging:  FINDINGS: Included chest demonstrates atherosclerosis of aorta without significant aneurysm. No aortic dissection, mediastinal hemorrhage or hematoma. Postop changes from coronary bypass. Native coronary atherosclerosis and aortic valve calcifications present. Included central pulmonary arteries are patent. Bovine arch anatomy appearing patent and tortuosity. Included lungs demonstrate no acute airspace process. Basilar atelectasis. Pleural thickening noted on the left. No acute osseous finding.   Left upper extremity: left subclavian, vertebral, and axillary arteries are patent. There is acute occlusion of the high left brachial artery at the level of the left humeral neck. Below this the left brachial artery is non-opacified. Limited assessment of the more peripheral left upper extremities because of the proximal brachial artery occlusion.   Review of the MIP images confirms the above findings.   IMPRESSION: Acute occlusion of the high left proximal brachial artery at the level of the left humeral neck.   These results were called by telephone at the time of interpretation on 06/15/2022 at 10:17 am to provider Dr. Atilano Median, who verbally acknowledged these results. 4      ASSESSMENT/PLAN:  Left UE ischemia with CTA evidence of acute occlusion of the high left proximal brachial artery at the level of the left humeral neck.  He has Afib managed on Eliquis with possible embolic event.    NON palpable/doppler signal left radial ulnar or palmer.   Weak brachial doppler signal, motor and gross sensation intact left UE  s/p cardiac cath 05/25/22 attempted radial approach and was unable to access the left radial artery. The right groin was prepped and draped in the usual manner.   NPO with plans to proceed to the OR for left UE thrombectomy.  Last dose of Eliquis last night 06/14/22.     Mosetta Pigeon 06/15/2022 9:56 AM  I have independently interviewed and  examined patient and agree with PA assessment and plan above.  CTA reviewed appears to have occlusion of the high brachial artery.  Plan will be for left upper extremity thromboembolectomy and angiography with possible stenting.  Hailey Stormer C. Randie Heinz, MD [facility] [place] Office: [phone] Pager: [phone]

## 2022-06-15 NOTE — ED Notes (Signed)
Pt will be transported to Short Stay 34 for surgery

## 2022-06-15 NOTE — Anesthesia Procedure Notes (Signed)
Procedure Name: Intubation Date/Time: 06/15/2022 12:20 PM  Performed by: Gus Puma, CRNAPre-anesthesia Checklist: Patient identified, Emergency Drugs available, Suction available and Patient being monitored Patient Re-evaluated:Patient Re-evaluated prior to induction Oxygen Delivery Method: Circle System Utilized Preoxygenation: Pre-oxygenation with 100% oxygen Induction Type: IV induction Ventilation: Mask ventilation without difficulty Laryngoscope Size: Glidescope and 3 Grade View: Grade I Tube type: Oral Tube size: 7.5 mm Number of attempts: 1 Airway Equipment and Method: Stylet and Oral airway Placement Confirmation: ETT inserted through vocal cords under direct vision, positive ETCO2 and breath sounds checked- equal and bilateral Secured at: 23 cm Tube secured with: Tape Dental Injury: Teeth and Oropharynx as per pre-operative assessment

## 2022-06-15 NOTE — Op Note (Signed)
Patient name: [name] MRN: [id number] DOB: February 15, 1950 Sex: male  06/15/2022 Pre-operative Diagnosis: Acute left upper extremity ischemia Post-operative diagnosis:  Same Surgeon:  Apolinar Junes C. Randie Heinz, MD Assistant: Nathanial Rancher, PA Procedure Performed: 1.  Left upper extremity thromboembolectomy via brachial and radial artery approach 2.  Left upper extremity angiogram with catheter selection of left subclavian and axillary arteries  Indications: 72 year old male with a today history of numbness and tingling of his left hand has actually regained feeling and full motor function in the hand but there are no signals at the level of the wrist he has a weak brachial signal at the elbow.  An experienced assistant was necessary to facilitate exposure of the radial and primary brachial arteries as well as pass catheters and wires and perform embolectomy.  Findings: There is a high takeoff of the radial artery in both the radial artery and vein brachial artery were exposed just below the antecubital.  Separate embolectomy of both the radial artery and large brachial artery were performed and there were solid and acute thrombus components retrieved and these were sent as specimen.  Angiography was performed with catheter selection of the subclavian and axillary arteries and this demonstrated flow to the ulnar artery being the main brachial channel and the radial artery was not easily identified because the sheath was occlusive.  At completion there were very strong radial and ulnar signals there did not appear to be a complete arch of these were separately obtained flow.   Procedure:  The patient was identified in the holding area and taken to the operating was placed supine on the table general anesthesia was induced.  He was sterilely prepped and draped in left upper extremity and chest in usual fashion, antibiotics were minister timeout was called.  Ultrasound was used to identify the main brachial  artery which was very much decompressed below the antecubitum and a longitudinal incision was made I dissected down identified what was likely a high takeoff of the radial artery which was very diminutive and deeper identified the brachial artery the patient was given 10,000's of heparin these vessels were encircled with Vesseloops.  I then made a transverse arteriotomy in the brachial artery and there was minimal bleeding where although some backbleeding.  I passed a 3 and 4 Fogarty proximally on 3 separate passes and returned solid appearing thrombus with acute clot although there is minimal acute clot there.  There is very strong inflow then.  There was good backbleeding I did not pass Fogarty distally given the minimal amount of clot that was in the arm.  I then closed the arteriotomy with 6-0 Prolene suture.  I then made a transverse arteriotomy in the radial artery which was much smaller and then passed a wire and placed a micropuncture sheath.  Attempted to perform angiography but could not get a catheter in the past.  I then passed the Glidewire higher up to the axillary and radial arteries and placed a 5 French sheath followed by a Kumpe catheter and then perform angiography which demonstrated acute occlusion of the smaller radial artery.  I then used an over-the-wire Fogarty and thrombectomized this and it was solid.  Thrombus that retrieved.  Unfortunately during this I lost my pulse my brachial artery.  After this I then reopened the brachial artery arteriotomy and was able to pass a 4 Fogarty and retrieved again solid appearing thrombus and this was sent as further specimen I had very strong inflow and we  closed the brachial artery with 6-0 Prolene suture.  I returned to the radial artery.  I placed a Kumpe catheter and performed angiography which demonstrated flow through the brachial artery down to the ulnar artery and failed the medial aspect of the hand but not the lateral nail with no complete  arch.  With this I then removed the sheath and there was very strong radial flow and good backbleeding from the distal radial artery this was closed with 6-0 Prolene suture.  There were strong signals in the radial and ulnar arteries at the wrist.  Satisfied with this we irrigated the wound and obtain hemostasis.  I did not reverse heparinization given underlying cause of thrombus likely being atrial fibrillation or other cardiac source.  We closed the wound in layers with Vicryl Monocryl and Dermabond is placed at the skin level.  Patient was awakened from anesthesia having tolerated procedure without any complication.  All counts were correct at completion.   EBL: 150cc  Contrast: 28cc   Raquel Racey C. Randie Heinz, MD [facility] [place] Office: [phone] Pager: [phone]

## 2022-06-15 NOTE — Code Documentation (Signed)
Mr. Yeicob Rodelo is a 72 yr old male arriving to [facility] via [facility] on 06/15/2022. He has a PMH of CHF, CAD, Valve replacement (AVR, MVR) 3rd degree HB with pacemaker, and prostate cancer. Pt is on Eliquis, last dose last night. Pt is from home where he was last known well last night at 2100, and this morning was found to have decreased sensation and circulation to left arm, and per EMS dysarthria.    Stroke team at bridge for pt arrival. Labs drawn, CBG obtained. Airway cleared by EDP. Pt to CT with team. NIHSS 2, left leg drift and decreased sensation on left arm. The following imaging was obtained: CT, CTA. Per Dr. Amada Jupiter, CT negative for hemorrhage and CTA neg for LVO.     Pt back to ED room 32 where his workup will continue. He will need q 2 hr VS and NIHSS for 12 hours, then q 4. Bedside handoff with Josh RN. Pt is ineligible for thrombolytics due to Eliquis, and OOW. He is not candidate for thrombectomy due to LVO negative.

## 2022-06-15 NOTE — Anesthesia Preprocedure Evaluation (Signed)
Anesthesia Evaluation  Patient identified by MRN, date of birth, ID band Patient awake    Reviewed: Allergy & Precautions, H&P , NPO status , Patient's Chart, lab work & pertinent test results  Airway Mallampati: II   Neck ROM: full    Dental   Pulmonary former smoker   breath sounds clear to auscultation       Cardiovascular + CAD, + Past MI, + CABG and + Peripheral Vascular Disease  + dysrhythmias + pacemaker + Valvular Problems/Murmurs  Rhythm:irregular Rate:Normal  S/p AVR, MVR, CABG 2020.   Neuro/Psych    GI/Hepatic ,GERD  ,,  Endo/Other    Renal/GU      Musculoskeletal   Abdominal   Peds  Hematology   Anesthesia Other Findings   Reproductive/Obstetrics                             Anesthesia Physical Anesthesia Plan  ASA: 3  Anesthesia Plan: General   Post-op Pain Management:    Induction: Intravenous  PONV Risk Score and Plan: 2 and Ondansetron, Dexamethasone and Treatment may vary due to age or medical condition  Airway Management Planned: Oral ETT  Additional Equipment:   Intra-op Plan:   Post-operative Plan: Extubation in OR  Informed Consent: I have reviewed the patients History and Physical, chart, labs and discussed the procedure including the risks, benefits and alternatives for the proposed anesthesia with the patient or authorized representative who has indicated his/her understanding and acceptance.     Dental advisory given  Plan Discussed with: CRNA, Anesthesiologist and Surgeon  Anesthesia Plan Comments:        Anesthesia Quick Evaluation

## 2022-06-15 NOTE — Transfer of Care (Signed)
Immediate Anesthesia Transfer of Care Note  Patient: [name]  Procedure(s) Performed: LEFT UPPER EXTREMITY THROMBECTOMY (Left: Arm Upper)  Patient Location: PACU  Anesthesia Type:General  Level of Consciousness: drowsy and patient cooperative  Airway & Oxygen Therapy: Patient Spontanous Breathing and Patient connected to face mask oxygen  Post-op Assessment: Report given to RN, Post -op Vital signs reviewed and stable, and Patient moving all extremities X 4  Post vital signs: Reviewed and stable  Last Vitals:  Vitals Value Taken Time  BP 109/79 06/15/22 1418  Temp    Pulse 75 06/15/22 1418  Resp 26 06/15/22 1419  SpO2 97 % 06/15/22 1418  Vitals shown include unvalidated device data.  Last Pain:  Vitals:   06/15/22 1049  TempSrc: Oral  PainSc:          Complications: No notable events documented.

## 2022-06-16 ENCOUNTER — Ambulatory Visit (HOSPITAL_COMMUNITY): Admit: 2022-06-16 | Payer: Medicare HMO | Admitting: Cardiology

## 2022-06-16 ENCOUNTER — Inpatient Hospital Stay (HOSPITAL_COMMUNITY): Payer: Medicare HMO

## 2022-06-16 ENCOUNTER — Inpatient Hospital Stay (HOSPITAL_COMMUNITY): Payer: Medicare HMO | Admitting: Certified Registered Nurse Anesthetist

## 2022-06-16 ENCOUNTER — Encounter (HOSPITAL_COMMUNITY)
Admission: EM | Disposition: A | Discharge status: Home Health | Payer: Self-pay | Source: Home / Self Care | Attending: Internal Medicine

## 2022-06-16 ENCOUNTER — Encounter (HOSPITAL_COMMUNITY): Payer: Self-pay | Admitting: Vascular Surgery

## 2022-06-16 DIAGNOSIS — I252 Old myocardial infarction: Secondary | ICD-10-CM | POA: Diagnosis not present

## 2022-06-16 DIAGNOSIS — I361 Nonrheumatic tricuspid (valve) insufficiency: Secondary | ICD-10-CM

## 2022-06-16 DIAGNOSIS — I998 Other disorder of circulatory system: Secondary | ICD-10-CM | POA: Diagnosis not present

## 2022-06-16 DIAGNOSIS — I509 Heart failure, unspecified: Secondary | ICD-10-CM

## 2022-06-16 DIAGNOSIS — I70208 Unspecified atherosclerosis of native arteries of extremities, other extremity: Secondary | ICD-10-CM | POA: Diagnosis not present

## 2022-06-16 DIAGNOSIS — I480 Paroxysmal atrial fibrillation: Secondary | ICD-10-CM | POA: Diagnosis not present

## 2022-06-16 DIAGNOSIS — D6869 Other thrombophilia: Secondary | ICD-10-CM

## 2022-06-16 DIAGNOSIS — I2510 Atherosclerotic heart disease of native coronary artery without angina pectoris: Secondary | ICD-10-CM

## 2022-06-16 DIAGNOSIS — Z87891 Personal history of nicotine dependence: Secondary | ICD-10-CM

## 2022-06-16 DIAGNOSIS — Z951 Presence of aortocoronary bypass graft: Secondary | ICD-10-CM

## 2022-06-16 DIAGNOSIS — Z952 Presence of prosthetic heart valve: Secondary | ICD-10-CM

## 2022-06-16 DIAGNOSIS — Z9889 Other specified postprocedural states: Secondary | ICD-10-CM

## 2022-06-16 DIAGNOSIS — I342 Nonrheumatic mitral (valve) stenosis: Secondary | ICD-10-CM

## 2022-06-16 HISTORY — PX: TEE WITHOUT CARDIOVERSION: SHX5443

## 2022-06-16 LAB — CBC
HCT: 31.9 % — ABNORMAL LOW (ref 39.0–52.0)
Hemoglobin: 9.7 g/dL — ABNORMAL LOW (ref 13.0–17.0)
MCH: 24.8 pg — ABNORMAL LOW (ref 26.0–34.0)
MCHC: 30.4 g/dL (ref 30.0–36.0)
MCV: 81.6 fL (ref 80.0–100.0)
Platelets: 58 10*3/uL — ABNORMAL LOW (ref 150–400)
RBC: 3.91 MIL/uL — ABNORMAL LOW (ref 4.22–5.81)
RDW: 18.5 % — ABNORMAL HIGH (ref 11.5–15.5)
WBC: 12.8 10*3/uL — ABNORMAL HIGH (ref 4.0–10.5)
nRBC: 0.3 % — ABNORMAL HIGH (ref 0.0–0.2)

## 2022-06-16 LAB — BASIC METABOLIC PANEL
Anion gap: 10 (ref 5–15)
BUN: 19 mg/dL (ref 8–23)
CO2: 18 mmol/L — ABNORMAL LOW (ref 22–32)
Calcium: 8.2 mg/dL — ABNORMAL LOW (ref 8.9–10.3)
Chloride: 103 mmol/L (ref 98–111)
Creatinine, Ser: 1.21 mg/dL (ref 0.61–1.24)
GFR, Estimated: >60 mL/min (ref >60)
Glucose, Bld: 184 mg/dL — ABNORMAL HIGH (ref 70–99)
Potassium: 4.3 mmol/L (ref 3.5–5.1)
Sodium: 131 mmol/L — ABNORMAL LOW (ref 135–145)

## 2022-06-16 LAB — SURGICAL PATHOLOGY

## 2022-06-16 LAB — APTT: aPTT: 39 seconds — ABNORMAL HIGH (ref 24–36)

## 2022-06-16 SURGERY — ECHOCARDIOGRAM, TRANSESOPHAGEAL
Anesthesia: Monitor Anesthesia Care

## 2022-06-16 MED ORDER — PROPOFOL 10 MG/ML IV BOLUS
INTRAVENOUS | Status: DC | PRN
  Administered 2022-06-16: 30 mg via INTRAVENOUS

## 2022-06-16 MED ORDER — VANCOMYCIN HCL 2000 MG/400ML IV SOLN
2000.0000 mg | Freq: Once | INTRAVENOUS | Status: AC
  Administered 2022-06-16: 2000 mg via INTRAVENOUS
  Filled 2022-06-16: qty 400

## 2022-06-16 MED ORDER — SODIUM CHLORIDE 0.9 % IV SOLN: INTRAVENOUS | Status: DC | PRN

## 2022-06-16 MED ORDER — SODIUM CHLORIDE 0.9 % IV SOLN
INTRAVENOUS | Status: DC
  Administered 2022-06-16: 20 mL/h via INTRAVENOUS

## 2022-06-16 MED ORDER — PROPOFOL 500 MG/50ML IV EMUL
INTRAVENOUS | Status: DC | PRN
  Administered 2022-06-16: 50 ug/kg/min via INTRAVENOUS

## 2022-06-16 MED ORDER — LIDOCAINE 2% (20 MG/ML) 5 ML SYRINGE
INTRAMUSCULAR | Status: DC | PRN
  Administered 2022-06-16: 60 mg via INTRAVENOUS

## 2022-06-16 MED ORDER — VANCOMYCIN HCL 1750 MG/350ML IV SOLN: 1750.0000 mg | INTRAVENOUS | Status: DC

## 2022-06-16 MED ORDER — SODIUM CHLORIDE 0.9 % IV SOLN
2.0000 g | INTRAVENOUS | Status: DC
  Administered 2022-06-16: 2 g via INTRAVENOUS
  Filled 2022-06-16: qty 20

## 2022-06-16 NOTE — Progress Notes (Signed)
Initial Nutrition Assessment  DOCUMENTATION CODES:   Not applicable  INTERVENTION:  Liberalize diet to regular and keep 2L fluid restriction Encourage PO intake Ordering assistance Monitor for need for additional nutrition supplements.   NUTRITION DIAGNOSIS:  Increased nutrient needs related to acute illness as evidenced by estimated needs.  GOAL:  Patient will meet greater than or equal to 90% of their needs  MONITOR:  PO intake, Supplement acceptance, Weight trends, Labs  REASON FOR ASSESSMENT:  Malnutrition Screening Tool    ASSESSMENT:   Pt with hx of CAD (s/p CABG x2), HLD, GERD, and hx prostate cancer presented to ED from outside hospital with decreased sensation and numbness to the left arm. Pt found to have acute occlusion of the high left brachial artery  5/7 - left upper extremity thromboembolectomy  5/8 - TEE, large mitral valve vegetation seen  Pt unavailable x2 two attempted visits. Reviewed chart and noted a 6.3% weight loss noted over the last month. However, unsure if true weight loss as pt does have a cardiac hx and is on dieretics. Will follow-up on weight hx and nutrition exam at in person assessment. Pt underwent TEE yesterday which did show vegetation, cardiology following.   Pt with good intake of meals so far. Reviewed labs and lipid panels have been normal for several years. Liberalized to regular to allow more dining choices. Did leave 2L fluid restriction as pt is being diuresed and Na was low this AM.   Will monitor and determine if pt needs additional supplements to meet his estimated nutrition needs.   Average Meal Intake: 5/9: 100% average intake x 1 recorded meals  Nutritionally Relevant Medications: Scheduled Meds:  atorvastatin  20 mg Oral Daily   digoxin  0.125 mg Oral Daily   docusate sodium  100 mg Oral BID   empagliflozin  10 mg Oral QAC breakfast   furosemide  20 mg Oral BID   pantoprazole  40 mg Oral Daily   Continuous  Infusions:  cefTRIAXone (ROCEPHIN)  IV Stopped (06/16/22 1836)   vancomycin     PRN Meds: alum & mag hydroxide-simeth, bisacodyl, ondansetron, phenol, senna-docusate  Labs Reviewed from 5/8 Na 131  NUTRITION - FOCUSED PHYSICAL EXAM: Defer to in-person assessment  Diet Order:   Diet Order             Diet regular Room service appropriate? Yes with Assist; Fluid consistency: Thin; Fluid restriction: 2000 mL Fluid  Diet effective now                   EDUCATION NEEDS:  No education needs have been identified at this time  Skin:  Skin Assessment: Reviewed RN Assessment  Last BM:  5/7  Height:  Ht Readings from Last 1 Encounters:  06/15/22 5\' 7"  (1.702 m)    Weight:  Wt Readings from Last 1 Encounters:  06/15/22 94.8 kg    Ideal Body Weight:  67.3 kg  BMI:  Body mass index is 32.73 kg/m.  Estimated Nutritional Needs:  Kcal:  1900-2100 kcal/d Protein:  95-110 g/d Fluid:  >/=2L/d    Greig Castilla, RD, LDN Clinical Dietitian RD pager # available in [facility]  After hours/weekend pager # available in [facility]

## 2022-06-16 NOTE — Plan of Care (Signed)
  Problem: Education: Goal: Knowledge of General Education information will improve Description Including pain rating scale, medication(s)/side effects and non-pharmacologic comfort measures Outcome: Progressing   Problem: Health Behavior/Discharge Planning: Goal: Ability to manage health-related needs will improve Outcome: Progressing   

## 2022-06-16 NOTE — CV Procedure (Signed)
   TRANSESOPHAGEAL ECHOCARDIOGRAM GUIDED DIRECT CURRENT CARDIOVERSION  NAME:  [name]    MRN: [id number] DOB:  [birth year]    ADMIT DATE: 06/15/2022  INDICATIONS: Evaluate for LV thrombus Evaluate prosthetic mitral valve / aortic valve  PROCEDURE:   Informed consent was obtained prior to the procedure. The risks, benefits and alternatives for the procedure were discussed and the patient comprehended these risks.  Risks include, but are not limited to, cough, sore throat, vomiting, nausea, somnolence, esophageal and stomach trauma or perforation, bleeding, low blood pressure, aspiration, pneumonia, infection, trauma to the teeth and death.    After a procedural time-out, the oropharynx was anesthetized and the patient was sedated by the anesthesia service. The transesophageal probe was inserted in the esophagus and stomach without difficulty and multiple views were obtained. Sedation by anesthesia  COMPLICATIONS:    Complications: No complications Patient tolerated procedure well.  KEY FINDINGS:  Left ventricle: normal function. Limited visualization of the LV apex, however, no obvious thrombus noted.  Right ventricle: Moderate to severely dilated with moderately reduced function.  Mitral valve: 27mm Medtronic bioprosthetic valve in the mitral position. There is a large vegetation encompassing a majority of the valve leaflets. There is mild mitral stenosis. The valve is otherwise well seated with no regurgitation.  Aortic valve: 23mm Edwards bovine valve present (10/11/18). The aortic valve leaflets appear thickened with a small subcentimeter vegetation seen on limited views at the aortic aspect of the valve. The valve is otherwise well seated with no paravalvular or valvular regurgitation. No signs of aortic root abscess or pathology at the aortomitral curtain.  Full report to follow.   [name] Advanced Heart Failure 11:35 AM

## 2022-06-16 NOTE — Anesthesia Preprocedure Evaluation (Signed)
Anesthesia Evaluation  Patient identified by MRN, date of birth, ID band Patient awake    Reviewed: Allergy & Precautions, NPO status , Patient's Chart, lab work & pertinent test results  Airway Mallampati: II  TM Distance: >3 FB Neck ROM: Full    Dental no notable dental hx.    Pulmonary former smoker   Pulmonary exam normal        Cardiovascular + CAD, + Past MI, + CABG, + Peripheral Vascular Disease and +CHF  Normal cardiovascular exam+ pacemaker + Valvular Problems/Murmurs (s/p MVR, AVR)   ECHO:  1. Right ventricular systolic function is moderately reduced. The right  ventricular size is severely enlarged.   2. Left ventricular ejection fraction, by estimation, is 55 to 60%. The  left ventricle has normal function. The left ventricle has no regional  wall motion abnormalities. Left ventricular diastolic parameters are  indeterminate. There is the  interventricular septum is flattened in systole, consistent with right  ventricular pressure overload and the interventricular septum is flattened  in diastole ('D' shaped left ventricle), consistent with right ventricular  volume overload.   3. The mitral valve has been repaired/replaced. Trivial mitral valve  regurgitation. 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   4. Tricuspid valve regurgitation is severe.   5. The aortic valve has been repaired/replaced. Aortic valve  regurgitation is not visualized. Echo findings are consistent with normal  structure and function of the aortic valve prosthesis. Aortic valve mean  gradient measures 7.0 mmHg. Aortic valve Vmax   measures 1.78 m/s. DI 0.5.   6. Left atrial size was severely dilated.   7. Right atrial size was severely dilated.     Neuro/Psych negative neurological ROS  negative psych ROS   GI/Hepatic negative GI ROS, Neg liver ROS,,,  Endo/Other  negative  endocrine ROS    Renal/GU negative Renal ROS     Musculoskeletal negative musculoskeletal ROS (+)    Abdominal  (+) + obese  Peds  Hematology  (+) Blood dyscrasia (Eliquis), anemia   Anesthesia Other Findings   Reproductive/Obstetrics                             Anesthesia Physical Anesthesia Plan  ASA: 4  Anesthesia Plan: MAC   Post-op Pain Management:    Induction: Intravenous  PONV Risk Score and Plan: 1 and Propofol infusion and Treatment may vary due to age or medical condition  Airway Management Planned: Nasal Cannula  Additional Equipment:   Intra-op Plan:   Post-operative Plan:   Informed Consent: I have reviewed the patients History and Physical, chart, labs and discussed the procedure including the risks, benefits and alternatives for the proposed anesthesia with the patient or authorized representative who has indicated his/her understanding and acceptance.     Dental advisory given  Plan Discussed with: CRNA  Anesthesia Plan Comments:        Anesthesia Quick Evaluation

## 2022-06-16 NOTE — Progress Notes (Addendum)
Progress Note    06/16/2022 8:47 AM 1 Day Post-Op  Subjective:  denies any pain.  Says he had a pain pill earlier and says it feels like it stuck in his throat.  He is not having any trouble breathing/swallowing otherwise.  He says sometimes at night, he has trouble clearing mucous.   Afebrile HR 80's-100's  90's-130's systolic 95% RA  Vitals:   06/16/22 0600 06/16/22 0744  BP:  125/83  Pulse: 87 87  Resp: 19 20  Temp:  97.8 F (36.6 C)  SpO2: 94% 91%    Physical Exam: General:  no distress Cardiac:  regular Lungs:  non labored Incisions:  clean with ecchymosis and moderate hematoma left arm Extremities:  brisk doppler flow left radial and ulnar arteries.  Motor and sensory are in tact   CBC    Component Value Date/Time   WBC 14.0 (H) 06/15/2022 2242   RBC 4.01 (L) 06/15/2022 2242   HGB 10.1 (L) 06/15/2022 2242   HGB 11.8 (L) 05/05/2022 1230   HCT 32.0 (L) 06/15/2022 2242   HCT 37.2 (L) 05/05/2022 1230   PLT 84 (L) 06/15/2022 2242   PLT 210 05/05/2022 1230   MCV 79.8 (L) 06/15/2022 2242   MCV 82 05/05/2022 1230   MCH 25.2 (L) 06/15/2022 2242   MCHC 31.6 06/15/2022 2242   RDW 18.6 (H) 06/15/2022 2242   RDW 14.5 05/05/2022 1230   LYMPHSABS 0.5 (L) 06/15/2022 0845   LYMPHSABS 0.8 05/05/2022 1230   MONOABS 1.0 06/15/2022 0845   EOSABS 0.0 06/15/2022 0845   EOSABS 0.0 05/05/2022 1230   BASOSABS 0.1 06/15/2022 0845   BASOSABS 0.0 05/05/2022 1230    BMET    Component Value Date/Time   NA 131 (L) 06/16/2022 0130   NA 137 05/05/2022 1230   K 4.3 06/16/2022 0130   CL 103 06/16/2022 0130   CO2 18 (L) 06/16/2022 0130   GLUCOSE 184 (H) 06/16/2022 0130   BUN 19 06/16/2022 0130   BUN 13 05/05/2022 1230   CREATININE 1.21 06/16/2022 0130   CREATININE 0.92 12/08/2015 0848   CALCIUM 8.2 (L) 06/16/2022 0130   GFRNONAA >60 06/16/2022 0130   GFRAA >60 04/02/2019 0901    INR    Component Value Date/Time   INR 1.6 (H) 06/15/2022 2242     Intake/Output  Summary (Last 24 hours) at 06/16/2022 0847 Last data filed at 06/16/2022 0318 Gross per 24 hour  Intake 1033.27 ml  Output 650 ml  Net 383.27 ml      Assessment/Plan:  72 y.o. male is s/p:  1.  Left upper extremity thromboembolectomy via brachial and radial artery approach 2.  Left upper extremity angiogram with catheter selection of left subclavian and axillary arteries  1 Day Post-Op   -pt with brisk doppler flow left radial and ulnar arteries.  Motor and sensory in tact.   -pt with moderate hematoma left arm-pt feels it is unchanged from last evening and he is not having any pain.   -DVT prophylaxis:  heparin gtt -neurology ordered MRI-pt has PPM and rep is coming.  They are trying to coordinate his echo and MRI today.   Doreatha Massed, PA-C [facility] [phone] 06/16/2022 8:47 AM  I have independently interviewed and examined patient and agree with PA assessment and plan above.  Left hand well-perfused with moderate hematoma to be expected given continue anticoagulation following surgery.  No need for hematoma evacuation at this time but will watch closely.  Cardiology planning  TEE appreciate their assistance.  Dshaun Reppucci C. Randie Heinz, MD [facility] [place] Office: [phone] Pager: [phone]

## 2022-06-16 NOTE — Anesthesia Postprocedure Evaluation (Signed)
Anesthesia Post Note  Patient: [name]  Procedure(s) Performed: LEFT UPPER EXTREMITY THROMBECTOMY (Left: Arm Upper)     Patient location during evaluation: PACU Anesthesia Type: General Level of consciousness: awake and alert Pain management: pain level controlled Vital Signs Assessment: post-procedure vital signs reviewed and stable Respiratory status: spontaneous breathing, nonlabored ventilation, respiratory function stable and patient connected to nasal cannula oxygen Cardiovascular status: blood pressure returned to baseline and stable Postop Assessment: no apparent nausea or vomiting Anesthetic complications: no   No notable events documented.  Last Vitals:  Vitals:   06/16/22 0600 06/16/22 0744  BP:  125/83  Pulse: 87 87  Resp: 19 20  Temp:  36.6 C  SpO2: 94% 91%    Last Pain:  Vitals:   06/16/22 0744  TempSrc: Oral  PainSc:                  Naziah Weckerly S

## 2022-06-16 NOTE — Interval H&P Note (Signed)
History and Physical Interval Note:  06/16/2022 11:35 AM  [name]  has presented today for surgery, with the diagnosis of HEART FAILURE.  The various methods of treatment have been discussed with the patient and family. After consideration of risks, benefits and other options for treatment, the patient has consented to  Procedure(s): TRANSESOPHAGEAL ECHOCARDIOGRAM (N/A) as a surgical intervention.  The patient's history has been reviewed, patient examined, no change in status, stable for surgery.  I have reviewed the patient's chart and labs.  Questions were answered to the patient's satisfaction.     [name]

## 2022-06-16 NOTE — Consult Note (Addendum)
Initial Consultation Note   [name] HYQ:[id number] DOB: 1950-04-04 DOA: 06/15/2022  PCP: [name], MD   Patient coming from: Home  Chief Complaint: Left arm pain and heaviness  Reason for consult: Take over as primary team, address multiple medical issues, vascular surgery to continue to follow, but urgent vascular issues have been addressed.  Requesting provider: Lemar Livings, MD  HPI: [name] is a 72 y.o. male with medical history significant of atrial fibrillation, peripheral arterial disease, CAD status post CABG, complete heart block status post pacemaker, obesity, aortic valve replacement, mitral valve replacement, hyperlipidemia, prostate cancer, CHF presenting with left arm pain and heaviness on the morning of 06/15/2022.  He reported sudden onset of left arm pain and heaviness with some minimal improvement since it started.  No other obvious focal neurologic deficits he for like maybe he had some slurred speech.  He additionally reported a month of intermittent night sweats.  He reports having dental work performed recently for which she did take prophylactic amoxicillin.  Subjective today: Arm feels improved after intervention by vascular surgery.    He denies fevers, chills, chest pain, shortness of breath, abdominal pain, constipation, diarrhea, nausea, vomiting.  ED/Hospital Course: Vital signs thus far notable for heart rate in the 80s to 100s, blood pressure in the 100s to 120s systolic.  Lab workup has shown BMP today with sodium 131, chloride bicarb 18, glucose 184.  LFTs yesterday showed protein 6.3, albumin 2.6, T. bili 1.7.  CBC yesterday showed CBC with leukocytosis improving from 18-14, hemoglobin stable at 10, platelets stable for the last several weeks around 84.  PT and INR elevated at 19.2 and 1.6 respectively.  PTT elevated to 39.  Digoxin level low at 0.5, ethanol negative, MRSA negative, surgical pathology and blood cultures pending.  CT head  performed in the ED showed no acute normality and CTA of the head and neck showed patent vasculature.  CTA of the left upper extremity showed acute occlusion of the high left proximal brachial artery.  In the ED neurology was consulted and based on workup above felt patient's arm symptoms were most likely secondary to the arterial occlusion but did recommend MRI to rule out any additional embolic events considering possible embolic source of arterial occlusion.  Vascular surgery consulted and took patient to thrombectomy yesterday.   Following his initial workup and intervention cardiology was consulted as he has significant cardiac history most recently with echo and 2024 showing EF 55-60%, enlarged RV with evidence of overload leading to severe tricuspid regurgitation and moderately reduced systolic function of the RV.  Cardiology has seen the patient previously and referred him to advanced heart failure due to these new findings.  Advanced heart failure has seen the patient in the past month outpatient and started patient on digoxin and Jardiance with recommendation for TEE.  With current presentation patient was taken for TEE earlier today and noted to have a large mitral valve vegetation.  Blood cultures have been obtained and are pending.  TRH consulted to take over as primary given patient's multiple medical problems and current primary team, vascular surgery, will continue to follow but have addressed his urgent vascular issue.  Review of Systems: As per HPI otherwise all other systems reviewed and are negative.  Past Medical History:  Diagnosis Date   BCE (basal cell epithelioma), arm    RIGHT SHOULDER   Bradycardia    Bumps on skin    on left side of nose for  last 3 weeks   CAD (coronary artery disease)    a. s/p CABGx2 10/11/18 LIMA to LAD, SVG to RCA, EVH via right thigh.   Dyslipidemia    Ectatic thoracic aorta (HCC)    Esophageal stricture    GERD (gastroesophageal reflux disease)     Heart murmur    sees dr Rose Fillers   Hyperlipidemia    Incidental pulmonary nodule, > 3mm and < 8mm 10/04/2018   Noted on CTA   Inguinal hernia    bilateral   Obesity    Presence of permanent cardiac pacemaker    Prostate cancer (HCC)    S/P aortic valve replacement with bioprosthetic valve 10/11/2018   23 mm Edwards Inspiris Resilia stented bovine pericardial tissue valve, miltral valve done also   S/P CABG x 2 10/11/2018   LIMA to LAD, SVG to RCA, EVH via right thigh   S/P mitral valve replacement with bioprosthetic valve 10/11/2018   27 mm Medtronic Mosaic stented porcine bioprosthetic tissue valve   S/P patent foramen ovale closure 10/11/2018   S/P placement of cardiac pacemaker    Smoker     Past Surgical History:  Procedure Laterality Date   AORTIC VALVE REPLACEMENT N/A 10/11/2018   Procedure: AORTIC VALVE REPLACEMENT (AVR) with 23 Inspiris Bioprosthetic Aortic valve.;  Surgeon: Purcell Nails, MD;  Location: [facility] OR;  Service: Open Heart Surgery;  Laterality: N/A;   AV FISTULA PLACEMENT Left 06/15/2022   Procedure: LEFT UPPER EXTREMITY THROMBECTOMY;  Surgeon: Maeola Harman, MD;  Location: [facility] OR;  Service: Vascular;  Laterality: Left;   CLIPPING OF ATRIAL APPENDAGE N/A 10/11/2018   Procedure: CLIPPING OF LEFT ATRIAL APPENDAGE with 45 AtriCure Clip.;  Surgeon: Purcell Nails, MD;  Location: [facility] OR;  Service: Open Heart Surgery;  Laterality: N/A;   COLONOSCOPY  03/2008,2004   Dr. Madilyn Fireman   CORONARY ARTERY BYPASS GRAFT N/A 10/11/2018   Procedure: CORONARY ARTERY BYPASS GRAFTING (CABG) x 2 using LIMA to the LAD and endoscopic greater saphenous vein harvesting to the RCA.;  Surgeon: Purcell Nails, MD;  Location: [facility] OR;  Service: Open Heart Surgery;  Laterality: N/A;   CYSTOSCOPY  04/04/2019   Procedure: CYSTOSCOPY;  Surgeon: Rene Paci, MD;  [facility];  Service: Urology;;   HERNIA REPAIR  12/11/10   BIH   MITRAL VALVE REPLACEMENT N/A  10/11/2018   Procedure: MITRAL VALVE (MV) REPLACEMENT with 27 Mosaic Bioprosthetic Mitral valve.;  Surgeon: Purcell Nails, MD;  Location: [facility] OR;  Service: Open Heart Surgery;  Laterality: N/A;   PACEMAKER IMPLANT N/A 10/17/2018   Procedure: PACEMAKER IMPLANT;  Surgeon: Hillis Range, MD;  Location: [facility] INV[facility]IVE [facility] LAB;  Service: Cardiovascular;  Laterality: N/A;   RADIOACTIVE SEED IMPLANT N/A 04/04/2019   Procedure: RADIOACTIVE SEED IMPLANT/BRACHYTHERAPY IMPLANT, cystoscopy;  Surgeon: Rene Paci, MD;  Location: [facility];  Service: Urology;  Laterality: N/A;  58 seeds   REPAIR OF PATENT FORAMEN OVALE N/A 10/11/2018   Procedure: CLOSURE OF PATENT FORAMEN OVALE;  Surgeon: Purcell Nails, MD;  Location: [facility] OR;  Service: Open Heart Surgery;  Laterality: N/A;   RIGHT/LEFT HEART CATH AND CORONARY ANGIOGRAPHY N/A 09/15/2018   Procedure: RIGHT/LEFT HEART CATH AND CORONARY ANGIOGRAPHY;  Surgeon: Kathleene Hazel, MD;  Location: [facility] LAB;  Service: Cardiovascular;  Laterality: N/A;   RIGHT/LEFT HEART CATH AND CORONARY/GRAFT ANGIOGRAPHY N/A 05/25/2022   Procedure: RIGHT/LEFT HEART CATH AND CORONARY/GRAFT ANGIOGRAPHY;  Surgeon: Kathleene Hazel, MD;  Location: [facility] LAB;  Service: Cardiovascular;  Laterality: N/A;   TEE WITHOUT CARDIOVERSION N/A 12/12/2015   Procedure: TRANSESOPHAGEAL ECHOCARDIOGRAM (TEE);  Surgeon: Thurmon Fair, MD;  Location: [facility] ENDOSCOPY;  Service: Cardiovascular;  Laterality: N/A;   TEE WITHOUT CARDIOVERSION N/A 10/11/2018   Procedure: TRANSESOPHAGEAL ECHOCARDIOGRAM (TEE);  Surgeon: Purcell Nails, MD;  Location: [facility] OR;  Service: Open Heart Surgery;  Laterality: N/A;   THYMECTOMY  1976   radaiation tx done    Social History  reports that he quit smoking about 3 years ago. His smoking use included cigarettes. He has a 8.75 pack-year smoking history. He has never used smokeless tobacco. He reports current alcohol use of about 1.0  standard drink of alcohol per week. He reports that he does not use drugs.  No Known Allergies  Family History  Problem Relation Age of Onset   Colon cancer Mother    Lung cancer Father        smoker   CAD Neg Hx    Pancreatic cancer Neg Hx    Breast cancer Neg Hx   Reviewed on admission  Prior to Admission medications   Medication Sig Start Date End Date Taking? Authorizing Provider  acetaminophen (TYLENOL) 325 MG tablet Take 650 mg by mouth as needed for moderate pain.   Yes [provider]  apixaban (ELIQUIS) 5 MG TABS tablet Take 1 tablet (5 mg total) by mouth 2 (two) times daily. 05/26/22  Yes Kathlen Mody, MD  atorvastatin (LIPITOR) 20 MG tablet Take 1 tablet (20 mg total) by mouth daily. 05/18/22  Yes [name], MD  digoxin (LANOXIN) 0.125 MG tablet Take 1 tablet (0.125 mg total) by mouth daily. 06/03/22  Yes Sabharwal, Aditya, DO  empagliflozin (JARDIANCE) 10 MG TABS tablet Take 1 tablet (10 mg total) by mouth daily before breakfast. 06/03/22  Yes Sabharwal, Aditya, DO  furosemide (LASIX) 20 MG tablet Take 20 mg by mouth 2 (two) times daily.   Yes [provider]  Iron, Ferrous Sulfate, 325 (65 Fe) MG TABS Take 1 tablet by mouth 2 (two) times daily. Patient taking differently: Take 1 tablet by mouth daily. 05/26/22  Yes Kathlen Mody, MD  metoprolol tartrate (LOPRESSOR) 25 MG tablet Take 1.5 tablets (37.5 mg total) by mouth 2 (two) times daily. 06/09/22  Yes Milford, Anderson Malta, FNP  oxymetazoline (AFRIN) 0.05 % nasal spray Place 1 spray into both nostrils at bedtime as needed for congestion.   Yes [provider]  polyethylene glycol powder (GLYCOLAX/MIRALAX) 17 GM/SCOOP powder Take 17 g by mouth daily as needed for mild constipation. 05/26/22  Yes Kathlen Mody, MD    Physical Exam: Vitals:   06/16/22 1200 06/16/22 1205 06/16/22 1212 06/16/22 1216  BP: 105/66 115/67 118/68 107/67  Pulse: 83 83 83 83  Resp: 20 (!) 25 (!) 25 (!) 22  Temp:       TempSrc:      SpO2: (!) 89% 92% 91% 91%  Weight:      Height:        Physical Exam Constitutional:      General: He is not in acute distress.    Appearance: Normal appearance.  HENT:     Head: Normocephalic and atraumatic.     Mouth/Throat:     Mouth: Mucous membranes are moist.     Pharynx: Oropharynx is clear.  Eyes:     Extraocular Movements: Extraocular movements intact.     Pupils: Pupils are equal, round, and reactive to  light.  Cardiovascular:     Rate and Rhythm: Normal rate and regular rhythm.     Pulses: Normal pulses.     Heart sounds: Murmur heard.  Pulmonary:     Effort: Pulmonary effort is normal. No respiratory distress.     Breath sounds: Normal breath sounds.  Abdominal:     General: Bowel sounds are normal. There is no distension.     Palpations: Abdomen is soft.     Tenderness: There is no abdominal tenderness.  Musculoskeletal:        General: Swelling (mild, distal left arm) present. No deformity.  Skin:    General: Skin is warm and dry.  Neurological:     General: No focal deficit present.     Mental Status: Mental status is at baseline.    Labs on Admission: I have personally reviewed following labs and imaging studies  CBC: Recent Labs  Lab 06/09/22 1552 06/15/22 0835 06/15/22 0845 06/15/22 2242  WBC 14.7*  --  18.1* 14.0*  NEUTROABS  --   --  16.3*  --   HGB 12.2* 11.9* 10.4* 10.1*  HCT 37.1* 35.0* 34.8* 32.0*  MCV 77.8*  --  83.7 79.8*  PLT 87*  --  79* 84*    Basic Metabolic Panel: Recent Labs  Lab 06/09/22 1552 06/15/22 0830 06/15/22 0835 06/15/22 2242 06/16/22 0130  NA 130* 134* 135 131* 131*  K 3.5 3.3* 3.5 4.2 4.3  CL 99 106 106 101 103  CO2 21* 14*  --  19* 18*  GLUCOSE 119* 165* 171* 190* 184*  BUN 14 14 16 18 19   CREATININE 0.98 1.02 0.90 1.08 1.21  CALCIUM 8.5* 7.7*  --  8.3* 8.2*  MG 2.2  --   --   --   --     GFR: Estimated Creatinine Clearance: 61.5 mL/min (by C-G formula based on SCr of 1.21  mg/dL).  Liver Function Tests: Recent Labs  Lab 06/09/22 1552 06/15/22 0830 06/15/22 2242  AST 25 26 25   ALT 15 12 14   ALKPHOS 81 73 74  BILITOT 2.1* 2.0* 1.7*  PROT 7.3 5.9* 6.3*  ALBUMIN 2.8* 2.3* 2.6*    Urine analysis:    Component Value Date/Time   COLORURINE YELLOW 10/09/2018 1544   APPEARANCEUR CLEAR 10/09/2018 1544   LABSPEC 1.015 10/09/2018 1544   PHURINE 5.0 10/09/2018 1544   GLUCOSEU NEGATIVE 10/09/2018 1544   HGBUR NEGATIVE 10/09/2018 1544   BILIRUBINUR NEGATIVE 10/09/2018 1544   BILIRUBINUR neg 03/25/2015 1053   KETONESUR NEGATIVE 10/09/2018 1544   PROTEINUR NEGATIVE 10/09/2018 1544   UROBILINOGEN 4.0 03/25/2015 1053   NITRITE NEGATIVE 10/09/2018 1544   LEUKOCYTESUR NEGATIVE 10/09/2018 1544    Radiological Exams on Admission: EP STUDY  Result Date: 06/16/2022 See surgical note for result.  HYBRID OR IMAGING ([facility] ONLY)  Result Date: 06/15/2022 There is no interpretation for this exam.  This order is for images obtained during a surgical procedure.  Please See "Surgeries" Tab for more information regarding the procedure.   CT ANGIO UP EXTREM LEFT W &/OR WO CONTAST  Result Date: 06/15/2022 CLINICAL DATA:  Acute aortic syndrome, left arm weakness, diminished pulses EXAM: CT ANGIOGRAPHY OF THE LEFT UPPEREXTREMITY TECHNIQUE: Multidetector CT imaging of the LEFT upperwas performed using the standard protocol during bolus administration of intravenous contrast. Multiplanar CT image reconstructions and MIPs were obtained to evaluate the vascular anatomy. RADIATION DOSE REDUCTION: This exam was performed according to the departmental dose-optimization program which includes automated exposure  control, adjustment of the mA and/or kV according to patient size and/or use of iterative reconstruction technique. CONTRAST:  OMNIPAQUE IOHEXOL 350 MG/ML SOLN COMPARISON:  None Available. FINDINGS: Included chest demonstrates atherosclerosis of aorta without significant  aneurysm. No aortic dissection, mediastinal hemorrhage or hematoma. Postop changes from coronary bypass. Native coronary atherosclerosis and aortic valve calcifications present. Included central pulmonary arteries are patent. Bovine arch anatomy appearing patent and tortuosity. Included lungs demonstrate no acute airspace process. Basilar atelectasis. Pleural thickening noted on the left. No acute osseous finding. Left upper extremity: left subclavian, vertebral, and axillary arteries are patent. There is acute occlusion of the high left brachial artery at the level of the left humeral neck. Below this the left brachial artery is non-opacified. Limited assessment of the more peripheral left upper extremities because of the proximal brachial artery occlusion. Review of the MIP images confirms the above findings. IMPRESSION: Acute occlusion of the high left proximal brachial artery at the level of the left humeral neck. These results were called by telephone at the time of interpretation on 06/15/2022 at 10:17 am to provider Dr. Atilano Median, who verbally acknowledged these results. 4 Electronically Signed   By: Judie Petit.  Shick M.D.   On: 06/15/2022 10:19   CT HEAD CODE STROKE WO CONTRAST  Result Date: 06/15/2022 CLINICAL DATA:  Code stroke.  Left arm weakness and slurred speech. EXAM: CT ANGIOGRAPHY HEAD AND NECK TECHNIQUE: Multidetector CT imaging of the head and neck was performed using the standard protocol during bolus administration of intravenous contrast. Multiplanar CT image reconstructions and MIPs were obtained to evaluate the vascular anatomy. Carotid stenosis measurements (when applicable) are obtained utilizing NASCET criteria, using the distal internal carotid diameter as the denominator. RADIATION DOSE REDUCTION: This exam was performed according to the departmental dose-optimization program which includes automated exposure control, adjustment of the mA and/or kV according to patient size and/or use of  iterative reconstruction technique. CONTRAST:  OMNIPAQUE IOHEXOL 350 MG/ML SOLN COMPARISON:  None Available. FINDINGS: CT HEAD FINDINGS Brain: There is no acute intracranial hemorrhage, extra-axial fluid collection, or acute infarct. Parenchymal volume is normal. The ventricles are normal in size. Bochicchio-white differentiation is preserved. The pituitary and suprasellar region are normal. There is no mass lesion. There is no mass effect or midline shift. Vascular: No hyperdense vessel is seen. There is a small calcification overlying the right temporal lobe which may reflect a vascular calcification of indeterminate age. Skull: Normal. Negative for fracture or focal lesion. Sinuses/Orbits: The paranasal sinuses are clear. The globes and orbits are unremarkable. Other: None. ASPECTS [facility] Stroke Program Early CT Score) - Ganglionic level infarction (caudate, lentiform nuclei, internal capsule, insula, M1-M3 cortex): 7 - Supraganglionic infarction (M4-M6 cortex): 3 Total score (0-10 with 10 being normal): 10 CTA NECK FINDINGS Aortic arch: There is calcified plaque in the imaged aortic arch. The origins of the major branch vessels are patent. The subclavian arteries are patent to the level imaged. Right carotid system: The right common, internal, and external carotid arteries are patent, without hemodynamically significant stenosis or occlusion. There is no evidence of dissection or aneurysm. Left carotid system: The left common, internal, and external carotid arteries are patent, with minimal plaque of the bifurcation but no significant stenosis or occlusion. There is no evidence of dissection or aneurysm. Vertebral arteries: The vertebral arteries are patent, without hemodynamically significant stenosis or occlusion. There is no evidence of dissection or aneurysm. Skeleton: There is no acute osseous abnormality or suspicious osseous lesion. There  is no visible canal hematoma. Other neck: The soft tissues of the  neck are unremarkable. Upper chest: Assessed on the separately dictated CTA chest. Review of the MIP images confirms the above findings CTA HEAD FINDINGS Anterior circulation: The intracranial ICAs are patent, with minimal plaque but no hemodynamically significant stenosis or occlusion. The bilateral MCAs are patent, without proximal stenosis or occlusion. The bilateral ACAs are patent, without proximal stenosis or occlusion. There is focal calcification and a right M3 branch without occlusion (6-167). The anterior communicating artery is normal. There is no aneurysm or AVM. Posterior circulation: The bilateral V4 segments are patent. The basilar artery is patent. The major cerebellar arteries appear patent. The bilateral PCAs are patent, without proximal stenosis or occlusion. The right PCA is primarily supplied by the posterior communicating artery with a diminutive P1 segment (fetal origin). A small left posterior communicating artery is also identified. There is no aneurysm or AVM. Venous sinuses: Not well assessed due to bolus timing. Anatomic variants: As above. Review of the MIP images confirms the above findings IMPRESSION: 1. No acute intracranial pathology. 2. Patent vasculature of the head and neck with no hemodynamically significant stenosis or occlusion. Findings communicated to Dr Amada Jupiter via Loretha Stapler at 8:40am. The CTA results were communicated at 8:54 am. Electronically Signed   By: Lesia Hausen M.D.   On: 06/15/2022 09:20   CT ANGIO HEAD NECK W WO CM (CODE STROKE)  Result Date: 06/15/2022 CLINICAL DATA:  Code stroke.  Left arm weakness and slurred speech. EXAM: CT ANGIOGRAPHY HEAD AND NECK TECHNIQUE: Multidetector CT imaging of the head and neck was performed using the standard protocol during bolus administration of intravenous contrast. Multiplanar CT image reconstructions and MIPs were obtained to evaluate the vascular anatomy. Carotid stenosis measurements (when applicable) are obtained  utilizing NASCET criteria, using the distal internal carotid diameter as the denominator. RADIATION DOSE REDUCTION: This exam was performed according to the departmental dose-optimization program which includes automated exposure control, adjustment of the mA and/or kV according to patient size and/or use of iterative reconstruction technique. CONTRAST:  OMNIPAQUE IOHEXOL 350 MG/ML SOLN COMPARISON:  None Available. FINDINGS: CT HEAD FINDINGS Brain: There is no acute intracranial hemorrhage, extra-axial fluid collection, or acute infarct. Parenchymal volume is normal. The ventricles are normal in size. Tomer-white differentiation is preserved. The pituitary and suprasellar region are normal. There is no mass lesion. There is no mass effect or midline shift. Vascular: No hyperdense vessel is seen. There is a small calcification overlying the right temporal lobe which may reflect a vascular calcification of indeterminate age. Skull: Normal. Negative for fracture or focal lesion. Sinuses/Orbits: The paranasal sinuses are clear. The globes and orbits are unremarkable. Other: None. ASPECTS [facility] Stroke Program Early CT Score) - Ganglionic level infarction (caudate, lentiform nuclei, internal capsule, insula, M1-M3 cortex): 7 - Supraganglionic infarction (M4-M6 cortex): 3 Total score (0-10 with 10 being normal): 10 CTA NECK FINDINGS Aortic arch: There is calcified plaque in the imaged aortic arch. The origins of the major branch vessels are patent. The subclavian arteries are patent to the level imaged. Right carotid system: The right common, internal, and external carotid arteries are patent, without hemodynamically significant stenosis or occlusion. There is no evidence of dissection or aneurysm. Left carotid system: The left common, internal, and external carotid arteries are patent, with minimal plaque of the bifurcation but no significant stenosis or occlusion. There is no evidence of dissection or aneurysm.  Vertebral arteries: The vertebral arteries are patent, without  hemodynamically significant stenosis or occlusion. There is no evidence of dissection or aneurysm. Skeleton: There is no acute osseous abnormality or suspicious osseous lesion. There is no visible canal hematoma. Other neck: The soft tissues of the neck are unremarkable. Upper chest: Assessed on the separately dictated CTA chest. Review of the MIP images confirms the above findings CTA HEAD FINDINGS Anterior circulation: The intracranial ICAs are patent, with minimal plaque but no hemodynamically significant stenosis or occlusion. The bilateral MCAs are patent, without proximal stenosis or occlusion. The bilateral ACAs are patent, without proximal stenosis or occlusion. There is focal calcification and a right M3 branch without occlusion (6-167). The anterior communicating artery is normal. There is no aneurysm or AVM. Posterior circulation: The bilateral V4 segments are patent. The basilar artery is patent. The major cerebellar arteries appear patent. The bilateral PCAs are patent, without proximal stenosis or occlusion. The right PCA is primarily supplied by the posterior communicating artery with a diminutive P1 segment (fetal origin). A small left posterior communicating artery is also identified. There is no aneurysm or AVM. Venous sinuses: Not well assessed due to bolus timing. Anatomic variants: As above. Review of the MIP images confirms the above findings IMPRESSION: 1. No acute intracranial pathology. 2. Patent vasculature of the head and neck with no hemodynamically significant stenosis or occlusion. Findings communicated to Dr Amada Jupiter via Loretha Stapler at 8:40am. The CTA results were communicated at 8:54 am. Electronically Signed   By: Lesia Hausen M.D.   On: 06/15/2022 09:20    EKG: Independently reviewed.  Most recent EKG was yesterday and this showed what appears to be atrial sensed ventricular paced rhythm at 117  bpm.  Assessment/Plan Principal Problem:   Ischemia of left upper extremity Active Problems:   History of mitral valve replacement   H/O aortic valve replacement   Arterial occlusion   Brachial artery occlusion, left (HCC)   Paroxysmal A-fib (HCC)   Secondary hypercoagulable state (HCC)   History of mitral valve repair   Aortic valve vegetation Mitral valve vegetation History of mitral valve replacement Left brachial artery occlusion > Patient present with left arm pain and heaviness as per HPI.  Workup has been above showed acute occlusion of his left proximal brachial artery. > Seen and admitted by vascular surgery who performed thrombectomy with improvement in his left upper extremity. > Neurology also consulted and recommending MRI to rule out any additional embolism to the brain and to reconsult if needed/positive. > Cardiology consulted given history of cardiac issues including right heart failure with right heart enlargement and severe tricuspid regurgitation. > Patient taken for TEE earlier today which showed moderate to severely dilated RV with moderately reduced systolic function.  Bioprosthetic mitral valve with a large vegetation, bovine aortic valve with small subcentimeter vegetation. > Blood cultures have been obtained.  Has received perioperative cefazolin and cefuroxime but is not on scheduled antibiotics.  Afebrile, leukocytosis with current trend 18, 14 will need repeat labs today. - Continue to monitor on progressive unit - Appreciate vascular surgery and cardiology recommendations - ID consulted by Cardiology per chart - Considered holding antibiotics for now given surgical pathology for embolus shows clot only, but ID has ordered for empiric Vanc and Ceft pending cultures. - Follow-up blood cultures - Trend fever curve and WBC - MRI to rule out embolic CVA/TIA - Continue heparin for now  Right-sided CHF Status post aortic valve replacement Status post mitral  valve replacement Status post PFO closure > TEE today, not yet fully  read but did show above mitral valve vegetation and small aortic valve vegetation..  Recent echo earlier this year did show EF 55-60%, enlarged RV with evidence of overload and moderately reduced systolic function likely leading to observed severe tricuspid regurgitation. > Has seen cardiology and advanced heart failure.  Cardiology/advanced heart failure following while inpatient. > Recently started on digoxin and Jardiance.  Digoxin level on admission 0.5. - Continue with digoxin, Lasix, metoprolol, Jardiance  Atrial fibrillation > Recent change from warfarin to Eliquis.  Has been taking Eliquis regularly.  Developed vegetation and arterial thrombosis as above. > Pathology shows clot only as above.  May need to switch back to previous anticoagulation, warfarin, considering recent change Eliquis prior to this event. - Appreciate cardiology recommendations - Continue with heparin as above - Continue home metoprolol  Hyponatremia > Mild hyponatremia 131.Was normal on admission.  Has been n.p.o. will monitor response to restarting diet. - Trend renal function and electrolytes  CAD > Status post CABG - Continue home atorvastatin, metoprolol - On heparin as above  Complete heart block > Status post pacemaker.  Paced rhythm observed thus far. - Noted  Hyperlipidemia - Continue home atorvastatin  History of prostate cancer - Noted  Obesity - Noted  DVT prophylaxis: Heparin Code Status:   Full Family Communication:  Updated at bedside Disposition Plan:   Patient is from:  Home  Anticipated DC to:  Pending clinical course  Anticipated DC date:  2 to 7 days  Anticipated DC barriers: Pending results of culture data and stability with his large mitral valve vegetation  Consults called:  Patient has been seen by vascular surgery who are following, cardiology/heart failure who are following, neurology who is signed  off to be reconsulted if MRI positive. Admission status:  Inpatient, progressive  Severity of Illness: The appropriate patient status for this patient is INPATIENT. Inpatient status is judged to be reasonable and necessary in order to provide the required intensity of service to ensure the patient's safety. The patient's presenting symptoms, physical exam findings, and initial radiographic and laboratory data in the context of their chronic comorbidities is felt to place them at high risk for further clinical deterioration. Furthermore, it is not anticipated that the patient will be medically stable for discharge from the hospital within 2 midnights of admission.   * I certify that at the point of admission it is my clinical judgment that the patient will require inpatient hospital care spanning beyond 2 midnights from the point of admission due to high intensity of service, high risk for further deterioration and high frequency of surveillance required.Synetta Fail MD [facility]  How to contact the [facility] Attending or Consulting provider 7A - 7P or covering provider during after hours 7P -7A, for this patient?   Check the care team in [facility] and look for a) attending/consulting [facility] provider listed and b) the [facility] team listed Log into [email or web address] and use [facility] universal password to access. If you do not have the password, please contact the hospital operator. Locate the [facility] provider you are looking for under [facility] and page to a number that you can be directly reached. If you still have difficulty reaching the provider, please page the [facility] (Director on Call) for the Hospitalists listed on amion for assistance.  06/16/2022, 12:27 PM

## 2022-06-16 NOTE — Transfer of Care (Signed)
Immediate Anesthesia Transfer of Care Note  Patient: [name]  Procedure(s) Performed: TRANSESOPHAGEAL ECHOCARDIOGRAM  Patient Location: Cath Lab  Anesthesia Type:MAC  Level of Consciousness: drowsy, patient cooperative, and responds to stimulation  Airway & Oxygen Therapy: Patient Spontanous Breathing  Post-op Assessment: Report given to RN, Post -op Vital signs reviewed and stable, and Patient moving all extremities X 4  Post vital signs: Reviewed and stable  Last Vitals:  Vitals Value Taken Time  BP 94/71   Temp    Pulse 76   Resp 12   SpO2 92     Last Pain:  Vitals:   06/16/22 1000  TempSrc:   PainSc: 0-No pain      Patients Stated Pain Goal: 0 (06/15/22 2045)  Complications: No notable events documented.

## 2022-06-16 NOTE — Progress Notes (Signed)
Pharmacy Antibiotic Note  [name] is a 72 y.o. male admitted on 06/15/2022 with possible endocarditis of the  prosthetic mitral and aortic valves. Blood cultures sent today. Pharmacy has been consulted for vancomycin dosing. SCr today is 1.21 with CrCl ~ 61.5 ml/min.   Plan: Vancomycin 2000 mg x 1 then 1750 mg every 24 hours Predicted AUC 525 with SCr 1.21 and Vd- 0.72  Also on ceftriaxone 2 gm IV every 24 hours Monitor blood cultures, renal function, and clinical status   Height: 5\' 7"  (170.2 cm) Weight: 94.8 kg (209 lb) IBW/kg (Calculated) : 66.1  Temp (24hrs), Avg:97.6 F (36.4 C), Min:97.3 F (36.3 C), Max:98.1 F (36.7 C)  Recent Labs  Lab 06/09/22 1552 06/15/22 0830 06/15/22 0835 06/15/22 0845 06/15/22 2242 06/16/22 0130 06/16/22 1309  WBC 14.7*  --   --  18.1* 14.0*  --  12.8*  CREATININE 0.98 1.02 0.90  --  1.08 1.21  --     Estimated Creatinine Clearance: 61.5 mL/min (by C-G formula based on SCr of 1.21 mg/dL).    No Known Allergies  Antimicrobials this admission: 5/8 Vanc> 5/8 ceftriaxone>   Microbiology results: 5/8 BCx:    Thank you for allowing pharmacy to be a part of this patient's care.  Sharin Mons, PharmD, BCPS, BCIDP Infectious Diseases Clinical Pharmacist Phone: [phone] 06/16/2022 2:49 PM

## 2022-06-16 NOTE — Anesthesia Postprocedure Evaluation (Signed)
Anesthesia Post Note  Patient: BRADON KARMAN  Procedure(s) Performed: TRANSESOPHAGEAL ECHOCARDIOGRAM     Patient location during evaluation: PACU Anesthesia Type: MAC Level of consciousness: awake and alert Pain management: pain level controlled Vital Signs Assessment: post-procedure vital signs reviewed and stable Respiratory status: spontaneous breathing, nonlabored ventilation, respiratory function stable and patient connected to nasal cannula oxygen Cardiovascular status: stable and blood pressure returned to baseline Postop Assessment: no apparent nausea or vomiting Anesthetic complications: no  No notable events documented.  Last Vitals:  Vitals:   06/16/22 1212 06/16/22 1216  BP: 118/68 107/67  Pulse: 83 83  Resp: (!) 25 (!) 22  Temp:    SpO2: 91% 91%    Last Pain:  Vitals:   06/16/22 1000  TempSrc:   PainSc: 0-No pain                 Shelton Silvas

## 2022-06-16 NOTE — Progress Notes (Signed)
Patient seen following TEE. Concern for MV vegetation and possible subcentimeter vegetation on the AoV.  Also has PPM in place but no obvious vegetations seen on leads but if blood cultures positive, this may need to be re-addressed.  ID consulted and blood cultures obtained. Appreciate IM assistance in managing. Cardiology will continue to follow and see formally tomorrow. Fortunately, he is currently compensated from a HF standpoint.  Laurance Flatten, MD

## 2022-06-16 NOTE — Progress Notes (Signed)
  Echocardiogram Echocardiogram Transesophageal has been performed.  [name] 06/16/2022, 11:52 AM

## 2022-06-17 DIAGNOSIS — I351 Nonrheumatic aortic (valve) insufficiency: Secondary | ICD-10-CM

## 2022-06-17 DIAGNOSIS — I480 Paroxysmal atrial fibrillation: Secondary | ICD-10-CM | POA: Diagnosis not present

## 2022-06-17 DIAGNOSIS — I38 Endocarditis, valve unspecified: Secondary | ICD-10-CM

## 2022-06-17 DIAGNOSIS — Z952 Presence of prosthetic heart valve: Secondary | ICD-10-CM | POA: Diagnosis not present

## 2022-06-17 DIAGNOSIS — I70208 Unspecified atherosclerosis of native arteries of extremities, other extremity: Secondary | ICD-10-CM | POA: Diagnosis not present

## 2022-06-17 DIAGNOSIS — I6310 Cerebral infarction due to embolism of unspecified precerebral artery: Secondary | ICD-10-CM

## 2022-06-17 DIAGNOSIS — I340 Nonrheumatic mitral (valve) insufficiency: Secondary | ICD-10-CM

## 2022-06-17 DIAGNOSIS — B952 Enterococcus as the cause of diseases classified elsewhere: Secondary | ICD-10-CM

## 2022-06-17 DIAGNOSIS — I339 Acute and subacute endocarditis, unspecified: Secondary | ICD-10-CM

## 2022-06-17 DIAGNOSIS — I998 Other disorder of circulatory system: Secondary | ICD-10-CM | POA: Diagnosis not present

## 2022-06-17 DIAGNOSIS — I2510 Atherosclerotic heart disease of native coronary artery without angina pectoris: Secondary | ICD-10-CM

## 2022-06-17 DIAGNOSIS — I330 Acute and subacute infective endocarditis: Secondary | ICD-10-CM | POA: Diagnosis not present

## 2022-06-17 DIAGNOSIS — I639 Cerebral infarction, unspecified: Secondary | ICD-10-CM

## 2022-06-17 DIAGNOSIS — I509 Heart failure, unspecified: Secondary | ICD-10-CM

## 2022-06-17 DIAGNOSIS — Z8673 Personal history of transient ischemic attack (TIA), and cerebral infarction without residual deficits: Secondary | ICD-10-CM

## 2022-06-17 LAB — BLOOD CULTURE ID PANEL (REFLEXED) - BCID2

## 2022-06-17 LAB — BASIC METABOLIC PANEL
Anion gap: 12 (ref 5–15)
BUN: 24 mg/dL — ABNORMAL HIGH (ref 8–23)
CO2: 16 mmol/L — ABNORMAL LOW (ref 22–32)
Calcium: 8.4 mg/dL — ABNORMAL LOW (ref 8.9–10.3)
Chloride: 103 mmol/L (ref 98–111)
Creatinine, Ser: 1.09 mg/dL (ref 0.61–1.24)
GFR, Estimated: >60 mL/min (ref >60)
Glucose, Bld: 159 mg/dL — ABNORMAL HIGH (ref 70–99)
Potassium: 4.1 mmol/L (ref 3.5–5.1)
Sodium: 131 mmol/L — ABNORMAL LOW (ref 135–145)

## 2022-06-17 LAB — CULTURE, BLOOD (ROUTINE X 2)

## 2022-06-17 LAB — APTT: aPTT: 33 seconds (ref 24–36)

## 2022-06-17 LAB — HEPARIN LEVEL (UNFRACTIONATED): Heparin Unfractionated: 0.47 IU/mL (ref 0.30–0.70)

## 2022-06-17 MED ORDER — SODIUM CHLORIDE 0.9 % IV SOLN
2.0000 g | INTRAVENOUS | Status: DC
  Administered 2022-06-17 – 2022-06-22 (×29): 2 g via INTRAVENOUS
  Filled 2022-06-17 (×29): qty 2000

## 2022-06-17 MED ORDER — SODIUM CHLORIDE 0.9 % IV SOLN
2.0000 g | Freq: Two times a day (BID) | INTRAVENOUS | Status: DC
  Administered 2022-06-17 – 2022-06-22 (×11): 2 g via INTRAVENOUS
  Filled 2022-06-17 (×11): qty 20

## 2022-06-17 MED ORDER — SODIUM CHLORIDE 0.9 % IV SOLN
2.0000 g | Freq: Four times a day (QID) | INTRAVENOUS | Status: DC
  Administered 2022-06-17: 2 g via INTRAVENOUS
  Filled 2022-06-17 (×2): qty 2000

## 2022-06-17 NOTE — Assessment & Plan Note (Addendum)
-   2/3 bottles positive from admission - Repeat blood cultures on 06/18/2022 -See endocarditis - per ID: "Continue ampicillin and ceftriaxone for IE coverage x 6 weeks from negative Cx on 06/18/22, then suppressive amox 500 mg PO bid after that for lifelong "

## 2022-06-17 NOTE — Consult Note (Addendum)
[facility]    Date of Admission:  06/15/2022   Total days of inpatient antibiotics 3        Reason for Consult: Faecalis bacteremia    Principal Problem:   Ischemia of left upper extremity Active Problems:   H/O aortic valve replacement   History of mitral valve replacement   Arterial occlusion   Brachial artery occlusion, left (HCC)   Paroxysmal A-fib (HCC)   Secondary hypercoagulable state (HCC)   History of mitral valve repair   Assessment: 72 year old male with bioprosthetic AVR and by bioprosthetic MVR admitted with acute thrombus to left lower extremity requiring urgent surgery found to have E faecalis bacteremia:   #E faecalis bacteremia with 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  He had a crown done on Monday.  He reports that he noticed when he woke up on Tuesday his left arm felt swollen almost like he slept on it. - Patient underwent left upper extremity thrombectomy via brachial radial artery and angiogram on 5/7 with Dr. Randie Heinz, vascular surgery - Cardiology engaged for TEE which showed large vegetation encompassing majority of mitral valve leaflet, aortic bovine valve with small subcentimeter vegetation.   Recommendations:  -Continue ampicillin and ceftriaxone - Repeat blood cultures tomorrow - Await final TEE read, given a faecalis and blood cultures and emboli to the left arm this is consistent with endocarditis.  Given that patient has not been feeling well since February his clinical picture is consistent with subacute endocarditis which is seen with a faecalis.  Of note, patient developed left arm emboli after chronic, I wonder if that  was the inciting event to worsen his clinical course. - Colonoscopy can be done outpatient given E faecalis - Engage cardiothoracic surgery  I have personally spent 85 minutes involved in face-to-face and non-face-to-face activities for this patient on the day of the visit. Professional time spent includes the following activities: Preparing to see the patient (review of tests), Obtaining and/or reviewing separately obtained history (admission/discharge record), Performing a medically appropriate examination and/or evaluation , Ordering medications/tests/procedures, referring and communicating with other health care professionals, Documenting clinical information in the EMR, Independently interpreting results (not separately reported), Communicating results to the patient/family/caregiver, Counseling and educating the patient/family/caregiver and Care coordination (not separately reported).   Microbiology:   Antibiotics: Cefazolin 5/7 Vanc 5/08 CTX 5/08 AMP 5/9  Cultures: Blood 5/8 2/2 E faecalis   HPI: [name] is a 72 y.o. male with history of CAD status post CABG x 2 and left atrial complaining, from a valve closure status post aortic and mitral bioprosthetic valve repair and pacemaker, a flutter, tobacco abuse, hyperlipidemia admitted on 06/15/2022 due to acute thrombus to left upper extremity status post urgent surgery per Dr. Randie Heinz, vascular surgery. He presented to ED via EMS as code stroke.  He had woken up with left arm numbness, code stroke called.  Patient taken to the OR for thrombolectomy.  ID engaged a TEE showed mitral aortic valve bioprosthetic vegetation.  Review of Systems: Review of Systems  All other systems reviewed and are negative.   Past Medical History:  Diagnosis Date   BCE (basal cell epithelioma), arm    RIGHT SHOULDER  Bradycardia    Bumps on skin    on left side of nose for last 3 weeks   CAD (coronary artery disease)    a. s/p CABGx2 10/11/18 LIMA to  LAD, SVG to RCA, EVH via right thigh.   Dyslipidemia    Ectatic thoracic aorta (HCC)    Esophageal stricture    GERD (gastroesophageal reflux disease)    Heart murmur    sees dr Rose Fillers   Hyperlipidemia    Incidental pulmonary nodule, > 3mm and < 8mm 10/04/2018   Noted on CTA   Inguinal hernia    bilateral   Obesity    Presence of permanent cardiac pacemaker    Prostate cancer (HCC)    S/P aortic valve replacement with bioprosthetic valve 10/11/2018   23 mm Edwards Inspiris Resilia stented bovine pericardial tissue valve, miltral valve done also   S/P CABG x 2 10/11/2018   LIMA to LAD, SVG to RCA, EVH via right thigh   S/P mitral valve replacement with bioprosthetic valve 10/11/2018   27 mm Medtronic Mosaic stented porcine bioprosthetic tissue valve   S/P patent foramen ovale closure 10/11/2018   S/P placement of cardiac pacemaker    Smoker     Social History   Tobacco Use   Smoking status: Former    Packs/day: 0.25    Years: 35.00    Additional pack years: 0.00    Total pack years: 8.75    Types: Cigarettes    Quit date: 09/04/2018    Years since quitting: 3.7   Smokeless tobacco: Never  Vaping Use   Vaping Use: Never used  Substance Use Topics   Alcohol use: Yes    Alcohol/week: 1.0 standard drink of alcohol    Types: 1 Cans of beer per week    Comment: rare   Drug use: No    Family History  Problem Relation Age of Onset   Colon cancer Mother    Lung cancer Father        smoker   CAD Neg Hx    Pancreatic cancer Neg Hx    Breast cancer Neg Hx    Scheduled Meds:  atorvastatin  20 mg Oral Daily   digoxin  0.125 mg Oral Daily   docusate sodium  100 mg Oral BID   empagliflozin  10 mg Oral QAC breakfast   furosemide  20 mg Oral BID   metoprolol tartrate  37.5 mg Oral BID   pantoprazole  40 mg Oral Daily   sodium chloride flush  3 mL Intravenous Once   sodium chloride flush  3 mL Intravenous Q12H   Continuous Infusions:  sodium chloride     sodium chloride  Stopped (06/16/22 1826)   ampicillin (OMNIPEN) IV 2 g (06/17/22 1156)   cefTRIAXone (ROCEPHIN)  IV 2 g (06/17/22 1009)   heparin 700 Units/hr (06/17/22 1015)   PRN Meds:.sodium chloride, sodium chloride, acetaminophen **OR** acetaminophen, alum & mag hydroxide-simeth, bisacodyl, guaiFENesin-dextromethorphan, metoprolol tartrate, morphine injection, ondansetron, phenol, senna-docusate, sodium chloride flush, traMADol No Known Allergies  OBJECTIVE: Blood pressure 101/72, pulse 84, temperature (!) 97.5 F (36.4 C), temperature source Oral, resp. rate 20, height 5\' 7"  (1.702 m), weight 94.8 kg, SpO2 95 %.  Physical Exam Constitutional:      General: He is not in acute distress.    Appearance: He is normal weight. He is not toxic-appearing.  HENT:     Head: Normocephalic and atraumatic.     Right Ear: External ear normal.  Left Ear: External ear normal.     Nose: No congestion or rhinorrhea.     Mouth/Throat:     Mouth: Mucous membranes are moist.     Pharynx: Oropharynx is clear.  Eyes:     Extraocular Movements: Extraocular movements intact.     Conjunctiva/sclera: Conjunctivae normal.     Pupils: Pupils are equal, round, and reactive to light.  Cardiovascular:     Rate and Rhythm: Normal rate and regular rhythm.     Heart sounds: No murmur heard.    No friction rub. No gallop.  Pulmonary:     Effort: Pulmonary effort is normal.     Breath sounds: Normal breath sounds.  Abdominal:     General: Abdomen is flat. Bowel sounds are normal.     Palpations: Abdomen is soft.  Musculoskeletal:        General: No swelling. Normal range of motion.     Cervical back: Normal range of motion and neck supple.  Skin:    General: Skin is warm and dry.  Neurological:     General: No focal deficit present.     Mental Status: He is oriented to person, place, and time.  Psychiatric:        Mood and Affect: Mood normal.     Lab Results Lab Results  Component Value Date   WBC 12.8 (H)  06/16/2022   HGB 9.7 (L) 06/16/2022   HCT 31.9 (L) 06/16/2022   MCV 81.6 06/16/2022   PLT 58 (L) 06/16/2022    Lab Results  Component Value Date   CREATININE 1.09 06/17/2022   BUN 24 (H) 06/17/2022   NA 131 (L) 06/17/2022   K 4.1 06/17/2022   CL 103 06/17/2022   CO2 16 (L) 06/17/2022    Lab Results  Component Value Date   ALT 14 06/15/2022   AST 25 06/15/2022   ALKPHOS 74 06/15/2022   BILITOT 1.7 (H) 06/15/2022       Danelle Earthly, MD [facility] 06/17/2022, 12:34 PM

## 2022-06-17 NOTE — Progress Notes (Signed)
ANTICOAGULATION CONSULT NOTE  Pharmacy Consult for heparin Indication: Left brachial artery occlusion and CVA  No Known Allergies  Patient Measurements: Height: 5\' 7"  (170.2 cm) Weight: 94.8 kg (209 lb) IBW/kg (Calculated) : 66.1 HEPARIN DW (KG): 86.3   Vital Signs: Temp: 97.5 F (36.4 C) (05/09 2121) Temp Source: Oral (05/09 2121) BP: 141/84 (05/09 2121) Pulse Rate: 95 (05/09 2121)  Labs: Recent Labs    06/15/22 0845 06/15/22 2242 06/16/22 0130 06/16/22 1309 06/17/22 0155 06/17/22 0843 06/17/22 2108  HGB 10.4* 10.1*  --  9.7*  --   --   --   HCT 34.8* 32.0*  --  31.9*  --   --   --   PLT 79* 84*  --  58*  --   --   --   APTT 43*  --  39*  --  33  --   --   LABPROT 22.8* 19.2*  --   --   --   --   --   INR 2.0* 1.6*  --   --   --   --   --   HEPARINUNFRC  --   --   --   --   --   --  0.47  CREATININE  --  1.08 1.21  --   --  1.09  --      Estimated Creatinine Clearance: 68.2 mL/min (by C-G formula based on SCr of 1.09 mg/dL).   Medical History: Past Medical History:  Diagnosis Date   BCE (basal cell epithelioma), arm    RIGHT SHOULDER   Bradycardia    Bumps on skin    on left side of nose for last 3 weeks   CAD (coronary artery disease)    a. s/p CABGx2 10/11/18 LIMA to LAD, SVG to RCA, EVH via right thigh.   Dyslipidemia    Ectatic thoracic aorta (HCC)    Esophageal stricture    GERD (gastroesophageal reflux disease)    Heart murmur    sees dr Rose Fillers   Hyperlipidemia    Incidental pulmonary nodule, > 3mm and < 8mm 10/04/2018   Noted on CTA   Inguinal hernia    bilateral   Obesity    Presence of permanent cardiac pacemaker    Prostate cancer (HCC)    S/P aortic valve replacement with bioprosthetic valve 10/11/2018   23 mm Edwards Inspiris Resilia stented bovine pericardial tissue valve, miltral valve done also   S/P CABG x 2 10/11/2018   LIMA to LAD, SVG to RCA, EVH via right thigh   S/P mitral valve replacement with bioprosthetic valve  10/11/2018   27 mm Medtronic Mosaic stented porcine bioprosthetic tissue valve   S/P patent foramen ovale closure 10/11/2018   S/P placement of cardiac pacemaker    Smoker     Medications:  Medications Prior to Admission  Medication Sig Dispense Refill Last Dose   acetaminophen (TYLENOL) 325 MG tablet Take 650 mg by mouth as needed for moderate pain.   Unk   apixaban (ELIQUIS) 5 MG TABS tablet Take 1 tablet (5 mg total) by mouth 2 (two) times daily. 60 tablet 1 06/14/2022 at 1900   atorvastatin (LIPITOR) 20 MG tablet Take 1 tablet (20 mg total) by mouth daily. 90 tablet 3 06/14/2022   digoxin (LANOXIN) 0.125 MG tablet Take 1 tablet (0.125 mg total) by mouth daily. 90 tablet 3 06/14/2022   empagliflozin (JARDIANCE) 10 MG TABS tablet Take 1 tablet (10 mg total) by mouth daily before  breakfast. 30 tablet 11 06/14/2022   furosemide (LASIX) 20 MG tablet Take 20 mg by mouth 2 (two) times daily.   06/14/2022   Iron, Ferrous Sulfate, 325 (65 Fe) MG TABS Take 1 tablet by mouth 2 (two) times daily. (Patient taking differently: Take 1 tablet by mouth daily.) 60 tablet 1 06/14/2022   metoprolol tartrate (LOPRESSOR) 25 MG tablet Take 1.5 tablets (37.5 mg total) by mouth 2 (two) times daily. 180 tablet 3 06/14/2022 at 1900   oxymetazoline (AFRIN) 0.05 % nasal spray Place 1 spray into both nostrils at bedtime as needed for congestion.      polyethylene glycol powder (GLYCOLAX/MIRALAX) 17 GM/SCOOP powder Take 17 g by mouth daily as needed for mild constipation. 238 g 0 Unk   Scheduled:   atorvastatin  20 mg Oral Daily   digoxin  0.125 mg Oral Daily   docusate sodium  100 mg Oral BID   empagliflozin  10 mg Oral QAC breakfast   furosemide  20 mg Oral BID   metoprolol tartrate  37.5 mg Oral BID   pantoprazole  40 mg Oral Daily   sodium chloride flush  3 mL Intravenous Once   sodium chloride flush  3 mL Intravenous Q12H    Assessment: 72 yo male with left brachial artery occlusion s/p thrombectomy. Initially started on  fixed dose heparin at 500 units/hr post-op per Vascular Surgery. He is noted with history of afib and was on apixaban PTA. Pharmacy consulted to dose heparin - dose ok to be titrated to heparin level goal 0.3-0.5 per MD on 5/9. MRI noted with cerebral infarcts - dosing with no bolus per stroke protocol.   PM update - heparin level now therapeutic at 0.47. Hg low stable, plt down to 58 (likely in setting of bacteremia/endocarditis). No bleeding or issues with infusion per discussion with RN.   Goal of Therapy:  Heparin level 0.3-0.5 units/ml Monitor platelets by anticoagulation protocol: Yes   Plan:  No heparin bolus Continue heparin at 700 units/hr Confirmatory heparin level with AM labs Monitor daily CBC, s/sx bleeding   Leia Alf, PharmD, BCPS Please check AMION for all [facility] contact numbers Clinical Pharmacist 06/17/2022 10:29 PM

## 2022-06-17 NOTE — Progress Notes (Signed)
PHARMACY - PHYSICIAN COMMUNICATION CRITICAL VALUE ALERT - BLOOD CULTURE IDENTIFICATION (BCID)  [name] is an 72 y.o. male who presented to [facility] on 06/15/2022 with a chief complaint of left arm pain and heaviness.   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 Now found to have enterococcus faecalis in 2/3 blood cultures.   Name of physician (or Provider) Contacted: Thedore Mins- ID provider [place]  Current antibiotics: Vanc/Ceftriaxone   Changes to prescribed antibiotics recommended:  Stop Vanc Add ampicillin 2 gm IV q 4 hours  Change ceftriaxone to 2 gm q 12 hours   Results for orders placed or performed during the hospital encounter of 06/15/22  Blood Culture ID Panel (Reflexed) (Collected: 06/16/2022 12:58 PM)  Result Value Ref Range   Enterococcus faecalis DETECTED (A) NOT DETECTED   Enterococcus Faecium NOT DETECTED NOT DETECTED   Listeria monocytogenes NOT DETECTED NOT DETECTED   Staphylococcus species NOT DETECTED NOT DETECTED   Staphylococcus aureus (BCID) NOT DETECTED NOT DETECTED   Staphylococcus epidermidis NOT DETECTED NOT DETECTED   Staphylococcus lugdunensis NOT DETECTED NOT DETECTED   Streptococcus species NOT DETECTED NOT DETECTED   Streptococcus agalactiae NOT DETECTED NOT DETECTED   Streptococcus pneumoniae NOT DETECTED NOT DETECTED   Streptococcus pyogenes NOT DETECTED NOT DETECTED   A.calcoaceticus-baumannii NOT DETECTED NOT DETECTED   Bacteroides fragilis NOT DETECTED NOT DETECTED   Enterobacterales NOT DETECTED NOT DETECTED   Enterobacter cloacae complex NOT DETECTED NOT DETECTED   Escherichia coli NOT DETECTED NOT DETECTED   Klebsiella aerogenes NOT DETECTED NOT DETECTED   Klebsiella oxytoca NOT DETECTED NOT DETECTED   Klebsiella pneumoniae NOT DETECTED NOT DETECTED   Proteus species NOT DETECTED NOT DETECTED   Salmonella species NOT DETECTED NOT DETECTED   Serratia marcescens NOT DETECTED NOT  DETECTED   Haemophilus influenzae NOT DETECTED NOT DETECTED   Neisseria meningitidis NOT DETECTED NOT DETECTED   Pseudomonas aeruginosa NOT DETECTED NOT DETECTED   Stenotrophomonas maltophilia NOT DETECTED NOT DETECTED   Candida albicans NOT DETECTED NOT DETECTED   Candida auris NOT DETECTED NOT DETECTED   Candida glabrata NOT DETECTED NOT DETECTED   Candida krusei NOT DETECTED NOT DETECTED   Candida parapsilosis NOT DETECTED NOT DETECTED   Candida tropicalis NOT DETECTED NOT DETECTED   Cryptococcus neoformans/gattii NOT DETECTED NOT DETECTED   Vancomycin resistance NOT DETECTED NOT DETECTED    Sharin Mons, PharmD, BCPS, BCIDP Infectious Diseases Clinical Pharmacist Phone: [phone] 06/17/2022  9:25 AM

## 2022-06-17 NOTE — Assessment & Plan Note (Addendum)
-   Patient presented with left arm pain and heaviness as per HPI.  Workup has been above showed acute occlusion of his left proximal brachial artery (now s/p thrombectomy on 06/15/2022) - vascular following -Patient transitioned back to Eliquis prior to discharge

## 2022-06-17 NOTE — Assessment & Plan Note (Addendum)
-   underwent cardiology eval for cardiac embolic source for LUE arterial thrombus - s/p TEE on 06/16/22 positive for AV and MV vegetations - ID also following - continue rocephin and amp as per ID - CTS recommends against any surgery.  Repeat echo in 2 weeks

## 2022-06-17 NOTE — Assessment & Plan Note (Addendum)
Scattered punctate acute to subacute bilateral cerebral infarcts primarily involving white matter noted on MRI brain performed 06/16/2022 -Suspect cardiac embolic source given clinical workup thus far (suspected septic emboli) - neurology aware - anticoag choices discussed with cardiology as well; plan for now is going back to Eliquis if still stable over the next 1-2 days

## 2022-06-17 NOTE — Progress Notes (Addendum)
  Progress Note    06/17/2022 8:06 AM 1 Day Post-Op  Subjective:  L forearm feels better this morning   Vitals:   06/17/22 0330 06/17/22 0746  BP: 123/68 122/73  Pulse: 85 61  Resp: 20 18  Temp: 97.9 F (36.6 C) (!) 97.5 F (36.4 C)  SpO2: 97% 95%   Physical Exam: Lungs:  non labored Incisions:  L arm incision c/d/I with surrounding ecchymosis; fullness but not firm Extremities:  palpable L radial pulse; grip strength symmetrical Neurologic: A&O  CBC    Component Value Date/Time   WBC 12.8 (H) 06/16/2022 1309   RBC 3.91 (L) 06/16/2022 1309   HGB 9.7 (L) 06/16/2022 1309   HGB 11.8 (L) 05/05/2022 1230   HCT 31.9 (L) 06/16/2022 1309   HCT 37.2 (L) 05/05/2022 1230   PLT 58 (L) 06/16/2022 1309   PLT 210 05/05/2022 1230   MCV 81.6 06/16/2022 1309   MCV 82 05/05/2022 1230   MCH 24.8 (L) 06/16/2022 1309   MCHC 30.4 06/16/2022 1309   RDW 18.5 (H) 06/16/2022 1309   RDW 14.5 05/05/2022 1230   LYMPHSABS 0.5 (L) 06/15/2022 0845   LYMPHSABS 0.8 05/05/2022 1230   MONOABS 1.0 06/15/2022 0845   EOSABS 0.0 06/15/2022 0845   EOSABS 0.0 05/05/2022 1230   BASOSABS 0.1 06/15/2022 0845   BASOSABS 0.0 05/05/2022 1230    BMET    Component Value Date/Time   NA 131 (L) 06/16/2022 0130   NA 137 05/05/2022 1230   K 4.3 06/16/2022 0130   CL 103 06/16/2022 0130   CO2 18 (L) 06/16/2022 0130   GLUCOSE 184 (H) 06/16/2022 0130   BUN 19 06/16/2022 0130   BUN 13 05/05/2022 1230   CREATININE 1.21 06/16/2022 0130   CREATININE 0.92 12/08/2015 0848   CALCIUM 8.2 (L) 06/16/2022 0130   GFRNONAA >60 06/16/2022 0130   GFRAA >60 04/02/2019 0901    INR    Component Value Date/Time   INR 1.6 (H) 06/15/2022 2242     Intake/Output Summary (Last 24 hours) at 06/17/2022 0806 Last data filed at 06/17/2022 0747 Gross per 24 hour  Intake 798.97 ml  Output 1195 ml  Net -396.03 ml     Assessment/Plan:  72 y.o. male is s/p L arm thrombectomy 1 Day Post-Op   L hand well perfused with palpable  radial pulse; symmetrical grip strength Fullness around incision but not firm; ok to increase heparin; continue to follow exam MV vegetation noted on TEE; Cardiology following  Emilie Rutter, PA-C [facility] [phone] 06/17/2022 8:06 AM  I have independently interviewed and examined patient and agree with PA assessment and plan above.  Stable hematoma left arm with well-perfused hand.  Compartments are soft in the left forearm will continue to monitor okay for full dose heparinization given mitral valve vegetation.  Vascular will continue to follow.  Ladarion Munyon C. Randie Heinz, MD [facility] [place] Office: [phone] Pager: [phone]

## 2022-06-17 NOTE — Progress Notes (Signed)
Progress Note    [name]   UEA:[id number]  DOB: February 16, 1950  DOA: 06/15/2022     2 PCP: [name], MD  Initial CC: left arm pain  Hospital Course: [name] is a 72 yo male with PMH atrial fib, PAD, CAD s/p CABG, CHB s/p PPM, obesity, AV replacement (bioprosthetic), MV replacement (bioprosthetic), HLD, prostate cancer who initially presented to the hospital on 06/15/2022 with left hand numbness and pain. He was admitted and underwent evaluation with vascular surgery.  He was found to have left upper extremity ischemia with CTA evidence of acute occlusion of the high left proximal brachial artery at the level of the left humeral neck. He then underwent left upper extremity thromboembolectomy via brachial and radial artery approach on 06/15/2022.  He recently had cardiac workup on 05/23/2022 after presenting with shortness of breath and found to have elevated troponin and BNP.  He underwent cardiac cath on 05/25/2022 which showed severe mid LAD stenosis.  It was also noted that left radial artery was unable to be accessed.  His symptoms were felt to be due to severe TR and he was referred to outpatient advanced heart failure clinic.  He was also changed from Coumadin to Eliquis.  Due to left brachial artery occlusion, cardiology was consulted to evaluate for possible cardiac source of embolus. He underwent TEE on 06/16/2022 which revealed a large vegetation involving mitral valve and small subcentimeter vegetation involving aortic valve.  Blood cultures on admission then became positive with E faecalis in 2/3 bottles.  He had initially been started on antibiotics on admission which were transitioned to ampicillin and Rocephin after blood cultures resulted.   Interval History:  Noted overnight.  Resting in bed comfortable when seen.  Some bruising noted in left forearm as expected after recent thrombectomy.  Seems to be doing okay otherwise.  Denies any chest pain, palpitations, shortness of  breath.  Assessment and Plan:  Endocarditis E. Faecalis bacteremia - underwent cardiology eval for cardiac embolic source for LUE arterial thrombus - s/p TEE on 06/16/22 positive for AV and MV vegetations - ID also following - continue rocephin and amp as per ID - will consult CTS per ID rec's as well  Left brachial artery occlusion - Patient presented with left arm pain and heaviness as per HPI.  Workup has been above showed acute occlusion of his left proximal brachial artery (now s/p thrombectomy on 06/15/2022) - vascular following - continue heparin drip  Acute CVA -Scattered punctate acute to subacute bilateral cerebral infarcts primarily involving white matter noted on MRI brain performed 06/16/2022 -Suspect cardiac embolic source given clinical workup thus far (suspected septic emboli) - neurology aware  History of MV and AV replacement (bioprosthetic's), 10/2018 with Dr. Cornelius Moras CABG x 4 Clipping of LAE Closure of PFO - all took place on 10/11/2018 with Dr. Cornelius Moras  RV failure Chronic dCHF Severe TR - cardiology following - recently started on dig and jardiance as well - recently referred to AHF outpatient as well - remains compensated    Atrial fibrillation - Recent change from warfarin to Eliquis.  Has been taking Eliquis regularly - continue heparin drip  - monitor Hgb and PLTC while on heparin    Hyponatremia - mild - trend for now   CAD - Status post CABG x 4, see above - Continue home atorvastatin, metoprolol - On heparin as above   Complete heart block - Status post pacemaker.  Paced rhythm observed thus far. - continue tele  Hyperlipidemia - Continue home atorvastatin   History of prostate cancer - Noted   Obesity - Noted   Old records reviewed in assessment of this patient  Antimicrobials: Vancomycin 06/16/2022 x 1 Ancef 5/7 >> 5/8 Cefuroxime 5/7 x 1 Ampicillin 5/9 >> current Rocephin 5/8 >> current   DVT prophylaxis:  SCDs Start: 06/15/22  2009   Code Status:   Code Status: Full Code  Mobility Assessment (last 72 hours)     Mobility Assessment     Row Name 06/17/22 0830 06/16/22 0439 06/16/22 0005 06/15/22 2030     Does patient have an order for bedrest or is patient medically unstable No - Continue assessment No - Continue assessment No - Continue assessment No - Continue assessment    What is the highest level of mobility based on the progressive mobility assessment? Level 5 (Walks with assist in room/hall) - Balance while stepping forward/back and can walk in room with assist - Complete Level 5 (Walks with assist in room/hall) - Balance while stepping forward/back and can walk in room with assist - Complete Level 5 (Walks with assist in room/hall) - Balance while stepping forward/back and can walk in room with assist - Complete Level 5 (Walks with assist in room/hall) - Balance while stepping forward/back and can walk in room with assist - Complete    Is the above level different from baseline mobility prior to current illness? Yes - Recommend PT order -- -- --             Barriers to discharge: none Disposition Plan:  TBD Status is: Inpt  Objective: Blood pressure 101/72, pulse 84, temperature (!) 97.5 F (36.4 C), temperature source Oral, resp. rate 20, height 5\' 7"  (1.702 m), weight 94.8 kg, SpO2 95 %.  Examination:  Physical Exam Constitutional:      Appearance: Normal appearance.  HENT:     Head: Normocephalic and atraumatic.     Mouth/Throat:     Mouth: Mucous membranes are moist.  Eyes:     Extraocular Movements: Extraocular movements intact.  Cardiovascular:     Rate and Rhythm: Normal rate and regular rhythm.  Pulmonary:     Effort: Pulmonary effort is normal.     Breath sounds: Normal breath sounds.  Abdominal:     General: Bowel sounds are normal. There is no distension.     Palpations: Abdomen is soft.     Tenderness: There is no abdominal tenderness.  Musculoskeletal:     Cervical back:  Normal range of motion and neck supple.     Comments: LUE noted with sutures in place and surrounding ecchymoses; mild edema  Skin:    General: Skin is warm and dry.  Neurological:     General: No focal deficit present.     Mental Status: He is alert.  Psychiatric:        Mood and Affect: Mood normal.        Behavior: Behavior normal.      Consultants:  ID Vascular surgery Cardiothoracic surgery Neurology Cardiology  Procedures:  06/15/2022: Left upper extremity thromboembolectomy via brachial and radial artery approach 06/15/2022: Left upper extremity angiogram of left subclavian and axillary arteries 06/16/2022: TEE  Data Reviewed: Results for orders placed or performed during the hospital encounter of 06/15/22 (from the past 24 hour(s))  APTT     Status: None   Collection Time: 06/17/22  1:55 AM  Result Value Ref Range   aPTT 33 24 - 36 seconds  Basic metabolic  panel     Status: Abnormal   Collection Time: 06/17/22  8:43 AM  Result Value Ref Range   Sodium 131 (L) 135 - 145 mmol/L   Potassium 4.1 3.5 - 5.1 mmol/L   Chloride 103 98 - 111 mmol/L   CO2 16 (L) 22 - 32 mmol/L   Glucose, Bld 159 (H) 70 - 99 mg/dL   BUN 24 (H) 8 - 23 mg/dL   Creatinine, Ser 1.61 0.61 - 1.24 mg/dL   Calcium 8.4 (L) 8.9 - 10.3 mg/dL   GFR, Estimated >09 >60 mL/min   Anion gap 12 5 - 15    I have reviewed pertinent nursing notes, vitals, labs, and images as necessary. I have ordered labwork to follow up on as indicated.  I have reviewed the last notes from staff over past 24 hours. I have discussed patient's care plan and test results with nursing staff, CM/SW, and other staff as appropriate.  Time spent: Greater than 50% of the 55 minute visit was spent in counseling/coordination of care for the patient as laid out in the A&P.   LOS: 2 days   Lewie Chamber, MD [facility] 06/17/2022, 1:20 PM

## 2022-06-17 NOTE — Progress Notes (Signed)
Rounding Note    Patient Name: [name] Date of Encounter: 06/17/2022  [facility] Cardiologist: Verne Carrow, MD   Subjective   Feels okay this morning. Left arm is sore but sensation is intact and radial pulse is palpable. No chest pain or SOB  TEE yesterday with large vegetation on bioprosthetic mitral valve leaflet without significant MR and a subcentimeter bioprosthetic AoV vegetation. Blood cultures positive for enterococcus. We discussed this at length today.  Inpatient Medications    Scheduled Meds:  atorvastatin  20 mg Oral Daily   digoxin  0.125 mg Oral Daily   docusate sodium  100 mg Oral BID   empagliflozin  10 mg Oral QAC breakfast   furosemide  20 mg Oral BID   metoprolol tartrate  37.5 mg Oral BID   pantoprazole  40 mg Oral Daily   sodium chloride flush  3 mL Intravenous Once   sodium chloride flush  3 mL Intravenous Q12H   Continuous Infusions:  sodium chloride     sodium chloride Stopped (06/16/22 1826)   ampicillin (OMNIPEN) IV     cefTRIAXone (ROCEPHIN)  IV     heparin 500 Units/hr (06/17/22 0545)   PRN Meds: sodium chloride, sodium chloride, acetaminophen **OR** acetaminophen, alum & mag hydroxide-simeth, bisacodyl, guaiFENesin-dextromethorphan, metoprolol tartrate, morphine injection, ondansetron, phenol, senna-docusate, sodium chloride flush, traMADol   Vital Signs    Vitals:   06/16/22 2009 06/16/22 2347 06/17/22 0330 06/17/22 0746  BP: 120/66 121/77 123/68 122/73  Pulse: 90 87 85 61  Resp: 20 19 20 18   Temp: 97.6 F (36.4 C) 97.7 F (36.5 C) 97.9 F (36.6 C) (!) 97.5 F (36.4 C)  TempSrc: Oral Oral Oral Oral  SpO2: 93% 95% 97% 95%  Weight:      Height:        Intake/Output Summary (Last 24 hours) at 06/17/2022 0926 Last data filed at 06/17/2022 0747 Gross per 24 hour  Intake 795.97 ml  Output 1195 ml  Net -399.03 ml      06/15/2022    9:19 AM 06/09/2022    3:09 PM 06/03/2022   11:46 AM  Last 3 Weights   Weight (lbs) 209 lb 209 lb 212 lb  Weight (kg) 94.802 kg 94.802 kg 96.163 kg      Telemetry    A-sensed with v-pacing, PVCs - Personally Reviewed  ECG    No new tracing today - Personally Reviewed  Physical Exam   GEN: No acute distress.   Neck: No JVD Cardiac: RRR, 2/6 systolic murmur Respiratory: Clear to auscultation bilaterally. GI: Soft, nontender, non-distended  MS: Left arm ecchymosis but hand warm and left radial pulse is palpable Neuro:  Nonfocal  Psych: Normal affect   Labs    High Sensitivity Troponin:   Recent Labs  Lab 05/23/22 1126 05/23/22 1351 05/24/22 0826 05/24/22 1016  TROPONINIHS 185* 178* 88* 72*     Chemistry Recent Labs  Lab 06/15/22 0830 06/15/22 0835 06/15/22 2242 06/16/22 0130  NA 134* 135 131* 131*  K 3.3* 3.5 4.2 4.3  CL 106 106 101 103  CO2 14*  --  19* 18*  GLUCOSE 165* 171* 190* 184*  BUN 14 16 18 19   CREATININE 1.02 0.90 1.08 1.21  CALCIUM 7.7*  --  8.3* 8.2*  PROT 5.9*  --  6.3*  --   ALBUMIN 2.3*  --  2.6*  --   AST 26  --  25  --   ALT 12  --  14  --   ALKPHOS 73  --  74  --   BILITOT 2.0*  --  1.7*  --   GFRNONAA >60  --  >60 >60  ANIONGAP 14  --  11 10    Lipids No results for input(s): "CHOL", "TRIG", "HDL", "LABVLDL", "LDLCALC", "CHOLHDL" in the last 168 hours.  Hematology Recent Labs  Lab 06/15/22 0845 06/15/22 2242 06/16/22 1309  WBC 18.1* 14.0* 12.8*  RBC 4.16* 4.01* 3.91*  HGB 10.4* 10.1* 9.7*  HCT 34.8* 32.0* 31.9*  MCV 83.7 79.8* 81.6  MCH 25.0* 25.2* 24.8*  MCHC 29.9* 31.6 30.4  RDW 18.6* 18.6* 18.5*  PLT 79* 84* 58*   Thyroid No results for input(s): "TSH", "FREET4" in the last 168 hours.  BNPNo results for input(s): "BNP", "PROBNP" in the last 168 hours.  DDimer No results for input(s): "DDIMER" in the last 168 hours.   Radiology    MR BRAIN WO CONTRAST  Result Date: 06/16/2022 CLINICAL DATA:  Neuro deficit, acute, stroke suspected. Slurred speech. EXAM: MRI HEAD WITHOUT CONTRAST  TECHNIQUE: Multiplanar, multiecho pulse sequences of the brain and surrounding structures were obtained without intravenous contrast. COMPARISON:  Head CT and CTA 06/15/2022 FINDINGS: Brain: There are punctate foci of mildly restricted diffusion involving white matter of the posterior left temporal lobe, left occipital lobe, and posterior right frontal lobe as well as cortex in the right postcentral gyrus. A few small chronic bilateral cerebral microhemorrhages are noted. Small T2 hyperintensities in the cerebral white matter bilaterally are nonspecific but compatible with minimal chronic small vessel ischemic disease. No mass, midline shift, or extra-axial fluid collection is evident. There is mild cerebral and cerebellar atrophy. There may be a tiny chronic right cerebellar infarct. Vascular: Major intracranial vascular flow voids are preserved. Skull and upper cervical spine: Unremarkable bone marrow signal. Sinuses/Orbits: Unremarkable orbits. Paranasal sinuses and mastoid air cells are clear. Other: None. IMPRESSION: Scattered punctate acute to subacute bilateral cerebral infarcts primarily involving white matter. Electronically Signed   By: Sebastian Ache M.D.   On: 06/16/2022 16:22   EP STUDY  Result Date: 06/16/2022 See surgical note for result.  HYBRID OR IMAGING ([facility] ONLY)  Result Date: 06/15/2022 There is no interpretation for this exam.  This order is for images obtained during a surgical procedure.  Please See "Surgeries" Tab for more information regarding the procedure.    Cardiac Studies   TEE 05/08 prelim report: KEY FINDINGS:   Left ventricle: normal function. Limited visualization of the LV apex, however, no obvious thrombus noted.  Right ventricle: Moderate to severely dilated with moderately reduced function.  Mitral valve: 27mm [employer] bioprosthetic valve in the mitral position. There is a large vegetation encompassing a majority of the valve leaflets. There is mild mitral  stenosis. The valve is otherwise well seated with no regurgitation.  Aortic valve: 23mm Edwards bovine valve present (10/11/18). The aortic valve leaflets appear thickened with a small subcentimeter vegetation seen on limited views at the aortic aspect of the valve. The valve is otherwise well seated with no paravalvular or valvular regurgitation. No signs of aortic root abscess or pathology at the aortomitral curtain. ECHO: - 05/23/22: Normal LV function, severely dilated RV with moderately reduced function, moderate stenosis of prosthesis (gradient ), severe TR due to poor coaptation of leaflets, s/p AVR as per my personal interpretation - 06/13/19: Normal LV function with septal bowing to the left; dilated RV with mildly reduced function. Mitral bioprosthesis gradient of - 06/27/18: Normal  LV function, normal RV function. Severe MS/AS with moderate AI.    CATH: - 05/25/22:   Prox LAD lesion is 40% stenosed.   Mid LAD lesion is 95% stenosed.   Prox Cx to Mid Cx lesion is 30% stenosed.   1st Mrg lesion is 30% stenosed.   3rd Mrg lesion is 30% stenosed.   Prox RCA lesion is 100% stenosed.   SVG graft was visualized by angiography and is normal in caliber.   LIMA graft was visualized by angiography and is normal in caliber.   Severe mid LAD stenosis. Competitive filling is seen distally from the patent LIMA graft.  Patent LIMA graft to the mid LAD.  Mild non-obstructive disease in the Circumflex artery.  Large dominant RCA with chronic proximal occlusion. The mid and distal RCA fills from the patent vein graft.  Near normal right and left heart pressures.  CO 6.92 L/min CI 3.33     Diagnostic Dominance: Right  Intervention   - 09/15/2018: Prox RCA lesion is 100% stenosed. Prox Cx to Mid Cx lesion is 30% stenosed. 1st Mrg lesion is 30% stenosed. 3rd Mrg lesion is 30% stenosed. Prox LAD lesion is 40% stenosed. Mid LAD lesion is 95% stenosed.   1. Severe stenosis mid LAD 2.  Mild non-obstructive disease in the LAD 3. Chronic occlusion of the proximal RCA. Filling of the proximal, mid and distal RCA from left to right collaterals 4. Severe aortic stenosis (mean gradient 34.1 mmHg, peak to peak gradient 32 mmHg, AVA 1.1 cm2) 5. Severe mitral stenosis by echo.     Patient Profile     72 y.o. male with a hx of CAD with CABG X 2 vessel and L atrial clipping, + foramen ovale closure, s/p aortic and MVR bioprosthetic and PPM, atrial flutter, tobacco abuse, HLD, severe TR with RV dysfunction who presented to the ER with a cold left arm with lack of a pulse found to have acute occlusion of the high left brachial artery now s/p thromboembolectomy. Cardiology was initially consulted for further evaluation for possible cardiac source of embolus. TEE 5/8 with large mitral valve vegetation and subcentimeter AoV vegetation with prelim blood cultures positive for enterococcus concerning for IE.   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: -Patient presented with a cold left arm with lack of radial pulse found to have acute occlusion of the left brachial artery now s/p thromboembolectomy with vascular surgery -TEE obtained for cardiac source of embolus and revealed large bioprosthetic mitral valve vegetation and subcentimeter AoV vegetation concerning for endocarditis -Initial blood cultures positive for enterococcus  -ID consulted and currently on amp, CTX -Fortunately, there is no significant MR/AR at this time  -Likely not a surgical candidate given comorbidities  #RV Failure: #Severe TR: #Chronic Diastolic HF: -Patient with mainly right sided failure followed by Dr. Gasper Lloyd as outpatient -Suspect secondary to chronic RV pacing and severe TR -PCWP and mPAP not significantly elevated and MS only mild on TEE making this unlikely to be contributing to degree of RV dysfunction -Currently compensated on exam -Continue home lasix 20mg  BID -Continue jardiance  10mg  daily -Continue dig 0.125mg  daily  #Paroxysmal Afib: -Suspect acute arterial thrombosis was secondary to MV/AV endocarditis rather than apixaban failure -Continue heparin gtt for now for [facility] -Given only mild MS on TEE, likely can resume home apixaban long-term -Continue home metop 37.5mg  BID  #CAD s/p CABG: -Continue lipitor 20mg  daily -Not on ASA due to need for [facility] -Continue metop 37.5mg  BID  as above  #CHB s/p PPM: -No evidence of device lead vegetations on TEE -Initial blood cultures with enterococcus and fortunately not staph/strep -On ABX      For questions or updates, please contact [facility] Please consult [email or web address] for contact info under        Signed, Meriam Sprague, MD  06/17/2022, 9:26 AM

## 2022-06-17 NOTE — Consult Note (Addendum)
[address]       [place] [address]             [phone]        KERBY FOLLOWELL [facility] Medical Record #[id number] Date of Birth: 07/04/1950  Referring: No ref. 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   History of Present Illness:     [name] is a 72 year old gentleman with a past medical history notable for coronary artery disease, congestive heart failure, prostate cancer, complete heart block, and atrial flutter.  He has a history of radiation therapy to the mediastinum.  He is known to our practice having bioprosthetic aortic and mitral valve replacements along with two-vessel coronary bypass grafting, closure of a patent foramen ovale, and left atrial appendage clipping in September 2020 by Dr. Cornelius Moras. [name] was brought to the [facility] emergency room by EMS on 06/15/2022 for he woke up with left arm pain and heaviness.  On evaluation in the ED, his left hand was noticeably cooler than the right send radial pulse.  His white blood count was 18,000.  Poor data in the ED was otherwise unremarkable.  CT scan of the head showed no evidence of CVA.  The left upper extremity was obtained showing occlusion of the left brachial artery.  [name] was transferred to [facility].  He was seen by Dr. Lemar Livings of vascular surgery.  The patient was taken to the operating room where left upper extremity thrombectomy was carried out.  After the patient was stabilized, he was seen by Dr. Laurance Flatten of the cardiology service.  She recommended transesophageal echocardiography to look for the source of the left brachial arterial embolus.  This study was obtained yesterday and showed a large prosthetic mitral valve vegetation.  Additionally,  he has had blood cultures that are positive for Enterococcus faecalis.  Mr. Mccollin has been seen by the infectious disease service and continuation of IV ampicillin and ceftriaxone was recommended.  CT surgery has been asked to evaluate [name] for potential surgical intervention of his mitral valve endocarditis. [name] is resting in bed with his wife at the bedside at the time of this interview.  He denies any chest pain or shortness of breath.  He has been anticoagulated with warfarin since his valve replacements in 2020 but was recently been switched to Eliquis.   Zubrod Score: At the time of surgery this patient's most appropriate activity status/level should be described as: []     0    Normal activity, no symptoms [x]     1    Restricted in physical strenuous activity but ambulatory, able to do out light work []     2    Ambulatory and capable of self care, unable to do work activities, up and about                 more than 50%  Of the time                            []     3    Only limited self care, in bed greater than 50% of waking hours []     4    Completely disabled, no self care, confined  to bed or chair []     5    Moribund  Past Medical History:  Diagnosis Date   BCE (basal cell epithelioma), arm    RIGHT SHOULDER   Bradycardia    Bumps on skin    on left side of nose for last 3 weeks   CAD (coronary artery disease)    a. s/p CABGx2 10/11/18 LIMA to LAD, SVG to RCA, EVH via right thigh.   Dyslipidemia    Ectatic thoracic aorta (HCC)    Esophageal stricture    GERD (gastroesophageal reflux disease)    Heart murmur    sees dr Rose Fillers   Hyperlipidemia    Incidental pulmonary nodule, > 3mm and < 8mm 10/04/2018   Noted on CTA   Inguinal hernia    bilateral   Obesity    Presence of permanent cardiac pacemaker    Prostate cancer (HCC)    S/P aortic valve replacement with bioprosthetic valve 10/11/2018   23 mm Edwards Inspiris Resilia stented bovine pericardial tissue valve,  miltral valve done also   S/P CABG x 2 10/11/2018   LIMA to LAD, SVG to RCA, EVH via right thigh   S/P mitral valve replacement with bioprosthetic valve 10/11/2018   27 mm Medtronic Mosaic stented porcine bioprosthetic tissue valve   S/P patent foramen ovale closure 10/11/2018   S/P placement of cardiac pacemaker    Smoker     Past Surgical History:  Procedure Laterality Date   AORTIC VALVE REPLACEMENT N/A 10/11/2018   Procedure: AORTIC VALVE REPLACEMENT (AVR) with 23 Inspiris Bioprosthetic Aortic valve.;  Surgeon: Purcell Nails, MD;  Location: [facility] OR;  Service: Open Heart Surgery;  Laterality: N/A;   AV FISTULA PLACEMENT Left 06/15/2022   Procedure: LEFT UPPER EXTREMITY THROMBECTOMY;  Surgeon: Maeola Harman, MD;  Location[facility] OR;  Service: Vascular;  Laterality: Left;   CLIPPING OF ATRIAL APPENDAGE N/A 10/11/2018   Procedure: CLIPPING OF LEFT ATRIAL APPENDAGE with 45 AtriCure Clip.;  Surgeon: Purcell Nails, MD;  Location: [facility] OR;  Service: Open Heart Surgery;  Laterality: N/A;   COLONOSCOPY  03/2008,2004   Dr. Madilyn Fireman   CORONARY ARTERY BYPASS GRAFT N/A 10/11/2018   Procedure: CORONARY ARTERY BYPASS GRAFTING (CABG) x 2 using LIMA to the LAD and endoscopic greater saphenous vein harvesting to the RCA.;  Surgeon: Purcell Nails, MD;  Location: [facility] OR;  Service: Open Heart Surgery;  Laterality: N/A;   CYSTOSCOPY  04/04/2019   Procedure: CYSTOSCOPY;  Surgeon: Rene Paci, MD;  Location: [facility];  Service: Urology;;   HERNIA REPAIR  12/11/10   BIH   MITRAL VALVE REPLACEMENT N/A 10/11/2018   Procedure: MITRAL VALVE (MV) REPLACEMENT with 27 Mosaic Bioprosthetic Mitral valve.;  Surgeon: Purcell Nails, MD;  Location: [facility] OR;  Service: Open Heart Surgery;  Laterality: N/A;   PACEMAKER IMPLANT N/A 10/17/2018   Procedure: PACEMAKER IMPLANT;  Surgeon: Hillis Range, MD;  Location: [facility] INVASIVE CV LAB;  Service: Cardiovascular;  Laterality: N/A;   RADIOACTIVE SEED  IMPLANT N/A 04/04/2019   Procedure: RADIOACTIVE SEED IMPLANT/BRACHYTHERAPY IMPLANT, cystoscopy;  Surgeon: Rene Paci, MD;  Location[facility];  Service: Urology;  Laterality: N/A;  58 seeds   REPAIR OF PATENT FORAMEN OVALE N/A 10/11/2018   Procedure: CLOSURE OF PATENT FORAMEN OVALE;  Surgeon: Purcell Nails, MD;  Location: [facility] OR;  Service: Open Heart Surgery;  Laterality: N/A;   RIGHT/LEFT HEART CATH AND CORONARY ANGIOGRAPHY N/A 09/15/2018  Procedure: RIGHT/LEFT HEART CATH AND CORONARY ANGIOGRAPHY;  Surgeon: Kathleene Hazel, MD;  Location: [facility] INVASIVE CV LAB;  Service: Cardiovascular;  Laterality: N/A;   RIGHT/LEFT HEART CATH AND CORONARY/GRAFT ANGIOGRAPHY N/A 05/25/2022   Procedure: RIGHT/LEFT HEART CATH AND CORONARY/GRAFT ANGIOGRAPHY;  Surgeon: Kathleene Hazel, MD;  Location: [facility] INVASIVE CV LAB;  Service: Cardiovascular;  Laterality: N/A;   TEE WITHOUT CARDIOVERSION N/A 12/12/2015   Procedure: TRANSESOPHAGEAL ECHOCARDIOGRAM (TEE);  Surgeon: Thurmon Fair, MD;  Location: [facility] ENDOSCOPY;  Service: Cardiovascular;  Laterality: N/A;   TEE WITHOUT CARDIOVERSION N/A 10/11/2018   Procedure: TRANSESOPHAGEAL ECHOCARDIOGRAM (TEE);  Surgeon: Purcell Nails, MD;  Location: [facility] OR;  Service: Open Heart Surgery;  Laterality: N/A;   TEE WITHOUT CARDIOVERSION N/A 06/16/2022   Procedure: TRANSESOPHAGEAL ECHOCARDIOGRAM;  Surgeon: Dorthula Nettles, DO;  Location: [facility] INVASIVE CV LAB;  Service: Cardiovascular;  Laterality: N/A;   THYMECTOMY  1976   radaiation tx done    Social History   Tobacco Use  Smoking Status Former   Packs/day: 0.25   Years: 35.00   Additional pack years: 0.00   Total pack years: 8.75   Types: Cigarettes   Quit date: 09/04/2018   Years since quitting: 3.7  Smokeless Tobacco Never    Social History   Substance and Sexual Activity  Alcohol Use Yes   Alcohol/week: 1.0 standard drink of alcohol   Types: 1 Cans of beer per week   Comment:  rare     No Known Allergies  Current Facility-Administered Medications  Medication Dose Route Frequency Provider Last Rate Last Admin   0.9 %  sodium chloride infusion  250 mL Intravenous PRN Baglia, Corrina, PA-C       0.9 %  sodium chloride infusion   Intravenous PRN Synetta Fail, MD   Stopped at 06/16/22 1826   acetaminophen (TYLENOL) tablet 325-650 mg  325-650 mg Oral Q4H PRN Baglia, Corrina, PA-C       Or   acetaminophen (TYLENOL) suppository 325-650 mg  325-650 mg Rectal Q4H PRN Baglia, Corrina, PA-C       alum & mag hydroxide-simeth (MAALOX/MYLANTA) 200-200-20 MG/5ML suspension 15-30 mL  15-30 mL Oral Q2H PRN Baglia, Corrina, PA-C       ampicillin (OMNIPEN) 2 g in sodium chloride 0.9 % 100 mL IVPB  2 g Intravenous Q4H Danelle Earthly, MD       atorvastatin (LIPITOR) tablet 20 mg  20 mg Oral Daily Baglia, Corrina, PA-C   20 mg at 06/17/22 0817   bisacodyl (DULCOLAX) EC tablet 5 mg  5 mg Oral Daily PRN Baglia, Corrina, PA-C       cefTRIAXone (ROCEPHIN) 2 g in sodium chloride 0.9 % 100 mL IVPB  2 g Intravenous Q12H Danelle Earthly, MD 200 mL/hr at 06/17/22 1009 2 g at 06/17/22 1009   digoxin (LANOXIN) tablet 0.125 mg  0.125 mg Oral Daily Baglia, Corrina, PA-C   0.125 mg at 06/17/22 0818   docusate sodium (COLACE) capsule 100 mg  100 mg Oral BID Baglia, Corrina, PA-C   100 mg at 06/17/22 0817   empagliflozin (JARDIANCE) tablet 10 mg  10 mg Oral QAC breakfast Baglia, Corrina, PA-C   10 mg at 06/17/22 0818   furosemide (LASIX) tablet 20 mg  20 mg Oral BID Baglia, Corrina, PA-C   20 mg at 06/17/22 0818   guaiFENesin-dextromethorphan (ROBITUSSIN DM) 100-10 MG/5ML syrup 15 mL  15 mL Oral Q4H PRN Baglia, Corrina, PA-C       heparin ADULT infusion 100  units/mL (25000 units/29mL)  700 Units/hr Intravenous Continuous Silvana Newness, RPH 7 mL/hr at 06/17/22 1015 700 Units/hr at 06/17/22 1015   metoprolol tartrate (LOPRESSOR) injection 2-5 mg  2-5 mg Intravenous Q2H PRN Baglia, Corrina, PA-C        metoprolol tartrate (LOPRESSOR) tablet 37.5 mg  37.5 mg Oral BID Baglia, Corrina, PA-C   37.5 mg at 06/17/22 0817   morphine (PF) 2 MG/ML injection 2-5 mg  2-5 mg Intravenous Q1H PRN Baglia, Corrina, PA-C       ondansetron (ZOFRAN) injection 4 mg  4 mg Intravenous Q6H PRN Baglia, Corrina, PA-C       pantoprazole (PROTONIX) EC tablet 40 mg  40 mg Oral Daily Baglia, Corrina, PA-C   40 mg at 06/17/22 0817   phenol (CHLORASEPTIC) mouth spray 1 spray  1 spray Mouth/Throat PRN Baglia, Corrina, PA-C       senna-docusate (Senokot-S) tablet 1 tablet  1 tablet Oral QHS PRN Baglia, Corrina, PA-C       sodium chloride flush (NS) 0.9 % injection 3 mL  3 mL Intravenous Once Baglia, Corrina, PA-C       sodium chloride flush (NS) 0.9 % injection 3 mL  3 mL Intravenous Q12H Baglia, Corrina, PA-C   3 mL at 06/17/22 0818   sodium chloride flush (NS) 0.9 % injection 3 mL  3 mL Intravenous PRN Baglia, Corrina, PA-C       traMADol (ULTRAM) tablet 50 mg  50 mg Oral Q6H PRN Baglia, Corrina, PA-C   50 mg at 06/16/22 0604    Medications Prior to Admission  Medication Sig Dispense Refill Last Dose   acetaminophen (TYLENOL) 325 MG tablet Take 650 mg by mouth as needed for moderate pain.   Unk   apixaban (ELIQUIS) 5 MG TABS tablet Take 1 tablet (5 mg total) by mouth 2 (two) times daily. 60 tablet 1 06/14/2022 at 1900   atorvastatin (LIPITOR) 20 MG tablet Take 1 tablet (20 mg total) by mouth daily. 90 tablet 3 06/14/2022   digoxin (LANOXIN) 0.125 MG tablet Take 1 tablet (0.125 mg total) by mouth daily. 90 tablet 3 06/14/2022   empagliflozin (JARDIANCE) 10 MG TABS tablet Take 1 tablet (10 mg total) by mouth daily before breakfast. 30 tablet 11 06/14/2022   furosemide (LASIX) 20 MG tablet Take 20 mg by mouth 2 (two) times daily.   06/14/2022   Iron, Ferrous Sulfate, 325 (65 Fe) MG TABS Take 1 tablet by mouth 2 (two) times daily. (Patient taking differently: Take 1 tablet by mouth daily.) 60 tablet 1 06/14/2022   metoprolol tartrate  (LOPRESSOR) 25 MG tablet Take 1.5 tablets (37.5 mg total) by mouth 2 (two) times daily. 180 tablet 3 06/14/2022 at 1900   oxymetazoline (AFRIN) 0.05 % nasal spray Place 1 spray into both nostrils at bedtime as needed for congestion.      polyethylene glycol powder (GLYCOLAX/MIRALAX) 17 GM/SCOOP powder Take 17 g by mouth daily as needed for mild constipation. 238 g 0 Unk    Family History  Problem Relation Age of Onset   Colon cancer Mother    Lung cancer Father        smoker   CAD Neg Hx    Pancreatic cancer Neg Hx    Breast cancer Neg Hx      Review of Systems:       Cardiac Review of Systems: Y or  [    ]= no  Chest Pain [    ]  Resting SOB [   ] Exertional SOB  [  ]  Orthopnea [  ]   Pedal Edema [   ]    Palpitations [  ] Syncope  [  ]   Presyncope [   ]  General Review of Systems: [Y] = yes [  ]=no Constitional: recent weight change [ x ]; anorexia [  ]; fatigue [  ]; nausea [  ]; night sweats [  ]; fever [ x ]; or chills [  ]                                                               Dental: Last Dentist visit: In the last few months  Eye : blurred vision [  ]; diplopia [   ]; vision changes [  ];  Amaurosis fugax[  ]; Resp: cough [  ];  wheezing[  ];  hemoptysis[  ]; shortness of breath[  ]; paroxysmal nocturnal dyspnea[  ]; dyspnea on exertion[  ]; or orthopnea[  ];  GI:  gallstones[  ], vomiting[  ];  dysphagia[  ]; melena[  ];  hematochezia [  ]; heartburn[  ];   Hx of  Colonoscopy[  ]; GU: kidney stones [  ]; hematuria[  ];   dysuria [  ];  nocturia[  ];  history of     obstruction [  ]; urinary frequency [  ]             Skin: rash, swelling[  ];, hair loss[  ];  peripheral edema[  ];  or itching[  ]; Musculosketetal: myalgias[  ];  joint swelling[  ];  joint erythema[  ];  joint pain[  ];  back pain[  ];  Heme/Lymph: bruising[  ];  bleeding[  ];  anemia[  ];  Neuro: TIA[  ];  headaches[  ];  stroke[  ];  vertigo[  ];  seizures[  ];   paresthesias[  ];  difficulty  walking[  ];  Psych:depression[  ]; anxiety[  ];  Endocrine: diabetes[  ];  thyroid dysfunction[  ];                  Physical Exam: BP 101/72 (BP Location: Right Leg)   Pulse 84   Temp (!) 97.5 F (36.4 C) (Oral)   Resp 20   Ht 5\' 7"  (1.702 m)   Wt 94.8 kg   SpO2 95%   BMI 32.73 kg/m    General appearance: alert, cooperative, and no distress Head: Normocephalic, without obvious abnormality, atraumatic Neck: no adenopathy, no carotid bruit, no JVD, and supple, symmetrical, trachea midline Resp: clear to auscultation bilaterally Cardio: Irregularly irregular rhythm.  Monitor shows atrial fibrillation.  I do not hear a murmur. GI: Soft, no tenderness Extremities: Palpable radial pulses bilaterally.  There is bruising over the left brachium from recent embolectomy by vascular surgery.  The right dorsalis pedis and posterior tibial pulses are palpable but I do not palpate pulses in the left ankle. Neurologic: Grossly normal  Diagnostic Studies & Laboratory data:     Recent Radiology Findings:   MR BRAIN WO CONTRAST  Result Date: 06/16/2022 CLINICAL DATA:  Neuro deficit, acute, stroke suspected. Slurred speech. EXAM: MRI HEAD WITHOUT CONTRAST TECHNIQUE: Multiplanar, multiecho pulse  sequences of the brain and surrounding structures were obtained without intravenous contrast. COMPARISON:  Head CT and CTA 06/15/2022 FINDINGS: Brain: There are punctate foci of mildly restricted diffusion involving white matter of the posterior left temporal lobe, left occipital lobe, and posterior right frontal lobe as well as cortex in the right postcentral gyrus. A few small chronic bilateral cerebral microhemorrhages are noted. Small T2 hyperintensities in the cerebral white matter bilaterally are nonspecific but compatible with minimal chronic small vessel ischemic disease. No mass, midline shift, or extra-axial fluid collection is evident. There is mild cerebral and cerebellar atrophy. There may be a  tiny chronic right cerebellar infarct. Vascular: Major intracranial vascular flow voids are preserved. Skull and upper cervical spine: Unremarkable bone marrow signal. Sinuses/Orbits: Unremarkable orbits. Paranasal sinuses and mastoid air cells are clear. Other: None. IMPRESSION: Scattered punctate acute to subacute bilateral cerebral infarcts primarily involving white matter. Electronically Signed   By: Sebastian Ache M.D.   On: 06/16/2022 16:22   EP STUDY  Result Date: 06/16/2022 See surgical note for result.    TRANSESOPHAGEAL ECHOCARDIOGRAM GUIDED DIRECT CURRENT CARDIOVERSION   NAME:  MURRELL LECCE                                   MRN:   [id number] DOB:  1950-08-13                                  ADMIT DATE: 06/15/2022   INDICATIONS: Evaluate for LV thrombus Evaluate prosthetic mitral valve / aortic valve   PROCEDURE:    Informed consent was obtained prior to the procedure. The risks, benefits and alternatives for the procedure were discussed and the patient comprehended these risks.  Risks include, but are not limited to, cough, sore throat, vomiting, nausea, somnolence, esophageal and stomach trauma or perforation, bleeding, low blood pressure, aspiration, pneumonia, infection, trauma to the teeth and death.     After a procedural time-out, the oropharynx was anesthetized and the patient was sedated by the anesthesia service. The transesophageal probe was inserted in the esophagus and stomach without difficulty and multiple views were obtained. Sedation by anesthesia   COMPLICATIONS:     Complications: No complications Patient tolerated procedure well.   KEY FINDINGS:   Left ventricle: normal function. Limited visualization of the LV apex, however, no obvious thrombus noted.  Right ventricle: Moderate to severely dilated with moderately reduced function.  Mitral valve: 27mm Medtronic bioprosthetic valve in the mitral position. There is a large vegetation encompassing a majority of  the valve leaflets. There is mild mitral stenosis. The valve is otherwise well seated with no regurgitation.  Aortic valve: 23mm Edwards bovine valve present (10/11/18). The aortic valve leaflets appear thickened with a small subcentimeter vegetation seen on limited views at the aortic aspect of the valve. The valve is otherwise well seated with no paravalvular or valvular regurgitation. No signs of aortic root abscess or pathology at the aortomitral curtain.   Full report to follow.    Aditya Sabharwal Advanced Heart Failure 11:35 AM   I have independently reviewed the above radiologic studies and discussed with the patient   Recent Lab Findings: Lab Results  Component Value Date   WBC 12.8 (H) 06/16/2022   HGB 9.7 (L) 06/16/2022   HCT 31.9 (L) 06/16/2022   PLT 58 (L) 06/16/2022   GLUCOSE  159 (H) 06/17/2022   CHOL 77 05/25/2022   TRIG 73 05/25/2022   HDL 25 (L) 05/25/2022   LDLCALC 37 05/25/2022   ALT 14 06/15/2022   AST 25 06/15/2022   NA 131 (L) 06/17/2022   K 4.1 06/17/2022   CL 103 06/17/2022   CREATININE 1.09 06/17/2022   BUN 24 (H) 06/17/2022   CO2 16 (L) 06/17/2022   TSH 1.730 11/02/2018   INR 1.6 (H) 06/15/2022   HGBA1C 5.8 (H) 10/09/2018      Assessment / Plan:      72 year old male with past history of mediastinal radiation, coronary artery disease, aortic stenosis, and mitral stenosis who is status post two-vessel coronary bypass grafting along with bioprosthetic mitral and aortic valve replacements by Dr. Cornelius Moras in 2020.  He also had closure of a patent foramen ovale and ligation of the left atrial appendage in the same setting.  After having some unexplained weight loss and general fatigue and weakness over the past several months, Mr. Doran had acute onset left arm pain and numbness and was found to have acute thrombosis of the left brachial artery.  Following successful embolectomy by Dr. Randie Heinz on 5/7, further workup has identified a large vegetation on the mitral  valve felt to be the source of the embolus.  There is mild mitral stenosis and no significant mitral insufficiency.  The prosthetic aortic valve also functioning appropriately.  Additionally, blood cultures are now positive for Enterococcus faecalis that is being treated with IV ampicillin and ceftriaxone.  Transesophageal echo has been reviewed by Dr. Cliffton Asters and he recommends continuation of medical management for the mitral valve endocarditis.  Recommend follow-up echocardiography in about 2 weeks.  I  spent 20 minutes counseling the patient face to face.   Leary Roca, PA-C  06/17/2022 3:41 PM    Agree with above 72yo male with prosthetic mitral and aortic valve endocarditis.  He also has RV dysfunction and severe TR.  Additionally, he has had recent embolic strokes.  Given all of his comorbidities, the likelihood of a successful surgical outcome is very limited.  Would recommend medical management, and antibiotic therapy at this point.  Welda Azzarello Keane Scrape

## 2022-06-17 NOTE — Progress Notes (Addendum)
NEUROLOGY CONSULTATION PROGRESS NOTE   Date of service: Jun 17, 2022 Patient Name: [name] MRN:  [id number] DOB:  [birth year]  Brief HPI   [name] is a 72 y/o person living with a history of atrial flutter on anticoagulation, CAD CABG x 2 vessel, foramen ovale closure, s/p aortic and MVR bioprosthetic valves, s/p pacemaker placement, tobacco use, HLD who presented with acute thrombus to the LUE s/p urgent surgical intervention. 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    Interval Hx   [name] is laying in bed comfortably with his sister and wife at bedside. We discussed leading up to his hospital course and what has occurred since.   He has noticed improvement in the numbness and heaviness in his left arm since surgical intervention a few days ago. He denies any new neurological deficits.  He has yet to ambulate further than what is in his hospital room.  He denies any acute changes in his vision, hearing or tasting, upper or lower extremity numbness, tingling, strength.  Has noticed feeling of certain pills getting stuck with swallowing.  Vitals   Vitals:   06/16/22 2347 06/17/22 0330 06/17/22 0746 06/17/22 1127  BP: 121/77 123/68 122/73 101/72  Pulse: 87 85 61 84  Resp: 19 20 18 20   Temp: 97.7 F (36.5 C) 97.9 F (36.6 C) (!) 97.5 F (36.4 C) (!) 97.5 F (36.4 C)  TempSrc: Oral Oral Oral Oral  SpO2: 95% 97% 95% 95%  Weight:      Height:         Body mass index is 32.73 kg/m.  Physical Exam   General: Laying comfortably in bed; in no acute distress.  HENT: Normal oropharynx and mucosa. Normal external appearance of ears and nose.  Neck: Supple CV: 2 out of 6 systolic murmur best heard over left second intercostal space Pulmonary: Symmetric Chest rise. Normal respiratory effort.  Abdomen: Soft to touch, non-tender. Ext: Ecchymosis of left proximal forearm.  Palpable left  radial pulse Musculoskeletal:  Mental Status: Alert and oriented to person place time and situation. Cranial Nerves: II- Peoples equal round reactive to light  II/IV/VI- extraocular motion intact, no nystagmus present  V/VII- normal sensation and no facial asymmetry  VIII- normal hearing during conversation  IX/X- palate midline  XI- normal shoulder shrug  XII-no tongue abnormalities Motor: 5 out of 5 strength in bilateral upper and lower extremities Sensory: Normal sensation throughout to light touch DTR's: 2+ bilateral brachioradialis and patellar reflexes Cerebellar: Gait deferred Normal finger-to-nose and dysdiadochocinesia absent. No pronator drift  Labs   Basic Metabolic Panel:  Lab Results  Component Value Date   NA 131 (L) 06/17/2022   K 4.1 06/17/2022   CO2 16 (L) 06/17/2022   GLUCOSE 159 (H) 06/17/2022   BUN 24 (H) 06/17/2022   CREATININE 1.09 06/17/2022   CALCIUM 8.4 (L) 06/17/2022   GFRNONAA >60 06/17/2022   GFRAA >60 04/02/2019   HbA1c:  Lab Results  Component Value Date   HGBA1C 5.8 (H) 10/09/2018   LDL:  Lab Results  Component Value Date   LDLCALC 37 05/25/2022   Urine Drug Screen: No results found for: "LABOPIA", "COCAINSCRNUR", "LABBENZ", "AMPHETMU", "THCU", "LABBARB"  Alcohol Level     Component Value Date/Time   ETH <10 06/15/2022 0830   No results found for: "PHENYTOIN", "ZONISAMIDE", "LAMOTRIGINE", "LEVETIRACETA" No results found for: "PHENYTOIN", "PHENOBARB", "VALPROATE", "CBMZ"  Imaging and  Diagnostic studies  Results for orders placed during the hospital encounter of 06/15/22  MR BRAIN WO CONTRAST  Narrative CLINICAL DATA:  Neuro deficit, acute, stroke suspected. Slurred speech.  EXAM: MRI HEAD WITHOUT CONTRAST  TECHNIQUE: Multiplanar, multiecho pulse sequences of the brain and surrounding structures were obtained without intravenous contrast.  COMPARISON:  Head CT and CTA 06/15/2022  FINDINGS: Brain: There are punctate  foci of mildly restricted diffusion involving white matter of the posterior left temporal lobe, left occipital lobe, and posterior right frontal lobe as well as cortex in the right postcentral gyrus. A few small chronic bilateral cerebral microhemorrhages are noted. Small T2 hyperintensities in the cerebral white matter bilaterally are nonspecific but compatible with minimal chronic small vessel ischemic disease. No mass, midline shift, or extra-axial fluid collection is evident. There is mild cerebral and cerebellar atrophy. There may be a tiny chronic right cerebellar infarct.  Vascular: Major intracranial vascular flow voids are preserved.  Skull and upper cervical spine: Unremarkable bone marrow signal.  Sinuses/Orbits: Unremarkable orbits. Paranasal sinuses and mastoid air cells are clear.  Other: None.  IMPRESSION: Scattered punctate acute to subacute bilateral cerebral infarcts primarily involving white matter.   Electronically Signed By: Sebastian Ache M.D. On: 06/16/2022 16:22   Impression   [name] is a 72 y/o person living with a history of atrial flutter on anticoagulation, CAD CABG x 2 vessel, foramen ovale closure, s/p aortic and MVR bioprosthetic valves, s/p pacemaker placement, tobacco use, HLD who presented with acute thrombus to the LUE s/p urgent surgical intervention. 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   His neurologic examination is benign and without focal deficits.  Continue antibiotic regimen per primary team and infectious disease.  He has been on anticoagulation for the past few days and there is no evidence of hemorrhagic transformation. While recently transitioned from coumadin to eliquis for his atrial flutter do not suspect this contributed to his CVA's. Distribution more consistent with embolic etiology.    Recommendations   -Continue antibiotics per infectious disease -Monitor for new neurological deficits and if any occur stat head CT without contrast to evaluate for hemorrhagic transformation -PT OT and SLP evaluations. ______________________________________________________________________   Thank you for the opportunity to take part in the care of this patient. If you have any further questions, please contact the neurology consultation attending.  Signed,  Thalia Bloodgood DO  Internal Medicine Resident PGY-3 [facility]  Pager: [phone]

## 2022-06-17 NOTE — Hospital Course (Addendum)
Mr. Ocasio is a 71 yo male with PMH atrial fib, PAD, CAD s/p CABG, CHB s/p PPM, obesity, AV replacement (bioprosthetic), MV replacement (bioprosthetic), HLD, prostate cancer who initially presented to the hospital on 06/15/2022 with left hand numbness and pain. He was admitted and underwent evaluation with vascular surgery.  He was found to have left upper extremity ischemia with CTA evidence of acute occlusion of the high left proximal brachial artery at the level of the left humeral neck. He then underwent left upper extremity thromboembolectomy via brachial and radial artery approach on 06/15/2022.  He recently had cardiac workup on 05/23/2022 after presenting with shortness of breath and found to have elevated troponin and BNP.  He underwent cardiac cath on 05/25/2022 which showed severe mid LAD stenosis.  It was also noted that left radial artery was unable to be accessed.  His symptoms were felt to be due to severe TR and he was referred to outpatient advanced heart failure clinic.  He was also changed from Coumadin to Eliquis.  Due to left brachial artery occlusion, cardiology was consulted to evaluate for possible cardiac source of embolus. He underwent TEE on 06/16/2022 which revealed a large vegetation involving mitral valve and small subcentimeter vegetation involving aortic valve.  Blood cultures on admission then became positive with E faecalis in 2/3 bottles.  He had initially been started on antibiotics on admission which were transitioned to ampicillin and Rocephin after blood cultures resulted.

## 2022-06-17 NOTE — Progress Notes (Signed)
ANTICOAGULATION CONSULT NOTE - Initial Consult  Pharmacy Consult for heparin Indication: Left brachial artery occlusion and CVA  No Known Allergies  Patient Measurements: Height: 5\' 7"  (170.2 cm) Weight: 94.8 kg (209 lb) IBW/kg (Calculated) : 66.1 HEPARIN DW (KG): 86.3   Vital Signs: Temp: 97.5 F (36.4 C) (05/09 0746) Temp Source: Oral (05/09 0746) BP: 122/73 (05/09 0746) Pulse Rate: 61 (05/09 0746)  Labs: Recent Labs    06/15/22 0835 06/15/22 0845 06/15/22 2242 06/16/22 0130 06/16/22 1309 06/17/22 0155  HGB 11.9* 10.4* 10.1*  --  9.7*  --   HCT 35.0* 34.8* 32.0*  --  31.9*  --   PLT  --  79* 84*  --  58*  --   APTT  --  43*  --  39*  --  33  LABPROT  --  22.8* 19.2*  --   --   --   INR  --  2.0* 1.6*  --   --   --   CREATININE 0.90  --  1.08 1.21  --   --     Estimated Creatinine Clearance: 61.5 mL/min (by C-G formula based on SCr of 1.21 mg/dL).   Medical History: Past Medical History:  Diagnosis Date   BCE (basal cell epithelioma), arm    RIGHT SHOULDER   Bradycardia    Bumps on skin    on left side of nose for last 3 weeks   CAD (coronary artery disease)    a. s/p CABGx2 10/11/18 LIMA to LAD, SVG to RCA, EVH via right thigh.   Dyslipidemia    Ectatic thoracic aorta (HCC)    Esophageal stricture    GERD (gastroesophageal reflux disease)    Heart murmur    sees dr Rose Fillers   Hyperlipidemia    Incidental pulmonary nodule, > 3mm and < 8mm 10/04/2018   Noted on CTA   Inguinal hernia    bilateral   Obesity    Presence of permanent cardiac pacemaker    Prostate cancer (HCC)    S/P aortic valve replacement with bioprosthetic valve 10/11/2018   23 mm Edwards Inspiris Resilia stented bovine pericardial tissue valve, miltral valve done also   S/P CABG x 2 10/11/2018   LIMA to LAD, SVG to RCA, EVH via right thigh   S/P mitral valve replacement with bioprosthetic valve 10/11/2018   27 mm Medtronic Mosaic stented porcine bioprosthetic tissue valve   S/P  patent foramen ovale closure 10/11/2018   S/P placement of cardiac pacemaker    Smoker     Medications:  Medications Prior to Admission  Medication Sig Dispense Refill Last Dose   acetaminophen (TYLENOL) 325 MG tablet Take 650 mg by mouth as needed for moderate pain.   Unk   apixaban (ELIQUIS) 5 MG TABS tablet Take 1 tablet (5 mg total) by mouth 2 (two) times daily. 60 tablet 1 06/14/2022 at 1900   atorvastatin (LIPITOR) 20 MG tablet Take 1 tablet (20 mg total) by mouth daily. 90 tablet 3 06/14/2022   digoxin (LANOXIN) 0.125 MG tablet Take 1 tablet (0.125 mg total) by mouth daily. 90 tablet 3 06/14/2022   empagliflozin (JARDIANCE) 10 MG TABS tablet Take 1 tablet (10 mg total) by mouth daily before breakfast. 30 tablet 11 06/14/2022   furosemide (LASIX) 20 MG tablet Take 20 mg by mouth 2 (two) times daily.   06/14/2022   Iron, Ferrous Sulfate, 325 (65 Fe) MG TABS Take 1 tablet by mouth 2 (two) times daily. (Patient taking differently:  Take 1 tablet by mouth daily.) 60 tablet 1 06/14/2022   metoprolol tartrate (LOPRESSOR) 25 MG tablet Take 1.5 tablets (37.5 mg total) by mouth 2 (two) times daily. 180 tablet 3 06/14/2022 at 1900   oxymetazoline (AFRIN) 0.05 % nasal spray Place 1 spray into both nostrils at bedtime as needed for congestion.      polyethylene glycol powder (GLYCOLAX/MIRALAX) 17 GM/SCOOP powder Take 17 g by mouth daily as needed for mild constipation. 238 g 0 Unk   Scheduled:   atorvastatin  20 mg Oral Daily   digoxin  0.125 mg Oral Daily   docusate sodium  100 mg Oral BID   empagliflozin  10 mg Oral QAC breakfast   furosemide  20 mg Oral BID   metoprolol tartrate  37.5 mg Oral BID   pantoprazole  40 mg Oral Daily   sodium chloride flush  3 mL Intravenous Once   sodium chloride flush  3 mL Intravenous Q12H    Assessment: 72 yo male with left brachial artery occlusion s/p thrombectomy on fixed dose heparin at 500 units/hr. He is noted with history of afib and was on apixaban PTA.  Pharmacy  consulted to dose heparin -MRI noted with cerebral infarcts  -hg= 9.7, plt= 58 (likely in setting of bacteremia)   Goal of Therapy:  Heparin level 0.3-0.5 units/ml Monitor platelets by anticoagulation protocol: Yes   Plan:  -No heparin bolus -Increase heparin to 700 units/hr -Heparin level in 8 hours and daily wth CBC daily  Harland German, PharmD Clinical Pharmacist **Pharmacist phone directory can now be found on [email or web address] ([email or web address]).  Listed under [facility].

## 2022-06-18 DIAGNOSIS — I639 Cerebral infarction, unspecified: Secondary | ICD-10-CM

## 2022-06-18 DIAGNOSIS — B952 Enterococcus as the cause of diseases classified elsewhere: Secondary | ICD-10-CM

## 2022-06-18 DIAGNOSIS — I5032 Chronic diastolic (congestive) heart failure: Secondary | ICD-10-CM | POA: Diagnosis not present

## 2022-06-18 DIAGNOSIS — I998 Other disorder of circulatory system: Secondary | ICD-10-CM | POA: Diagnosis not present

## 2022-06-18 DIAGNOSIS — I70208 Unspecified atherosclerosis of native arteries of extremities, other extremity: Secondary | ICD-10-CM | POA: Diagnosis not present

## 2022-06-18 DIAGNOSIS — R7881 Bacteremia: Secondary | ICD-10-CM

## 2022-06-18 DIAGNOSIS — I330 Acute and subacute infective endocarditis: Secondary | ICD-10-CM | POA: Diagnosis not present

## 2022-06-18 DIAGNOSIS — I7090 Unspecified atherosclerosis: Secondary | ICD-10-CM | POA: Diagnosis not present

## 2022-06-18 LAB — CBC WITH DIFFERENTIAL/PLATELET
Abs Immature Granulocytes: 0.94 10*3/uL — ABNORMAL HIGH (ref 0.00–0.07)
Basophils Absolute: 0.1 10*3/uL (ref 0.0–0.1)
Basophils Relative: 1 %
Eosinophils Absolute: 0.1 10*3/uL (ref 0.0–0.5)
Eosinophils Relative: 0 %
HCT: 34.8 % — ABNORMAL LOW (ref 39.0–52.0)
Hemoglobin: 10.7 g/dL — ABNORMAL LOW (ref 13.0–17.0)
Immature Granulocytes: 5 %
Lymphocytes Relative: 7 %
Lymphs Abs: 1.2 10*3/uL (ref 0.7–4.0)
MCH: 25.7 pg — ABNORMAL LOW (ref 26.0–34.0)
MCHC: 30.7 g/dL (ref 30.0–36.0)
MCV: 83.5 fL (ref 80.0–100.0)
Monocytes Absolute: 0.9 10*3/uL (ref 0.1–1.0)
Monocytes Relative: 5 %
Neutro Abs: 14.2 10*3/uL — ABNORMAL HIGH (ref 1.7–7.7)
Neutrophils Relative %: 82 %
Platelets: 88 10*3/uL — ABNORMAL LOW (ref 150–400)
RBC: 4.17 MIL/uL — ABNORMAL LOW (ref 4.22–5.81)
RDW: 20.4 % — ABNORMAL HIGH (ref 11.5–15.5)
WBC: 17.3 10*3/uL — ABNORMAL HIGH (ref 4.0–10.5)
nRBC: 1.5 % — ABNORMAL HIGH (ref 0.0–0.2)

## 2022-06-18 LAB — BASIC METABOLIC PANEL
Anion gap: 10 (ref 5–15)
BUN: 19 mg/dL (ref 8–23)
CO2: 21 mmol/L — ABNORMAL LOW (ref 22–32)
Calcium: 8.3 mg/dL — ABNORMAL LOW (ref 8.9–10.3)
Chloride: 102 mmol/L (ref 98–111)
Creatinine, Ser: 1 mg/dL (ref 0.61–1.24)
GFR, Estimated: >60 mL/min (ref >60)
Glucose, Bld: 103 mg/dL — ABNORMAL HIGH (ref 70–99)
Potassium: 3.4 mmol/L — ABNORMAL LOW (ref 3.5–5.1)
Sodium: 133 mmol/L — ABNORMAL LOW (ref 135–145)

## 2022-06-18 LAB — HEPARIN LEVEL (UNFRACTIONATED): Heparin Unfractionated: 0.31 IU/mL (ref 0.30–0.70)

## 2022-06-18 LAB — MAGNESIUM: Magnesium: 2.4 mg/dL (ref 1.7–2.4)

## 2022-06-18 LAB — CULTURE, BLOOD (ROUTINE X 2)

## 2022-06-18 MED ORDER — FUROSEMIDE 40 MG PO TABS
40.0000 mg | ORAL_TABLET | Freq: Every day | ORAL | Status: DC
  Administered 2022-06-19 – 2022-06-22 (×3): 40 mg via ORAL
  Filled 2022-06-18 (×4): qty 1

## 2022-06-18 NOTE — Evaluation (Signed)
Physical Therapy Evaluation Patient Details Name: [name] MRN: [id number] DOB: 11/06/1950 Today's Date: 06/18/2022  History of Present Illness  72 yo admitted on 5/7 for code stroke secondary to numbness and heaviness in L UE. CT/CTA of the head and neck which were negative.  CTA of the chest does reveal a high right brachial arterial occlusion. S/p 5/7 L UE thromboembolectomy and angiogram with catheter selection of left subclavian and axillary arteries s/p5/8 TEE with concern for MV vegetation. PMHx:  NSTEMI 4/24, anemia, pacemaker, PFO closure, LAA clip, complete heart block, a-flutter, HLD, prostate CA,  Clinical Impression  PTA pt living with wife in multistory home with steps to enter and bed and bath on first floor. Pt reports since hospitalization in April, he has been using a cane for limited community ambulation and days when he is not feeling steady. Pt reports independence with ADLs, pt's wife is assisting with iADLs. Pt is currently limited in safe mobility by decreased use of L UE, in presence of generalized weakness and associated balance deficits. PT will continue to follow acutely and will refer to Mobility Specialist to progress mobility.        Recommendations for follow up therapy are one component of a multi-disciplinary discharge planning process, led by the attending physician.  Recommendations may be updated based on patient status, additional functional criteria and insurance authorization.     Assistance Recommended at Discharge Intermittent Supervision/Assistance  Patient can return home with the following  A little help with walking and/or transfers;A little help with bathing/dressing/bathroom;Assistance with cooking/housework;Direct supervision/assist for medications management;Direct supervision/assist for financial management;Assist for transportation;Help with stairs or ramp for entrance    Equipment Recommendations None recommended by PT  Recommendations for  Other Services       Functional Status Assessment Patient has had a recent decline in their functional status and demonstrates the ability to make significant improvements in function in a reasonable and predictable amount of time.     Precautions / Restrictions Precautions Precautions: Fall Restrictions Weight Bearing Restrictions: Yes LUE Weight Bearing: Non weight bearing (pt self reports he is not supposed to use L UE however no orders noted)      Mobility  Bed Mobility Overal bed mobility: Needs Assistance Bed Mobility: Supine to Sit     Supine to sit: HOB elevated, Min assist     General bed mobility comments: min A for scooting hips to EoB with pad secondary to decreased weightbearing through L UE    Transfers Overall transfer level: Needs assistance Equipment used: Rolling walker (2 wheels) Transfers: Sit to/from Stand Sit to Stand: Min guard, From elevated surface           General transfer comment: min guard for safety, vc for hand placement    Ambulation/Gait Ambulation/Gait assistance: Min guard Gait Distance (Feet): 12 Feet Assistive device: Rolling walker (2 wheels) Gait Pattern/deviations: Step-through pattern, Decreased step length - right, Decreased step length - left, Narrow base of support, Trunk flexed Gait velocity: reduced Gait velocity interpretation: <1.31 ft/sec, indicative of household ambulator   General Gait Details: min guard for safety, limited to ambulation around bed secondary to SLP and MD in room      Balance Overall balance assessment: Needs assistance Sitting-balance support: Feet supported Sitting balance-Leahy Scale: Good     Standing balance support: Bilateral upper extremity supported, During functional activity, Single extremity supported Standing balance-Leahy Scale: Fair  Pertinent Vitals/Pain Pain Assessment Pain Assessment: Faces Faces Pain Scale: Hurts a little  bit Pain Location: low back from being in bed Pain Descriptors / Indicators: Aching, Sore Pain Intervention(s): Repositioned    Home Living Family/patient expects to be discharged to:: Private residence Living Arrangements: Spouse/significant other Available Help at Discharge: Family Type of Home: House Home Access: Stairs to enter Entrance Stairs-Rails: Right Entrance Stairs-Number of Steps: 2   Home Layout: Multi-level;Able to live on main level with bedroom/bathroom Home Equipment: Rolling Walker (2 wheels);Cane - single point      Prior Function Prior Level of Function : Independent/Modified Independent             Mobility Comments: since prior hospitalization using cane on days he feels unsteady, and has not driven either ADLs Comments: independent in ADLs, wife manages medication and performs iADLs        Extremity/Trunk Assessment        Lower Extremity Assessment Lower Extremity Assessment: Generalized weakness;Overall WFL for tasks assessed (reports L LE pain during last hospitalization is resolved)    Cervical / Trunk Assessment Cervical / Trunk Assessment: Normal  Communication   Communication: No difficulties  Cognition Arousal/Alertness: Awake/alert Behavior During Therapy: WFL for tasks assessed/performed Overall Cognitive Status: Within Functional Limits for tasks assessed                                          General Comments General comments (skin integrity, edema, etc.): VSS on RA, 2/4 DoE and HR 110s with ambulation around bed        Assessment/Plan    PT Assessment Patient needs continued PT services  PT Problem List Decreased strength;Decreased range of motion;Decreased activity tolerance;Decreased balance;Decreased mobility;Cardiopulmonary status limiting activity       PT Treatment Interventions DME instruction;Gait training;Stair training;Functional mobility training;Therapeutic activities;Therapeutic  exercise;Balance training;Cognitive remediation;Patient/family education    PT Goals (Current goals can be found in the Care Plan section)  Acute Rehab PT Goals Patient Stated Goal: get back to driving PT Goal Formulation: With patient Time For Goal Achievement: 07/02/22 Potential to Achieve Goals: Good    Frequency Min 1X/week        AM-PAC PT "6 Clicks" Mobility  Outcome Measure Help needed turning from your back to your side while in a flat bed without using bedrails?: None Help needed moving from lying on your back to sitting on the side of a flat bed without using bedrails?: A Little Help needed moving to and from a bed to a chair (including a wheelchair)?: A Little Help needed standing up from a chair using your arms (e.g., wheelchair or bedside chair)?: A Little Help needed to walk in hospital room?: A Little Help needed climbing 3-5 steps with a railing? : A Lot 6 Click Score: 18    End of Session Equipment Utilized During Treatment: Gait belt Activity Tolerance: Patient tolerated treatment well Patient left: in chair;with call bell/phone within reach;with chair alarm set Nurse Communication: Mobility status PT Visit Diagnosis: Muscle weakness (generalized) (M62.81);Difficulty in walking, not elsewhere classified (R26.2)    Time: 4098-1191 PT Time Calculation (min) (ACUTE ONLY): 22 min   Charges:   PT Evaluation $PT Eval Moderate Complexity: 1 Mod          Linlee Cromie B. Beverely Risen PT, DPT Acute Rehabilitation Services Please use secure chat or  Call Office [phone]  Elon Alas Fleet 06/18/2022, 9:38 AM

## 2022-06-18 NOTE — Progress Notes (Signed)
  Transition of Care [facility]) Screening Note   Patient Details  Name: [name] Date of Birth: [birth year]   Transition of Care [facility]) CM/SW Contact:    Darrold Span, RN Phone Number: 06/18/2022, 4:17 PM    Transition of Care Department [facility]) has reviewed patient and note pt w/ Endocarditis E. Faecalis bacteremia - underwent cardiology eval for cardiac embolic source for LUE arterial thrombus - s/p TEE on 06/16/22 positive for AV and MV vegetations. ID following for abx needs Pt active with [facility] for [facility] prior to admit We will continue to monitor patient advancement through interdisciplinary progression rounds. If new patient transition needs arise, please place a TOC consult.

## 2022-06-18 NOTE — Evaluation (Signed)
Occupational Therapy Evaluation Patient Details Name: [name] MRN: [id number] DOB: 1950-11-29 Today's Date: 06/18/2022   History of Present Illness 72 yo admitted on 5/7 for code stroke secondary to numbness and heaviness in L UE. CT/CTA of the head and neck which were negative.  CTA of the chest does reveal a high right brachial arterial occlusion. S/p 5/7 L UE thromboembolectomy and angiogram with catheter selection of left subclavian and axillary arteries s/p5/8 TEE with concern for MV vegetation. PMHx:  NSTEMI 4/24, anemia, pacemaker, PFO closure, LAA clip, complete heart block, a-flutter, HLD, prostate CA,   Clinical Impression   Patient admitted for the diagnosis above.  PTA he lives at home with his spouse, who is able to provide supportive services.  Patient main complaint is lower extremity weakness.  Currently he is needing Min Guard for mobility and up to Mod A for lower body ADL from a sit to stand level.  OT is indicated in the acute setting to address deficits, and HH OT is recommended at his time.        Recommendations for follow up therapy are one component of a multi-disciplinary discharge planning process, led by the attending physician.  Recommendations may be updated based on patient status, additional functional criteria and insurance authorization.   Assistance Recommended at Discharge Intermittent Supervision/Assistance  Patient can return home with the following Assist for transportation;Assistance with cooking/housework;A little help with walking and/or transfers;A lot of help with bathing/dressing/bathroom    Functional Status Assessment  Patient has had a recent decline in their functional status and demonstrates the ability to make significant improvements in function in a reasonable and predictable amount of time.  Equipment Recommendations  None recommended by OT    Recommendations for Other Services       Precautions / Restrictions  Precautions Precautions: Fall Precaution Comments: monitor HR and O2 Restrictions Weight Bearing Restrictions: Yes LUE Weight Bearing: Non weight bearing (pt self reports he is not supposed to use L UE however no orders noted) Other Position/Activity Restrictions: No formal orders that restrict ROM or WB.  Advised to move to tolerance: gentle ROM throughout the day.      Mobility Bed Mobility   Bed Mobility: Supine to Sit     Supine to sit: Supervision          Transfers Overall transfer level: Needs assistance Equipment used: Rolling walker (2 wheels) Transfers: Sit to/from Stand, Bed to chair/wheelchair/BSC Sit to Stand: Min assist     Step pivot transfers: Min guard            Balance Overall balance assessment: Needs assistance Sitting-balance support: Feet supported Sitting balance-Leahy Scale: Good     Standing balance support: Reliant on assistive device for balance Standing balance-Leahy Scale: Fair                             ADL either performed or assessed with clinical judgement   ADL Overall ADL's : Needs assistance/impaired     Grooming: Wash/dry hands;Wash/dry face;Set up;Sitting               Lower Body Dressing: Moderate assistance;Sit to/from stand   Toilet Transfer: Min guard;Rolling walker (2 wheels);Stand-pivot;BSC/3in1                   Vision Baseline Vision/History: 1 Wears glasses Patient Visual Report: No change from baseline       Perception  Praxis      Pertinent Vitals/Pain Pain Assessment Pain Assessment: No/denies pain     Hand Dominance Right   Extremity/Trunk Assessment Upper Extremity Assessment Upper Extremity Assessment: LUE deficits/detail LUE Deficits / Details: swelling, brusied with hematoma noted to forearm. LUE Sensation: decreased light touch LUE Coordination: WNL   Lower Extremity Assessment Lower Extremity Assessment: Defer to PT evaluation   Cervical / Trunk  Assessment Cervical / Trunk Assessment: Normal   Communication Communication Communication: No difficulties   Cognition Arousal/Alertness: Awake/alert Behavior During Therapy: WFL for tasks assessed/performed Overall Cognitive Status: Within Functional Limits for tasks assessed                                       General Comments  VSS on RA, DOE with minimal activity.      Exercises     Shoulder Instructions      Home Living Family/patient expects to be discharged to:: Private residence Living Arrangements: Spouse/significant other Available Help at Discharge: Family Type of Home: House Home Access: Stairs to enter [employer] of Steps: 2 Entrance Stairs-Rails: Right Home Layout: Multi-level;Able to live on main level with bedroom/bathroom     Bathroom Shower/Tub: Chief Strategy Officer: Standard Bathroom Accessibility: Yes   Home Equipment: Agricultural consultant (2 wheels);Cane - single point          Prior Functioning/Environment Prior Level of Function : Independent/Modified Independent             Mobility Comments: since prior hospitalization using cane on days he feels unsteady, and has not driven either ADLs Comments: independent in ADLs, wife manages medication and performs iADLs        OT Problem List: Decreased strength;Decreased activity tolerance;Impaired balance (sitting and/or standing)      OT Treatment/Interventions: Self-care/ADL training;Therapeutic exercise;Therapeutic activities;Patient/family education;DME and/or AE instruction;Balance training    OT Goals(Current goals can be found in the care plan section) Acute Rehab OT Goals Patient Stated Goal: Get stronger again OT Goal Formulation: With patient Time For Goal Achievement: 07/02/22 Potential to Achieve Goals: Good ADL Goals Pt Will Perform Grooming: with modified independence;standing Pt Will Perform Lower Body Dressing: with modified  independence;sit to/from stand Pt Will Transfer to Toilet: with modified independence;regular height toilet;ambulating Pt/caregiver will Perform Home Exercise Program: Increased ROM;Left upper extremity;With Supervision  OT Frequency: Min 2X/week    Co-evaluation              AM-PAC OT "6 Clicks" Daily Activity     Outcome Measure Help from another person eating meals?: None Help from another person taking care of personal grooming?: None Help from another person toileting, which includes using toliet, bedpan, or urinal?: A Little Help from another person bathing (including washing, rinsing, drying)?: A Lot Help from another person to put on and taking off regular upper body clothing?: A Little Help from another person to put on and taking off regular lower body clothing?: A Lot 6 Click Score: 18   End of Session Equipment Utilized During Treatment: Rolling walker (2 wheels) Nurse Communication: Mobility status  Activity Tolerance: Patient tolerated treatment well Patient left: in chair;with call bell/phone within reach;with chair alarm set;with family/visitor present  OT Visit Diagnosis: Unsteadiness on feet (R26.81);Muscle weakness (generalized) (M62.81)                Time: 1019-1040 OT Time Calculation (min): 21 min Charges:  OT General Charges $OT Visit: 1 Visit OT Evaluation $OT Eval Moderate Complexity: 1 Mod  06/18/2022  RP, OTR/L  Acute Rehabilitation Services  Office:  [phone]   Suzanna Obey 06/18/2022, 10:48 AM

## 2022-06-18 NOTE — Evaluation (Signed)
Clinical/Bedside Swallow Evaluation Patient Details  Name: [name] MRN: [id number] Date of Birth: Jun 28, 1950  Today's Date: 06/18/2022 Time: SLP Start Time (ACUTE ONLY): 0906 SLP Stop Time (ACUTE ONLY): 0913 SLP Time Calculation (min) (ACUTE ONLY): 7 min  Past Medical History:  Past Medical History:  Diagnosis Date   BCE (basal cell epithelioma), arm    RIGHT SHOULDER   Bradycardia    Bumps on skin    on left side of nose for last 3 weeks   CAD (coronary artery disease)    a. s/p CABGx2 10/11/18 LIMA to LAD, SVG to RCA, EVH via right thigh.   Dyslipidemia    Ectatic thoracic aorta (HCC)    Esophageal stricture    GERD (gastroesophageal reflux disease)    Heart murmur    sees dr Rose Fillers   Hyperlipidemia    Incidental pulmonary nodule, > 3mm and < 8mm 10/04/2018   Noted on CTA   Inguinal hernia    bilateral   Obesity    Presence of permanent cardiac pacemaker    Prostate cancer (HCC)    S/P aortic valve replacement with bioprosthetic valve 10/11/2018   23 mm Edwards Inspiris Resilia stented bovine pericardial tissue valve, miltral valve done also   S/P CABG x 2 10/11/2018   LIMA to LAD, SVG to RCA, EVH via right thigh   S/P mitral valve replacement with bioprosthetic valve 10/11/2018   27 mm Medtronic Mosaic stented porcine bioprosthetic tissue valve   S/P patent foramen ovale closure 10/11/2018   S/P placement of cardiac pacemaker    Smoker    Past Surgical History:  Past Surgical History:  Procedure Laterality Date   AORTIC VALVE REPLACEMENT N/A 10/11/2018   Procedure: AORTIC VALVE REPLACEMENT (AVR) with 23 Inspiris Bioprosthetic Aortic valve.;  Surgeon: Purcell Nails, MD;  Location: [facility] OR;  Service: Open Heart Surgery;  Laterality: N/A;   AV FISTULA PLACEMENT Left 06/15/2022   Procedure: LEFT UPPER EXTREMITY THROMBECTOMY;  Surgeon: Maeola Harman, MD;  Location[facility] OR;  Service: Vascular;  Laterality: Left;   CLIPPING OF ATRIAL APPENDAGE N/A 10/11/2018    Procedure: CLIPPING OF LEFT ATRIAL APPENDAGE with 45 AtriCure Clip.;  Surgeon: Purcell Nails, MD;  Location: [facility] OR;  Service: Open Heart Surgery;  Laterality: N/A;   COLONOSCOPY  03/2008,2004   Dr. Madilyn Fireman   CORONARY ARTERY BYPASS GRAFT N/A 10/11/2018   Procedure: CORONARY ARTERY BYPASS GRAFTING (CABG) x 2 using LIMA to the LAD and endoscopic greater saphenous vein harvesting to the RCA.;  Surgeon: Purcell Nails, MD;  Location: [facility] OR;  Service: Open Heart Surgery;  Laterality: N/A;   CYSTOSCOPY  04/04/2019   Procedure: CYSTOSCOPY;  Surgeon: Rene Paci, MD;  Location: [facility];  Service: Urology;;   HERNIA REPAIR  12/11/10   BIH   MITRAL VALVE REPLACEMENT N/A 10/11/2018   Procedure: MITRAL VALVE (MV) REPLACEMENT with 27 Mosaic Bioprosthetic Mitral valve.;  Surgeon: Purcell Nails, MD;  Location: [facility] OR;  Service: Open Heart Surgery;  Laterality: N/A;   PACEMAKER IMPLANT N/A 10/17/2018   Procedure: PACEMAKER IMPLANT;  Surgeon: Hillis Range, MD;  Location: [facility] INVASIVE CV LAB;  Service: Cardiovascular;  Laterality: N/A;   RADIOACTIVE SEED IMPLANT N/A 04/04/2019   Procedure: RADIOACTIVE SEED IMPLANT/BRACHYTHERAPY IMPLANT, cystoscopy;  Surgeon: Rene Paci, MD;  Location: [facility] [place];  Service: Urology;  Laterality: N/A;  58 seeds   REPAIR OF PATENT FORAMEN OVALE N/A 10/11/2018   Procedure: CLOSURE  OF PATENT FORAMEN OVALE;  Surgeon: Purcell Nails, MD;  Location: [facility] OR;  Service: Open Heart Surgery;  Laterality: N/A;   RIGHT/LEFT HEART CATH AND CORONARY ANGIOGRAPHY N/A 09/15/2018   Procedure: RIGHT/LEFT HEART CATH AND CORONARY ANGIOGRAPHY;  Surgeon: Kathleene Hazel, MD;  Location: [facility] INV[facility] CV LAB;  Service: Cardiovascular;  Laterality: N/A;   RIGHT/LEFT HEART CATH AND CORONARY/GRAFT ANGIOGRAPHY N/A 05/25/2022   Procedure: RIGHT/LEFT HEART CATH AND CORONARY/GRAFT ANGIOGRAPHY;  Surgeon: Kathleene Hazel, MD;  Location: [facility] INVAS[facility]  CV LAB;  Service: Cardiovascular;  Laterality: N/A;   TEE WITHOUT CARDIOVERSION N/A 12/12/2015   Procedure: TRANSESOPHAGEAL ECHOCARDIOGRAM (TEE);  Surgeon: Thurmon Fair, MD;  Location: [facility] ENDOSCOPY;  Service: Cardiovascular;  Laterality: N/A;   TEE WITHOUT CARDIOVERSION N/A 10/11/2018   Procedure: TRANSESOPHAGEAL ECHOCARDIOGRAM (TEE);  Surgeon: Purcell Nails, MD;  Location: [facility] OR;  Service: Open Heart Surgery;  Laterality: N/A;   TEE WITHOUT CARDIOVERSION N/A 06/16/2022   Procedure: TRANSESOPHAGEAL ECHOCARDIOGRAM;  Surgeon: Dorthula Nettles, DO;  Location: [facility] INVASIVE CV LAB;  Service: Cardiovascular;  Laterality: N/A;   THYMECTOMY  1976   radaiation tx done   HPI:  Mr. Kneifl is a 72 y/o person with a history of atrial flutter on anticoagulation, CAD CABG x 2 vessel, foramen ovale closure, s/p aortic and MVR bioprosthetic valves, s/p pacemaker placement, tobacco use, HLD who presented with acute thrombus to the LUE s/p urgent surgical intervention. 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    Assessment / Plan / Recommendation  Clinical Impression  Pt presents with normal oropharyngeal swallow function. Strong volitional cough, no impairments during oromotor exam. Pt denies history of dysphagia and states currently if feels like mucous gets stuck in his throat at times. Swallows appeared brisk without s/s aspiration across textures. He denied globus sensation. Recommend continue regular texture, thin liquids, pills with thin and no further ST is needed at this time. SLP Visit Diagnosis: Dysphagia, unspecified (R13.10)    Aspiration Risk  No limitations    Diet Recommendation Regular;Thin liquid   Liquid Administration via: Straw;Cup Medication Administration: Whole meds with liquid Supervision: Patient able to self feed Compensations: Slow rate;Small sips/bites Postural Changes: Seated upright at 90  degrees    Other  Recommendations Oral Care Recommendations: Oral care BID    Recommendations for follow up therapy are one component of a multi-disciplinary discharge planning process, led by the attending physician.  Recommendations may be updated based on patient status, additional functional criteria and insurance authorization.  Follow up Recommendations No SLP follow up      Assistance Recommended at Discharge    Functional Status Assessment Patient has not had a recent decline in their functional status  Frequency and Duration            Prognosis        Swallow Study   General HPI: Mr. Arlinghaus is a 72 y/o person with a history of atrial flutter on anticoagulation, CAD CABG x 2 vessel, foramen ovale closure, s/p aortic and MVR bioprosthetic valves, s/p pacemaker placement, tobacco use, HLD who presented with acute thrombus to the LUE s/p urgent surgical intervention. 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 Type of Study: Bedside Swallow Evaluation Previous Swallow Assessment:  (none) Diet Prior to this Study: Regular;Thin liquids (Level 0) Temperature Spikes Noted: No Respiratory Status:  Room air History of Recent Intubation: No Behavior/Cognition: Alert;Cooperative;Pleasant mood Oral Cavity Assessment: Within Functional Limits Oral Care Completed by SLP: No Oral Cavity - Dentition: Adequate natural dentition Vision: Functional for self-feeding Self-Feeding Abilities: Able to feed self Patient Positioning: Upright in chair Baseline Vocal Quality: Normal Volitional Cough: Strong Volitional Swallow: Able to elicit    Oral/Motor/Sensory Function Overall Oral Motor/Sensory Function: Within functional limits   Ice Chips Ice chips: Not tested   Thin Liquid Thin Liquid: Within functional limits Presentation: Straw    Nectar Thick Nectar Thick Liquid: Not tested   Honey Thick Honey  Thick Liquid: Not tested   Puree Puree: Within functional limits   Solid     Solid: Within functional limits      Royce Macadamia 06/18/2022,9:21 AM

## 2022-06-18 NOTE — Care Management Important Message (Signed)
Important Message  Patient Details  Name: [name] MRN: [id number] Date of Birth: 25-Oct-1950   Medicare Important Message Given:  Yes     [name] 06/18/2022, 10:22 AM

## 2022-06-18 NOTE — Progress Notes (Signed)
Rounding Note    Patient Name: [name] Date of Encounter: 06/18/2022  [facility] Cardiologist: Verne Carrow, MD   Subjective   Feels okay this morning. States the lasix keeps him up at night due to increased urinary frequency. No chest pain or SOB.  Seen by CT surgery and deemed poor surgical candidate. Recommended for continued medical therapy.  Inpatient Medications    Scheduled Meds:  atorvastatin  20 mg Oral Daily   digoxin  0.125 mg Oral Daily   docusate sodium  100 mg Oral BID   empagliflozin  10 mg Oral QAC breakfast   [START ON 06/19/2022] furosemide  40 mg Oral Daily   metoprolol tartrate  37.5 mg Oral BID   pantoprazole  40 mg Oral Daily   sodium chloride flush  3 mL Intravenous Once   sodium chloride flush  3 mL Intravenous Q12H   Continuous Infusions:  sodium chloride     sodium chloride Stopped (06/16/22 1826)   ampicillin (OMNIPEN) IV 2 g (06/18/22 1136)   cefTRIAXone (ROCEPHIN)  IV 2 g (06/18/22 0944)   heparin 700 Units/hr (06/17/22 1015)   PRN Meds: sodium chloride, sodium chloride, acetaminophen **OR** acetaminophen, alum & mag hydroxide-simeth, bisacodyl, guaiFENesin-dextromethorphan, metoprolol tartrate, morphine injection, ondansetron, phenol, senna-docusate, sodium chloride flush, traMADol   Vital Signs    Vitals:   06/17/22 2121 06/18/22 0002 06/18/22 0327 06/18/22 0756  BP: (!) 141/84 134/70 (!) 159/91 (!) 149/76  Pulse: 95 89 94 99  Resp: 20 20 16 19   Temp: (!) 97.5 F (36.4 C) 98.1 F (36.7 C) 97.6 F (36.4 C) 97.8 F (36.6 C)  TempSrc: Oral Oral Oral Oral  SpO2: 94% 95% 98% 96%  Weight:      Height:        Intake/Output Summary (Last 24 hours) at 06/18/2022 1211 Last data filed at 06/18/2022 2956 Gross per 24 hour  Intake 1123.45 ml  Output 2000 ml  Net -876.55 ml       06/15/2022    9:19 AM 06/09/2022    3:09 PM 06/03/2022   11:46 AM  Last 3 Weights  Weight (lbs) 209 lb 209 lb 212 lb  Weight (kg)  94.802 kg 94.802 kg 96.163 kg      Telemetry    A-sensed with v-pacing with frequent PVCs - Personally Reviewed  ECG    No new tracing today - Personally Reviewed  Physical Exam   GEN: No acute distress.   Neck: No JVD Cardiac: RRR, 2/6 systolic murmur Respiratory: CTAB GI: Soft, nontender, non-distended  MS: Left arm ecchymosis but hand warm and left radial pulse is palpable Neuro:  Nonfocal  Psych: Normal affect   Labs    High Sensitivity Troponin:   Recent Labs  Lab 05/23/22 1126 05/23/22 1351 05/24/22 0826 05/24/22 1016  TROPONINIHS 185* 178* 88* 72*      Chemistry Recent Labs  Lab 06/15/22 0830 06/15/22 0835 06/15/22 2242 06/16/22 0130 06/17/22 0843 06/18/22 0829  NA 134*   < > 131* 131* 131* 133*  K 3.3*   < > 4.2 4.3 4.1 3.4*  CL 106   < > 101 103 103 102  CO2 14*  --  19* 18* 16* 21*  GLUCOSE 165*   < > 190* 184* 159* 103*  BUN 14   < > 18 19 24* 19  CREATININE 1.02   < > 1.08 1.21 1.09 1.00  CALCIUM 7.7*  --  8.3* 8.2* 8.4* 8.3*  MG  --   --   --   --   --  2.4  PROT 5.9*  --  6.3*  --   --   --   ALBUMIN 2.3*  --  2.6*  --   --   --   AST 26  --  25  --   --   --   ALT 12  --  14  --   --   --   ALKPHOS 73  --  74  --   --   --   BILITOT 2.0*  --  1.7*  --   --   --   GFRNONAA >60  --  >60 >60 >60 >60  ANIONGAP 14  --  11 10 12 10    < > = values in this interval not displayed.     Lipids No results for input(s): "CHOL", "TRIG", "HDL", "LABVLDL", "LDLCALC", "CHOLHDL" in the last 168 hours.  Hematology Recent Labs  Lab 06/15/22 2242 06/16/22 1309 06/18/22 0829  WBC 14.0* 12.8* 17.3*  RBC 4.01* 3.91* 4.17*  HGB 10.1* 9.7* 10.7*  HCT 32.0* 31.9* 34.8*  MCV 79.8* 81.6 83.5  MCH 25.2* 24.8* 25.7*  MCHC 31.6 30.4 30.7  RDW 18.6* 18.5* 20.4*  PLT 84* 58* 88*    Thyroid No results for input(s): "TSH", "FREET4" in the last 168 hours.  BNPNo results for input(s): "BNP", "PROBNP" in the last 168 hours.  DDimer No results for input(s):  "DDIMER" in the last 168 hours.   Radiology    MR BRAIN WO CONTRAST  Result Date: 06/16/2022 CLINICAL DATA:  Neuro deficit, acute, stroke suspected. Slurred speech. EXAM: MRI HEAD WITHOUT CONTRAST TECHNIQUE: Multiplanar, multiecho pulse sequences of the brain and surrounding structures were obtained without intravenous contrast. COMPARISON:  Head CT and CTA 06/15/2022 FINDINGS: Brain: There are punctate foci of mildly restricted diffusion involving white matter of the posterior left temporal lobe, left occipital lobe, and posterior right frontal lobe as well as cortex in the right postcentral gyrus. A few small chronic bilateral cerebral microhemorrhages are noted. Small T2 hyperintensities in the cerebral white matter bilaterally are nonspecific but compatible with minimal chronic small vessel ischemic disease. No mass, midline shift, or extra-axial fluid collection is evident. There is mild cerebral and cerebellar atrophy. There may be a tiny chronic right cerebellar infarct. Vascular: Major intracranial vascular flow voids are preserved. Skull and upper cervical spine: Unremarkable bone marrow signal. Sinuses/Orbits: Unremarkable orbits. Paranasal sinuses and mastoid air cells are clear. Other: None. IMPRESSION: Scattered punctate acute to subacute bilateral cerebral infarcts primarily involving white matter. Electronically Signed   By: Sebastian Ache M.D.   On: 06/16/2022 16:22    Cardiac Studies   TEE 05/08 prelim report: KEY FINDINGS:   Left ventricle: normal function. Limited visualization of the LV apex, however, no obvious thrombus noted.  Right ventricle: Moderate to severely dilated with moderately reduced function.  Mitral valve: 27mm Medtronic bioprosthetic valve in the mitral position. There is a large vegetation encompassing a majority of the valve leaflets. There is mild mitral stenosis. The valve is otherwise well seated with no regurgitation.  Aortic valve: 23mm Edwards bovine valve  present (10/11/18). The aortic valve leaflets appear thickened with a small subcentimeter vegetation seen on limited views at the aortic aspect of the valve. The valve is otherwise well seated with no paravalvular or valvular regurgitation. No signs of aortic root abscess or pathology at the aortomitral curtain. ECHO: - 05/23/22: Normal LV function, severely dilated RV with moderately reduced function, moderate stenosis of prosthesis (gradient ), severe TR  due to poor coaptation of leaflets, s/p AVR as per my personal interpretation - 06/13/19: Normal LV function with septal bowing to the left; dilated RV with mildly reduced function. Mitral bioprosthesis gradient of - 06/27/18: Normal LV function, normal RV function. Severe MS/AS with moderate AI.    CATH: - 05/25/22:   Prox LAD lesion is 40% stenosed.   Mid LAD lesion is 95% stenosed.   Prox Cx to Mid Cx lesion is 30% stenosed.   1st Mrg lesion is 30% stenosed.   3rd Mrg lesion is 30% stenosed.   Prox RCA lesion is 100% stenosed.   SVG graft was visualized by angiography and is normal in caliber.   LIMA graft was visualized by angiography and is normal in caliber.   Severe mid LAD stenosis. Competitive filling is seen distally from the patent LIMA graft.  Patent LIMA graft to the mid LAD.  Mild non-obstructive disease in the Circumflex artery.  Large dominant RCA with chronic proximal occlusion. The mid and distal RCA fills from the patent vein graft.  Near normal right and left heart pressures.  CO 6.92 L/min CI 3.33     Diagnostic Dominance: Right  Intervention   - 09/15/2018: Prox RCA lesion is 100% stenosed. Prox Cx to Mid Cx lesion is 30% stenosed. 1st Mrg lesion is 30% stenosed. 3rd Mrg lesion is 30% stenosed. Prox LAD lesion is 40% stenosed. Mid LAD lesion is 95% stenosed.   1. Severe stenosis mid LAD 2. Mild non-obstructive disease in the LAD 3. Chronic occlusion of the proximal RCA. Filling of the proximal, mid  and distal RCA from left to right collaterals 4. Severe aortic stenosis (mean gradient 34.1 mmHg, peak to peak gradient 32 mmHg, AVA 1.1 cm2) 5. Severe mitral stenosis by echo.     Patient Profile     72 y.o. male with a hx of CAD with CABG X 2 vessel and L atrial clipping, + foramen ovale closure, s/p aortic and MVR bioprosthetic and PPM, atrial flutter, tobacco abuse, HLD, severe TR with RV dysfunction who presented to the ER with a cold left arm with lack of a pulse found to have acute occlusion of the high left brachial artery now s/p thromboembolectomy. Cardiology was initially consulted for further evaluation for possible cardiac source of embolus. TEE 5/8 with large mitral valve vegetation and subcentimeter AoV vegetation with prelim blood cultures positive for enterococcus concerning for IE.   Assessment & Plan    #MV/AoV Bioprosthetic Endocarditis: -Patient presented with a cold left arm with lack of radial pulse found to have acute occlusion of the left brachial artery now s/p thromboembolectomy with vascular surgery -TEE obtained for cardiac source of embolus and revealed large bioprosthetic mitral valve vegetation and subcentimeter AoV vegetation concerning for endocarditis -Blood cultures positive for enterococcus  -ID consulted and currently on amp, CTX -CT surgery deemed patient too high risk for surgery and he is recommended for continued medical management -Fortunately, there is no significant MR/AR at this time   #RV Failure: #Severe TR: #Chronic Diastolic HF: -Patient with mainly right sided failure followed by Dr. Gasper Lloyd as outpatient -Suspect secondary to chronic RV pacing and severe TR -PCWP and mPAP not significantly elevated and MS only mild on TEE making this unlikely to be contributing to degree of RV dysfunction -Currently compensated on exam -Continue lasix 40mg  daily -Continue jardiance 10mg  daily -Continue dig 0.125mg  daily  #Paroxysmal Afib: -Suspect  acute arterial thrombosis was secondary to MV/AV endocarditis rather than  apixaban failure -Continue heparin gtt for now for [facility] -Given only mild MS on TEE, likely can resume home apixaban long-term -Continue home metop 37.5mg  BID  #CAD s/p CABG: -Continue lipitor 20mg  daily -Not on ASA due to need for AC -Continue metop 37.5mg  BID as above  #CHB s/p PPM: -No evidence of device lead vegetations on TEE -Initial blood cultures with enterococcus and fortunately not staph/strep -On ABX  No changes from CV perspective. Will follow peripherally over the weekend. Please call with questions or concerns.       For questions or updates, please contact [facility] Please consult [email or web address] for contact info under        Signed, Meriam Sprague, MD  06/18/2022, 12:11 PM

## 2022-06-18 NOTE — Progress Notes (Addendum)
[facility]  Date of Admission:  06/15/2022   Total days of inpatient antibiotics 4  Principal Problem:   Ischemia of left upper extremity Active Problems:   H/O aortic valve replacement   History of mitral valve replacement   Brachial artery occlusion, left (HCC)   Paroxysmal A-fib (HCC)   Endocarditis   Bacteremia due to Enterococcus   Acute CVA (cerebrovascular accident) [facility])          Assessment: 72 year old male with bioprosthetic AVR and by bioprosthetic MVR admitted with acute thrombus to left lower extremity requiring urgent surgery found to have E faecalis bacteremia:     #E faecalis bacteremia with 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  He had a crown done on Monday.  He reports that he noticed when he woke up on Tuesday his left arm felt swollen almost like he slept on it. - Patient underwent left upper extremity thrombectomy via brachial radial artery and angiogram on 5/7 with Dr. Randie Heinz, vascular surgery. Vascular surgery following - Cardiology engaged for TEE which showed large vegetation encompassing majority of mitral valve leaflet, aortic bovine valve with small subcentimeter vegetation.  - Given a faecalis and blood cultures and emboli to the left arm this is consistent with endocarditis.  Patient has not been feeling well since February his clinical picture is consistent with subacute endocarditis which is seen with a faecalis.  Of note, patient developed left arm emboli after chronic, I wonder if that was the inciting event to worsen his clinical course. -Seen by CTS Dr. Cliffton Asters, noted that given all his comorbidities the likelihood of  subsequent surgical transfer limited.  Recommended medical management and antibiotic therapy at this point. Recommendations:  -Continue ampicillin and ceftriaxone for IE coverage - Repeat blood cultures today, if blood cultures remain negative x 72 hours then can get PICC line. - TEE read pending - Colonoscopy can be done outpatient given E faecalis. Last colonoscopy on 03/20/08, noted 2 polyps found    Dr. Drue Second will be covering this weekend. Microbiology:   Antibiotics: Cefazolin 5/7 Vanc 5/08 CTX 5/08- AMP 5/9-   Cultures: Blood 5/8 2/2 E faecalis   SUBJECTIVE: Sitting in chair, no new complaints Interval: Afebrile overnight, wbc 17.3k  Review of Systems: Review of Systems  All other systems reviewed and are negative.    Scheduled Meds:  atorvastatin  20 mg Oral Daily   digoxin  0.125 mg Oral Daily   docusate sodium  100 mg Oral BID   empagliflozin  10 mg Oral QAC breakfast   [START ON 06/19/2022] furosemide  40 mg Oral Daily   metoprolol tartrate  37.5 mg Oral BID   pantoprazole  40 mg Oral Daily   sodium chloride flush  3 mL Intravenous Once   sodium chloride flush  3 mL Intravenous Q12H   Continuous Infusions:  sodium chloride     sodium chloride Stopped (06/16/22 1826)   ampicillin (OMNIPEN) IV 2 g (06/18/22 0815)   cefTRIAXone (ROCEPHIN)  IV 2 g (06/18/22 0944)   heparin 700 Units/hr (06/17/22 1015)   PRN Meds:.sodium chloride, sodium chloride, acetaminophen **OR** acetaminophen, alum & mag hydroxide-simeth, bisacodyl, guaiFENesin-dextromethorphan, metoprolol tartrate, morphine injection, ondansetron, phenol, senna-docusate, sodium  chloride flush, traMADol No Known Allergies  OBJECTIVE: Vitals:   06/17/22 2121 06/18/22 0002 06/18/22 0327 06/18/22 0756  BP: (!) 141/84 134/70 (!) 159/91 (!) 149/76  Pulse: 95 89 94 99  Resp: 20 20 16 19   Temp: (!) 97.5 F (36.4 C) 98.1 F (36.7 C) 97.6 F (36.4 C) 97.8 F (36.6 C)  TempSrc: Oral Oral Oral Oral   SpO2: 94% 95% 98% 96%  Weight:      Height:       Body mass index is 32.73 kg/m.  Physical Exam Constitutional:      General: He is not in acute distress.    Appearance: He is normal weight. He is not toxic-appearing.  HENT:     Head: Normocephalic and atraumatic.     Right Ear: External ear normal.     Left Ear: External ear normal.     Nose: No congestion or rhinorrhea.     Mouth/Throat:     Mouth: Mucous membranes are moist.     Pharynx: Oropharynx is clear.  Eyes:     Extraocular Movements: Extraocular movements intact.     Conjunctiva/sclera: Conjunctivae normal.     Pupils: Pupils are equal, round, and reactive to light.  Cardiovascular:     Rate and Rhythm: Normal rate and regular rhythm.     Heart sounds: No murmur heard.    No friction rub. No gallop.  Pulmonary:     Effort: Pulmonary effort is normal.     Breath sounds: Normal breath sounds.  Abdominal:     General: Abdomen is flat. Bowel sounds are normal.     Palpations: Abdomen is soft.  Musculoskeletal:        General: No swelling.     Cervical back: Normal range of motion and neck supple.     Comments: Left fore-arm eythema  Skin:    General: Skin is warm and dry.  Neurological:     General: No focal deficit present.     Mental Status: He is oriented to person, place, and time.  Psychiatric:        Mood and Affect: Mood normal.       Lab Results Lab Results  Component Value Date   WBC 17.3 (H) 06/18/2022   HGB 10.7 (L) 06/18/2022   HCT 34.8 (L) 06/18/2022   MCV 83.5 06/18/2022   PLT 88 (L) 06/18/2022    Lab Results  Component Value Date   CREATININE 1.00 06/18/2022   BUN 19 06/18/2022   NA 133 (L) 06/18/2022   K 3.4 (L) 06/18/2022   CL 102 06/18/2022   CO2 21 (L) 06/18/2022    Lab Results  Component Value Date   ALT 14 06/15/2022   AST 25 06/15/2022   ALKPHOS 74 06/15/2022   BILITOT 1.7 (H) 06/15/2022        Danelle Earthly, MD [facility] 06/18/2022, 10:17 AM   I have personally spent 54 minutes involved in face-to-face and non-face-to-face activities for this patient on the day of the visit. Professional time spent includes the following activities: Preparing to see the patient (review of tests), Obtaining and/or reviewing separately obtained history (admission/discharge record), Performing a medically appropriate examination and/or evaluation , Ordering medications/tests/procedures, referring and communicating with other health care professionals, Documenting clinical information in the EMR, Independently interpreting results (not separately reported), Communicating results to the patient/family/caregiver, Counseling and educating the patient/family/caregiver and Care coordination (not separately reported).

## 2022-06-18 NOTE — Progress Notes (Signed)
Progress Note    JENNIFER QUINTOS   ZOX:[id number]  DOB: 02-16-1950  DOA: 06/15/2022     3 PCP: [name], MD  Initial CC: left arm pain  Hospital Course: Mr. Gatson is a 72 yo male with PMH atrial fib, PAD, CAD s/p CABG, CHB s/p PPM, obesity, AV replacement (bioprosthetic), MV replacement (bioprosthetic), HLD, prostate cancer who initially presented to the hospital on 06/15/2022 with left hand numbness and pain. He was admitted and underwent evaluation with vascular surgery.  He was found to have left upper extremity ischemia with CTA evidence of acute occlusion of the high left proximal brachial artery at the level of the left humeral neck. He then underwent left upper extremity thromboembolectomy via brachial and radial artery approach on 06/15/2022.  He recently had cardiac workup on 05/23/2022 after presenting with shortness of breath and found to have elevated troponin and BNP.  He underwent cardiac cath on 05/25/2022 which showed severe mid LAD stenosis.  It was also noted that left radial artery was unable to be accessed.  His symptoms were felt to be due to severe TR and he was referred to outpatient advanced heart failure clinic.  He was also changed from Coumadin to Eliquis.  Due to left brachial artery occlusion, cardiology was consulted to evaluate for possible cardiac source of embolus. He underwent TEE on 06/16/2022 which revealed a large vegetation involving mitral valve and small subcentimeter vegetation involving aortic valve.  Blood cultures on admission then became positive with E faecalis in 2/3 bottles.  He had initially been started on antibiotics on admission which were transitioned to ampicillin and Rocephin after blood cultures resulted.   Interval History:  No events overnight.  Reviewed discussions from multiple specialists yesterday with patient.  Friend also present bedside this morning. He states he was peeing all night after the afternoon dose of Lasix yesterday and  asked if regimen can be adjusted.  Assessment and Plan:  Endocarditis E. Faecalis bacteremia - underwent cardiology eval for cardiac embolic source for LUE arterial thrombus - s/p TEE on 06/16/22 positive for AV and MV vegetations - ID also following - continue rocephin and amp as per ID - will consult CTS per ID rec's as well  Left brachial artery occlusion - Patient presented with left arm pain and heaviness as per HPI.  Workup has been above showed acute occlusion of his left proximal brachial artery (now s/p thrombectomy on 06/15/2022) - vascular following - continue heparin drip  Acute CVA -Scattered punctate acute to subacute bilateral cerebral infarcts primarily involving white matter noted on MRI brain performed 06/16/2022 -Suspect cardiac embolic source given clinical workup thus far (suspected septic emboli) - neurology aware  History of MV and AV replacement (bioprosthetic's), 10/2018 with Dr. Cornelius Moras CABG x 4 Clipping of LAE Closure of PFO - all took place on 10/11/2018 with Dr. Cornelius Moras  RV failure Chronic dCHF Severe TR - cardiology following - recently started on dig and jardiance as well - recently referred to AHF outpatient as well - remains compensated    Atrial fibrillation - Recent change from warfarin to Eliquis.  Has been taking Eliquis regularly - continue heparin drip  - monitor Hgb and PLTC while on heparin    Hyponatremia - mild - trend for now   CAD - Status post CABG x 4, see above - Continue home atorvastatin, metoprolol - On heparin as above   Complete heart block - Status post pacemaker.  Paced rhythm observed thus far. -  continue tele   Hyperlipidemia - Continue home atorvastatin   History of prostate cancer - Noted   Obesity - Noted   Old records reviewed in assessment of this patient  Antimicrobials: Vancomycin 06/16/2022 x 1 Ancef 5/7 >> 5/8 Cefuroxime 5/7 x 1 Ampicillin 5/9 >> current Rocephin 5/8 >> current   DVT  prophylaxis:  SCDs Start: 06/15/22 2009   Code Status:   Code Status: Full Code  Mobility Assessment (last 72 hours)     Mobility Assessment     Row Name 06/18/22 1047 06/18/22 1004 06/18/22 0900 06/18/22 0800 06/17/22 1410   Does patient have an order for bedrest or is patient medically unstable No - Continue assessment No - Continue assessment -- No - Continue assessment No - Continue assessment   What is the highest level of mobility based on the progressive mobility assessment? Level 5 (Walks with assist in room/hall) - Balance while stepping forward/back and can walk in room with assist - Complete Level 5 (Walks with assist in room/hall) - Balance while stepping forward/back and can walk in room with assist - Complete Level 5 (Walks with assist in room/hall) - Balance while stepping forward/back and can walk in room with assist - Complete -- Level 5 (Walks with assist in room/hall) - Balance while stepping forward/back and can walk in room with assist - Complete   Is the above level different from baseline mobility prior to current illness? Yes - Recommend PT order Yes - Recommend PT order -- Yes - Recommend PT order Yes - Recommend PT order    Row Name 06/17/22 0830 06/16/22 0439 06/16/22 0005 06/15/22 2030     Does patient have an order for bedrest or is patient medically unstable No - Continue assessment No - Continue assessment No - Continue assessment No - Continue assessment    What is the highest level of mobility based on the progressive mobility assessment? Level 5 (Walks with assist in room/hall) - Balance while stepping forward/back and can walk in room with assist - Complete Level 5 (Walks with assist in room/hall) - Balance while stepping forward/back and can walk in room with assist - Complete Level 5 (Walks with assist in room/hall) - Balance while stepping forward/back and can walk in room with assist - Complete Level 5 (Walks with assist in room/hall) - Balance while stepping  forward/back and can walk in room with assist - Complete    Is the above level different from baseline mobility prior to current illness? Yes - Recommend PT order -- -- --             Barriers to discharge: none Disposition Plan:  TBD Status is: Inpt  Objective: Blood pressure 134/82, pulse 95, temperature 98.6 F (37 C), temperature source Oral, resp. rate 19, height 5\' 7"  (1.702 m), weight 94.8 kg, SpO2 96 %.  Examination:  Physical Exam Constitutional:      Appearance: Normal appearance.  HENT:     Head: Normocephalic and atraumatic.     Mouth/Throat:     Mouth: Mucous membranes are moist.  Eyes:     Extraocular Movements: Extraocular movements intact.  Cardiovascular:     Rate and Rhythm: Normal rate and regular rhythm.  Pulmonary:     Effort: Pulmonary effort is normal.     Breath sounds: Normal breath sounds.  Abdominal:     General: Bowel sounds are normal. There is no distension.     Palpations: Abdomen is soft.     Tenderness:  There is no abdominal tenderness.  Musculoskeletal:     Cervical back: Normal range of motion and neck supple.     Comments: LUE noted with sutures in place and surrounding ecchymoses; mild edema  Skin:    General: Skin is warm and dry.  Neurological:     General: No focal deficit present.     Mental Status: He is alert.  Psychiatric:        Mood and Affect: Mood normal.        Behavior: Behavior normal.      Consultants:  ID Vascular surgery Cardiothoracic surgery Neurology Cardiology  Procedures:  06/15/2022: Left upper extremity thromboembolectomy via brachial and radial artery approach 06/15/2022: Left upper extremity angiogram of left subclavian and axillary arteries 06/16/2022: TEE  Data Reviewed: Results for orders placed or performed during the hospital encounter of 06/15/22 (from the past 24 hour(s))  Heparin level (unfractionated)     Status: None   Collection Time: 06/17/22  9:08 PM  Result Value Ref Range    Heparin Unfractionated 0.47 0.30 - 0.70 IU/mL  Heparin level (unfractionated)     Status: None   Collection Time: 06/18/22  8:29 AM  Result Value Ref Range   Heparin Unfractionated 0.31 0.30 - 0.70 IU/mL  Basic metabolic panel     Status: Abnormal   Collection Time: 06/18/22  8:29 AM  Result Value Ref Range   Sodium 133 (L) 135 - 145 mmol/L   Potassium 3.4 (L) 3.5 - 5.1 mmol/L   Chloride 102 98 - 111 mmol/L   CO2 21 (L) 22 - 32 mmol/L   Glucose, Bld 103 (H) 70 - 99 mg/dL   BUN 19 8 - 23 mg/dL   Creatinine, Ser 1.61 0.61 - 1.24 mg/dL   Calcium 8.3 (L) 8.9 - 10.3 mg/dL   GFR, Estimated >09 >60 mL/min   Anion gap 10 5 - 15  CBC with Differential/Platelet     Status: Abnormal   Collection Time: 06/18/22  8:29 AM  Result Value Ref Range   WBC 17.3 (H) 4.0 - 10.5 K/uL   RBC 4.17 (L) 4.22 - 5.81 MIL/uL   Hemoglobin 10.7 (L) 13.0 - 17.0 g/dL   HCT 45.4 (L) 09.8 - 11.9 %   MCV 83.5 80.0 - 100.0 fL   MCH 25.7 (L) 26.0 - 34.0 pg   MCHC 30.7 30.0 - 36.0 g/dL   RDW 14.7 (H) 82.9 - 56.2 %   Platelets 88 (L) 150 - 400 K/uL   nRBC 1.5 (H) 0.0 - 0.2 %   Neutrophils Relative % 82 %   Neutro Abs 14.2 (H) 1.7 - 7.7 K/uL   Lymphocytes Relative 7 %   Lymphs Abs 1.2 0.7 - 4.0 K/uL   Monocytes Relative 5 %   Monocytes Absolute 0.9 0.1 - 1.0 K/uL   Eosinophils Relative 0 %   Eosinophils Absolute 0.1 0.0 - 0.5 K/uL   Basophils Relative 1 %   Basophils Absolute 0.1 0.0 - 0.1 K/uL   Immature Granulocytes 5 %   Abs Immature Granulocytes 0.94 (H) 0.00 - 0.07 K/uL  Magnesium     Status: None   Collection Time: 06/18/22  8:29 AM  Result Value Ref Range   Magnesium 2.4 1.7 - 2.4 mg/dL    I have reviewed pertinent nursing notes, vitals, labs, and images as necessary. I have ordered labwork to follow up on as indicated.  I have reviewed the last notes from staff over past 24 hours. I have  discussed patient's care plan and test results with nursing staff, CM/SW, and other staff as appropriate.  Time  spent: Greater than 50% of the 55 minute visit was spent in counseling/coordination of care for the patient as laid out in the A&P.   LOS: 3 days   Lewie Chamber, MD [facility] 06/18/2022, 3:14 PM

## 2022-06-18 NOTE — Progress Notes (Addendum)
  Progress Note    06/18/2022 7:24 AM 2 Days Post-Op  Subjective:  L arm continues to feel better   Vitals:   06/18/22 0002 06/18/22 0327  BP: 134/70 (!) 159/91  Pulse: 89 94  Resp: 20 16  Temp: 98.1 F (36.7 C) 97.6 F (36.4 C)  SpO2: 95% 98%   Physical Exam: Lungs:  non labored Incisions:  L arm incision c/d/I; ecchymosis but no firm hematoma Extremities:  palpable 1+ L radial Neurologic: A&O  CBC    Component Value Date/Time   WBC 12.8 (H) 06/16/2022 1309   RBC 3.91 (L) 06/16/2022 1309   HGB 9.7 (L) 06/16/2022 1309   HGB 11.8 (L) 05/05/2022 1230   HCT 31.9 (L) 06/16/2022 1309   HCT 37.2 (L) 05/05/2022 1230   PLT 58 (L) 06/16/2022 1309   PLT 210 05/05/2022 1230   MCV 81.6 06/16/2022 1309   MCV 82 05/05/2022 1230   MCH 24.8 (L) 06/16/2022 1309   MCHC 30.4 06/16/2022 1309   RDW 18.5 (H) 06/16/2022 1309   RDW 14.5 05/05/2022 1230   LYMPHSABS 0.5 (L) 06/15/2022 0845   LYMPHSABS 0.8 05/05/2022 1230   MONOABS 1.0 06/15/2022 0845   EOSABS 0.0 06/15/2022 0845   EOSABS 0.0 05/05/2022 1230   BASOSABS 0.1 06/15/2022 0845   BASOSABS 0.0 05/05/2022 1230    BMET    Component Value Date/Time   NA 131 (L) 06/17/2022 0843   NA 137 05/05/2022 1230   K 4.1 06/17/2022 0843   CL 103 06/17/2022 0843   CO2 16 (L) 06/17/2022 0843   GLUCOSE 159 (H) 06/17/2022 0843   BUN 24 (H) 06/17/2022 0843   BUN 13 05/05/2022 1230   CREATININE 1.09 06/17/2022 0843   CREATININE 0.92 12/08/2015 0848   CALCIUM 8.4 (L) 06/17/2022 0843   GFRNONAA >60 06/17/2022 0843   GFRAA >60 04/02/2019 0901    INR    Component Value Date/Time   INR 1.6 (H) 06/15/2022 2242     Intake/Output Summary (Last 24 hours) at 06/18/2022 0724 Last data filed at 06/18/2022 2952 Gross per 24 hour  Intake 1303.45 ml  Output 2001 ml  Net -697.55 ml     Assessment/Plan:  72 y.o. male is s/p L arm thrombectomy 2 Days Post-Op   L hand well perfused with palpable radial pulse No change in hematoma since  increasing heparin rate, maybe even softer today Will need DOAC at discharge; office will arrange follow up in 2-3 weeks  Emilie Rutter, PA-C [facility] [phone] 06/18/2022 7:24 AM   I have independently interviewed and examined patient and agree with PA assessment and plan above.   Ryliee Figge C. Randie Heinz, MD [facility] [place] Office: [phone] Pager: [phone]

## 2022-06-18 NOTE — Progress Notes (Signed)
ANTICOAGULATION CONSULT NOTE  Pharmacy Consult for heparin Indication: Left brachial artery occlusion and CVA  No Known Allergies  Patient Measurements: Height: 5\' 7"  (170.2 cm) Weight: 94.8 kg (209 lb) IBW/kg (Calculated) : 66.1 HEPARIN DW (KG): 86.3   Vital Signs: Temp: 97.8 F (36.6 C) (05/10 0756) Temp Source: Oral (05/10 0756) BP: 149/76 (05/10 0756) Pulse Rate: 99 (05/10 0756)  Labs: Recent Labs    06/15/22 2242 06/16/22 0130 06/16/22 1309 06/17/22 0155 06/17/22 0843 06/17/22 2108 06/18/22 0829  HGB 10.1*  --  9.7*  --   --   --  10.7*  HCT 32.0*  --  31.9*  --   --   --  34.8*  PLT 84*  --  58*  --   --   --  88*  APTT  --  39*  --  33  --   --   --   LABPROT 19.2*  --   --   --   --   --   --   INR 1.6*  --   --   --   --   --   --   HEPARINUNFRC  --   --   --   --   --  0.47 0.31  CREATININE 1.08 1.21  --   --  1.09  --  1.00     Estimated Creatinine Clearance: 74.4 mL/min (by C-G formula based on SCr of 1 mg/dL).   Medical History: Past Medical History:  Diagnosis Date   BCE (basal cell epithelioma), arm    RIGHT SHOULDER   Bradycardia    Bumps on skin    on left side of nose for last 3 weeks   CAD (coronary artery disease)    a. s/p CABGx2 10/11/18 LIMA to LAD, SVG to RCA, EVH via right thigh.   Dyslipidemia    Ectatic thoracic aorta (HCC)    Esophageal stricture    GERD (gastroesophageal reflux disease)    Heart murmur    sees dr Rose Fillers   Hyperlipidemia    Incidental pulmonary nodule, > 3mm and < 8mm 10/04/2018   Noted on CTA   Inguinal hernia    bilateral   Obesity    Presence of permanent cardiac pacemaker    Prostate cancer (HCC)    S/P aortic valve replacement with bioprosthetic valve 10/11/2018   23 mm Edwards Inspiris Resilia stented bovine pericardial tissue valve, miltral valve done also   S/P CABG x 2 10/11/2018   LIMA to LAD, SVG to RCA, EVH via right thigh   S/P mitral valve replacement with bioprosthetic valve 10/11/2018    27 mm Medtronic Mosaic stented porcine bioprosthetic tissue valve   S/P patent foramen ovale closure 10/11/2018   S/P placement of cardiac pacemaker    Smoker     Medications:  Medications Prior to Admission  Medication Sig Dispense Refill Last Dose   acetaminophen (TYLENOL) 325 MG tablet Take 650 mg by mouth as needed for moderate pain.   Unk   apixaban (ELIQUIS) 5 MG TABS tablet Take 1 tablet (5 mg total) by mouth 2 (two) times daily. 60 tablet 1 06/14/2022 at 1900   atorvastatin (LIPITOR) 20 MG tablet Take 1 tablet (20 mg total) by mouth daily. 90 tablet 3 06/14/2022   digoxin (LANOXIN) 0.125 MG tablet Take 1 tablet (0.125 mg total) by mouth daily. 90 tablet 3 06/14/2022   empagliflozin (JARDIANCE) 10 MG TABS tablet Take 1 tablet (10 mg total) by mouth  daily before breakfast. 30 tablet 11 06/14/2022   furosemide (LASIX) 20 MG tablet Take 20 mg by mouth 2 (two) times daily.   06/14/2022   Iron, Ferrous Sulfate, 325 (65 Fe) MG TABS Take 1 tablet by mouth 2 (two) times daily. (Patient taking differently: Take 1 tablet by mouth daily.) 60 tablet 1 06/14/2022   metoprolol tartrate (LOPRESSOR) 25 MG tablet Take 1.5 tablets (37.5 mg total) by mouth 2 (two) times daily. 180 tablet 3 06/14/2022 at 1900   oxymetazoline (AFRIN) 0.05 % nasal spray Place 1 spray into both nostrils at bedtime as needed for congestion.      polyethylene glycol powder (GLYCOLAX/MIRALAX) 17 GM/SCOOP powder Take 17 g by mouth daily as needed for mild constipation. 238 g 0 Unk   Scheduled:   atorvastatin  20 mg Oral Daily   digoxin  0.125 mg Oral Daily   docusate sodium  100 mg Oral BID   empagliflozin  10 mg Oral QAC breakfast   [START ON 06/19/2022] furosemide  40 mg Oral Daily   metoprolol tartrate  37.5 mg Oral BID   pantoprazole  40 mg Oral Daily   sodium chloride flush  3 mL Intravenous Once   sodium chloride flush  3 mL Intravenous Q12H    Assessment: 72 yo male with left brachial artery occlusion s/p thrombectomy.  Initially started on fixed dose heparin at 500 units/hr post-op per Vascular Surgery. He is noted with history of afib and was on apixaban PTA. Pharmacy consulted to dose heparin - dose ok to be titrated to heparin level goal 0.3-0.5 per MD on 5/9. MRI noted with cerebral infarcts - dosing with no bolus per stroke protocol.  -heparin level 0.31 on 700 units/hr -hg= 10.7, plt 58>88  Goal of Therapy:  Heparin level 0.3-0.5 units/ml Monitor platelets by anticoagulation protocol: Yes   Plan:  Continue heparin at 700 units/hr Daily heparin level and CBC Will follow oral anticoagulation plans  Harland German, PharmD Clinical Pharmacist **Pharmacist phone directory can now be found on [email or web address] ([email or web address]).  Listed under [facility].

## 2022-06-19 DIAGNOSIS — I70208 Unspecified atherosclerosis of native arteries of extremities, other extremity: Secondary | ICD-10-CM | POA: Diagnosis not present

## 2022-06-19 DIAGNOSIS — R7881 Bacteremia: Secondary | ICD-10-CM | POA: Diagnosis not present

## 2022-06-19 DIAGNOSIS — I998 Other disorder of circulatory system: Secondary | ICD-10-CM | POA: Diagnosis not present

## 2022-06-19 DIAGNOSIS — I330 Acute and subacute infective endocarditis: Secondary | ICD-10-CM | POA: Diagnosis not present

## 2022-06-19 LAB — BASIC METABOLIC PANEL
Anion gap: 10 (ref 5–15)
BUN: 16 mg/dL (ref 8–23)
CO2: 22 mmol/L (ref 22–32)
Calcium: 7.9 mg/dL — ABNORMAL LOW (ref 8.9–10.3)
Chloride: 100 mmol/L (ref 98–111)
Creatinine, Ser: 1.05 mg/dL (ref 0.61–1.24)
GFR, Estimated: >60 mL/min (ref >60)
Glucose, Bld: 108 mg/dL — ABNORMAL HIGH (ref 70–99)
Potassium: 3.1 mmol/L — ABNORMAL LOW (ref 3.5–5.1)
Sodium: 132 mmol/L — ABNORMAL LOW (ref 135–145)

## 2022-06-19 LAB — CULTURE, BLOOD (ROUTINE X 2)

## 2022-06-19 LAB — CBC WITH DIFFERENTIAL/PLATELET
Abs Immature Granulocytes: 0.67 10*3/uL — ABNORMAL HIGH (ref 0.00–0.07)
Basophils Absolute: 0.1 10*3/uL (ref 0.0–0.1)
Basophils Relative: 0 %
Eosinophils Absolute: 0.1 10*3/uL (ref 0.0–0.5)
Eosinophils Relative: 1 %
HCT: 32.6 % — ABNORMAL LOW (ref 39.0–52.0)
Hemoglobin: 9.8 g/dL — ABNORMAL LOW (ref 13.0–17.0)
Immature Granulocytes: 5 %
Lymphocytes Relative: 7 %
Lymphs Abs: 1 10*3/uL (ref 0.7–4.0)
MCH: 25.4 pg — ABNORMAL LOW (ref 26.0–34.0)
MCHC: 30.1 g/dL (ref 30.0–36.0)
MCV: 84.5 fL (ref 80.0–100.0)
Monocytes Absolute: 0.6 10*3/uL (ref 0.1–1.0)
Monocytes Relative: 4 %
Neutro Abs: 11.2 10*3/uL — ABNORMAL HIGH (ref 1.7–7.7)
Neutrophils Relative %: 83 %
Platelets: 65 10*3/uL — ABNORMAL LOW (ref 150–400)
RBC: 3.86 MIL/uL — ABNORMAL LOW (ref 4.22–5.81)
RDW: 21 % — ABNORMAL HIGH (ref 11.5–15.5)
WBC: 13.6 10*3/uL — ABNORMAL HIGH (ref 4.0–10.5)
nRBC: 0.9 % — ABNORMAL HIGH (ref 0.0–0.2)

## 2022-06-19 LAB — HEPARIN LEVEL (UNFRACTIONATED)
Heparin Unfractionated: 0.15 IU/mL — ABNORMAL LOW (ref 0.30–0.70)
Heparin Unfractionated: 0.12 IU/mL — ABNORMAL LOW (ref 0.30–0.70)

## 2022-06-19 LAB — MAGNESIUM: Magnesium: 2.2 mg/dL (ref 1.7–2.4)

## 2022-06-19 MED ORDER — POTASSIUM CHLORIDE CRYS ER 20 MEQ PO TBCR
40.0000 meq | EXTENDED_RELEASE_TABLET | ORAL | Status: AC
  Administered 2022-06-19 (×2): 40 meq via ORAL
  Filled 2022-06-19 (×2): qty 2

## 2022-06-19 NOTE — Progress Notes (Signed)
CCMD notified patient had 4 beat of non sustain V tach,pt remains asymptomatic,vitals checked,MD notified

## 2022-06-19 NOTE — Progress Notes (Signed)
Progress Note    [name]   WUJ:[id number]  DOB: 1950/08/23  DOA: 06/15/2022     4 PCP: [name], MD  Initial CC: left arm pain  Hospital Course: Mr. Luchsinger is a 72 yo male with PMH atrial fib, PAD, CAD s/p CABG, CHB s/p PPM, obesity, AV replacement (bioprosthetic), MV replacement (bioprosthetic), HLD, prostate cancer who initially presented to the hospital on 06/15/2022 with left hand numbness and pain. He was admitted and underwent evaluation with vascular surgery.  He was found to have left upper extremity ischemia with CTA evidence of acute occlusion of the high left proximal brachial artery at the level of the left humeral neck. He then underwent left upper extremity thromboembolectomy via brachial and radial artery approach on 06/15/2022.  He recently had cardiac workup on 05/23/2022 after presenting with shortness of breath and found to have elevated troponin and BNP.  He underwent cardiac cath on 05/25/2022 which showed severe mid LAD stenosis.  It was also noted that left radial artery was unable to be accessed.  His symptoms were felt to be due to severe TR and he was referred to outpatient advanced heart failure clinic.  He was also changed from Coumadin to Eliquis.  Due to left brachial artery occlusion, cardiology was consulted to evaluate for possible cardiac source of embolus. He underwent TEE on 06/16/2022 which revealed a large vegetation involving mitral valve and small subcentimeter vegetation involving aortic valve.  Blood cultures on admission then became positive with E faecalis in 2/3 bottles.  He had initially been started on antibiotics on admission which were transitioned to ampicillin and Rocephin after blood cultures resulted.   Interval History:  Episode overnight noted around 8 PM when patient became forgetful with some slow and slurred speech.  Dysarthria improved after some water.  This morning wife states he seems to be his normal self aside from slightly  delayed mentation.  He had no change in neuroexam on my exam today. Otherwise doing okay and we may start doing discharge planning soon if blood cultures remain negative.   Assessment and Plan:  Endocarditis E. Faecalis bacteremia - underwent cardiology eval for cardiac embolic source for LUE arterial thrombus - s/p TEE on 06/16/22 positive for AV and MV vegetations - ID also following - continue rocephin and amp as per ID -CTS recommends against surgery.  Repeat echo in 2 weeks  Left brachial artery occlusion - Patient presented with left arm pain and heaviness as per HPI.  Workup has been above showed acute occlusion of his left proximal brachial artery (now s/p thrombectomy on 06/15/2022) - vascular following - continue heparin drip -Likely will transition back to Eliquis in the next 1 to 2 days  Acute CVA -Scattered punctate acute to subacute bilateral cerebral infarcts primarily involving white matter noted on MRI brain performed 06/16/2022 -Suspect cardiac embolic source given clinical workup thus far (suspected septic emboli) - neurology aware  History of MV and AV replacement (bioprosthetic's), 10/2018 with Dr. Cornelius Moras CABG x 4 Clipping of LAE Closure of PFO - all took place on 10/11/2018 with Dr. Cornelius Moras  RV failure Chronic dCHF Severe TR - cardiology following - recently started on dig and jardiance as well - recently referred to AHF outpatient as well - remains compensated    Atrial fibrillation - Recent change from warfarin to Eliquis.  Has been taking Eliquis regularly - continue heparin drip  - monitor Hgb and PLTC while on heparin    Hyponatremia -  mild - trend for now   CAD - Status post CABG x 4, see above - Continue home atorvastatin, metoprolol - On heparin as above   Complete heart block - Status post pacemaker.  Paced rhythm observed thus far. - continue tele   Hyperlipidemia - Continue home atorvastatin   History of prostate cancer - Noted    Obesity - Noted   Old records reviewed in assessment of this patient  Antimicrobials: Vancomycin 06/16/2022 x 1 Ancef 5/7 >> 5/8 Cefuroxime 5/7 x 1 Ampicillin 5/9 >> current Rocephin 5/8 >> current   DVT prophylaxis:  SCDs Start: 06/15/22 2009   Code Status:   Code Status: Full Code  Mobility Assessment (last 72 hours)     Mobility Assessment     Row Name 06/19/22 1445 06/19/22 1051 06/19/22 0815 06/18/22 1611 06/18/22 1047   Does patient have an order for bedrest or is patient medically unstable No - Continue assessment -- No - Continue assessment No - Continue assessment No - Continue assessment   What is the highest level of mobility based on the progressive mobility assessment? -- Level 4 (Walks with assist in room) - Balance while marching in place and cannot step forward and back - Complete -- -- Level 5 (Walks with assist in room/hall) - Balance while stepping forward/back and can walk in room with assist - Complete   Is the above level different from baseline mobility prior to current illness? Yes - Recommend PT order -- Yes - Recommend PT order Yes - Recommend PT order Yes - Recommend PT order    Row Name 06/18/22 1004 06/18/22 0900 06/18/22 0800 06/17/22 1410 06/17/22 0830   Does patient have an order for bedrest or is patient medically unstable No - Continue assessment -- No - Continue assessment No - Continue assessment No - Continue assessment   What is the highest level of mobility based on the progressive mobility assessment? Level 5 (Walks with assist in room/hall) - Balance while stepping forward/back and can walk in room with assist - Complete Level 5 (Walks with assist in room/hall) - Balance while stepping forward/back and can walk in room with assist - Complete -- Level 5 (Walks with assist in room/hall) - Balance while stepping forward/back and can walk in room with assist - Complete Level 5 (Walks with assist in room/hall) - Balance while stepping forward/back  and can walk in room with assist - Complete   Is the above level different from baseline mobility prior to current illness? Yes - Recommend PT order -- Yes - Recommend PT order Yes - Recommend PT order Yes - Recommend PT order            Barriers to discharge: none Disposition Plan:  TBD Status is: Inpt  Objective: Blood pressure 114/68, pulse 99, temperature 97.7 F (36.5 C), temperature source Oral, resp. rate 15, height 5\' 7"  (1.702 m), weight 94.8 kg, SpO2 92 %.  Examination:  Physical Exam Constitutional:      Appearance: Normal appearance.  HENT:     Head: Normocephalic and atraumatic.     Mouth/Throat:     Mouth: Mucous membranes are moist.  Eyes:     Extraocular Movements: Extraocular movements intact.  Cardiovascular:     Rate and Rhythm: Normal rate and regular rhythm.  Pulmonary:     Effort: Pulmonary effort is normal.     Breath sounds: Normal breath sounds.  Abdominal:     General: Bowel sounds are normal. There is no  distension.     Palpations: Abdomen is soft.     Tenderness: There is no abdominal tenderness.  Musculoskeletal:     Cervical back: Normal range of motion and neck supple.     Comments: LUE noted with sutures in place and surrounding ecchymoses; mild edema  Skin:    General: Skin is warm and dry.  Neurological:     General: No focal deficit present.     Mental Status: He is alert.     Sensory: No sensory deficit.     Comments: Bilateral lower extremity weakness from deconditioning but nothing focal  Psychiatric:        Mood and Affect: Mood normal.        Behavior: Behavior normal.      Consultants:  ID Vascular surgery Cardiothoracic surgery Neurology Cardiology  Procedures:  06/15/2022: Left upper extremity thromboembolectomy via brachial and radial artery approach 06/15/2022: Left upper extremity angiogram of left subclavian and axillary arteries 06/16/2022: TEE  Data Reviewed: Results for orders placed or performed during the  hospital encounter of 06/15/22 (from the past 24 hour(s))  Heparin level (unfractionated)     Status: Abnormal   Collection Time: 06/19/22  1:23 AM  Result Value Ref Range   Heparin Unfractionated 0.15 (L) 0.30 - 0.70 IU/mL  Basic metabolic panel     Status: Abnormal   Collection Time: 06/19/22  1:23 AM  Result Value Ref Range   Sodium 132 (L) 135 - 145 mmol/L   Potassium 3.1 (L) 3.5 - 5.1 mmol/L   Chloride 100 98 - 111 mmol/L   CO2 22 22 - 32 mmol/L   Glucose, Bld 108 (H) 70 - 99 mg/dL   BUN 16 8 - 23 mg/dL   Creatinine, Ser 1.61 0.61 - 1.24 mg/dL   Calcium 7.9 (L) 8.9 - 10.3 mg/dL   GFR, Estimated >09 >60 mL/min   Anion gap 10 5 - 15  CBC with Differential/Platelet     Status: Abnormal   Collection Time: 06/19/22  1:23 AM  Result Value Ref Range   WBC 13.6 (H) 4.0 - 10.5 K/uL   RBC 3.86 (L) 4.22 - 5.81 MIL/uL   Hemoglobin 9.8 (L) 13.0 - 17.0 g/dL   HCT 45.4 (L) 09.8 - 11.9 %   MCV 84.5 80.0 - 100.0 fL   MCH 25.4 (L) 26.0 - 34.0 pg   MCHC 30.1 30.0 - 36.0 g/dL   RDW 14.7 (H) 82.9 - 56.2 %   Platelets 65 (L) 150 - 400 K/uL   nRBC 0.9 (H) 0.0 - 0.2 %   Neutrophils Relative % 83 %   Neutro Abs 11.2 (H) 1.7 - 7.7 K/uL   Lymphocytes Relative 7 %   Lymphs Abs 1.0 0.7 - 4.0 K/uL   Monocytes Relative 4 %   Monocytes Absolute 0.6 0.1 - 1.0 K/uL   Eosinophils Relative 1 %   Eosinophils Absolute 0.1 0.0 - 0.5 K/uL   Basophils Relative 0 %   Basophils Absolute 0.1 0.0 - 0.1 K/uL   Immature Granulocytes 5 %   Abs Immature Granulocytes 0.67 (H) 0.00 - 0.07 K/uL  Magnesium     Status: None   Collection Time: 06/19/22  1:23 AM  Result Value Ref Range   Magnesium 2.2 1.7 - 2.4 mg/dL  Heparin level (unfractionated)     Status: Abnormal   Collection Time: 06/19/22  3:55 PM  Result Value Ref Range   Heparin Unfractionated 0.12 (L) 0.30 - 0.70 IU/mL  I have reviewed pertinent nursing notes, vitals, labs, and images as necessary. I have ordered labwork to follow up on as indicated.   I have reviewed the last notes from staff over past 24 hours. I have discussed patient's care plan and test results with nursing staff, CM/SW, and other staff as appropriate.  Time spent: Greater than 50% of the 55 minute visit was spent in counseling/coordination of care for the patient as laid out in the A&P.   LOS: 4 days   Lewie Chamber, MD [facility] 06/19/2022, 5:34 PM

## 2022-06-19 NOTE — Progress Notes (Signed)
ANTICOAGULATION CONSULT NOTE  Pharmacy Consult for heparin Indication: Left brachial artery occlusion and CVA  No Known Allergies  Patient Measurements: Height: 5\' 7"  (170.2 cm) Weight: 94.8 kg (209 lb) IBW/kg (Calculated) : 66.1 HEPARIN DW (KG): 86.3   Vital Signs: Temp: 98.3 F (36.8 C) (05/11 0328) Temp Source: Oral (05/11 0328) BP: 100/79 (05/11 0328) Pulse Rate: 96 (05/11 0328)  Labs: Recent Labs    06/16/22 1309 06/17/22 0155 06/17/22 0843 06/17/22 2108 06/18/22 0829 06/19/22 0123  HGB 9.7*  --   --   --  10.7* 9.8*  HCT 31.9*  --   --   --  34.8* 32.6*  PLT 58*  --   --   --  88* 65*  APTT  --  33  --   --   --   --   HEPARINUNFRC  --   --   --  0.47 0.31 0.15*  CREATININE  --   --  1.09  --  1.00 1.05     Estimated Creatinine Clearance: 70.8 mL/min (by C-G formula based on SCr of 1.05 mg/dL).   Medical History: Past Medical History:  Diagnosis Date   BCE (basal cell epithelioma), arm    RIGHT SHOULDER   Bradycardia    Bumps on skin    on left side of nose for last 3 weeks   CAD (coronary artery disease)    a. s/p CABGx2 10/11/18 LIMA to LAD, SVG to RCA, EVH via right thigh.   Dyslipidemia    Ectatic thoracic aorta (HCC)    Esophageal stricture    GERD (gastroesophageal reflux disease)    Heart murmur    sees dr Rose Fillers   Hyperlipidemia    Incidental pulmonary nodule, > 3mm and < 8mm 10/04/2018   Noted on CTA   Inguinal hernia    bilateral   Obesity    Presence of permanent cardiac pacemaker    Prostate cancer (HCC)    S/P aortic valve replacement with bioprosthetic valve 10/11/2018   23 mm Edwards Inspiris Resilia stented bovine pericardial tissue valve, miltral valve done also   S/P CABG x 2 10/11/2018   LIMA to LAD, SVG to RCA, EVH via right thigh   S/P mitral valve replacement with bioprosthetic valve 10/11/2018   27 mm Medtronic Mosaic stented porcine bioprosthetic tissue valve   S/P patent foramen ovale closure 10/11/2018   S/P  placement of cardiac pacemaker    Smoker     Medications:  Medications Prior to Admission  Medication Sig Dispense Refill Last Dose   acetaminophen (TYLENOL) 325 MG tablet Take 650 mg by mouth as needed for moderate pain.   Unk   apixaban (ELIQUIS) 5 MG TABS tablet Take 1 tablet (5 mg total) by mouth 2 (two) times daily. 60 tablet 1 06/14/2022 at 1900   atorvastatin (LIPITOR) 20 MG tablet Take 1 tablet (20 mg total) by mouth daily. 90 tablet 3 06/14/2022   digoxin (LANOXIN) 0.125 MG tablet Take 1 tablet (0.125 mg total) by mouth daily. 90 tablet 3 06/14/2022   empagliflozin (JARDIANCE) 10 MG TABS tablet Take 1 tablet (10 mg total) by mouth daily before breakfast. 30 tablet 11 06/14/2022   furosemide (LASIX) 20 MG tablet Take 20 mg by mouth 2 (two) times daily.   06/14/2022   Iron, Ferrous Sulfate, 325 (65 Fe) MG TABS Take 1 tablet by mouth 2 (two) times daily. (Patient taking differently: Take 1 tablet by mouth daily.) 60 tablet 1 06/14/2022  metoprolol tartrate (LOPRESSOR) 25 MG tablet Take 1.5 tablets (37.5 mg total) by mouth 2 (two) times daily. 180 tablet 3 06/14/2022 at 1900   oxymetazoline (AFRIN) 0.05 % nasal spray Place 1 spray into both nostrils at bedtime as needed for congestion.      polyethylene glycol powder (GLYCOLAX/MIRALAX) 17 GM/SCOOP powder Take 17 g by mouth daily as needed for mild constipation. 238 g 0 Unk   Scheduled:   atorvastatin  20 mg Oral Daily   digoxin  0.125 mg Oral Daily   docusate sodium  100 mg Oral BID   empagliflozin  10 mg Oral QAC breakfast   furosemide  40 mg Oral Daily   metoprolol tartrate  37.5 mg Oral BID   pantoprazole  40 mg Oral Daily   sodium chloride flush  3 mL Intravenous Once   sodium chloride flush  3 mL Intravenous Q12H    Assessment: 72 yo male with left brachial artery occlusion s/p thrombectomy. Initially started on fixed dose heparin at 500 units/hr post-op per Vascular Surgery. He is noted with history of afib and was on apixaban PTA.  Pharmacy consulted to dose heparin - dose ok to be titrated to heparin level goal 0.3-0.5 per MD on 5/9. MRI noted with cerebral infarcts - dosing with no bolus per stroke protocol.  Daily heparin level 0.15 subtherapeutic on 700 units/hr. No issues with heparin infusion or s/sx bleeding noted per RN. Hgb 9.8, PLT 65.   Goal of Therapy:  Heparin level 0.3-0.5 units/ml Monitor platelets by anticoagulation protocol: Yes   Plan:  No bolus Increase heparin to 800 units/hr  Check 8 hour heparin level  Daily heparin level and CBC while on heparin  Monitor for s/sx bleeding  Will follow oral anticoagulation plans  Larena Sox, PharmD PGY1 Pharmacy Resident   06/19/2022  7:31 AM

## 2022-06-19 NOTE — Progress Notes (Signed)
At the beginning of the night shift the patient was asleep. Around 2000 when he awoke he forgot he was at the hospital and thought he was at home. The patient's wife told the nurse that his speech sounded slurred. When the nurse assessed him the patient was more awake and became completely oriented. After drinking some water for dry mouth his speech became clear.  The NIH score was a 0.  The nurse informed the on call physician, who said to keep a close eye on the patient and alert them if anything changes.  Will continue to monitor.  Harriet Masson, RN

## 2022-06-19 NOTE — Progress Notes (Signed)
ANTICOAGULATION CONSULT NOTE  Pharmacy Consult for heparin Indication: Left brachial artery occlusion and CVA  No Known Allergies  Patient Measurements: Height: 5\' 7"  (170.2 cm) Weight: 94.8 kg (209 lb) IBW/kg (Calculated) : 66.1 HEPARIN DW (KG): 86.3   Vital Signs: Temp: 97.7 F (36.5 C) (05/11 1523) Temp Source: Oral (05/11 1523) BP: 114/68 (05/11 1206) Pulse Rate: 99 (05/11 1523)  Labs: Recent Labs    06/17/22 0155 06/17/22 0843 06/17/22 2108 06/18/22 0829 06/19/22 0123 06/19/22 1555  HGB  --   --   --  10.7* 9.8*  --   HCT  --   --   --  34.8* 32.6*  --   PLT  --   --   --  88* 65*  --   APTT 33  --   --   --   --   --   HEPARINUNFRC  --   --    < > 0.31 0.15* 0.12*  CREATININE  --  1.09  --  1.00 1.05  --    < > = values in this interval not displayed.     Estimated Creatinine Clearance: 70.8 mL/min (by C-G formula based on SCr of 1.05 mg/dL).   Medical History: Past Medical History:  Diagnosis Date   BCE (basal cell epithelioma), arm    RIGHT SHOULDER   Bradycardia    Bumps on skin    on left side of nose for last 3 weeks   CAD (coronary artery disease)    a. s/p CABGx2 10/11/18 LIMA to LAD, SVG to RCA, EVH via right thigh.   Dyslipidemia    Ectatic thoracic aorta (HCC)    Esophageal stricture    GERD (gastroesophageal reflux disease)    Heart murmur    sees dr Rose Fillers   Hyperlipidemia    Incidental pulmonary nodule, > 3mm and < 8mm 10/04/2018   Noted on CTA   Inguinal hernia    bilateral   Obesity    Presence of permanent cardiac pacemaker    Prostate cancer (HCC)    S/P aortic valve replacement with bioprosthetic valve 10/11/2018   23 mm Edwards Inspiris Resilia stented bovine pericardial tissue valve, miltral valve done also   S/P CABG x 2 10/11/2018   LIMA to LAD, SVG to RCA, EVH via right thigh   S/P mitral valve replacement with bioprosthetic valve 10/11/2018   27 mm Medtronic Mosaic stented porcine bioprosthetic tissue valve   S/P  patent foramen ovale closure 10/11/2018   S/P placement of cardiac pacemaker    Smoker     Medications:  Medications Prior to Admission  Medication Sig Dispense Refill Last Dose   acetaminophen (TYLENOL) 325 MG tablet Take 650 mg by mouth as needed for moderate pain.   Unk   apixaban (ELIQUIS) 5 MG TABS tablet Take 1 tablet (5 mg total) by mouth 2 (two) times daily. 60 tablet 1 06/14/2022 at 1900   atorvastatin (LIPITOR) 20 MG tablet Take 1 tablet (20 mg total) by mouth daily. 90 tablet 3 06/14/2022   digoxin (LANOXIN) 0.125 MG tablet Take 1 tablet (0.125 mg total) by mouth daily. 90 tablet 3 06/14/2022   empagliflozin (JARDIANCE) 10 MG TABS tablet Take 1 tablet (10 mg total) by mouth daily before breakfast. 30 tablet 11 06/14/2022   furosemide (LASIX) 20 MG tablet Take 20 mg by mouth 2 (two) times daily.   06/14/2022   Iron, Ferrous Sulfate, 325 (65 Fe) MG TABS Take 1 tablet by mouth 2 (  two) times daily. (Patient taking differently: Take 1 tablet by mouth daily.) 60 tablet 1 06/14/2022   metoprolol tartrate (LOPRESSOR) 25 MG tablet Take 1.5 tablets (37.5 mg total) by mouth 2 (two) times daily. 180 tablet 3 06/14/2022 at 1900   oxymetazoline (AFRIN) 0.05 % nasal spray Place 1 spray into both nostrils at bedtime as needed for congestion.      polyethylene glycol powder (GLYCOLAX/MIRALAX) 17 GM/SCOOP powder Take 17 g by mouth daily as needed for mild constipation. 238 g 0 Unk   Scheduled:   atorvastatin  20 mg Oral Daily   digoxin  0.125 mg Oral Daily   docusate sodium  100 mg Oral BID   empagliflozin  10 mg Oral QAC breakfast   furosemide  40 mg Oral Daily   metoprolol tartrate  37.5 mg Oral BID   pantoprazole  40 mg Oral Daily   sodium chloride flush  3 mL Intravenous Once   sodium chloride flush  3 mL Intravenous Q12H    Assessment: 72 yo male with left brachial artery occlusion s/p thrombectomy. Initially started on fixed dose heparin at 500 units/hr post-op per Vascular Surgery. He is noted with  history of afib and was on apixaban PTA. Pharmacy consulted to dose heparin - dose ok to be titrated to heparin level goal 0.3-0.5 per MD on 5/9. MRI noted with cerebral infarcts - dosing with no bolus per stroke protocol.  Heparin level subtherapeutic this PM  Goal of Therapy:  Heparin level 0.3-0.5 units/ml Monitor platelets by anticoagulation protocol: Yes   Plan:  No bolus Increase heparin to 950 units/hr  Check 8 hour heparin level  Daily heparin level and CBC while on heparin  Monitor for s/sx bleeding  Will follow oral anticoagulation plans  Thank you Okey Regal, PharmD  06/19/2022  5:16 PM

## 2022-06-20 DIAGNOSIS — I639 Cerebral infarction, unspecified: Secondary | ICD-10-CM | POA: Diagnosis not present

## 2022-06-20 DIAGNOSIS — I70208 Unspecified atherosclerosis of native arteries of extremities, other extremity: Secondary | ICD-10-CM | POA: Diagnosis not present

## 2022-06-20 DIAGNOSIS — I998 Other disorder of circulatory system: Secondary | ICD-10-CM | POA: Diagnosis not present

## 2022-06-20 DIAGNOSIS — R7881 Bacteremia: Secondary | ICD-10-CM | POA: Diagnosis not present

## 2022-06-20 LAB — CBC WITH DIFFERENTIAL/PLATELET
Abs Immature Granulocytes: 0.68 10*3/uL — ABNORMAL HIGH (ref 0.00–0.07)
Basophils Absolute: 0.1 10*3/uL (ref 0.0–0.1)
Basophils Relative: 0 %
Eosinophils Absolute: 0.1 10*3/uL (ref 0.0–0.5)
Eosinophils Relative: 1 %
HCT: 32.8 % — ABNORMAL LOW (ref 39.0–52.0)
Hemoglobin: 10 g/dL — ABNORMAL LOW (ref 13.0–17.0)
Immature Granulocytes: 5 %
Lymphocytes Relative: 7 %
Lymphs Abs: 0.9 10*3/uL (ref 0.7–4.0)
MCH: 25.6 pg — ABNORMAL LOW (ref 26.0–34.0)
MCHC: 30.5 g/dL (ref 30.0–36.0)
MCV: 84.1 fL (ref 80.0–100.0)
Monocytes Absolute: 0.8 10*3/uL (ref 0.1–1.0)
Monocytes Relative: 6 %
Neutro Abs: 11.6 10*3/uL — ABNORMAL HIGH (ref 1.7–7.7)
Neutrophils Relative %: 81 %
Platelets: 58 10*3/uL — ABNORMAL LOW (ref 150–400)
RBC: 3.9 MIL/uL — ABNORMAL LOW (ref 4.22–5.81)
RDW: 21.4 % — ABNORMAL HIGH (ref 11.5–15.5)
WBC: 14.2 10*3/uL — ABNORMAL HIGH (ref 4.0–10.5)
nRBC: 0.4 % — ABNORMAL HIGH (ref 0.0–0.2)

## 2022-06-20 LAB — BASIC METABOLIC PANEL
Anion gap: 10 (ref 5–15)
BUN: 12 mg/dL (ref 8–23)
CO2: 23 mmol/L (ref 22–32)
Calcium: 8 mg/dL — ABNORMAL LOW (ref 8.9–10.3)
Chloride: 101 mmol/L (ref 98–111)
Creatinine, Ser: 0.91 mg/dL (ref 0.61–1.24)
GFR, Estimated: >60 mL/min (ref >60)
Glucose, Bld: 103 mg/dL — ABNORMAL HIGH (ref 70–99)
Potassium: 4 mmol/L (ref 3.5–5.1)
Sodium: 134 mmol/L — ABNORMAL LOW (ref 135–145)

## 2022-06-20 LAB — CULTURE, BLOOD (ROUTINE X 2): Special Requests: ADEQUATE

## 2022-06-20 LAB — MAGNESIUM: Magnesium: 2.3 mg/dL (ref 1.7–2.4)

## 2022-06-20 LAB — HEPARIN LEVEL (UNFRACTIONATED): Heparin Unfractionated: 0.11 IU/mL — ABNORMAL LOW (ref 0.30–0.70)

## 2022-06-20 MED ORDER — APIXABAN 5 MG PO TABS
5.0000 mg | ORAL_TABLET | Freq: Two times a day (BID) | ORAL | Status: DC
  Administered 2022-06-20 – 2022-06-22 (×5): 5 mg via ORAL
  Filled 2022-06-20 (×5): qty 1

## 2022-06-20 NOTE — Progress Notes (Signed)
ANTICOAGULATION CONSULT NOTE - Follow Up Consult  Pharmacy Consult for heparin Indication:  left brachial artery occlusion s/p thrombectomy and h/o Afib in setting of cerebral infarcts  Labs: Recent Labs    06/18/22 0829 06/19/22 0123 06/19/22 1555 06/20/22 0119  HGB 10.7* 9.8*  --  10.0*  HCT 34.8* 32.6*  --  32.8*  PLT 88* 65*  --  58*  HEPARINUNFRC 0.31 0.15* 0.12* 0.11*  CREATININE 1.00 1.05  --  0.91    Assessment: 71yo male remains subtherapeutic on heparin after rate change; no infusion issues or signs of bleeding per RN.  Goal of Therapy:  Heparin level 0.3-0.5 units/ml   Plan:  Increase heparin infusion by 3 units/kgABW/hr to 1200 units/hr. Check level in 6 hours.   Vernard Gambles, PharmD, BCPS 06/20/2022 3:26 AM

## 2022-06-20 NOTE — Progress Notes (Signed)
Progress Note    [name]   ZOX:[id number]  DOB: Nov 07, 1950  DOA: 06/15/2022     5 PCP: [name], MD  Initial CC: left arm pain  Hospital Course: [name] is a 72 yo male with PMH atrial fib, PAD, CAD s/p CABG, CHB s/p PPM, obesity, AV replacement (bioprosthetic), MV replacement (bioprosthetic), HLD, prostate cancer who initially presented to the hospital on 06/15/2022 with left hand numbness and pain. He was admitted and underwent evaluation with vascular surgery.  He was found to have left upper extremity ischemia with CTA evidence of acute occlusion of the high left proximal brachial artery at the level of the left humeral neck. He then underwent left upper extremity thromboembolectomy via brachial and radial artery approach on 06/15/2022.  He recently had cardiac workup on 05/23/2022 after presenting with shortness of breath and found to have elevated troponin and BNP.  He underwent cardiac cath on 05/25/2022 which showed severe mid LAD stenosis.  It was also noted that left radial artery was unable to be accessed.  His symptoms were felt to be due to severe TR and he was referred to outpatient advanced heart failure clinic.  He was also changed from Coumadin to Eliquis.  Due to left brachial artery occlusion, cardiology was consulted to evaluate for possible cardiac source of embolus. He underwent TEE on 06/16/2022 which revealed a large vegetation involving mitral valve and small subcentimeter vegetation involving aortic valve.  Blood cultures on admission then became positive with E faecalis in 2/3 bottles.  He had initially been started on antibiotics on admission which were transitioned to ampicillin and Rocephin after blood cultures resulted.   Interval History:  No notes overnight.  Sitting up in recliner comfortably this morning.  No new concerns or complaints. Difficulty with heparin drip achieving therapeutic level, therefore we will transition back to Eliquis  today.  Assessment and Plan:  Endocarditis E. Faecalis bacteremia - underwent cardiology eval for cardiac embolic source for LUE arterial thrombus - s/p TEE on 06/16/22 positive for AV and MV vegetations - ID also following - continue rocephin and amp as per ID -CTS recommends against surgery.  Repeat echo in 2 weeks  Left brachial artery occlusion - Patient presented with left arm pain and heaviness as per HPI.  Workup has been above showed acute occlusion of his left proximal brachial artery (now s/p thrombectomy on 06/15/2022) - vascular following -Discontinue heparin drip, difficulty obtaining therapeutic levels - Resume Eliquis on 06/20/2022  Acute CVA -Scattered punctate acute to subacute bilateral cerebral infarcts primarily involving white matter noted on MRI brain performed 06/16/2022 -Suspect cardiac embolic source given clinical workup thus far (suspected septic emboli) - neurology aware  History of MV and AV replacement (bioprosthetic's), 10/2018 with Dr. Cornelius Moras CABG x 4 Clipping of LAE Closure of PFO - all took place on 10/11/2018 with Dr. Cornelius Moras  RV failure Chronic dCHF Severe TR - cardiology following - recently started on dig and jardiance as well - recently referred to AHF outpatient as well - remains compensated    Atrial fibrillation - Recent change from warfarin to Eliquis.  Has been taking Eliquis regularly -Eliquis resumed 06/20/2022 - monitor Hgb and PLTC   Hyponatremia - mild - trend for now   CAD - Status post CABG x 4, see above - Continue home atorvastatin, metoprolol - On heparin as above   Complete heart block - Status post pacemaker.  Paced rhythm observed thus far. - continue tele  Hyperlipidemia - Continue home atorvastatin   History of prostate cancer - Noted   Obesity - Noted   Old records reviewed in assessment of this patient  Antimicrobials: Vancomycin 06/16/2022 x 1 Ancef 5/7 >> 5/8 Cefuroxime 5/7 x 1 Ampicillin 5/9 >>  current Rocephin 5/8 >> current   DVT prophylaxis:  SCDs Start: 06/15/22 2009 apixaban (ELIQUIS) tablet 5 mg   Code Status:   Code Status: Full Code  Mobility Assessment (last 72 hours)     Mobility Assessment     Row Name 06/20/22 0800 06/19/22 2100 06/19/22 1445 06/19/22 1051 06/19/22 0815   Does patient have an order for bedrest or is patient medically unstable No - Continue assessment No - Continue assessment No - Continue assessment -- No - Continue assessment   What is the highest level of mobility based on the progressive mobility assessment? Level 5 (Walks with assist in room/hall) - Balance while stepping forward/back and can walk in room with assist - Complete Level 5 (Walks with assist in room/hall) - Balance while stepping forward/back and can walk in room with assist - Complete -- Level 4 (Walks with assist in room) - Balance while marching in place and cannot step forward and back - Complete --   Is the above level different from baseline mobility prior to current illness? Yes - Recommend PT order Yes - Recommend PT order Yes - Recommend PT order -- Yes - Recommend PT order    Row Name 06/18/22 1611 06/18/22 1047 06/18/22 1004 06/18/22 0900 06/18/22 0800   Does patient have an order for bedrest or is patient medically unstable No - Continue assessment No - Continue assessment No - Continue assessment -- No - Continue assessment   What is the highest level of mobility based on the progressive mobility assessment? -- Level 5 (Walks with assist in room/hall) - Balance while stepping forward/back and can walk in room with assist - Complete Level 5 (Walks with assist in room/hall) - Balance while stepping forward/back and can walk in room with assist - Complete Level 5 (Walks with assist in room/hall) - Balance while stepping forward/back and can walk in room with assist - Complete --   Is the above level different from baseline mobility prior to current illness? Yes - Recommend PT  order Yes - Recommend PT order Yes - Recommend PT order -- Yes - Recommend PT order    Row Name 06/17/22 1410           Does patient have an order for bedrest or is patient medically unstable No - Continue assessment       What is the highest level of mobility based on the progressive mobility assessment? Level 5 (Walks with assist in room/hall) - Balance while stepping forward/back and can walk in room with assist - Complete       Is the above level different from baseline mobility prior to current illness? Yes - Recommend PT order                Barriers to discharge: none Disposition Plan:  TBD Status is: Inpt  Objective: Blood pressure 104/73, pulse (!) 102, temperature 97.7 F (36.5 C), temperature source Oral, resp. rate 16, height 5\' 7"  (1.702 m), weight 94.8 kg, SpO2 92 %.  Examination:  Physical Exam Constitutional:      Appearance: Normal appearance.  HENT:     Head: Normocephalic and atraumatic.     Mouth/Throat:     Mouth: Mucous membranes are  moist.  Eyes:     Extraocular Movements: Extraocular movements intact.  Cardiovascular:     Rate and Rhythm: Normal rate and regular rhythm.  Pulmonary:     Effort: Pulmonary effort is normal.     Breath sounds: Normal breath sounds.  Abdominal:     General: Bowel sounds are normal. There is no distension.     Palpations: Abdomen is soft.     Tenderness: There is no abdominal tenderness.  Musculoskeletal:     Cervical back: Normal range of motion and neck supple.     Comments: LUE noted with sutures in place and surrounding ecchymoses; mild edema  Skin:    General: Skin is warm and dry.  Neurological:     General: No focal deficit present.     Mental Status: He is alert.     Sensory: No sensory deficit.     Comments: Bilateral lower extremity weakness from deconditioning but nothing focal  Psychiatric:        Mood and Affect: Mood normal.        Behavior: Behavior normal.      Consultants:  ID Vascular  surgery Cardiothoracic surgery Neurology Cardiology  Procedures:  06/15/2022: Left upper extremity thromboembolectomy via brachial and radial artery approach 06/15/2022: Left upper extremity angiogram of left subclavian and axillary arteries 06/16/2022: TEE  Data Reviewed: Results for orders placed or performed during the hospital encounter of 06/15/22 (from the past 24 hour(s))  Heparin level (unfractionated)     Status: Abnormal   Collection Time: 06/19/22  3:55 PM  Result Value Ref Range   Heparin Unfractionated 0.12 (L) 0.30 - 0.70 IU/mL  Basic metabolic panel     Status: Abnormal   Collection Time: 06/20/22  1:19 AM  Result Value Ref Range   Sodium 134 (L) 135 - 145 mmol/L   Potassium 4.0 3.5 - 5.1 mmol/L   Chloride 101 98 - 111 mmol/L   CO2 23 22 - 32 mmol/L   Glucose, Bld 103 (H) 70 - 99 mg/dL   BUN 12 8 - 23 mg/dL   Creatinine, Ser 1.61 0.61 - 1.24 mg/dL   Calcium 8.0 (L) 8.9 - 10.3 mg/dL   GFR, Estimated >09 >60 mL/min   Anion gap 10 5 - 15  CBC with Differential/Platelet     Status: Abnormal   Collection Time: 06/20/22  1:19 AM  Result Value Ref Range   WBC 14.2 (H) 4.0 - 10.5 K/uL   RBC 3.90 (L) 4.22 - 5.81 MIL/uL   Hemoglobin 10.0 (L) 13.0 - 17.0 g/dL   HCT 45.4 (L) 09.8 - 11.9 %   MCV 84.1 80.0 - 100.0 fL   MCH 25.6 (L) 26.0 - 34.0 pg   MCHC 30.5 30.0 - 36.0 g/dL   RDW 14.7 (H) 82.9 - 56.2 %   Platelets 58 (L) 150 - 400 K/uL   nRBC 0.4 (H) 0.0 - 0.2 %   Neutrophils Relative % 81 %   Neutro Abs 11.6 (H) 1.7 - 7.7 K/uL   Lymphocytes Relative 7 %   Lymphs Abs 0.9 0.7 - 4.0 K/uL   Monocytes Relative 6 %   Monocytes Absolute 0.8 0.1 - 1.0 K/uL   Eosinophils Relative 1 %   Eosinophils Absolute 0.1 0.0 - 0.5 K/uL   Basophils Relative 0 %   Basophils Absolute 0.1 0.0 - 0.1 K/uL   Immature Granulocytes 5 %   Abs Immature Granulocytes 0.68 (H) 0.00 - 0.07 K/uL  Magnesium  Status: None   Collection Time: 06/20/22  1:19 AM  Result Value Ref Range   Magnesium 2.3  1.7 - 2.4 mg/dL  Heparin level (unfractionated)     Status: Abnormal   Collection Time: 06/20/22  1:19 AM  Result Value Ref Range   Heparin Unfractionated 0.11 (L) 0.30 - 0.70 IU/mL    I have reviewed pertinent nursing notes, vitals, labs, and images as necessary. I have ordered labwork to follow up on as indicated.  I have reviewed the last notes from staff over past 24 hours. I have discussed patient's care plan and test results with nursing staff, CM/SW, and other staff as appropriate.  Time spent: Greater than 50% of the 55 minute visit was spent in counseling/coordination of care for the patient as laid out in the A&P.   LOS: 5 days   Lewie Chamber, MD [facility] 06/20/2022, 1:11 PM

## 2022-06-21 ENCOUNTER — Other Ambulatory Visit: Payer: Self-pay

## 2022-06-21 DIAGNOSIS — Z952 Presence of prosthetic heart valve: Secondary | ICD-10-CM | POA: Diagnosis not present

## 2022-06-21 DIAGNOSIS — I998 Other disorder of circulatory system: Secondary | ICD-10-CM | POA: Diagnosis not present

## 2022-06-21 DIAGNOSIS — I70208 Unspecified atherosclerosis of native arteries of extremities, other extremity: Secondary | ICD-10-CM | POA: Diagnosis not present

## 2022-06-21 DIAGNOSIS — R7881 Bacteremia: Secondary | ICD-10-CM | POA: Diagnosis not present

## 2022-06-21 DIAGNOSIS — I330 Acute and subacute infective endocarditis: Secondary | ICD-10-CM | POA: Diagnosis not present

## 2022-06-21 DIAGNOSIS — I5032 Chronic diastolic (congestive) heart failure: Secondary | ICD-10-CM | POA: Diagnosis not present

## 2022-06-21 LAB — BASIC METABOLIC PANEL
Anion gap: 4 — ABNORMAL LOW (ref 5–15)
BUN: 12 mg/dL (ref 8–23)
CO2: 22 mmol/L (ref 22–32)
Calcium: 7.7 mg/dL — ABNORMAL LOW (ref 8.9–10.3)
Chloride: 104 mmol/L (ref 98–111)
Creatinine, Ser: 0.97 mg/dL (ref 0.61–1.24)
GFR, Estimated: >60 mL/min (ref >60)
Glucose, Bld: 107 mg/dL — ABNORMAL HIGH (ref 70–99)
Potassium: 3.6 mmol/L (ref 3.5–5.1)
Sodium: 130 mmol/L — ABNORMAL LOW (ref 135–145)

## 2022-06-21 LAB — CBC WITH DIFFERENTIAL/PLATELET
Abs Immature Granulocytes: 0.41 10*3/uL — ABNORMAL HIGH (ref 0.00–0.07)
Basophils Absolute: 0 10*3/uL (ref 0.0–0.1)
Basophils Relative: 0 %
Eosinophils Absolute: 0.2 10*3/uL (ref 0.0–0.5)
Eosinophils Relative: 1 %
HCT: 31.6 % — ABNORMAL LOW (ref 39.0–52.0)
Hemoglobin: 9.5 g/dL — ABNORMAL LOW (ref 13.0–17.0)
Immature Granulocytes: 3 %
Lymphocytes Relative: 7 %
Lymphs Abs: 0.9 10*3/uL (ref 0.7–4.0)
MCH: 25 pg — ABNORMAL LOW (ref 26.0–34.0)
MCHC: 30.1 g/dL (ref 30.0–36.0)
MCV: 83.2 fL (ref 80.0–100.0)
Monocytes Absolute: 0.8 10*3/uL (ref 0.1–1.0)
Monocytes Relative: 6 %
Neutro Abs: 9.9 10*3/uL — ABNORMAL HIGH (ref 1.7–7.7)
Neutrophils Relative %: 83 %
Platelets: 72 10*3/uL — ABNORMAL LOW (ref 150–400)
RBC: 3.8 MIL/uL — ABNORMAL LOW (ref 4.22–5.81)
RDW: 21.7 % — ABNORMAL HIGH (ref 11.5–15.5)
WBC: 12.1 10*3/uL — ABNORMAL HIGH (ref 4.0–10.5)
nRBC: 0.2 % (ref 0.0–0.2)

## 2022-06-21 LAB — ECHO TEE
AV Mean grad: 6 mmHg
AV Peak grad: 10.5 mmHg
Ao pk vel: 1.62 m/s

## 2022-06-21 LAB — CULTURE, BLOOD (ROUTINE X 2): Culture: NO GROWTH

## 2022-06-21 LAB — MAGNESIUM: Magnesium: 2.3 mg/dL (ref 1.7–2.4)

## 2022-06-21 MED ORDER — SODIUM CHLORIDE 0.9% FLUSH: 10.0000 mL | INTRAVENOUS | Status: DC | PRN

## 2022-06-21 MED ORDER — CEFTRIAXONE IV (FOR PTA / DISCHARGE USE ONLY)
2.0000 g | Freq: Two times a day (BID) | INTRAVENOUS | 0 refills | Status: AC
Start: 2022-06-21 — End: 2022-07-30

## 2022-06-21 MED ORDER — SODIUM CHLORIDE 0.9% FLUSH
10.0000 mL | Freq: Two times a day (BID) | INTRAVENOUS | Status: DC
  Administered 2022-06-21: 10 mL

## 2022-06-21 MED ORDER — CHLORHEXIDINE GLUCONATE CLOTH 2 % EX PADS
6.0000 | MEDICATED_PAD | Freq: Every day | CUTANEOUS | Status: DC
  Administered 2022-06-21: 6 via TOPICAL

## 2022-06-21 MED ORDER — AMPICILLIN IV (FOR PTA / DISCHARGE USE ONLY)
12.0000 g | INTRAVENOUS | 0 refills | Status: AC
Start: 2022-06-21 — End: 2022-07-30

## 2022-06-21 NOTE — TOC Initial Note (Signed)
Transition of Care (TOC) - Initial/Assessment Note  Donn Pierini RN, BSN Transitions of Care Unit 4E- RN Case Manager See Treatment Team for direct phone #   Patient Details  Name: [name] MRN: [id number] Date of Birth: March 29, 1950  Transition of Care [facility]) CM/SW Contact:    Darrold Span, RN Phone Number: 06/21/2022, 3:36 PM  Clinical Narrative:                 Noted plan for PICC line placement today and pt will need LT IV abx- end date 07/30/22.   CM spoke with pt at bedside to discuss transition needs, per pt he is already active with [facility] for home therapy, would like to continue services with [facility]- CM will reach out to liaison and see if they can add RN for home abx needs. Explained to pt that [facility] would not be available to come on a daily basis but would make weekly visits for PICC line care and lab needs. Pt voiced his wife would be able to assist- however wife is a little nervous about doing the abx.  Reassured pt that education would be provided to him and wife prior to discharge to make sure they are comfortable with home abx needs. Pt voiced he does not have a preference for infusion provider.   No DME needs noted. Pt voiced his wife would transport home.   Call made to [facility]- confirmed that [facility] can provide needed services for home abx and therapy- they will follow for re-start visit on discharge for abx needs.  MD to place new orders for HHRN/PT/OT.  Call also made to [facility] w/ [facility] for home abx needs- Pam will follow for OPAT and abx education with pt and wife- per Pam pt will be an in hospital hook up to the home cont. Abx that he will need. Anticipate a later afternoon discharge for coordination with home infusion pharmacy.   TOC will continue to follow.   Expected Discharge Plan: Home w Home Health Services Barriers to Discharge: Continued Medical Work up   Patient Goals and CMS Choice Patient states their goals for this hospitalization  and ongoing recovery are:: to return home CMS Medicare.gov Compare Post Acute Care list provided to:: Patient Choice offered to / list presented to : Patient      Expected Discharge Plan and Services   Discharge Planning Services: CM Consult Post Acute Care Choice: Home Health Living arrangements for the past 2 months: Single Family Home                 DME Arranged: N/A DME Agency: NA       HH Arranged: RN, PT, OT, IV Antibiotics HH Agency: [facility], [facility] Date [facility] Agency Contacted: 06/21/22 Time HH Agency Contacted: 1430 Representative spoke with at [facility] Agency: Cory/Pam  Prior Living Arrangements/Services Living arrangements for the past 2 months: Single Family Home Lives with:: Self, Spouse   Do you feel safe going back to the place where you live?: Yes      Need for Family Participation in Patient Care: Yes (Comment) Care giver support system in place?: Yes (comment)   Criminal Activity/Legal Involvement Pertinent to Current Situation/Hospitalization: No - Comment as needed  Activities of Daily Living Home Assistive Devices/Equipment: Eyeglasses ADL Screening (condition at time of admission) Patient's cognitive ability adequate to safely complete daily activities?: Yes Is the patient deaf or have difficulty hearing?: Yes Does the patient have difficulty seeing, even  when wearing glasses/contacts?: No Does the patient have difficulty concentrating, remembering, or making decisions?: No Patient able to express need for assistance with ADLs?: Yes Does the patient have difficulty dressing or bathing?: No Independently performs ADLs?: Yes (appropriate for developmental age) Does the patient have difficulty walking or climbing stairs?: Yes Weakness of Legs: Left Weakness of Arms/Hands: None  Permission Sought/Granted Permission sought to share information with : Facility Industrial/product designer granted to share information with : Yes, Verbal  Permission Granted  Share Information with NAME: Marylu Lund  Permission granted to share info w AGENCY: [facility]  Permission granted to share info w Relationship: wife  Permission granted to share info w Contact Information: [phone]  Emotional Assessment Appearance:: Appears stated age Attitude/Demeanor/Rapport: Engaged Affect (typically observed): Appropriate Orientation: : Oriented to Self, Oriented to Place, Oriented to  Time, Oriented to Situation Alcohol / Substance Use: Not Applicable Psych Involvement: No (comment)  Admission diagnosis:  Arterial occlusion [I70.90] Brachial artery occlusion, left (HCC) [I70.208] Ischemia of left upper extremity [I99.8] Patient Active Problem List   Diagnosis Date Noted   Chronic diastolic heart failure (HCC) 06/18/2022   Endocarditis 06/17/2022   Bacteremia due to Enterococcus 06/17/2022   Acute CVA (cerebrovascular accident) (HCC) 06/17/2022   Ischemia of left upper extremity 06/15/2022   Arterial occlusion 06/15/2022   Brachial artery occlusion, left (HCC) 06/15/2022   Paroxysmal A-fib (HCC) 06/15/2022   Secondary hypercoagulable state (HCC) 06/15/2022   History of mitral valve repair 06/15/2022   Demand ischemia 05/25/2022   NSTEMI (non-ST elevated myocardial infarction) (HCC) 05/23/2022   Senile purpura (HCC) 05/18/2022   Atrial flutter (HCC) 09/02/2021   Atherosclerosis of aorta (HCC) 04/30/2019   Actinic keratosis 04/30/2019   Malignant neoplasm of prostate (HCC) 02/06/2019   Complete heart block (HCC) 01/18/2019   Pacemaker 01/18/2019   Encounter for therapeutic drug monitoring 10/23/2018   H/O aortic valve replacement 10/11/2018   S/P CABG x 2 10/11/2018   History of mitral valve replacement 10/11/2018   Incidental pulmonary nodule, > 3mm and < 8mm 10/04/2018   Coronary artery disease involving native coronary artery of native heart without angina pectoris    Mixed hyperlipidemia 06/15/2017   Hypogonadism in male  11/05/2015   Former smoker 12/27/2011   Obesity (BMI 30-39.9) 12/27/2011   History of esophageal stricture 12/27/2011   PCP:  [name], MD Pharmacy:   [facility], [place] - [address] [place] [address] Phone: [phone] Fax: [phone]  [facility] [address] [place] [address] Phone: [phone] Fax: [phone]     Social Determinants of Health (SDOH) Social History: SDOH Screenings   Food Insecurity: No Food Insecurity (06/16/2022)  Housing: Low Risk  (06/16/2022)  Transportation Needs: No Transportation Needs (06/16/2022)  Utilities: Not At Risk (06/16/2022)  Depression (PHQ2-9): Low Risk  (05/11/2022)  Financial Resource Strain: Low Risk  (05/11/2022)  Physical Activity: Inactive (05/11/2022)  Stress: No Stress Concern Present (05/11/2022)  Tobacco Use: Medium Risk (06/16/2022)   SDOH Interventions:     Readmission Risk Interventions     No data to display

## 2022-06-21 NOTE — Progress Notes (Signed)
Physical Therapy Treatment Patient Details Name: [name] MRN: [id number] DOB: [birth year] Today's Date: 06/21/2022   History of Present Illness 72 yo admitted on 5/7 for code stroke secondary to numbness and heaviness in L UE. CT/CTA of the head and neck which were negative.  CTA of the chest does reveal a high right brachial arterial occlusion. S/p 5/7 L UE thromboembolectomy and angiogram with catheter selection of left subclavian and axillary arteries s/p5/8 TEE with concern for MV vegetation. PMHx:  NSTEMI 4/24, anemia, pacemaker, PFO closure, LAA clip, complete heart block, a-flutter, HLD, prostate CA,    PT Comments    Pt progressing well towards all goals. Pt denies pain. Pt with improved ambulation tolerance to 100' with RW with 2/4 DOE. HR in 120s. Pt now min G/minA for transfers as well. Pt to continue to benefit from RW for safe ambulation at this time. Pt remains appropriate for current d/c recommendations. Acute PT to cont to follow.    Recommendations for follow up therapy are one component of a multi-disciplinary discharge planning process, led by the attending physician.  Recommendations may be updated based on patient status, additional functional criteria and insurance authorization.  Follow Up Recommendations       Assistance Recommended at Discharge Intermittent Supervision/Assistance  Patient can return home with the following A little help with walking and/or transfers;A little help with bathing/dressing/bathroom;Assistance with cooking/housework;Direct supervision/assist for medications management;Direct supervision/assist for financial management;Assist for transportation;Help with stairs or ramp for entrance   Equipment Recommendations  None recommended by PT    Recommendations for Other Services       Precautions / Restrictions Precautions Precautions: Fall Restrictions Weight Bearing Restrictions: No LUE Weight Bearing: Non weight bearing Other  Position/Activity Restrictions: No formal orders that restrict ROM or WB.  Advised to move to tolerance: gentle ROM throughout the day.     Mobility  Bed Mobility Overal bed mobility: Needs Assistance Bed Mobility: Supine to Sit     Supine to sit: Supervision     General bed mobility comments: increased time, used bed rail    Transfers Overall transfer level: Needs assistance Equipment used: Rolling walker (2 wheels) Transfers: Sit to/from Stand Sit to Stand: Min guard           General transfer comment: min guard for safety, vc for hand placement, increased time    Ambulation/Gait Ambulation/Gait assistance: Min guard Gait Distance (Feet): 100 Feet Assistive device: Rolling walker (2 wheels) Gait Pattern/deviations: Step-through pattern, Narrow base of support, Trunk flexed, Decreased stride length Gait velocity: reduced Gait velocity interpretation: 1.31 - 2.62 ft/sec, indicative of limited community ambulator   General Gait Details: min guard for safety, HR 120s, denies onset of pain, pt reports mild SOB, SpO2 >95% on RA   Stairs             Wheelchair Mobility    Modified Rankin (Stroke Patients Only)       Balance Overall balance assessment: Needs assistance Sitting-balance support: Feet supported Sitting balance-Leahy Scale: Good     Standing balance support: Reliant on assistive device for balance Standing balance-Leahy Scale: Fair Standing balance comment: continues to improve however continues to require use of RW for safe amb                            Cognition Arousal/Alertness: Awake/alert Behavior During Therapy: WFL for tasks assessed/performed Overall Cognitive Status: Within Functional Limits for tasks assessed  Exercises      General Comments General comments (skin integrity, edema, etc.): VSS on RA, 2/4 DOE, HR 120s      Pertinent Vitals/Pain Pain  Assessment Pain Assessment: No/denies pain    Home Living                          Prior Function            PT Goals (current goals can now be found in the care plan section) Acute Rehab PT Goals Patient Stated Goal: home tomorrow PT Goal Formulation: With patient Time For Goal Achievement: 07/02/22 Potential to Achieve Goals: Good Progress towards PT goals: Progressing toward goals    Frequency    Min 1X/week      PT Plan Current plan remains appropriate    Co-evaluation              AM-PAC PT "6 Clicks" Mobility   Outcome Measure  Help needed turning from your back to your side while in a flat bed without using bedrails?: None Help needed moving from lying on your back to sitting on the side of a flat bed without using bedrails?: A Little Help needed moving to and from a bed to a chair (including a wheelchair)?: A Little Help needed standing up from a chair using your arms (e.g., wheelchair or bedside chair)?: A Little Help needed to walk in hospital room?: A Little Help needed climbing 3-5 steps with a railing? : A Little 6 Click Score: 19    End of Session Equipment Utilized During Treatment: Gait belt Activity Tolerance: Patient tolerated treatment well Patient left: in chair;with call bell/phone within reach;with chair alarm set Nurse Communication: Mobility status PT Visit Diagnosis: Muscle weakness (generalized) (M62.81);Difficulty in walking, not elsewhere classified (R26.2) Pain - Right/Left: Left Pain - part of body: Knee     Time: 4098-1191 PT Time Calculation (min) (ACUTE ONLY): 18 min  Charges:  $Gait Training: 8-22 mins                     Lewis Shock, PT, DPT Acute Rehabilitation Services Secure chat preferred Office #: [phone]    [name] 06/21/2022, 12:45 PM

## 2022-06-21 NOTE — Progress Notes (Signed)
Nutrition Follow-up  DOCUMENTATION CODES:   Not applicable  INTERVENTION:  Continue regular diet -FR per physician discretion   Encourage po intake   NUTRITION DIAGNOSIS:   Increased nutrient needs related to acute illness as evidenced by estimated needs.   GOAL:   Patient will meet greater than or equal to 90% of their needs -met   MONITOR:   PO intake, Supplement acceptance, Weight trends, Labs  REASON FOR ASSESSMENT:   Malnutrition Screening Tool    ASSESSMENT:   Pt with hx of CAD (s/p CABG x2), HLD, GERD, and hx prostate cancer presented to ED from outside hospital with decreased sensation and numbness to the left arm. Pt found to have acute occlusion of the high left brachial artery  POD 5 L arm thrombectomy   Labs: Na 130, Glu 107 Meds: omnipen, rocephin, colace, jardiance, lasix, protonix, NS PO: 75-100% meal intake at all meals  I/O's: -1L   Visited patient at bedside who was eating lunch. He reports good appetite and that great po intake which RD could also see in flowsheet meal documentation.   No reported N/V/D/C, trouble chewing/swallowing    NUTRITION - FOCUSED PHYSICAL EXAM:  Unable to obtain, patient had lunch in front of him while sitting in the recliner  Diet Order:   Diet Order             Diet regular Room service appropriate? Yes with Assist; Fluid consistency: Thin; Fluid restriction: 2000 mL Fluid  Diet effective now                   EDUCATION NEEDS:   No education needs have been identified at this time  Skin:  Skin Assessment: Reviewed RN Assessment  Last BM:  5/12  Height:   Ht Readings from Last 1 Encounters:  06/15/22 5\' 7"  (1.702 m)    Weight:   Wt Readings from Last 1 Encounters:  06/15/22 94.8 kg    Ideal Body Weight:  67.3 kg  BMI:  Body mass index is 32.73 kg/m.  Estimated Nutritional Needs:   Kcal:  1900-2100 kcal/d  Protein:  95-110 g/d  Fluid:  >/=2L/d    Leodis Rains, RDN, LDN   Clinical Nutrition

## 2022-06-21 NOTE — Progress Notes (Addendum)
  Progress Note    06/21/2022 7:24 AM 5 Days Post-Op  Subjective:  sleeping, wakes easily.  Says his arm is better  Afebrile   Vitals:   06/20/22 2336 06/21/22 0436  BP: 100/71 98/71  Pulse: 93 95  Resp: 13 11  Temp: 98.4 F (36.9 C) 98.5 F (36.9 C)  SpO2: 90% 91%    Physical Exam: General:  sleeping; wakes easily; no distress Cardiac:  regular Lungs:  non labored Incisions:  clean and dry; healing nicely Extremities:  palpable left radial pulse; motor and sensory in tact.  Ecchymosis left arm and is much softer than last week.   CBC    Component Value Date/Time   WBC 12.1 (H) 06/21/2022 0146   RBC 3.80 (L) 06/21/2022 0146   HGB 9.5 (L) 06/21/2022 0146   HGB 11.8 (L) 05/05/2022 1230   HCT 31.6 (L) 06/21/2022 0146   HCT 37.2 (L) 05/05/2022 1230   PLT 72 (L) 06/21/2022 0146   PLT 210 05/05/2022 1230   MCV 83.2 06/21/2022 0146   MCV 82 05/05/2022 1230   MCH 25.0 (L) 06/21/2022 0146   MCHC 30.1 06/21/2022 0146   RDW 21.7 (H) 06/21/2022 0146   RDW 14.5 05/05/2022 1230   LYMPHSABS 0.9 06/21/2022 0146   LYMPHSABS 0.8 05/05/2022 1230   MONOABS 0.8 06/21/2022 0146   EOSABS 0.2 06/21/2022 0146   EOSABS 0.0 05/05/2022 1230   BASOSABS 0.0 06/21/2022 0146   BASOSABS 0.0 05/05/2022 1230    BMET    Component Value Date/Time   NA 130 (L) 06/21/2022 0146   NA 137 05/05/2022 1230   K 3.6 06/21/2022 0146   CL 104 06/21/2022 0146   CO2 22 06/21/2022 0146   GLUCOSE 107 (H) 06/21/2022 0146   BUN 12 06/21/2022 0146   BUN 13 05/05/2022 1230   CREATININE 0.97 06/21/2022 0146   CREATININE 0.92 12/08/2015 0848   CALCIUM 7.7 (L) 06/21/2022 0146   GFRNONAA >60 06/21/2022 0146   GFRAA >60 04/02/2019 0901    INR    Component Value Date/Time   INR 1.6 (H) 06/15/2022 2242     Intake/Output Summary (Last 24 hours) at 06/21/2022 0724 Last data filed at 06/21/2022 0300 Gross per 24 hour  Intake 1581.99 ml  Output 650 ml  Net 931.99 ml      Assessment/Plan:  72  y.o. male is s/p:   L arm thrombectomy   5 Days Post-Op   -pt's left arm hematoma much softer and continues to have palpable left radial pulse.  Motor and sensory in tact and incision is healing nicely. -endocarditis with AV and MV vegetations.  CTS recommending against surgery.    Doreatha Massed, PA-C [facility] [phone] 06/21/2022 7:24 AM  I have independently interviewed and examined patient and agree with PA assessment and plan above.  Vascular surgery available as needed.  Shelly Spenser C. Randie Heinz, MD [facility] [place] Office: [phone] Pager: [phone]

## 2022-06-21 NOTE — Progress Notes (Signed)
Peripherally Inserted Central Catheter Placement  The IV Nurse has discussed with the patient and/or persons authorized to consent for the patient, the purpose of this procedure and the potential benefits and risks involved with this procedure.  The benefits include less needle sticks, lab draws from the catheter, and the patient may be discharged home with the catheter. Risks include, but not limited to, infection, bleeding, blood clot (thrombus formation), and puncture of an artery; nerve damage and irregular heartbeat and possibility to perform a PICC exchange if needed/ordered by physician.  Alternatives to this procedure were also discussed.  Bard Power PICC patient education guide, fact sheet on infection prevention and patient information card has been provided to patient /or left at bedside.    PICC Placement Documentation  PICC Double Lumen 06/21/22 Right Brachial 38 cm 1 cm (Active)  Indication for Insertion or Continuance of Line Home intravenous therapies (PICC only);Prolonged intravenous therapies 06/21/22 1810  Exposed Catheter (cm) 1 cm 06/21/22 1810  Site Assessment Clean, Dry, Intact 06/21/22 1810  Lumen #1 Status Flushed;Saline locked;Blood return noted 06/21/22 1810  Lumen #2 Status Flushed;Saline locked;Blood return noted 06/21/22 1810  Dressing Type Transparent;Securing device 06/21/22 1810  Dressing Status Antimicrobial disc in place;Clean, Dry, Intact 06/21/22 1810  Safety Lock Not Applicable 06/21/22 1810  Line Care Connections checked and tightened 06/21/22 1810  Line Adjustment (NICU/IV Team Only) No 06/21/22 1810  Dressing Intervention New dressing 06/21/22 1810  Dressing Change Due 06/28/22 06/21/22 1810       Advith Martine, Lajean Manes 06/21/2022, 6:14 PM

## 2022-06-21 NOTE — Progress Notes (Signed)
Occupational Therapy Treatment Patient Details Name: [name] MRN: [id number] DOB: 1950-05-30 Today's Date: 06/21/2022   History of present illness 72 yo admitted on 5/7 for code stroke secondary to numbness and heaviness in L UE. CT/CTA of the head and neck which were negative.  CTA of the chest does reveal a high right brachial arterial occlusion. S/p 5/7 L UE thromboembolectomy and angiogram with catheter selection of left subclavian and axillary arteries s/p5/8 TEE with concern for MV vegetation. PMHx:  NSTEMI 4/24, anemia, pacemaker, PFO closure, LAA clip, complete heart block, a-flutter, HLD, prostate CA,   OT comments  Pt continuing to progress towards pt related goals, Pt LUE still bruised but has WFL AROM. Pt now able to complete lower body dressing and ambulation with Supervision, mainly presents with decreased activity tolerance following functional mobility. Educated pt on energy conservation and pursed lip breathing to moderate decreased endurance. OT to continue to progress pt as able while in acute setting and reinforce energy conservation. DC plans remain appropriate for [facility] services.   Recommendations for follow up therapy are one component of a multi-disciplinary discharge planning process, led by the attending physician.  Recommendations may be updated based on patient status, additional functional criteria and insurance authorization.    Assistance Recommended at Discharge Intermittent Supervision/Assistance  Patient can return home with the following  Assist for transportation;Assistance with cooking/housework   Equipment Recommendations  None recommended by OT    Recommendations for Other Services      Precautions / Restrictions Precautions Precautions: Fall Precaution Comments: monitor HR and O2 Restrictions Weight Bearing Restrictions: No LUE Weight Bearing: Non weight bearing Other Position/Activity Restrictions: No formal orders that restrict ROM or WB.   Advised to move to tolerance: gentle ROM throughout the day.       Mobility Bed Mobility Overal bed mobility: Needs Assistance Bed Mobility: Supine to Sit, Rolling Rolling: Supervision   Supine to sit: Supervision          Transfers Overall transfer level: Needs assistance Equipment used: Rolling walker (2 wheels) Transfers: Sit to/from Stand Sit to Stand: Min guard                 Balance Overall balance assessment: Needs assistance Sitting-balance support: Feet supported Sitting balance-Leahy Scale: Good     Standing balance support: Bilateral upper extremity supported, During functional activity Standing balance-Leahy Scale: Fair Standing balance comment: Static standing and performing LUE AROM without RW support                           ADL either performed or assessed with clinical judgement   ADL Overall ADL's : Needs assistance/impaired     Grooming: Wash/dry hands;Standing;Supervision/safety               Lower Body Dressing: Sitting/lateral leans;Supervision/safety;Set up Lower Body Dressing Details (indicate cue type and reason): Pt donned bilat socks sitting EOB Toilet Transfer: Ambulation;Rolling walker (2 wheels);Supervision/safety   Toileting- Architect and Hygiene: Sitting/lateral lean;Modified independent Toileting - Clothing Manipulation Details (indicate cue type and reason): seated rear pericaree     Functional mobility during ADLs: Supervision/safety;Rolling walker (2 wheels)      Extremity/Trunk Assessment Upper Extremity Assessment LUE Deficits / Details: swelling and bruising still present. LUE with FULL AROM            Vision       Perception     Praxis      Cognition  Arousal/Alertness: Awake/alert Behavior During Therapy: WFL for tasks assessed/performed Overall Cognitive Status: Within Functional Limits for tasks assessed                                           Exercises      Shoulder Instructions       General Comments VSS on RA, HR 126-128 during functional ambulation    Pertinent Vitals/ Pain       Pain Assessment Pain Assessment: No/denies pain  Home Living                                          Prior Functioning/Environment              Frequency  Min 2X/week        Progress Toward Goals  OT Goals(current goals can now be found in the care plan section)  Progress towards OT goals: Progressing toward goals  Acute Rehab OT Goals Patient Stated Goal: get stronger again OT Goal Formulation: With patient Time For Goal Achievement: 07/02/22 Potential to Achieve Goals: Good  Plan Discharge plan remains appropriate;Frequency remains appropriate    Co-evaluation                 AM-PAC OT "6 Clicks" Daily Activity     Outcome Measure   Help from another person eating meals?: None Help from another person taking care of personal grooming?: None Help from another person toileting, which includes using toliet, bedpan, or urinal?: A Little Help from another person bathing (including washing, rinsing, drying)?: A Little Help from another person to put on and taking off regular upper body clothing?: A Little Help from another person to put on and taking off regular lower body clothing?: A Little 6 Click Score: 20    End of Session Equipment Utilized During Treatment: Rolling walker (2 wheels)  OT Visit Diagnosis: Unsteadiness on feet (R26.81);Muscle weakness (generalized) (M62.81)   Activity Tolerance Patient tolerated treatment well   Patient Left with call bell/phone within reach;with family/visitor present;in bed   Nurse Communication Mobility status        Time: 1610-9604 OT Time Calculation (min): 16 min  Charges: OT General Charges $OT Visit: 1 Visit OT Treatments $Therapeutic Activity: 8-22 mins  06/21/2022  AB, OTR/L  Acute Rehabilitation Services  Office:  [phone]   Tristan Schroeder 06/21/2022, 4:27 PM

## 2022-06-21 NOTE — Progress Notes (Signed)
[facility]  Date of Admission:  06/15/2022   Total days of inpatient antibiotics 4  Principal Problem:   Ischemia of left upper extremity Active Problems:   H/O aortic valve replacement   History of mitral valve replacement   Brachial artery occlusion, left (HCC)   Paroxysmal A-fib (HCC)   Endocarditis   Bacteremia due to Enterococcus   Acute CVA (cerebrovascular accident) (HCC)   Chronic diastolic heart failure (HCC)          Assessment: 72 year old male with bioprosthetic AVR and by bioprosthetic MVR admitted with acute thrombus to left lower extremity requiring urgent surgery found to have E faecalis bacteremia:     #E faecalis bacteremia with 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  He had a crown done on Monday.  He reports that he noticed when he woke up on Tuesday his left arm felt swollen almost like he slept on it. - Patient underwent left upper extremity thrombectomy via brachial radial artery and angiogram on 5/7 with Dr. Randie Heinz, vascular surgery. Vascular surgery following - Cardiology engaged for TEE which showed large vegetation encompassing majority of mitral valve leaflet, aortic bovine valve with small subcentimeter vegetation.  - Given a faecalis and blood cultures and emboli to the left arm this is consistent with endocarditis.  Patient has not been feeling well since February his clinical picture is consistent with subacute endocarditis which is seen with a faecalis.  Of note, patient developed left arm emboli after chronic, I wonder if that was the inciting event to worsen his clinical course. -Seen by CTS Dr. Cliffton Asters, noted that given  all his comorbidities the likelihood of subsequent surgical transfer limited.  Recommended medical management and antibiotic therapy at this point. Recommendations:  -Continue ampicillin and ceftriaxone for IE coverage x 6 weeks from negative Cx on 06/18/22, then suppressive amox 500 mg PO bid after that for lifelong - Repeat blood Clear x72h-> place double lumen PICC - TEE read pending, reached out to Cards. Cards plan on repeat echo in 2 weeks.  - Colonoscopy can be done outpatient given E faecalis. Last colonoscopy on 03/20/08, noted 2 polyps found -  Follow up with ID following discharge   OPAT ORDERS:  Diagnosis: E faecalis PVE  Culture Result: Blood Cx+ E faecalis  No Known Allergies   Discharge antibiotics to be given via PICC line:  Per pharmacy protocol: Ampicillin 12 gm q24h  continous infusion and ctx 2 gm q12h  Duration: 6 weeks End Date:   [facility] Care Per Protocol with [employer] Use: Home health RN for IV administration and teaching, line care and labs.    Labs weekly while on IV antibiotics: _x_ CBC with differential __ BMP **TWICE WEEKLY ON VANCOMYCIN  _x_ CMP __x CRP x__ ESR __ Vancomycin trough TWICE WEEKLY __ CK  __ Please pull PIC at completion of IV antibiotics _x_ Please leave PIC in place until doctor has seen patient or been notified  Fax weekly labs to [phone]  Clinic Follow Up Appt: 6/3  @ [facility] with Dr. Thedore Mins     Microbiology:   Antibiotics: Cefazolin 5/7 Vanc 5/08 CTX 5/08- AMP 5/9-   Cultures: Blood 5/8 2/2 E faecalis  5/10 NG x 3 d   SUBJECTIVE: Resting in bed. No new complaints Interval: Afebrile overnight, wbc 12.1k  Review of Systems: Review of Systems  All other systems reviewed and are negative.    Scheduled Meds:  apixaban  5 mg Oral BID   atorvastatin  20 mg Oral Daily   digoxin  0.125 mg Oral Daily   docusate sodium  100 mg Oral BID   empagliflozin  10 mg Oral QAC breakfast   furosemide  40 mg Oral  Daily   metoprolol tartrate  37.5 mg Oral BID   pantoprazole  40 mg Oral Daily   sodium chloride flush  3 mL Intravenous Once   sodium chloride flush  3 mL Intravenous Q12H   Continuous Infusions:  sodium chloride     sodium chloride Stopped (06/16/22 1826)   ampicillin (OMNIPEN) IV 2 g (06/21/22 1244)   cefTRIAXone (ROCEPHIN)  IV 2 g (06/21/22 1128)   PRN Meds:.sodium chloride, sodium chloride, acetaminophen **OR** acetaminophen, alum & mag hydroxide-simeth, bisacodyl, guaiFENesin-dextromethorphan, metoprolol tartrate, morphine injection, ondansetron, phenol, senna-docusate, sodium chloride flush, traMADol No Known Allergies  OBJECTIVE: Vitals:   06/21/22 0436 06/21/22 0804 06/21/22 1130 06/21/22 1300  BP: 98/71 108/72 100/74 103/68  Pulse: 95 99  (!) 101  Resp: 11 20  19   Temp: 98.5 F (36.9 C) 97.9 F (36.6 C)  97.7 F (36.5 C)  TempSrc: Oral Oral  Oral  SpO2: 91% 94%  97%  Weight:      Height:       Body mass index is 32.73 kg/m.  Physical Exam Constitutional:      General: He is not in acute distress.    Appearance: He is normal weight. He is not toxic-appearing.  HENT:     Head: Normocephalic and atraumatic.     Right Ear: External ear normal.     Left Ear: External ear normal.     Nose: No congestion or rhinorrhea.     Mouth/Throat:     Mouth: Mucous membranes are moist.     Pharynx: Oropharynx is clear.  Eyes:     Extraocular Movements: Extraocular movements intact.     Conjunctiva/sclera: Conjunctivae normal.     Pupils: Pupils are equal, round, and reactive to light.  Cardiovascular:     Rate and Rhythm: Normal rate and regular rhythm.     Heart sounds: No murmur heard.    No friction rub. No gallop.  Pulmonary:     Effort: Pulmonary effort is normal.     Breath sounds: Normal breath sounds.  Abdominal:     General: Abdomen is flat. Bowel sounds are normal.     Palpations: Abdomen is soft.  Musculoskeletal:        General: No swelling.      Cervical back: Normal range of motion and neck supple.     Comments: Left fore-arm eythema  Skin:    General: Skin is warm and dry.  Neurological:     General: No focal deficit present.     Mental Status: He is oriented to person, place, and time.  Psychiatric:        Mood and Affect: Mood normal.       Lab Results Lab Results  Component Value Date   WBC 12.1 (H) 06/21/2022   HGB 9.5 (L) 06/21/2022   HCT 31.6 (L) 06/21/2022   MCV 83.2 06/21/2022   PLT 72 (L) 06/21/2022    Lab Results  Component Value Date   CREATININE  0.97 06/21/2022   BUN 12 06/21/2022   NA 130 (L) 06/21/2022   K 3.6 06/21/2022   CL 104 06/21/2022   CO2 22 06/21/2022    Lab Results  Component Value Date   ALT 14 06/15/2022   AST 25 06/15/2022   ALKPHOS 74 06/15/2022   BILITOT 1.7 (H) 06/15/2022        Danelle Earthly, MD [facility] 06/21/2022, 2:26 PM   I have personally spent 54 minutes involved in face-to-face and non-face-to-face activities for this patient on the day of the visit. Professional time spent includes the following activities: Preparing to see the patient (review of tests), Obtaining and/or reviewing separately obtained history (admission/discharge record), Performing a medically appropriate examination and/or evaluation , Ordering medications/tests/procedures, referring and communicating with other health care professionals, Documenting clinical information in the EMR, Independently interpreting results (not separately reported), Communicating results to the patient/family/caregiver, Counseling and educating the patient/family/caregiver and Care coordination (not separately reported).

## 2022-06-21 NOTE — Consult Note (Signed)
Cardiology Consultation   Patient ID: JARQUEZ MCKNIGHT MRN: [id number]; DOB: 08-27-1950  Admit date: 06/15/2022 Date of Consult: 06/21/2022  PCP:  [name], MD   [facility] Providers Cardiologist:  Verne Carrow, MD   {    Patient Profile:   [name] is a 72 y.o. male with a hx of CAD/VHD/PFO (CABG 2020, bioprosthetic AVR, bioprosthetic MVR, PFO closure, LAA clipping 10/2018), CHB w/PPM, AFlutter  who is being seen 06/21/2022 for the evaluation of PPM in setting of bacteremia/endocarditis at the request of Dr. Bjorn Pippin.  Device information MDT dual chamber PPM implanted 10/17/2018 Azure, 3830, 5076 all implanted same day  History of Present Illness:   Mr. Werthman was recently admitted with progressive malaise, weakness, some SOB .  W/u included cardiology w/u for mild abn Trops, had cath with patent grafts, and echo with LVEF 55-60%, severe TR, severely dilated RV , mildly hypokinetic,  planned for outpt AHF referral.  Discharged 05/26/22.  Planned to undergo TEE to revisit his valves, had a early follow up again in HF clinic with worsening functional capacity, falls , some confusion  Admitted 06/15/22 w/acute L hand numbness, pain, decreased sensation, perhaps some slurred speech CT/CTA of the head and neck wre done which were negative,  vascular consulted for embolic event with US showing an acute occlusion of the high left proximal brachial artery at the level of the left humeral neck. Brought emergently to the OR underwent Left upper extremity thromboembolectomy via brachial and radial artery approach   Brain MRI noted 06/16/22 IMPRESSION: Scattered punctate acute to subacute bilateral cerebral infarcts primarily involving white matter.  TEE 06/16/22  Left ventricle: normal function. Limited visualization of the LV apex, however, no obvious thrombus noted.  Right ventricle: Moderate to severely dilated with moderately reduced function.  Mitral valve: 27mm  Medtronic bioprosthetic valve in the mitral position. There is a large vegetation encompassing a majority of the valve leaflets. There is mild mitral stenosis. The valve is otherwise well seated with no regurgitation.  Aortic valve: 23mm Edwards bovine valve present (10/11/18). The aortic valve leaflets appear thickened with a small subcentimeter vegetation seen on limited views at the aortic aspect of the valve. The valve is otherwise well seated with no paravalvular or valvular regurgitation. No signs of aortic root abscess or pathology at the aortomitral curtain.  ID brought on board with [facility] noting E faecalis bacteremia   Dr. Cliffton Asters saw the patient, 06/17/22, and given his comorbidities, the likelihood of a successful surgical outcome is very limited. Would recommend medical management, and antibiotic therapy at this point.  Repeat echo +/- 2 weeks  EP is asked on board given his PPM.  06/16/22: BC 2/2 + ENTEROCOCCUS FAECALIS   K+ 3.6 BUN/Creat 12/0.97 Mag 2.3 WBC 12.1 (generally down trending from 18) H/H 9.5/31 Plts 72 (since his admission, has been thrombocytopenic, though not previously)   Generally feeling weak, tired, L arm pain is much improved, denies any speech difficulty, or neuro changes. Reports he has not felt well really since Feb No CP, palpitations +DOE, no rest SOB  VSS, on RA  Past Medical History:  Diagnosis Date   BCE (basal cell epithelioma), arm    RIGHT SHOULDER   Bradycardia    Bumps on skin    on left side of nose for last 3 weeks   CAD (coronary artery disease)    a. s/p CABGx2 10/11/18 LIMA to LAD, SVG to RCA, EVH via right thigh.  Dyslipidemia    Ectatic thoracic aorta (HCC)    Esophageal stricture    GERD (gastroesophageal reflux disease)    Heart murmur    sees dr Rose Fillers   Hyperlipidemia    Incidental pulmonary nodule, > 3mm and < 8mm 10/04/2018   Noted on CTA   Inguinal hernia    bilateral   Obesity    Presence of permanent cardiac  pacemaker    Prostate cancer (HCC)    S/P aortic valve replacement with bioprosthetic valve 10/11/2018   23 mm Edwards Inspiris Resilia stented bovine pericardial tissue valve, miltral valve done also   S/P CABG x 2 10/11/2018   LIMA to LAD, SVG to RCA, EVH via right thigh   S/P mitral valve replacement with bioprosthetic valve 10/11/2018   27 mm Medtronic Mosaic stented porcine bioprosthetic tissue valve   S/P patent foramen ovale closure 10/11/2018   S/P placement of cardiac pacemaker    Smoker     Past Surgical History:  Procedure Laterality Date   AORTIC VALVE REPLACEMENT N/A 10/11/2018   Procedure: AORTIC VALVE REPLACEMENT (AVR) with 23 Inspiris Bioprosthetic Aortic valve.;  Surgeon: Purcell Nails, MD;  Location: [facility] OR;  Service: Open Heart Surgery;  Laterality: N/A;   AV FISTULA PLACEMENT Left 06/15/2022   Procedure: LEFT UPPER EXTREMITY THROMBECTOMY;  Surgeon: Maeola Harman, MD;  Location: [facility] OR;  Service: Vascular;  Laterality: Left;   CLIPPING OF ATRIAL APPENDAGE N/A 10/11/2018   Procedure: CLIPPING OF LEFT ATRIAL APPENDAGE with 45 AtriCure Clip.;  Surgeon: Purcell Nails, MD;  Location: [facility] OR;  Service: Open Heart Surgery;  Laterality: N/A;   COLONOSCOPY  03/2008,2004   Dr. Madilyn Fireman   CORONARY ARTERY BYPASS GRAFT N/A 10/11/2018   Procedure: CORONARY ARTERY BYPASS GRAFTING (CABG) x 2 using LIMA to the LAD and endoscopic greater saphenous vein harvesting to the RCA.;  Surgeon: Purcell Nails, MD;  Location: [facility] OR;  Service: Open Heart Surgery;  Laterality: N/A;   CYSTOSCOPY  04/04/2019   Procedure: CYSTOSCOPY;  Surgeon: Rene Paci, MD;  Location: [facility];  Service: Urology;;   HERNIA REPAIR  12/11/10   BIH   MITRAL VALVE REPLACEMENT N/A 10/11/2018   Procedure: MITRAL VALVE (MV) REPLACEMENT with 27 Mosaic Bioprosthetic Mitral valve.;  Surgeon: Purcell Nails, MD;  Location: [facility] OR;  Service: Open Heart Surgery;  Laterality: N/A;   PACEMAKER  IMPLANT N/A 10/17/2018   Procedure: PACEMAKER IMPLANT;  Surgeon: Hillis Range, MD;  Location: [facility] INVASIVE [facility] LAB;  Service: Cardiovascular;  Laterality: N/A;   RADIOACTIVE SEED IMPLANT N/A 04/04/2019   Procedure: RADIOACTIVE SEED IMPLANT/BRACHYTHERAPY IMPLANT, cystoscopy;  Surgeon: Rene Paci, MD;  Location: [facility];  Service: Urology;  Laterality: N/A;  58 seeds   REPAIR OF PATENT FORAMEN OVALE N/A 10/11/2018   Procedure: CLOSURE OF PATENT FORAMEN OVALE;  Surgeon: Purcell Nails, MD;  Location: [facility] OR;  Service: Open Heart Surgery;  Laterality: N/A;   RIGHT/LEFT HEART CATH AND CORONARY ANGIOGRAPHY N/A 09/15/2018   Procedure: RIGHT/LEFT HEART CATH AND CORONARY ANGIOGRAPHY;  Surgeon: Kathleene Hazel, MD;  Location: [facility] INVASIVE CV LAB;  Service: Cardiovascular;  Laterality: N/A;   RIGHT/LEFT HEART CATH AND CORONARY/GRAFT ANGIOGRAPHY N/A 05/25/2022   Procedure: RIGHT/LEFT HEART CATH AND CORONARY/GRAFT ANGIOGRAPHY;  Surgeon: Kathleene Hazel, MD;  Location: [facility] INVAS[facility] LAB;  Service: Cardiovascular;  Laterality: N/A;   TEE WITHOUT CARDIOVERSION N/A 12/12/2015   Procedure: TRANSESOPHAGEAL ECHOCARDIOGRAM (TEE);  Surgeon: Rachelle Hora  Croitoru, MD;  Location: [facility] ENDOSCOPY;  Service: Cardiovascular;  Laterality: N/A;   TEE WITHOUT CARDIOVERSION N/A 10/11/2018   Procedure: TRANSESOPHAGEAL ECHOCARDIOGRAM (TEE);  Surgeon: Purcell Nails, MD;  Location: [facility] OR;  Service: Open Heart Surgery;  Laterality: N/A;   TEE WITHOUT CARDIOVERSION N/A 06/16/2022   Procedure: TRANSESOPHAGEAL ECHOCARDIOGRAM;  Surgeon: Dorthula Nettles, DO;  Location: [facility] INVASIVE CV LAB;  Service: Cardiovascular;  Laterality: N/A;   THYMECTOMY  1976   radaiation tx done     Home Medications:  Prior to Admission medications   Medication Sig Start Date End Date Taking? Authorizing Provider  acetaminophen (TYLENOL) 325 MG tablet Take 650 mg by mouth as needed for moderate pain.   Yes [provider]  apixaban (ELIQUIS) 5 MG TABS tablet Take 1 tablet (5 mg total) by mouth 2 (two) times daily. 05/26/22  Yes Kathlen Mody, MD  atorvastatin (LIPITOR) 20 MG tablet Take 1 tablet (20 mg total) by mouth daily. 05/18/22  Yes [name], MD  digoxin (LANOXIN) 0.125 MG tablet Take 1 tablet (0.125 mg total) by mouth daily. 06/03/22  Yes Sabharwal, Aditya, DO  empagliflozin (JARDIANCE) 10 MG TABS tablet Take 1 tablet (10 mg total) by mouth daily before breakfast. 06/03/22  Yes Sabharwal, Aditya, DO  furosemide (LASIX) 20 MG tablet Take 20 mg by mouth 2 (two) times daily.   Yes [provider]  Iron, Ferrous Sulfate, 325 (65 Fe) MG TABS Take 1 tablet by mouth 2 (two) times daily. Patient taking differently: Take 1 tablet by mouth daily. 05/26/22  Yes Kathlen Mody, MD  metoprolol tartrate (LOPRESSOR) 25 MG tablet Take 1.5 tablets (37.5 mg total) by mouth 2 (two) times daily. 06/09/22  Yes Milford, Anderson Malta, FNP  oxymetazoline (AFRIN) 0.05 % nasal spray Place 1 spray into both nostrils at bedtime as needed for congestion.   Yes [provider]  polyethylene glycol powder (GLYCOLAX/MIRALAX) 17 GM/SCOOP powder Take 17 g by mouth daily as needed for mild constipation. 05/26/22  Yes Kathlen Mody, MD    Inpatient Medications: Scheduled Meds:  apixaban  5 mg Oral BID   atorvastatin  20 mg Oral Daily   digoxin  0.125 mg Oral Daily   docusate sodium  100 mg Oral BID   empagliflozin  10 mg Oral QAC breakfast   furosemide  40 mg Oral Daily   metoprolol tartrate  37.5 mg Oral BID   pantoprazole  40 mg Oral Daily   sodium chloride flush  3 mL Intravenous Once   sodium chloride flush  3 mL Intravenous Q12H   Continuous Infusions:  sodium chloride     sodium chloride Stopped (06/16/22 1826)   ampicillin (OMNIPEN) IV 2 g (06/21/22 0846)   cefTRIAXone (ROCEPHIN)  IV 2 g (06/21/22 1128)   PRN Meds: sodium chloride, sodium chloride, acetaminophen **OR** acetaminophen, alum & mag  hydroxide-simeth, bisacodyl, guaiFENesin-dextromethorphan, metoprolol tartrate, morphine injection, ondansetron, phenol, senna-docusate, sodium chloride flush, traMADol  Allergies:   No Known Allergies  Social History:   Social History   Socioeconomic History   Marital status: Married    Spouse name: Not on file   Number of children: 0   Years of education: Not on file   Highest education level: Not on file  Occupational History   Occupation: Market researcher: [employer]  Tobacco Use   Smoking status: Former    Packs/day: 0.25    Years: 35.00    Additional pack years: 0.00    Total  pack years: 8.75    Types: Cigarettes    Quit date: 09/04/2018    Years since quitting: 3.7   Smokeless tobacco: Never  Vaping Use   Vaping Use: Never used  Substance and Sexual Activity   Alcohol use: Yes    Alcohol/week: 1.0 standard drink of alcohol    Types: 1 Cans of beer per week    Comment: rare   Drug use: No   Sexual activity: Yes  Other Topics Concern   Not on file  Social History Narrative   Not on file   Social Determinants of Health   Financial Resource Strain: Low Risk  (05/11/2022)   Overall Financial Resource Strain (CARDIA)    Difficulty of Paying Living Expenses: Not hard at all  Food Insecurity: No Food Insecurity (06/16/2022)   Hunger Vital Sign    Worried About Running Out of Food in the Last Year: Never true    Ran Out of Food in the Last Year: Never true  Transportation Needs: No Transportation Needs (06/16/2022)   PRAPARE - Administrator, Civil Service (Medical): No    Lack of Transportation (Non-Medical): No  Physical Activity: Inactive (05/11/2022)   Exercise Vital Sign    Days of Exercise per Week: 0 days    Minutes of Exercise per Session: 0 min  Stress: No Stress Concern Present (05/11/2022)   [employer] of Occupational Health - Occupational Stress Questionnaire    Feeling of Stress : Not at all  Social Connections: Not on file   Intimate Partner Violence: Not At Risk (06/16/2022)   Humiliation, Afraid, Rape, and Kick questionnaire    Fear of Current or Ex-Partner: No    Emotionally Abused: No    Physically Abused: No    Sexually Abused: No    Family History:   Family History  Problem Relation Age of Onset   Colon cancer Mother    Lung cancer Father        smoker   CAD Neg Hx    Pancreatic cancer Neg Hx    Breast cancer Neg Hx      ROS:  Please see the history of present illness.  All other ROS reviewed and negative.     Physical Exam/Data:   Vitals:   06/20/22 2336 06/21/22 0436 06/21/22 0804 06/21/22 1130  BP: 100/71 98/71 108/72 100/74  Pulse: 93 95 99   Resp: 13 11 20    Temp: 98.4 F (36.9 C) 98.5 F (36.9 C) 97.9 F (36.6 C)   TempSrc: Oral Oral Oral   SpO2: 90% 91% 94%   Weight:      Height:        Intake/Output Summary (Last 24 hours) at 06/21/2022 1221 Last data filed at 06/21/2022 1200 Gross per 24 hour  Intake 1120 ml  Output 1950 ml  Net -830 ml      06/15/2022    9:19 AM 06/09/2022    3:09 PM 06/03/2022   11:46 AM  Last 3 Weights  Weight (lbs) 209 lb 209 lb 212 lb  Weight (kg) 94.802 kg 94.802 kg 96.163 kg     Body mass index is 32.73 kg/m.  General:  Well nourished, well developed, in no acute distress HEENT: normal Neck: no JVD Vascular: No carotid bruits; Distal pulses 2+ bilaterally Cardiac:  RRR; 2/6 SM Lungs:  CTA b/l, no wheezing, rhonchi or rales  Abd: soft, nontender Ext: no edema Musculoskeletal:  No deformities,LUE swelling/diffuse ecchymosis Skin: warm and dry  Neuro:  no gross focal motor abnormalities noted Psych:  Normal affect   EKG:  The EKG was personally reviewed and demonstrates:    ST/V pacing at 117bpm,  Telemetry:  Telemetry was personally reviewed and demonstrates:  SR, V pacing, some AV pacing, occ PVCs  Relevant CV Studies:   Cath: 05/25/2022   Prox LAD lesion is 40% stenosed.   Mid LAD lesion is 95% stenosed.   Prox Cx to Mid Cx  lesion is 30% stenosed.   1st Mrg lesion is 30% stenosed.   3rd Mrg lesion is 30% stenosed.   Prox RCA lesion is 100% stenosed.   SVG graft was visualized by angiography and is normal in caliber.   LIMA graft was visualized by angiography and is normal in caliber.   Severe mid LAD stenosis. Competitive filling is seen distally from the patent LIMA graft.  Patent LIMA graft to the mid LAD.  Mild non-obstructive disease in the Circumflex artery.  Large dominant RCA with chronic proximal occlusion. The mid and distal RCA fills from the patent vein graft.  Near normal right and left heart pressures.  CO 6.92 L/min CI 3.33   Recommendations: His symptoms are likely related to his severe TR. He does not appear to need further diuresis. Medical management of CAD. Consider Advanced Heart Failure team consult.   Echo: 05/23/2022 IMPRESSIONS   1. Right ventricular systolic function is moderately reduced. The right  ventricular size is severely enlarged.   2. Left ventricular ejection fraction, by estimation, is 55 to 60%. The  left ventricle has normal function. The left ventricle has no regional  wall motion abnormalities. Left ventricular diastolic parameters are  indeterminate. There is the  interventricular septum is flattened in systole, consistent with right  ventricular pressure overload and the interventricular septum is flattened  in diastole ('D' shaped left ventricle), consistent with right ventricular  volume overload.   3. The mitral valve has been repaired/replaced. Trivial mitral valve  regurgitation. 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   4. Tricuspid valve regurgitation is severe.   5. The aortic valve has been repaired/replaced. Aortic valve  regurgitation is not visualized. Echo findings are consistent with normal  structure and function of the aortic valve prosthesis. Aortic valve mean  gradient measures  7.0 mmHg. Aortic valve Vmax   measures 1.78 m/s. DI 0.5.   6. Left atrial size was severely dilated.   7. Right atrial size was severely dilated.   Comparison(s): Compared to prior TTE in 06/2019, the RV is now severely  dilated and moderately hypokinetic with severe TR.     Echo 06/13/19:  1. Left ventricular ejection fraction, by estimation, is 60 to 65%. The  left ventricle has normal function. The left ventricle has no regional  wall motion abnormalities. There is mild concentric left ventricular  hypertrophy. Left ventricular diastolic  function could not be evaluated.   2. Right ventricular systolic function is moderately reduced. The right  ventricular size is moderately enlarged. There is normal pulmonary artery  systolic pressure.   3. Left atrial size was mildly dilated.   4. Right atrial size was mildly dilated.   5. The mitral valve has been repaired/replaced. No evidence of mitral  valve regurgitation. There is a 27 mm Medtronic bioprosthetic valve  present in the mitral position. Procedure Date: 10/11/2018. Echo findings  are consistent with normal structure and  function of the mitral  valve prosthesis.   6. The tricuspid valve is abnormal. Tricuspid valve regurgitation is  moderate.   7. The aortic valve has been repaired/replaced. Aortic valve  regurgitation is not visualized. There is a 23 mm Edwards bovine valve  present in the aortic position. Procedure Date: 10/11/2018. Echo findings  are consistent with normal structure and function   of the aortic valve prosthesis.   8. The inferior vena cava is normal in size with <50% respiratory  variability, suggesting right atrial pressure of 8 mmHg.      09/15/2018: [facility] Prox RCA lesion is 100% stenosed. Prox Cx to Mid Cx lesion is 30% stenosed. 1st Mrg lesion is 30% stenosed. 3rd Mrg lesion is 30% stenosed. Prox LAD lesion is 40% stenosed. Mid LAD lesion is 95% stenosed.   1. Severe stenosis mid LAD 2. Mild  non-obstructive disease in the LAD 3. Chronic occlusion of the proximal RCA. Filling of the proximal, mid and distal RCA from left to right collaterals 4. Severe aortic stenosis (mean gradient 34.1 mmHg, peak to peak gradient 32 mmHg, AVA 1.1 cm2) 5. Severe mitral stenosis by echo.     Laboratory Data:  High Sensitivity Troponin:   Recent Labs  Lab 05/23/22 1126 05/23/22 1351 05/24/22 0826 05/24/22 1016  TROPONINIHS 185* 178* 88* 72*     Chemistry Recent Labs  Lab 06/19/22 0123 06/20/22 0119 06/21/22 0146  NA 132* 134* 130*  K 3.1* 4.0 3.6  CL 100 101 104  CO2 22 23 22   GLUCOSE 108* 103* 107*  BUN 16 12 12   CREATININE 1.05 0.91 0.97  CALCIUM 7.9* 8.0* 7.7*  MG 2.2 2.3 2.3  GFRNONAA >60 >60 >60  ANIONGAP 10 10 4*    Recent Labs  Lab 06/15/22 0830 06/15/22 2242  PROT 5.9* 6.3*  ALBUMIN 2.3* 2.6*  AST 26 25  ALT 12 14  ALKPHOS 73 74  BILITOT 2.0* 1.7*   Lipids No results for input(s): "CHOL", "TRIG", "HDL", "LABVLDL", "LDLCALC", "CHOLHDL" in the last 168 hours.  Hematology Recent Labs  Lab 06/19/22 0123 06/20/22 0119 06/21/22 0146  WBC 13.6* 14.2* 12.1*  RBC 3.86* 3.90* 3.80*  HGB 9.8* 10.0* 9.5*  HCT 32.6* 32.8* 31.6*  MCV 84.5 84.1 83.2  MCH 25.4* 25.6* 25.0*  MCHC 30.1 30.5 30.1  RDW 21.0* 21.4* 21.7*  PLT 65* 58* 72*   Thyroid No results for input(s): "TSH", "FREET4" in the last 168 hours.  BNPNo results for input(s): "BNP", "PROBNP" in the last 168 hours.  DDimer No results for input(s): "DDIMER" in the last 168 hours.   Radiology/Studies:  [place] EKG SITE RITE  Result Date: 06/21/2022 If Site Rite image not attached, placement could not be confirmed due to current cardiac rhythm.    Assessment and Plan:   Endocarditis Complicated by septic embolism to LUE and b/l strokes on MRI Vegetations on both prosthetic MVR and AVR Bacteremia E Fecalis PPM No obvious veg on leads  Not felt a surgical candidate, therefor I do not think he will be  a candidate for PPM system removal D/w patient  EP MD will see later today   Risk Assessment/Risk Scores:    For questions or updates, please contact [facility] Please consult [email or web address] for contact info under    Signed, Sheilah Pigeon, PA-C  06/21/2022 12:21 PM

## 2022-06-21 NOTE — Discharge Instructions (Signed)
No restrictions on driving but would recommend to not drive alone out of precaution for at least the next 3-4 weeks if possible.    Information on my medicine - ELIQUIS (apixaban)  This medication education was reviewed with me or my healthcare representative as part of my discharge preparation.  The pharmacist that spoke with me during my hospital stay was:    Why was Eliquis prescribed for you? Eliquis was prescribed for you to reduce the risk of a blood clot forming that can cause a stroke if you have a medical condition called atrial fibrillation (a type of irregular heartbeat).  What do You need to know about Eliquis ? Take your Eliquis TWICE DAILY - one tablet in the morning and one tablet in the evening with or without food. If you have difficulty swallowing the tablet whole please discuss with your pharmacist how to take the medication safely.  Take Eliquis exactly as prescribed by your doctor and DO NOT stop taking Eliquis without talking to the doctor who prescribed the medication.  Stopping may increase your risk of developing a stroke.  Refill your prescription before you run out.  After discharge, you should have regular check-up appointments with your healthcare provider that is prescribing your Eliquis.  In the future your dose may need to be changed if your kidney function or weight changes by a significant amount or as you get older.  What do you do if you miss a dose? If you miss a dose, take it as soon as you remember on the same day and resume taking twice daily.  Do not take more than one dose of ELIQUIS at the same time to make up a missed dose.  Important Safety Information A possible side effect of Eliquis is bleeding. You should call your healthcare provider right away if you experience any of the following: Bleeding from an injury or your nose that does not stop. Unusual colored urine (red or dark brown) or unusual colored stools (red or black). Unusual  bruising for unknown reasons. A serious fall or if you hit your head (even if there is no bleeding).  Some medicines may interact with Eliquis and might increase your risk of bleeding or clotting while on Eliquis. To help avoid this, consult your healthcare provider or pharmacist prior to using any new prescription or non-prescription medications, including herbals, vitamins, non-steroidal anti-inflammatory drugs (NSAIDs) and supplements.  This website has more information on Eliquis (apixaban): http://www.eliquis.com/eliquis/home

## 2022-06-21 NOTE — Progress Notes (Signed)
PHARMACY CONSULT NOTE FOR:  OUTPATIENT  PARENTERAL ANTIBIOTIC THERAPY (OPAT)  Indication: Enterococcal endocarditis Regimen: Ampicillin 12 gm every 24 hours as a continuous infusion + Ceftriaxone 2 gm IV Q 12 hours   End date: 07/30/22  IV antibiotic discharge orders are pended. To discharging provider:  please sign these orders via discharge navigator,  Select New Orders & click on the button choice - Manage This Unsigned Work.     Thank you for allowing pharmacy to be a part of this patient's care.  Sharin Mons, PharmD, BCPS, BCIDP Infectious Diseases Clinical Pharmacist Phone: [phone] 06/21/2022, 11:59 AM

## 2022-06-21 NOTE — Progress Notes (Signed)
Rounding Note    Patient Name: [name] Date of Encounter: 06/21/2022  [facility] Cardiologist: Verne Carrow, MD   Subjective   BP 108/72. Cr 0.97.  Denies any chest pain or dyspnea, reports left arm is improving  Inpatient Medications    Scheduled Meds:  apixaban  5 mg Oral BID   atorvastatin  20 mg Oral Daily   digoxin  0.125 mg Oral Daily   docusate sodium  100 mg Oral BID   empagliflozin  10 mg Oral QAC breakfast   furosemide  40 mg Oral Daily   metoprolol tartrate  37.5 mg Oral BID   pantoprazole  40 mg Oral Daily   sodium chloride flush  3 mL Intravenous Once   sodium chloride flush  3 mL Intravenous Q12H   Continuous Infusions:  sodium chloride     sodium chloride Stopped (06/16/22 1826)   ampicillin (OMNIPEN) IV 2 g (06/21/22 0846)   cefTRIAXone (ROCEPHIN)  IV 2 g (06/20/22 2238)   PRN Meds: sodium chloride, sodium chloride, acetaminophen **OR** acetaminophen, alum & mag hydroxide-simeth, bisacodyl, guaiFENesin-dextromethorphan, metoprolol tartrate, morphine injection, ondansetron, phenol, senna-docusate, sodium chloride flush, traMADol   Vital Signs    Vitals:   06/20/22 2032 06/20/22 2336 06/21/22 0436 06/21/22 0804  BP: 94/70 100/71 98/71 108/72  Pulse: 100 93 95 99  Resp: 16 13 11 20   Temp: 98 F (36.7 C) 98.4 F (36.9 C) 98.5 F (36.9 C) 97.9 F (36.6 C)  TempSrc: Oral Oral Oral Oral  SpO2: 96% 90% 91% 94%  Weight:      Height:        Intake/Output Summary (Last 24 hours) at 06/21/2022 0952 Last data filed at 06/21/2022 0300 Gross per 24 hour  Intake 1200 ml  Output 300 ml  Net 900 ml       06/15/2022    9:19 AM 06/09/2022    3:09 PM 06/03/2022   11:46 AM  Last 3 Weights  Weight (lbs) 209 lb 209 lb 212 lb  Weight (kg) 94.802 kg 94.802 kg 96.163 kg      Telemetry    A-sensed with v-pacing with frequent PVCs - Personally Reviewed  ECG    No new tracing today - Personally Reviewed  Physical Exam   GEN: No  acute distress.   Neck: Mild JVD Cardiac: RRR, 2/6 systolic murmur Respiratory: CTAB GI: Soft, nontender, non-distended  MS: Left arm ecchymosis  Neuro:  Nonfocal  Psych: Normal affect   Labs    High Sensitivity Troponin:   Recent Labs  Lab 05/23/22 1126 05/23/22 1351 05/24/22 0826 05/24/22 1016  TROPONINIHS 185* 178* 88* 72*      Chemistry Recent Labs  Lab 06/15/22 0830 06/15/22 0835 06/15/22 2242 06/16/22 0130 06/19/22 0123 06/20/22 0119 06/21/22 0146  NA 134*   < > 131*   < > 132* 134* 130*  K 3.3*   < > 4.2   < > 3.1* 4.0 3.6  CL 106   < > 101   < > 100 101 104  CO2 14*  --  19*   < > 22 23 22   GLUCOSE 165*   < > 190*   < > 108* 103* 107*  BUN 14   < > 18   < > 16 12 12   CREATININE 1.02   < > 1.08   < > 1.05 0.91 0.97  CALCIUM 7.7*  --  8.3*   < > 7.9* 8.0* 7.7*  MG  --   --   --    < >  2.2 2.3 2.3  PROT 5.9*  --  6.3*  --   --   --   --   ALBUMIN 2.3*  --  2.6*  --   --   --   --   AST 26  --  25  --   --   --   --   ALT 12  --  14  --   --   --   --   ALKPHOS 73  --  74  --   --   --   --   BILITOT 2.0*  --  1.7*  --   --   --   --   GFRNONAA >60  --  >60   < > >60 >60 >60  ANIONGAP 14  --  11   < > 10 10 4*   < > = values in this interval not displayed.     Lipids No results for input(s): "CHOL", "TRIG", "HDL", "LABVLDL", "LDLCALC", "CHOLHDL" in the last 168 hours.  Hematology Recent Labs  Lab 06/19/22 0123 06/20/22 0119 06/21/22 0146  WBC 13.6* 14.2* 12.1*  RBC 3.86* 3.90* 3.80*  HGB 9.8* 10.0* 9.5*  HCT 32.6* 32.8* 31.6*  MCV 84.5 84.1 83.2  MCH 25.4* 25.6* 25.0*  MCHC 30.1 30.5 30.1  RDW 21.0* 21.4* 21.7*  PLT 65* 58* 72*    Thyroid No results for input(s): "TSH", "FREET4" in the last 168 hours.  BNPNo results for input(s): "BNP", "PROBNP" in the last 168 hours.  DDimer No results for input(s): "DDIMER" in the last 168 hours.   Radiology    [place] EKG SITE RITE  Result Date: 06/21/2022 If Site Rite image not attached, placement could  not be confirmed due to current cardiac rhythm.   Cardiac Studies   TEE 05/08 prelim report: KEY FINDINGS:   Left ventricle: normal function. Limited visualization of the LV apex, however, no obvious thrombus noted.  Right ventricle: Moderate to severely dilated with moderately reduced function.  Mitral valve: 27mm Medtronic bioprosthetic valve in the mitral position. There is a large vegetation encompassing a majority of the valve leaflets. There is mild mitral stenosis. The valve is otherwise well seated with no regurgitation.  Aortic valve: 23mm Edwards bovine valve present (10/11/18). The aortic valve leaflets appear thickened with a small subcentimeter vegetation seen on limited views at the aortic aspect of the valve. The valve is otherwise well seated with no paravalvular or valvular regurgitation. No signs of aortic root abscess or pathology at the aortomitral curtain. ECHO: - 05/23/22: Normal LV function, severely dilated RV with moderately reduced function, moderate stenosis of prosthesis (gradient ), severe TR due to poor coaptation of leaflets, s/p AVR as per my personal interpretation - 06/13/19: Normal LV function with septal bowing to the left; dilated RV with mildly reduced function. Mitral bioprosthesis gradient of - 06/27/18: Normal LV function, normal RV function. Severe MS/AS with moderate AI.    CATH: - 05/25/22:   Prox LAD lesion is 40% stenosed.   Mid LAD lesion is 95% stenosed.   Prox Cx to Mid Cx lesion is 30% stenosed.   1st Mrg lesion is 30% stenosed.   3rd Mrg lesion is 30% stenosed.   Prox RCA lesion is 100% stenosed.   SVG graft was visualized by angiography and is normal in caliber.   LIMA graft was visualized by angiography and is normal in caliber.   Severe mid LAD stenosis. Competitive filling is seen distally from the  patent LIMA graft.  Patent LIMA graft to the mid LAD.  Mild non-obstructive disease in the Circumflex artery.  Large dominant RCA  with chronic proximal occlusion. The mid and distal RCA fills from the patent vein graft.  Near normal right and left heart pressures.  CO 6.92 L/min CI 3.33     Diagnostic Dominance: Right  Intervention   - 09/15/2018: Prox RCA lesion is 100% stenosed. Prox Cx to Mid Cx lesion is 30% stenosed. 1st Mrg lesion is 30% stenosed. 3rd Mrg lesion is 30% stenosed. Prox LAD lesion is 40% stenosed. Mid LAD lesion is 95% stenosed.   1. Severe stenosis mid LAD 2. Mild non-obstructive disease in the LAD 3. Chronic occlusion of the proximal RCA. Filling of the proximal, mid and distal RCA from left to right collaterals 4. Severe aortic stenosis (mean gradient 34.1 mmHg, peak to peak gradient 32 mmHg, AVA 1.1 cm2) 5. Severe mitral stenosis by echo.     Patient Profile     72 y.o. male with a hx of CAD with CABG X 2 vessel and L atrial clipping, + foramen ovale closure, s/p aortic and MVR bioprosthetic and PPM, atrial flutter, tobacco abuse, HLD, severe TR with RV dysfunction who presented to the ER with a cold left arm with lack of a pulse found to have acute occlusion of the high left brachial artery now s/p thromboembolectomy. Cardiology was initially consulted for further evaluation for possible cardiac source of embolus. TEE 5/8 with large mitral valve vegetation and subcentimeter AoV vegetation with prelim blood cultures positive for enterococcus concerning for IE.   Assessment & Plan    #MV/AoV Bioprosthetic Endocarditis: -Patient presented with a cold left arm with lack of radial pulse found to have acute occlusion of the left brachial artery now s/p thromboembolectomy with vascular surgery -TEE obtained for cardiac source of embolus and revealed large bioprosthetic mitral valve vegetation and subcentimeter AoV vegetation concerning for endocarditis -Blood cultures positive for enterococcus  -ID consulted and currently on amp, CTX -CT surgery deemed patient too high risk for surgery and  he is recommended for continued medical management -Fortunately, there is no significant MR/AR at this time.  Plan repeat echo in 2 weeks  #RV Failure: #Severe TR: #Chronic Diastolic HF: -Patient with mainly right sided failure followed by Dr. Gasper Lloyd as outpatient -Suspect secondary to chronic RV pacing and severe TR -PCWP and mPAP not significantly elevated and MS only mild on TEE making this unlikely to be contributing to degree of RV dysfunction -Currently compensated on exam -Continue lasix 40mg  daily -Continue jardiance 10mg  daily -Continue dig 0.125mg  daily  #Paroxysmal Afib: -Suspect acute arterial thrombosis was secondary to MV/AV endocarditis rather than apixaban failure -On heparin gtt initially, now back on Eliquis  -Continue home metop 37.5mg  BID  #CAD s/p CABG: -Continue lipitor 20mg  daily -Not on ASA due to need for AC -Continue metop 37.5mg  BID as above  #CHB s/p PPM: -Blood cultures with enterococcus  -On ABX -No evidence of device lead vegetations on TEE, but with valvular vegetations will discuss with EP       For questions or updates, please contact [facility] Please consult [email or web address] for contact info under        Signed, Little Ishikawa, MD  06/21/2022, 9:52 AM

## 2022-06-21 NOTE — Progress Notes (Signed)
Progress Note    [name]   RUE:[id number]  DOB: 08-02-1950  DOA: 06/15/2022     6 PCP: [name], MD  Initial CC: left arm pain  Hospital Course: Mr. Beacham is a 72 yo male with PMH atrial fib, PAD, CAD s/p CABG, CHB s/p PPM, obesity, AV replacement (bioprosthetic), MV replacement (bioprosthetic), HLD, prostate cancer who initially presented to the hospital on 06/15/2022 with left hand numbness and pain. He was admitted and underwent evaluation with vascular surgery.  He was found to have left upper extremity ischemia with CTA evidence of acute occlusion of the high left proximal brachial artery at the level of the left humeral neck. He then underwent left upper extremity thromboembolectomy via brachial and radial artery approach on 06/15/2022.  He recently had cardiac workup on 05/23/2022 after presenting with shortness of breath and found to have elevated troponin and BNP.  He underwent cardiac cath on 05/25/2022 which showed severe mid LAD stenosis.  It was also noted that left radial artery was unable to be accessed.  His symptoms were felt to be due to severe TR and he was referred to outpatient advanced heart failure clinic.  He was also changed from Coumadin to Eliquis.  Due to left brachial artery occlusion, cardiology was consulted to evaluate for possible cardiac source of embolus. He underwent TEE on 06/16/2022 which revealed a large vegetation involving mitral valve and small subcentimeter vegetation involving aortic valve.  Blood cultures on admission then became positive with E faecalis in 2/3 bottles.  He had initially been started on antibiotics on admission which were transitioned to ampicillin and Rocephin after blood cultures resulted.   Interval History:  No notes overnight.  Feeling in his normal state this morning no change in neurologic function. Amenable with PICC line placement.  Assessment and Plan:  Endocarditis E. Faecalis bacteremia - underwent  cardiology eval for cardiac embolic source for LUE arterial thrombus - s/p TEE on 06/16/22 positive for AV and MV vegetations - ID also following - continue rocephin and amp as per ID -CTS recommends against surgery.  Repeat echo in 2 weeks  Left brachial artery occlusion - Patient presented with left arm pain and heaviness as per HPI.  Workup has been above showed acute occlusion of his left proximal brachial artery (now s/p thrombectomy on 06/15/2022) - vascular following -Discontinue heparin drip, difficulty obtaining therapeutic levels - Resume Eliquis on 06/20/2022  Acute CVA -Scattered punctate acute to subacute bilateral cerebral infarcts primarily involving white matter noted on MRI brain performed 06/16/2022 -Suspect cardiac embolic source given clinical workup thus far (suspected septic emboli) - neurology aware  History of MV and AV replacement (bioprosthetic's), 10/2018 with Dr. Cornelius Moras CABG x 4 Clipping of LAE Closure of PFO - all took place on 10/11/2018 with Dr. Cornelius Moras  RV failure Chronic dCHF Severe TR - cardiology following - recently started on dig and jardiance as well - recently referred to AHF outpatient as well - remains compensated    Atrial fibrillation - Recent change from warfarin to Eliquis.  Has been taking Eliquis regularly -Eliquis resumed 06/20/2022 - monitor Hgb and PLTC   Hyponatremia - mild - trend for now   CAD - Status post CABG x 4, see above - Continue home atorvastatin, metoprolol - On heparin as above   Complete heart block - Status post pacemaker.  Paced rhythm observed thus far. - continue tele - follow up EP discussion regarding pacer and current infection   Hyperlipidemia -  Continue home atorvastatin   History of prostate cancer - Noted   Obesity - Noted   Old records reviewed in assessment of this patient  Antimicrobials: Vancomycin 06/16/2022 x 1 Ancef 5/7 >> 5/8 Cefuroxime 5/7 x 1 Ampicillin 5/9 >> current Rocephin 5/8  >> current   DVT prophylaxis:  SCDs Start: 06/15/22 2009 apixaban (ELIQUIS) tablet 5 mg   Code Status:   Code Status: Full Code  Mobility Assessment (last 72 hours)     Mobility Assessment     Row Name 06/21/22 1100 06/20/22 2100 06/20/22 1200 06/20/22 0800 06/19/22 2100   Does patient have an order for bedrest or is patient medically unstable No - Continue assessment No - Continue assessment No - Continue assessment No - Continue assessment No - Continue assessment   What is the highest level of mobility based on the progressive mobility assessment? Level 5 (Walks with assist in room/hall) - Balance while stepping forward/back and can walk in room with assist - Complete Level 5 (Walks with assist in room/hall) - Balance while stepping forward/back and can walk in room with assist - Complete Level 5 (Walks with assist in room/hall) - Balance while stepping forward/back and can walk in room with assist - Complete Level 5 (Walks with assist in room/hall) - Balance while stepping forward/back and can walk in room with assist - Complete Level 5 (Walks with assist in room/hall) - Balance while stepping forward/back and can walk in room with assist - Complete   Is the above level different from baseline mobility prior to current illness? Yes - Recommend PT order Yes - Recommend PT order -- Yes - Recommend PT order Yes - Recommend PT order    Row Name 06/19/22 1445 06/19/22 1051 06/19/22 0815 06/18/22 1611     Does patient have an order for bedrest or is patient medically unstable No - Continue assessment -- No - Continue assessment No - Continue assessment    What is the highest level of mobility based on the progressive mobility assessment? -- Level 4 (Walks with assist in room) - Balance while marching in place and cannot step forward and back - Complete -- --    Is the above level different from baseline mobility prior to current illness? Yes - Recommend PT order -- Yes - Recommend PT order Yes -  Recommend PT order             Barriers to discharge: none Disposition Plan:  TBD Status is: Inpt  Objective: Blood pressure 100/74, pulse 99, temperature 97.9 F (36.6 C), temperature source Oral, resp. rate 20, height 5\' 7"  (1.702 m), weight 94.8 kg, SpO2 94 %.  Examination:  Physical Exam Constitutional:      Appearance: Normal appearance.  HENT:     Head: Normocephalic and atraumatic.     Mouth/Throat:     Mouth: Mucous membranes are moist.  Eyes:     Extraocular Movements: Extraocular movements intact.  Cardiovascular:     Rate and Rhythm: Normal rate and regular rhythm.  Pulmonary:     Effort: Pulmonary effort is normal.     Breath sounds: Normal breath sounds.  Abdominal:     General: Bowel sounds are normal. There is no distension.     Palpations: Abdomen is soft.     Tenderness: There is no abdominal tenderness.  Musculoskeletal:     Cervical back: Normal range of motion and neck supple.     Comments: LUE noted with sutures in place and surrounding  ecchymoses; mild edema  Skin:    General: Skin is warm and dry.  Neurological:     General: No focal deficit present.     Mental Status: He is alert.     Sensory: No sensory deficit.     Comments: Bilateral lower extremity weakness from deconditioning but nothing focal  Psychiatric:        Mood and Affect: Mood normal.        Behavior: Behavior normal.      Consultants:  ID Vascular surgery Cardiothoracic surgery Neurology Cardiology  Procedures:  06/15/2022: Left upper extremity thromboembolectomy via brachial and radial artery approach 06/15/2022: Left upper extremity angiogram of left subclavian and axillary arteries 06/16/2022: TEE  Data Reviewed: Results for orders placed or performed during the hospital encounter of 06/15/22 (from the past 24 hour(s))  Basic metabolic panel     Status: Abnormal   Collection Time: 06/21/22  1:46 AM  Result Value Ref Range   Sodium 130 (L) 135 - 145 mmol/L    Potassium 3.6 3.5 - 5.1 mmol/L   Chloride 104 98 - 111 mmol/L   CO2 22 22 - 32 mmol/L   Glucose, Bld 107 (H) 70 - 99 mg/dL   BUN 12 8 - 23 mg/dL   Creatinine, Ser 4.25 0.61 - 1.24 mg/dL   Calcium 7.7 (L) 8.9 - 10.3 mg/dL   GFR, Estimated >95 >63 mL/min   Anion gap 4 (L) 5 - 15  CBC with Differential/Platelet     Status: Abnormal   Collection Time: 06/21/22  1:46 AM  Result Value Ref Range   WBC 12.1 (H) 4.0 - 10.5 K/uL   RBC 3.80 (L) 4.22 - 5.81 MIL/uL   Hemoglobin 9.5 (L) 13.0 - 17.0 g/dL   HCT 87.5 (L) 64.3 - 32.9 %   MCV 83.2 80.0 - 100.0 fL   MCH 25.0 (L) 26.0 - 34.0 pg   MCHC 30.1 30.0 - 36.0 g/dL   RDW 51.8 (H) 84.1 - 66.0 %   Platelets 72 (L) 150 - 400 K/uL   nRBC 0.2 0.0 - 0.2 %   Neutrophils Relative % 83 %   Neutro Abs 9.9 (H) 1.7 - 7.7 K/uL   Lymphocytes Relative 7 %   Lymphs Abs 0.9 0.7 - 4.0 K/uL   Monocytes Relative 6 %   Monocytes Absolute 0.8 0.1 - 1.0 K/uL   Eosinophils Relative 1 %   Eosinophils Absolute 0.2 0.0 - 0.5 K/uL   Basophils Relative 0 %   Basophils Absolute 0.0 0.0 - 0.1 K/uL   Immature Granulocytes 3 %   Abs Immature Granulocytes 0.41 (H) 0.00 - 0.07 K/uL  Magnesium     Status: None   Collection Time: 06/21/22  1:46 AM  Result Value Ref Range   Magnesium 2.3 1.7 - 2.4 mg/dL    I have reviewed pertinent nursing notes, vitals, labs, and images as necessary. I have ordered labwork to follow up on as indicated.  I have reviewed the last notes from staff over past 24 hours. I have discussed patient's care plan and test results with nursing staff, CM/SW, and other staff as appropriate.  Time spent: Greater than 50% of the 55 minute visit was spent in counseling/coordination of care for the patient as laid out in the A&P.   LOS: 6 days   Lewie Chamber, MD [facility] 06/21/2022, 12:24 PM

## 2022-06-22 ENCOUNTER — Encounter: Payer: Self-pay | Admitting: Internal Medicine

## 2022-06-22 ENCOUNTER — Other Ambulatory Visit: Payer: Self-pay | Admitting: Cardiology

## 2022-06-22 DIAGNOSIS — I330 Acute and subacute infective endocarditis: Secondary | ICD-10-CM

## 2022-06-22 DIAGNOSIS — I998 Other disorder of circulatory system: Secondary | ICD-10-CM | POA: Diagnosis not present

## 2022-06-22 DIAGNOSIS — R7881 Bacteremia: Secondary | ICD-10-CM | POA: Diagnosis not present

## 2022-06-22 DIAGNOSIS — I70208 Unspecified atherosclerosis of native arteries of extremities, other extremity: Secondary | ICD-10-CM | POA: Diagnosis not present

## 2022-06-22 LAB — CBC WITH DIFFERENTIAL/PLATELET
Abs Immature Granulocytes: 0.3 10*3/uL — ABNORMAL HIGH (ref 0.00–0.07)
Basophils Absolute: 0 10*3/uL (ref 0.0–0.1)
Basophils Relative: 0 %
Eosinophils Absolute: 0.2 10*3/uL (ref 0.0–0.5)
Eosinophils Relative: 2 %
HCT: 32.7 % — ABNORMAL LOW (ref 39.0–52.0)
Hemoglobin: 9.8 g/dL — ABNORMAL LOW (ref 13.0–17.0)
Immature Granulocytes: 3 %
Lymphocytes Relative: 8 %
Lymphs Abs: 0.8 10*3/uL (ref 0.7–4.0)
MCH: 25.3 pg — ABNORMAL LOW (ref 26.0–34.0)
MCHC: 30 g/dL (ref 30.0–36.0)
MCV: 84.5 fL (ref 80.0–100.0)
Monocytes Absolute: 0.8 10*3/uL (ref 0.1–1.0)
Monocytes Relative: 8 %
Neutro Abs: 8 10*3/uL — ABNORMAL HIGH (ref 1.7–7.7)
Neutrophils Relative %: 79 %
Platelets: 102 10*3/uL — ABNORMAL LOW (ref 150–400)
RBC: 3.87 MIL/uL — ABNORMAL LOW (ref 4.22–5.81)
RDW: 21.6 % — ABNORMAL HIGH (ref 11.5–15.5)
WBC: 10 10*3/uL (ref 4.0–10.5)
nRBC: 0 % (ref 0.0–0.2)

## 2022-06-22 LAB — BASIC METABOLIC PANEL
Anion gap: 12 (ref 5–15)
BUN: 10 mg/dL (ref 8–23)
CO2: 24 mmol/L (ref 22–32)
Calcium: 8.1 mg/dL — ABNORMAL LOW (ref 8.9–10.3)
Chloride: 98 mmol/L (ref 98–111)
Creatinine, Ser: 0.86 mg/dL (ref 0.61–1.24)
GFR, Estimated: >60 mL/min (ref >60)
Glucose, Bld: 100 mg/dL — ABNORMAL HIGH (ref 70–99)
Potassium: 3.3 mmol/L — ABNORMAL LOW (ref 3.5–5.1)
Sodium: 134 mmol/L — ABNORMAL LOW (ref 135–145)

## 2022-06-22 LAB — MAGNESIUM: Magnesium: 2.2 mg/dL (ref 1.7–2.4)

## 2022-06-22 LAB — CULTURE, BLOOD (ROUTINE X 2)

## 2022-06-22 MED ORDER — POTASSIUM CHLORIDE CRYS ER 20 MEQ PO TBCR
40.0000 meq | EXTENDED_RELEASE_TABLET | Freq: Once | ORAL | Status: AC
  Administered 2022-06-22: 40 meq via ORAL
  Filled 2022-06-22: qty 2

## 2022-06-22 MED ORDER — ACETAMINOPHEN 325 MG PO TABS: 650.0000 mg | ORAL_TABLET | ORAL | Status: DC | PRN

## 2022-06-22 NOTE — Progress Notes (Signed)
Pt and spouse provided discharge information via SWAT RN this afternoon.  Infusion nurse in this evening to provide education and supplies for home.  Pt discharged to home with double lumen picc for home IV abx.

## 2022-06-22 NOTE — Care Management Important Message (Signed)
Important Message  Patient Details  Name: [name] MRN: [id number] Date of Birth: [birth year]   Medicare Important Message Given:  Yes     [name] 06/22/2022, 10:32 AM

## 2022-06-22 NOTE — Progress Notes (Signed)
Completed medication details on the AVS. Patient is awaiting home infusion setup and education according to patient's RN.  Orvan Seen SWOT RN

## 2022-06-22 NOTE — TOC Transition Note (Signed)
Transition of Care (TOC) - CM/SW Discharge Note Donn Pierini RN, BSN Transitions of Care Unit 4E- RN Case Manager See Treatment Team for direct phone #   Patient Details  Name: BUSH BUEHRING MRN: [id number] Date of Birth: [birth year]  Transition of Care [facility]) CM/SW Contact:  Darrold Span, RN Phone Number: 06/22/2022, 2:44 PM   Clinical Narrative:    Pt stable for transition home today, PICC line has been placed, HH resumed with Frances Furbish for HHRN/PT/OT- per liaison they will plan to make visit tomorrow for abx need support.  CM has also spoken with Pam-home infusion liaison- she has spoken with wife yesterday and will plan to meet at the bedside later today to connect pt to home infusion pump- anticipate timing for connection to be around 3-4 pm this afternoon- Pam to connect with wife as well to communicate the timing.  Pt will need to stay in house until home infusion has seen pt and connected to home pump.   No further TOC needs noted. Family to transport home later this afternoon.    Final next level of care: Home w Home Health Services Barriers to Discharge: Barriers Resolved   Patient Goals and CMS Choice CMS Medicare.gov Compare Post Acute Care list provided to:: Patient Choice offered to / list presented to : Patient  Discharge Placement                 Home w/ [facility]        Discharge Plan and Services Additional resources added to the After Visit Summary for     Discharge Planning Services: CM Consult Post Acute Care Choice: Home Health          DME Arranged: N/A DME Agency: NA       HH Arranged: RN, PT, OT, IV Antibiotics HH Agency: [facility], [facility] Date [facility] Agency Contacted: 06/21/22 Time HH Agency Contacted: 1430 Representative spoke with at [facility] Agency: Cory/Pam  Social Determinants of Health (SDOH) Interventions SDOH Screenings   Food Insecurity: No Food Insecurity (06/16/2022)  Housing: Low Risk  (06/16/2022)  Transportation  Needs: No Transportation Needs (06/16/2022)  Utilities: Not At Risk (06/16/2022)  Depression (PHQ2-9): Low Risk  (05/11/2022)  Financial Resource Strain: Low Risk  (05/11/2022)  Physical Activity: Inactive (05/11/2022)  Stress: No Stress Concern Present (05/11/2022)  Tobacco Use: Medium Risk (06/16/2022)     Readmission Risk Interventions    06/22/2022    2:44 PM  Readmission Risk Prevention Plan  Transportation Screening Complete  PCP or Specialist Appt within 3-5 Days Complete  HRI or Home Care Consult Complete  Social Work Consult for Recovery Care Planning/Counseling Complete  Palliative Care Screening Not Applicable  Medication Review Oceanographer) Complete

## 2022-06-22 NOTE — Discharge Summary (Signed)
Physician Discharge Summary   [name] ZOX:[id number] DOB: Feb 26, 1950 DOA: 06/15/2022  PCP: [name], MD  Admit date: 06/15/2022 Discharge date: 06/22/2022  Admitted From: Home Disposition:  Home Discharging physician: Lewie Chamber, MD Barriers to discharge: none  Recommendations at discharge: Follow up with ID Repeat echo in 2 weeks Continue abx for 6 weeks then indefinite amoxicillin   Home Health: RN, PT, OT  Discharge Condition: stable CODE STATUS: Full Diet recommendation:  Diet Orders (From admission, onward)     Start     Ordered   06/22/22 0000  Diet general        06/22/22 0948   06/17/22 1009  Diet regular Room service appropriate? Yes with Assist; Fluid consistency: Thin; Fluid restriction: 2000 mL Fluid  Diet effective now       Question Answer Comment  Room service appropriate? Yes with Assist   Fluid consistency: Thin   Fluid restriction: 2000 mL Fluid      06/17/22 1011            Hospital Course: [name] is a 72 yo male with PMH atrial fib, PAD, CAD s/p CABG, CHB s/p PPM, obesity, AV replacement (bioprosthetic), MV replacement (bioprosthetic), HLD, prostate cancer who initially presented to the hospital on 06/15/2022 with left hand numbness and pain. He was admitted and underwent evaluation with vascular surgery.  He was found to have left upper extremity ischemia with CTA evidence of acute occlusion of the high left proximal brachial artery at the level of the left humeral neck. He then underwent left upper extremity thromboembolectomy via brachial and radial artery approach on 06/15/2022.  He recently had cardiac workup on 05/23/2022 after presenting with shortness of breath and found to have elevated troponin and BNP.  He underwent cardiac cath on 05/25/2022 which showed severe mid LAD stenosis.  It was also noted that left radial artery was unable to be accessed.  His symptoms were felt to be due to severe TR and he was referred to outpatient  advanced heart failure clinic.  He was also changed from Coumadin to Eliquis.  Due to left brachial artery occlusion, cardiology was consulted to evaluate for possible cardiac source of embolus. He underwent TEE on 06/16/2022 which revealed a large vegetation involving mitral valve and small subcentimeter vegetation involving aortic valve.  Blood cultures on admission then became positive with E faecalis in 2/3 bottles.  He had initially been started on antibiotics on admission which were transitioned to ampicillin and Rocephin after blood cultures resulted.  Assessment and Plan: Bacteremia due to Enterococcus - 2/3 bottles positive from admission - Repeat blood cultures on 06/18/2022 -See endocarditis - per ID: "Continue ampicillin and ceftriaxone for IE coverage x 6 weeks from negative Cx on 06/18/22, then suppressive amox 500 mg PO bid after that for lifelong "  Endocarditis - underwent cardiology eval for cardiac embolic source for LUE arterial thrombus - s/p TEE on 06/16/22 positive for AV and MV vegetations - ID also following - continue rocephin and amp as per ID - CTS recommends against any surgery.  Repeat echo in 2 weeks  Brachial artery occlusion, left (HCC) - Patient presented with left arm pain and heaviness as per HPI.  Workup has been above showed acute occlusion of his left proximal brachial artery (now s/p thrombectomy on 06/15/2022) - vascular following -Patient transitioned back to Eliquis prior to discharge  Acute CVA (cerebrovascular accident) (HCC) Scattered punctate acute to subacute bilateral cerebral infarcts primarily involving white  matter noted on MRI brain performed 06/16/2022 -Suspect cardiac embolic source given clinical workup thus far (suspected septic emboli) - neurology aware - anticoag choices discussed with cardiology as well; patient resumed back on Eliquis       The patient's chronic medical conditions were treated accordingly per the patient's home  medication regimen except as noted.  On day of discharge, patient was felt deemed stable for discharge. Patient/family member advised to call PCP or come back to ER if needed.   Principal Diagnosis: Ischemia of left upper extremity  Discharge Diagnoses: Active Hospital Problems   Diagnosis Date Noted   Ischemia of left upper extremity 06/15/2022   Endocarditis 06/17/2022    Priority: 1.   Bacteremia due to Enterococcus 06/17/2022    Priority: 1.   Brachial artery occlusion, left (HCC) 06/15/2022    Priority: 2.   Acute CVA (cerebrovascular accident) (HCC) 06/17/2022    Priority: 3.   H/O aortic valve replacement 10/11/2018    Priority: 4.   History of mitral valve replacement 10/11/2018    Priority: 4.   Chronic diastolic heart failure (HCC) 06/18/2022   Paroxysmal A-fib (HCC) 06/15/2022    Resolved Hospital Problems  No resolved problems to display.     Discharge Instructions     [facility] pharmacist to adjust dose for Vancomycin, Aminoglycosides and other anti-infective therapies as requested by physician.   Complete by: As directed    [facility] to provide Cath Flo 2mg    Complete by: As directed    Administer for PICC line occlusion and as ordered by physician for other access device issues.   Anaphylaxis Kit: Provided to treat any anaphylactic reaction to the medication being provided to the patient if First Dose or when requested by physician   Complete by: As directed    Epinephrine 1mg /ml vial / amp: Administer 0.3mg  (0.72ml) subcutaneously once for moderate to severe anaphylaxis, nurse to call physician and pharmacy when reaction occurs and call 911 if needed for immediate care   Diphenhydramine 50mg /ml IV vial: Administer 25-50mg  IV/IM PRN for first dose reaction, rash, itching, mild reaction, nurse to call physician and pharmacy when reaction occurs   Sodium Chloride 0.9% NS IV: Administer if needed for hypovolemic blood pressure drop or  as ordered by physician after call to physician with anaphylactic reaction   Change dressing on IV access line weekly and PRN   Complete by: As directed    Diet general   Complete by: As directed    Flush IV access with Sodium Chloride 0.9% and Heparin 10 units/ml or 100 units/ml   Complete by: As directed    Home infusion instructions - [facility]   Complete by: As directed    Instructions: Flush IV access with Sodium Chloride 0.9% and Heparin 10units/ml or 100units/ml   Change dressing on IV access line: Weekly and PRN   Instructions Cath Flo 2mg : Administer for PICC Line occlusion and as ordered by physician for other access device   [facility] pharmacist to adjust dose for: Vancomycin, Aminoglycosides and other anti-infective therapies as requested by physician   Increase activity slowly   Complete by: As directed    Method of administration may be changed at the discretion of home infusion pharmacist based upon assessment of the patient and/or caregiver's ability to self-administer the medication ordered   Complete by: As directed    No wound care   Complete by: As directed  Allergies as of 06/22/2022   No Known Allergies      Medication List     TAKE these medications    acetaminophen 325 MG tablet Commonly known as: TYLENOL Take 2 tablets (650 mg total) by mouth every 4 (four) hours as needed for moderate pain. What changed: when to take this   ampicillin  IVPB Inject 12 g into the vein daily. As a continuous infusion. Indication:  Enterococcal endocarditis  First Dose: Yes Last Day of Therapy:  07/30/22 Labs - Once weekly:  CBC/D and BMP, Labs - Every other week:  ESR and CRP Method of administration: Ambulatory Pump (Continuous Infusion) Method of administration may be changed at the discretion of home infusion pharmacist based upon assessment of the patient and/or caregiver's ability to self-administer the medication ordered.    atorvastatin 20 MG tablet Commonly known as: LIPITOR Take 1 tablet (20 mg total) by mouth daily.   cefTRIAXone  IVPB Commonly known as: ROCEPHIN Inject 2 g into the vein every 12 (twelve) hours. Indication:  Enterococcal endocarditis  First Dose: Yes Last Day of Therapy:  07/30/22 Labs - Once weekly:  CBC/D and BMP, Labs - Every other week:  ESR and CRP Method of administration: IV Push Method of administration may be changed at the discretion of home infusion pharmacist based upon assessment of the patient and/or caregiver's ability to self-administer the medication ordered.   digoxin 0.125 MG tablet Commonly known as: LANOXIN Take 1 tablet (0.125 mg total) by mouth daily.   Eliquis 5 MG Tabs tablet Generic drug: apixaban Take 1 tablet (5 mg total) by mouth 2 (two) times daily.   empagliflozin 10 MG Tabs tablet Commonly known as: Jardiance Take 1 tablet (10 mg total) by mouth daily before breakfast.   Iron (Ferrous Sulfate) 325 (65 Fe) MG Tabs Take 1 tablet by mouth 2 (two) times daily. What changed: when to take this   Lasix 20 MG tablet Generic drug: furosemide Take 20 mg by mouth 2 (two) times daily.   metoprolol tartrate 25 MG tablet Commonly known as: LOPRESSOR Take 1.5 tablets (37.5 mg total) by mouth 2 (two) times daily.   oxymetazoline 0.05 % nasal spray Commonly known as: AFRIN Place 1 spray into both nostrils at bedtime as needed for congestion.   polyethylene glycol powder 17 GM/SCOOP powder Commonly known as: GLYCOLAX/MIRALAX Take 17 g by mouth daily as needed for mild constipation.               Discharge Care Instructions  (From admission, onward)           Start     Ordered   06/21/22 0000  Change dressing on IV access line weekly and PRN  (Home infusion instructions - [facility] )        06/21/22 1447            Follow-up Information     [facility] Follow up.   Specialty: Home Health Services Why:  HHRN/PT/OT arranged- they will contact you post discharge (will coordinate with home infusion for home abx needs) Contact information: [address] [place] [address] [phone]         [facility] Follow up.   Why: Home Infusion pharmacy- for abx needs- they will meet with you prior to discharge to provide education and "hook" you up to your home infusion.        Sabharwal, Aditya, DO. Go on 07/14/2022.   Specialty: Cardiology Contact  information: [address] [place] [address] [phone]         [name], MD. Schedule an appointment as soon as possible for a visit in 2 week(s).   Specialty: Family Medicine Contact information: [address] [place] [address] [phone]         Danelle Earthly, MD. Go on 07/12/2022.   Specialty: Infectious Diseases Contact information: [address], [address] [place] [address] [phone]                No Known Allergies  Consultations: ID Vascular surgery Cardiothoracic surgery Neurology Cardiology  Procedures: 06/15/2022: Left upper extremity thromboembolectomy via brachial and radial artery approach 06/15/2022: Left upper extremity angiogram of left subclavian and axillary arteries 06/16/2022: TEE  Discharge Exam: BP 138/74 (BP Location: Right Leg)   Pulse 95   Temp 98.4 F (36.9 C) (Oral)   Resp 18   Ht 5\' 7"  (1.702 m)   Wt 94.8 kg   SpO2 96%   BMI 32.73 kg/m  Physical Exam Constitutional:      Appearance: Normal appearance.  HENT:     Head: Normocephalic and atraumatic.     Mouth/Throat:     Mouth: Mucous membranes are moist.  Eyes:     Extraocular Movements: Extraocular movements intact.  Cardiovascular:     Rate and Rhythm: Normal rate and regular rhythm.  Pulmonary:     Effort: Pulmonary effort is normal.     Breath sounds: Normal breath sounds.  Abdominal:     General: Bowel sounds are normal. There is no distension.      Palpations: Abdomen is soft.     Tenderness: There is no abdominal tenderness.  Musculoskeletal:     Cervical back: Normal range of motion and neck supple.     Comments: LUE noted with sutures in place and surrounding ecchymoses; mild edema  Skin:    General: Skin is warm and dry.  Neurological:     General: No focal deficit present.     Mental Status: He is alert.     Sensory: No sensory deficit.     Comments: Bilateral lower extremity weakness from deconditioning but nothing focal  Psychiatric:        Mood and Affect: Mood normal.        Behavior: Behavior normal.      The results of significant diagnostics from this hospitalization (including imaging, microbiology, ancillary and laboratory) are listed below for reference.   Microbiology: Recent Results (from the past 240 hour(s))  Surgical pcr screen     Status: None   Collection Time: 06/15/22 10:33 AM   Specimen: Nasal Mucosa; Nasal Swab  Result Value Ref Range Status   MRSA, PCR NEGATIVE NEGATIVE Final   Staphylococcus aureus NEGATIVE NEGATIVE Final    Comment: (NOTE) The Xpert SA Assay (FDA approved for NASAL specimens in patients 11 years of age and older), is one component of a comprehensive surveillance program. It is not intended to diagnose infection nor to guide or monitor treatment. Performed at [facility], [address] [place], [place] [address]   Culture, blood (Routine X 2) w Reflex to ID Panel     Status: Abnormal   Collection Time: 06/16/22 12:58 PM   Specimen: BLOOD RIGHT HAND  Result Value Ref Range Status   Specimen Description BLOOD RIGHT HAND  Final   Special Requests   Final    BOTTLES DRAWN AEROBIC AND ANAEROBIC Blood Culture results may  not be optimal due to an inadequate volume of blood received in culture bottles   Culture  Setup Time   Final    GRAM POSITIVE COCCI IN CHAINS IN BOTH AEROBIC AND ANAEROBIC BOTTLES CRITICAL RESULT CALLED TO, READ BACK BY AND VERIFIED WITH: PHARMD  E.SINCLAIR AT 1610 ON 06/17/2022 BY T.SAAD. Performed at [facility], [address], [place], [place] [address]    Culture ENTEROCOCCUS FAECALIS (A)  Final   Report Status 06/19/2022 FINAL  Final   Organism ID, Bacteria ENTEROCOCCUS FAECALIS  Final      Susceptibility   Enterococcus faecalis - MIC*    AMPICILLIN <=2 SENSITIVE Sensitive     VANCOMYCIN 2 SENSITIVE Sensitive     GENTAMICIN SYNERGY SENSITIVE Sensitive     * ENTEROCOCCUS FAECALIS  Blood Culture ID Panel (Reflexed)     Status: Abnormal   Collection Time: 06/16/22 12:58 PM  Result Value Ref Range Status   Enterococcus faecalis DETECTED (A) NOT DETECTED Final    Comment: CRITICAL RESULT CALLED TO, READ BACK BY AND VERIFIED WITH: PHARMD E.SINCLAIR AT 4098 ON 06/17/2022 BY T.SAAD.    Enterococcus Faecium NOT DETECTED NOT DETECTED Final   Listeria monocytogenes NOT DETECTED NOT DETECTED Final   Staphylococcus species NOT DETECTED NOT DETECTED Final   Staphylococcus aureus (BCID) NOT DETECTED NOT DETECTED Final   Staphylococcus epidermidis NOT DETECTED NOT DETECTED Final   Staphylococcus lugdunensis NOT DETECTED NOT DETECTED Final   Streptococcus species NOT DETECTED NOT DETECTED Final   Streptococcus agalactiae NOT DETECTED NOT DETECTED Final   Streptococcus pneumoniae NOT DETECTED NOT DETECTED Final   Streptococcus pyogenes NOT DETECTED NOT DETECTED Final   A.calcoaceticus-baumannii NOT DETECTED NOT DETECTED Final   Bacteroides fragilis NOT DETECTED NOT DETECTED Final   Enterobacterales NOT DETECTED NOT DETECTED Final   Enterobacter cloacae complex NOT DETECTED NOT DETECTED Final   Escherichia coli NOT DETECTED NOT DETECTED Final   Klebsiella aerogenes NOT DETECTED NOT DETECTED Final   Klebsiella oxytoca NOT DETECTED NOT DETECTED Final   Klebsiella pneumoniae NOT DETECTED NOT DETECTED Final   Proteus species NOT DETECTED NOT DETECTED Final   Salmonella species NOT DETECTED NOT DETECTED Final   Serratia marcescens  NOT DETECTED NOT DETECTED Final   Haemophilus influenzae NOT DETECTED NOT DETECTED Final   Neisseria meningitidis NOT DETECTED NOT DETECTED Final   Pseudomonas aeruginosa NOT DETECTED NOT DETECTED Final   Stenotrophomonas maltophilia NOT DETECTED NOT DETECTED Final   Candida albicans NOT DETECTED NOT DETECTED Final   Candida auris NOT DETECTED NOT DETECTED Final   Candida glabrata NOT DETECTED NOT DETECTED Final   Candida krusei NOT DETECTED NOT DETECTED Final   Candida parapsilosis NOT DETECTED NOT DETECTED Final   Candida tropicalis NOT DETECTED NOT DETECTED Final   Cryptococcus neoformans/gattii NOT DETECTED NOT DETECTED Final   Vancomycin resistance NOT DETECTED NOT DETECTED Final    Comment: Performed at [facility], [address], [place], [place] [address]  Culture, blood (Routine X 2) w Reflex to ID Panel     Status: Abnormal   Collection Time: 06/16/22  1:09 PM   Specimen: BLOOD RIGHT HAND  Result Value Ref Range Status   Specimen Description BLOOD RIGHT HAND  Final   Special Requests   Final    BOTTLES DRAWN AEROBIC ONLY Blood Culture results may not be optimal due to an inadequate volume of blood received in culture bottles   Culture  Setup Time   Final    GRAM  POSITIVE COCCI IN CHAINS AEROBIC BOTTLE ONLY CRITICAL VALUE NOTED.  VALUE IS CONSISTENT WITH PREVIOUSLY REPORTED AND CALLED VALUE.    Culture (A)  Final    ENTEROCOCCUS FAECALIS SUSCEPTIBILITIES PERFORMED ON PREVIOUS CULTURE WITHIN THE LAST 5 DAYS. Performed at [facility], [address] [place], [place] [address]    Report Status 06/19/2022 FINAL  Final  Culture, blood (Routine X 2) w Reflex to ID Panel     Status: None (Preliminary result)   Collection Time: 06/18/22  9:04 AM   Specimen: BLOOD  Result Value Ref Range Status   Specimen Description BLOOD BLOOD RIGHT ARM  Final   Special Requests AEROBIC BOTTLE ONLY  Final   Culture   Final    NO GROWTH 4 DAYS Performed at [facility]  Lab, [address], [place], [place] [address]    Report Status PENDING  Incomplete  Culture, blood (Routine X 2) w Reflex to ID Panel     Status: None (Preliminary result)   Collection Time: 06/18/22  9:04 AM   Specimen: BLOOD  Result Value Ref Range Status   Specimen Description BLOOD BLOOD RIGHT HAND  Final   Special Requests   Final    BOTTLES DRAWN AEROBIC AND ANAEROBIC Blood Culture adequate volume   Culture   Final    NO GROWTH 4 DAYS Performed at [facility], [address], [place], [place] [address]    Report Status PENDING  Incomplete     Labs: BNP (last 3 results) Recent Labs    05/23/22 1126 06/09/22 1552  BNP 312.7* 442.7*   Basic Metabolic Panel: Recent Labs  Lab 06/18/22 0829 06/19/22 0123 06/20/22 0119 06/21/22 0146 06/22/22 0510  NA 133* 132* 134* 130* 134*  K 3.4* 3.1* 4.0 3.6 3.3*  CL 102 100 101 104 98  CO2 21* 22 23 22 24   GLUCOSE 103* 108* 103* 107* 100*  BUN 19 16 12 12 10   CREATININE 1.00 1.05 0.91 0.97 0.86  CALCIUM 8.3* 7.9* 8.0* 7.7* 8.1*  MG 2.4 2.2 2.3 2.3 2.2   Liver Function Tests: Recent Labs  Lab 06/15/22 2242  AST 25  ALT 14  ALKPHOS 74  BILITOT 1.7*  PROT 6.3*  ALBUMIN 2.6*   No results for input(s): "LIPASE", "AMYLASE" in the last 168 hours. No results for input(s): "AMMONIA" in the last 168 hours. CBC: Recent Labs  Lab 06/18/22 0829 06/19/22 0123 06/20/22 0119 06/21/22 0146 06/22/22 0510  WBC 17.3* 13.6* 14.2* 12.1* 10.0  NEUTROABS 14.2* 11.2* 11.6* 9.9* 8.0*  HGB 10.7* 9.8* 10.0* 9.5* 9.8*  HCT 34.8* 32.6* 32.8* 31.6* 32.7*  MCV 83.5 84.5 84.1 83.2 84.5  PLT 88* 65* 58* 72* 102*   Cardiac Enzymes: No results for input(s): "CKTOTAL", "CKMB", "CKMBINDEX", "TROPONINI" in the last 168 hours. BNP: Invalid input(s): "POCBNP" CBG: No results for input(s): "GLUCAP" in the last 168 hours. D-Dimer No results for input(s): "DDIMER" in the last 72 hours. Hgb A1c No results for input(s): "HGBA1C" in the last  72 hours. Lipid Profile No results for input(s): "CHOL", "HDL", "LDLCALC", "TRIG", "CHOLHDL", "LDLDIRECT" in the last 72 hours. Thyroid function studies No results for input(s): "TSH", "T4TOTAL", "T3FREE", "THYROIDAB" in the last 72 hours.  Invalid input(s): "FREET3" Anemia work up No results for input(s): "VITAMINB12", "FOLATE", "FERRITIN", "TIBC", "IRON", "RETICCTPCT" in the last 72 hours. Urinalysis    Component Value Date/Time   COLORURINE YELLOW 10/09/2018 1544   APPEARANCEUR CLEAR 10/09/2018 1544  LABSPEC 1.015 10/09/2018 1544   PHURINE 5.0 10/09/2018 1544   GLUCOSEU NEGATIVE 10/09/2018 1544   HGBUR NEGATIVE 10/09/2018 1544   BILIRUBINUR NEGATIVE 10/09/2018 1544   BILIRUBINUR neg 03/25/2015 1053   KETONESUR NEGATIVE 10/09/2018 1544   PROTEINUR NEGATIVE 10/09/2018 1544   UROBILINOGEN 4.0 03/25/2015 1053   NITRITE NEGATIVE 10/09/2018 1544   LEUKOCYTESUR NEGATIVE 10/09/2018 1544   Sepsis Labs Recent Labs  Lab 06/19/22 0123 06/20/22 0119 06/21/22 0146 06/22/22 0510  WBC 13.6* 14.2* 12.1* 10.0   Microbiology Recent Results (from the past 240 hour(s))  Surgical pcr screen     Status: None   Collection Time: 06/15/22 10:33 AM   Specimen: Nasal Mucosa; Nasal Swab  Result Value Ref Range Status   MRSA, PCR NEGATIVE NEGATIVE Final   Staphylococcus aureus NEGATIVE NEGATIVE Final    Comment: (NOTE) The Xpert SA Assay (FDA approved for NASAL specimens in patients 33 years of age and older), is one component of a comprehensive surveillance program. It is not intended to diagnose infection nor to guide or monitor treatment. Performed at [facility], [address], [place], [place] [address]   Culture, blood (Routine X 2) w Reflex to ID Panel     Status: Abnormal   Collection Time: 06/16/22 12:58 PM   Specimen: BLOOD RIGHT HAND  Result Value Ref Range Status   Specimen Description BLOOD RIGHT HAND  Final   Special Requests   Final    BOTTLES DRAWN AEROBIC AND  ANAEROBIC Blood Culture results may not be optimal due to an inadequate volume of blood received in culture bottles   Culture  Setup Time   Final    GRAM POSITIVE COCCI IN CHAINS IN BOTH AEROBIC AND ANAEROBIC BOTTLES CRITICAL RESULT CALLED TO, READ BACK BY AND VERIFIED WITH: PHARMD E.SINCLAIR AT 6045 ON 06/17/2022 BY T.SAAD. Performed at [facility], [address], [place], [place] [address]    Culture ENTEROCOCCUS FAECALIS (A)  Final   Report Status 06/19/2022 FINAL  Final   Organism ID, Bacteria ENTEROCOCCUS FAECALIS  Final      Susceptibility   Enterococcus faecalis - MIC*    AMPICILLIN <=2 SENSITIVE Sensitive     VANCOMYCIN 2 SENSITIVE Sensitive     GENTAMICIN SYNERGY SENSITIVE Sensitive     * ENTEROCOCCUS FAECALIS  Blood Culture ID Panel (Reflexed)     Status: Abnormal   Collection Time: 06/16/22 12:58 PM  Result Value Ref Range Status   Enterococcus faecalis DETECTED (A) NOT DETECTED Final    Comment: CRITICAL RESULT CALLED TO, READ BACK BY AND VERIFIED WITH: PHARMD E.SINCLAIR AT 1914 ON 06/17/2022 BY T.SAAD.    Enterococcus Faecium NOT DETECTED NOT DETECTED Final   Listeria monocytogenes NOT DETECTED NOT DETECTED Final   Staphylococcus species NOT DETECTED NOT DETECTED Final   Staphylococcus aureus (BCID) NOT DETECTED NOT DETECTED Final   Staphylococcus epidermidis NOT DETECTED NOT DETECTED Final   Staphylococcus lugdunensis NOT DETECTED NOT DETECTED Final   Streptococcus species NOT DETECTED NOT DETECTED Final   Streptococcus agalactiae NOT DETECTED NOT DETECTED Final   Streptococcus pneumoniae NOT DETECTED NOT DETECTED Final   Streptococcus pyogenes NOT DETECTED NOT DETECTED Final   A.calcoaceticus-baumannii NOT DETECTED NOT DETECTED Final   Bacteroides fragilis NOT DETECTED NOT DETECTED Final   Enterobacterales NOT DETECTED NOT DETECTED Final   Enterobacter cloacae complex NOT DETECTED NOT DETECTED Final   Escherichia coli NOT DETECTED NOT DETECTED Final    Klebsiella aerogenes NOT DETECTED NOT DETECTED Final  Klebsiella oxytoca NOT DETECTED NOT DETECTED Final   Klebsiella pneumoniae NOT DETECTED NOT DETECTED Final   Proteus species NOT DETECTED NOT DETECTED Final   Salmonella species NOT DETECTED NOT DETECTED Final   Serratia marcescens NOT DETECTED NOT DETECTED Final   Haemophilus influenzae NOT DETECTED NOT DETECTED Final   Neisseria meningitidis NOT DETECTED NOT DETECTED Final   Pseudomonas aeruginosa NOT DETECTED NOT DETECTED Final   Stenotrophomonas maltophilia NOT DETECTED NOT DETECTED Final   Candida albicans NOT DETECTED NOT DETECTED Final   Candida auris NOT DETECTED NOT DETECTED Final   Candida glabrata NOT DETECTED NOT DETECTED Final   Candida krusei NOT DETECTED NOT DETECTED Final   Candida parapsilosis NOT DETECTED NOT DETECTED Final   Candida tropicalis NOT DETECTED NOT DETECTED Final   Cryptococcus neoformans/gattii NOT DETECTED NOT DETECTED Final   Vancomycin resistance NOT DETECTED NOT DETECTED Final    Comment: Performed at [facility], [address] [place], [place] [address]  Culture, blood (Routine X 2) w Reflex to ID Panel     Status: Abnormal   Collection Time: 06/16/22  1:09 PM   Specimen: BLOOD RIGHT HAND  Result Value Ref Range Status   Specimen Description BLOOD RIGHT HAND  Final   Special Requests   Final    BOTTLES DRAWN AEROBIC ONLY Blood Culture results may not be optimal due to an inadequate volume of blood received in culture bottles   Culture  Setup Time   Final    GRAM POSITIVE COCCI IN CHAINS AEROBIC BOTTLE ONLY CRITICAL VALUE NOTED.  VALUE IS CONSISTENT WITH PREVIOUSLY REPORTED AND CALLED VALUE.    Culture (A)  Final    ENTEROCOCCUS FAECALIS SUSCEPTIBILITIES PERFORMED ON PREVIOUS CULTURE WITHIN THE LAST 5 DAYS. Performed at [facility], [address], [place], [place] [address]    Report Status 06/19/2022 FINAL  Final  Culture, blood (Routine X 2) w Reflex to ID Panel      Status: None (Preliminary result)   Collection Time: 06/18/22  9:04 AM   Specimen: BLOOD  Result Value Ref Range Status   Specimen Description BLOOD BLOOD RIGHT ARM  Final   Special Requests AEROBIC BOTTLE ONLY  Final   Culture   Final    NO GROWTH 4 DAYS Performed at [facility], [address] [place], [place] [address]    Report Status PENDING  Incomplete  Culture, blood (Routine X 2) w Reflex to ID Panel     Status: None (Preliminary result)   Collection Time: 06/18/22  9:04 AM   Specimen: BLOOD  Result Value Ref Range Status   Specimen Description BLOOD BLOOD RIGHT HAND  Final   Special Requests   Final    BOTTLES DRAWN AEROBIC AND ANAEROBIC Blood Culture adequate volume   Culture   Final    NO GROWTH 4 DAYS Performed at [facility], [address], [place], [place] [address]    Report Status PENDING  Incomplete    Procedures/Studies: ECHO TEE  Result Date: 06/21/2022    TRANSESOPHOGEAL ECHO REPORT   Patient Name:   [name] Date of Exam: 06/16/2022 Medical Rec #:  [id number]      Height:       67.0 in Accession #:    [id number]     Weight:       209.0 lb Date of Birth:  [birth year]      BSA:  2.060 m Patient Age:    71 years       BP:           95/80 mmHg Patient Gender: M              HR:           83 bpm. 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 CAD and Previous Myocardial Infarction,                  Abnormal ECG, Prior CABG and Pacemaker, Aortic Valve Disease                  and Mitral Valve Disease, Arrythmias:Atrial Flutter and Atrial                  Fibrillation; Risk Factors:Former Smoker.  Sonographer:     Sheralyn Boatman RDCS Referring Phys:  [id number] LAURA R INGOLD Diagnosing Phys: Dorthula Nettles PROCEDURE: After  discussion of the risks and benefits of a TEE, an informed consent was obtained from the patient. The transesophogeal probe was passed without difficulty through the esophogus of the patient. Imaged were obtained with the patient in a left lateral decubitus position. Sedation performed by different physician. The patient was monitored while under deep sedation. Anesthestetic sedation was provided intravenously by Anesthesiology: 134mg  of Propofol. The patient's vital signs; including heart rate, blood pressure, and oxygen saturation; remained stable throughout the procedure. The patient developed no complications during the procedure.  IMPRESSIONS  1. Left ventricular ejection fraction, by estimation, is 50 to 55%. The left ventricle has normal function. Left ventricular endocardial border not optimally defined to evaluate regional wall motion.  2. Right ventricular systolic function is moderately reduced. The right ventricular size is moderately enlarged.  3. There is severe 4+ torrential tricuspid regurgitation.  4. There is a 27mm Medtronic bioprosthesis in the mitral position. There is a large vegetation measuring 2x2cm encompassing the enterity of the bioprosthetic leaflets. There is no paravalvular regurgitation or valvular dehisence. There is no mitral stenosis.  5. There is a 23mm bovine Edwards prosthesis in the aortic position. The valve appears thickened with a small filamentous subcentimeter vegetation at the aortic aspect of the valve. There is no paravalvular regurgitation. There does not appear to be thickening of abscess in the proximal aortic root or aortomitral curtain. FINDINGS  Left Ventricle: Left ventricular ejection fraction, by estimation, is 50 to 55%. The left ventricle has normal function. Left ventricular endocardial border not optimally defined to evaluate regional wall motion. The left ventricular internal cavity size was normal in size. Right Ventricle: The right ventricular size is  moderately enlarged. No increase in right ventricular wall thickness. Right ventricular systolic function is moderately reduced. Left Atrium: LAA is ligated. Left atrial size was moderately dilated. No left atrial/left atrial appendage thrombus was detected. Right Atrium: Right atrial size was moderately dilated. Pericardium: There is no evidence of pericardial effusion. Mitral Valve: The mitral valve has been repaired/replaced. No evidence of mitral valve regurgitation. There is a 27 mm Medtronic bioprosthetic valve present in the mitral position. Echo findings are consistent with vegetation on the mitral prosthesis. MV  peak gradient, 16.2  mmHg. The mean mitral valve gradient is 7.0 mmHg. Tricuspid Valve: The tricuspid valve is grossly normal. Tricuspid valve regurgitation is severe. No evidence of tricuspid stenosis. Aortic Valve: The aortic valve has been repaired/replaced. Aortic valve regurgitation is not visualized. No aortic stenosis is present. Aortic valve mean gradient measures 6.0 mmHg. Aortic valve peak gradient measures 10.5 mmHg. There is a 23 mm Edwards bovine valve present in the aortic position. Pulmonic Valve: The pulmonic valve was normal in structure. Pulmonic valve regurgitation is not visualized. Aorta: The aortic root was not well visualized. IAS/Shunts: No atrial level shunt detected by color flow Doppler. Additional Comments: A device lead is visualized. Spectral Doppler performed. LEFT VENTRICLE PLAX 2D LVOT diam:     2.50 cm LVOT Area:     4.91 cm  AORTIC VALVE AV Vmax:      162.00 cm/s AV Vmean:     107.000 cm/s AV VTI:       0.257 m AV Peak Grad: 10.5 mmHg AV Mean Grad: 6.0 mmHg MITRAL VALVE MV Peak grad: 16.2 mmHg  SHUNTS MV Mean grad: 7.0 mmHg   Systemic Diam: 2.50 cm MV Vmax:      2.01 m/s MV Vmean:     126.5 cm/s Aditya Sabharwal Electronically signed by Dorthula Nettles Signature Date/Time: 06/21/2022/4:20:59 PM    Final    [place] EKG SITE RITE  Result Date: 06/21/2022 If Site Rite  image not attached, placement could not be confirmed due to current cardiac rhythm.  MR BRAIN WO CONTRAST  Result Date: 06/16/2022 CLINICAL DATA:  Neuro deficit, acute, stroke suspected. Slurred speech. EXAM: MRI HEAD WITHOUT CONTRAST TECHNIQUE: Multiplanar, multiecho pulse sequences of the brain and surrounding structures were obtained without intravenous contrast. COMPARISON:  Head CT and CTA 06/15/2022 FINDINGS: Brain: There are punctate foci of mildly restricted diffusion involving white matter of the posterior left temporal lobe, left occipital lobe, and posterior right frontal lobe as well as cortex in the right postcentral gyrus. A few small chronic bilateral cerebral microhemorrhages are noted. Small T2 hyperintensities in the cerebral white matter bilaterally are nonspecific but compatible with minimal chronic small vessel ischemic disease. No mass, midline shift, or extra-axial fluid collection is evident. There is mild cerebral and cerebellar atrophy. There may be a tiny chronic right cerebellar infarct. Vascular: Major intracranial vascular flow voids are preserved. Skull and upper cervical spine: Unremarkable bone marrow signal. Sinuses/Orbits: Unremarkable orbits. Paranasal sinuses and mastoid air cells are clear. Other: None. IMPRESSION: Scattered punctate acute to subacute bilateral cerebral infarcts primarily involving white matter. Electronically Signed   By: Sebastian Ache M.D.   On: 06/16/2022 16:22   EP STUDY  Result Date: 06/16/2022 See surgical note for result.  HYBRID OR IMAGING ([facility] ONLY)  Result Date: 06/15/2022 There is no interpretation for this exam.  This order is for images obtained during a surgical procedure.  Please See "Surgeries" Tab for more information regarding the procedure.   CT ANGIO UP EXTREM LEFT W &/OR WO CONTAST  Result Date: 06/15/2022 CLINICAL DATA:  Acute aortic syndrome, left arm weakness, diminished pulses EXAM: CT ANGIOGRAPHY OF THE LEFT UPPEREXTREMITY  TECHNIQUE: Multidetector CT imaging of the LEFT upperwas performed using the standard protocol during bolus administration of intravenous contrast. Multiplanar CT image reconstructions and MIPs were obtained to evaluate the vascular anatomy. RADIATION DOSE REDUCTION: This exam was performed according to the departmental dose-optimization program which includes automated exposure control, adjustment of the mA and/or kV according to patient size and/or use of  iterative reconstruction technique. CONTRAST:  OMNIPAQUE IOHEXOL 350 MG/ML SOLN COMPARISON:  None Available. FINDINGS: Included chest demonstrates atherosclerosis of aorta without significant aneurysm. No aortic dissection, mediastinal hemorrhage or hematoma. Postop changes from coronary bypass. Native coronary atherosclerosis and aortic valve calcifications present. Included central pulmonary arteries are patent. Bovine arch anatomy appearing patent and tortuosity. Included lungs demonstrate no acute airspace process. Basilar atelectasis. Pleural thickening noted on the left. No acute osseous finding. Left upper extremity: left subclavian, vertebral, and axillary arteries are patent. There is acute occlusion of the high left brachial artery at the level of the left humeral neck. Below this the left brachial artery is non-opacified. Limited assessment of the more peripheral left upper extremities because of the proximal brachial artery occlusion. Review of the MIP images confirms the above findings. IMPRESSION: Acute occlusion of the high left proximal brachial artery at the level of the left humeral neck. These results were called by telephone at the time of interpretation on 06/15/2022 at 10:17 am to provider Dr. Atilano Median, who verbally acknowledged these results. 4 Electronically Signed   By: Judie Petit.  Shick M.D.   On: 06/15/2022 10:19   CT HEAD CODE STROKE WO CONTRAST  Result Date: 06/15/2022 CLINICAL DATA:  Code stroke.  Left arm weakness and slurred  speech. EXAM: CT ANGIOGRAPHY HEAD AND NECK TECHNIQUE: Multidetector CT imaging of the head and neck was performed using the standard protocol during bolus administration of intravenous contrast. Multiplanar CT image reconstructions and MIPs were obtained to evaluate the vascular anatomy. Carotid stenosis measurements (when applicable) are obtained utilizing NASCET criteria, using the distal internal carotid diameter as the denominator. RADIATION DOSE REDUCTION: This exam was performed according to the departmental dose-optimization program which includes automated exposure control, adjustment of the mA and/or kV according to patient size and/or use of iterative reconstruction technique. CONTRAST:  OMNIPAQUE IOHEXOL 350 MG/ML SOLN COMPARISON:  None Available. FINDINGS: CT HEAD FINDINGS Brain: There is no acute intracranial hemorrhage, extra-axial fluid collection, or acute infarct. Parenchymal volume is normal. The ventricles are normal in size. Saladin-white differentiation is preserved. The pituitary and suprasellar region are normal. There is no mass lesion. There is no mass effect or midline shift. Vascular: No hyperdense vessel is seen. There is a small calcification overlying the right temporal lobe which may reflect a vascular calcification of indeterminate age. Skull: Normal. Negative for fracture or focal lesion. Sinuses/Orbits: The paranasal sinuses are clear. The globes and orbits are unremarkable. Other: None. ASPECTS [facility] Stroke Program Early CT Score) - Ganglionic level infarction (caudate, lentiform nuclei, internal capsule, insula, M1-M3 cortex): 7 - Supraganglionic infarction (M4-M6 cortex): 3 Total score (0-10 with 10 being normal): 10 CTA NECK FINDINGS Aortic arch: There is calcified plaque in the imaged aortic arch. The origins of the major branch vessels are patent. The subclavian arteries are patent to the level imaged. Right carotid system: The right common, internal, and external  carotid arteries are patent, without hemodynamically significant stenosis or occlusion. There is no evidence of dissection or aneurysm. Left carotid system: The left common, internal, and external carotid arteries are patent, with minimal plaque of the bifurcation but no significant stenosis or occlusion. There is no evidence of dissection or aneurysm. Vertebral arteries: The vertebral arteries are patent, without hemodynamically significant stenosis or occlusion. There is no evidence of dissection or aneurysm. Skeleton: There is no acute osseous abnormality or suspicious osseous lesion. There is no visible canal hematoma. Other neck: The soft tissues of the neck are  unremarkable. Upper chest: Assessed on the separately dictated CTA chest. Review of the MIP images confirms the above findings CTA HEAD FINDINGS Anterior circulation: The intracranial ICAs are patent, with minimal plaque but no hemodynamically significant stenosis or occlusion. The bilateral MCAs are patent, without proximal stenosis or occlusion. The bilateral ACAs are patent, without proximal stenosis or occlusion. There is focal calcification and a right M3 branch without occlusion (6-167). The anterior communicating artery is normal. There is no aneurysm or AVM. Posterior circulation: The bilateral V4 segments are patent. The basilar artery is patent. The major cerebellar arteries appear patent. The bilateral PCAs are patent, without proximal stenosis or occlusion. The right PCA is primarily supplied by the posterior communicating artery with a diminutive P1 segment (fetal origin). A small left posterior communicating artery is also identified. There is no aneurysm or AVM. Venous sinuses: Not well assessed due to bolus timing. Anatomic variants: As above. Review of the MIP images confirms the above findings IMPRESSION: 1. No acute intracranial pathology. 2. Patent vasculature of the head and neck with no hemodynamically significant stenosis or  occlusion. Findings communicated to Dr Amada Jupiter via Loretha Stapler at 8:40am. The CTA results were communicated at 8:54 am. Electronically Signed   By: Lesia Hausen M.D.   On: 06/15/2022 09:20   CT ANGIO HEAD NECK W WO CM (CODE STROKE)  Result Date: 06/15/2022 CLINICAL DATA:  Code stroke.  Left arm weakness and slurred speech. EXAM: CT ANGIOGRAPHY HEAD AND NECK TECHNIQUE: Multidetector CT imaging of the head and neck was performed using the standard protocol during bolus administration of intravenous contrast. Multiplanar CT image reconstructions and MIPs were obtained to evaluate the vascular anatomy. Carotid stenosis measurements (when applicable) are obtained utilizing NASCET criteria, using the distal internal carotid diameter as the denominator. RADIATION DOSE REDUCTION: This exam was performed according to the departmental dose-optimization program which includes automated exposure control, adjustment of the mA and/or kV according to patient size and/or use of iterative reconstruction technique. CONTRAST:  OMNIPAQUE IOHEXOL 350 MG/ML SOLN COMPARISON:  None Available. FINDINGS: CT HEAD FINDINGS Brain: There is no acute intracranial hemorrhage, extra-axial fluid collection, or acute infarct. Parenchymal volume is normal. The ventricles are normal in size. Mondry-white differentiation is preserved. The pituitary and suprasellar region are normal. There is no mass lesion. There is no mass effect or midline shift. Vascular: No hyperdense vessel is seen. There is a small calcification overlying the right temporal lobe which may reflect a vascular calcification of indeterminate age. Skull: Normal. Negative for fracture or focal lesion. Sinuses/Orbits: The paranasal sinuses are clear. The globes and orbits are unremarkable. Other: None. ASPECTS [facility] Stroke Program Early CT Score) - Ganglionic level infarction (caudate, lentiform nuclei, internal capsule, insula, M1-M3 cortex): 7 - Supraganglionic infarction  (M4-M6 cortex): 3 Total score (0-10 with 10 being normal): 10 CTA NECK FINDINGS Aortic arch: There is calcified plaque in the imaged aortic arch. The origins of the major branch vessels are patent. The subclavian arteries are patent to the level imaged. Right carotid system: The right common, internal, and external carotid arteries are patent, without hemodynamically significant stenosis or occlusion. There is no evidence of dissection or aneurysm. Left carotid system: The left common, internal, and external carotid arteries are patent, with minimal plaque of the bifurcation but no significant stenosis or occlusion. There is no evidence of dissection or aneurysm. Vertebral arteries: The vertebral arteries are patent, without hemodynamically significant stenosis or occlusion. There is no evidence of dissection or aneurysm. Skeleton: There  is no acute osseous abnormality or suspicious osseous lesion. There is no visible canal hematoma. Other neck: The soft tissues of the neck are unremarkable. Upper chest: Assessed on the separately dictated CTA chest. Review of the MIP images confirms the above findings CTA HEAD FINDINGS Anterior circulation: The intracranial ICAs are patent, with minimal plaque but no hemodynamically significant stenosis or occlusion. The bilateral MCAs are patent, without proximal stenosis or occlusion. The bilateral ACAs are patent, without proximal stenosis or occlusion. There is focal calcification and a right M3 branch without occlusion (6-167). The anterior communicating artery is normal. There is no aneurysm or AVM. Posterior circulation: The bilateral V4 segments are patent. The basilar artery is patent. The major cerebellar arteries appear patent. The bilateral PCAs are patent, without proximal stenosis or occlusion. The right PCA is primarily supplied by the posterior communicating artery with a diminutive P1 segment (fetal origin). A small left posterior communicating artery is also  identified. There is no aneurysm or AVM. Venous sinuses: Not well assessed due to bolus timing. Anatomic variants: As above. Review of the MIP images confirms the above findings IMPRESSION: 1. No acute intracranial pathology. 2. Patent vasculature of the head and neck with no hemodynamically significant stenosis or occlusion. Findings communicated to Dr Amada Jupiter via Loretha Stapler at 8:40am. The CTA results were communicated at 8:54 am. Electronically Signed   By: Lesia Hausen M.D.   On: 06/15/2022 09:20   CARDIAC CATHETERIZATION  Result Date: 05/25/2022   Prox LAD lesion is 40% stenosed.   Mid LAD lesion is 95% stenosed.   Prox Cx to Mid Cx lesion is 30% stenosed.   1st Mrg lesion is 30% stenosed.   3rd Mrg lesion is 30% stenosed.   Prox RCA lesion is 100% stenosed.   SVG graft was visualized by angiography and is normal in caliber.   LIMA graft was visualized by angiography and is normal in caliber. Severe mid LAD stenosis. Competitive filling is seen distally from the patent LIMA graft. Patent LIMA graft to the mid LAD. Mild non-obstructive disease in the Circumflex artery. Large dominant RCA with chronic proximal occlusion. The mid and distal RCA fills from the patent vein graft. Near normal right and left heart pressures. CO 6.92 L/min CI 3.33 Recommendations: His symptoms are likely related to his severe TR. He does not appear to need further diuresis. Medical management of CAD. Consider Advanced Heart Failure team consult.   CT KNEE LEFT W CONTRAST  Result Date: 05/24/2022 CLINICAL DATA:  Left leg pain and weakness. Had a knee aspiration several weeks ago. Possible pseudoaneurysm seen in the popliteal fossa on DVT study. EXAM: CT OF THE LEFT KNEE WITH CONTRAST TECHNIQUE: Multidetector CT imaging was performed following the standard protocol during bolus administration of intravenous contrast. RADIATION DOSE REDUCTION: This exam was performed according to the departmental dose-optimization program which  includes automated exposure control, adjustment of the mA and/or kV according to patient size and/or use of iterative reconstruction technique. CONTRAST:  OMNIPAQUE IOHEXOL 350 MG/ML SOLN COMPARISON:  None Available. FINDINGS: Bones/Joint/Cartilage No fracture or dislocation. Moderate to severe medial compartment joint space narrowing with near bone-on-bone apposition medially. The patellofemoral and lateral compartment joint spaces are relatively preserved. Small to moderate joint effusion. Fluid extends into the popliteus tendon sheath. No Baker cyst. Ligaments Ligaments are suboptimally evaluated by CT. Muscles and Tendons Grossly intact.  No significant muscle atrophy. Soft tissue 2.3 x 1.9 x 2.1 cm cystic lesion with rim enhancement posterior to the lateral  femoral condyle and lateral to the popliteal vessels, likely a ganglion cyst. No pseudoaneurysm. No worrisome soft tissue mass. IMPRESSION: 1. Moderate to severe medial compartment osteoarthritis with small to moderate joint effusion. 2. 2.3 cm ganglion cyst posterior to the lateral femoral condyle and lateral to the popliteal vessels. No pseudoaneurysm or worrisome soft tissue mass. Electronically Signed   By: Obie Dredge M.D.   On: 05/24/2022 09:18   VAS [place] LOWER EXTREMITY VENOUS (DVT) (7a-7p)  Result Date: 05/24/2022  Lower Venous DVT Study Patient Name:  LEALAND RIZK  Date of Exam:   05/23/2022 Medical Rec #: [id number]       Accession #:    [id number] Date of Birth: 26-Aug-1950       Patient Gender: M Patient Age:   25 years Exam Location:  [facility] Procedure:      VAS [place] LOWER EXTREMITY VENOUS (DVT) Referring Phys: Harlene Salts --------------------------------------------------------------------------------  Indications: Left leg pain with walking. Other Indications: Patient had fluid drained off knee 05/05/22. Comparison Study: No prior study on file Performing Technologist: Sherren Kerns RVS  Examination Guidelines: A  complete evaluation includes B-mode imaging, spectral Doppler, color Doppler, and power Doppler as needed of all accessible portions of each vessel. Bilateral testing is considered an integral part of a complete examination. Limited examinations for reoccurring indications may be performed as noted. The reflux portion of the exam is performed with the patient in reverse Trendelenburg.  +---------+---------------+---------+-----------+----------+--------------+ RIGHT    CompressibilityPhasicitySpontaneityPropertiesThrombus Aging +---------+---------------+---------+-----------+----------+--------------+ CFV      Full           Yes      Yes                                 +---------+---------------+---------+-----------+----------+--------------+ SFJ      Full                                                        +---------+---------------+---------+-----------+----------+--------------+ FV Prox  Full                                                        +---------+---------------+---------+-----------+----------+--------------+ FV Mid   Full           Yes      Yes                                 +---------+---------------+---------+-----------+----------+--------------+ FV DistalFull                                                        +---------+---------------+---------+-----------+----------+--------------+ PFV      Full                                                        +---------+---------------+---------+-----------+----------+--------------+  POP      Full           Yes      Yes                                 +---------+---------------+---------+-----------+----------+--------------+ PTV      Full                                                        +---------+---------------+---------+-----------+----------+--------------+ PERO     Full                                                         +---------+---------------+---------+-----------+----------+--------------+   +---------+---------------+---------+-----------+----------+--------------+ LEFT     CompressibilityPhasicitySpontaneityPropertiesThrombus Aging +---------+---------------+---------+-----------+----------+--------------+ CFV      Full           Yes      Yes                                 +---------+---------------+---------+-----------+----------+--------------+ SFJ      Full                                                        +---------+---------------+---------+-----------+----------+--------------+ FV Prox  Full                                                        +---------+---------------+---------+-----------+----------+--------------+ FV Mid   Full           Yes      Yes                                 +---------+---------------+---------+-----------+----------+--------------+ FV DistalFull                                                        +---------+---------------+---------+-----------+----------+--------------+ PFV      Full                                                        +---------+---------------+---------+-----------+----------+--------------+ POP      Full           Yes      Yes                                 +---------+---------------+---------+-----------+----------+--------------+  PTV      Full                                                        +---------+---------------+---------+-----------+----------+--------------+ PERO     Full                                                        +---------+---------------+---------+-----------+----------+--------------+     Summary: RIGHT: - There is no evidence of deep vein thrombosis in the lower extremity.  - No cystic structure found in the popliteal fossa.  LEFT: - There is no evidence of deep vein thrombosis in the lower extremity.  - There appears to be, what may be consistent with,  a partially thrombosed pseudoaneurysm in the popliteal fossa/proximal calf area adjacent to an effusion filled with mixed echoes. Further imaging should be considered to differentiate/classify findings.  *See table(s) above for measurements and observations. Electronically signed by Coral Else MD on 05/24/2022 at 8:05:33 AM.    Final    ECHOCARDIOGRAM COMPLETE  Result Date: 05/23/2022    ECHOCARDIOGRAM REPORT   Patient Name:   [name] Date of Exam: 05/23/2022 Medical Rec #:  [id number]      Height:       67.0 in Accession #:    [id number]     Weight:       223.0 lb Date of Birth:  Sep 30, 1950      BSA:          2.118 m Patient Age:    71 years       BP:           106/76 mmHg Patient Gender: M              HR:           96 bpm. Exam Location:  Inpatient Procedure: 2D Echo, Cardiac Doppler, Color Doppler and Intracardiac            Opacification Agent Indications:    Dyspnea  History:        Patient has prior history of Echocardiogram examinations, most                 recent 06/13/2019. Aortic Valve Disease and Mitral Valve Disease.                 23mm Edwards Inspiris Resilia AVR, 27mm Bovine AVR.  Sonographer:    Milbert Coulter Referring Phys: [id number] Cecille Po MELVIN  Sonographer Comments: No subcostal window. Image acquisition challenging due to patient body habitus. IMPRESSIONS  1. Right ventricular systolic function is moderately reduced. The right ventricular size is severely enlarged.  2. Left ventricular ejection fraction, by estimation, is 55 to 60%. The left ventricle has normal function. The left ventricle has no regional wall motion abnormalities. Left ventricular diastolic parameters are indeterminate. There is the interventricular septum is flattened in systole, consistent with right ventricular pressure overload and the interventricular septum is flattened in diastole ('D' shaped left ventricle), consistent with right ventricular volume overload.  3. The mitral valve has been  repaired/replaced. Trivial mitral valve regurgitation. There is at least moderate mitral valve prosthetic  stenosis with a gradient is 11.0 mmHg at HR 95bpm.  4. Tricuspid valve regurgitation is severe.  5. The aortic valve has been repaired/replaced. Aortic valve regurgitation is not visualized. Echo findings are consistent with normal structure and function of the aortic valve prosthesis. Aortic valve mean gradient measures 7.0 mmHg. Aortic valve Vmax  measures 1.78 m/s. DI 0.5.  6. Left atrial size was severely dilated.  7. Right atrial size was severely dilated. Comparison(s): Compared to prior TTE in 06/2019, the RV is now severely dilated and moderately hypokinetic with severe TR. FINDINGS  Left Ventricle: Left ventricular ejection fraction, by estimation, is 55 to 60%. The left ventricle has normal function. The left ventricle has no regional wall motion abnormalities. Definity contrast agent was given IV to delineate the left ventricular  endocardial borders. The left ventricular internal cavity size was small. There is no left ventricular hypertrophy. The interventricular septum is flattened in systole, consistent with right ventricular pressure overload and the interventricular septum is flattened in diastole ('D' shaped left ventricle), consistent with right ventricular volume overload. Left ventricular diastolic parameters are indeterminate. Right Ventricle: The right ventricular size is severely enlarged. Right vetricular wall thickness was not well visualized. Right ventricular systolic function is moderately reduced. Left Atrium: Left atrial size was severely dilated. Right Atrium: Right atrial size was severely dilated. Pericardium: There is no evidence of pericardial effusion. Mitral Valve: There is at least moderate prosthetic valve stenosis with mean gradient at HR 95bpm. The mitral valve has been repaired/replaced. Trivial mitral valve regurgitation. There is a 27 mm Medtronic bioprosthetic  valve present in the mitral position. MV peak gradient, 16.8 mmHg. The mean mitral valve gradient is 11.0 mmHg. Tricuspid Valve: The tricuspid valve is grossly normal. Tricuspid valve regurgitation is severe. Aortic Valve: DI 0.51. The aortic valve has been repaired/replaced. Aortic valve regurgitation is not visualized. Aortic valve mean gradient measures 7.0 mmHg. Aortic valve peak gradient measures 12.7 mmHg. There is a 23 mm bioprosthetic valve present in  the aortic position. Echo findings are consistent with normal structure and function of the aortic valve prosthesis. Pulmonic Valve: The pulmonic valve was normal in structure. Pulmonic valve regurgitation is trivial. Aorta: The aortic root is normal in size and structure. IAS/Shunts: The atrial septum is grossly normal. Additional Comments: A device lead is visualized.  LEFT VENTRICLE PLAX 2D LVIDd:         3.90 cm LVIDs:         3.20 cm LV PW:         1.10 cm LV IVS:        1.00 cm  RIGHT VENTRICLE RV S prime:     7.07 cm/s TAPSE (M-mode): 1.1 cm LEFT ATRIUM         Index LA diam:    5.70 cm 2.69 cm/m  AORTIC VALVE AV Vmax:           178.00 cm/s AV Vmean:          121.500 cm/s AV VTI:            0.292 m AV Peak Grad:      12.7 mmHg AV Mean Grad:      7.0 mmHg LVOT Vmax:         98.00 cm/s LVOT Vmean:        66.600 cm/s LVOT VTI:          0.158 m LVOT/AV VTI ratio: 0.54 MITRAL VALVE MV Peak grad: 16.8 mmHg  SHUNTS  MV Mean grad: 11.0 mmHg  Systemic VTI: 0.16 m MV Vmax:      2.05 m/s MV Vmean:     161.0 cm/s Laurance Flatten MD Electronically signed by Laurance Flatten MD Signature Date/Time: 05/23/2022/5:39:50 PM    Final      Time coordinating discharge: Over 30 minutes    Lewie Chamber, MD  [facility] 06/22/2022, 3:47 PM

## 2022-06-22 NOTE — Progress Notes (Signed)
Rounding Note    Patient Name: [name] Date of Encounter: 06/22/2022  [facility] Cardiologist: Verne Carrow, MD   Subjective   BP 101/83. Cr 0.86.  Denies any chest pain or dyspnea  Inpatient Medications    Scheduled Meds:  apixaban  5 mg Oral BID   atorvastatin  20 mg Oral Daily   Chlorhexidine Gluconate Cloth  6 each Topical Daily   digoxin  0.125 mg Oral Daily   docusate sodium  100 mg Oral BID   empagliflozin  10 mg Oral QAC breakfast   furosemide  40 mg Oral Daily   metoprolol tartrate  37.5 mg Oral BID   pantoprazole  40 mg Oral Daily   potassium chloride  40 mEq Oral Once   sodium chloride flush  10-40 mL Intracatheter Q12H   sodium chloride flush  3 mL Intravenous Once   sodium chloride flush  3 mL Intravenous Q12H   Continuous Infusions:  sodium chloride     sodium chloride Stopped (06/16/22 1826)   ampicillin (OMNIPEN) IV 2 g (06/22/22 0406)   cefTRIAXone (ROCEPHIN)  IV 2 g (06/21/22 2125)   PRN Meds: sodium chloride, sodium chloride, acetaminophen **OR** acetaminophen, alum & mag hydroxide-simeth, bisacodyl, guaiFENesin-dextromethorphan, metoprolol tartrate, morphine injection, ondansetron, phenol, senna-docusate, sodium chloride flush, sodium chloride flush, traMADol   Vital Signs    Vitals:   06/21/22 1946 06/21/22 2301 06/22/22 0305 06/22/22 0813  BP: 123/71 125/75 101/65 101/83  Pulse: (!) 108 96 95   Resp: 16 20 20 20   Temp: 98.2 F (36.8 C) 98.2 F (36.8 C) 98.6 F (37 C) 97.9 F (36.6 C)  TempSrc: Oral Oral Oral Oral  SpO2: 96% 95% 96%   Weight:      Height:        Intake/Output Summary (Last 24 hours) at 06/22/2022 0933 Last data filed at 06/21/2022 2230 Gross per 24 hour  Intake 480 ml  Output 2800 ml  Net -2320 ml       06/15/2022    9:19 AM 06/09/2022    3:09 PM 06/03/2022   11:46 AM  Last 3 Weights  Weight (lbs) 209 lb 209 lb 212 lb  Weight (kg) 94.802 kg 94.802 kg 96.163 kg      Telemetry     A-sensed with v-pacing with frequent PVCs - Personally Reviewed  ECG    No new tracing today - Personally Reviewed  Physical Exam   GEN: No acute distress.   Neck: Mild JVD Cardiac: RRR, 2/6 systolic murmur Respiratory: CTAB GI: Soft, nontender, non-distended  MS: Left arm ecchymosis  Neuro:  Nonfocal  Psych: Normal affect   Labs    High Sensitivity Troponin:   Recent Labs  Lab 05/23/22 1126 05/23/22 1351 05/24/22 0826 05/24/22 1016  TROPONINIHS 185* 178* 88* 72*      Chemistry Recent Labs  Lab 06/15/22 2242 06/16/22 0130 06/20/22 0119 06/21/22 0146 06/22/22 0510  NA 131*   < > 134* 130* 134*  K 4.2   < > 4.0 3.6 3.3*  CL 101   < > 101 104 98  CO2 19*   < > 23 22 24   GLUCOSE 190*   < > 103* 107* 100*  BUN 18   < > 12 12 10   CREATININE 1.08   < > 0.91 0.97 0.86  CALCIUM 8.3*   < > 8.0* 7.7* 8.1*  MG  --    < > 2.3 2.3 2.2  PROT 6.3*  --   --   --   --  ALBUMIN 2.6*  --   --   --   --   AST 25  --   --   --   --   ALT 14  --   --   --   --   ALKPHOS 74  --   --   --   --   BILITOT 1.7*  --   --   --   --   GFRNONAA >60   < > >60 >60 >60  ANIONGAP 11   < > 10 4* 12   < > = values in this interval not displayed.     Lipids No results for input(s): "CHOL", "TRIG", "HDL", "LABVLDL", "LDLCALC", "CHOLHDL" in the last 168 hours.  Hematology Recent Labs  Lab 06/20/22 0119 06/21/22 0146 06/22/22 0510  WBC 14.2* 12.1* 10.0  RBC 3.90* 3.80* 3.87*  HGB 10.0* 9.5* 9.8*  HCT 32.8* 31.6* 32.7*  MCV 84.1 83.2 84.5  MCH 25.6* 25.0* 25.3*  MCHC 30.5 30.1 30.0  RDW 21.4* 21.7* 21.6*  PLT 58* 72* 102*    Thyroid No results for input(s): "TSH", "FREET4" in the last 168 hours.  BNPNo results for input(s): "BNP", "PROBNP" in the last 168 hours.  DDimer No results for input(s): "DDIMER" in the last 168 hours.   Radiology    [place] EKG SITE RITE  Result Date: 06/21/2022 If Site Rite image not attached, placement could not be confirmed due to current cardiac  rhythm.   Cardiac Studies   TEE 05/08 prelim report: KEY FINDINGS:   Left ventricle: normal function. Limited visualization of the LV apex, however, no obvious thrombus noted.  Right ventricle: Moderate to severely dilated with moderately reduced function.  Mitral valve: 27mm Medtronic bioprosthetic valve in the mitral position. There is a large vegetation encompassing a majority of the valve leaflets. There is mild mitral stenosis. The valve is otherwise well seated with no regurgitation.  Aortic valve: 23mm Edwards bovine valve present (10/11/18). The aortic valve leaflets appear thickened with a small subcentimeter vegetation seen on limited views at the aortic aspect of the valve. The valve is otherwise well seated with no paravalvular or valvular regurgitation. No signs of aortic root abscess or pathology at the aortomitral curtain. ECHO: - 05/23/22: Normal LV function, severely dilated RV with moderately reduced function, moderate stenosis of prosthesis (gradient ), severe TR due to poor coaptation of leaflets, s/p AVR as per my personal interpretation - 06/13/19: Normal LV function with septal bowing to the left; dilated RV with mildly reduced function. Mitral bioprosthesis gradient of - 06/27/18: Normal LV function, normal RV function. Severe MS/AS with moderate AI.    CATH: - 05/25/22:   Prox LAD lesion is 40% stenosed.   Mid LAD lesion is 95% stenosed.   Prox Cx to Mid Cx lesion is 30% stenosed.   1st Mrg lesion is 30% stenosed.   3rd Mrg lesion is 30% stenosed.   Prox RCA lesion is 100% stenosed.   SVG graft was visualized by angiography and is normal in caliber.   LIMA graft was visualized by angiography and is normal in caliber.   Severe mid LAD stenosis. Competitive filling is seen distally from the patent LIMA graft.  Patent LIMA graft to the mid LAD.  Mild non-obstructive disease in the Circumflex artery.  Large dominant RCA with chronic proximal occlusion. The mid  and distal RCA fills from the patent vein graft.  Near normal right and left heart pressures.  CO 6.92 L/min  CI 3.33     Diagnostic Dominance: Right  Intervention   - 09/15/2018: Prox RCA lesion is 100% stenosed. Prox Cx to Mid Cx lesion is 30% stenosed. 1st Mrg lesion is 30% stenosed. 3rd Mrg lesion is 30% stenosed. Prox LAD lesion is 40% stenosed. Mid LAD lesion is 95% stenosed.   1. Severe stenosis mid LAD 2. Mild non-obstructive disease in the LAD 3. Chronic occlusion of the proximal RCA. Filling of the proximal, mid and distal RCA from left to right collaterals 4. Severe aortic stenosis (mean gradient 34.1 mmHg, peak to peak gradient 32 mmHg, AVA 1.1 cm2) 5. Severe mitral stenosis by echo.     Patient Profile     73 y.o. male with a hx of CAD with CABG X 2 vessel and L atrial clipping, + foramen ovale closure, s/p aortic and MVR bioprosthetic and PPM, atrial flutter, tobacco abuse, HLD, severe TR with RV dysfunction who presented to the ER with a cold left arm with lack of a pulse found to have acute occlusion of the high left brachial artery now s/p thromboembolectomy. Cardiology was initially consulted for further evaluation for possible cardiac source of embolus. TEE 5/8 with large mitral valve vegetation and subcentimeter AoV vegetation with prelim blood cultures positive for enterococcus concerning for IE.   Assessment & Plan    #MV/AoV Bioprosthetic Endocarditis: -Patient presented with a cold left arm with lack of radial pulse found to have acute occlusion of the left brachial artery now s/p thromboembolectomy with vascular surgery -TEE obtained for cardiac source of embolus and revealed large bioprosthetic mitral valve vegetation and subcentimeter AoV vegetation concerning for endocarditis -Blood cultures positive for enterococcus  -ID consulted and currently on amp, CTX -CT surgery deemed patient too high risk for surgery and he is recommended for continued medical  management -Fortunately, there is no significant MR/AR at this time.  Plan repeat echo in 2 weeks  #RV Failure: #Severe TR: #Chronic Diastolic HF: -Patient with mainly right sided failure followed by Dr. Gasper Lloyd as outpatient -Suspect secondary to chronic RV pacing and severe TR -PCWP and mPAP not significantly elevated and MS only mild on TEE making this unlikely to be contributing to degree of RV dysfunction -Currently compensated on exam -Continue lasix 40mg  daily -Continue jardiance 10mg  daily -Continue dig 0.125mg  daily  #Paroxysmal Afib: -Suspect acute arterial thrombosis was secondary to MV/AV endocarditis rather than apixaban failure -On heparin gtt initially, now back on Eliquis  -Continue home metop 37.5mg  BID  #CAD s/p CABG: -Continue lipitor 20mg  daily -Not on ASA due to need for AC -Continue metop 37.5mg  BID as above  #CHB s/p PPM: -Blood cultures with enterococcus  -On ABX -No evidence of device lead vegetations on TEE, but with valvular vegetations discussed with EP: not candidate for device removal  [facility] will sign off.   Medication Recommendations:  No changes Other recommendations (labs, testing, etc):  Echo in 2 weeks Follow up as an outpatient:  Scheduled with Dr Gasper Lloyd on 07/14/22      For questions or updates, please contact [facility] Please consult [email or web address] for contact info under        Signed, Little Ishikawa, MD  06/22/2022, 9:33 AM

## 2022-06-22 NOTE — Consult Note (Signed)
   [facility] CM Inpatient Consult   06/22/2022  [name] 01-02-1951 [id number]  [facility] [[facility]]  Accountable Care Organization [ACO] Patient: [name]  Primary Care Provider:  [name], MD with [facility]   Patient screened for less than 30 days readmission hospitalization with noted  with high risk score for unplanned readmission risk 7 day length of stay. Also, to assess for potential [facility]  [[facility]] Care Management service needs for post hospital transition for care coordination.  Review of patient's electronic medical record reveals patient is for home with IV infusion.  Met with patient and spouse at the bedside, endorses PCP.  Explained [facility] Care Coordination for post hospital follow up and gave them an appointment reminder card and a 24 hour nurse advise line magnet.  Spoke about the [facility] benefits for post hospitalized patients. Cell phone numbers are the best for contact and spouse number noted as listed in electronic medical record demographics noted with DPR.  Plan: Met briefly with inpatient TOC RNCM no other needs assessed. Currently awaiting teaching for post hospital home infusion teaching and noted follow up with Infectious Disease team.  Of note, [facility] Care Management/Population Health does not replace or interfere with any arrangements made by the Inpatient Transition of Care team.  For questions contact:   Charlesetta Shanks, RN BSN CCM [facility]  [phone] business mobile phone Toll free office [phone]  *Concierge Line  [phone] Fax number: [phone] [name]

## 2022-06-22 NOTE — Progress Notes (Signed)
Ok to give Kdur x1 for k 3.3 per Dr. Frederick Peers.  Ulyses Southward, PharmD, BCIDP, AAHIVP, CPP Infectious Disease Pharmacist 06/22/2022 9:30 AM

## 2022-06-23 ENCOUNTER — Telehealth: Payer: Self-pay

## 2022-06-23 ENCOUNTER — Other Ambulatory Visit (HOSPITAL_COMMUNITY): Payer: Self-pay

## 2022-06-23 DIAGNOSIS — I214 Non-ST elevation (NSTEMI) myocardial infarction: Secondary | ICD-10-CM | POA: Diagnosis not present

## 2022-06-23 DIAGNOSIS — I442 Atrioventricular block, complete: Secondary | ICD-10-CM | POA: Diagnosis not present

## 2022-06-23 DIAGNOSIS — I2510 Atherosclerotic heart disease of native coronary artery without angina pectoris: Secondary | ICD-10-CM | POA: Diagnosis not present

## 2022-06-23 DIAGNOSIS — T826XXA Infection and inflammatory reaction due to cardiac valve prosthesis, initial encounter: Secondary | ICD-10-CM | POA: Diagnosis not present

## 2022-06-23 DIAGNOSIS — I4892 Unspecified atrial flutter: Secondary | ICD-10-CM | POA: Diagnosis not present

## 2022-06-23 DIAGNOSIS — Z48812 Encounter for surgical aftercare following surgery on the circulatory system: Secondary | ICD-10-CM | POA: Diagnosis not present

## 2022-06-23 DIAGNOSIS — I330 Acute and subacute infective endocarditis: Secondary | ICD-10-CM | POA: Diagnosis not present

## 2022-06-23 DIAGNOSIS — I5032 Chronic diastolic (congestive) heart failure: Secondary | ICD-10-CM | POA: Diagnosis not present

## 2022-06-23 DIAGNOSIS — I480 Paroxysmal atrial fibrillation: Secondary | ICD-10-CM | POA: Diagnosis not present

## 2022-06-23 LAB — CULTURE, BLOOD (ROUTINE X 2): Culture: NO GROWTH

## 2022-06-23 NOTE — Telephone Encounter (Signed)
Adelina Mings, RN with [facility] called to report patient is running a temp of 100.5, 2nd check was 100.1, pulse 101. Message sent to Dr.[name] to advise patient.    Adelina Mings call back number [phone]

## 2022-06-23 NOTE — Progress Notes (Signed)
Remote pacemaker transmission.   

## 2022-06-23 NOTE — Telephone Encounter (Signed)
Per Dr.[name] - I would let her know to continue monitoring him, if he fevers again then needs to come in to the ED to get evaluated     Adelina Mings, RN aware to continue to monitor patient - if fever doesn't improve patient will need to go to the ED.   Delia Sitar Lesli Albee, CMA

## 2022-06-24 ENCOUNTER — Telehealth: Payer: Self-pay

## 2022-06-24 DIAGNOSIS — I442 Atrioventricular block, complete: Secondary | ICD-10-CM | POA: Diagnosis not present

## 2022-06-24 DIAGNOSIS — I330 Acute and subacute infective endocarditis: Secondary | ICD-10-CM | POA: Diagnosis not present

## 2022-06-24 DIAGNOSIS — T826XXA Infection and inflammatory reaction due to cardiac valve prosthesis, initial encounter: Secondary | ICD-10-CM | POA: Diagnosis not present

## 2022-06-24 DIAGNOSIS — I214 Non-ST elevation (NSTEMI) myocardial infarction: Secondary | ICD-10-CM | POA: Diagnosis not present

## 2022-06-24 DIAGNOSIS — I4892 Unspecified atrial flutter: Secondary | ICD-10-CM | POA: Diagnosis not present

## 2022-06-24 DIAGNOSIS — Z48812 Encounter for surgical aftercare following surgery on the circulatory system: Secondary | ICD-10-CM | POA: Diagnosis not present

## 2022-06-24 DIAGNOSIS — I480 Paroxysmal atrial fibrillation: Secondary | ICD-10-CM | POA: Diagnosis not present

## 2022-06-24 DIAGNOSIS — I2510 Atherosclerotic heart disease of native coronary artery without angina pectoris: Secondary | ICD-10-CM | POA: Diagnosis not present

## 2022-06-24 DIAGNOSIS — I5032 Chronic diastolic (congestive) heart failure: Secondary | ICD-10-CM | POA: Diagnosis not present

## 2022-06-24 NOTE — Transitions of Care (Post Inpatient/ED Visit) (Signed)
06/24/2022  Name: [name] MRN: [id number] DOB: March 30, 1950  Today's TOC FU Call Status: Today's TOC FU Call Status:: Successful TOC FU Call Competed TOC FU Call Complete Date: 06/24/22  Transition Care Management Follow-up Telephone Call Date of Discharge: 06/22/22 Discharge Facility: [facility]) Type of Discharge: Inpatient Admission Primary Inpatient Discharge Diagnosis:: "arterial occlusion" How have you been since you were released from the hospital?: Better (Pt reported "things going well." He has been resting well. Appetite is fairly good. Denies any pain. His PICC is working and no issues.) Any questions or concerns?: No  Items Reviewed: Did you receive and understand the discharge instructions provided?: Yes Medications obtained,verified, and reconciled?: Yes (Medications Reviewed) Any new allergies since your discharge?: No Dietary orders reviewed?: Yes Type of Diet Ordered:: low salt/heart healthy Do you have support at home?: Yes People in Home: spouse Name of Support/Comfort Primary Source: Janet-wife  Medications Reviewed Today: Medications Reviewed Today     Reviewed by Charlyn Minerva, RN (Registered Nurse) on 06/24/22 at 1135  Med List Status: <None>   Medication Order Taking? Sig Documenting Provider Last Dose Status Informant  acetaminophen (TYLENOL) 325 MG tablet [id number] Yes Take 2 tablets (650 mg total) by mouth every 4 (four) hours as needed for moderate pain. Lewie Chamber, MD Taking Active   ampicillin IVPB [id number] Yes Inject 12 g into the vein daily. As a continuous infusion. Indication:  Enterococcal endocarditis  First Dose: Yes Last Day of Therapy:  07/30/22 Labs - Once weekly:  CBC/D and BMP, Labs - Every other week:  ESR and CRP Method of administration: Ambulatory Pump (Continuous Infusion) Method of administration may be changed at the discretion of home infusion pharmacist based upon assessment of the patient and/or  caregiver's ability to self-administer the medication ordered. Lewie Chamber, MD Taking Active   apixaban (ELIQUIS) 5 MG TABS tablet [id number] Yes Take 1 tablet (5 mg total) by mouth 2 (two) times daily. Kathlen Mody, MD Taking Active Self, Pharmacy Records, Spouse/Significant Other  atorvastatin (LIPITOR) 20 MG tablet [id number] Yes Take 1 tablet (20 mg total) by mouth daily. [name], MD Taking Active Self, Pharmacy Records, Spouse/Significant Other  cefTRIAXone (ROCEPHIN) IVPB [id number] Yes Inject 2 g into the vein every 12 (twelve) hours. Indication:  Enterococcal endocarditis  First Dose: Yes Last Day of Therapy:  07/30/22 Labs - Once weekly:  CBC/D and BMP, Labs - Every other week:  ESR and CRP Method of administration: IV Push Method of administration may be changed at the discretion of home infusion pharmacist based upon assessment of the patient and/or caregiver's ability to self-administer the medication ordered. Lewie Chamber, MD Taking Active   digoxin (LANOXIN) 0.125 MG tablet [id number] Yes Take 1 tablet (0.125 mg total) by mouth daily. Dorthula Nettles, DO Taking Active Self, Pharmacy Records, Spouse/Significant Other  empagliflozin (JARDIANCE) 10 MG TABS tablet [id number] Yes Take 1 tablet (10 mg total) by mouth daily before breakfast. Dorthula Nettles, DO Taking Active Self, Pharmacy Records, Spouse/Significant Other  furosemide (LASIX) 20 MG tablet [id number] Yes Take 20 mg by mouth 2 (two) times daily. [provider] Taking Active Self, Pharmacy Records, Spouse/Significant Other  Iron, Ferrous Sulfate, 325 (65 Fe) MG TABS [id number] Yes Take 1 tablet by mouth 2 (two) times daily.  Patient taking differently: Take 1 tablet by mouth daily.   Kathlen Mody, MD Taking Active Self, Pharmacy Records, Spouse/Significant Other  metoprolol tartrate (LOPRESSOR) 25 MG tablet [id number] Yes Take 1.5 tablets (37.5 mg total)  by mouth 2 (two) times daily. [place], Anderson Malta, FNP  Taking Active Self, Pharmacy Records, Spouse/Significant Other  oxymetazoline (AFRIN) 0.05 % nasal spray [id number] Yes Place 1 spray into both nostrils at bedtime as needed for congestion. [provider] Taking Active Self, Pharmacy Records, Spouse/Significant Other  polyethylene glycol powder (GLYCOLAX/MIRALAX) 17 GM/SCOOP powder [id number] Yes Take 17 g by mouth daily as needed for mild constipation. Kathlen Mody, MD Taking Active Self, Pharmacy Records, Spouse/Significant Other            Home Care and Equipment/Supplies: Were Home Health Services Ordered?: Yes [facility] of [facility] Health Agency:: [facility] Has Agency set up a time to come to your home?: Yes First Home Health Visit Date: 06/23/22 Any new equipment or medical supplies ordered?: Yes Name of Medical supply agency?: [facility] Were you able to get the equipment/medical supplies?: Yes Do you have any questions related to the use of the equipment/supplies?: No  Functional Questionnaire: Do you need assistance with bathing/showering or dressing?: No Do you need assistance with meal preparation?: No Do you need assistance with eating?: No Do you have difficulty maintaining continence: No Do you need assistance with getting out of bed/getting out of a chair/moving?: No Do you have difficulty managing or taking your medications?: No  Follow up appointments reviewed: PCP Follow-up appointment confirmed?: No (Offered to assist pt with making PCP follow up appt during call-pt prefers to wait until wife is home and call office to make an appt later) MD Provider Line [phone] Given: No Specialist Hospital Follow-up appointment confirmed?: Yes Date of Specialist follow-up appointment?: 07/12/22 Follow-Up Specialty Provider:: Dr. Thedore Mins, Dr. Sabharwal-07/14/22 Do you need transportation to your follow-up appointment?: No Do you understand care options if your condition(s) worsen?: Yes-patient verbalized  understanding  SDOH Interventions Today    Flowsheet Row Most Recent Value  SDOH Interventions   Food Insecurity Interventions Intervention Not Indicated  Transportation Interventions Intervention Not Indicated       TOC Interventions Today    Flowsheet Row Most Recent Value  TOC Interventions   TOC Interventions Discussed/Reviewed TOC Interventions Discussed, S/S of infection, Post discharge activity limitations per provider      Interventions Today    Flowsheet Row Most Recent Value  General Interventions   General Interventions Discussed/Reviewed General Interventions Discussed, Doctor Visits, Durable Medical Equipment (DME), Referral to Nurse  [follow up appt scheduled with RN care coordinator]  Doctor Visits Discussed/Reviewed Doctor Visits Discussed, Specialist, PCP  Durable Medical Equipment (DME) Other  [facility] line-[facility]-pt states wife is administering abxs as ordered-no issues noted]  PCP/Specialist Visits Compliance with follow-up visit  Education Interventions   Education Provided Provided Education  Provided Verbal Education On When to see the doctor, Nutrition, Medication  Nutrition Interventions   Nutrition Discussed/Reviewed Nutrition Discussed, Adding fruits and vegetables, Increaing proteins, Decreasing salt  Pharmacy Interventions   Pharmacy Dicussed/Reviewed Pharmacy Topics Discussed, Medications and their functions  Safety Interventions   Safety Discussed/Reviewed Safety Discussed, Fall Risk, Home Safety  Home Safety Assistive Devices  [pt using walker-confirms he is working with PT and OT-OT coming to visit today]        Antionette Fairy, Cathrine Muster [facility] Care Management Care Management Community Coordinator Direct Phone: [phone] Toll Free: [phone] Fax: [phone]

## 2022-06-25 DIAGNOSIS — I4892 Unspecified atrial flutter: Secondary | ICD-10-CM | POA: Diagnosis not present

## 2022-06-25 DIAGNOSIS — I5032 Chronic diastolic (congestive) heart failure: Secondary | ICD-10-CM | POA: Diagnosis not present

## 2022-06-25 DIAGNOSIS — I214 Non-ST elevation (NSTEMI) myocardial infarction: Secondary | ICD-10-CM | POA: Diagnosis not present

## 2022-06-25 DIAGNOSIS — Z48812 Encounter for surgical aftercare following surgery on the circulatory system: Secondary | ICD-10-CM | POA: Diagnosis not present

## 2022-06-25 DIAGNOSIS — T826XXA Infection and inflammatory reaction due to cardiac valve prosthesis, initial encounter: Secondary | ICD-10-CM | POA: Diagnosis not present

## 2022-06-25 DIAGNOSIS — I38 Endocarditis, valve unspecified: Secondary | ICD-10-CM | POA: Diagnosis not present

## 2022-06-25 DIAGNOSIS — I330 Acute and subacute infective endocarditis: Secondary | ICD-10-CM | POA: Diagnosis not present

## 2022-06-25 DIAGNOSIS — I2510 Atherosclerotic heart disease of native coronary artery without angina pectoris: Secondary | ICD-10-CM | POA: Diagnosis not present

## 2022-06-25 DIAGNOSIS — I480 Paroxysmal atrial fibrillation: Secondary | ICD-10-CM | POA: Diagnosis not present

## 2022-06-25 DIAGNOSIS — I442 Atrioventricular block, complete: Secondary | ICD-10-CM | POA: Diagnosis not present

## 2022-06-28 ENCOUNTER — Other Ambulatory Visit (HOSPITAL_COMMUNITY): Payer: Self-pay | Admitting: Cardiology

## 2022-06-28 DIAGNOSIS — I5032 Chronic diastolic (congestive) heart failure: Secondary | ICD-10-CM | POA: Diagnosis not present

## 2022-06-28 DIAGNOSIS — I480 Paroxysmal atrial fibrillation: Secondary | ICD-10-CM | POA: Diagnosis not present

## 2022-06-28 DIAGNOSIS — I330 Acute and subacute infective endocarditis: Secondary | ICD-10-CM | POA: Diagnosis not present

## 2022-06-28 DIAGNOSIS — I4892 Unspecified atrial flutter: Secondary | ICD-10-CM | POA: Diagnosis not present

## 2022-06-28 DIAGNOSIS — I38 Endocarditis, valve unspecified: Secondary | ICD-10-CM | POA: Diagnosis not present

## 2022-06-28 DIAGNOSIS — I214 Non-ST elevation (NSTEMI) myocardial infarction: Secondary | ICD-10-CM | POA: Diagnosis not present

## 2022-06-28 DIAGNOSIS — Z48812 Encounter for surgical aftercare following surgery on the circulatory system: Secondary | ICD-10-CM | POA: Diagnosis not present

## 2022-06-28 DIAGNOSIS — T826XXA Infection and inflammatory reaction due to cardiac valve prosthesis, initial encounter: Secondary | ICD-10-CM | POA: Diagnosis not present

## 2022-06-28 DIAGNOSIS — I2510 Atherosclerotic heart disease of native coronary artery without angina pectoris: Secondary | ICD-10-CM | POA: Diagnosis not present

## 2022-06-28 DIAGNOSIS — I442 Atrioventricular block, complete: Secondary | ICD-10-CM | POA: Diagnosis not present

## 2022-06-29 ENCOUNTER — Other Ambulatory Visit: Payer: Self-pay | Admitting: Cardiovascular Disease

## 2022-06-29 DIAGNOSIS — I442 Atrioventricular block, complete: Secondary | ICD-10-CM | POA: Diagnosis not present

## 2022-06-29 DIAGNOSIS — I214 Non-ST elevation (NSTEMI) myocardial infarction: Secondary | ICD-10-CM | POA: Diagnosis not present

## 2022-06-29 DIAGNOSIS — I5032 Chronic diastolic (congestive) heart failure: Secondary | ICD-10-CM | POA: Diagnosis not present

## 2022-06-29 DIAGNOSIS — I2510 Atherosclerotic heart disease of native coronary artery without angina pectoris: Secondary | ICD-10-CM | POA: Diagnosis not present

## 2022-06-29 DIAGNOSIS — I330 Acute and subacute infective endocarditis: Secondary | ICD-10-CM | POA: Diagnosis not present

## 2022-06-29 DIAGNOSIS — I480 Paroxysmal atrial fibrillation: Secondary | ICD-10-CM | POA: Diagnosis not present

## 2022-06-29 DIAGNOSIS — I4892 Unspecified atrial flutter: Secondary | ICD-10-CM | POA: Diagnosis not present

## 2022-06-29 DIAGNOSIS — T826XXA Infection and inflammatory reaction due to cardiac valve prosthesis, initial encounter: Secondary | ICD-10-CM | POA: Diagnosis not present

## 2022-06-29 DIAGNOSIS — Z48812 Encounter for surgical aftercare following surgery on the circulatory system: Secondary | ICD-10-CM | POA: Diagnosis not present

## 2022-06-30 ENCOUNTER — Telehealth: Payer: Self-pay

## 2022-06-30 NOTE — Telephone Encounter (Signed)
Pt's last INR was 05/12/22, pt is overdue for follow-up, but has been in hospital several times since last anticoagulation visit.  Upon reviewing his most recent hospitalization discharge note pt was discharged on Eliquis.  We received a refill request for Warfarin yesterday from [facility] where his Eliquis rx was also filled.  Called pt to verify he is currently on Eliquis and is not taking Warfarin.  Pt confirmed he is off Warfarin and on Eliquis at the present.  Called [facility] spoke with Raynelle Fanning pharmacist, advised pt is no longer on Warfarin and it is not to be refilled and needs to be discontinued from pt's medication list. Confirmed they do have active rx on pt's profile for Eliquis.

## 2022-07-01 ENCOUNTER — Encounter: Payer: Self-pay | Admitting: Medical

## 2022-07-01 ENCOUNTER — Telehealth (INDEPENDENT_AMBULATORY_CARE_PROVIDER_SITE_OTHER): Payer: Medicare HMO | Admitting: Medical

## 2022-07-01 VITALS — HR 95 | Wt 207.0 lb

## 2022-07-01 DIAGNOSIS — I5032 Chronic diastolic (congestive) heart failure: Secondary | ICD-10-CM | POA: Diagnosis not present

## 2022-07-01 DIAGNOSIS — I480 Paroxysmal atrial fibrillation: Secondary | ICD-10-CM | POA: Diagnosis not present

## 2022-07-01 DIAGNOSIS — I998 Other disorder of circulatory system: Secondary | ICD-10-CM

## 2022-07-01 DIAGNOSIS — I2510 Atherosclerotic heart disease of native coronary artery without angina pectoris: Secondary | ICD-10-CM | POA: Diagnosis not present

## 2022-07-01 DIAGNOSIS — Z952 Presence of prosthetic heart valve: Secondary | ICD-10-CM

## 2022-07-01 DIAGNOSIS — Z950 Presence of cardiac pacemaker: Secondary | ICD-10-CM | POA: Diagnosis not present

## 2022-07-01 DIAGNOSIS — B952 Enterococcus as the cause of diseases classified elsewhere: Secondary | ICD-10-CM | POA: Diagnosis not present

## 2022-07-01 DIAGNOSIS — R7881 Bacteremia: Secondary | ICD-10-CM

## 2022-07-01 DIAGNOSIS — Z951 Presence of aortocoronary bypass graft: Secondary | ICD-10-CM | POA: Diagnosis not present

## 2022-07-01 NOTE — Progress Notes (Signed)
Subjective:     Patient ID: [name], male   DOB: Sep 15, 1950, 72 y.o.   MRN: [id number]  This visit type was conducted due to national recommendations for restrictions regarding the COVID-19 Pandemic (e.g. social distancing) in an effort to limit this patient's exposure and mitigate transmission in our community.  Due to their co-morbid illnesses, this patient is at least at moderate risk for complications without adequate follow up.  This format is felt to be most appropriate for this patient at this time.    Documentation for virtual audio and video telecommunications through Central encounter:  The patient was located at home. The provider was located in the office. The patient did consent to this visit and is aware of possible charges through their insurance for this visit.  The other persons participating in this telemedicine service were none. Time spent on call was 20 minutes and in review of previous records 20 minutes total.  This virtual service is not related to other E/M service within previous 7 days.   HPI Chief Complaint  Patient presents with   Hospitalization Follow-up    Admitted to [facility] on 06/15/2022 - 06/22/2022 (7 days).    Admitted 06/15/22 - 06/22/22 to [facility].  He was admitted for left hand numbness and pain, infection in the bloodstream, endocarditis, blood flow problem in the left arm, stroke.  Overall today he is doing okay since hospital discharge  Home health has been out already for physical therapy, nursing and Occupational Therapy.  He had some blood work drawn last week.  He was already planning colonoscopy but has since called gastroenterology to postpone this in light of his recent hospitalization and recovery  He has no new complaints today.  He is compliant with his medications.  He has a history of atrial fibrillation, peripheral arterial disease, coronary artery disease status post CABG, CHB status post PPM,  aortic valve replacement with a bioprosthetic valve, mitral valve replacement with bioprosthetic valve, hyperlipidemia, prostate cancer, that presented to the hospital with left hand numbness and pain.  He underwent evaluation with vascular surgery and was found to have left upper extremity ischemia with CTA evidence of acute occlusion of the left proximal brachial artery at the level of the left humeral neck.  He underwent thrombosed embolectomy via brachial radial artery approach on 06/15/2022  He had a evaluation in April 2024 as well prior to this hospitalization presenting with shortness of breath and had elevated troponin and BNP.  Had cardiac cath on 05/25/2022 showing severe mid left anterior descending stenosis.  His symptoms are felt to be due to severe tricuspid regurgitation and was referred to outpatient heart failure clinic.  He was changed from Coumadin to Eliquis.  Due to left brachial artery occlusion cardiology was consulted to evaluate for possible cardiac source of embolus.  He had a TEE on 06/16/2022 showing a large vegetation involving the mitral valve and small subcentimeter vegetation involving aortic valve.    Blood cultures were positive with E faecalis and 2/3 bottles.  He was started on antibiotics on admission and was transitioned to ampicillin and Rocephin after blood cultures resulted.  He was treated for bacteremia due to Enterococcus bacteria, he was continue with ampicillin and ceftriaxone x 6 weeks and then changed to suppressive amoxicillin 500 mg twice a day after that lifelong starting 06/18/22.    Endocarditits - repeat echo advised in 2 weeks.  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  CVA - scattered punctate acute to subacute bilat cerebral infarcts primarily involving white matter noted on brain MRI 06/16/22, suspected cardiac embolic source, continue eliquis.    Past Medical History:  Diagnosis Date   BCE (basal cell  epithelioma), arm    RIGHT SHOULDER   Bradycardia    Bumps on skin    on left side of nose for last 3 weeks   CAD (coronary artery disease)    a. s/p CABGx2 10/11/18 LIMA to LAD, SVG to RCA, EVH via right thigh.   Dyslipidemia    Ectatic thoracic aorta (HCC)    Esophageal stricture    GERD (gastroesophageal reflux disease)    Heart murmur    sees dr Rose Fillers   Hyperlipidemia    Incidental pulmonary nodule, > 3mm and < 8mm 10/04/2018   Noted on CTA   Inguinal hernia    bilateral   Obesity    Presence of permanent cardiac pacemaker    Prostate cancer (HCC)    S/P aortic valve replacement with bioprosthetic valve 10/11/2018   23 mm Edwards Inspiris Resilia stented bovine pericardial tissue valve, miltral valve done also   S/P CABG x 2 10/11/2018   LIMA to LAD, SVG to RCA, EVH via right thigh   S/P mitral valve replacement with bioprosthetic valve 10/11/2018   27 mm Medtronic Mosaic stented porcine bioprosthetic tissue valve   S/P patent foramen ovale closure 10/11/2018   S/P placement of cardiac pacemaker    Smoker    Current Outpatient Medications on File Prior to Visit  Medication Sig Dispense Refill   acetaminophen (TYLENOL) 325 MG tablet Take 2 tablets (650 mg total) by mouth every 4 (four) hours as needed for moderate pain.     ampicillin IVPB Inject 12 g into the vein daily. As a continuous infusion. Indication:  Enterococcal endocarditis  First Dose: Yes Last Day of Therapy:  07/30/22 Labs - Once weekly:  CBC/D and BMP, Labs - Every other week:  ESR and CRP Method of administration: Ambulatory Pump (Continuous Infusion) Method of administration may be changed at the discretion of home infusion pharmacist based upon assessment of the patient and/or caregiver's ability to self-administer the medication ordered. 39 Units 0   apixaban (ELIQUIS) 5 MG TABS tablet Take 1 tablet (5 mg total) by mouth 2 (two) times daily. 60 tablet 1   atorvastatin (LIPITOR) 20 MG tablet Take 1 tablet  (20 mg total) by mouth daily. 90 tablet 3   cefTRIAXone (ROCEPHIN) IVPB Inject 2 g into the vein every 12 (twelve) hours. Indication:  Enterococcal endocarditis  First Dose: Yes Last Day of Therapy:  07/30/22 Labs - Once weekly:  CBC/D and BMP, Labs - Every other week:  ESR and CRP Method of administration: IV Push Method of administration may be changed at the discretion of home infusion pharmacist based upon assessment of the patient and/or caregiver's ability to self-administer the medication ordered. 78 Units 0   digoxin (LANOXIN) 0.125 MG tablet Take 1 tablet (0.125 mg total) by mouth daily. 90 tablet 3   empagliflozin (JARDIANCE) 10 MG TABS tablet Take 1 tablet (10 mg total) by mouth daily before breakfast. 30 tablet 11   furosemide (LASIX) 20 MG tablet Take 20 mg by mouth 2 (two) times daily.     Iron, Ferrous Sulfate, 325 (65 Fe) MG TABS Take 1 tablet by mouth 2 (two) times daily. (Patient taking differently: Take 1 tablet by mouth daily.) 60 tablet 1   metoprolol  tartrate (LOPRESSOR) 25 MG tablet Take 1.5 tablets (37.5 mg total) by mouth 2 (two) times daily. 180 tablet 3   oxymetazoline (AFRIN) 0.05 % nasal spray Place 1 spray into both nostrils at bedtime as needed for congestion.     polyethylene glycol powder (GLYCOLAX/MIRALAX) 17 GM/SCOOP powder Take 17 g by mouth daily as needed for mild constipation. 238 g 0   No current facility-administered medications on file prior to visit.     Review of Systems As in subjective    Objective:   Physical Exam Due to coronavirus pandemic stay at home measures, patient visit was virtual and they were not examined in person.   Pulse 95   Wt 207 lb (93.9 kg)   SpO2 98%   BMI 32.42 kg/m   Wt Readings from Last 3 Encounters:  07/01/22 207 lb (93.9 kg)  06/15/22 209 lb (94.8 kg)  06/09/22 209 lb (94.8 kg)   Gen: wd, wn, nad Seated in chair at times, walking across  room with a walker at other times No obvious wheezing or labored  breathing      Assessment:     Encounter Diagnoses  Name Primary?   Bacteremia due to Enterococcus Yes   Ischemia of left upper extremity    History of mitral valve replacement    Coronary artery disease involving native coronary artery of native heart without angina pectoris    Chronic diastolic heart failure (HCC)    Pacemaker    Paroxysmal A-fib (HCC)    S/P CABG x 2        Plan:     I reviewed his discharge summary, imaging and labs.  He was treated for bacteremia due to Enterococcus bacteria, he was continue with ampicillin and ceftriaxone x 6 weeks and then changed to suppressive amoxicillin 500 mg twice a day after that lifelong starting 06/18/22.  Follow up with infectious disease specialist on 07/12/2022 with Dr. Danelle Earthly  Endocarditis - repeat echo advised in 2 weeks.  He has follow-up planned on Jul 07, 2022 with cardiovascular specialist.  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  He is compliant with eliquis, understands risk/benefits of medication  CVA - scattered punctate acute to subacute bilat cerebral infarcts primarily involving white matter noted on brain MRI 06/16/22, suspected cardiac embolic source, continue eliquis.  No new symptoms.  Continue current medications.  Follow up with cardiology as planned.  Continue with home health nursing, physical therapy and occupational Therapy, currently with [facility]  Because of the serious nature of his recent findings on hospitalization he has temporarily postponed his upcoming colonoscopy.  He has been in contact with gastroenterology.  Currently using a walker.    If any new MI or CVA symptoms as discussed, call 911  He already had recheck labs since discharge through home health.  We will try and request these labs.  [name] was seen today for hospitalization follow-up.  Diagnoses and all orders for this visit:  Bacteremia due to Enterococcus  Ischemia of left  upper extremity  History of mitral valve replacement  Coronary artery disease involving native coronary artery of native heart without angina pectoris  Chronic diastolic heart failure (HCC)  Pacemaker  Paroxysmal A-fib (HCC)  S/P CABG x 2    F/u with cardiology as planned, ID as planned, follow up here with Dr. Susann Givens in 3-4 weeks

## 2022-07-02 ENCOUNTER — Telehealth (HOSPITAL_COMMUNITY): Payer: Self-pay

## 2022-07-02 ENCOUNTER — Encounter: Payer: Self-pay | Admitting: Internal Medicine

## 2022-07-02 DIAGNOSIS — I480 Paroxysmal atrial fibrillation: Secondary | ICD-10-CM | POA: Diagnosis not present

## 2022-07-02 DIAGNOSIS — I2510 Atherosclerotic heart disease of native coronary artery without angina pectoris: Secondary | ICD-10-CM | POA: Diagnosis not present

## 2022-07-02 DIAGNOSIS — I5032 Chronic diastolic (congestive) heart failure: Secondary | ICD-10-CM | POA: Diagnosis not present

## 2022-07-02 DIAGNOSIS — Z48812 Encounter for surgical aftercare following surgery on the circulatory system: Secondary | ICD-10-CM | POA: Diagnosis not present

## 2022-07-02 DIAGNOSIS — I330 Acute and subacute infective endocarditis: Secondary | ICD-10-CM | POA: Diagnosis not present

## 2022-07-02 DIAGNOSIS — T826XXA Infection and inflammatory reaction due to cardiac valve prosthesis, initial encounter: Secondary | ICD-10-CM | POA: Diagnosis not present

## 2022-07-02 DIAGNOSIS — I442 Atrioventricular block, complete: Secondary | ICD-10-CM | POA: Diagnosis not present

## 2022-07-02 DIAGNOSIS — I214 Non-ST elevation (NSTEMI) myocardial infarction: Secondary | ICD-10-CM | POA: Diagnosis not present

## 2022-07-02 DIAGNOSIS — I4892 Unspecified atrial flutter: Secondary | ICD-10-CM | POA: Diagnosis not present

## 2022-07-02 MED ORDER — POTASSIUM CHLORIDE ER 10 MEQ PO TBCR: 10.0000 meq | EXTENDED_RELEASE_TABLET | Freq: Every day | ORAL | 1 refills | Status: DC

## 2022-07-02 NOTE — Telephone Encounter (Signed)
Spoke to patient about TEE scheduled for may 28@9am . Pt aware of nothing to eat or dink after midnight,hold Jardiance and lasix morning of procedure. Has transportation to and from procedure.

## 2022-07-02 NOTE — Telephone Encounter (Signed)
See mychart message pt was sent

## 2022-07-02 NOTE — Telephone Encounter (Signed)
I did receive labs from [facility].  Labs are stable, still anemic though.  Eventually he will need to see GI for that colonoscopy.  I have noticed that the potassium is still low.  Begin potassium Klor-Con 10 mEq daily.  This is likely coming from the Lasix and lowering the potassium.  Follow-up with Dr. Susann Givens in a few weeks for recheck

## 2022-07-03 DIAGNOSIS — R7881 Bacteremia: Secondary | ICD-10-CM | POA: Diagnosis not present

## 2022-07-06 ENCOUNTER — Ambulatory Visit (HOSPITAL_COMMUNITY)
Admission: RE | Admit: 2022-07-06 | Discharge: 2022-07-06 | Disposition: A | Discharge status: Home / Self Care | Payer: Medicare HMO | Source: Ambulatory Visit | Attending: Cardiology | Admitting: Cardiology

## 2022-07-06 DIAGNOSIS — T826XXA Infection and inflammatory reaction due to cardiac valve prosthesis, initial encounter: Secondary | ICD-10-CM | POA: Diagnosis not present

## 2022-07-06 DIAGNOSIS — Z952 Presence of prosthetic heart valve: Secondary | ICD-10-CM | POA: Insufficient documentation

## 2022-07-06 DIAGNOSIS — I330 Acute and subacute infective endocarditis: Secondary | ICD-10-CM

## 2022-07-06 DIAGNOSIS — T82857A Stenosis of cardiac prosthetic devices, implants and grafts, initial encounter: Secondary | ICD-10-CM | POA: Diagnosis not present

## 2022-07-06 DIAGNOSIS — I442 Atrioventricular block, complete: Secondary | ICD-10-CM | POA: Diagnosis not present

## 2022-07-06 DIAGNOSIS — Z48812 Encounter for surgical aftercare following surgery on the circulatory system: Secondary | ICD-10-CM | POA: Diagnosis not present

## 2022-07-06 DIAGNOSIS — Y718 Miscellaneous cardiovascular devices associated with adverse incidents, not elsewhere classified: Secondary | ICD-10-CM | POA: Diagnosis not present

## 2022-07-06 DIAGNOSIS — I4892 Unspecified atrial flutter: Secondary | ICD-10-CM | POA: Diagnosis not present

## 2022-07-06 DIAGNOSIS — Y838 Other surgical procedures as the cause of abnormal reaction of the patient, or of later complication, without mention of misadventure at the time of the procedure: Secondary | ICD-10-CM | POA: Diagnosis not present

## 2022-07-06 DIAGNOSIS — I2510 Atherosclerotic heart disease of native coronary artery without angina pectoris: Secondary | ICD-10-CM | POA: Diagnosis not present

## 2022-07-06 DIAGNOSIS — I214 Non-ST elevation (NSTEMI) myocardial infarction: Secondary | ICD-10-CM | POA: Diagnosis not present

## 2022-07-06 DIAGNOSIS — I5032 Chronic diastolic (congestive) heart failure: Secondary | ICD-10-CM | POA: Diagnosis not present

## 2022-07-06 DIAGNOSIS — I38 Endocarditis, valve unspecified: Secondary | ICD-10-CM | POA: Insufficient documentation

## 2022-07-06 DIAGNOSIS — I480 Paroxysmal atrial fibrillation: Secondary | ICD-10-CM | POA: Diagnosis not present

## 2022-07-06 LAB — ECHOCARDIOGRAM LIMITED: S' Lateral: 2.5 cm

## 2022-07-06 MED ORDER — PERFLUTREN LIPID MICROSPHERE
1.0000 mL | INTRAVENOUS | Status: AC | PRN
  Administered 2022-07-06: 2 mL via INTRAVENOUS

## 2022-07-07 ENCOUNTER — Ambulatory Visit: Payer: Self-pay

## 2022-07-07 DIAGNOSIS — I214 Non-ST elevation (NSTEMI) myocardial infarction: Secondary | ICD-10-CM

## 2022-07-07 DIAGNOSIS — I330 Acute and subacute infective endocarditis: Secondary | ICD-10-CM | POA: Diagnosis not present

## 2022-07-07 DIAGNOSIS — Z48812 Encounter for surgical aftercare following surgery on the circulatory system: Secondary | ICD-10-CM | POA: Diagnosis not present

## 2022-07-07 DIAGNOSIS — I5032 Chronic diastolic (congestive) heart failure: Secondary | ICD-10-CM | POA: Diagnosis not present

## 2022-07-07 DIAGNOSIS — I4892 Unspecified atrial flutter: Secondary | ICD-10-CM | POA: Diagnosis not present

## 2022-07-07 DIAGNOSIS — I480 Paroxysmal atrial fibrillation: Secondary | ICD-10-CM | POA: Diagnosis not present

## 2022-07-07 DIAGNOSIS — I2510 Atherosclerotic heart disease of native coronary artery without angina pectoris: Secondary | ICD-10-CM | POA: Diagnosis not present

## 2022-07-07 DIAGNOSIS — T826XXA Infection and inflammatory reaction due to cardiac valve prosthesis, initial encounter: Secondary | ICD-10-CM | POA: Diagnosis not present

## 2022-07-07 DIAGNOSIS — I442 Atrioventricular block, complete: Secondary | ICD-10-CM | POA: Diagnosis not present

## 2022-07-07 NOTE — Patient Instructions (Signed)
Visit Information  Thank you for taking time to visit with me today. Please don't hesitate to contact me if I can be of assistance to you.   Following are the goals we discussed today:   Goals Addressed             This Visit's Progress    RN Care Coordination Activities: further follow up needed       Care Coordination Interventions: Provided education on low sodium diet Provided education about placing scale on hard, flat surface Advised patient to weigh each morning after emptying bladder Discussed importance of daily weight and advised patient to weigh and record daily Discussed the importance of keeping all appointments with provider Advised patient to discuss recommendations for daily fluid intake  with provider Assessed social determinant of health barriers  Social Work referral for to assist with MOM's meals Pharmacy referral for to assist with cost of Jardiance and Eliquis        Our next appointment is by telephone on 07/22/22 at 09:30 AM  Please call the care guide team at [phone] if you need to cancel or reschedule your appointment.   If you are experiencing a Mental Health or Behavioral Health Crisis or need someone to talk to, please call 1-800-273-TALK (toll free, 24 hour hotline) go to [facility] [address], [place] [phone])  Patient verbalizes understanding of instructions and care plan provided today and agrees to view in MyChart. Active MyChart status and patient understanding of how to access instructions and care plan via MyChart confirmed with patient.     Delsa Sale, RN, BSN, CCM Care Management Coordinator [facility] Care Management Direct Phone: [phone]

## 2022-07-07 NOTE — Patient Outreach (Signed)
  Care Coordination   Initial Visit Note   07/07/2022 Name: [name] MRN: [id number] DOB: 1950/11/13  [name] is a 72 y.o. year old male who sees [name], MD for primary care. I spoke with  [name] and wife [name] by phone today.  What matters to the patients health and wellness today?  Patient would like to continue to work on getting stronger.    Goals Addressed             This Visit's Progress    RN Care Coordination Activities: further follow up needed       Care Coordination Interventions: Provided education on low sodium diet Provided education about placing scale on hard, flat surface Advised patient to weigh each morning after emptying bladder Discussed importance of daily weight and advised patient to weigh and record daily Discussed the importance of keeping all appointments with provider Advised patient to discuss recommendations for daily fluid intake  with provider Assessed social determinant of health barriers  Social Work referral for to assist with MOM's meals Pharmacy referral for to assist with cost of Jardiance and Eliquis    Interventions Today    Flowsheet Row Most Recent Value  Chronic Disease   Chronic disease during today's visit Congestive Heart Failure (CHF)  General Interventions   General Interventions Discussed/Reviewed General Interventions Discussed, General Interventions Reviewed, Labs, Doctor Visits, Communication with  Doctor Visits Discussed/Reviewed Doctor Visits Discussed, Doctor Visits Reviewed, PCP, Specialist  Communication with Social Work, Pharmacists  Exercise Interventions   Exercise Discussed/Reviewed Physical Activity, Exercise Reviewed, Exercise Discussed  Physical Activity Discussed/Reviewed Home Exercise Program (HEP), Physical Activity Reviewed, Physical Activity Discussed  Education Interventions   Education Provided Provided Education  Provided Verbal Education On Nutrition, Labs, When to  see the doctor, Medication, Exercise  Labs Reviewed Kidney Function  [Potassium]  Nutrition Interventions   Nutrition Discussed/Reviewed Nutrition Discussed, Nutrition Reviewed, Fluid intake, Decreasing salt  [referral for MOM's meals]  Pharmacy Interventions   Pharmacy Dicussed/Reviewed Pharmacy Topics Reviewed, Pharmacy Topics Discussed, Medications and their functions          SDOH assessments and interventions completed:  No     Care Coordination Interventions:  Yes, provided   Follow up plan: Referral made to SW Bevelyn Ngo BSW to assist with MOM's meals; Pharmacy referral to assist with cost of Jardiance and Eliquis Follow up call scheduled for 07/22/22 @09 :30 AM    Encounter Outcome:  Pt. Visit Completed

## 2022-07-08 ENCOUNTER — Telehealth: Payer: Self-pay

## 2022-07-08 LAB — LAB REPORT - SCANNED: EGFR: 59

## 2022-07-08 NOTE — Patient Outreach (Signed)
  Care Coordination   07/08/2022 Name: DIRCK KEVORKIAN MRN: [id number] DOB: [birth year]   Care Coordination Outreach Attempts:  SW placed an unsuccessful outbound call to the patients spouse to follow up on a referral received for meal support. SW left a HIPAA compliant voice message requesting a return call.  Follow Up Plan:  Additional outreach attempts will be made to offer the patient care coordination information and services.   Encounter Outcome:  No Answer   Care Coordination Interventions:  No, not indicated    Bevelyn Ngo, BSW, CDP Social Worker, Certified Dementia Practitioner [facility] Care Management  Care Coordination [phone]

## 2022-07-10 DIAGNOSIS — R7881 Bacteremia: Secondary | ICD-10-CM | POA: Diagnosis not present

## 2022-07-12 ENCOUNTER — Telehealth: Payer: Self-pay

## 2022-07-12 ENCOUNTER — Encounter: Payer: Self-pay | Admitting: Internal Medicine

## 2022-07-12 ENCOUNTER — Ambulatory Visit (INDEPENDENT_AMBULATORY_CARE_PROVIDER_SITE_OTHER): Payer: Medicare HMO | Admitting: Internal Medicine

## 2022-07-12 ENCOUNTER — Ambulatory Visit: Payer: Self-pay

## 2022-07-12 ENCOUNTER — Other Ambulatory Visit: Payer: Self-pay

## 2022-07-12 VITALS — BP 132/88 | HR 94 | Temp 97.6°F | Resp 16 | Ht 67.0 in | Wt 214.0 lb

## 2022-07-12 DIAGNOSIS — I330 Acute and subacute infective endocarditis: Secondary | ICD-10-CM | POA: Diagnosis not present

## 2022-07-12 DIAGNOSIS — I442 Atrioventricular block, complete: Secondary | ICD-10-CM | POA: Diagnosis not present

## 2022-07-12 DIAGNOSIS — I480 Paroxysmal atrial fibrillation: Secondary | ICD-10-CM | POA: Diagnosis not present

## 2022-07-12 DIAGNOSIS — I4892 Unspecified atrial flutter: Secondary | ICD-10-CM | POA: Diagnosis not present

## 2022-07-12 DIAGNOSIS — I214 Non-ST elevation (NSTEMI) myocardial infarction: Secondary | ICD-10-CM | POA: Diagnosis not present

## 2022-07-12 DIAGNOSIS — T826XXA Infection and inflammatory reaction due to cardiac valve prosthesis, initial encounter: Secondary | ICD-10-CM | POA: Diagnosis not present

## 2022-07-12 DIAGNOSIS — I2510 Atherosclerotic heart disease of native coronary artery without angina pectoris: Secondary | ICD-10-CM | POA: Diagnosis not present

## 2022-07-12 DIAGNOSIS — I38 Endocarditis, valve unspecified: Secondary | ICD-10-CM | POA: Diagnosis not present

## 2022-07-12 DIAGNOSIS — I5032 Chronic diastolic (congestive) heart failure: Secondary | ICD-10-CM | POA: Diagnosis not present

## 2022-07-12 DIAGNOSIS — Z48812 Encounter for surgical aftercare following surgery on the circulatory system: Secondary | ICD-10-CM | POA: Diagnosis not present

## 2022-07-12 MED ORDER — AMOXICILLIN 500 MG PO TABS: 500.0000 mg | ORAL_TABLET | Freq: Two times a day (BID) | ORAL | 5 refills | Status: DC

## 2022-07-12 NOTE — Progress Notes (Unsigned)
Patient Active Problem List   Diagnosis Date Noted   Chronic diastolic heart failure (HCC) 06/18/2022   Endocarditis 06/17/2022   Bacteremia due to Enterococcus 06/17/2022   Acute CVA (cerebrovascular accident) (HCC) 06/17/2022   Ischemia of left upper extremity 06/15/2022   Arterial occlusion 06/15/2022   Brachial artery occlusion, left (HCC) 06/15/2022   Paroxysmal A-fib (HCC) 06/15/2022   Secondary hypercoagulable state (HCC) 06/15/2022   History of mitral valve repair 06/15/2022   Demand ischemia 05/25/2022   NSTEMI (non-ST elevated myocardial infarction) (HCC) 05/23/2022   Senile purpura (HCC) 05/18/2022   Atrial flutter (HCC) 09/02/2021   Atherosclerosis of aorta (HCC) 04/30/2019   Actinic keratosis 04/30/2019   Malignant neoplasm of prostate (HCC) 02/06/2019   Complete heart block (HCC) 01/18/2019   Pacemaker 01/18/2019   Encounter for therapeutic drug monitoring 10/23/2018   H/O aortic valve replacement 10/11/2018   S/P CABG x 2 10/11/2018   History of mitral valve replacement 10/11/2018   Incidental pulmonary nodule, > 3mm and < 8mm 10/04/2018   Coronary artery disease involving native coronary artery of native heart without angina pectoris    Mixed hyperlipidemia 06/15/2017   Hypogonadism in male 11/05/2015   Former smoker 12/27/2011   Obesity (BMI 30-39.9) 12/27/2011   History of esophageal stricture 12/27/2011    Patient's Medications  New Prescriptions   No medications on file  Previous Medications   ACETAMINOPHEN (TYLENOL) 325 MG TABLET    Take 2 tablets (650 mg total) by mouth every 4 (four) hours as needed for moderate pain.   AMPICILLIN IVPB    Inject 12 g into the vein daily. As a continuous infusion. Indication:  Enterococcal endocarditis  First Dose: Yes Last Day of Therapy:  07/30/22 Labs - Once weekly:  CBC/D and BMP, Labs - Every other week:  ESR and CRP Method of administration: Ambulatory Pump (Continuous Infusion) Method of  administration may be changed at the discretion of home infusion pharmacist based upon assessment of the patient and/or caregiver's ability to self-administer the medication ordered.   APIXABAN (ELIQUIS) 5 MG TABS TABLET    Take 1 tablet (5 mg total) by mouth 2 (two) times daily.   ATORVASTATIN (LIPITOR) 20 MG TABLET    Take 1 tablet (20 mg total) by mouth daily.   CEFTRIAXONE (ROCEPHIN) IVPB    Inject 2 g into the vein every 12 (twelve) hours. Indication:  Enterococcal endocarditis  First Dose: Yes Last Day of Therapy:  07/30/22 Labs - Once weekly:  CBC/D and BMP, Labs - Every other week:  ESR and CRP Method of administration: IV Push Method of administration may be changed at the discretion of home infusion pharmacist based upon assessment of the patient and/or caregiver's ability to self-administer the medication ordered.   DIGOXIN (LANOXIN) 0.125 MG TABLET    Take 1 tablet (0.125 mg total) by mouth daily.   EMPAGLIFLOZIN (JARDIANCE) 10 MG TABS TABLET    Take 1 tablet (10 mg total) by mouth daily before breakfast.   FUROSEMIDE (LASIX) 20 MG TABLET    Take 20 mg by mouth 2 (two) times daily.   IRON, FERROUS SULFATE, 325 (65 FE) MG TABS    Take 1 tablet by mouth 2 (two) times daily.   METOPROLOL TARTRATE (LOPRESSOR) 25 MG TABLET    Take 1.5 tablets (37.5 mg total) by mouth 2 (two) times daily.   OXYMETAZOLINE (AFRIN) 0.05 % NASAL SPRAY    Place 1 spray into  both nostrils at bedtime as needed for congestion.   POLYETHYLENE GLYCOL POWDER (GLYCOLAX/MIRALAX) 17 GM/SCOOP POWDER    Take 17 g by mouth daily as needed for mild constipation.   POTASSIUM CHLORIDE (KLOR-CON) 10 MEQ TABLET    Take 1 tablet (10 mEq total) by mouth daily.  Modified Medications   No medications on file  Discontinued Medications   AMPICILLIN SODIUM 10 G SOLR       CEFTRIAXONE (ROCEPHIN) 2 G INJECTION        Subjective: 72 YM with PMHX as below and 72 year old male with bioprosthetic AVR and by bioprosthetic MVR admitted  with acute thrombus to left lower extremity requiring urgent surgery presents for follow-up on E faecalis bacteremia . Discharged on IV abc x 6 weeks.    Today 07/12/22: Doing well tolerating abx. Deny fevers. States takin yogurt with abx.  Review of Systems: Review of Systems  All other systems reviewed and are negative.   Past Medical History:  Diagnosis Date   BCE (basal cell epithelioma), arm    RIGHT SHOULDER   Bradycardia    Bumps on skin    on left side of nose for last 3 weeks   CAD (coronary artery disease)    a. s/p CABGx2 10/11/18 LIMA to LAD, SVG to RCA, EVH via right thigh.   Dyslipidemia    Ectatic thoracic aorta (HCC)    Esophageal stricture    GERD (gastroesophageal reflux disease)    Heart murmur    sees dr Rose Fillers   Hyperlipidemia    Incidental pulmonary nodule, > 3mm and < 8mm 10/04/2018   Noted on CTA   Inguinal hernia    bilateral   Obesity    Presence of permanent cardiac pacemaker    Prostate cancer (HCC)    S/P aortic valve replacement with bioprosthetic valve 10/11/2018   23 mm Edwards Inspiris Resilia stented bovine pericardial tissue valve, miltral valve done also   S/P CABG x 2 10/11/2018   LIMA to LAD, SVG to RCA, EVH via right thigh   S/P mitral valve replacement with bioprosthetic valve 10/11/2018   27 mm Medtronic Mosaic stented porcine bioprosthetic tissue valve   S/P patent foramen ovale closure 10/11/2018   S/P placement of cardiac pacemaker    Smoker     Social History   Tobacco Use   Smoking status: Former    Packs/day: 0.25    Years: 35.00    Additional pack years: 0.00    Total pack years: 8.75    Types: Cigarettes    Quit date: 09/04/2018    Years since quitting: 3.8   Smokeless tobacco: Never  Vaping Use   Vaping Use: Never used  Substance Use Topics   Alcohol use: Yes    Alcohol/week: 1.0 standard drink of alcohol    Types: 1 Cans of beer per week    Comment: rare   Drug use: No    Family History  Problem Relation Age  of Onset   Colon cancer Mother    Lung cancer Father        smoker   CAD Neg Hx    Pancreatic cancer Neg Hx    Breast cancer Neg Hx     No Known Allergies  Health Maintenance  Topic Date Due   COVID-19 Vaccine (4 - 2023-24 season) 07/28/2022 (Originally 10/09/2021)   Pneumonia Vaccine 80+ Years old (3 of 3 - PPSV23 or PCV20) 07/12/2023 (Originally 11/04/2020)   INFLUENZA VACCINE  09/09/2022  Medicare Annual Wellness (AWV)  05/11/2023   Colonoscopy  06/02/2023   Hepatitis C Screening  Completed   Zoster Vaccines- Shingrix  Completed   HPV VACCINES  Aged Out   DTaP/Tdap/Td  Discontinued    Objective:  Vitals:   07/12/22 0855  BP: 132/88  Pulse: 94  Resp: 16  Temp: 97.6 F (36.4 C)  TempSrc: Temporal  SpO2: 97%  Weight: 214 lb (97.1 kg)  Height: 5\' 7"  (1.702 m)   Body mass index is 33.52 kg/m.  Physical Exam Constitutional:      General: He is not in acute distress.    Appearance: He is normal weight. He is not toxic-appearing.  HENT:     Head: Normocephalic and atraumatic.     Right Ear: External ear normal.     Left Ear: External ear normal.     Nose: No congestion or rhinorrhea.     Mouth/Throat:     Mouth: Mucous membranes are moist.     Pharynx: Oropharynx is clear.  Eyes:     Extraocular Movements: Extraocular movements intact.     Conjunctiva/sclera: Conjunctivae normal.     Pupils: Pupils are equal, round, and reactive to light.  Cardiovascular:     Rate and Rhythm: Normal rate and regular rhythm.     Heart sounds: No murmur heard.    No friction rub. No gallop.  Pulmonary:     Effort: Pulmonary effort is normal.     Breath sounds: Normal breath sounds.  Abdominal:     General: Abdomen is flat. Bowel sounds are normal.     Palpations: Abdomen is soft.  Musculoskeletal:        General: No swelling. Normal range of motion.     Cervical back: Normal range of motion and neck supple.  Skin:    General: Skin is warm and dry.  Neurological:      General: No focal deficit present.     Mental Status: He is oriented to person, place, and time.  Psychiatric:        Mood and Affect: Mood normal.     Lab Results Lab Results  Component Value Date   WBC 10.0 06/22/2022   HGB 9.8 (L) 06/22/2022   HCT 32.7 (L) 06/22/2022   MCV 84.5 06/22/2022   PLT 102 (L) 06/22/2022    Lab Results  Component Value Date   CREATININE 0.86 06/22/2022   BUN 10 06/22/2022   NA 134 (L) 06/22/2022   K 3.3 (L) 06/22/2022   CL 98 06/22/2022   CO2 24 06/22/2022    Lab Results  Component Value Date   ALT 14 06/15/2022   AST 25 06/15/2022   ALKPHOS 74 06/15/2022   BILITOT 1.7 (H) 06/15/2022    Lab Results  Component Value Date   CHOL 77 05/25/2022   HDL 25 (L) 05/25/2022   LDLCALC 37 05/25/2022   TRIG 73 05/25/2022   CHOLHDL 3.1 05/25/2022   No results found for: "LABRPR", "RPRTITER" No results found for: "HIV1RNAQUANT", "HIV1RNAVL", "CD4TABS"   Problem List Items Addressed This Visit   None  Assessment/Plan #E faecalis bacteremia with bioprosthetic mitral valve and 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 Randie Heinz, vascular surgery. Vascular surgery following -TEE  showed large vegetation encompassing majority of mitral  valve leaflet, aortic bovine valve with small subcentimeter vegetation. -Seen by CTS Dr. Cliffton Asters, noted that given all his comorbidities the likelihood of subsequent surgical transfer limited.  Recommended medical management and antibiotic therapy at this point. -Discharged on Ampicillin 12 gm q24h continous infusion and ctx 2 gm q12h  x 6 weeks Plan: -TTE on 5/28 showed redemonstration of mitral valve prosthetic vegetation and LV chordal redundancy versus  likely vegetation.  Aortic valve prosthesis appears thickened with mobile components.Sees cardiolgy on 6/5.  F/U with ID on 6/13 following cardiology appointment.   EOT 6/20 of IV abx, Plan to switch to p.o. suppression amox 500mg  PO bid at the end of IV antibiotics treatment on 6/20. Needs colonoscopy  #Medication monting #PICC Esr.crp 30/56 on 06/28/22, wc 11.1 and scr 0.83   Danelle Earthly, MD [facility] Infectious Disease [facility] 07/12/2022, 9:09 AM   I have personally spent 47 minutes involved in face-to-face and non-face-to-face activities for this patient on the day of the visit. Professional time spent includes the following activities: Preparing to see the patient (review of tests), Obtaining and/or reviewing separately obtained history (admission/discharge record), Performing a medically appropriate examination and/or evaluation , Ordering medications/tests/procedures, referring and communicating with other health care professionals, Documenting clinical information in the EMR, Independently interpreting results (not separately reported), Communicating results to the patient/family/caregiver, Counseling and educating the patient/family/caregiver and Care coordination (not separately reported).

## 2022-07-12 NOTE — Progress Notes (Unsigned)
Care Management & Coordination Services Pharmacy Note  07/12/2022 Name:  [name] MRN:  [id number] DOB:  Nov 25, 1950  Summary: -BP at goal <130/80, having some fluid retention around ankles and feet, cardio aware and managing -Denies any signs or symptoms of a-fib  Recommendations/Changes made from today's visit: -Counseled on importance of routine BP monitoring and daily HR monitoring -Counseled to weigh daily to monitor for signs of fluid retention -Counseled on importance of limiting salt intake  Follow up plan: -BP call in 3-4 months -Pharmacist visit in 6 months   Subjective: [name] is an 72 y.o. year old male who is a primary patient of [name], MD.  The care coordination team was consulted for assistance with disease management and care coordination needs.    Engaged with patient face to face for initial visit. Did not bring medications to the appt (difficult to get around and carry things right now with IV abx). Walks with assistance of a walker and comes with his wife. Wife is a Merchandiser, retail for [employer] and has years of experience with medical treatment. Assists with patient's medications and requests to be point of contact for any health needs and phone call check-ins, with patient's consent given.  Recent office visits: 07/01/22 Tysinger, Kermit Balo, PA-C - Patient presented for Bacteremia due to enterococcus and other concerns. No medication changes.    05/18/22 [name], MD - Patient presented for routine general medical exam at a health care facility and other concerns. No medication changes.   05/11/21 [name], MD - Patient presented for pain of left calf and other concerns. Recommended Tylenol and heat.   05/11/22 Barb Merino, LPN - Patient presented for Medicare Annual Wellness exam. Patient voiced goal of feeling better. No medication changes.    05/05/22 [name], MD - Patient presented for weight loss and other concerns. Administered  Steroid injection.   03/24/22 [name], MD - Patient presented for Weakness and other concerns. No medication changes.   Recent consult visits: 07/07/22 Little, Karma Lew, RN - Patient presented for [facility] phone visit for Chronic diastolic heart failure and other concerns. No medication changes.    07/06/22 Patient presented to [facility] for Echocardiogram.    06/09/22 Patient presented to [facility] for Weakness and other concerns. Prescribed Metoprolol.   06/03/22 Dorthula Nettles, DO (Cardiology) - Patient presented for Atrial Flutter unspecified type and other concerns. Prescribed Digoxin. Prescribed Jardiance.   04/13/22 Hemphill, Verl Bangs, RN (Cardio) - Patient presented for Anti Coag visit reading was 3.6   02/19/22 Swaziland, Peter M (Cardiology) - Claims encounter for Therapeutic drug level monitoring and other concerns. No other visit details available.    02/03/22 Dunzweiler, Arline Asp - Claims encounter for Nummular dermatitis. No other visit details available.   Hospital visits: 06/15/22 [facility] - For ischemia of left upper extremity, LOS 7 days. START Ampicillin IVPB and Rocephin x 6 weeks. No other med changes  05/23/22 [facility] - For NSTEMI. START Eliquis, Lasix and Miralax. Stop amoxicillin and warfarin   Objective:  Lab Results  Component Value Date   CREATININE 0.86 06/22/2022   BUN 10 06/22/2022   EGFR 81 05/05/2022   GFRNONAA >60 06/22/2022   GFRAA >60 04/02/2019   NA 134 (L) 06/22/2022   K 3.3 (L) 06/22/2022   CALCIUM 8.1 (L) 06/22/2022   CO2 24 06/22/2022   GLUCOSE 100 (H) 06/22/2022  Lab Results  Component Value Date/Time   HGBA1C 5.8 (H) 10/09/2018 03:44 PM    Last diabetic Eye exam: No results found for: "HMDIABEYEEXA"  Last diabetic Foot exam: No results found for: "HMDIABFOOTEX"   Lab Results  Component Value Date   CHOL 77 05/25/2022   HDL 25 (L) 05/25/2022   LDLCALC 37  05/25/2022   TRIG 73 05/25/2022   CHOLHDL 3.1 05/25/2022       Latest Ref Rng & Units 06/15/2022   10:42 PM 06/15/2022    8:30 AM 06/09/2022    3:52 PM  Hepatic Function  Total Protein 6.5 - 8.1 g/dL 6.3  5.9  7.3   Albumin 3.5 - 5.0 g/dL 2.6  2.3  2.8   AST 15 - 41 U/L 25  26  25    ALT 0 - 44 U/L 14  12  15    Alk Phosphatase 38 - 126 U/L 74  73  81   Total Bilirubin 0.3 - 1.2 mg/dL 1.7  2.0  2.1     Lab Results  Component Value Date/Time   TSH 1.730 11/02/2018 11:42 AM       Latest Ref Rng & Units 06/22/2022    5:10 AM 06/21/2022    1:46 AM 06/20/2022    1:19 AM  CBC  WBC 4.0 - 10.5 K/uL 10.0  12.1  14.2   Hemoglobin 13.0 - 17.0 g/dL 9.8  9.5  16.1   Hematocrit 39.0 - 52.0 % 32.7  31.6  32.8   Platelets 150 - 400 K/uL 102  72  58     Lab Results  Component Value Date/Time   VITAMINB12 292 05/24/2022 08:26 AM    Clinical ASCVD: Yes  The ASCVD Risk score (Arnett DK, et al., 2019) failed to calculate for the following reasons:   The patient has a prior MI or stroke diagnosis       07/12/2022    8:55 AM 05/11/2022    3:32 PM 05/05/2022   12:02 PM  Depression screen PHQ 2/9  Decreased Interest 0 0 0  Down, Depressed, Hopeless 0 0 0  PHQ - 2 Score 0 0 0  Altered sleeping  0   Tired, decreased energy  0   Change in appetite  0   Feeling bad or failure about yourself   0   Trouble concentrating  0   Moving slowly or fidgety/restless  0   Suicidal thoughts  0   PHQ-9 Score  0   Difficult doing work/chores  Not difficult at all      Social History   Tobacco Use  Smoking Status Former   Packs/day: 0.25   Years: 35.00   Additional pack years: 0.00   Total pack years: 8.75   Types: Cigarettes   Quit date: 09/04/2018   Years since quitting: 3.8  Smokeless Tobacco Never   BP Readings from Last 3 Encounters:  07/12/22 132/88  06/22/22 138/74  06/09/22 110/80   Pulse Readings from Last 3 Encounters:  07/12/22 94  07/01/22 95  06/22/22 95   Wt Readings from  Last 3 Encounters:  07/12/22 214 lb (97.1 kg)  07/01/22 207 lb (93.9 kg)  06/15/22 209 lb (94.8 kg)   BMI Readings from Last 3 Encounters:  07/12/22 33.52 kg/m  07/01/22 32.42 kg/m  06/15/22 32.73 kg/m    No Known Allergies  Medications Reviewed Today     Reviewed by Letta Median, CMA (Certified Medical Assistant) on 07/12/22 at [phone]  Med List Status: <None>   Medication Order Taking? Sig Documenting Provider Last Dose Status Informant  acetaminophen (TYLENOL) 325 MG tablet [id number] Yes Take 2 tablets (650 mg total) by mouth every 4 (four) hours as needed for moderate pain. Lewie Chamber, MD Taking Active   ampicillin IVPB [id number] Yes Inject 12 g into the vein daily. As a continuous infusion. Indication:  Enterococcal endocarditis  First Dose: Yes Last Day of Therapy:  07/30/22 Labs - Once weekly:  CBC/D and BMP, Labs - Every other week:  ESR and CRP Method of administration: Ambulatory Pump (Continuous Infusion) Method of administration may be changed at the discretion of home infusion pharmacist based upon assessment of the patient and/or caregiver's ability to self-administer the medication ordered. Lewie Chamber, MD Taking Active   apixaban (ELIQUIS) 5 MG TABS tablet [id number] Yes Take 1 tablet (5 mg total) by mouth 2 (two) times daily. Kathlen Mody, MD Taking Active Self, Pharmacy Records, Spouse/Significant Other  atorvastatin (LIPITOR) 20 MG tablet [id number] Yes Take 1 tablet (20 mg total) by mouth daily. [name], MD Taking Active Self, Pharmacy Records, Spouse/Significant Other  cefTRIAXone (ROCEPHIN) IVPB [id number]  Inject 2 g into the vein every 12 (twelve) hours. Indication:  Enterococcal endocarditis  First Dose: Yes Last Day of Therapy:  07/30/22 Labs - Once weekly:  CBC/D and BMP, Labs - Every other week:  ESR and CRP Method of administration: IV Push Method of administration may be changed at the discretion of home infusion pharmacist based upon  assessment of the patient and/or caregiver's ability to self-administer the medication ordered. Lewie Chamber, MD  Active   digoxin (LANOXIN) 0.125 MG tablet [id number] Yes Take 1 tablet (0.125 mg total) by mouth daily. Dorthula Nettles, DO Taking Active Self, Pharmacy Records, Spouse/Significant Other  empagliflozin (JARDIANCE) 10 MG TABS tablet [id number] Yes Take 1 tablet (10 mg total) by mouth daily before breakfast. Dorthula Nettles, DO Taking Active Self, Pharmacy Records, Spouse/Significant Other  furosemide (LASIX) 20 MG tablet [id number] Yes Take 20 mg by mouth 2 (two) times daily. [provider] Taking Active Self, Pharmacy Records, Spouse/Significant Other  Iron, Ferrous Sulfate, 325 (65 Fe) MG TABS [id number] Yes Take 1 tablet by mouth 2 (two) times daily.  Patient taking differently: Take 1 tablet by mouth daily.   Kathlen Mody, MD Taking Active Self, Pharmacy Records, Spouse/Significant Other  metoprolol tartrate (LOPRESSOR) 25 MG tablet [id number] Yes Take 1.5 tablets (37.5 mg total) by mouth 2 (two) times daily. Milford, Anderson Malta, FNP Taking Active Self, Pharmacy Records, Spouse/Significant Other  oxymetazoline (AFRIN) 0.05 % nasal spray [id number] Yes Place 1 spray into both nostrils at bedtime as needed for congestion. [provider] Taking Active Self, Pharmacy Records, Spouse/Significant Other  polyethylene glycol powder (GLYCOLAX/MIRALAX) 17 GM/SCOOP powder [id number] Yes Take 17 g by mouth daily as needed for mild constipation. Kathlen Mody, MD Taking Active Self, Pharmacy Records, Spouse/Significant Other  potassium chloride (KLOR-CON) 10 MEQ tablet [id number] Yes Take 1 tablet (10 mEq total) by mouth daily. Tysinger, Kermit Balo, PA-C Taking Active             SDOH:  (Social Determinants of Health) assessments and interventions performed: Yes SDOH Interventions    Flowsheet Row Telephone from 06/24/2022 in [facility] Care Coordination  Telephone from 05/27/2022 in [facility] Care Coordination Clinical Support from 05/11/2022 in [facility]  SDOH Interventions     Food Insecurity Interventions Intervention Not Indicated Intervention Not Indicated Intervention Not  Indicated  Housing Interventions -- -- Intervention Not Indicated  Transportation Interventions Intervention Not Indicated Intervention Not Indicated Intervention Not Indicated  Utilities Interventions -- -- Intervention Not Indicated  Depression Interventions/Treatment  -- -- ZOX0-9 Score <4 Follow-up Not Indicated  Financial Strain Interventions -- -- Intervention Not Indicated  Physical Activity Interventions -- -- Patient Refused, Other (Comments)  Stress Interventions -- -- Intervention Not Indicated       Medication Assistance: Application for Jardiance healthwell fund pending.  Medication Access: Name and location of current pharmacy:  [facility] - [place], [place] - [address] [place] [address] Phone: [phone] Fax: [phone]  [facility] [address] [place] [address] Phone: [phone] Fax: [phone]  Within the past 30 days, how often has patient missed a dose of medication? None Is a pillbox or other method used to improve adherence? Yes  Factors that may affect medication adherence? no barriers identified Are meds synced by current pharmacy? No  Are meds delivered by current pharmacy? No  Does patient experience delays in picking up medications due to transportation concerns? No   Compliance/Adherence/Medication fill history: Care Gaps: AWV - 05/11/22 PNA Vaccine -  Postponed  Star-Rating Drugs: Atorvastatin 20 mg - Last filled 06/23/22 30 DS at [facility] Jardiance 10 mg - Last filled 07/01/22 30 DS at [facility]   Assessment/Plan Hyperlipidemia: (LDL goal < 70) -Not assessed today -Current  treatment: Atorvastatin 20mg  1 qd Appropriate, Effective, Safe, Accessible  Atrial Fibrillation (Goal: prevent stroke and major bleeding) -Controlled -CHADSVASC: 6 -Current treatment: Digoxin 0.125mg  1 qd Appropriate, Effective, Safe, Accessible Eliquis 5mg  1 tab BID Appropriate, Effective, Safe, Accessible Metoprolol tartrate 25mg  1.5 tabs BID Appropriate, Effective, Safe, Accessible -Medications previously tried: Warfarin -Home BP and HR readings: limited in BP checking right now with IV line placed in right arm and healing from left arm surgery. Nurse comes out once weekly and does check, reports WNL. Watch monitors HR  -Counseled on increased risk of stroke due to Afib and benefits of anticoagulation for stroke prevention; importance of adherence to anticoagulant exactly as prescribed; bleeding risk associated with Eliquis and importance of self-monitoring for signs/symptoms of bleeding; avoidance of NSAIDs due to increased bleeding risk with anticoagulants; importance of regular laboratory monitoring; seeking medical attention after a head injury or if there is blood in the urine/stool; -Recommended to continue current medication  Heart Failure (Goal: manage symptoms and prevent exacerbations) -Controlled -Last ejection fraction: 50-55% (Date: 07/06/22) -HF type: HFpEF (EF > 50%) -NYHA Class: III (marked limitation of activity) -AHA HF Stage: C (Heart disease and symptoms present) -Current treatment: Jardiance 10mg  1 qd Appropriate, Effective, Safe, Accessible Lasix 20mg  1 tab daily Appropriate, Effective, Safe, Accessible Klor-Con 1 tab qd (with Lasix) Appropriate, Effective, Safe, Accessible -Medications previously tried: None  -Current home BP/HR readings: see above -Current home daily weights: checking weight daily -Current dietary habits: mindful of salt intake -Current exercise habits: Not discussed -Educated on Benefits of medications for managing symptoms and  prolonging life Importance of weighing daily; if you gain more than 3 pounds in one day or 5 pounds in one week, contact cardio or PCP Proper diuretic administration and potassium supplementation Importance of blood pressure control -Counseled on diet and exercise extensively Recommended to continue current medication  Bacteremia (Goal: Eradication of bacterial infection and prevention of sepsis) -Not assessed today -Current treatment  Amoxicillin 500mg  1 BID - starting in 2 weeks Ampicillin  IVPB Appropriate, Effective, Safe, Accessible Ceftriaxone IVPB Appropriate, Effective, Safe, Accessible  OTC  -Current treatment  Iron 325mg  BID Appropriate, Effective, Safe, Accessible Afrin nasal spray prn Appropriate, Effective, Safe, Accessible  -Counseled on not using for more than 3-5 days consecutively Miralax prn constipation Appropriate, Effective, Safe, Accessible   Sherrill Raring Clinical Pharmacist [phone]

## 2022-07-12 NOTE — Progress Notes (Unsigned)
Patient ID: [name], male   DOB: 1950/03/30, 72 y.o.   MRN: [id number]  Care Management & Coordination Services Pharmacy Team  Reason for Encounter: Chart prep for initial encounter with Johny Drilling D on 07/15/22 at 10 am in office.   Have you seen any other providers since your last visit? **{YES NO:22349} Any changes in your medications or health? {YES NO:22349} Any side effects from any medications? {YES NO:22349} Do you have an symptoms or problems not managed by your medications? {YES NO:22349} Any concerns about your health right now? {YES NO:22349} Has your provider asked that you check blood pressure, blood sugar, or follow special diet at home? {YES NO:22349} Do you get any type of exercise on a regular basis? {YES NO:22349} Can you think of a goal you would like to reach for your health? *** Do you have any problems getting your medications? {YES NO:22349} Is there anything that you would like to discuss during the appointment? ***  Patient assistance for the following mediations  Patient aware to bring blood pressure cuff, medications that do not need refrigeration and supplements to appointment   Chart review:  Recent office visits:  07/01/22 Tysinger, Kermit Balo, PA-C - Patient presented for Bacteremia due to enterococcus and other concerns. No medication changes.   05/18/22 [name], MD - Patient presented for routine general medical exam at a health care facility and other concerns. No medication changes.  05/11/21 [name], MD - Patient presented for pain of left calf and other concerns. Recommended Tylenol and heat.  05/11/22 Barb Merino, LPN - Patient presented for Medicare Annual Wellness exam. Patient voiced goal of feeling better. No medication changes.   05/05/22 [name], MD - Patient presented for weight loss and other concerns. Administered Steroid injection.  03/24/22 [name], MD - Patient presented for Weakness and other  concerns. No medication changes.   Recent consult visits:  07/07/22 Little, Karma Lew, RN - Patient presented for [facility] phone visit for Chronic diastolic heart failure and other concerns. No medication changes.   07/06/22 Patient presented to [facility] for Echocardiogram.   06/09/22 Patient presented to [facility] for Weakness and other concerns. Prescribed Metoprolol.  06/03/22 Dorthula Nettles, DO (Cardiology) - Patient presented for Atrial Flutter unspecified type and other concerns. Prescribed Digoxin. Prescribed Jardiance.  04/13/22 Hemphill, Verl Bangs, RN (Cardio) - Patient presented for Anti Coag visit reading was 3.6  02/19/22 [place], Peter M (Cardiology) - Claims encounter for Therapeutic drug level monitoring and other concerns. No other visit details available.   02/03/22 Dunzweiler, Arline Asp - Claims encounter for Nummular dermatitis. No other visit details available.   Hospital visits:  Medication Reconciliation was completed by comparing discharge summary, patient's EMR and Pharmacy list, and upon discussion with patient.  Patient presented to [facility] on 06/15/22 due to Ischemia of left upper extremity. Patient was present for 7 days,  New?Medications Started at [facility] Discharge:?? -started  ampicillin IVPB cefTRIAXone IVPB (ROCEPHIN)  Medication Changes at Hospital Discharge: -Changed  acetaminophen (TYLENOL)  Medications Discontinued at Hospital Discharge: -Stopped  none  Medications that remain the same after Hospital Discharge:??  -All other medications will remain the same.    Patient presented to [facility] on 05/23/22 due to NSTEMI  New?Medications Started at [place] Discharge:?? -started  apixaban (ELIQUIS) furosemide (LASIX) polyethylene glycol powder (GLYCOLAX/MIRALAX)  Medication Changes at Hospital Discharge: -Changed  none  Medications Discontinued at Hospital  Discharge: -Stopped  amoxicillin 500 MG capsule (AMOXIL) warfarin 7.5 MG tablet (COUMADIN)  Medications that remain the same after Hospital Discharge:??  -All other medications will remain the same.      Fill History : ELIQUIS 5 MG TAB 06/23/2022 30   atorvastatin 20 mg tablet 04/27/2022 90   ceftriaxone 2 gram solution for injection 07/02/2022 7   digoxin 125 mcg (0.125 mg) tablet 06/03/2022 90   JARDIANCE 10 MG TAB 07/01/2022 30   furosemide (LASIX) 20 MG tablet 05/26/2022 30   metoprolol tartrate 25 mg tablet 05/20/2022 90   polyethylene glycol powder (GLYCOLAX/MIRALAX) 17 GM/SCOOP powder 05/26/2022 14   potassium chloride ER 10 mEq tablet,extended release 07/02/2022 30   warfarin 7.5 mg tablet 05/20/2022 35     Star Rating Drugs:  Atorvastatin 20 mg - Last filled 06/23/22 30 DS at [facility] Jardiance 10 mg - Last filled 07/01/22 30 DS at [facility]    Care Gaps: Covid Booster - Overdue AWV - 05/11/22 PNA Vaccine -  Postponed      Pamala Duffel CMA Clinical Pharmacist Assistant [phone]

## 2022-07-12 NOTE — Patient Outreach (Signed)
  Care Coordination   Follow Up Visit Note   07/12/2022 Name: KAESIN EMERICK MRN: [id number] DOB: Nov 07, 1950  Darleen Crocker Maile is a 72 y.o. year old male who sees [name], MD for primary care. I  spoke with the patients spouse Marylu Lund by phone to follow up on care coordination needs related to meals.  What matters to the patients health and wellness today?  Patient was referred to SW for assistance with post acute meals. SW confirmed the patient has received Moms Meals via his [employer] benefit. Patients spouse reports no other SW needs at this time.  SW collaboration with RN Care Manager Lawanna Kobus Little advising patient has received desired meals.   SDOH assessments and interventions completed:  No    Care Coordination Interventions:  Yes, provided   Interventions Today    Flowsheet Row Most Recent Value  Chronic Disease   Chronic disease during today's visit Congestive Heart Failure (CHF)  General Interventions   General Interventions Discussed/Reviewed Communication with  Communication with RN  Nutrition Interventions   Nutrition Discussed/Reviewed Nutrition Discussed  [Confirmed pt has received [employer] meals]        Follow up plan: No further intervention required. The patient will remain engaged with RN Care Manager for ongoing care coordination needs.    Encounter Outcome:  Pt. Visit Completed   Bevelyn Ngo, BSW, CDP Social Worker, Certified Dementia Practitioner [facility] Care Management  Care Coordination [phone]

## 2022-07-12 NOTE — Patient Instructions (Signed)
Visit Information  Thank you for taking time to visit with me today. Please don't hesitate to contact me if I can be of assistance to you.   Following are the goals we discussed today:  - Engage with RN Care Manager for ongoing care coordination needs  Your next appointment is by telephone on 6/13 at 9:30 am with RN Care Manager Lawanna Kobus Little  Please call the care guide team at [phone] if you need to cancel or reschedule your appointment.   If you are experiencing a Mental Health or Behavioral Health Crisis or need someone to talk to, please go to [facility] [address], [place] [phone]) call 911  Patient verbalizes understanding of instructions and care plan provided today and agrees to view in MyChart. Active MyChart status and patient understanding of how to access instructions and care plan via MyChart confirmed with patient.     No further follow up required: Please contact the care coordination team as needed.  Bevelyn Ngo, BSW, CDP Social Worker, Certified Dementia Practitioner [facility] Care Management  Care Coordination [phone]

## 2022-07-13 DIAGNOSIS — I214 Non-ST elevation (NSTEMI) myocardial infarction: Secondary | ICD-10-CM | POA: Diagnosis not present

## 2022-07-13 DIAGNOSIS — T826XXA Infection and inflammatory reaction due to cardiac valve prosthesis, initial encounter: Secondary | ICD-10-CM | POA: Diagnosis not present

## 2022-07-13 DIAGNOSIS — I330 Acute and subacute infective endocarditis: Secondary | ICD-10-CM | POA: Diagnosis not present

## 2022-07-13 DIAGNOSIS — I480 Paroxysmal atrial fibrillation: Secondary | ICD-10-CM | POA: Diagnosis not present

## 2022-07-13 DIAGNOSIS — I2510 Atherosclerotic heart disease of native coronary artery without angina pectoris: Secondary | ICD-10-CM | POA: Diagnosis not present

## 2022-07-13 DIAGNOSIS — I4892 Unspecified atrial flutter: Secondary | ICD-10-CM | POA: Diagnosis not present

## 2022-07-13 DIAGNOSIS — Z48812 Encounter for surgical aftercare following surgery on the circulatory system: Secondary | ICD-10-CM | POA: Diagnosis not present

## 2022-07-13 DIAGNOSIS — I442 Atrioventricular block, complete: Secondary | ICD-10-CM | POA: Diagnosis not present

## 2022-07-13 DIAGNOSIS — I5032 Chronic diastolic (congestive) heart failure: Secondary | ICD-10-CM | POA: Diagnosis not present

## 2022-07-14 ENCOUNTER — Ambulatory Visit (HOSPITAL_COMMUNITY)
Admission: RE | Admit: 2022-07-14 | Discharge: 2022-07-14 | Disposition: A | Discharge status: Home / Self Care | Payer: Medicare HMO | Source: Ambulatory Visit | Attending: Cardiology | Admitting: Cardiology

## 2022-07-14 ENCOUNTER — Encounter (HOSPITAL_COMMUNITY): Payer: Self-pay | Admitting: Cardiology

## 2022-07-14 VITALS — BP 102/70 | HR 95 | Wt 215.0 lb

## 2022-07-14 DIAGNOSIS — Z950 Presence of cardiac pacemaker: Secondary | ICD-10-CM | POA: Diagnosis not present

## 2022-07-14 DIAGNOSIS — I50810 Right heart failure, unspecified: Secondary | ICD-10-CM

## 2022-07-14 DIAGNOSIS — Z48812 Encounter for surgical aftercare following surgery on the circulatory system: Secondary | ICD-10-CM | POA: Diagnosis not present

## 2022-07-14 DIAGNOSIS — I4892 Unspecified atrial flutter: Secondary | ICD-10-CM | POA: Diagnosis not present

## 2022-07-14 DIAGNOSIS — I330 Acute and subacute infective endocarditis: Secondary | ICD-10-CM

## 2022-07-14 DIAGNOSIS — B952 Enterococcus as the cause of diseases classified elsewhere: Secondary | ICD-10-CM | POA: Insufficient documentation

## 2022-07-14 DIAGNOSIS — I5032 Chronic diastolic (congestive) heart failure: Secondary | ICD-10-CM | POA: Diagnosis not present

## 2022-07-14 DIAGNOSIS — I083 Combined rheumatic disorders of mitral, aortic and tricuspid valves: Secondary | ICD-10-CM | POA: Diagnosis not present

## 2022-07-14 DIAGNOSIS — Z951 Presence of aortocoronary bypass graft: Secondary | ICD-10-CM

## 2022-07-14 DIAGNOSIS — I509 Heart failure, unspecified: Secondary | ICD-10-CM | POA: Insufficient documentation

## 2022-07-14 DIAGNOSIS — T826XXA Infection and inflammatory reaction due to cardiac valve prosthesis, initial encounter: Secondary | ICD-10-CM | POA: Diagnosis not present

## 2022-07-14 DIAGNOSIS — I480 Paroxysmal atrial fibrillation: Secondary | ICD-10-CM | POA: Diagnosis not present

## 2022-07-14 DIAGNOSIS — Z953 Presence of xenogenic heart valve: Secondary | ICD-10-CM

## 2022-07-14 DIAGNOSIS — I2510 Atherosclerotic heart disease of native coronary artery without angina pectoris: Secondary | ICD-10-CM | POA: Diagnosis not present

## 2022-07-14 DIAGNOSIS — I442 Atrioventricular block, complete: Secondary | ICD-10-CM | POA: Diagnosis not present

## 2022-07-14 DIAGNOSIS — I214 Non-ST elevation (NSTEMI) myocardial infarction: Secondary | ICD-10-CM | POA: Diagnosis not present

## 2022-07-14 DIAGNOSIS — Z8774 Personal history of (corrected) congenital malformations of heart and circulatory system: Secondary | ICD-10-CM | POA: Insufficient documentation

## 2022-07-14 DIAGNOSIS — E785 Hyperlipidemia, unspecified: Secondary | ICD-10-CM | POA: Diagnosis not present

## 2022-07-14 DIAGNOSIS — Z79899 Other long term (current) drug therapy: Secondary | ICD-10-CM | POA: Insufficient documentation

## 2022-07-14 DIAGNOSIS — Z5181 Encounter for therapeutic drug level monitoring: Secondary | ICD-10-CM

## 2022-07-14 LAB — BASIC METABOLIC PANEL
Anion gap: 11 (ref 5–15)
BUN: 10 mg/dL (ref 8–23)
CO2: 22 mmol/L (ref 22–32)
Calcium: 8.3 mg/dL — ABNORMAL LOW (ref 8.9–10.3)
Chloride: 104 mmol/L (ref 98–111)
Creatinine, Ser: 0.9 mg/dL (ref 0.61–1.24)
GFR, Estimated: >60 mL/min (ref >60)
Glucose, Bld: 104 mg/dL — ABNORMAL HIGH (ref 70–99)
Potassium: 3.3 mmol/L — ABNORMAL LOW (ref 3.5–5.1)
Sodium: 137 mmol/L (ref 135–145)

## 2022-07-14 LAB — BRAIN NATRIURETIC PEPTIDE: B Natriuretic Peptide: 622.6 pg/mL — ABNORMAL HIGH (ref 0.0–100.0)

## 2022-07-14 LAB — CBC WITH DIFFERENTIAL/PLATELET
Abs Immature Granulocytes: 0.04 10*3/uL (ref 0.00–0.07)
Basophils Absolute: 0.1 10*3/uL (ref 0.0–0.1)
Basophils Relative: 1 %
Eosinophils Absolute: 0.1 10*3/uL (ref 0.0–0.5)
Eosinophils Relative: 1 %
HCT: 40 % (ref 39.0–52.0)
Hemoglobin: 11.9 g/dL — ABNORMAL LOW (ref 13.0–17.0)
Immature Granulocytes: 0 %
Lymphocytes Relative: 11 %
Lymphs Abs: 1.1 10*3/uL (ref 0.7–4.0)
MCH: 25.5 pg — ABNORMAL LOW (ref 26.0–34.0)
MCHC: 29.8 g/dL — ABNORMAL LOW (ref 30.0–36.0)
MCV: 85.7 fL (ref 80.0–100.0)
Monocytes Absolute: 0.7 10*3/uL (ref 0.1–1.0)
Monocytes Relative: 7 %
Neutro Abs: 7.8 10*3/uL — ABNORMAL HIGH (ref 1.7–7.7)
Neutrophils Relative %: 80 %
Platelets: 140 10*3/uL — ABNORMAL LOW (ref 150–400)
RBC: 4.67 MIL/uL (ref 4.22–5.81)
RDW: 19.8 % — ABNORMAL HIGH (ref 11.5–15.5)
WBC: 9.8 10*3/uL (ref 4.0–10.5)
nRBC: 0 % (ref 0.0–0.2)

## 2022-07-14 LAB — LAB REPORT - SCANNED: EGFR: 94

## 2022-07-14 LAB — DIGOXIN LEVEL: Digoxin Level: 0.7 ng/mL — ABNORMAL LOW (ref 0.8–2.0)

## 2022-07-14 NOTE — Patient Instructions (Signed)
Take and extra 40 mg of Lasix tomorrow morning.  Labs done today, your results will be available in MyChart, we will contact you for abnormal readings.  Your physician has requested that you have an echocardiogram. Echocardiography is a painless test that uses sound waves to create images of your heart. It provides your doctor with information about the size and shape of your heart and how well your heart's chambers and valves are working. This procedure takes approximately one hour. There are no restrictions for this procedure. Please do NOT wear cologne, perfume, aftershave, or lotions (deodorant is allowed). Please arrive 15 minutes prior to your appointment time.  Your physician recommends that you schedule a follow-up appointment in: 1 month with an echocardiogram  If you have any questions or concerns before your next appointment please send [place] a message through [place] or call our office at [phone].    TO LEAVE A MESSAGE FOR THE NURSE SELECT OPTION 2, PLEASE LEAVE A MESSAGE INCLUDING: YOUR NAME DATE OF BIRTH CALL BACK NUMBER REASON FOR CALL**this is important as we prioritize the call backs  YOU WILL RECEIVE A CALL BACK THE SAME DAY AS LONG AS YOU CALL BEFORE 4:00 PM  At the [facility], you and your health needs are our priority. As part of our continuing mission to provide you with exceptional heart care, we have created designated Provider Care Teams. These Care Teams include your primary Cardiologist (physician) and Advanced Practice Providers (APPs- Physician Assistants and Nurse Practitioners) who all work together to provide you with the care you need, when you need it.   You may see any of the following providers on your designated Care Team at your next follow up: Dr Arvilla Meres Dr Marca Ancona Dr. Marcos Eke, NP Robbie Lis, [facility] [place], [place] Brynda Peon, NP Karle Plumber, PharmD   Please be sure  to bring in all your medications bottles to every appointment.    Thank you for choosing [facility]

## 2022-07-14 NOTE — Progress Notes (Signed)
AD[facility]ANCED [facility] FAILURE CLINIC NOTE  Referring Physician: [name], MD  Primary Care: [name], MD Primary Cardiologist:  HPI: [name] is a 72 y.o. male with coronary artery disease status post CABG x 2 with left atrial appendage clipping and PFO closure, severe aortic stenosis and mitral stenosis status post bioprosthetic AVR and MVR, bioprosthetic mitral valve endocarditis (currently on IV antibiotics), hyperlipidemia, complete heart block status post permanent pacemaker, history of atrial flutter currently presenting today for follow up.. Based on chart review prior to his bioprosthetic AVR and MVR [name] had preserved LV and RV function.  Shortly after the procedure he had a pacemaker placed for complete heart block.  Since that time echocardiograms demonstrate enlarging right ventricle with progressively worsening tricuspid regurgitation.    In May 2024, he presented with left upper extremity ischemia with acute occlusion of the left proximal brachial artery status post thromboembolectomy by vascular surgery.  During that time he had a TEE which demonstrated severe bioprosthetic mitral valve endocarditis; large vegetation on mitral valve leaflets.  Blood cultures were positive for Enterococcus faecalis.  He was diuresed with IV Lasix and discharged home on ampicillin and Rocephin for 6 weeks (starting 06/18/2022).    Interval hx:  Since discharge from the hospital [name] is actually doing remarkably well.  His functional status has improved significantly.  He currently has a right upper extremity PICC line in for 6-week antibiotic course as he is extremely high risk for redo valve repair or ICD extraction. Currently taking lasix 20mg  every morning; he does have 2+ pitting edema to the knee in LLE and 1+ in the RLE. No PND, orthopnea; sleeping flat. He is now using a walker to ambulate due to deconditioning/weakness. He does not feel significantly limited by shortness of  breat at this time.   Activity level/exercise tolerance: NYHA III Orthopnea:  Sleeps on 2 pillows Paroxysmal noctural dyspnea:  no Chest pain/pressure:  no Orthostatic lightheadedness:  no Palpitations:  no Lower extremity edema:  yes but resolved Presyncope/syncope:  no Cough:  no  Past Medical History:  Diagnosis Date   BCE (basal cell epithelioma), arm    RIGHT SHOULDER   Bradycardia    Bumps on skin    on left side of nose for last 3 weeks   CAD (coronary artery disease)    a. s/p CABGx2 10/11/18 LIMA to LAD, SVG to RCA, EVH via right thigh.   Dyslipidemia    Ectatic thoracic aorta (HCC)    Esophageal stricture    GERD (gastroesophageal reflux disease)    Heart murmur    sees dr Rose Fillers   Hyperlipidemia    Incidental pulmonary nodule, > 3mm and < 8mm 10/04/2018   Noted on CTA   Inguinal hernia    bilateral   Obesity    Presence of permanent cardiac pacemaker    Prostate cancer (HCC)    S/P aortic valve replacement with bioprosthetic valve 10/11/2018   23 mm Edwards Inspiris Resilia stented bovine pericardial tissue valve, miltral valve done also   S/P CABG x 2 10/11/2018   LIMA to LAD, SVG to RCA, EVH via right thigh   S/P mitral valve replacement with bioprosthetic valve 10/11/2018   27 mm Medtronic Mosaic stented porcine bioprosthetic tissue valve   S/P patent foramen ovale closure 10/11/2018   S/P placement of cardiac pacemaker    Smoker     Current Outpatient Medications  Medication Sig Dispense Refill   acetaminophen (TYLENOL)  325 MG tablet Take 2 tablets (650 mg total) by mouth every 4 (four) hours as needed for moderate pain.     amoxicillin (AMOXIL) 500 MG tablet Take 1 tablet (500 mg total) by mouth 2 (two) times daily. 60 tablet 5   ampicillin IVPB Inject 12 g into the vein daily. As a continuous infusion. Indication:  Enterococcal endocarditis  First Dose: Yes Last Day of Therapy:  07/30/22 Labs - Once weekly:  CBC/D and BMP, Labs - Every other week:   ESR and CRP Method of administration: Ambulatory Pump (Continuous Infusion) Method of administration may be changed at the discretion of home infusion pharmacist based upon assessment of the patient and/or caregiver's ability to self-administer the medication ordered. 39 Units 0   apixaban (ELIQUIS) 5 MG TABS tablet Take 1 tablet (5 mg total) by mouth 2 (two) times daily. 60 tablet 1   atorvastatin (LIPITOR) 20 MG tablet Take 1 tablet (20 mg total) by mouth daily. 90 tablet 3   cefTRIAXone (ROCEPHIN) IVPB Inject 2 g into the vein every 12 (twelve) hours. Indication:  Enterococcal endocarditis  First Dose: Yes Last Day of Therapy:  07/30/22 Labs - Once weekly:  CBC/D and BMP, Labs - Every other week:  ESR and CRP Method of administration: IV Push Method of administration may be changed at the discretion of home infusion pharmacist based upon assessment of the patient and/or caregiver's ability to self-administer the medication ordered. 78 Units 0   digoxin (LANOXIN) 0.125 MG tablet Take 1 tablet (0.125 mg total) by mouth daily. 90 tablet 3   empagliflozin (JARDIANCE) 10 MG TABS tablet Take 1 tablet (10 mg total) by mouth daily before breakfast. 30 tablet 11   ferrous sulfate 325 (65 FE) MG tablet Take 325 mg by mouth daily with breakfast.     furosemide (LASIX) 20 MG tablet Take 20 mg by mouth daily.     metoprolol tartrate (LOPRESSOR) 25 MG tablet Take 1.5 tablets (37.5 mg total) by mouth 2 (two) times daily. 180 tablet 3   oxymetazoline (AFRIN) 0.05 % nasal spray Place 1 spray into both nostrils at bedtime as needed for congestion.     polyethylene glycol powder (GLYCOLAX/MIRALAX) 17 GM/SCOOP powder Take 17 g by mouth daily as needed for mild constipation. 238 g 0   potassium chloride (KLOR-CON) 10 MEQ tablet Take 1 tablet (10 mEq total) by mouth daily. 30 tablet 1   No current facility-administered medications for this encounter.    No Known Allergies    Social History   Socioeconomic  History   Marital status: Married    Spouse name: Not on file   Number of children: 0   Years of education: Not on file   Highest education level: Not on file  Occupational History   Occupation: Photographer    Employer: [employer]  Tobacco Use   Smoking status: Former    Packs/day: 0.25    Years: 35.00    Additional pack years: 0.00    Total pack years: 8.75    Types: Cigarettes    Quit date: 09/04/2018    Years since quitting: 3.8   Smokeless tobacco: Never  Vaping Use   Vaping Use: Never used  Substance and Sexual Activity   Alcohol use: Yes    Alcohol/week: 1.0 standard drink of alcohol    Types: 1 Cans of beer per week    Comment: rare   Drug use: No   Sexual activity: Yes  Other Topics Concern  Not on file  Social History Narrative   Not on file   Social Determinants of Health   Financial Resource Strain: Low Risk  (05/11/2022)   Overall Financial Resource Strain (CARDIA)    Difficulty of Paying Living Expenses: Not hard at all  Food Insecurity: No Food Insecurity (06/24/2022)   Hunger Vital Sign    Worried About Running Out of Food in the Last Year: Never true    Ran Out of Food in the Last Year: Never true  Transportation Needs: No Transportation Needs (06/24/2022)   PRAPARE - Administrator, Civil Service (Medical): No    Lack of Transportation (Non-Medical): No  Physical Activity: Inactive (05/11/2022)   Exercise Vital Sign    Days of Exercise per Week: 0 days    Minutes of Exercise per Session: 0 min  Stress: No Stress Concern Present (05/11/2022)   [employer] of Occupational Health - Occupational Stress Questionnaire    Feeling of Stress : Not at all  Social Connections: Not on file  Intimate Partner Violence: Not At Risk (06/16/2022)   Humiliation, Afraid, Rape, and Kick questionnaire    Fear of Current or Ex-Partner: No    Emotionally Abused: No    Physically Abused: No    Sexually Abused: No      Family History  Problem Relation  Age of Onset   Colon cancer Mother    Lung cancer Father        smoker   CAD Neg Hx    Pancreatic cancer Neg Hx    Breast cancer Neg Hx     PHYSICAL EXAM: Vitals:   07/14/22 1444  BP: 102/70  Pulse: 95  SpO2: 96%   GENERAL: Well nourished, well developed, and in no apparent distress at rest.  HEENT: Negative for arcus senilis or xanthelasma. There is no scleral icterus.  The mucous membranes are pink and moist.   NECK: Supple, No masses. Normal carotid upstrokes without bruits. No masses or thyromegaly.    CHEST: There are no chest wall deformities. There is no chest wall tenderness. Respirations are unlabored.  Lungs- CTA B/L CARDIAC:  JVP: 9 cm          Normal rate with regular rhythm. No murmurs, rubs or gallops.  Pulses are 2+ and symmetrical in upper and lower extremities. 1+ edema on right, 2 on left LLE.  ABDOMEN: Soft, non-tender, non-distended. There are no masses or hepatomegaly. There are normal bowel sounds.  EXTREMITIES: Warm and well perfused with no cyanosis, clubbing. RUE PICC in place.  LYMPHATIC: No axillary or supraclavicular lymphadenopathy.  NEUROLOGIC: Patient is oriented x3 with no focal or lateralizing neurologic deficits.  PSYCH: Patients affect is appropriate, there is no evidence of anxiety or depression.  SKIN: Warm and dry; no lesions or wounds.    DATA REVIEW  ECG: 06/03/22: NSR  as per my personal interpretation  ECHO: 07/06/22: LVEF 50-55%, large vegetation on bioprosthetic mitral valve; dilated RV.  06/16/22 (TEE): LVEF 50-55%, 4+ TR, 2x2cm vegetation on bioprosthetic mitral valve.  05/23/22: Normal LV function, severely dilated RV with moderately reduced function, moderate stenosis of prosthesis (gradient ), severe TR due to poor coaptation of leaflets, s/p AVR as per my personal interpretation 06/13/19: Normal LV function with septal bowing to the left; dilated RV with mildly reduced function. Mitral bioprosthesis gradient of 06/27/18:  Normal LV function, normal RV function. Severe MS/AS with moderate AI.    CATH: 05/25/22:   Prox LAD lesion  is 40% stenosed.   Mid LAD lesion is 95% stenosed.   Prox Cx to Mid Cx lesion is 30% stenosed.   1st Mrg lesion is 30% stenosed.   3rd Mrg lesion is 30% stenosed.   Prox RCA lesion is 100% stenosed.   SVG graft was visualized by angiography and is normal in caliber.   LIMA graft was visualized by angiography and is normal in caliber.   Severe mid LAD stenosis. Competitive filling is seen distally from the patent LIMA graft.  Patent LIMA graft to the mid LAD.  Mild non-obstructive disease in the Circumflex artery.  Large dominant RCA with chronic proximal occlusion. The mid and distal RCA fills from the patent vein graft.  Near normal right and left heart pressures.  CO 6.92 L/min CI 3.33  09/15/2018: Prox RCA lesion is 100% stenosed. Prox Cx to Mid Cx lesion is 30% stenosed. 1st Mrg lesion is 30% stenosed. 3rd Mrg lesion is 30% stenosed. Prox LAD lesion is 40% stenosed. Mid LAD lesion is 95% stenosed.   1. Severe stenosis mid LAD 2. Mild non-obstructive disease in the LAD 3. Chronic occlusion of the proximal RCA. Filling of the proximal, mid and distal RCA from left to right collaterals 4. Severe aortic stenosis (mean gradient 34.1 mmHg, peak to peak gradient 32 mmHg, AVA 1.1 cm2) 5. Severe mitral stenosis by echo.     ASSESSMENT & PLAN:   1. RV dysfunction - RHC hemodynamics from 05/25/22 personally interpretted:  RA 10 (large V waves), RV 28/6-10, PA 25/15, PCWP 14,  -RV dysfunction is now likely multifactorial: he is chronically RV paced with device interrogation from 05/15/22 demonstrating 96% RV pacing, he has a dilated RV leading to poor coaptation of the tricuspid valve and as a result severe TR and in addition has likely moderate to severe stenosis of his bioprosthetic mitral valve. From a hemodynamic standpoint though the MS does not appear to be having a  significant effect on his PA pressures or PCWP.  - continue digoxin , start jardiance 10mg . Repeat digoxin level today. - Prior to MVR/AVR, his LV function and RV function were intact. Since that time, serial echocardiograms demonstrate worsening TR and RV dilation. Although he has moderate to severe bioprosthetic MS it has not impacted his PA pressures or PCWP enough to compromise RV function to this point. RV dilation may be secondary to chronic RV pacing leading to TR due to poor valve coaptation.  - Hypervolemic on exam, will take extra lasix today and 40mg  tomorrow.  - In the setting of recently diagnosed endocarditis he is high risk for any procedures; doing very well functionally. Will continue to medically manage.   2. Bioprosthetic mitral valve endocarditis & stenosis - TEE on 06/16/22 with severe bioprosthetic mitral valve endocarditis - Ampicillin and Rocephin for 6 weeks (starting 06/18/2022).   - Repeat TTE on 5/28 with persistent vegetation.  - Repeat CBC today  3. Severe TR - Appears functional and secondary to RV dilation leading to poor coaptation. See above.   4. CAD s/p CABG x 2, closure of PFO - AVR, MVR and CABG x 2 (LIMA-LAD, SVG to RCA) in 10/11/2018 - metoprolol 25mg  daily  5. pAFL - continue metoprolol - EKG today - apixaban 5mg  BID  6. Left brachial artery occlusion  - due to mitral valve endocarditis - adequate pulses b/l today   Tige Meas [facility] Mechanical Circulatory Support

## 2022-07-15 ENCOUNTER — Telehealth (HOSPITAL_COMMUNITY): Payer: Self-pay

## 2022-07-15 ENCOUNTER — Ambulatory Visit: Payer: Medicare HMO

## 2022-07-15 DIAGNOSIS — I5032 Chronic diastolic (congestive) heart failure: Secondary | ICD-10-CM | POA: Diagnosis not present

## 2022-07-15 DIAGNOSIS — I4892 Unspecified atrial flutter: Secondary | ICD-10-CM | POA: Diagnosis not present

## 2022-07-15 DIAGNOSIS — I214 Non-ST elevation (NSTEMI) myocardial infarction: Secondary | ICD-10-CM | POA: Diagnosis not present

## 2022-07-15 DIAGNOSIS — I2510 Atherosclerotic heart disease of native coronary artery without angina pectoris: Secondary | ICD-10-CM | POA: Diagnosis not present

## 2022-07-15 DIAGNOSIS — T826XXA Infection and inflammatory reaction due to cardiac valve prosthesis, initial encounter: Secondary | ICD-10-CM | POA: Diagnosis not present

## 2022-07-15 DIAGNOSIS — I330 Acute and subacute infective endocarditis: Secondary | ICD-10-CM | POA: Diagnosis not present

## 2022-07-15 DIAGNOSIS — I442 Atrioventricular block, complete: Secondary | ICD-10-CM | POA: Diagnosis not present

## 2022-07-15 DIAGNOSIS — Z48812 Encounter for surgical aftercare following surgery on the circulatory system: Secondary | ICD-10-CM | POA: Diagnosis not present

## 2022-07-15 DIAGNOSIS — I480 Paroxysmal atrial fibrillation: Secondary | ICD-10-CM | POA: Diagnosis not present

## 2022-07-15 NOTE — Telephone Encounter (Addendum)
Pt aware, agreeable, and verbalized understanding   ----- Message from Dorthula Nettles, DO sent at 07/15/2022 11:16 AM EDT ----- Can we please ask Mr. Vandall to take potassium today and tomorrow.   Adi

## 2022-07-16 ENCOUNTER — Other Ambulatory Visit (HOSPITAL_COMMUNITY): Payer: Medicare HMO

## 2022-07-17 DIAGNOSIS — R7881 Bacteremia: Secondary | ICD-10-CM | POA: Diagnosis not present

## 2022-07-19 ENCOUNTER — Other Ambulatory Visit (HOSPITAL_COMMUNITY): Payer: Self-pay | Admitting: Cardiology

## 2022-07-19 DIAGNOSIS — I442 Atrioventricular block, complete: Secondary | ICD-10-CM | POA: Diagnosis not present

## 2022-07-19 DIAGNOSIS — I330 Acute and subacute infective endocarditis: Secondary | ICD-10-CM | POA: Diagnosis not present

## 2022-07-19 DIAGNOSIS — I214 Non-ST elevation (NSTEMI) myocardial infarction: Secondary | ICD-10-CM | POA: Diagnosis not present

## 2022-07-19 DIAGNOSIS — T826XXA Infection and inflammatory reaction due to cardiac valve prosthesis, initial encounter: Secondary | ICD-10-CM | POA: Diagnosis not present

## 2022-07-19 DIAGNOSIS — I480 Paroxysmal atrial fibrillation: Secondary | ICD-10-CM | POA: Diagnosis not present

## 2022-07-19 DIAGNOSIS — I4892 Unspecified atrial flutter: Secondary | ICD-10-CM | POA: Diagnosis not present

## 2022-07-19 DIAGNOSIS — I2510 Atherosclerotic heart disease of native coronary artery without angina pectoris: Secondary | ICD-10-CM | POA: Diagnosis not present

## 2022-07-19 DIAGNOSIS — I5032 Chronic diastolic (congestive) heart failure: Secondary | ICD-10-CM | POA: Diagnosis not present

## 2022-07-19 DIAGNOSIS — Z48812 Encounter for surgical aftercare following surgery on the circulatory system: Secondary | ICD-10-CM | POA: Diagnosis not present

## 2022-07-19 MED ORDER — FUROSEMIDE 20 MG PO TABS: 20.0000 mg | ORAL_TABLET | Freq: Every day | ORAL | 11 refills | Status: DC

## 2022-07-20 DIAGNOSIS — I2510 Atherosclerotic heart disease of native coronary artery without angina pectoris: Secondary | ICD-10-CM | POA: Diagnosis not present

## 2022-07-20 DIAGNOSIS — Z48812 Encounter for surgical aftercare following surgery on the circulatory system: Secondary | ICD-10-CM | POA: Diagnosis not present

## 2022-07-20 DIAGNOSIS — T826XXA Infection and inflammatory reaction due to cardiac valve prosthesis, initial encounter: Secondary | ICD-10-CM | POA: Diagnosis not present

## 2022-07-20 DIAGNOSIS — I5032 Chronic diastolic (congestive) heart failure: Secondary | ICD-10-CM | POA: Diagnosis not present

## 2022-07-20 DIAGNOSIS — I330 Acute and subacute infective endocarditis: Secondary | ICD-10-CM | POA: Diagnosis not present

## 2022-07-20 DIAGNOSIS — I214 Non-ST elevation (NSTEMI) myocardial infarction: Secondary | ICD-10-CM | POA: Diagnosis not present

## 2022-07-20 DIAGNOSIS — I4892 Unspecified atrial flutter: Secondary | ICD-10-CM | POA: Diagnosis not present

## 2022-07-20 DIAGNOSIS — I480 Paroxysmal atrial fibrillation: Secondary | ICD-10-CM | POA: Diagnosis not present

## 2022-07-20 DIAGNOSIS — I442 Atrioventricular block, complete: Secondary | ICD-10-CM | POA: Diagnosis not present

## 2022-07-20 LAB — LAB REPORT - SCANNED: EGFR: 110

## 2022-07-21 DIAGNOSIS — T826XXA Infection and inflammatory reaction due to cardiac valve prosthesis, initial encounter: Secondary | ICD-10-CM | POA: Diagnosis not present

## 2022-07-21 DIAGNOSIS — I4892 Unspecified atrial flutter: Secondary | ICD-10-CM | POA: Diagnosis not present

## 2022-07-21 DIAGNOSIS — I214 Non-ST elevation (NSTEMI) myocardial infarction: Secondary | ICD-10-CM | POA: Diagnosis not present

## 2022-07-21 DIAGNOSIS — I480 Paroxysmal atrial fibrillation: Secondary | ICD-10-CM | POA: Diagnosis not present

## 2022-07-21 DIAGNOSIS — I2510 Atherosclerotic heart disease of native coronary artery without angina pectoris: Secondary | ICD-10-CM | POA: Diagnosis not present

## 2022-07-21 DIAGNOSIS — I5032 Chronic diastolic (congestive) heart failure: Secondary | ICD-10-CM | POA: Diagnosis not present

## 2022-07-21 DIAGNOSIS — Z48812 Encounter for surgical aftercare following surgery on the circulatory system: Secondary | ICD-10-CM | POA: Diagnosis not present

## 2022-07-21 DIAGNOSIS — I442 Atrioventricular block, complete: Secondary | ICD-10-CM | POA: Diagnosis not present

## 2022-07-21 DIAGNOSIS — I330 Acute and subacute infective endocarditis: Secondary | ICD-10-CM | POA: Diagnosis not present

## 2022-07-22 ENCOUNTER — Ambulatory Visit: Payer: Self-pay

## 2022-07-22 ENCOUNTER — Other Ambulatory Visit: Payer: Self-pay

## 2022-07-22 ENCOUNTER — Ambulatory Visit: Payer: Medicare HMO | Admitting: Internal Medicine

## 2022-07-22 VITALS — BP 131/87 | HR 95 | Temp 97.2°F | Resp 16

## 2022-07-22 DIAGNOSIS — I330 Acute and subacute infective endocarditis: Secondary | ICD-10-CM

## 2022-07-22 MED ORDER — AMOXICILLIN 500 MG PO CAPS: 1000.0000 mg | ORAL_CAPSULE | Freq: Three times a day (TID) | ORAL | 0 refills | Status: DC

## 2022-07-22 MED ORDER — CLOTRIMAZOLE 1 % EX CREA: 1.0000 | TOPICAL_CREAM | Freq: Two times a day (BID) | CUTANEOUS | 0 refills | Status: DC

## 2022-07-22 NOTE — Patient Instructions (Signed)
Visit Information  Thank you for taking time to visit with me today. Please don't hesitate to contact me if I can be of assistance to you.   Following are the goals we discussed today:   Goals Addressed             This Visit's Progress    RN Care Coordination Activities: further follow up needed       Care Coordination Interventions: Evaluation of current treatment plan related to acute bacterial endocarditis and patient's adherence to plan as established by provider Review of patient status, including review of consultant's reports, relevant laboratory and other test results, and medications completed Advised patient, providing education and rationale, to monitor blood pressure daily and record, calling PCP for findings outside established parameters Discussed importance of daily weight and advised patient to weigh and record daily Discussed the importance of keeping all appointments with provider Assessed social determinant of health barriers  Reviewed upcoming scheduled follow up with Dr. Thedore Mins set for this afternoon, 07/22/22 at 11:30 AM BP Readings from Last 3 Encounters:  07/14/22 102/70  07/12/22 132/88  06/22/22 138/74  Most recent eGFR/CrCl:  Lab Results  Component Value Date   EGFR 81 05/05/2022    No components found for: "CRCL"           Our next appointment is by telephone on 08/20/22 at 09:00 AM   Please call the care guide team at [phone] if you need to cancel or reschedule your appointment.   If you are experiencing a Mental Health or Behavioral Health Crisis or need someone to talk to, please call [phone]-TALK (toll free, 24 hour hotline)  Patient verbalizes understanding of instructions and care plan provided today and agrees to view in MyChart. Active MyChart status and patient understanding of how to access instructions and care plan via MyChart confirmed with patient.     Delsa Sale, RN, BSN, CCM Care Management Coordinator [facility] Care  Management  Direct Phone: [phone]

## 2022-07-22 NOTE — Progress Notes (Signed)
Patient: [name]  DOB: March 14, 1950 MRN: [id number] PCP: [name], MD    Chief Complaint  Patient presents with   Follow-up    Acute bacterial endocarditis - patient reports he is feeling okay but tired.       Patient Active Problem List   Diagnosis Date Noted   Chronic diastolic heart failure (HCC) 06/18/2022   Endocarditis 06/17/2022   Bacteremia due to Enterococcus 06/17/2022   Acute CVA (cerebrovascular accident) (HCC) 06/17/2022   Ischemia of left upper extremity 06/15/2022   Arterial occlusion 06/15/2022   Brachial artery occlusion, left (HCC) 06/15/2022   Paroxysmal A-fib (HCC) 06/15/2022   Secondary hypercoagulable state (HCC) 06/15/2022   History of mitral valve repair 06/15/2022   Demand ischemia 05/25/2022   NSTEMI (non-ST elevated myocardial infarction) (HCC) 05/23/2022   Senile purpura (HCC) 05/18/2022   Atrial flutter (HCC) 09/02/2021   Atherosclerosis of aorta (HCC) 04/30/2019   Actinic keratosis 04/30/2019   Malignant neoplasm of prostate (HCC) 02/06/2019   Complete heart block (HCC) 01/18/2019   Pacemaker 01/18/2019   Encounter for therapeutic drug monitoring 10/23/2018   H/O aortic valve replacement 10/11/2018   S/P CABG x 2 10/11/2018   History of mitral valve replacement 10/11/2018   Incidental pulmonary nodule, > 3mm and < 8mm 10/04/2018   Coronary artery disease involving native coronary artery of native heart without angina pectoris    Mixed hyperlipidemia 06/15/2017   Hypogonadism in male 11/05/2015   Former smoker 12/27/2011   Obesity (BMI 30-39.9) 12/27/2011   History of esophageal stricture 12/27/2011     Subjective:  [name] is a72 YM with PMHX as below and 72 year old male with bioprosthetic AVR and by bioprosthetic MVR admitted with acute thrombus to left lower extremity requiring urgent surgery presents for follow-up on E faecalis bacteremia . Discharged on IV abc x 6 weeks.    Today 07/12/22: Doing well tolerating  abx. Deny fevers. States takin yogurt with abx.  ROS  Past Medical History:  Diagnosis Date   BCE (basal cell epithelioma), arm    RIGHT SHOULDER   Bradycardia    Bumps on skin    on left side of nose for last 3 weeks   CAD (coronary artery disease)    a. s/p CABGx2 10/11/18 LIMA to LAD, SVG to RCA, EVH via right thigh.   Dyslipidemia    Ectatic thoracic aorta (HCC)    Esophageal stricture    GERD (gastroesophageal reflux disease)    Heart murmur    sees dr Rose Fillers   Hyperlipidemia    Incidental pulmonary nodule, > 3mm and < 8mm 10/04/2018   Noted on CTA   Inguinal hernia    bilateral   Obesity    Presence of permanent cardiac pacemaker    Prostate cancer (HCC)    S/P aortic valve replacement with bioprosthetic valve 10/11/2018   23 mm Edwards Inspiris Resilia stented bovine pericardial tissue valve, miltral valve done also   S/P CABG x 2 10/11/2018   LIMA to LAD, SVG to RCA, EVH via right thigh   S/P mitral valve replacement with bioprosthetic valve 10/11/2018   27 mm Medtronic Mosaic stented porcine bioprosthetic tissue valve   S/P patent foramen ovale closure 10/11/2018   S/P placement of cardiac pacemaker    Smoker     Outpatient Medications Prior to Visit  Medication Sig Dispense Refill   acetaminophen (TYLENOL) 325 MG tablet Take 2 tablets (650 mg total) by mouth every 4 (  four) hours as needed for moderate pain.     amoxicillin (AMOXIL) 500 MG tablet Take 1 tablet (500 mg total) by mouth 2 (two) times daily. 60 tablet 5   ampicillin IVPB Inject 12 g into the vein daily. As a continuous infusion. Indication:  Enterococcal endocarditis  First Dose: Yes Last Day of Therapy:  07/30/22 Labs - Once weekly:  CBC/D and BMP, Labs - Every other week:  ESR and CRP Method of administration: Ambulatory Pump (Continuous Infusion) Method of administration may be changed at the discretion of home infusion pharmacist based upon assessment of the patient and/or caregiver's ability to  self-administer the medication ordered. 39 Units 0   apixaban (ELIQUIS) 5 MG TABS tablet Take 1 tablet (5 mg total) by mouth 2 (two) times daily. 60 tablet 1   atorvastatin (LIPITOR) 20 MG tablet Take 1 tablet (20 mg total) by mouth daily. 90 tablet 3   cefTRIAXone (ROCEPHIN) IVPB Inject 2 g into the vein every 12 (twelve) hours. Indication:  Enterococcal endocarditis  First Dose: Yes Last Day of Therapy:  07/30/22 Labs - Once weekly:  CBC/D and BMP, Labs - Every other week:  ESR and CRP Method of administration: IV Push Method of administration may be changed at the discretion of home infusion pharmacist based upon assessment of the patient and/or caregiver's ability to self-administer the medication ordered. 78 Units 0   digoxin (LANOXIN) 0.125 MG tablet Take 1 tablet (0.125 mg total) by mouth daily. 90 tablet 3   empagliflozin (JARDIANCE) 10 MG TABS tablet Take 1 tablet (10 mg total) by mouth daily before breakfast. 30 tablet 11   ferrous sulfate 325 (65 FE) MG tablet Take 325 mg by mouth daily with breakfast.     furosemide (LASIX) 20 MG tablet Take 1 tablet (20 mg total) by mouth daily. 30 tablet 11   metoprolol tartrate (LOPRESSOR) 25 MG tablet Take 1.5 tablets (37.5 mg total) by mouth 2 (two) times daily. 180 tablet 3   oxymetazoline (AFRIN) 0.05 % nasal spray Place 1 spray into both nostrils at bedtime as needed for congestion.     polyethylene glycol powder (GLYCOLAX/MIRALAX) 17 GM/SCOOP powder Take 17 g by mouth daily as needed for mild constipation. 238 g 0   potassium chloride (KLOR-CON) 10 MEQ tablet Take 1 tablet (10 mEq total) by mouth daily. 30 tablet 1   No facility-administered medications prior to visit.     No Known Allergies  Social History   Tobacco Use   Smoking status: Former    Packs/day: 0.25    Years: 35.00    Additional pack years: 0.00    Total pack years: 8.75    Types: Cigarettes    Quit date: 09/04/2018    Years since quitting: 3.8   Smokeless tobacco:  Never  Vaping Use   Vaping Use: Never used  Substance Use Topics   Alcohol use: Yes    Alcohol/week: 1.0 standard drink of alcohol    Types: 1 Cans of beer per week    Comment: rare   Drug use: No    Family History  Problem Relation Age of Onset   Colon cancer Mother    Lung cancer Father        smoker   CAD Neg Hx    Pancreatic cancer Neg Hx    Breast cancer Neg Hx     Objective:  There were no vitals filed for this visit. There is no height or weight on file to calculate  BMI.  Physical Exam Constitutional:      General: He is not in acute distress.    Appearance: He is normal weight. He is not toxic-appearing.  HENT:     Head: Normocephalic and atraumatic.     Right Ear: External ear normal.     Left Ear: External ear normal.     Nose: No congestion or rhinorrhea.     Mouth/Throat:     Mouth: Mucous membranes are moist.     Pharynx: Oropharynx is clear.  Eyes:     Extraocular Movements: Extraocular movements intact.     Conjunctiva/sclera: Conjunctivae normal.     Pupils: Pupils are equal, round, and reactive to light.  Cardiovascular:     Rate and Rhythm: Normal rate and regular rhythm.     Heart sounds: No murmur heard.    No friction rub. No gallop.  Pulmonary:     Effort: Pulmonary effort is normal.     Breath sounds: Normal breath sounds.  Abdominal:     General: Abdomen is flat. Bowel sounds are normal.     Palpations: Abdomen is soft.  Musculoskeletal:        General: No swelling. Normal range of motion.     Cervical back: Normal range of motion and neck supple.  Skin:    General: Skin is warm and dry.  Neurological:     General: No focal deficit present.     Mental Status: He is oriented to person, place, and time.  Psychiatric:        Mood and Affect: Mood normal.     Lab Results: Lab Results  Component Value Date   WBC 9.8 07/14/2022   HGB 11.9 (L) 07/14/2022   HCT 40.0 07/14/2022   MCV 85.7 07/14/2022   PLT 140 (L) 07/14/2022     Lab Results  Component Value Date   CREATININE 0.90 07/14/2022   BUN 10 07/14/2022   NA 137 07/14/2022   K 3.3 (L) 07/14/2022   CL 104 07/14/2022   CO2 22 07/14/2022    Lab Results  Component Value Date   ALT 14 06/15/2022   AST 25 06/15/2022   ALKPHOS 74 06/15/2022   BILITOT 1.7 (H) 06/15/2022     Assessment & Plan:   #E faecalis bacteremia with bioprosthetic mitral valve and 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 Randie Heinz, vascular surgery. Vascular surgery following -TEE  showed large vegetation encompassing majority of mitral valve leaflet, aortic bovine valve with small subcentimeter vegetation. -Seen by CTS Dr. Cliffton Asters, noted that given all his comorbidities the likelihood of subsequent surgical transfer limited.  Recommended medical management and antibiotic therapy at this point. -Discharged on Ampicillin 12 gm q24h continous infusion and ctx 2 gm q12h  x 6 weeks -TTE on 5/28 showed redemonstration of mitral valve prosthetic vegetation and LV chordal redundancy versus likely vegetation.  Plan: -EOT 6/20 of IV abx, Plan to switch to p.o. suppression amox 1gm tid at the end of IV antibiotics treatment on 6/20 and pull pICC. -Needs colonoscopy -ID F/U on 7/11   #Medication monting #PICC Esr/crp 27/93 on 07/19/22, wc 9.4 and scr 0.74   #Tenia pedis right foot -Clotrimazole cream   Danelle Earthly, MD [facility]  07/22/22  11:09 AM   I have personally spent 45 minutes involved in face-to-face and non-face-to-face activities for this patient on the day of the visit. Professional time spent includes the following activities: Preparing to see the  patient (review of tests), Obtaining and/or reviewing separately obtained history (admission/discharge record), Performing a medically appropriate examination and/or evaluation , Ordering medications/tests/procedures, referring and communicating with other health care professionals, Documenting clinical information in the EMR, Independently interpreting results (not separately reported), Communicating results to the patient/family/caregiver, Counseling and educating the patient/family/caregiver and Care coordination (not separately reported).

## 2022-07-22 NOTE — Patient Outreach (Signed)
  Care Coordination   Follow Up Visit Note   07/22/2022 Name: [name] MRN: [id number] DOB: [birth year]  [name] is a 72 y.o. year old male who sees [name], MD for primary care. I spoke with  [name] by phone today.  What matters to the patients health and wellness today?  Patient would like to continue to get stronger and make a full recovery from endocarditis.     Goals Addressed             This Visit's Progress    RN Care Coordination Activities: further follow up needed       Care Coordination Interventions: Evaluation of current treatment plan related to acute bacterial endocarditis and patient's adherence to plan as established by provider Review of patient status, including review of consultant's reports, relevant laboratory and other test results, and medications completed Advised patient, providing education and rationale, to monitor blood pressure daily and record, calling PCP for findings outside established parameters Discussed importance of daily weight and advised patient to weigh and record daily Discussed the importance of keeping all appointments with provider Assessed social determinant of health barriers  Reviewed upcoming scheduled follow up with Dr. Thedore Mins set for this afternoon, 07/22/22 at 11:30 AM BP Readings from Last 3 Encounters:  07/14/22 102/70  07/12/22 132/88  06/22/22 138/74  Most recent eGFR/CrCl:  Lab Results  Component Value Date   EGFR 81 05/05/2022    No components found for: "CRCL"     Interventions Today    Flowsheet Row Most Recent Value  Chronic Disease   Chronic disease during today's visit Congestive Heart Failure (CHF), Other  [acute bacterial endocarditis]  General Interventions   General Interventions Discussed/Reviewed General Interventions Discussed, General Interventions Reviewed, Doctor Visits, Labs  Doctor Visits Discussed/Reviewed Doctor Visits Discussed, Doctor Visits Reviewed, Specialist   Education Interventions   Education Provided Provided Education  Provided Verbal Education On Nutrition, When to see the doctor, Labs, Medication  Nutrition Interventions   Nutrition Discussed/Reviewed Nutrition Discussed  Pharmacy Interventions   Pharmacy Dicussed/Reviewed Pharmacy Topics Discussed, Pharmacy Topics Reviewed, Medications and their functions          SDOH assessments and interventions completed:  No     Care Coordination Interventions:  Yes, provided   Follow up plan: Follow up call scheduled for 08/20/22 @09 :00 AM    Encounter Outcome:  Pt. Visit Completed

## 2022-07-24 DIAGNOSIS — R7881 Bacteremia: Secondary | ICD-10-CM | POA: Diagnosis not present

## 2022-07-26 ENCOUNTER — Encounter: Payer: Self-pay | Admitting: Internal Medicine

## 2022-07-26 DIAGNOSIS — T826XXA Infection and inflammatory reaction due to cardiac valve prosthesis, initial encounter: Secondary | ICD-10-CM | POA: Diagnosis not present

## 2022-07-26 DIAGNOSIS — I4892 Unspecified atrial flutter: Secondary | ICD-10-CM | POA: Diagnosis not present

## 2022-07-26 DIAGNOSIS — I330 Acute and subacute infective endocarditis: Secondary | ICD-10-CM | POA: Diagnosis not present

## 2022-07-26 DIAGNOSIS — I442 Atrioventricular block, complete: Secondary | ICD-10-CM | POA: Diagnosis not present

## 2022-07-26 DIAGNOSIS — Z48812 Encounter for surgical aftercare following surgery on the circulatory system: Secondary | ICD-10-CM | POA: Diagnosis not present

## 2022-07-26 DIAGNOSIS — I5032 Chronic diastolic (congestive) heart failure: Secondary | ICD-10-CM | POA: Diagnosis not present

## 2022-07-26 DIAGNOSIS — I480 Paroxysmal atrial fibrillation: Secondary | ICD-10-CM | POA: Diagnosis not present

## 2022-07-26 DIAGNOSIS — I2510 Atherosclerotic heart disease of native coronary artery without angina pectoris: Secondary | ICD-10-CM | POA: Diagnosis not present

## 2022-07-26 DIAGNOSIS — I214 Non-ST elevation (NSTEMI) myocardial infarction: Secondary | ICD-10-CM | POA: Diagnosis not present

## 2022-07-27 DIAGNOSIS — I4892 Unspecified atrial flutter: Secondary | ICD-10-CM | POA: Diagnosis not present

## 2022-07-27 DIAGNOSIS — I480 Paroxysmal atrial fibrillation: Secondary | ICD-10-CM | POA: Diagnosis not present

## 2022-07-27 DIAGNOSIS — I214 Non-ST elevation (NSTEMI) myocardial infarction: Secondary | ICD-10-CM | POA: Diagnosis not present

## 2022-07-27 DIAGNOSIS — I2510 Atherosclerotic heart disease of native coronary artery without angina pectoris: Secondary | ICD-10-CM | POA: Diagnosis not present

## 2022-07-27 DIAGNOSIS — T826XXA Infection and inflammatory reaction due to cardiac valve prosthesis, initial encounter: Secondary | ICD-10-CM | POA: Diagnosis not present

## 2022-07-27 DIAGNOSIS — I330 Acute and subacute infective endocarditis: Secondary | ICD-10-CM | POA: Diagnosis not present

## 2022-07-27 DIAGNOSIS — I442 Atrioventricular block, complete: Secondary | ICD-10-CM | POA: Diagnosis not present

## 2022-07-27 DIAGNOSIS — Z48812 Encounter for surgical aftercare following surgery on the circulatory system: Secondary | ICD-10-CM | POA: Diagnosis not present

## 2022-07-27 DIAGNOSIS — I5032 Chronic diastolic (congestive) heart failure: Secondary | ICD-10-CM | POA: Diagnosis not present

## 2022-07-27 LAB — LAB REPORT - SCANNED: EGFR: 92

## 2022-07-28 DIAGNOSIS — I480 Paroxysmal atrial fibrillation: Secondary | ICD-10-CM | POA: Diagnosis not present

## 2022-07-28 DIAGNOSIS — I330 Acute and subacute infective endocarditis: Secondary | ICD-10-CM | POA: Diagnosis not present

## 2022-07-28 DIAGNOSIS — I2510 Atherosclerotic heart disease of native coronary artery without angina pectoris: Secondary | ICD-10-CM | POA: Diagnosis not present

## 2022-07-28 DIAGNOSIS — Z48812 Encounter for surgical aftercare following surgery on the circulatory system: Secondary | ICD-10-CM | POA: Diagnosis not present

## 2022-07-28 DIAGNOSIS — T826XXA Infection and inflammatory reaction due to cardiac valve prosthesis, initial encounter: Secondary | ICD-10-CM | POA: Diagnosis not present

## 2022-07-28 DIAGNOSIS — I442 Atrioventricular block, complete: Secondary | ICD-10-CM | POA: Diagnosis not present

## 2022-07-28 DIAGNOSIS — I214 Non-ST elevation (NSTEMI) myocardial infarction: Secondary | ICD-10-CM | POA: Diagnosis not present

## 2022-07-28 DIAGNOSIS — I5032 Chronic diastolic (congestive) heart failure: Secondary | ICD-10-CM | POA: Diagnosis not present

## 2022-07-28 DIAGNOSIS — I4892 Unspecified atrial flutter: Secondary | ICD-10-CM | POA: Diagnosis not present

## 2022-07-31 ENCOUNTER — Other Ambulatory Visit (HOSPITAL_COMMUNITY): Payer: Self-pay | Admitting: Family Medicine

## 2022-07-31 ENCOUNTER — Other Ambulatory Visit: Payer: Self-pay | Admitting: Medical

## 2022-08-02 ENCOUNTER — Other Ambulatory Visit (HOSPITAL_COMMUNITY): Payer: Self-pay

## 2022-08-02 ENCOUNTER — Telehealth (HOSPITAL_COMMUNITY): Payer: Self-pay

## 2022-08-02 DIAGNOSIS — I5032 Chronic diastolic (congestive) heart failure: Secondary | ICD-10-CM | POA: Diagnosis not present

## 2022-08-02 DIAGNOSIS — I4892 Unspecified atrial flutter: Secondary | ICD-10-CM | POA: Diagnosis not present

## 2022-08-02 DIAGNOSIS — I442 Atrioventricular block, complete: Secondary | ICD-10-CM | POA: Diagnosis not present

## 2022-08-02 DIAGNOSIS — R7881 Bacteremia: Secondary | ICD-10-CM | POA: Diagnosis not present

## 2022-08-02 DIAGNOSIS — Z48812 Encounter for surgical aftercare following surgery on the circulatory system: Secondary | ICD-10-CM | POA: Diagnosis not present

## 2022-08-02 DIAGNOSIS — R634 Abnormal weight loss: Secondary | ICD-10-CM | POA: Diagnosis not present

## 2022-08-02 DIAGNOSIS — Z8673 Personal history of transient ischemic attack (TIA), and cerebral infarction without residual deficits: Secondary | ICD-10-CM | POA: Diagnosis not present

## 2022-08-02 DIAGNOSIS — K219 Gastro-esophageal reflux disease without esophagitis: Secondary | ICD-10-CM | POA: Diagnosis not present

## 2022-08-02 DIAGNOSIS — I2510 Atherosclerotic heart disease of native coronary artery without angina pectoris: Secondary | ICD-10-CM | POA: Diagnosis not present

## 2022-08-02 DIAGNOSIS — I480 Paroxysmal atrial fibrillation: Secondary | ICD-10-CM | POA: Diagnosis not present

## 2022-08-02 DIAGNOSIS — T826XXA Infection and inflammatory reaction due to cardiac valve prosthesis, initial encounter: Secondary | ICD-10-CM | POA: Diagnosis not present

## 2022-08-02 DIAGNOSIS — I330 Acute and subacute infective endocarditis: Secondary | ICD-10-CM | POA: Diagnosis not present

## 2022-08-02 DIAGNOSIS — Z800 Family history of malignant neoplasm of digestive organs: Secondary | ICD-10-CM | POA: Diagnosis not present

## 2022-08-02 DIAGNOSIS — I214 Non-ST elevation (NSTEMI) myocardial infarction: Secondary | ICD-10-CM | POA: Diagnosis not present

## 2022-08-02 DIAGNOSIS — Z8679 Personal history of other diseases of the circulatory system: Secondary | ICD-10-CM | POA: Diagnosis not present

## 2022-08-02 NOTE — Telephone Encounter (Signed)
Last refill: 07/02/2022, qty 30 with 1 refill

## 2022-08-02 NOTE — Telephone Encounter (Signed)
Advanced [facility] Patient Advocate Encounter  Prior authorization for Eliquis has been submitted Key: Sharyn Creamer, CPhT Rx Patient Advocate Phone: [phone]

## 2022-08-04 ENCOUNTER — Other Ambulatory Visit (HOSPITAL_COMMUNITY): Payer: Self-pay

## 2022-08-04 DIAGNOSIS — I442 Atrioventricular block, complete: Secondary | ICD-10-CM | POA: Diagnosis not present

## 2022-08-04 DIAGNOSIS — I480 Paroxysmal atrial fibrillation: Secondary | ICD-10-CM | POA: Diagnosis not present

## 2022-08-04 DIAGNOSIS — I2510 Atherosclerotic heart disease of native coronary artery without angina pectoris: Secondary | ICD-10-CM | POA: Diagnosis not present

## 2022-08-04 DIAGNOSIS — Z48812 Encounter for surgical aftercare following surgery on the circulatory system: Secondary | ICD-10-CM | POA: Diagnosis not present

## 2022-08-04 DIAGNOSIS — I5032 Chronic diastolic (congestive) heart failure: Secondary | ICD-10-CM | POA: Diagnosis not present

## 2022-08-04 DIAGNOSIS — I214 Non-ST elevation (NSTEMI) myocardial infarction: Secondary | ICD-10-CM | POA: Diagnosis not present

## 2022-08-04 DIAGNOSIS — T826XXA Infection and inflammatory reaction due to cardiac valve prosthesis, initial encounter: Secondary | ICD-10-CM | POA: Diagnosis not present

## 2022-08-04 DIAGNOSIS — I330 Acute and subacute infective endocarditis: Secondary | ICD-10-CM | POA: Diagnosis not present

## 2022-08-04 DIAGNOSIS — I4892 Unspecified atrial flutter: Secondary | ICD-10-CM | POA: Diagnosis not present

## 2022-08-04 NOTE — Telephone Encounter (Signed)
Prior authorization for Eliquis has been approved. Test claim returns refill too soon rejection, unable to confirm copay at this time.  Effective 08/02/2022 to 02/08/2023

## 2022-08-05 DIAGNOSIS — C61 Malignant neoplasm of prostate: Secondary | ICD-10-CM | POA: Diagnosis not present

## 2022-08-13 ENCOUNTER — Ambulatory Visit (HOSPITAL_BASED_OUTPATIENT_CLINIC_OR_DEPARTMENT_OTHER)
Admission: RE | Admit: 2022-08-13 | Discharge: 2022-08-13 | Disposition: A | Discharge status: Home / Self Care | Payer: Medicare HMO | Source: Ambulatory Visit | Attending: Cardiology | Admitting: Cardiology

## 2022-08-13 ENCOUNTER — Encounter (HOSPITAL_COMMUNITY): Payer: Self-pay | Admitting: Cardiology

## 2022-08-13 ENCOUNTER — Ambulatory Visit (HOSPITAL_COMMUNITY)
Admission: RE | Admit: 2022-08-13 | Discharge: 2022-08-13 | Disposition: A | Discharge status: Home / Self Care | Payer: Medicare HMO | Source: Ambulatory Visit | Attending: Cardiology | Admitting: Cardiology

## 2022-08-13 VITALS — BP 130/80 | HR 96 | Wt 193.2 lb

## 2022-08-13 DIAGNOSIS — I330 Acute and subacute infective endocarditis: Secondary | ICD-10-CM | POA: Insufficient documentation

## 2022-08-13 DIAGNOSIS — I050 Rheumatic mitral stenosis: Secondary | ICD-10-CM

## 2022-08-13 DIAGNOSIS — I5032 Chronic diastolic (congestive) heart failure: Secondary | ICD-10-CM

## 2022-08-13 DIAGNOSIS — I50810 Right heart failure, unspecified: Secondary | ICD-10-CM

## 2022-08-13 DIAGNOSIS — Z87891 Personal history of nicotine dependence: Secondary | ICD-10-CM | POA: Insufficient documentation

## 2022-08-13 DIAGNOSIS — Z79899 Other long term (current) drug therapy: Secondary | ICD-10-CM | POA: Insufficient documentation

## 2022-08-13 DIAGNOSIS — Z951 Presence of aortocoronary bypass graft: Secondary | ICD-10-CM | POA: Insufficient documentation

## 2022-08-13 DIAGNOSIS — Z950 Presence of cardiac pacemaker: Secondary | ICD-10-CM | POA: Diagnosis not present

## 2022-08-13 DIAGNOSIS — I2510 Atherosclerotic heart disease of native coronary artery without angina pectoris: Secondary | ICD-10-CM | POA: Diagnosis not present

## 2022-08-13 DIAGNOSIS — Z8774 Personal history of (corrected) congenital malformations of heart and circulatory system: Secondary | ICD-10-CM | POA: Diagnosis not present

## 2022-08-13 DIAGNOSIS — E785 Hyperlipidemia, unspecified: Secondary | ICD-10-CM | POA: Insufficient documentation

## 2022-08-13 DIAGNOSIS — Z7901 Long term (current) use of anticoagulants: Secondary | ICD-10-CM | POA: Diagnosis not present

## 2022-08-13 DIAGNOSIS — I442 Atrioventricular block, complete: Secondary | ICD-10-CM | POA: Insufficient documentation

## 2022-08-13 DIAGNOSIS — Z953 Presence of xenogenic heart valve: Secondary | ICD-10-CM | POA: Diagnosis not present

## 2022-08-13 DIAGNOSIS — I110 Hypertensive heart disease with heart failure: Secondary | ICD-10-CM | POA: Insufficient documentation

## 2022-08-13 DIAGNOSIS — I083 Combined rheumatic disorders of mitral, aortic and tricuspid valves: Secondary | ICD-10-CM | POA: Insufficient documentation

## 2022-08-13 LAB — BRAIN NATRIURETIC PEPTIDE: B Natriuretic Peptide: 351.1 pg/mL — ABNORMAL HIGH (ref 0.0–100.0)

## 2022-08-13 LAB — BASIC METABOLIC PANEL
Anion gap: 11 (ref 5–15)
BUN: 13 mg/dL (ref 8–23)
CO2: 22 mmol/L (ref 22–32)
Calcium: 9.5 mg/dL (ref 8.9–10.3)
Chloride: 105 mmol/L (ref 98–111)
Creatinine, Ser: 0.96 mg/dL (ref 0.61–1.24)
GFR, Estimated: >60 mL/min (ref >60)
Glucose, Bld: 101 mg/dL — ABNORMAL HIGH (ref 70–99)
Potassium: 4.3 mmol/L (ref 3.5–5.1)
Sodium: 138 mmol/L (ref 135–145)

## 2022-08-13 LAB — ECHOCARDIOGRAM COMPLETE
AR max vel: 1.85 cm2
AV Area VTI: 1.97 cm2
AV Area mean vel: 1.87 cm2
AV Mean grad: 15 mmHg
AV Peak grad: 22.3 mmHg
Ao pk vel: 2.36 m/s
MV VTI: 1.16 cm2
S' Lateral: 2.8 cm

## 2022-08-13 LAB — DIGOXIN LEVEL: Digoxin Level: 0.7 ng/mL — ABNORMAL LOW (ref 0.8–2.0)

## 2022-08-13 MED ORDER — METOPROLOL TARTRATE 50 MG PO TABS
50.0000 mg | ORAL_TABLET | Freq: Two times a day (BID) | ORAL | 5 refills | Status: DC
Start: 2022-08-13 — End: 2022-09-13

## 2022-08-13 MED ORDER — SPIRONOLACTONE 25 MG PO TABS: 25.0000 mg | ORAL_TABLET | Freq: Every day | ORAL | 3 refills | Status: DC

## 2022-08-13 MED ORDER — PERFLUTREN LIPID MICROSPHERE
1.0000 mL | INTRAVENOUS | Status: DC | PRN
  Administered 2022-08-13: 2 mL via INTRAVENOUS
  Filled 2022-08-13: qty 10

## 2022-08-13 NOTE — Patient Instructions (Addendum)
Medication Changes:  INCREASE METOPROLOL TO 50mg  TWICE DAILY- UPDATE PRESCRIPTION SENT TO YOUR PHARMACY  STOP POTASSIUM   START: SPIRONOLACTONE 25mg  ONCE DAILY   Lab Work:  Labs done today, your results will be available in MyChart, we will contact you for abnormal readings.   Follow-Up in: 6 WEEKS AS SCHEDULED WITH Dr. Gasper Lloyd  At the [facility], you and your health needs are our priority. We have a designated team specialized in the treatment of Heart Failure. This Care Team includes your primary Heart Failure Specialized Cardiologist (physician), Advanced Practice Providers (APPs- Physician Assistants and Nurse Practitioners), and Pharmacist who all work together to provide you with the care you need, when you need it.   You may see any of the following providers on your designated Care Team at your next follow up:  Dr. Arvilla Meres Dr. Marca Ancona Dr. Marcos Eke, NP Robbie Lis, [place] [facility] [place], [place] Brynda Peon, NP Karle Plumber, PharmD   Please be sure to bring in all your medications bottles to every appointment.   Need to Contact us:  If you have any questions or concerns before your next appointment please send [place] a message through [place] or call our office at [phone].    TO LEAVE A MESSAGE FOR THE NURSE SELECT OPTION 2, PLEASE LEAVE A MESSAGE INCLUDING: YOUR NAME DATE OF BIRTH CALL BACK NUMBER REASON FOR CALL**this is important as we prioritize the call backs  YOU WILL RECEIVE A CALL BACK THE SAME DAY AS LONG AS YOU CALL BEFORE 4:00 PM

## 2022-08-13 NOTE — Progress Notes (Signed)
ADVANCED HEART FAILURE CLINIC NOTE  Referring Physician: [name], MD  Primary Care: [name], MD Primary Cardiologist:  HPI: [name] is a 72 y.o. male with coronary artery disease status post CABG x 2 with left atrial appendage clipping and PFO closure, severe aortic stenosis and mitral stenosis status post bioprosthetic AVR and MVR, bioprosthetic mitral valve endocarditis (currently on IV antibiotics), hyperlipidemia, complete heart block status post permanent pacemaker, history of atrial flutter currently presenting today for follow up.. Based on chart review prior to his bioprosthetic AVR and MVR Mr. Talmage had preserved LV and RV function.  Shortly after the procedure he had a pacemaker placed for complete heart block.  Since that time echocardiograms demonstrate enlarging right ventricle with progressively worsening tricuspid regurgitation.    In May 2024, he presented with left upper extremity ischemia with acute occlusion of the left proximal brachial artery status post thromboembolectomy by vascular surgery.  During that time he had a TEE which demonstrated severe bioprosthetic mitral valve endocarditis; large vegetation on mitral valve leaflets.  Blood cultures were positive for Enterococcus faecalis.  He was diuresed with IV Lasix and discharged home on ampicillin and Rocephin for 6 weeks (starting 06/18/2022).    Interval hx:  - Continues to do remarkably well since discharge from the follow up and gradual uptitration of medical therapy. Now able to perform ADLs without any limitations; increasing his walk distance; driving short distances. Walking up to 40-80ft without significant dyspnea. PICC line also removed after completion of IV antibiotics.   Activity level/exercise tolerance: NYHA IIB Orthopnea:  Sleeps on 2 pillows Paroxysmal noctural dyspnea:  no Chest pain/pressure:  no Orthostatic lightheadedness:  no Palpitations:  no Lower extremity edema:  yes but  resolved Presyncope/syncope:  no Cough:  no  Past Medical History:  Diagnosis Date   BCE (basal cell epithelioma), arm    RIGHT SHOULDER   Bradycardia    Bumps on skin    on left side of nose for last 3 weeks   CAD (coronary artery disease)    a. s/p CABGx2 10/11/18 LIMA to LAD, SVG to RCA, EVH via right thigh.   Dyslipidemia    Ectatic thoracic aorta (HCC)    Esophageal stricture    GERD (gastroesophageal reflux disease)    Heart murmur    sees dr Rose Fillers   Hyperlipidemia    Incidental pulmonary nodule, > 3mm and < 8mm 10/04/2018   Noted on CTA   Inguinal hernia    bilateral   Obesity    Presence of permanent cardiac pacemaker    Prostate cancer (HCC)    S/P aortic valve replacement with bioprosthetic valve 10/11/2018   23 mm Edwards Inspiris Resilia stented bovine pericardial tissue valve, miltral valve done also   S/P CABG x 2 10/11/2018   LIMA to LAD, SVG to RCA, EVH via right thigh   S/P mitral valve replacement with bioprosthetic valve 10/11/2018   27 mm Medtronic Mosaic stented porcine bioprosthetic tissue valve   S/P patent foramen ovale closure 10/11/2018   S/P placement of cardiac pacemaker    Smoker     Current Outpatient Medications  Medication Sig Dispense Refill   acetaminophen (TYLENOL) 325 MG tablet Take 2 tablets (650 mg total) by mouth every 4 (four) hours as needed for moderate pain.     amoxicillin (AMOXIL) 500 MG capsule Take 2 capsules (1,000 mg total) by mouth 3 (three) times daily. 180 capsule 0   apixaban (ELIQUIS) 5  MG TABS tablet TAKE 1 TABLET BY MOUTH TWICE DAILY 60 tablet 11   atorvastatin (LIPITOR) 20 MG tablet Take 1 tablet (20 mg total) by mouth daily. 90 tablet 3   clotrimazole (LOTRIMIN) 1 % cream Apply 1 Application topically 2 (two) times daily. 30 g 0   digoxin (LANOXIN) 0.125 MG tablet Take 1 tablet (0.125 mg total) by mouth daily. 90 tablet 3   empagliflozin (JARDIANCE) 10 MG TABS tablet Take 1 tablet (10 mg total) by mouth daily before  breakfast. 30 tablet 11   ferrous sulfate 325 (65 FE) MG tablet Take 325 mg by mouth daily with breakfast.     furosemide (LASIX) 20 MG tablet Take 1 tablet (20 mg total) by mouth daily. 30 tablet 11   metoprolol tartrate (LOPRESSOR) 25 MG tablet Take 1.5 tablets (37.5 mg total) by mouth 2 (two) times daily. 180 tablet 3   oxymetazoline (AFRIN) 0.05 % nasal spray Place 1 spray into both nostrils at bedtime as needed for congestion.     polyethylene glycol powder (GLYCOLAX/MIRALAX) 17 GM/SCOOP powder Take 17 g by mouth daily as needed for mild constipation. 238 g 0   potassium chloride (KLOR-CON) 10 MEQ tablet TAKE 1 TABLET BY MOUTH EVERY DAY 30 tablet 1   No current facility-administered medications for this encounter.   Facility-Administered Medications Ordered in Other Encounters  Medication Dose Route Frequency Provider Last Rate Last Admin   perflutren lipid microspheres (DEFINITY) IV suspension  1-10 mL Intravenous PRN Texanna Hilburn, DO   2 mL at 08/13/22 1436    No Known Allergies    Social History   Socioeconomic History   Marital status: Married    Spouse name: Not on file   Number of children: 0   Years of education: Not on file   Highest education level: Not on file  Occupational History   Occupation: Market researcher: [employer]  Tobacco Use   Smoking status: Former    Packs/day: 0.25    Years: 35.00    Additional pack years: 0.00    Total pack years: 8.75    Types: Cigarettes    Quit date: 09/04/2018    Years since quitting: 3.9   Smokeless tobacco: Never  Vaping Use   Vaping Use: Never used  Substance and Sexual Activity   Alcohol use: Yes    Alcohol/week: 1.0 standard drink of alcohol    Types: 1 Cans of beer per week    Comment: rare   Drug use: No   Sexual activity: Yes  Other Topics Concern   Not on file  Social History Narrative   Not on file   Social Determinants of Health   Financial Resource Strain: Low Risk  (05/11/2022)   Overall  Financial Resource Strain (CARDIA)    Difficulty of Paying Living Expenses: Not hard at all  Food Insecurity: No Food Insecurity (07/15/2022)   Hunger Vital Sign    Worried About Running Out of Food in the Last Year: Never true    Ran Out of Food in the Last Year: Never true  Transportation Needs: No Transportation Needs (06/24/2022)   PRAPARE - Administrator, Civil Service (Medical): No    Lack of Transportation (Non-Medical): No  Physical Activity: Inactive (05/11/2022)   Exercise Vital Sign    Days of Exercise per Week: 0 days    Minutes of Exercise per Session: 0 min  Stress: No Stress Concern Present (05/11/2022)   [employer] of Occupational  Health - Occupational Stress Questionnaire    Feeling of Stress : Not at all  Social Connections: Not on file  Intimate Partner Violence: Not At Risk (06/16/2022)   Humiliation, Afraid, Rape, and Kick questionnaire    Fear of Current or Ex-Partner: No    Emotionally Abused: No    Physically Abused: No    Sexually Abused: No      Family History  Problem Relation Age of Onset   Colon cancer Mother    Lung cancer Father        smoker   CAD Neg Hx    Pancreatic cancer Neg Hx    Breast cancer Neg Hx     PHYSICAL EXAM: Vitals:   08/13/22 1537  BP: 130/80  Pulse: 96  SpO2: 96%   GENERAL: Well nourished, well developed, and in no apparent distress at rest.  HEENT: Negative for arcus senilis or xanthelasma. There is no scleral icterus.  The mucous membranes are pink and moist.   NECK: Supple, No masses. Normal carotid upstrokes without bruits. No masses or thyromegaly.    CHEST: There are no chest wall deformities. There is no chest wall tenderness. Respirations are unlabored.  Lungs- CTA B/L CARDIAC:  JVP: 7 cm          Normal rate with regular rhythm. No murmurs, rubs or gallops.  Pulses are 2+ and symmetrical in upper and lower extremities. No edema.  ABDOMEN: Soft, non-tender, non-distended. There are no masses or  hepatomegaly. There are normal bowel sounds.  EXTREMITIES: Warm and well perfused with no cyanosis, clubbing.  LYMPHATIC: No axillary or supraclavicular lymphadenopathy.  NEUROLOGIC: Patient is oriented x3 with no focal or lateralizing neurologic deficits.  PSYCH: Patients affect is appropriate, there is no evidence of anxiety or depression.  SKIN: Warm and dry; no lesions or wounds.     DATA REVIEW  ECG: 06/03/22: NSR  as per my personal interpretation  ECHO: 07/06/22: LVEF 50-55%, large vegetation on bioprosthetic mitral valve; dilated RV.  06/16/22 (TEE): LVEF 50-55%, 4+ TR, 2x2cm vegetation on bioprosthetic mitral valve.  05/23/22: Normal LV function, severely dilated RV with moderately reduced function, moderate stenosis of prosthesis (gradient ), severe TR due to poor coaptation of leaflets, s/p AVR as per my personal interpretation 06/13/19: Normal LV function with septal bowing to the left; dilated RV with mildly reduced function. Mitral bioprosthesis gradient of 06/27/18: Normal LV function, normal RV function. Severe MS/AS with moderate AI.    CATH: 05/25/22:   Prox LAD lesion is 40% stenosed.   Mid LAD lesion is 95% stenosed.   Prox Cx to Mid Cx lesion is 30% stenosed.   1st Mrg lesion is 30% stenosed.   3rd Mrg lesion is 30% stenosed.   Prox RCA lesion is 100% stenosed.   SVG graft was visualized by angiography and is normal in caliber.   LIMA graft was visualized by angiography and is normal in caliber.   Severe mid LAD stenosis. Competitive filling is seen distally from the patent LIMA graft.  Patent LIMA graft to the mid LAD.  Mild non-obstructive disease in the Circumflex artery.  Large dominant RCA with chronic proximal occlusion. The mid and distal RCA fills from the patent vein graft.  Near normal right and left heart pressures.  CO 6.92 L/min CI 3.33  09/15/2018: Prox RCA lesion is 100% stenosed. Prox Cx to Mid Cx lesion is 30% stenosed. 1st Mrg lesion  is 30% stenosed. 3rd Mrg lesion is 30% stenosed.  Prox LAD lesion is 40% stenosed. Mid LAD lesion is 95% stenosed.   1. Severe stenosis mid LAD 2. Mild non-obstructive disease in the LAD 3. Chronic occlusion of the proximal RCA. Filling of the proximal, mid and distal RCA from left to right collaterals 4. Severe aortic stenosis (mean gradient 34.1 mmHg, peak to peak gradient 32 mmHg, AVA 1.1 cm2) 5. Severe mitral stenosis by echo.     ASSESSMENT & PLAN:   1. RV dysfunction - RHC hemodynamics from 05/25/22 personally interpretted:  RA 10 (large V waves), RV 28/6-10, PA 25/15, PCWP 14,  -RV dysfunction is now likely multifactorial: he is chronically RV paced with device interrogation from 05/15/22 demonstrating 96% RV pacing, he has a dilated RV leading to poor coaptation of the tricuspid valve and as a result severe TR and in addition has likely moderate to severe stenosis of his bioprosthetic mitral valve. From a hemodynamic standpoint though the MS does not appear to be having a significant effect on his PA pressures or PCWP.  - continue digoxin , continue jardiance 10mg . Repeat digoxin level today. - Prior to MVR/AVR, his LV function and RV function were intact. Since that time, serial echocardiograms demonstrate worsening TR and RV dilation. Although he has moderate to severe bioprosthetic MS it has not impacted his PA pressures or PCWP enough to compromise RV function to this point. RV dilation may be secondary to chronic RV pacing leading to TR due to poor valve coaptation.  - Euvolemic on exam, continue lasix 20mg  daily.  - In the setting of recently diagnosed endocarditis he is high risk for any procedures; doing very well functionally. Will continue to medically manage.  - start spironolactone 25mg  daily. D/C potassium. Plan to start losartan at follow up.   2. Bioprosthetic mitral valve endocarditis & stenosis - TEE on 06/16/22 with severe bioprosthetic mitral valve  endocarditis - Repeat TTE on 5/28 with persistent vegetation.  - Repeat TTE today w/ mild to moderately reduced RV function; mitral valve stenosis remains severe with thickening of the mitral valve.  - Completed 6 weeks of IV antibiotics. PICC line removed.  - Increase metoprolol to 50mg  BID.   3. Severe TR - Appears functional and secondary to RV dilation leading to poor coaptation. See above.   4. CAD s/p CABG x 2, closure of PFO - AVR, MVR and CABG x 2 (LIMA-LAD, SVG to RCA) in 10/11/2018 - metoprolol 25mg  daily  5. pAFL - continue metoprolol - apixaban 5mg  BID  6. Left brachial artery occlusion  - due to mitral valve endocarditis - adequate pulses b/l today   Asencion Guisinger Advanced Heart Failure Mechanical Circulatory Support

## 2022-08-16 ENCOUNTER — Ambulatory Visit (INDEPENDENT_AMBULATORY_CARE_PROVIDER_SITE_OTHER): Payer: Medicare HMO

## 2022-08-16 DIAGNOSIS — I442 Atrioventricular block, complete: Secondary | ICD-10-CM

## 2022-08-16 LAB — CUP PACEART REMOTE DEVICE CHECK
Battery Remaining Longevity: 84 mo
Battery Voltage: 2.97 V
Brady Statistic AP VP Percent: 0.06 %
Brady Statistic AP VS Percent: 0 %
Brady Statistic AS VP Percent: 90.74 %
Brady Statistic AS VS Percent: 9.2 %
Brady Statistic RA Percent Paced: 0.07 %
Brady Statistic RV Percent Paced: 90.8 %
Date Time Interrogation Session: 20240707221439
Implantable Lead Connection Status: 753985
Implantable Lead Connection Status: 753985
Implantable Lead Implant Date: 20200908
Implantable Lead Implant Date: 20200908
Implantable Lead Location: 753859
Implantable Lead Location: 753860
Implantable Lead Model: 3830
Implantable Lead Model: 5076
Implantable Pulse Generator Implant Date: 20200908
Lead Channel Impedance Value: 266 Ohm
Lead Channel Impedance Value: 304 Ohm
Lead Channel Impedance Value: 323 Ohm
Lead Channel Impedance Value: 437 Ohm
Lead Channel Pacing Threshold Amplitude: 0.5 V
Lead Channel Pacing Threshold Amplitude: 1 V
Lead Channel Pacing Threshold Pulse Width: 0.4 ms
Lead Channel Pacing Threshold Pulse Width: 0.4 ms
Lead Channel Sensing Intrinsic Amplitude: 2 mV
Lead Channel Sensing Intrinsic Amplitude: 2 mV
Lead Channel Sensing Intrinsic Amplitude: 7.25 mV
Lead Channel Sensing Intrinsic Amplitude: 7.25 mV
Lead Channel Setting Pacing Amplitude: 1.5 V
Lead Channel Setting Pacing Amplitude: 2.5 V
Lead Channel Setting Pacing Pulse Width: 0.8 ms
Lead Channel Setting Sensing Sensitivity: 1.2 mV
Zone Setting Status: 755011
Zone Setting Status: 755011

## 2022-08-19 ENCOUNTER — Ambulatory Visit (INDEPENDENT_AMBULATORY_CARE_PROVIDER_SITE_OTHER): Payer: Medicare HMO | Admitting: Internal Medicine

## 2022-08-19 ENCOUNTER — Encounter: Payer: Self-pay | Admitting: Internal Medicine

## 2022-08-19 ENCOUNTER — Other Ambulatory Visit: Payer: Self-pay

## 2022-08-19 VITALS — BP 96/63 | HR 89 | Resp 16 | Ht 67.0 in | Wt 193.0 lb

## 2022-08-19 DIAGNOSIS — I330 Acute and subacute infective endocarditis: Secondary | ICD-10-CM

## 2022-08-19 MED ORDER — AMOXICILLIN 500 MG PO CAPS: 500.0000 mg | ORAL_CAPSULE | Freq: Three times a day (TID) | ORAL | 5 refills | Status: DC

## 2022-08-19 NOTE — Progress Notes (Signed)
Patient: [name]  DOB: 08-07-1950 MRN: [id number] PCP: [name], MD    Patient Active Problem List   Diagnosis Date Noted   Chronic diastolic heart failure (HCC) 06/18/2022   Endocarditis 06/17/2022   Bacteremia due to Enterococcus 06/17/2022   Acute CVA (cerebrovascular accident) (HCC) 06/17/2022   Ischemia of left upper extremity 06/15/2022   Arterial occlusion 06/15/2022   Brachial artery occlusion, left (HCC) 06/15/2022   Paroxysmal A-fib (HCC) 06/15/2022   Secondary hypercoagulable state (HCC) 06/15/2022   History of mitral valve repair 06/15/2022   Demand ischemia 05/25/2022   NSTEMI (non-ST elevated myocardial infarction) (HCC) 05/23/2022   Senile purpura (HCC) 05/18/2022   Atrial flutter (HCC) 09/02/2021   Atherosclerosis of aorta (HCC) 04/30/2019   Actinic keratosis 04/30/2019   Malignant neoplasm of prostate (HCC) 02/06/2019   Complete heart block (HCC) 01/18/2019   Pacemaker 01/18/2019   Encounter for therapeutic drug monitoring 10/23/2018   H/O aortic valve replacement 10/11/2018   S/P CABG x 2 10/11/2018   History of mitral valve replacement 10/11/2018   Incidental pulmonary nodule, > 3mm and < 8mm 10/04/2018   Coronary artery disease involving native coronary artery of native heart without angina pectoris    Mixed hyperlipidemia 06/15/2017   Hypogonadism in male 11/05/2015   Former smoker 12/27/2011   Obesity (BMI 30-39.9) 12/27/2011   History of esophageal stricture 12/27/2011     Subjective:  [name] is a 72 y.o. M with PMHx as below and with bioprosthetic AVR and by bioprosthetic MVR admitted with acute thrombus to left lower extremity requiring urgent surgery presents for follow-up on E faecalis bacteremia . Discharged on IV abc x 6 weeks.    07/12/22: Doing well tolerating abx. Deny fevers. States takin yogurt with abx.  Today 08/19/22: tolerating abx.  Review of Systems  All other systems reviewed and are negative.   Past  Medical History:  Diagnosis Date   BCE (basal cell epithelioma), arm    RIGHT SHOULDER   Bradycardia    Bumps on skin    on left side of nose for last 3 weeks   CAD (coronary artery disease)    a. s/p CABGx2 10/11/18 LIMA to LAD, SVG to RCA, EVH via right thigh.   Dyslipidemia    Ectatic thoracic aorta (HCC)    Esophageal stricture    GERD (gastroesophageal reflux disease)    Heart murmur    sees dr Rose Fillers   Hyperlipidemia    Incidental pulmonary nodule, > 3mm and < 8mm 10/04/2018   Noted on CTA   Inguinal hernia    bilateral   Obesity    Presence of permanent cardiac pacemaker    Prostate cancer (HCC)    S/P aortic valve replacement with bioprosthetic valve 10/11/2018   23 mm Edwards Inspiris Resilia stented bovine pericardial tissue valve, miltral valve done also   S/P CABG x 2 10/11/2018   LIMA to LAD, SVG to RCA, EVH via right thigh   S/P mitral valve replacement with bioprosthetic valve 10/11/2018   27 mm Medtronic Mosaic stented porcine bioprosthetic tissue valve   S/P patent foramen ovale closure 10/11/2018   S/P placement of cardiac pacemaker    Smoker     Outpatient Medications Prior to Visit  Medication Sig Dispense Refill   acetaminophen (TYLENOL) 325 MG tablet Take 2 tablets (650 mg total) by mouth every 4 (four) hours as needed for moderate pain.     amoxicillin (AMOXIL) 500 MG  capsule Take 2 capsules (1,000 mg total) by mouth 3 (three) times daily. 180 capsule 0   apixaban (ELIQUIS) 5 MG TABS tablet TAKE 1 TABLET BY MOUTH TWICE DAILY 60 tablet 11   atorvastatin (LIPITOR) 20 MG tablet Take 1 tablet (20 mg total) by mouth daily. 90 tablet 3   clotrimazole (LOTRIMIN) 1 % cream Apply 1 Application topically 2 (two) times daily. 30 g 0   digoxin (LANOXIN) 0.125 MG tablet Take 1 tablet (0.125 mg total) by mouth daily. 90 tablet 3   empagliflozin (JARDIANCE) 10 MG TABS tablet Take 1 tablet (10 mg total) by mouth daily before breakfast. 30 tablet 11   ferrous sulfate 325  (65 FE) MG tablet Take 325 mg by mouth daily with breakfast.     furosemide (LASIX) 20 MG tablet Take 1 tablet (20 mg total) by mouth daily. 30 tablet 11   metoprolol tartrate (LOPRESSOR) 50 MG tablet Take 1 tablet (50 mg total) by mouth 2 (two) times daily. 60 tablet 5   oxymetazoline (AFRIN) 0.05 % nasal spray Place 1 spray into both nostrils at bedtime as needed for congestion.     polyethylene glycol powder (GLYCOLAX/MIRALAX) 17 GM/SCOOP powder Take 17 g by mouth daily as needed for mild constipation. 238 g 0   spironolactone (ALDACTONE) 25 MG tablet Take 1 tablet (25 mg total) by mouth daily. 90 tablet 3   No facility-administered medications prior to visit.     No Known Allergies  Social History   Tobacco Use   Smoking status: Former    Current packs/day: 0.00    Average packs/day: 0.3 packs/day for 35.0 years (8.8 ttl pk-yrs)    Types: Cigarettes    Start date: 09/04/1983    Quit date: 09/04/2018    Years since quitting: 3.9   Smokeless tobacco: Never  Vaping Use   Vaping status: Never Used  Substance Use Topics   Alcohol use: Yes    Alcohol/week: 1.0 standard drink of alcohol    Types: 1 Cans of beer per week    Comment: rare   Drug use: No    Family History  Problem Relation Age of Onset   Colon cancer Mother    Lung cancer Father        smoker   CAD Neg Hx    Pancreatic cancer Neg Hx    Breast cancer Neg Hx     Objective:  There were no vitals filed for this visit. There is no height or weight on file to calculate BMI.  Physical Exam Constitutional:      General: He is not in acute distress.    Appearance: He is normal weight. He is not toxic-appearing.  HENT:     Head: Normocephalic and atraumatic.     Right Ear: External ear normal.     Left Ear: External ear normal.     Nose: No congestion or rhinorrhea.     Mouth/Throat:     Mouth: Mucous membranes are moist.     Pharynx: Oropharynx is clear.  Eyes:     Extraocular Movements: Extraocular  movements intact.     Conjunctiva/sclera: Conjunctivae normal.     Pupils: Pupils are equal, round, and reactive to light.  Cardiovascular:     Rate and Rhythm: Normal rate and regular rhythm.     Heart sounds: No murmur heard.    No friction rub. No gallop.  Pulmonary:     Effort: Pulmonary effort is normal.     Breath  sounds: Normal breath sounds.  Abdominal:     General: Abdomen is flat. Bowel sounds are normal.     Palpations: Abdomen is soft.  Musculoskeletal:        General: No swelling. Normal range of motion.     Cervical back: Normal range of motion and neck supple.  Skin:    General: Skin is warm and dry.  Neurological:     General: No focal deficit present.     Mental Status: He is oriented to person, place, and time.  Psychiatric:        Mood and Affect: Mood normal.     Lab Results: Lab Results  Component Value Date   WBC 9.8 07/14/2022   HGB 11.9 (L) 07/14/2022   HCT 40.0 07/14/2022   MCV 85.7 07/14/2022   PLT 140 (L) 07/14/2022    Lab Results  Component Value Date   CREATININE 0.96 08/13/2022   BUN 13 08/13/2022   NA 138 08/13/2022   K 4.3 08/13/2022   CL 105 08/13/2022   CO2 22 08/13/2022    Lab Results  Component Value Date   ALT 14 06/15/2022   AST 25 06/15/2022   ALKPHOS 74 06/15/2022   BILITOT 1.7 (H) 06/15/2022     Assessment & Plan:  #E faecalis bacteremia with bioprosthetic mitral valve and 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 Randie Heinz, vascular surgery. Vascular surgery following -TEE  showed large vegetation encompassing majority of mitral valve leaflet, aortic bovine valve with small subcentimeter vegetation. -Seen by CTS Dr. Cliffton Asters, noted that given all his  comorbidities the likelihood of subsequent surgical transfer limited.  Recommended medical management and antibiotic therapy at this point. -Discharged on Ampicillin 12 gm q24h continous infusion and ctx 2 gm q12h  x 6 weeks->amox 1gm tid  started on 6/20 fo rpO suppression. I opted for higher dose suppresion given 2 bioprosthetic valce involvement -TTE on 5/28 showed redemonstration of mitral valve prosthetic vegetation and LV chordal redundancy versus likely vegetation.  TTE on 7/5 showed smaller mobile elements on prosthetic mitral valve, abnormal thickness seen on prosthetic aortic valve. Plan: Surveillance blood Cx and labs today Switch to amox 500 tid F/U in 3 monts  Danelle Earthly, MD [facility]   08/19/22  6:06 AM   I have personally spent 35 minutes involved in face-to-face and non-face-to-face activities for this patient on the day of the visit. Professional time spent includes the following activities: Preparing to see the patient (review of tests), Obtaining and/or reviewing separately obtained history (admission/discharge record), Performing a medically appropriate examination and/or evaluation , Ordering medications/tests/procedures, referring and communicating with other health care professionals, Documenting clinical information in the EMR, Independently interpreting results (not separately reported), Communicating results to the patient/family/caregiver, Counseling and educating the patient/family/caregiver and Care coordination (not separately reported).

## 2022-08-20 ENCOUNTER — Ambulatory Visit: Payer: Self-pay

## 2022-08-20 NOTE — Patient Outreach (Signed)
  Care Coordination   Follow Up Visit Note   08/20/2022 Name: [name] MRN: [id number] DOB: May 25, 1950  [name] is a 72 y.o. year old male who sees [name], MD for primary care. I spoke with  [name] by phone today.  What matters to the patients health and wellness today?  Patient would like to continue to recover from endocarditis without complications. He will monitor his BP and weights as directed and increase his water intake.     Goals Addressed             This Visit's Progress    RN Care Coordination Activities: further follow up needed       Care Coordination Interventions: Evaluation of current treatment plan related to acute bacterial endocarditis and patient's adherence to plan as established by provider Review of patient status, including review of consultant's reports, relevant laboratory and other test results, and medications completed Advised patient, providing education and rationale, to monitor blood pressure daily and record, calling PCP for findings outside established parameters Educated patient on the importance of staying well hydrated with water aiming for 48-64 oz daily unless otherwise directed Instructed patient to notify his infectious disease doctor prior to receiving dental care Educated patient with rationale re: importance of ongoing daily weights and advised patient to weigh and record daily, reviewed heart failure self-management plan Discussed the importance of keeping all appointments with provider Reviewed upcoming scheduled follow up with Dr. Gasper Lloyd set for 09/13/22 @1 :40 PM Last practice recorded BP readings:  BP Readings from Last 3 Encounters:  08/19/22 96/63  08/13/22 130/80  07/22/22 131/87   Most recent eGFR/CrCl:  Lab Results  Component Value Date   EGFR 85 08/19/2022    No components found for: "CRCL"     Interventions Today    Flowsheet Row Most Recent Value  Chronic Disease   Chronic disease  during today's visit Congestive Heart Failure (CHF), Other  [acute bacterial endocarditis]  General Interventions   General Interventions Discussed/Reviewed General Interventions Discussed, General Interventions Reviewed, Labs, Doctor Visits, Durable Medical Equipment (DME)  Doctor Visits Discussed/Reviewed Doctor Visits Reviewed, Doctor Visits Discussed, Specialist  Durable Medical Equipment (DME) BP Cuff  Education Interventions   Education Provided Provided Education  Provided Verbal Education On Labs, Nutrition, When to see the doctor, Medication  Labs Reviewed --  [hemoglobin]  Nutrition Interventions   Nutrition Discussed/Reviewed Nutrition Discussed, Fluid intake, Nutrition Reviewed  Pharmacy Interventions   Pharmacy Dicussed/Reviewed Pharmacy Topics Reviewed, Pharmacy Topics Discussed, Medications and their functions          SDOH assessments and interventions completed:  No     Care Coordination Interventions:  Yes, provided   Follow up plan: Follow up call scheduled for 09/17/22 @11 :30 AM    Encounter Outcome:  Pt. Visit Completed

## 2022-08-20 NOTE — Patient Instructions (Signed)
Visit Information  Thank you for taking time to visit with me today. Please don't hesitate to contact me if I can be of assistance to you.   Following are the goals we discussed today:   Goals Addressed             This Visit's Progress    RN Care Coordination Activities: further follow up needed       Care Coordination Interventions: Evaluation of current treatment plan related to acute bacterial endocarditis and patient's adherence to plan as established by provider Review of patient status, including review of consultant's reports, relevant laboratory and other test results, and medications completed Advised patient, providing education and rationale, to monitor blood pressure daily and record, calling PCP for findings outside established parameters Educated patient on the importance of staying well hydrated with water aiming for 48-64 oz daily unless otherwise directed Instructed patient to notify his infectious disease doctor prior to receiving dental care Educated patient with rationale re: importance of ongoing daily weights and advised patient to weigh and record daily, reviewed heart failure self-management plan Discussed the importance of keeping all appointments with provider Reviewed upcoming scheduled follow up with Dr. Gasper Lloyd set for 09/13/22 @1 :40 PM Last practice recorded BP readings:  BP Readings from Last 3 Encounters:  08/19/22 96/63  08/13/22 130/80  07/22/22 131/87   Most recent eGFR/CrCl:  Lab Results  Component Value Date   EGFR 85 08/19/2022    No components found for: "CRCL"           Our next appointment is by telephone on 09/17/22 at 11:30 AM  Please call the care guide team at [phone] if you need to cancel or reschedule your appointment.   If you are experiencing a Mental Health or Behavioral Health Crisis or need someone to talk to, please call 1-800-273-TALK (toll free, 24 hour hotline)  Patient verbalizes understanding of instructions  and care plan provided today and agrees to view in MyChart. Active MyChart status and patient understanding of how to access instructions and care plan via MyChart confirmed with patient.     Delsa Sale, RN, BSN, CCM Care Management Coordinator [facility] Care Management  Direct Phone: [phone]

## 2022-08-23 ENCOUNTER — Ambulatory Visit (HOSPITAL_COMMUNITY)
Admission: RE | Admit: 2022-08-23 | Discharge: 2022-08-23 | Disposition: A | Discharge status: Home / Self Care | Payer: Medicare HMO | Source: Ambulatory Visit | Attending: Cardiology | Admitting: Cardiology

## 2022-08-23 DIAGNOSIS — I5032 Chronic diastolic (congestive) heart failure: Secondary | ICD-10-CM | POA: Diagnosis not present

## 2022-08-23 LAB — BASIC METABOLIC PANEL
Anion gap: 15 (ref 5–15)
BUN: 18 mg/dL (ref 8–23)
CO2: 21 mmol/L — ABNORMAL LOW (ref 22–32)
Calcium: 9.6 mg/dL (ref 8.9–10.3)
Chloride: 101 mmol/L (ref 98–111)
Creatinine, Ser: 1.22 mg/dL (ref 0.61–1.24)
GFR, Estimated: >60 mL/min (ref >60)
Glucose, Bld: 110 mg/dL — ABNORMAL HIGH (ref 70–99)
Potassium: 4.1 mmol/L (ref 3.5–5.1)
Sodium: 137 mmol/L (ref 135–145)

## 2022-08-25 LAB — CULTURE, BLOOD (SINGLE)
MICRO NUMBER:: 15189164
MICRO NUMBER:: 15189165
Result:: NO GROWTH
Result:: NO GROWTH
SPECIMEN QUALITY:: ADEQUATE
SPECIMEN QUALITY:: ADEQUATE

## 2022-08-25 LAB — C-REACTIVE PROTEIN: CRP: 17.1 mg/L — ABNORMAL HIGH (ref <8.0)

## 2022-08-25 LAB — COMPLETE METABOLIC PANEL WITH GFR
AG Ratio: 1.1 (calc) (ref 1.0–2.5)
ALT: 9 U/L (ref 9–46)
AST: 14 U/L (ref 10–35)
Albumin: 3.8 g/dL (ref 3.6–5.1)
Alkaline phosphatase (APISO): 100 U/L (ref 35–144)
BUN: 15 mg/dL (ref 7–25)
CO2: 27 mmol/L (ref 20–32)
Calcium: 9.4 mg/dL (ref 8.6–10.3)
Chloride: 102 mmol/L (ref 98–110)
Creat: 0.96 mg/dL (ref 0.70–1.28)
Globulin: 3.5 g/dL (calc) (ref 1.9–3.7)
Glucose, Bld: 157 mg/dL — ABNORMAL HIGH (ref 65–99)
Potassium: 3.8 mmol/L (ref 3.5–5.3)
Sodium: 141 mmol/L (ref 135–146)
Total Bilirubin: 0.8 mg/dL (ref 0.2–1.2)
Total Protein: 7.3 g/dL (ref 6.1–8.1)
eGFR: 85 mL/min/{1.73_m2} (ref >=60)

## 2022-08-25 LAB — CBC WITH DIFFERENTIAL/PLATELET
Absolute Monocytes: 654 cells/uL (ref 200–950)
Basophils Absolute: 69 cells/uL (ref 0–200)
Basophils Relative: 0.8 %
Eosinophils Absolute: 60 cells/uL (ref 15–500)
Eosinophils Relative: 0.7 %
HCT: 40.3 % (ref 38.5–50.0)
Hemoglobin: 12.4 g/dL — ABNORMAL LOW (ref 13.2–17.1)
Lymphs Abs: 1058 cells/uL (ref 850–3900)
MCH: 25 pg — ABNORMAL LOW (ref 27.0–33.0)
MCHC: 30.8 g/dL — ABNORMAL LOW (ref 32.0–36.0)
MCV: 81.3 fL (ref 80.0–100.0)
MPV: 11.9 fL (ref 7.5–12.5)
Monocytes Relative: 7.6 %
Neutro Abs: 6760 cells/uL (ref 1500–7800)
Neutrophils Relative %: 78.6 %
Platelets: 214 10*3/uL (ref 140–400)
RBC: 4.96 10*6/uL (ref 4.20–5.80)
RDW: 15.5 % — ABNORMAL HIGH (ref 11.0–15.0)
Total Lymphocyte: 12.3 %
WBC: 8.6 10*3/uL (ref 3.8–10.8)

## 2022-08-25 LAB — SEDIMENTATION RATE: Sed Rate: 19 mm/h (ref 0–20)

## 2022-09-01 NOTE — Progress Notes (Signed)
Remote pacemaker transmission.   

## 2022-09-13 ENCOUNTER — Encounter (HOSPITAL_COMMUNITY): Payer: Self-pay | Admitting: Cardiology

## 2022-09-13 ENCOUNTER — Ambulatory Visit (HOSPITAL_COMMUNITY)
Admission: RE | Admit: 2022-09-13 | Discharge: 2022-09-13 | Disposition: A | Discharge status: Home / Self Care | Payer: Medicare HMO | Source: Ambulatory Visit | Attending: Cardiology | Admitting: Cardiology

## 2022-09-13 DIAGNOSIS — Z950 Presence of cardiac pacemaker: Secondary | ICD-10-CM | POA: Insufficient documentation

## 2022-09-13 DIAGNOSIS — Z79899 Other long term (current) drug therapy: Secondary | ICD-10-CM | POA: Insufficient documentation

## 2022-09-13 DIAGNOSIS — I5032 Chronic diastolic (congestive) heart failure: Secondary | ICD-10-CM | POA: Diagnosis not present

## 2022-09-13 DIAGNOSIS — I342 Nonrheumatic mitral (valve) stenosis: Secondary | ICD-10-CM | POA: Diagnosis not present

## 2022-09-13 DIAGNOSIS — Z951 Presence of aortocoronary bypass graft: Secondary | ICD-10-CM | POA: Diagnosis not present

## 2022-09-13 DIAGNOSIS — T826XXD Infection and inflammatory reaction due to cardiac valve prosthesis, subsequent encounter: Secondary | ICD-10-CM | POA: Insufficient documentation

## 2022-09-13 DIAGNOSIS — Z7901 Long term (current) use of anticoagulants: Secondary | ICD-10-CM | POA: Insufficient documentation

## 2022-09-13 DIAGNOSIS — I330 Acute and subacute infective endocarditis: Secondary | ICD-10-CM | POA: Diagnosis not present

## 2022-09-13 DIAGNOSIS — I2510 Atherosclerotic heart disease of native coronary artery without angina pectoris: Secondary | ICD-10-CM | POA: Diagnosis not present

## 2022-09-13 DIAGNOSIS — I442 Atrioventricular block, complete: Secondary | ICD-10-CM | POA: Insufficient documentation

## 2022-09-13 DIAGNOSIS — Z953 Presence of xenogenic heart valve: Secondary | ICD-10-CM | POA: Insufficient documentation

## 2022-09-13 DIAGNOSIS — Z87891 Personal history of nicotine dependence: Secondary | ICD-10-CM | POA: Insufficient documentation

## 2022-09-13 DIAGNOSIS — I361 Nonrheumatic tricuspid (valve) insufficiency: Secondary | ICD-10-CM | POA: Diagnosis not present

## 2022-09-13 DIAGNOSIS — B952 Enterococcus as the cause of diseases classified elsewhere: Secondary | ICD-10-CM | POA: Insufficient documentation

## 2022-09-13 LAB — BASIC METABOLIC PANEL
Anion gap: 12 (ref 5–15)
BUN: 17 mg/dL (ref 8–23)
CO2: 23 mmol/L (ref 22–32)
Calcium: 9.6 mg/dL (ref 8.9–10.3)
Chloride: 100 mmol/L (ref 98–111)
Creatinine, Ser: 1.19 mg/dL (ref 0.61–1.24)
GFR, Estimated: >60 mL/min (ref >60)
Glucose, Bld: 115 mg/dL — ABNORMAL HIGH (ref 70–99)
Potassium: 4.3 mmol/L (ref 3.5–5.1)
Sodium: 135 mmol/L (ref 135–145)

## 2022-09-13 LAB — BRAIN NATRIURETIC PEPTIDE: B Natriuretic Peptide: 255.7 pg/mL — ABNORMAL HIGH (ref 0.0–100.0)

## 2022-09-13 LAB — HEMOGLOBIN A1C
Hgb A1c MFr Bld: 6.7 % — ABNORMAL HIGH (ref 4.8–5.6)
Mean Plasma Glucose: 145.59 mg/dL

## 2022-09-13 LAB — DIGOXIN LEVEL: Digoxin Level: 0.6 ng/mL — ABNORMAL LOW (ref 0.8–2.0)

## 2022-09-13 MED ORDER — LOSARTAN POTASSIUM 25 MG PO TABS: 12.5000 mg | ORAL_TABLET | Freq: Every evening | ORAL | 3 refills | Status: DC

## 2022-09-13 MED ORDER — METOPROLOL TARTRATE 50 MG PO TABS
75.0000 mg | ORAL_TABLET | Freq: Two times a day (BID) | ORAL | 6 refills | Status: DC
Start: 2022-09-13 — End: 2022-10-07

## 2022-09-13 NOTE — Progress Notes (Addendum)
ADVANCED HEART FAILURE CLINIC NOTE  Referring Physician: [name], MD  Primary Care: Michael Nian, MD   HPI: [name] is a 72 y.o. male with coronary artery disease status post CABG x 2 with left atrial appendage clipping and PFO closure, severe aortic stenosis and mitral stenosis status post bioprosthetic AVR and MVR, bioprosthetic mitral valve endocarditis (currently on IV antibiotics), hyperlipidemia, complete heart block status post permanent pacemaker, history of atrial flutter currently presenting today for follow up.. Based on chart review prior to his bioprosthetic AVR and MVR [name] had preserved LV and RV function.  Shortly after the procedure he had a pacemaker placed for complete heart block.  Since that time echocardiograms demonstrate enlarging right ventricle with progressively worsening tricuspid regurgitation.    In May 2024, he presented with left upper extremity ischemia with acute occlusion of the left proximal brachial artery status post thromboembolectomy by vascular surgery.  During that time he had a TEE which demonstrated severe bioprosthetic mitral valve endocarditis; large vegetation on mitral valve leaflets.  Blood cultures were positive for Enterococcus faecalis.  He was diuresed with IV Lasix and discharged home on ampicillin and Rocephin for 6 weeks (starting 06/18/2022).    Interval hx:  - Continues to do remarkably well since discharge from the follow up and gradual uptitration of medical therapy. Now able to perform ADLs without any limitations; increasing his walk distance; driving short distances. Walking up to 40-60ft without significant dyspnea. PICC line also removed after completion of IV antibiotics.   Activity level/exercise tolerance: NYHA IIB Orthopnea:  Sleeps on 2 pillows Paroxysmal noctural dyspnea:  no Chest pain/pressure:  no Orthostatic lightheadedness:  no Palpitations:  no Lower extremity edema:  yes but  resolved Presyncope/syncope:  no Cough:  no  Past Medical History:  Diagnosis Date   BCE (basal cell epithelioma), arm    RIGHT SHOULDER   Bradycardia    Bumps on skin    on left side of nose for last 3 weeks   CAD (coronary artery disease)    a. s/p CABGx2 10/11/18 LIMA to LAD, SVG to RCA, EVH via right thigh.   Dyslipidemia    Ectatic thoracic aorta (HCC)    Esophageal stricture    GERD (gastroesophageal reflux disease)    Heart murmur    sees dr Rose Fillers   Hyperlipidemia    Incidental pulmonary nodule, > 3mm and < 8mm 10/04/2018   Noted on CTA   Inguinal hernia    bilateral   Obesity    Presence of permanent cardiac pacemaker    Prostate cancer (HCC)    S/P aortic valve replacement with bioprosthetic valve 10/11/2018   23 mm Edwards Inspiris Resilia stented bovine pericardial tissue valve, miltral valve done also   S/P CABG x 2 10/11/2018   LIMA to LAD, SVG to RCA, EVH via right thigh   S/P mitral valve replacement with bioprosthetic valve 10/11/2018   27 mm Medtronic Mosaic stented porcine bioprosthetic tissue valve   S/P patent foramen ovale closure 10/11/2018   S/P placement of cardiac pacemaker    Smoker     Current Outpatient Medications  Medication Sig Dispense Refill   acetaminophen (TYLENOL) 325 MG tablet Take 2 tablets (650 mg total) by mouth every 4 (four) hours as needed for moderate pain.     amoxicillin (AMOXIL) 500 MG capsule Take 1 capsule (500 mg total) by mouth 3 (three) times daily. 90 capsule 5   amoxicillin (AMOXIL) 500 MG  tablet Take 500 mg by mouth 3 (three) times daily.     apixaban (ELIQUIS) 5 MG TABS tablet TAKE 1 TABLET BY MOUTH TWICE DAILY 60 tablet 11   atorvastatin (LIPITOR) 20 MG tablet Take 1 tablet (20 mg total) by mouth daily. 90 tablet 3   clotrimazole (LOTRIMIN) 1 % cream Apply 1 Application topically 2 (two) times daily. 30 g 0   digoxin (LANOXIN) 0.125 MG tablet Take 1 tablet (0.125 mg total) by mouth daily. 90 tablet 3   empagliflozin  (JARDIANCE) 10 MG TABS tablet Take 1 tablet (10 mg total) by mouth daily before breakfast. 30 tablet 11   ferrous sulfate 325 (65 FE) MG tablet Take 325 mg by mouth daily with breakfast.     furosemide (LASIX) 20 MG tablet Take 1 tablet (20 mg total) by mouth daily. 30 tablet 11   metoprolol tartrate (LOPRESSOR) 50 MG tablet Take 1 tablet (50 mg total) by mouth 2 (two) times daily. 60 tablet 5   oxymetazoline (AFRIN) 0.05 % nasal spray Place 1 spray into both nostrils at bedtime as needed for congestion.     polyethylene glycol powder (GLYCOLAX/MIRALAX) 17 GM/SCOOP powder Take 17 g by mouth daily as needed for mild constipation. 238 g 0   spironolactone (ALDACTONE) 25 MG tablet Take 1 tablet (25 mg total) by mouth daily. 90 tablet 3   No current facility-administered medications for this encounter.    No Known Allergies    Social History   Socioeconomic History   Marital status: Married    Spouse name: Not on file   Number of children: 0   Years of education: Not on file   Highest education level: Not on file  Occupational History   Occupation: Environmental manager    Employer: [employer]  Tobacco Use   Smoking status: Former    Average packs/day: 0.3 packs/day for 40.6 years (14.0 ttl pk-yrs)    Types: Cigarettes    Start date: 02/08/1978    Passive exposure: Never   Smokeless tobacco: Never  Vaping Use   Vaping status: Never Used  Substance and Sexual Activity   Alcohol use: Yes    Alcohol/week: 1.0 standard drink of alcohol    Types: 1 Cans of beer per week    Comment: rare   Drug use: No   Sexual activity: Yes  Other Topics Concern   Not on file  Social History Narrative   Not on file   Social Determinants of Health   Financial Resource Strain: Low Risk  (05/11/2022)   Overall Financial Resource Strain (CARDIA)    Difficulty of Paying Living Expenses: Not hard at all  Food Insecurity: No Food Insecurity (07/15/2022)   Hunger Vital Sign    Worried About Running Out of Food in  the Last Year: Never true    Ran Out of Food in the Last Year: Never true  Transportation Needs: No Transportation Needs (06/24/2022)   PRAPARE - Administrator, Civil Service (Medical): No    Lack of Transportation (Non-Medical): No  Physical Activity: Inactive (05/11/2022)   Exercise Vital Sign    Days of Exercise per Week: 0 days    Minutes of Exercise per Session: 0 min  Stress: No Stress Concern Present (05/11/2022)   [employer] of Occupational Health - Occupational Stress Questionnaire    Feeling of Stress : Not at all  Social Connections: Not on file  Intimate Partner Violence: Not At Risk (06/16/2022)   Humiliation, Afraid, Rape, and  Kick questionnaire    Fear of Current or Ex-Partner: No    Emotionally Abused: No    Physically Abused: No    Sexually Abused: No      Family History  Problem Relation Age of Onset   Colon cancer Mother    Lung cancer Father        smoker   CAD Neg Hx    Pancreatic cancer Neg Hx    Breast cancer Neg Hx     PHYSICAL EXAM: Vitals:   09/13/22 1327  BP: 128/84  Pulse: 87  SpO2: 97%   GENERAL: Well nourished, well developed, and in no apparent distress at rest.  HEENT: Negative for arcus senilis or xanthelasma. There is no scleral icterus.  The mucous membranes are pink and moist.   NECK: Supple, No masses. Normal carotid upstrokes without bruits. No masses or thyromegaly.    CHEST: There are no chest wall deformities. There is no chest wall tenderness. Respirations are unlabored.  Lungs- CTA B/L CARDIAC:  JVP: 7 cm          Normal rate with regular rhythm. 2/6 diastolic murmur at apex, rubs or gallops.  Pulses are 2+ and symmetrical in upper and lower extremities. No edema.  ABDOMEN: Soft, non-tender, non-distended. There are no masses or hepatomegaly. There are normal bowel sounds.  EXTREMITIES: Warm and well perfused with no cyanosis, clubbing.  LYMPHATIC: No axillary or supraclavicular lymphadenopathy.  NEUROLOGIC:  Patient is oriented x3 with no focal or lateralizing neurologic deficits.  PSYCH: Patients affect is appropriate, there is no evidence of anxiety or depression.  SKIN: Warm and dry; no lesions or wounds.     DATA REVIEW  ECG: 06/03/22: NSR  as per my personal interpretation  ECHO: 07/06/22: LVEF 50-55%, large vegetation on bioprosthetic mitral valve; dilated RV.  06/16/22 (TEE): LVEF 50-55%, 4+ TR, 2x2cm vegetation on bioprosthetic mitral valve.  05/23/22: Normal LV function, severely dilated RV with moderately reduced function, moderate stenosis of prosthesis (gradient ), severe TR due to poor coaptation of leaflets, s/p AVR as per my personal interpretation 06/13/19: Normal LV function with septal bowing to the left; dilated RV with mildly reduced function. Mitral bioprosthesis gradient of 06/27/18: Normal LV function, normal RV function. Severe MS/AS with moderate AI.    CATH: 05/25/22:   Prox LAD lesion is 40% stenosed.   Mid LAD lesion is 95% stenosed.   Prox Cx to Mid Cx lesion is 30% stenosed.   1st Mrg lesion is 30% stenosed.   3rd Mrg lesion is 30% stenosed.   Prox RCA lesion is 100% stenosed.   SVG graft was visualized by angiography and is normal in caliber.   LIMA graft was visualized by angiography and is normal in caliber.   Severe mid LAD stenosis. Competitive filling is seen distally from the patent LIMA graft.  Patent LIMA graft to the mid LAD.  Mild non-obstructive disease in the Circumflex artery.  Large dominant RCA with chronic proximal occlusion. The mid and distal RCA fills from the patent vein graft.  Near normal right and left heart pressures.  CO 6.92 L/min CI 3.33  09/15/2018: Prox RCA lesion is 100% stenosed. Prox Cx to Mid Cx lesion is 30% stenosed. 1st Mrg lesion is 30% stenosed. 3rd Mrg lesion is 30% stenosed. Prox LAD lesion is 40% stenosed. Mid LAD lesion is 95% stenosed.   1. Severe stenosis mid LAD 2. Mild non-obstructive disease in  the LAD 3. Chronic occlusion of the proximal  RCA. Filling of the proximal, mid and distal RCA from left to right collaterals 4. Severe aortic stenosis (mean gradient 34.1 mmHg, peak to peak gradient 32 mmHg, AVA 1.1 cm2) 5. Severe mitral stenosis by echo.     ASSESSMENT & PLAN:   1. RV dysfunction - RHC hemodynamics from 05/25/22 personally interpretted:  RA 10 (large V waves), RV 28/6-10, PA 25/15, PCWP 14,  -RV dysfunction is now likely multifactorial: he is chronically RV paced with device interrogation from 05/15/22 demonstrating 96% RV pacing, he has a dilated RV leading to poor coaptation of the tricuspid valve and as a result severe TR and in addition has likely moderate to severe stenosis of his bioprosthetic mitral valve. From a hemodynamic standpoint though the MS does not appear to be having a significant effect on his PA pressures or PCWP.  - Prior to MVR/AVR, his LV function and RV function were intact. Since that time, serial echocardiograms demonstrate worsening TR and RV dilation. Although he has moderate to severe bioprosthetic MS it has not impacted his PA pressures or PCWP enough to compromise RV function to this point. RV dilation may be secondary to chronic RV pacing leading to TR due to poor valve coaptation.  - Euvolemic on exam, continue lasix 20mg  daily.  - In the setting of recently diagnosed endocarditis he is high risk for any procedures; doing very well functionally. Will continue to medically manage.  - continue spironolactone 25mg  daily - start losartan 12.5mg  at bedtime -Increase metoprolol tartrate to 75mg  BID; will transition to toprol 100mg  at follow up.  - continue digoxin , continue jardiance 10mg . Repeat digoxin level today.   2. Bioprosthetic mitral valve endocarditis & stenosis - TEE on 06/16/22 with severe bioprosthetic mitral valve endocarditis - Repeat TTE on 5/28 with persistent vegetation.  - Repeat TTE today w/ mild to moderately reduced RV  function; mitral valve stenosis remains severe with thickening of the mitral valve.  - Completed 6 weeks of IV antibiotics. PICC line removed.  - Repeat TTE with thickening of mitral valve; seen by ID with repeat cultures no growth. Will continue to monitor.  - continue metoprolol to 50mg  BID.   3. Severe TR - Appears functional and secondary to RV dilation leading to poor coaptation. See above.   4. CAD s/p CABG x 2, closure of PFO - AVR, MVR and CABG x 2 (LIMA-LAD, SVG to RCA) in 10/11/2018 - metoprolol 75mg  BID  5. pAFL - continue metoprolol - apixaban 5mg  BID  6. Left brachial artery occlusion  - due to mitral valve endocarditis - adequate pulses b/l today  7. Healthcare maintenance - A1C today.    Advanced Heart Failure Mechanical Circulatory Support

## 2022-09-13 NOTE — Patient Instructions (Addendum)
Good to see you today!  INCREASE Metoprolol to 75 mg (1 1/2 tablet) Twice daily  START Losartan 12.5 mg ( 1/2 tablet) nightly  Your physician recommends that you schedule a follow-up appointment in: 2 months(October) Call office mid August to schedule an apoointment  Labs done today, your results will be available in MyChart, we will contact you for abnormal readings.   If you have any questions or concerns before your next appointment please send [place] a message through [place] or call our office at [phone].    TO LEAVE A MESSAGE FOR THE NURSE SELECT OPTION 2, PLEASE LEAVE A MESSAGE INCLUDING: YOUR NAME DATE OF BIRTH CALL BACK NUMBER REASON FOR CALL**this is important as we prioritize the call backs  YOU WILL RECEIVE A CALL BACK THE SAME DAY AS LONG AS YOU CALL BEFORE 4:00 PM  At the [facility], you and your health needs are our priority. As part of our continuing mission to provide you with exceptional heart care, we have created designated Provider Care Teams. These Care Teams include your primary Cardiologist (physician) and Advanced Practice Providers (APPs- Physician Assistants and Nurse Practitioners) who all work together to provide you with the care you need, when you need it.   You may see any of the following providers on your designated Care Team at your next follow up: Dr Arvilla Meres Dr Marca Ancona Dr. Marcos Eke, NP Robbie Lis, [place] [facility] [place], [place] Brynda Peon, NP Karle Plumber, PharmD   Please be sure to bring in all your medications bottles to every appointment.    Thank you for choosing [facility]

## 2022-09-17 ENCOUNTER — Ambulatory Visit: Payer: Self-pay

## 2022-09-17 NOTE — Patient Instructions (Addendum)
Visit Information  Thank you for taking time to visit with me today. Please don't hesitate to contact me if I can be of assistance to you.   Following are the goals we discussed today:   Goals Addressed             This Visit's Progress    RN Care Coordination Activities: further follow up needed       Care Coordination Interventions: Evaluation of current treatment plan related to acute bacterial endocarditis and patient's adherence to plan as established by provider Review of patient status, including review of consultant's reports, relevant laboratory and other test results, and medications completed INCREASE Metoprolol to 75 mg (1 1/2 tablet) Twice daily START Losartan 12.5 mg ( 1/2 tablet) nightly Advised patient, providing education and rationale, to monitor blood pressure daily and record, calling PCP for findings outside established parameters Reviewed medications and discussed potential side effects of medications such as dizziness  Educated patient about signs and symptoms suggestive of orthostatic hypotension, educated on fall precautions  Re-educated patient on the importance of staying well hydrated with water aiming for 48-64 oz daily unless otherwise directed Educated on the importance of balancing his activity with rest Reviewed next scheduled/upcoming provider appointment including: Cardiology follow up with Perlie Gold PA-C scheduled for 11/09/22 @1 :30 PM        Our next appointment is by telephone on 11/11/22 at 2:00 PM  Please call the care guide team at [phone] if you need to cancel or reschedule your appointment.   If you are experiencing a Mental Health or Behavioral Health Crisis or need someone to talk to, please call 1-800-273-TALK (toll free, 24 hour hotline)  Patient verbalizes understanding of instructions and care plan provided today and agrees to view in MyChart. Active MyChart status and patient understanding of how to access instructions and  care plan via MyChart confirmed with patient.     Delsa Sale, RN, BSN, CCM Care Management Coordinator [facility] Care Management Direct Phone: [phone]

## 2022-09-17 NOTE — Patient Outreach (Signed)
  Care Coordination   Follow Up Visit Note   09/17/2022 Name: [name] MRN: [id number] DOB: 1950-08-07  [name] is a 72 y.o. year old male who sees [name], MD for primary care. I spoke with  [name] by phone today.  What matters to the patients health and wellness today?  Patient will continue to monitor his BP. He would like to return to his normal routines.     Goals Addressed             This Visit's Progress    RN Care Coordination Activities: further follow up needed       Care Coordination Interventions: Evaluation of current treatment plan related to acute bacterial endocarditis and patient's adherence to plan as established by provider Review of patient status, including review of consultant's reports, relevant laboratory and other test results, and medications completed INCREASE Metoprolol to 75 mg (1 1/2 tablet) Twice daily START Losartan 12.5 mg ( 1/2 tablet) nightly Advised patient, providing education and rationale, to monitor blood pressure daily and record, calling PCP for findings outside established parameters Reviewed medications and discussed potential side effects of medications such as dizziness  Educated patient about signs and symptoms suggestive of orthostatic hypotension, educated on fall precautions  Re-educated patient on the importance of staying well hydrated with water aiming for 48-64 oz daily unless otherwise directed Educated on the importance of balancing his activity with rest Reviewed next scheduled/upcoming provider appointment including: Cardiology follow up with Perlie Gold PA-C scheduled for 11/09/22 @1 :30 PM    Interventions Today    Flowsheet Row Most Recent Value  Chronic Disease   Chronic disease during today's visit Other, Hypertension (HTN)  [acute bacterial endocarditis]  General Interventions   General Interventions Discussed/Reviewed General Interventions Discussed, General Interventions Reviewed, Doctor  Visits, Labs  Doctor Visits Discussed/Reviewed Doctor Visits Discussed, Doctor Visits Reviewed, Specialist  Education Interventions   Education Provided Provided Education  Provided Verbal Education On Medication, Exercise, Nutrition, Labs, When to see the doctor  Nutrition Interventions   Nutrition Discussed/Reviewed Nutrition Reviewed, Nutrition Discussed, Fluid intake, Decreasing salt  Pharmacy Interventions   Pharmacy Dicussed/Reviewed Pharmacy Topics Reviewed, Pharmacy Topics Discussed, Medications and their functions          SDOH assessments and interventions completed:  No     Care Coordination Interventions:  Yes, provided   Follow up plan: Follow up call scheduled for 11/11/22 @2 :00 PM    Encounter Outcome:  Pt. Visit Completed

## 2022-09-30 ENCOUNTER — Telehealth: Payer: Self-pay

## 2022-09-30 ENCOUNTER — Telehealth (HOSPITAL_COMMUNITY): Payer: Self-pay | Admitting: Cardiology

## 2022-09-30 NOTE — Telephone Encounter (Signed)
Patient called to report rash since starting losartan and increase in metoprolol 8/5   Splotchy  rash all over back, face, chest Not itchy Not red in color  Denies new lotion or environmental changes Denies dietary changes  Only thing new is meds as above   Reports similar event in the past-given cream by dermatologist-has used with little relief.  Wanted to notify provider in the event rash does not go away

## 2022-09-30 NOTE — Telephone Encounter (Signed)
Pharmacy please advise on holding Eliquis prior to colonoscopy scheduled for TBD. Thank you.   

## 2022-09-30 NOTE — Telephone Encounter (Signed)
...     Pre-operative Risk Assessment    Patient Name: [name]  DOB: [birth year] MRN: [id number]  LAST O/V [facility] 10/13/21 LAST O/V CHF 09/13/22 NEXT O/V 11/09/22    Request for Surgical Clearance    Procedure:   COLONOSCOPY/ENDOSCOPY  Date of Surgery:  Clearance TBD                                 Surgeon:  DR Levora Angel Surgeon's Group or Practice Name:  [facility] Phone number:  [phone] Fax number:  [phone]   Type of Clearance Requested:   - Medical  - Pharmacy:  Hold Apixaban (Eliquis)     Type of Anesthesia:   PROPOFOL   Additional requests/questions:    Jola Babinski   09/30/2022, 11:56 AM

## 2022-10-01 NOTE — Telephone Encounter (Signed)
Patient with diagnosis of aflutter on Eliquis for anticoagulation.    Procedure: COLONOSCOPY/ENDOSCOPY  Date of procedure: TBD   CHA2DS2-VASc Score = 3   This indicates a 3.2% annual risk of stroke. The patient's score is based upon: CHF History: 0 HTN History: 1 Diabetes History: 0 Stroke History: 0 Vascular Disease History: 1 Age Score: 1 Gender Score: 0      CrCl 60 ml/min Platelet count 214  Per office protocol, patient can hold Eliquis for 2 days prior to procedure.    **This guidance is not considered finalized until pre-operative APP has relayed final recommendations.**

## 2022-10-01 NOTE — Telephone Encounter (Signed)
Follow up call Reports rash has gotten better Used cream given by derm in the past Also made call to derm-recommended  use of the cream   Given instructions to hold losartan if needed   Voiced understanding-appreciative of return call

## 2022-10-01 NOTE — Telephone Encounter (Signed)
If rash does not resolve on its own, he can try holding the losartan for a week and assessing if it improves.

## 2022-10-01 NOTE — Telephone Encounter (Signed)
   Name: [name]  DOB: [birth year]  MRN: [id number]  Primary Cardiologist: Verne Carrow, MD  Chart reviewed as part of pre-operative protocol coverage. The patient has an upcoming visit scheduled with Perlie Gold, PA on 11/09/2022 at which time clearance can be addressed in case there are any issues that would impact surgical recommendations.  . I added preop FYI to appointment note so that provider is aware to address at time of outpatient visit.  Per office protocol the cardiology provider should forward their finalized clearance decision and recommendations regarding antiplatelet therapy to the requesting party below.    I will route this message as FYI to requesting party and remove this message from the preop box as separate preop APP input not needed at this time.   Please call with any questions.  Napoleon Form, Leodis Rains, NP  10/01/2022, 2:55 PM

## 2022-10-05 ENCOUNTER — Other Ambulatory Visit (HOSPITAL_COMMUNITY): Payer: Self-pay | Admitting: Cardiology

## 2022-10-05 DIAGNOSIS — I5032 Chronic diastolic (congestive) heart failure: Secondary | ICD-10-CM

## 2022-10-13 ENCOUNTER — Telehealth (HOSPITAL_COMMUNITY): Payer: Self-pay | Admitting: Cardiology

## 2022-10-13 NOTE — Telephone Encounter (Signed)
Patient called to inquire about Claritin OTC HF OTC meds reviewed with patient See below Nothing further needed at this time. Patient appreciative of return call     [facility] Guidelines for over-the-counter treatments for mild illnesses  Medications You can Use  Brand Name Generic Name Uses Comments  Benadryl Diphenhydramine Cold/allergy symptoms Do not use any Bendaryl products that say "congestion"  Loratadine Claritin Cold/allergy symptoms --  Coricidin HBP Varies with formulation Cold/flu  symptoms --  Mussinex Guaifenesin Mucus removal  --  Delsym Dextromethorphan Cough suppression --  Robitussin Dextromethorphan and guaifenesin Cough suppression + mucus removal Do not use robitussin products that include "PE", "PM," or "CF"  Vicks VapoRub Camphor, eucalyptus oil, menthol Cough and congestion  --  Vicks 44 Varies with formulation Cough suppression Do not use Vicks 44 D  Saline Nasal Spray Saline Congestion relief --   Medications to AVOID  Brand Name Generic Name  Alka-Seltzer Calcium carbonate + aspirin  Sudafed Pseudoephedrine  Comtrex Acetaminophen + pseudoephedrine  Theraflu Acetaminophen + phenylephrine  Dimetapp Brompheniramine + dextromethorphan + phenylephrine  Medicated nasal sprays Oxymetazoline, phenylephrine, etc.  Drixoral Dexbrompheniramine + pseudoephedrine  Chlor-Trimeton Chlorpheniramine  *AVOID AS THESE MEDICATIONS CAN CAUSE HIGH BLOOD PRESSURE AND HEART RATE  Common ingredients to avoid: pseudoephedrine, epinephrine, phenylephrine, brompheniramine, chlorpheniramine (ALWAYS READ LABLES CAREFULLY)  THIS IS NOT A COMPREHENSIVE LIST - If unsure about a medicine, PLEASE CALL AND ASK!

## 2022-11-09 ENCOUNTER — Ambulatory Visit: Payer: Medicare HMO | Admitting: Cardiology

## 2022-11-09 ENCOUNTER — Encounter: Payer: Self-pay | Admitting: Cardiology

## 2022-11-11 ENCOUNTER — Ambulatory Visit: Payer: Self-pay

## 2022-11-11 NOTE — Patient Instructions (Signed)
Visit Information  Thank you for taking time to visit with me today. Please don't hesitate to contact me if I can be of assistance to you.   Following are the goals we discussed today:   Goals Addressed             This Visit's Progress    RN Care Coordination Activities: further follow up needed   On track    Care Coordination Interventions: Evaluation of current treatment plan related to acute bacterial endocarditis and patient's adherence to plan as established by provider Determined patient is progressing towards wellness, as evidence by he is feeling stronger and states in the past week he has been more physically active  Educated patient about the PREP program and encouraged him to discuss this with his cardiologist at next scheduled visit if he is interested Reviewed and discussed with patient his next scheduled visit with Cardiologist, Dr. Gasper Lloyd is scheduled for 11/19/22 @11 :00 AM Mailed printed brochure related to the PREP program      To start monitoring cbgs and lower A1c       Care Coordination Interventions: Provided education to patient about basic DM disease process Counseled on importance of regular laboratory monitoring as prescribed Provided patient with written educational materials related to hypo and hyperglycemia and importance of correct treatment Advised patient, providing education and rationale, to check cbg daily before breakfast and at bedtime and record, calling PCP for findings outside established parameters Review of patient status, including review of consultants reports, relevant laboratory and other test results, and medications completed Counseled on Diabetic diet, my plate method, 161 minutes of moderate intensity exercise/week Discussed plans with patient for ongoing care coordination follow up and provided patient with direct contact information for nurse care coordinator Sent in basket message to Dr. Susann Givens and Kristian Covey PA regarding need  for a dm f/u with patient and Rx for new glucometer Assessed patient's understanding of A1c goal: <7% Lab Results  Component Value Date   HGBA1C 6.7 (H) 09/13/2022           Our next appointment is by telephone on 01/11/23 at 11:00 AM  Please call the care guide team at [phone] if you need to cancel or reschedule your appointment.   If you are experiencing a Mental Health or Behavioral Health Crisis or need someone to talk to, please call 1-800-273-TALK (toll free, 24 hour hotline)  Patient verbalizes understanding of instructions and care plan provided today and agrees to view in MyChart. Active MyChart status and patient understanding of how to access instructions and care plan via MyChart confirmed with patient.     Delsa Sale RN BSN CCM [place]  [facility], [place] [facility] Nurse Care Coordinator  Direct Dial: [phone] Website: [email or web address]

## 2022-11-11 NOTE — Patient Outreach (Addendum)
Care Coordination   Follow Up Visit Note   11/11/2022 Name: [name] MRN: [id number] DOB: Aug 25, 1950  [name] is a 72 y.o. year old male who sees [name], MD for primary care. I spoke with  [name] by phone today.  What matters to the patients health and wellness today?  Patient will f/u with PCP for a dm check up. He will start monitoring his blood sugars at home.     Goals Addressed             This Visit's Progress    RN Care Coordination Activities: further follow up needed   On track    Care Coordination Interventions: Evaluation of current treatment plan related to acute bacterial endocarditis and patient's adherence to plan as established by provider Determined patient is progressing towards wellness, as evidence by he is feeling stronger and states in the past week he has been more physically active  Educated patient about the PREP program and encouraged him to discuss this with his cardiologist at next scheduled visit if he is interested Reviewed and discussed with patient his next scheduled visit with Cardiologist, Dr. Gasper Lloyd is scheduled for 11/19/22 @11 :00 AM Mailed printed brochure related to the PREP program      To start monitoring cbgs and lower A1c       Care Coordination Interventions: Provided education to patient about basic DM disease process Counseled on importance of regular laboratory monitoring as prescribed Provided patient with written educational materials related to hypo and hyperglycemia and importance of correct treatment Advised patient, providing education and rationale, to check cbg daily before breakfast and at bedtime and record, calling PCP for findings outside established parameters Review of patient status, including review of consultants reports, relevant laboratory and other test results, and medications completed Counseled on Diabetic diet, my plate method, 578 minutes of moderate intensity  exercise/week Discussed plans with patient for ongoing care coordination follow up and provided patient with direct contact information for nurse care coordinator Sent in basket message to Dr. Susann Givens and Kristian Covey PA regarding need for a dm f/u with patient and Rx for new glucometer Lab Results  Component Value Date   HGBA1C 6.7 (H) 09/13/2022     Interventions Today    Flowsheet Row Most Recent Value  Chronic Disease   Chronic disease during today's visit Diabetes, Other  [s/p bacterial endocarditis]  General Interventions   General Interventions Discussed/Reviewed General Interventions Discussed, General Interventions Reviewed, Labs, Doctor Visits, Durable Medical Equipment (DME), Communication with  Doctor Visits Discussed/Reviewed Doctor Visits Discussed, Doctor Visits Reviewed, PCP, Specialist  Durable Medical Equipment (DME) Glucomoter  Communication with PCP/Specialists  [Dr. Susann Givens, Kristian Covey PA]  Exercise Interventions   Exercise Discussed/Reviewed Exercise Reviewed, Exercise Discussed, Physical Activity  Physical Activity Discussed/Reviewed Physical Activity Reviewed, Physical Activity Discussed, PREP  Education Interventions   Education Provided Provided Education, Provided Printed Education  Provided Verbal Education On Nutrition, Eye Care, Foot Care, Labs, Blood Sugar Monitoring, Medication, Exercise, When to see the doctor  Labs Reviewed Hgb A1c  Nutrition Interventions   Nutrition Discussed/Reviewed Nutrition Discussed, Nutrition Reviewed, Carbohydrate meal planning, Portion sizes  Pharmacy Interventions   Pharmacy Dicussed/Reviewed Pharmacy Topics Discussed, Pharmacy Topics Reviewed, Medications and their functions          SDOH assessments and interventions completed:  No     Care Coordination Interventions:  Yes, provided   Follow up plan: Follow up call scheduled for 01/11/23 @11 :00 AM  Encounter Outcome:  Patient Visit Completed

## 2022-11-15 ENCOUNTER — Ambulatory Visit (INDEPENDENT_AMBULATORY_CARE_PROVIDER_SITE_OTHER): Payer: Medicare HMO

## 2022-11-15 DIAGNOSIS — I442 Atrioventricular block, complete: Secondary | ICD-10-CM | POA: Diagnosis not present

## 2022-11-15 LAB — CUP PACEART REMOTE DEVICE CHECK
Battery Remaining Longevity: 81 mo
Battery Voltage: 2.97 V
Brady Statistic AP VP Percent: 1.42 %
Brady Statistic AP VS Percent: 0.01 %
Brady Statistic AS VP Percent: 90.32 %
Brady Statistic AS VS Percent: 8.25 %
Brady Statistic RA Percent Paced: 2.14 %
Brady Statistic RV Percent Paced: 91.74 %
Date Time Interrogation Session: 20241006220143
Implantable Lead Connection Status: 753985
Implantable Lead Connection Status: 753985
Implantable Lead Implant Date: 20200908
Implantable Lead Implant Date: 20200908
Implantable Lead Location: 753859
Implantable Lead Location: 753860
Implantable Lead Model: 3830
Implantable Lead Model: 5076
Implantable Pulse Generator Implant Date: 20200908
Lead Channel Impedance Value: 266 Ohm
Lead Channel Impedance Value: 304 Ohm
Lead Channel Impedance Value: 342 Ohm
Lead Channel Impedance Value: 475 Ohm
Lead Channel Pacing Threshold Amplitude: 0.625 V
Lead Channel Pacing Threshold Amplitude: 1.125 V
Lead Channel Pacing Threshold Pulse Width: 0.4 ms
Lead Channel Pacing Threshold Pulse Width: 0.4 ms
Lead Channel Sensing Intrinsic Amplitude: 1.5 mV
Lead Channel Sensing Intrinsic Amplitude: 1.5 mV
Lead Channel Sensing Intrinsic Amplitude: 7.125 mV
Lead Channel Sensing Intrinsic Amplitude: 7.125 mV
Lead Channel Setting Pacing Amplitude: 1.5 V
Lead Channel Setting Pacing Amplitude: 2.5 V
Lead Channel Setting Pacing Pulse Width: 0.8 ms
Lead Channel Setting Sensing Sensitivity: 1.2 mV
Zone Setting Status: 755011
Zone Setting Status: 755011

## 2022-11-18 DIAGNOSIS — H52209 Unspecified astigmatism, unspecified eye: Secondary | ICD-10-CM | POA: Diagnosis not present

## 2022-11-18 DIAGNOSIS — H5203 Hypermetropia, bilateral: Secondary | ICD-10-CM | POA: Diagnosis not present

## 2022-11-18 DIAGNOSIS — H524 Presbyopia: Secondary | ICD-10-CM | POA: Diagnosis not present

## 2022-11-18 NOTE — Progress Notes (Signed)
ADVANCED HEART FAILURE CLINIC NOTE  Referring Physician: [name], MD  Primary Care: [name], MD   HPI: [name] is a 72 y.o. male with coronary artery disease status post CABG x 2 with left atrial appendage clipping and PFO closure, severe aortic stenosis and mitral stenosis status post bioprosthetic AVR and MVR, bioprosthetic mitral valve endocarditis (currently on IV antibiotics), hyperlipidemia, complete heart block status post permanent pacemaker, history of atrial flutter currently presenting today for follow up.. Based on chart review prior to his bioprosthetic AVR and MVR Mr. Cupps had preserved LV and RV function.  Shortly after the procedure he had a pacemaker placed for complete heart block.  Since that time echocardiograms demonstrate enlarging right ventricle with progressively worsening tricuspid regurgitation.    In May 2024, he presented with left upper extremity ischemia with acute occlusion of the left proximal brachial artery status post thromboembolectomy by vascular surgery.  During that time he had a TEE which demonstrated severe bioprosthetic mitral valve endocarditis; large vegetation on mitral valve leaflets.  Blood cultures were positive for Enterococcus faecalis.  He was diuresed with IV Lasix and discharged home on ampicillin and Rocephin for 6 weeks (starting 06/18/2022).    Interval hx:  Continuing to do very well. He is walking further distances without dyspnea or chest pressure. He wishes to go back and start exercising at the gym now.   Activity level/exercise tolerance: NYHA IIB Orthopnea:  Sleeps on 2 pillows Paroxysmal noctural dyspnea:  no Chest pain/pressure:  no Orthostatic lightheadedness:  no Palpitations:  no Lower extremity edema:  yes but resolved Presyncope/syncope:  no Cough:  no  Past Medical History:  Diagnosis Date   BCE (basal cell epithelioma), arm    RIGHT SHOULDER   Bradycardia    Bumps on skin    on left side  of nose for last 3 weeks   CAD (coronary artery disease)    a. s/p CABGx2 10/11/18 LIMA to LAD, SVG to RCA, EVH via right thigh.   Dyslipidemia    Ectatic thoracic aorta (HCC)    Esophageal stricture    GERD (gastroesophageal reflux disease)    Heart murmur    sees dr Rose Fillers   Hyperlipidemia    Incidental pulmonary nodule, > 3mm and < 8mm 10/04/2018   Noted on CTA   Inguinal hernia    bilateral   Obesity    Presence of permanent cardiac pacemaker    Prostate cancer (HCC)    S/P aortic valve replacement with bioprosthetic valve 10/11/2018   23 mm Edwards Inspiris Resilia stented bovine pericardial tissue valve, miltral valve done also   S/P CABG x 2 10/11/2018   LIMA to LAD, SVG to RCA, EVH via right thigh   S/P mitral valve replacement with bioprosthetic valve 10/11/2018   27 mm Medtronic Mosaic stented porcine bioprosthetic tissue valve   S/P patent foramen ovale closure 10/11/2018   S/P placement of cardiac pacemaker    Smoker     Current Outpatient Medications  Medication Sig Dispense Refill   amoxicillin (AMOXIL) 500 MG tablet Take 500 mg by mouth 3 (three) times daily.     apixaban (ELIQUIS) 5 MG TABS tablet TAKE 1 TABLET BY MOUTH TWICE DAILY 60 tablet 11   atorvastatin (LIPITOR) 20 MG tablet Take 1 tablet (20 mg total) by mouth daily. 90 tablet 3   clotrimazole (LOTRIMIN) 1 % cream Apply 1 Application topically 2 (two) times daily. 30 g 0   digoxin (  LANOXIN) 0.125 MG tablet Take 1 tablet (0.125 mg total) by mouth daily. 90 tablet 3   empagliflozin (JARDIANCE) 10 MG TABS tablet Take 1 tablet (10 mg total) by mouth daily before breakfast. 30 tablet 11   ferrous sulfate 325 (65 FE) MG tablet Take 325 mg by mouth daily with breakfast.     furosemide (LASIX) 20 MG tablet Take 1 tablet (20 mg total) by mouth daily. 30 tablet 11   metoprolol succinate (TOPROL-XL) 100 MG 24 hr tablet Take 1 tablet (100 mg total) by mouth daily. Take with or immediately following a meal. 90 tablet 3    oxymetazoline (AFRIN) 0.05 % nasal spray Place 1 spray into both nostrils at bedtime as needed for congestion.     polyethylene glycol powder (GLYCOLAX/MIRALAX) 17 GM/SCOOP powder Take 17 g by mouth daily as needed for mild constipation. 238 g 0   spironolactone (ALDACTONE) 25 MG tablet Take 1 tablet (25 mg total) by mouth daily. 90 tablet 3   acetaminophen (TYLENOL) 325 MG tablet Take 2 tablets (650 mg total) by mouth every 4 (four) hours as needed for moderate pain.     amoxicillin (AMOXIL) 500 MG capsule Take 1 capsule (500 mg total) by mouth 3 (three) times daily. 90 capsule 5   losartan (COZAAR) 25 MG tablet Take 0.5 tablets (12.5 mg total) by mouth at bedtime. (Patient not taking: Reported on 11/19/2022) 90 tablet 3   No current facility-administered medications for this encounter.    No Known Allergies    Social History   Socioeconomic History   Marital status: Married    Spouse name: Not on file   Number of children: 0   Years of education: Not on file   Highest education level: Not on file  Occupational History   Occupation: Environmental manager    Employer: [employer]  Tobacco Use   Smoking status: Former    Average packs/day: 0.3 packs/day for 40.6 years (14.0 ttl pk-yrs)    Types: Cigarettes    Start date: 02/08/1978    Passive exposure: Never   Smokeless tobacco: Never  Vaping Use   Vaping status: Never Used  Substance and Sexual Activity   Alcohol use: Yes    Alcohol/week: 1.0 standard drink of alcohol    Types: 1 Cans of beer per week    Comment: rare   Drug use: No   Sexual activity: Yes  Other Topics Concern   Not on file  Social History Narrative   Not on file   Social Determinants of Health   Financial Resource Strain: Low Risk  (05/11/2022)   Overall Financial Resource Strain (CARDIA)    Difficulty of Paying Living Expenses: Not hard at all  Food Insecurity: No Food Insecurity (07/15/2022)   Hunger Vital Sign    Worried About Running Out of Food in the Last  Year: Never true    Ran Out of Food in the Last Year: Never true  Transportation Needs: No Transportation Needs (06/24/2022)   PRAPARE - Administrator, Civil Service (Medical): No    Lack of Transportation (Non-Medical): No  Physical Activity: Inactive (05/11/2022)   Exercise Vital Sign    Days of Exercise per Week: 0 days    Minutes of Exercise per Session: 0 min  Stress: No Stress Concern Present (05/11/2022)   [employer] of Occupational Health - Occupational Stress Questionnaire    Feeling of Stress : Not at all  Social Connections: Not on file  Intimate Partner Violence:  Not At Risk (06/16/2022)   Humiliation, Afraid, Rape, and Kick questionnaire    Fear of Current or Ex-Partner: No    Emotionally Abused: No    Physically Abused: No    Sexually Abused: No      Family History  Problem Relation Age of Onset   Colon cancer Mother    Lung cancer Father        smoker   CAD Neg Hx    Pancreatic cancer Neg Hx    Breast cancer Neg Hx     PHYSICAL EXAM: Vitals:   11/19/22 1047  BP: 136/86  Pulse: 72  SpO2: 96%   GENERAL: Well nourished, well developed, and in no apparent distress at rest.  HEENT: Negative for arcus senilis or xanthelasma. There is no scleral icterus.  The mucous membranes are pink and moist.   NECK: Supple, No masses. Normal carotid upstrokes without bruits. No masses or thyromegaly.    CHEST: There are no chest wall deformities. There is no chest wall tenderness. Respirations are unlabored.  Lungs- CTA B/L CARDIAC:  JVP: 7 cm          Normal rate with regular rhythm. 3/6 SM.  Pulses are 2+ and symmetrical in upper and lower extremities. No edema.  ABDOMEN: Soft, non-tender, non-distended. There are no masses or hepatomegaly. There are normal bowel sounds.  EXTREMITIES: Warm and well perfused with no cyanosis, clubbing.  LYMPHATIC: No axillary or supraclavicular lymphadenopathy.  NEUROLOGIC: Patient is oriented x3 with no focal or lateralizing  neurologic deficits.  PSYCH: Patients affect is appropriate, there is no evidence of anxiety or depression.  SKIN: Warm and dry; rash on legs and arms; ?purpura?     DATA REVIEW  ECG: 06/03/22: NSR  as per my personal interpretation  ECHO: 07/06/22: LVEF 50-55%, large vegetation on bioprosthetic mitral valve; dilated RV.  06/16/22 (TEE): LVEF 50-55%, 4+ TR, 2x2cm vegetation on bioprosthetic mitral valve.  05/23/22: Normal LV function, severely dilated RV with moderately reduced function, moderate stenosis of prosthesis (gradient ), severe TR due to poor coaptation of leaflets, s/p AVR as per my personal interpretation 06/13/19: Normal LV function with septal bowing to the left; dilated RV with mildly reduced function. Mitral bioprosthesis gradient of 06/27/18: Normal LV function, normal RV function. Severe MS/AS with moderate AI.    CATH: 05/25/22:   Prox LAD lesion is 40% stenosed.   Mid LAD lesion is 95% stenosed.   Prox Cx to Mid Cx lesion is 30% stenosed.   1st Mrg lesion is 30% stenosed.   3rd Mrg lesion is 30% stenosed.   Prox RCA lesion is 100% stenosed.   SVG graft was visualized by angiography and is normal in caliber.   LIMA graft was visualized by angiography and is normal in caliber.   Severe mid LAD stenosis. Competitive filling is seen distally from the patent LIMA graft.  Patent LIMA graft to the mid LAD.  Mild non-obstructive disease in the Circumflex artery.  Large dominant RCA with chronic proximal occlusion. The mid and distal RCA fills from the patent vein graft.  Near normal right and left heart pressures.  CO 6.92 L/min CI 3.33  09/15/2018: Prox RCA lesion is 100% stenosed. Prox Cx to Mid Cx lesion is 30% stenosed. 1st Mrg lesion is 30% stenosed. 3rd Mrg lesion is 30% stenosed. Prox LAD lesion is 40% stenosed. Mid LAD lesion is 95% stenosed.   1. Severe stenosis mid LAD 2. Mild non-obstructive disease in the LAD 3.  Chronic occlusion of the  proximal RCA. Filling of the proximal, mid and distal RCA from left to right collaterals 4. Severe aortic stenosis (mean gradient 34.1 mmHg, peak to peak gradient 32 mmHg, AVA 1.1 cm2) 5. Severe mitral stenosis by echo.     ASSESSMENT & PLAN:   1. RV dysfunction - RHC hemodynamics from 05/25/22 personally interpretted:  RA 10 (large V waves), RV 28/6-10, PA 25/15, PCWP 14,  -RV dysfunction is now likely multifactorial: he is chronically RV paced with device interrogation from 05/15/22 demonstrating 96% RV pacing, he has a dilated RV leading to poor coaptation of the tricuspid valve and as a result severe TR and in addition has likely moderate to severe stenosis of his bioprosthetic mitral valve. From a hemodynamic standpoint though the MS does not appear to be having a significant effect on his PA pressures or PCWP.  - Prior to MVR/AVR, his LV function and RV function were intact. Since that time, serial echocardiograms demonstrate worsening TR and RV dilation. Although he has moderate to severe bioprosthetic MS it has not impacted his PA pressures or PCWP enough to compromise RV function to this point. RV dilation may be secondary to chronic RV pacing leading to TR due to poor valve coaptation.  - Euvolemic on exam, continue lasix 20mg  daily.  - In the setting of recently diagnosed endocarditis he is high risk for any procedures; doing very well functionally. Will continue to medically manage.  - continue spironolactone 25mg  daily - Diffuse patchy rash started at the same time as losartan; currently holding. Will hold off until he sees dermatology next week. Would like to get him on Entresto.  -Increase to toprol 100mg  daily.  - continue digoxin , continue jardiance 10mg . Repeat digoxin level.   2. Bioprosthetic mitral valve endocarditis & stenosis - TEE on 06/16/22 with severe bioprosthetic mitral valve endocarditis - Repeat TTE on 5/28 with persistent vegetation.  - Repeat TTE today w/  mild to moderately reduced RV function; mitral valve stenosis remains severe with thickening of the mitral valve.  - Completed 6 weeks of IV antibiotics. PICC line removed.  - Repeat TTE with thickening of mitral valve; seen by ID with repeat cultures no growth. Will continue to monitor.  - continue toprol 100mg  daily; no fevers/chills or signs of systemic infection.   3. Severe TR - Appears functional and secondary to RV dilation leading to poor coaptation. See above.   4. CAD s/p CABG x 2, closure of PFO - AVR, MVR and CABG x 2 (LIMA-LAD, SVG to RCA) in 10/11/2018 - Increase to toprol 100mg  daily - Reports no chest pain.   5. pAFL - continue metoprolol - apixaban 5mg  BID - No AFL on device interrogation.   6. Left brachial artery occlusion  - due to mitral valve endocarditis - adequate pulses b/l today - No arm pain; normal pulses  7. Healthcare maintenance - A2V of 6.7 at recent appt. Will follow with PCP now.   8. Medtronic ICD - No AT/AF; no VT - interrogated device today in clinic.   [facility] Mechanical Circulatory Support

## 2022-11-19 ENCOUNTER — Encounter (HOSPITAL_COMMUNITY): Payer: Self-pay | Admitting: Cardiology

## 2022-11-19 ENCOUNTER — Ambulatory Visit (HOSPITAL_COMMUNITY)
Admission: RE | Admit: 2022-11-19 | Discharge: 2022-11-19 | Disposition: A | Discharge status: Home / Self Care | Payer: Medicare HMO | Source: Ambulatory Visit | Attending: Cardiology | Admitting: Cardiology

## 2022-11-19 VITALS — BP 136/86 | HR 72 | Wt 203.2 lb

## 2022-11-19 DIAGNOSIS — I330 Acute and subacute infective endocarditis: Secondary | ICD-10-CM

## 2022-11-19 DIAGNOSIS — Z79899 Other long term (current) drug therapy: Secondary | ICD-10-CM | POA: Diagnosis not present

## 2022-11-19 DIAGNOSIS — Z8774 Personal history of (corrected) congenital malformations of heart and circulatory system: Secondary | ICD-10-CM | POA: Diagnosis not present

## 2022-11-19 DIAGNOSIS — Z7901 Long term (current) use of anticoagulants: Secondary | ICD-10-CM | POA: Insufficient documentation

## 2022-11-19 DIAGNOSIS — Z951 Presence of aortocoronary bypass graft: Secondary | ICD-10-CM | POA: Diagnosis not present

## 2022-11-19 DIAGNOSIS — I442 Atrioventricular block, complete: Secondary | ICD-10-CM | POA: Insufficient documentation

## 2022-11-19 DIAGNOSIS — Z953 Presence of xenogenic heart valve: Secondary | ICD-10-CM | POA: Diagnosis not present

## 2022-11-19 DIAGNOSIS — I5032 Chronic diastolic (congestive) heart failure: Secondary | ICD-10-CM | POA: Insufficient documentation

## 2022-11-19 DIAGNOSIS — I50810 Right heart failure, unspecified: Secondary | ICD-10-CM

## 2022-11-19 DIAGNOSIS — I4892 Unspecified atrial flutter: Secondary | ICD-10-CM | POA: Diagnosis not present

## 2022-11-19 DIAGNOSIS — I071 Rheumatic tricuspid insufficiency: Secondary | ICD-10-CM | POA: Diagnosis not present

## 2022-11-19 DIAGNOSIS — B952 Enterococcus as the cause of diseases classified elsewhere: Secondary | ICD-10-CM | POA: Diagnosis not present

## 2022-11-19 DIAGNOSIS — E785 Hyperlipidemia, unspecified: Secondary | ICD-10-CM | POA: Diagnosis not present

## 2022-11-19 DIAGNOSIS — I70208 Unspecified atherosclerosis of native arteries of extremities, other extremity: Secondary | ICD-10-CM | POA: Diagnosis not present

## 2022-11-19 DIAGNOSIS — I083 Combined rheumatic disorders of mitral, aortic and tricuspid valves: Secondary | ICD-10-CM | POA: Insufficient documentation

## 2022-11-19 DIAGNOSIS — R21 Rash and other nonspecific skin eruption: Secondary | ICD-10-CM | POA: Diagnosis not present

## 2022-11-19 DIAGNOSIS — I2510 Atherosclerotic heart disease of native coronary artery without angina pectoris: Secondary | ICD-10-CM | POA: Diagnosis not present

## 2022-11-19 DIAGNOSIS — Z950 Presence of cardiac pacemaker: Secondary | ICD-10-CM | POA: Insufficient documentation

## 2022-11-19 LAB — BASIC METABOLIC PANEL
Anion gap: 12 (ref 5–15)
BUN: 21 mg/dL (ref 8–23)
CO2: 22 mmol/L (ref 22–32)
Calcium: 9.6 mg/dL (ref 8.9–10.3)
Chloride: 101 mmol/L (ref 98–111)
Creatinine, Ser: 1.15 mg/dL (ref 0.61–1.24)
GFR, Estimated: >60 mL/min (ref >60)
Glucose, Bld: 129 mg/dL — ABNORMAL HIGH (ref 70–99)
Potassium: 4.2 mmol/L (ref 3.5–5.1)
Sodium: 135 mmol/L (ref 135–145)

## 2022-11-19 LAB — LIPID PANEL
Cholesterol: 100 mg/dL (ref 0–200)
HDL: 35 mg/dL — ABNORMAL LOW (ref >40)
LDL Cholesterol: 50 mg/dL (ref 0–99)
Total CHOL/HDL Ratio: 2.9 {ratio}
Triglycerides: 74 mg/dL (ref <150)
VLDL: 15 mg/dL (ref 0–40)

## 2022-11-19 LAB — DIGOXIN LEVEL: Digoxin Level: 0.4 ng/mL — ABNORMAL LOW (ref 0.8–2.0)

## 2022-11-19 LAB — BRAIN NATRIURETIC PEPTIDE: B Natriuretic Peptide: 195 pg/mL — ABNORMAL HIGH (ref 0.0–100.0)

## 2022-11-19 MED ORDER — METOPROLOL SUCCINATE ER 100 MG PO TB24: 100.0000 mg | ORAL_TABLET | Freq: Every day | ORAL | 3 refills | Status: DC

## 2022-11-19 NOTE — Patient Instructions (Addendum)
Medication Changes:  STOP METOPROLOL TARTRATE   START: METOPROLOL SUCCINATE 100MG  ONCE  DAILY   HOLD LOSARTAN (DO NOT TAKE) TO SEE HOW YOUR RASH DOES   Lab Work:  Labs done today, your results will be available in MyChart, we will contact you for abnormal readings.  Follow-Up in: 2 MONTHS AS SCHEDULED WITH DR. Gasper Lloyd WITH ECHO  At the [facility], you and your health needs are our priority. We have a designated team specialized in the treatment of Heart Failure. This Care Team includes your primary Heart Failure Specialized Cardiologist (physician), Advanced Practice Providers (APPs- Physician Assistants and Nurse Practitioners), and Pharmacist who all work together to provide you with the care you need, when you need it.   You may see any of the following providers on your designated Care Team at your next follow up:  Dr. Arvilla Meres Dr. Marca Ancona Dr. Dorthula Nettles Dr. Theresia Bough Tonye Becket, NP Robbie Lis, [place] [facility] [place], [place] Brynda Peon, NP [place] Lee, NP Karle Plumber, PharmD   Please be sure to bring in all your medications bottles to every appointment.   Need to Contact us:  If you have any questions or concerns before your next appointment please send [place] a message through [place] or call our office at [phone].    TO LEAVE A MESSAGE FOR THE NURSE SELECT OPTION 2, PLEASE LEAVE A MESSAGE INCLUDING: YOUR NAME DATE OF BIRTH CALL BACK NUMBER REASON FOR CALL**this is important as we prioritize the call backs  YOU WILL RECEIVE A CALL BACK THE SAME DAY AS LONG AS YOU CALL BEFORE 4:00 PM

## 2022-11-22 NOTE — Progress Notes (Signed)
Erroneous encounter. Patient followed by HF clinic. This encounter was created in error - please disregard.

## 2022-11-23 ENCOUNTER — Ambulatory Visit (INDEPENDENT_AMBULATORY_CARE_PROVIDER_SITE_OTHER): Payer: Medicare HMO | Admitting: Family Medicine

## 2022-11-23 ENCOUNTER — Encounter: Payer: Self-pay | Admitting: Family Medicine

## 2022-11-23 ENCOUNTER — Encounter: Payer: Self-pay | Admitting: Internal Medicine

## 2022-11-23 ENCOUNTER — Other Ambulatory Visit: Payer: Self-pay

## 2022-11-23 ENCOUNTER — Ambulatory Visit: Payer: Medicare HMO | Admitting: Internal Medicine

## 2022-11-23 VITALS — BP 114/77 | HR 72 | Resp 16 | Ht 67.0 in | Wt 205.2 lb

## 2022-11-23 VITALS — BP 122/68 | HR 89 | Ht 67.0 in | Wt 207.4 lb

## 2022-11-23 DIAGNOSIS — E119 Type 2 diabetes mellitus without complications: Secondary | ICD-10-CM

## 2022-11-23 DIAGNOSIS — I330 Acute and subacute infective endocarditis: Secondary | ICD-10-CM

## 2022-11-23 DIAGNOSIS — D692 Other nonthrombocytopenic purpura: Secondary | ICD-10-CM

## 2022-11-23 DIAGNOSIS — L309 Dermatitis, unspecified: Secondary | ICD-10-CM | POA: Diagnosis not present

## 2022-11-23 DIAGNOSIS — I214 Non-ST elevation (NSTEMI) myocardial infarction: Secondary | ICD-10-CM

## 2022-11-23 DIAGNOSIS — E669 Obesity, unspecified: Secondary | ICD-10-CM

## 2022-11-23 DIAGNOSIS — I442 Atrioventricular block, complete: Secondary | ICD-10-CM

## 2022-11-23 DIAGNOSIS — Z952 Presence of prosthetic heart valve: Secondary | ICD-10-CM | POA: Diagnosis not present

## 2022-11-23 DIAGNOSIS — Z23 Encounter for immunization: Secondary | ICD-10-CM

## 2022-11-23 DIAGNOSIS — C61 Malignant neoplasm of prostate: Secondary | ICD-10-CM

## 2022-11-23 NOTE — Progress Notes (Signed)
Subjective:    Patient ID: [name], male    DOB: 07-17-1950, 72 y.o.   MRN: [id number]  HPI He is here for follow-up visit.  He is currently being treated for acute bacterial endocarditis and is on Amoxil.  He was seen earlier today by infectious disease.  There is also concerned about a rash that he has and photos were taken and put in the record.  He does have an appointment to see a dermatologist in [place] but eventually wants to switch to dermatologist in the [place] area.  Also on August 5 blood work was done which did show a hemoglobin A1c of 6.7.  He had been on Jardiance since April for treatment of his cardiac condition.  Apparently no one explained exactly what the A1c really met.  He is here today so we can discuss this in more detail.   Review of Systems     Objective:    Physical Exam Alert and in no distress.  Grade 1/6 to 2/6 systolic murmur was appreciated.  Exam of the skin does show multiple erythematous excoriated lesions on his arms and also some purpuric lesions on his forearms.  He also has lesions on his legs.       Assessment & Plan:   Problem List Items Addressed This Visit     Complete heart block (HCC)   Endocarditis   History of mitral valve replacement   Obesity (BMI 30-39.9)   Senile purpura (HCC)   Other Visit Diagnoses     New onset type 2 diabetes mellitus (HCC)    -  Primary   Need for influenza vaccination       Relevant Orders   Flu Vaccine Trivalent High Dose (Fluad) (Completed)   Dermatitis         I explained the diagnosis of diabetes in regard to inability to maintain a blood sugar within the range.  Explained that he has been on Jardiance which is cardiac protective as well as the treatment of diabetes.  He is on blood pressure medications as well as a statin which is again the standard or taking care of diabetes.  Recommend he go to the American diabetes Association website to learn more about diabetes.  I explained that  the main issue right now is his cardiac status in regard to general cardiac health as well as the endocarditis.  Encouraged him to have the dermatologist send me the report so we can document this in the record and we can eventually switch to a dermatologist in our system which would make taking care of him much easier.  He is in agreement with this. Recheck here in 2 months.

## 2022-11-23 NOTE — Patient Instructions (Signed)
Go to the American diabetes Association website and look up diabetes in general and also dietary modification which revolves around mainly cutting back on carbs.   The main thing with physical activity is just getting more physical based on your ability.  Eventually I will want you up to 20 minutes daily of something physical

## 2022-11-23 NOTE — Progress Notes (Signed)
Patient: [name]  DOB: 08/22/1950 MRN: [id number] PCP: [name], MD      Patient Active Problem List   Diagnosis Date Noted   Chronic diastolic heart failure (HCC) 06/18/2022   Endocarditis 06/17/2022   Bacteremia due to Enterococcus 06/17/2022   Acute CVA (cerebrovascular accident) (HCC) 06/17/2022   Ischemia of left upper extremity 06/15/2022   Arterial occlusion 06/15/2022   Brachial artery occlusion, left (HCC) 06/15/2022   Paroxysmal A-fib (HCC) 06/15/2022   Secondary hypercoagulable state (HCC) 06/15/2022   History of mitral valve repair 06/15/2022   Demand ischemia (HCC) 05/25/2022   NSTEMI (non-ST elevated myocardial infarction) (HCC) 05/23/2022   Senile purpura (HCC) 05/18/2022   Atrial flutter (HCC) 09/02/2021   Atherosclerosis of aorta (HCC) 04/30/2019   Actinic keratosis 04/30/2019   Malignant neoplasm of prostate (HCC) 02/06/2019   Complete heart block (HCC) 01/18/2019   Pacemaker 01/18/2019   Encounter for therapeutic drug monitoring 10/23/2018   H/O aortic valve replacement 10/11/2018   S/P CABG x 2 10/11/2018   History of mitral valve replacement 10/11/2018   Incidental pulmonary nodule, > 3mm and < 8mm 10/04/2018   Coronary artery disease involving native coronary artery of native heart without angina pectoris    Mixed hyperlipidemia 06/15/2017   Hypogonadism in male 11/05/2015   Former smoker 12/27/2011   Obesity (BMI 30-39.9) 12/27/2011   History of esophageal stricture 12/27/2011     Subjective:  [name] is a 72 y.o. with PMHx as below and with bioprosthetic AVR and by bioprosthetic MVR admitted with acute thrombus to left lower extremity requiring urgent surgery presents for follow-up on E faecalis bacteremia . Discharged on IV abc x 6 weeks.    07/12/22: Doing well tolerating abx. Deny fevers. States takin yogurt with abx.  08/19/22: tolerating abx.  Today  11/23/22. Pt noted a pruiritc rash aobut a month ago that comes and  goes all over his body. Coninues to take amox. .  Review of Systems  All other systems reviewed and are negative.   Past Medical History:  Diagnosis Date   BCE (basal cell epithelioma), arm    RIGHT SHOULDER   Bradycardia    Bumps on skin    on left side of nose for last 3 weeks   CAD (coronary artery disease)    a. s/p CABGx2 10/11/18 LIMA to LAD, SVG to RCA, EVH via right thigh.   Dyslipidemia    Ectatic thoracic aorta (HCC)    Esophageal stricture    GERD (gastroesophageal reflux disease)    Heart murmur    sees dr Rose Fillers   Hyperlipidemia    Incidental pulmonary nodule, > 3mm and < 8mm 10/04/2018   Noted on CTA   Inguinal hernia    bilateral   Obesity    Presence of permanent cardiac pacemaker    Prostate cancer (HCC)    S/P aortic valve replacement with bioprosthetic valve 10/11/2018   23 mm Edwards Inspiris Resilia stented bovine pericardial tissue valve, miltral valve done also   S/P CABG x 2 10/11/2018   LIMA to LAD, SVG to RCA, EVH via right thigh   S/P mitral valve replacement with bioprosthetic valve 10/11/2018   27 mm Medtronic Mosaic stented porcine bioprosthetic tissue valve   S/P patent foramen ovale closure 10/11/2018   S/P placement of cardiac pacemaker    Smoker     Outpatient Medications Prior to Visit  Medication Sig Dispense Refill   acetaminophen (TYLENOL) 325  MG tablet Take 2 tablets (650 mg total) by mouth every 4 (four) hours as needed for moderate pain.     amoxicillin (AMOXIL) 500 MG capsule Take 1 capsule (500 mg total) by mouth 3 (three) times daily. 90 capsule 5   amoxicillin (AMOXIL) 500 MG tablet Take 500 mg by mouth 3 (three) times daily.     apixaban (ELIQUIS) 5 MG TABS tablet TAKE 1 TABLET BY MOUTH TWICE DAILY 60 tablet 11   atorvastatin (LIPITOR) 20 MG tablet Take 1 tablet (20 mg total) by mouth daily. 90 tablet 3   clotrimazole (LOTRIMIN) 1 % cream Apply 1 Application topically 2 (two) times daily. 30 g 0   digoxin (LANOXIN) 0.125 MG  tablet Take 1 tablet (0.125 mg total) by mouth daily. 90 tablet 3   empagliflozin (JARDIANCE) 10 MG TABS tablet Take 1 tablet (10 mg total) by mouth daily before breakfast. 30 tablet 11   ferrous sulfate 325 (65 FE) MG tablet Take 325 mg by mouth daily with breakfast.     furosemide (LASIX) 20 MG tablet Take 1 tablet (20 mg total) by mouth daily. 30 tablet 11   losartan (COZAAR) 25 MG tablet Take 0.5 tablets (12.5 mg total) by mouth at bedtime. (Patient not taking: Reported on 11/19/2022) 90 tablet 3   metoprolol succinate (TOPROL-XL) 100 MG 24 hr tablet Take 1 tablet (100 mg total) by mouth daily. Take with or immediately following a meal. 90 tablet 3   oxymetazoline (AFRIN) 0.05 % nasal spray Place 1 spray into both nostrils at bedtime as needed for congestion.     polyethylene glycol powder (GLYCOLAX/MIRALAX) 17 GM/SCOOP powder Take 17 g by mouth daily as needed for mild constipation. 238 g 0   spironolactone (ALDACTONE) 25 MG tablet Take 1 tablet (25 mg total) by mouth daily. 90 tablet 3   No facility-administered medications prior to visit.     No Known Allergies  Social History   Tobacco Use   Smoking status: Former    Average packs/day: 0.3 packs/day for 40.6 years (14.0 ttl pk-yrs)    Types: Cigarettes    Start date: 02/08/1978    Passive exposure: Never   Smokeless tobacco: Never  Vaping Use   Vaping status: Never Used  Substance Use Topics   Alcohol use: Yes    Alcohol/week: 1.0 standard drink of alcohol    Types: 1 Cans of beer per week    Comment: rare   Drug use: No    Family History  Problem Relation Age of Onset   Colon cancer Mother    Lung cancer Father        smoker   CAD Neg Hx    Pancreatic cancer Neg Hx    Breast cancer Neg Hx     Objective:  There were no vitals filed for this visit. There is no height or weight on file to calculate BMI.  Physical Exam Constitutional:      General: He is not in acute distress.    Appearance: He is normal weight.  He is not toxic-appearing.  HENT:     Head: Normocephalic and atraumatic.     Right Ear: External ear normal.     Left Ear: External ear normal.     Nose: No congestion or rhinorrhea.     Mouth/Throat:     Mouth: Mucous membranes are moist.     Pharynx: Oropharynx is clear.  Eyes:     Extraocular Movements: Extraocular movements intact.  Conjunctiva/sclera: Conjunctivae normal.     Pupils: Pupils are equal, round, and reactive to light.  Cardiovascular:     Rate and Rhythm: Normal rate and regular rhythm.     Heart sounds: No murmur heard.    No friction rub. No gallop.  Pulmonary:     Effort: Pulmonary effort is normal.     Breath sounds: Normal breath sounds.  Abdominal:     General: Abdomen is flat. Bowel sounds are normal.     Palpations: Abdomen is soft.  Musculoskeletal:        General: No swelling. Normal range of motion.     Cervical back: Normal range of motion and neck supple.  Skin:    General: Skin is warm and dry.  Neurological:     General: No focal deficit present.     Mental Status: He is oriented to person, place, and time.  Psychiatric:        Mood and Affect: Mood normal.        Lab Results: Lab Results  Component Value Date   WBC 8.6 08/19/2022   HGB 12.4 (L) 08/19/2022   HCT 40.3 08/19/2022   MCV 81.3 08/19/2022   PLT 214 08/19/2022    Lab Results  Component Value Date   CREATININE 1.15 11/19/2022   BUN 21 11/19/2022   NA 135 11/19/2022   K 4.2 11/19/2022   CL 101 11/19/2022   CO2 22 11/19/2022    Lab Results  Component Value Date   ALT 9 08/19/2022   AST 14 08/19/2022   ALKPHOS 74 06/15/2022   BILITOT 0.8 08/19/2022     Assessment & Plan:   #E faecalis bacteremia with bioprosthetic mitral valve and bioprosthetic aortic valve endocarditis complicated by embolization to left arm #CAD status post CABG #Aortic valve replacement bovine # Mitral valve replacement bioprosthetic #Complete heart block status post PPM -SP left  upper extremity thrombectomy via brachial radial artery and angiogram on 5/7 with Dr. Randie Heinz, vascular surgery. Vascular surgery following -TEE  showed large vegetation encompassing majority of mitral valve leaflet, aortic bovine valve with small subcentimeter vegetation. -Seen by CTS Dr. Cliffton Asters, noted that given all his comorbidities the likelihood of subsequent surgical transfer limited.  Recommended medical management and antibiotic therapy at this point. -Discharged on Ampicillin 12 gm q24h continous infusion and ctx 2 gm q12h  x 6 weeks->amox 1gm tid  started on 6/20 fo rpO suppression. I opted for higher dose suppresion given 2 bioprosthetic valce involvement -TTE on 5/28 showed redemonstration of mitral valve prosthetic vegetation and LV chordal redundancy versus likely vegetation.  TTE on 7/5 showed smaller mobile elements on prosthetic mitral valve, abnormal thickness seen on prosthetic aortic valve. Plan: -Surveillance blood Cx and labs on7/11 unremarkable -Labs today  -Continue amox 500 tid  #Rash -Pt states rash on arms, legs and back started about a month ago, rash is pruritic. Timing of rash co-encides with start of losartan, which he is no longer taking. He states the rash subsides (not competly) and returns. Plans ot see derm tomorrow. At this point will continue amox given the rash improves on its own(unlikely drug rash). OF note pt has had previous rashes for which he was seen by derm. So not sure if this is a continuance of same pathology as past rashes.  -Discussed if Biopsy done to let ID knpw -F/U in one month  Danelle Earthly, MD [facility]   11/23/22  10:51 AM  I have personally spent 42 minutes involved in face-to-face and non-face-to-face activities for this patient on the day of the visit. Professional time spent includes the following activities: Preparing to see the patient (review of tests), Obtaining and/or  reviewing separately obtained history (admission/discharge record), Performing a medically appropriate examination and/or evaluation , Ordering medications/tests/procedures, referring and communicating with other health care professionals, Documenting clinical information in the EMR, Independently interpreting results (not separately reported), Communicating results to the patient/family/caregiver, Counseling and educating the patient/family/caregiver and Care coordination (not separately reported).

## 2022-11-24 DIAGNOSIS — L309 Dermatitis, unspecified: Secondary | ICD-10-CM | POA: Diagnosis not present

## 2022-11-24 DIAGNOSIS — L299 Pruritus, unspecified: Secondary | ICD-10-CM | POA: Diagnosis not present

## 2022-11-24 DIAGNOSIS — L300 Nummular dermatitis: Secondary | ICD-10-CM | POA: Diagnosis not present

## 2022-11-24 DIAGNOSIS — D485 Neoplasm of uncertain behavior of skin: Secondary | ICD-10-CM | POA: Diagnosis not present

## 2022-11-24 LAB — COMPLETE METABOLIC PANEL WITH GFR
AG Ratio: 1.2 (calc) (ref 1.0–2.5)
ALT: 14 U/L (ref 9–46)
AST: 20 U/L (ref 10–35)
Albumin: 4.1 g/dL (ref 3.6–5.1)
Alkaline phosphatase (APISO): 76 U/L (ref 35–144)
BUN: 23 mg/dL (ref 7–25)
CO2: 27 mmol/L (ref 20–32)
Calcium: 9.7 mg/dL (ref 8.6–10.3)
Chloride: 101 mmol/L (ref 98–110)
Creat: 1.22 mg/dL (ref 0.70–1.28)
Globulin: 3.3 g/dL (ref 1.9–3.7)
Glucose, Bld: 133 mg/dL — ABNORMAL HIGH (ref 65–99)
Potassium: 4.4 mmol/L (ref 3.5–5.3)
Sodium: 138 mmol/L (ref 135–146)
Total Bilirubin: 1.6 mg/dL — ABNORMAL HIGH (ref 0.2–1.2)
Total Protein: 7.4 g/dL (ref 6.1–8.1)
eGFR: 63 mL/min/{1.73_m2} (ref >=60)

## 2022-11-24 LAB — CBC WITH DIFFERENTIAL/PLATELET
Absolute Lymphocytes: 1144 {cells}/uL (ref 850–3900)
Absolute Monocytes: 851 {cells}/uL (ref 200–950)
Basophils Absolute: 52 {cells}/uL (ref 0–200)
Basophils Relative: 0.6 %
Eosinophils Absolute: 301 {cells}/uL (ref 15–500)
Eosinophils Relative: 3.5 %
HCT: 43.6 % (ref 38.5–50.0)
Hemoglobin: 14.1 g/dL (ref 13.2–17.1)
MCH: 26.6 pg — ABNORMAL LOW (ref 27.0–33.0)
MCHC: 32.3 g/dL (ref 32.0–36.0)
MCV: 82.3 fL (ref 80.0–100.0)
MPV: 12.2 fL (ref 7.5–12.5)
Monocytes Relative: 9.9 %
Neutro Abs: 6252 {cells}/uL (ref 1500–7800)
Neutrophils Relative %: 72.7 %
Platelets: 185 10*3/uL (ref 140–400)
RBC: 5.3 10*6/uL (ref 4.20–5.80)
RDW: 18 % — ABNORMAL HIGH (ref 11.0–15.0)
Total Lymphocyte: 13.3 %
WBC: 8.6 10*3/uL (ref 3.8–10.8)

## 2022-11-29 NOTE — Progress Notes (Signed)
Remote pacemaker transmission.   

## 2022-12-20 ENCOUNTER — Ambulatory Visit: Payer: Medicare HMO | Admitting: Cardiology

## 2022-12-22 ENCOUNTER — Ambulatory Visit: Payer: Medicare HMO | Admitting: Internal Medicine

## 2023-01-03 NOTE — Progress Notes (Unsigned)
  Electrophysiology Office Note:   ID:  Michael, [name] 10-Nov-1950, MRN [id number]  Primary Cardiologist: Verne Carrow, MD Electrophysiologist: Lanier Prude, MD  {Click to update primary MD,subspecialty MD or APP then REFRESH:1}    History of Present Illness:   [name] is a 72 y.o. male with h/o CAD s/p cABG 2020, s/p bioprosthetic AVR and MVR, PFO closure and LAA clipping, CHB s/p PPM, and AFL seen today for routine electrophysiology followup.   Since last being seen in our clinic the patient reports doing ***.  he denies chest pain, palpitations, dyspnea, PND, orthopnea, nausea, vomiting, dizziness, syncope, edema, weight gain, or early satiety.   Review of systems complete and found to be negative unless listed in HPI.   EP Information / Studies Reviewed:    EKG is ordered today. Personal review as below.       PPM Interrogation-  reviewed in detail today,  See PACEART report.  Device History: MDT dual chamber PPM implanted 10/17/2018   Echo 08/13/2022 LVEF 50-55%, mild LVH, mod RV dysfunction, mod LAE, mod RAE  Physical Exam:   VS:  There were no vitals taken for this visit.   Wt Readings from Last 3 Encounters:  11/23/22 207 lb 6.4 oz (94.1 kg)  11/23/22 205 lb 3.2 oz (93.1 kg)  11/19/22 203 lb 3.2 oz (92.2 kg)     GEN: Well nourished, well developed in no acute distress NECK: No JVD; No carotid bruits CARDIAC: {EPRHYTHM:28826}, no murmurs, rubs, gallops RESPIRATORY:  Clear to auscultation without rales, wheezing or rhonchi  ABDOMEN: Soft, non-tender, non-distended EXTREMITIES:  No edema; No deformity   ASSESSMENT AND PLAN:    CHB s/p Medtronic PPM  Normal PPM function See Pace Art report No changes today  CAD s/p CABG Denies s/s ischemia  S/p bioprosthetic AVR S/p bioprosthetic MVR Stable recent echo, pending update  RV failure Following closely with Dr. Gasper Lloyd  Paroxysmal atrial flutter Continue eliquis 5 mg BID   {Click here  to Review PMH, Prob List, Meds, Allergies, SHx, FHx  :1}   Disposition:   Follow up with {EPPROVIDERS:28135} {EPFOLLOW UP:28173}  Signed, Graciella Freer, PA-C

## 2023-01-04 ENCOUNTER — Encounter: Payer: Self-pay | Admitting: Student

## 2023-01-04 ENCOUNTER — Ambulatory Visit: Payer: Medicare HMO | Attending: Student | Admitting: Student

## 2023-01-04 VITALS — BP 118/74 | HR 85 | Ht 67.0 in | Wt 215.4 lb

## 2023-01-04 DIAGNOSIS — I5032 Chronic diastolic (congestive) heart failure: Secondary | ICD-10-CM

## 2023-01-04 DIAGNOSIS — I442 Atrioventricular block, complete: Secondary | ICD-10-CM

## 2023-01-04 DIAGNOSIS — I4892 Unspecified atrial flutter: Secondary | ICD-10-CM | POA: Diagnosis not present

## 2023-01-04 DIAGNOSIS — Z951 Presence of aortocoronary bypass graft: Secondary | ICD-10-CM | POA: Diagnosis not present

## 2023-01-04 DIAGNOSIS — Z953 Presence of xenogenic heart valve: Secondary | ICD-10-CM

## 2023-01-04 DIAGNOSIS — I50810 Right heart failure, unspecified: Secondary | ICD-10-CM

## 2023-01-04 LAB — CUP PACEART INCLINIC DEVICE CHECK
Battery Remaining Longevity: 76 mo
Battery Voltage: 2.97 V
Brady Statistic AP VP Percent: 1.32 %
Brady Statistic AP VS Percent: 0 %
Brady Statistic AS VP Percent: 88.45 %
Brady Statistic AS VS Percent: 10.23 %
Brady Statistic RA Percent Paced: 2.03 %
Brady Statistic RV Percent Paced: 89.77 %
Date Time Interrogation Session: 20241126125446
Implantable Lead Connection Status: 753985
Implantable Lead Connection Status: 753985
Implantable Lead Implant Date: 20200908
Implantable Lead Implant Date: 20200908
Implantable Lead Location: 753859
Implantable Lead Location: 753860
Implantable Lead Model: 3830
Implantable Lead Model: 5076
Implantable Pulse Generator Implant Date: 20200908
Lead Channel Impedance Value: 304 Ohm
Lead Channel Impedance Value: 342 Ohm
Lead Channel Impedance Value: 380 Ohm
Lead Channel Impedance Value: 456 Ohm
Lead Channel Pacing Threshold Amplitude: 0.625 V
Lead Channel Pacing Threshold Amplitude: 0.875 V
Lead Channel Pacing Threshold Pulse Width: 0.4 ms
Lead Channel Pacing Threshold Pulse Width: 0.4 ms
Lead Channel Sensing Intrinsic Amplitude: 1.375 mV
Lead Channel Sensing Intrinsic Amplitude: 1.75 mV
Lead Channel Sensing Intrinsic Amplitude: 12.375 mV
Lead Channel Sensing Intrinsic Amplitude: 7.125 mV
Lead Channel Setting Pacing Amplitude: 1.5 V
Lead Channel Setting Pacing Amplitude: 2.5 V
Lead Channel Setting Pacing Pulse Width: 0.8 ms
Lead Channel Setting Sensing Sensitivity: 1.2 mV
Zone Setting Status: 755011
Zone Setting Status: 755011

## 2023-01-04 NOTE — Patient Instructions (Signed)
Medication Instructions:  Your physician recommends that you continue on your current medications as directed. Please refer to the Current Medication list given to you today.  *If you need a refill on your cardiac medications before your next appointment, please call your pharmacy*   Lab Work: None ordered If you have labs (blood work) drawn today and your tests are completely normal, you will receive your results only by: MyChart Message (if you have MyChart) OR A paper copy in the mail If you have any lab test that is abnormal or we need to change your treatment, we will call you to review the results   Follow-Up: At [facility], you and your health needs are our priority.  As part of our continuing mission to provide you with exceptional heart care, we have created designated Provider Care Teams.  These Care Teams include your primary Cardiologist (physician) and Advanced Practice Providers (APPs -  Physician Assistants and Nurse Practitioners) who all work together to provide you with the care you need, when you need it.  Your next appointment:   1 year(s)  Provider:   Cameron Lambert, MD  

## 2023-01-11 ENCOUNTER — Ambulatory Visit: Payer: Self-pay

## 2023-01-11 NOTE — Patient Outreach (Signed)
Care Coordination   Follow Up Visit Note   01/11/2023 Name: [name] MRN: [id number] DOB: 1950-04-23  [name] Seper is a 72 y.o. year old male who sees [name], MD for primary care. I spoke with  [name] by phone today.  What matters to the patients health and wellness today?  Patient would like to work on establishing a routine exercise regimen to help build stamina and endurance.     Goals Addressed             This Visit's Progress    RN Care Coordination Activities: further follow up needed   On track    Care Coordination Interventions: Evaluation of current treatment plan related to acute bacterial endocarditis and patient's adherence to plan as established by provider Determined patient is progressing towards wellness, as evidence by he is feeling stronger and states in the past week he has been more physically active  Reviewed and discussed recent Cardiology and ID follow up visits Review of patient status, including review of consultant's reports, relevant laboratory and other test results, and medications completed Reviewed and discussed with patient his upcoming scheduled Echocardiogram and Cardiology follow with Dr. Robynn Pane scheduled for 01/20/23 @8 :00 AM; next ID with Dr. Thedore Mins scheduled for 01/24/23 @09 :45 AM Patient will keep all scheduled follow up appointments Patient will continue to work on establishing a routine exercise regimen as tolerated, aiming for 150 minutes weekly Patient will work on increasing his daily water intake, aiming for 48-64 oz daily unless otherwise directed by his doctor  Patient will report new symptoms or concerns to his doctor promptly Patient will continue to work with nurse care coordinator for chronic disease management and care coordination needs     To start monitoring cbgs and lower A1c   On track    Care Coordination Interventions: Provided education to patient about basic DM disease process Discussed with  patient he completed a follow up with his PCP to discuss his diabetes, patient denies having questions at this time Confirmed patient received and reviewed the diabetes educational materials previously mailed, he denies having questions a this time  Counseled on Diabetic diet, my plate method, 332 minutes of moderate intensity exercise/week Discussed plans with patient for ongoing care coordination follow up and provided patient with direct contact information for nurse care coordinator Reviewed next scheduled in person visit with PCP for med management scheduled for 02/08/23 @11 :45 AM Patient will continue to adhere to a diabetic friendly diet using the plate method and portion control  Patient will work on establishing a routine exercise regimen as tolerated, aiming for 150 minutes weekly Patient will keep all scheduled PCP follow up appointments for dm check as directed Patient will review the American Diabetes website to learn more about resources and disease education  Patient will continue to work with nurse care coordinator for diabetes disease management and care coordination  Lab Results  Component Value Date   HGBA1C 6.7 (H) 09/13/2022     Interventions Today    Flowsheet Row Most Recent Value  Chronic Disease   Chronic disease during today's visit Diabetes, Other  [s/p endocarditis]  General Interventions   General Interventions Discussed/Reviewed General Interventions Discussed, General Interventions Reviewed, Doctor Visits, Labs  Doctor Visits Discussed/Reviewed Doctor Visits Discussed, Doctor Visits Reviewed, PCP, Specialist  Exercise Interventions   Exercise Discussed/Reviewed Physical Activity, Exercise Reviewed, Exercise Discussed  Physical Activity Discussed/Reviewed Physical Activity Reviewed, Physical Activity Discussed, PREP, Types of exercise  Education Interventions  Education Provided Provided Education  Provided Verbal Education On Medication, When to see the  doctor, Exercise  Pharmacy Interventions   Pharmacy Dicussed/Reviewed Pharmacy Topics Discussed, Pharmacy Topics Reviewed, Medications and their functions          SDOH assessments and interventions completed:  Yes  SDOH Interventions Today    Flowsheet Row Most Recent Value  SDOH Interventions   Food Insecurity Interventions Intervention Not Indicated  Housing Interventions Intervention Not Indicated  Transportation Interventions Intervention Not Indicated  Utilities Interventions Intervention Not Indicated  Financial Strain Interventions Intervention Not Indicated  Physical Activity Interventions PREP Program, [facility] [employer]        Care Coordination Interventions:  Yes, provided   Follow up plan: Follow up call scheduled for 03/08/23 @11 :30 AM    Encounter Outcome:  Patient Visit Completed

## 2023-01-11 NOTE — Patient Instructions (Signed)
Visit Information  Thank you for taking time to visit with me today. Please don't hesitate to contact me if I can be of assistance to you.   Following are the goals we discussed today:   Goals Addressed             This Visit's Progress    RN Care Coordination Activities: further follow up needed   On track    Care Coordination Interventions: Evaluation of current treatment plan related to acute bacterial endocarditis and patient's adherence to plan as established by provider Determined patient is progressing towards wellness, as evidence by he is feeling stronger and states in the past week he has been more physically active  Reviewed and discussed recent Cardiology and ID follow up visits Review of patient status, including review of consultant's reports, relevant laboratory and other test results, and medications completed Reviewed and discussed with patient his upcoming scheduled Echocardiogram and Cardiology follow with Dr. Robynn Pane scheduled for 01/20/23 @8 :00 AM; next ID with Dr. Thedore Mins scheduled for 01/24/23 @09 :45 AM Patient will keep all scheduled follow up appointments Patient will continue to work on establishing a routine exercise regimen as tolerated, aiming for 150 minutes weekly Patient will work on increasing his daily water intake, aiming for 48-64 oz daily unless otherwise directed by his doctor  Patient will report new symptoms or concerns to his doctor promptly Patient will continue to work with nurse care coordinator for chronic disease management and care coordination needs     To start monitoring cbgs and lower A1c   On track    Care Coordination Interventions: Provided education to patient about basic DM disease process Discussed with patient he completed a follow up with his PCP to discuss his diabetes, patient denies having questions at this time Confirmed patient received and reviewed the diabetes educational materials previously mailed, he denies having  questions a this time  Counseled on Diabetic diet, my plate method, 161 minutes of moderate intensity exercise/week Discussed plans with patient for ongoing care coordination follow up and provided patient with direct contact information for nurse care coordinator Reviewed next scheduled in person visit with PCP for med management scheduled for 02/08/23 @11 :45 AM Patient will continue to adhere to a diabetic friendly diet using the plate method and portion control  Patient will work on establishing a routine exercise regimen as tolerated, aiming for 150 minutes weekly Patient will keep all scheduled PCP follow up appointments for dm check as directed Patient will review the American Diabetes website to learn more about resources and disease education  Patient will continue to work with nurse care coordinator for diabetes disease management and care coordination  Lab Results  Component Value Date   HGBA1C 6.7 (H) 09/13/2022            Our next appointment is by telephone on 03/08/23 at 11:30 AM  Please call the care guide team at [phone] if you need to cancel or reschedule your appointment.   If you are experiencing a Mental Health or Behavioral Health Crisis or need someone to talk to, please call 1-800-273-TALK (toll free, 24 hour hotline)  Patient verbalizes understanding of instructions and care plan provided today and agrees to view in MyChart. Active MyChart status and patient understanding of how to access instructions and care plan via MyChart confirmed with patient.     Delsa Sale RN BSN CCM [place]  [facility], [facility] Nurse Care Coordinator  Direct Dial: [phone] Website: [email or web address]

## 2023-01-12 DIAGNOSIS — D485 Neoplasm of uncertain behavior of skin: Secondary | ICD-10-CM | POA: Diagnosis not present

## 2023-01-12 DIAGNOSIS — L98499 Non-pressure chronic ulcer of skin of other sites with unspecified severity: Secondary | ICD-10-CM | POA: Diagnosis not present

## 2023-01-12 DIAGNOSIS — L300 Nummular dermatitis: Secondary | ICD-10-CM | POA: Diagnosis not present

## 2023-01-12 DIAGNOSIS — C44519 Basal cell carcinoma of skin of other part of trunk: Secondary | ICD-10-CM | POA: Diagnosis not present

## 2023-01-19 NOTE — Progress Notes (Signed)
ADVANCED HEART FAILURE CLINIC NOTE  Referring Physician: [name], MD  Primary Care: [name], MD   HPI: [name] is a 72 y.o. male with coronary artery disease status post CABG x 2 with left atrial appendage clipping and PFO closure, severe aortic stenosis and mitral stenosis status post bioprosthetic AVR and MVR, bioprosthetic mitral valve endocarditis (currently on IV antibiotics), hyperlipidemia, complete heart block status post permanent pacemaker, history of atrial flutter currently presenting today for follow up.. Based on chart review prior to his bioprosthetic AVR and MVR Mr. Mailhot had preserved LV and RV function.  Shortly after the procedure he had a pacemaker placed for complete heart block.  Since that time echocardiograms demonstrate enlarging right ventricle with progressively worsening tricuspid regurgitation.    In May 2024, he presented with left upper extremity ischemia with acute occlusion of the left proximal brachial artery status post thromboembolectomy by vascular surgery.  During that time he had a TEE which demonstrated severe bioprosthetic mitral valve endocarditis; large vegetation on mitral valve leaflets.  Blood cultures were positive for Enterococcus faecalis.  He was diuresed with IV Lasix and discharged home on ampicillin and Rocephin for 6 weeks (starting 06/18/2022).    Interval hx:  Continuing to do very well. He is walking further distances without dyspnea or chest pressure. He wishes to go back and start exercising at the gym now.   Activity level/exercise tolerance: NYHA IIB Orthopnea:  Sleeps on 2 pillows Paroxysmal noctural dyspnea:  no Chest pain/pressure:  no Orthostatic lightheadedness:  no Palpitations:  no Lower extremity edema:  yes but resolved Presyncope/syncope:  no Cough:  no  Past Medical History:  Diagnosis Date   BCE (basal cell epithelioma), arm    RIGHT SHOULDER   Bradycardia    Bumps on skin    on left side  of nose for last 3 weeks   CAD (coronary artery disease)    a. s/p CABGx2 10/11/18 LIMA to LAD, SVG to RCA, EVH via right thigh.   Dyslipidemia    Ectatic thoracic aorta (HCC)    Esophageal stricture    GERD (gastroesophageal reflux disease)    Heart murmur    sees dr Rose Fillers   Hyperlipidemia    Incidental pulmonary nodule, > 3mm and < 8mm 10/04/2018   Noted on CTA   Inguinal hernia    bilateral   Obesity    Presence of permanent cardiac pacemaker    Prostate cancer (HCC)    S/P aortic valve replacement with bioprosthetic valve 10/11/2018   23 mm Edwards Inspiris Resilia stented bovine pericardial tissue valve, miltral valve done also   S/P CABG x 2 10/11/2018   LIMA to LAD, SVG to RCA, EVH via right thigh   S/P mitral valve replacement with bioprosthetic valve 10/11/2018   27 mm Medtronic Mosaic stented porcine bioprosthetic tissue valve   S/P patent foramen ovale closure 10/11/2018   S/P placement of cardiac pacemaker    Smoker     Current Outpatient Medications  Medication Sig Dispense Refill   acetaminophen (TYLENOL) 325 MG tablet Take 2 tablets (650 mg total) by mouth every 4 (four) hours as needed for moderate pain.     amoxicillin (AMOXIL) 500 MG capsule Take 1 capsule (500 mg total) by mouth 3 (three) times daily. 90 capsule 5   apixaban (ELIQUIS) 5 MG TABS tablet TAKE 1 TABLET BY MOUTH TWICE DAILY 60 tablet 11   atorvastatin (LIPITOR) 20 MG tablet Take 1 tablet (  20 mg total) by mouth daily. 90 tablet 3   clotrimazole (LOTRIMIN) 1 % cream Apply 1 Application topically 2 (two) times daily. 30 g 0   digoxin (LANOXIN) 0.125 MG tablet Take 1 tablet (0.125 mg total) by mouth daily. 90 tablet 3   empagliflozin (JARDIANCE) 10 MG TABS tablet Take 1 tablet (10 mg total) by mouth daily before breakfast. 30 tablet 11   ferrous sulfate 325 (65 FE) MG tablet Take 325 mg by mouth daily with breakfast.     furosemide (LASIX) 20 MG tablet Take 1 tablet (20 mg total) by mouth daily. 30 tablet  11   metoprolol succinate (TOPROL-XL) 100 MG 24 hr tablet Take 1 tablet (100 mg total) by mouth daily. Take with or immediately following a meal. 90 tablet 3   oxymetazoline (AFRIN) 0.05 % nasal spray Place 1 spray into both nostrils at bedtime as needed for congestion.     polyethylene glycol powder (GLYCOLAX/MIRALAX) 17 GM/SCOOP powder Take 17 g by mouth daily as needed for mild constipation. 238 g 0   spironolactone (ALDACTONE) 25 MG tablet Take 1 tablet (25 mg total) by mouth daily. 90 tablet 3   triamcinolone cream (KENALOG) 0.1 % Apply 1 Application topically 2 (two) times daily.     No current facility-administered medications for this visit.    Allergies  Allergen Reactions   Losartan Rash      Social History   Socioeconomic History   Marital status: Married    Spouse name: Not on file   Number of children: 0   Years of education: Not on file   Highest education level: Not on file  Occupational History   Occupation: Environmental manager    Employer: [employer]  Tobacco Use   Smoking status: Former    Average packs/day: 0.3 packs/day for 40.6 years (14.0 ttl pk-yrs)    Types: Cigarettes    Start date: 02/08/1978    Passive exposure: Never   Smokeless tobacco: Never  Vaping Use   Vaping status: Never Used  Substance and Sexual Activity   Alcohol use: Yes    Alcohol/week: 1.0 standard drink of alcohol    Types: 1 Cans of beer per week    Comment: rare   Drug use: No   Sexual activity: Yes  Other Topics Concern   Not on file  Social History Narrative   Not on file   Social Determinants of Health   Financial Resource Strain: Low Risk  (01/11/2023)   Overall Financial Resource Strain (CARDIA)    Difficulty of Paying Living Expenses: Not hard at all  Food Insecurity: No Food Insecurity (01/11/2023)   Hunger Vital Sign    Worried About Running Out of Food in the Last Year: Never true    Ran Out of Food in the Last Year: Never true  Transportation Needs: No Transportation  Needs (01/11/2023)   PRAPARE - Administrator, Civil Service (Medical): No    Lack of Transportation (Non-Medical): No  Physical Activity: Insufficiently Active (01/11/2023)   Exercise Vital Sign    Days of Exercise per Week: 3 days    Minutes of Exercise per Session: 20 min  Stress: No Stress Concern Present (05/11/2022)   [employer] of Occupational Health - Occupational Stress Questionnaire    Feeling of Stress : Not at all  Social Connections: Not on file  Intimate Partner Violence: Not At Risk (01/11/2023)   Humiliation, Afraid, Rape, and Kick questionnaire    Fear of Current or  Ex-Partner: No    Emotionally Abused: No    Physically Abused: No    Sexually Abused: No      Family History  Problem Relation Age of Onset   Colon cancer Mother    Lung cancer Father        smoker   CAD Neg Hx    Pancreatic cancer Neg Hx    Breast cancer Neg Hx     PHYSICAL EXAM: There were no vitals filed for this visit. GENERAL: Well nourished, well developed, and in no apparent distress at rest.  HEENT: Negative for arcus senilis or xanthelasma. There is no scleral icterus.  The mucous membranes are pink and moist.   NECK: Supple, No masses. Normal carotid upstrokes without bruits. No masses or thyromegaly.    CHEST: There are no chest wall deformities. There is no chest wall tenderness. Respirations are unlabored.  Lungs- *** CARDIAC:  JVP: *** cm          Normal rate with regular rhythm. No murmurs, rubs or gallops.  Pulses are 2+ and symmetrical in upper and lower extremities. *** edema.  ABDOMEN: Soft, non-tender, non-distended. There are no masses or hepatomegaly. There are normal bowel sounds.  EXTREMITIES: Warm and well perfused with no cyanosis, clubbing.  LYMPHATIC: No axillary or supraclavicular lymphadenopathy.  NEUROLOGIC: Patient is oriented x3 with no focal or lateralizing neurologic deficits.  PSYCH: Patients affect is appropriate, there is no evidence of anxiety  or depression.  SKIN: Warm and dry; no lesions or wounds.     DATA REVIEW  ECG: 06/03/22: NSR  as per my personal interpretation  ECHO: 07/06/22: LVEF 50-55%, large vegetation on bioprosthetic mitral valve; dilated RV.  06/16/22 (TEE): LVEF 50-55%, 4+ TR, 2x2cm vegetation on bioprosthetic mitral valve.  05/23/22: Normal LV function, severely dilated RV with moderately reduced function, moderate stenosis of prosthesis (gradient ), severe TR due to poor coaptation of leaflets, s/p AVR as per my personal interpretation 06/13/19: Normal LV function with septal bowing to the left; dilated RV with mildly reduced function. Mitral bioprosthesis gradient of 06/27/18: Normal LV function, normal RV function. Severe MS/AS with moderate AI.    CATH: 05/25/22:   Prox LAD lesion is 40% stenosed.   Mid LAD lesion is 95% stenosed.   Prox Cx to Mid Cx lesion is 30% stenosed.   1st Mrg lesion is 30% stenosed.   3rd Mrg lesion is 30% stenosed.   Prox RCA lesion is 100% stenosed.   SVG graft was visualized by angiography and is normal in caliber.   LIMA graft was visualized by angiography and is normal in caliber.   Severe mid LAD stenosis. Competitive filling is seen distally from the patent LIMA graft.  Patent LIMA graft to the mid LAD.  Mild non-obstructive disease in the Circumflex artery.  Large dominant RCA with chronic proximal occlusion. The mid and distal RCA fills from the patent vein graft.  Near normal right and left heart pressures.  CO 6.92 L/min CI 3.33  09/15/2018: Prox RCA lesion is 100% stenosed. Prox Cx to Mid Cx lesion is 30% stenosed. 1st Mrg lesion is 30% stenosed. 3rd Mrg lesion is 30% stenosed. Prox LAD lesion is 40% stenosed. Mid LAD lesion is 95% stenosed.   1. Severe stenosis mid LAD 2. Mild non-obstructive disease in the LAD 3. Chronic occlusion of the proximal RCA. Filling of the proximal, mid and distal RCA from left to right collaterals 4. Severe aortic  stenosis (mean gradient 34.1  mmHg, peak to peak gradient 32 mmHg, AVA 1.1 cm2) 5. Severe mitral stenosis by echo.     ASSESSMENT & PLAN:   1. RV dysfunction - RHC hemodynamics from 05/25/22 personally interpretted:  RA 10 (large V waves), RV 28/6-10, PA 25/15, PCWP 14,  -RV dysfunction is now likely multifactorial: he is chronically RV paced with device interrogation from 05/15/22 demonstrating 96% RV pacing, he has a dilated RV leading to poor coaptation of the tricuspid valve and as a result severe TR and in addition has likely moderate to severe stenosis of his bioprosthetic mitral valve. From a hemodynamic standpoint though the MS does not appear to be having a significant effect on his PA pressures or PCWP.  - Prior to MVR/AVR, his LV function and RV function were intact. Since that time, serial echocardiograms demonstrate worsening TR and RV dilation. Although he has moderate to severe bioprosthetic MS it has not impacted his PA pressures or PCWP enough to compromise RV function to this point. RV dilation may be secondary to chronic RV pacing leading to TR due to poor valve coaptation.  - Euvolemic on exam, continue lasix 20mg  daily.  - In the setting of recently diagnosed endocarditis he is high risk for any procedures; doing very well functionally. Will continue to medically manage.  - continue spironolactone 25mg  daily - Diffuse patchy rash started at the same time as losartan; currently holding. Will hold off until he sees dermatology next week. Would like to get him on Entresto.  -Increase to toprol 100mg  daily.  - continue digoxin , continue jardiance 10mg . Repeat digoxin level.   2. Bioprosthetic mitral valve endocarditis & stenosis - TEE on 06/16/22 with severe bioprosthetic mitral valve endocarditis - Repeat TTE on 5/28 with persistent vegetation.  - Repeat TTE today w/ mild to moderately reduced RV function; mitral valve stenosis remains severe with thickening of the mitral  valve.  - Completed 6 weeks of IV antibiotics. PICC line removed.  - Repeat TTE with thickening of mitral valve; seen by ID with repeat cultures no growth. Will continue to monitor.  - continue toprol 100mg  daily; no fevers/chills or signs of systemic infection.   3. Severe TR - Appears functional and secondary to RV dilation leading to poor coaptation. See above.   4. CAD s/p CABG x 2, closure of PFO - AVR, MVR and CABG x 2 (LIMA-LAD, SVG to RCA) in 10/11/2018 - Increase to toprol 100mg  daily - Reports no chest pain.   5. pAFL - continue metoprolol - apixaban 5mg  BID - No AFL on device interrogation.   6. Left brachial artery occlusion  - due to mitral valve endocarditis - adequate pulses b/l today - No arm pain; normal pulses  7. Healthcare maintenance - A2V of 6.7 at recent appt. Will follow with PCP now.   8. Medtronic ICD - No AT/AF; no VT - interrogated device today in clinic.   [facility] Mechanical Circulatory Support

## 2023-01-20 ENCOUNTER — Ambulatory Visit (HOSPITAL_COMMUNITY)
Admission: RE | Admit: 2023-01-20 | Discharge: 2023-01-20 | Disposition: A | Discharge status: Home / Self Care | Payer: Medicare HMO | Source: Ambulatory Visit | Attending: Cardiology | Admitting: Cardiology

## 2023-01-20 ENCOUNTER — Ambulatory Visit (HOSPITAL_COMMUNITY): Admission: RE | Admit: 2023-01-20 | Payer: Medicare HMO | Source: Ambulatory Visit

## 2023-01-20 ENCOUNTER — Encounter (HOSPITAL_COMMUNITY): Payer: Self-pay

## 2023-01-20 ENCOUNTER — Other Ambulatory Visit (HOSPITAL_COMMUNITY): Payer: Medicare HMO

## 2023-01-20 VITALS — BP 146/98 | HR 86 | Wt 214.4 lb

## 2023-01-20 DIAGNOSIS — I2510 Atherosclerotic heart disease of native coronary artery without angina pectoris: Secondary | ICD-10-CM | POA: Diagnosis not present

## 2023-01-20 DIAGNOSIS — I70208 Unspecified atherosclerosis of native arteries of extremities, other extremity: Secondary | ICD-10-CM | POA: Diagnosis not present

## 2023-01-20 DIAGNOSIS — I083 Combined rheumatic disorders of mitral, aortic and tricuspid valves: Secondary | ICD-10-CM | POA: Diagnosis not present

## 2023-01-20 DIAGNOSIS — I110 Hypertensive heart disease with heart failure: Secondary | ICD-10-CM | POA: Insufficient documentation

## 2023-01-20 DIAGNOSIS — Z950 Presence of cardiac pacemaker: Secondary | ICD-10-CM | POA: Diagnosis not present

## 2023-01-20 DIAGNOSIS — Z8774 Personal history of (corrected) congenital malformations of heart and circulatory system: Secondary | ICD-10-CM | POA: Diagnosis not present

## 2023-01-20 DIAGNOSIS — Z953 Presence of xenogenic heart valve: Secondary | ICD-10-CM | POA: Diagnosis not present

## 2023-01-20 DIAGNOSIS — Z951 Presence of aortocoronary bypass graft: Secondary | ICD-10-CM

## 2023-01-20 DIAGNOSIS — I442 Atrioventricular block, complete: Secondary | ICD-10-CM | POA: Insufficient documentation

## 2023-01-20 DIAGNOSIS — B952 Enterococcus as the cause of diseases classified elsewhere: Secondary | ICD-10-CM | POA: Insufficient documentation

## 2023-01-20 DIAGNOSIS — I4892 Unspecified atrial flutter: Secondary | ICD-10-CM | POA: Diagnosis not present

## 2023-01-20 DIAGNOSIS — E785 Hyperlipidemia, unspecified: Secondary | ICD-10-CM | POA: Diagnosis not present

## 2023-01-20 DIAGNOSIS — Z5181 Encounter for therapeutic drug level monitoring: Secondary | ICD-10-CM

## 2023-01-20 DIAGNOSIS — L309 Dermatitis, unspecified: Secondary | ICD-10-CM | POA: Insufficient documentation

## 2023-01-20 DIAGNOSIS — Z952 Presence of prosthetic heart valve: Secondary | ICD-10-CM | POA: Diagnosis not present

## 2023-01-20 DIAGNOSIS — Z79899 Other long term (current) drug therapy: Secondary | ICD-10-CM | POA: Diagnosis not present

## 2023-01-20 DIAGNOSIS — Z7901 Long term (current) use of anticoagulants: Secondary | ICD-10-CM | POA: Diagnosis not present

## 2023-01-20 DIAGNOSIS — Z87891 Personal history of nicotine dependence: Secondary | ICD-10-CM | POA: Insufficient documentation

## 2023-01-20 DIAGNOSIS — I50810 Right heart failure, unspecified: Secondary | ICD-10-CM | POA: Diagnosis not present

## 2023-01-20 DIAGNOSIS — Z7984 Long term (current) use of oral hypoglycemic drugs: Secondary | ICD-10-CM | POA: Diagnosis not present

## 2023-01-20 DIAGNOSIS — I5032 Chronic diastolic (congestive) heart failure: Secondary | ICD-10-CM

## 2023-01-20 LAB — COMPREHENSIVE METABOLIC PANEL
ALT: 25 U/L (ref 0–44)
AST: 23 U/L (ref 15–41)
Albumin: 4.2 g/dL (ref 3.5–5.0)
Alkaline Phosphatase: 62 U/L (ref 38–126)
Anion gap: 13 (ref 5–15)
BUN: 24 mg/dL — ABNORMAL HIGH (ref 8–23)
CO2: 22 mmol/L (ref 22–32)
Calcium: 10.6 mg/dL — ABNORMAL HIGH (ref 8.9–10.3)
Chloride: 101 mmol/L (ref 98–111)
Creatinine, Ser: 1.6 mg/dL — ABNORMAL HIGH (ref 0.61–1.24)
GFR, Estimated: 45 mL/min — ABNORMAL LOW (ref >60)
Glucose, Bld: 131 mg/dL — ABNORMAL HIGH (ref 70–99)
Potassium: 4.8 mmol/L (ref 3.5–5.1)
Sodium: 136 mmol/L (ref 135–145)
Total Bilirubin: 1.5 mg/dL — ABNORMAL HIGH (ref <1.2)
Total Protein: 7.7 g/dL (ref 6.5–8.1)

## 2023-01-20 LAB — BRAIN NATRIURETIC PEPTIDE: B Natriuretic Peptide: 247.6 pg/mL — ABNORMAL HIGH (ref 0.0–100.0)

## 2023-01-20 LAB — DIGOXIN LEVEL: Digoxin Level: 1.3 ng/mL (ref 0.8–2.0)

## 2023-01-20 NOTE — Patient Instructions (Signed)
Medication Changes:  No Changes In Medications at this time.   Lab Work:  Labs done today, your results will be available in MyChart, we will contact you for abnormal readings.  Follow-Up in: 4 month follow up PLEASE CALL OUR OFFICE AROUND FEBRUARY 2025 TO GET SCHEDULED FOR YOUR APPOINTMENT. PHONE NUMBER IS [phone] OPTION 2   At the [facility], you and your health needs are our priority. We have a designated team specialized in the treatment of Heart Failure. This Care Team includes your primary Heart Failure Specialized Cardiologist (physician), Advanced Practice Providers (APPs- Physician Assistants and Nurse Practitioners), and Pharmacist who all work together to provide you with the care you need, when you need it.   You may see any of the following providers on your designated Care Team at your next follow up:  Dr. Arvilla Meres Dr. Marca Ancona Dr. Dorthula Nettles Dr. Theresia Bough Tonye Becket, NP Robbie Lis, [place] [facility] [place] [facility], [place] Brynda Peon, NP [place] Lee, NP Karle Plumber, PharmD   Please be sure to bring in all your medications bottles to every appointment.   Need to Contact us:  If you have any questions or concerns before your next appointment please send [place] a message through [place] or call our office at [phone].    TO LEAVE A MESSAGE FOR THE NURSE SELECT OPTION 2, PLEASE LEAVE A MESSAGE INCLUDING: YOUR NAME DATE OF BIRTH CALL BACK NUMBER REASON FOR CALL**this is important as we prioritize the call backs  YOU WILL RECEIVE A CALL BACK THE SAME DAY AS LONG AS YOU CALL BEFORE 4:00 PM

## 2023-01-21 ENCOUNTER — Telehealth (HOSPITAL_COMMUNITY): Payer: Self-pay

## 2023-01-21 NOTE — Telephone Encounter (Signed)
error 

## 2023-01-24 ENCOUNTER — Encounter: Payer: Self-pay | Admitting: Internal Medicine

## 2023-01-24 ENCOUNTER — Ambulatory Visit (INDEPENDENT_AMBULATORY_CARE_PROVIDER_SITE_OTHER): Payer: Medicare HMO | Admitting: Internal Medicine

## 2023-01-24 ENCOUNTER — Other Ambulatory Visit: Payer: Self-pay

## 2023-01-24 VITALS — BP 140/92 | HR 83 | Temp 97.6°F | Ht 67.0 in | Wt 219.0 lb

## 2023-01-24 DIAGNOSIS — I330 Acute and subacute infective endocarditis: Secondary | ICD-10-CM

## 2023-01-24 NOTE — Patient Instructions (Signed)
-  RENAL FUNCTION: Your recent labs showed elevated creatinine levels, which can indicate kidney issues. We will repeat your comprehensive metabolic panel (CMP) today to reassess your kidney function and adjust your amoxicillin dose if necessary.  -GENERAL HEALTH MAINTENANCE: Continue taking your current medications. We will follow up in 6 months.   INSTRUCTIONS:  Please have your blood drawn today for the repeat CMP. Follow up in one month to reassess your renal function. CBC as well.

## 2023-01-24 NOTE — Progress Notes (Signed)
Patient Active Problem List   Diagnosis Date Noted   Chronic diastolic heart failure (HCC) 06/18/2022   Endocarditis 06/17/2022   Bacteremia due to Enterococcus 06/17/2022   Acute CVA (cerebrovascular accident) (HCC) 06/17/2022   Ischemia of left upper extremity 06/15/2022   Arterial occlusion 06/15/2022   Brachial artery occlusion, left (HCC) 06/15/2022   Paroxysmal A-fib (HCC) 06/15/2022   Secondary hypercoagulable state (HCC) 06/15/2022   History of mitral valve repair 06/15/2022   Demand ischemia (HCC) 05/25/2022   NSTEMI (non-ST elevated myocardial infarction) (HCC) 05/23/2022   Senile purpura (HCC) 05/18/2022   Atrial flutter (HCC) 09/02/2021   Atherosclerosis of aorta (HCC) 04/30/2019   Actinic keratosis 04/30/2019   Malignant neoplasm of prostate (HCC) 02/06/2019   Complete heart block (HCC) 01/18/2019   Pacemaker 01/18/2019   Encounter for therapeutic drug monitoring 10/23/2018   H/O aortic valve replacement 10/11/2018   S/P CABG x 2 10/11/2018   History of mitral valve replacement 10/11/2018   Incidental pulmonary nodule, > 3mm and < 8mm 10/04/2018   Coronary artery disease involving native coronary artery of native heart without angina pectoris    Mixed hyperlipidemia 06/15/2017   Hypogonadism in male 11/05/2015   Former smoker 12/27/2011   Obesity (BMI 30-39.9) 12/27/2011   History of esophageal stricture 12/27/2011    Patient's Medications  New Prescriptions   No medications on file  Previous Medications   ACETAMINOPHEN (TYLENOL) 325 MG TABLET    Take 2 tablets (650 mg total) by mouth every 4 (four) hours as needed for moderate pain.   AMOXICILLIN (AMOXIL) 500 MG CAPSULE    Take 1 capsule (500 mg total) by mouth 3 (three) times daily.   APIXABAN (ELIQUIS) 5 MG TABS TABLET    TAKE 1 TABLET BY MOUTH TWICE DAILY   ATORVASTATIN (LIPITOR) 20 MG TABLET    Take 1 tablet (20 mg total) by mouth daily.   CLOTRIMAZOLE (LOTRIMIN) 1 % CREAM    Apply 1  Application topically 2 (two) times daily.   DIGOXIN (LANOXIN) 0.125 MG TABLET    Take 1 tablet (0.125 mg total) by mouth daily.   EMPAGLIFLOZIN (JARDIANCE) 10 MG TABS TABLET    Take 1 tablet (10 mg total) by mouth daily before breakfast.   FERROUS SULFATE 325 (65 FE) MG TABLET    Take 325 mg by mouth daily with breakfast.   FUROSEMIDE (LASIX) 20 MG TABLET    Take 1 tablet (20 mg total) by mouth daily.   METOPROLOL SUCCINATE (TOPROL-XL) 100 MG 24 HR TABLET    Take 1 tablet (100 mg total) by mouth daily. Take with or immediately following a meal.   OXYMETAZOLINE (AFRIN) 0.05 % NASAL SPRAY    Place 1 spray into both nostrils at bedtime as needed for congestion.   POLYETHYLENE GLYCOL POWDER (GLYCOLAX/MIRALAX) 17 GM/SCOOP POWDER    Take 17 g by mouth daily as needed for mild constipation.   SPIRONOLACTONE (ALDACTONE) 25 MG TABLET    Take 1 tablet (25 mg total) by mouth daily.   TRIAMCINOLONE CREAM (KENALOG) 0.1 %    Apply 1 Application topically 2 (two) times daily.  Modified Medications   No medications on file  Discontinued Medications   No medications on file    Subjective: 72 y.o. with PMHx as below and with bioprosthetic AVR and by bioprosthetic MVR admitted with acute thrombus to left lower extremity requiring urgent surgery presents for follow-up on E faecalis bacteremia . Discharged  on IV abc x 6 weeks.    07/12/22: Doing well tolerating abx. Deny fevers. States takin yogurt with abx.  08/19/22: tolerating abx.  Today  11/23/22. Pt noted a pruiritc rash aobut a month ago that comes and goes all over his body. Coninues to take amox. .   Today 12/16 : Discussed the use of AI scribe software for clinical note transcription with the patient, who gave verbal consent to proceed.  The patient, with a history of dermatitis, presents for follow-up after a recent biopsy. He reports that the rash has resolved, leaving only scar tissue. 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 He was  previously treated with a topical steroid cream, which he has since discontinued. He also received a steroid injection, which provided relief.  In addition to the dermatitis, the patient has been on amoxicillin, which he tolerates well. He also has a history of elevated creatinine levels, which were noted during a recent visit with his cardiologist. The patient has not taken Lasix for the past two days.        Review of Systems: Review of Systems  All other systems reviewed and are negative.   Past Medical History:  Diagnosis Date   BCE (basal cell epithelioma), arm    RIGHT SHOULDER   Bradycardia    Bumps on skin    on left side of nose for last 3 weeks   CAD (coronary artery disease)    a. s/p CABGx2 10/11/18 LIMA to LAD, SVG to RCA, EVH via right thigh.   Dyslipidemia    Ectatic thoracic aorta (HCC)    Esophageal stricture    GERD (gastroesophageal reflux disease)    Heart murmur    sees dr Rose Fillers   Hyperlipidemia    Incidental pulmonary nodule, > 3mm and < 8mm 10/04/2018   Noted on CTA   Inguinal hernia    bilateral   Obesity    Presence of permanent cardiac pacemaker    Prostate cancer (HCC)    S/P aortic valve replacement with bioprosthetic valve 10/11/2018   23 mm Edwards Inspiris Resilia stented bovine pericardial tissue valve, miltral valve done also   S/P CABG x 2 10/11/2018   LIMA to LAD, SVG to RCA, EVH via right thigh   S/P mitral valve replacement with bioprosthetic valve 10/11/2018   27 mm Medtronic Mosaic stented porcine bioprosthetic tissue valve   S/P patent foramen ovale closure 10/11/2018   S/P placement of cardiac pacemaker    Smoker     Social History   Tobacco Use   Smoking status: Former    Average packs/day: 0.3 packs/day for 40.6 years (14.0 ttl pk-yrs)    Types: Cigarettes    Start date: 02/08/1978    Passive exposure: Never   Smokeless tobacco: Never  Vaping Use   Vaping status: Never Used  Substance Use Topics   Alcohol use: Yes     Alcohol/week: 1.0 standard drink of alcohol    Types: 1 Cans of beer per week    Comment: rare   Drug use: No    Family History  Problem Relation Age of Onset   Colon cancer Mother    Lung cancer Father        smoker   CAD Neg Hx    Pancreatic cancer Neg Hx    Breast cancer Neg Hx     Allergies  Allergen Reactions   Losartan Rash    Health Maintenance  Topic Date Due   Diabetic  kidney evaluation - Urine ACR  Never done   COVID-19 Vaccine (4 - 2024-25 season) 10/10/2022   Pneumonia Vaccine 19+ Years old (3 of 3 - PPSV23 or PCV20) 07/12/2023 (Originally 11/04/2016)   Medicare Annual Wellness (AWV)  05/11/2023   Colonoscopy  06/02/2023   Diabetic kidney evaluation - eGFR measurement  01/20/2024   INFLUENZA VACCINE  Completed   Hepatitis C Screening  Completed   Zoster Vaccines- Shingrix  Completed   HPV VACCINES  Aged Out   DTaP/Tdap/Td  Discontinued    Objective:  Vitals:   01/24/23 0930  BP: (!) 140/92  Pulse: 83  Temp: 97.6 F (36.4 C)  TempSrc: Temporal  SpO2: 97%  Weight: 219 lb (99.3 kg)  Height: 5\' 7"  (1.702 m)   Body mass index is 34.3 kg/m.  Physical Exam Constitutional:      General: He is not in acute distress.    Appearance: He is normal weight. He is not toxic-appearing.  HENT:     Head: Normocephalic and atraumatic.     Right Ear: External ear normal.     Left Ear: External ear normal.     Nose: No congestion or rhinorrhea.     Mouth/Throat:     Mouth: Mucous membranes are moist.     Pharynx: Oropharynx is clear.  Eyes:     Extraocular Movements: Extraocular movements intact.     Conjunctiva/sclera: Conjunctivae normal.     Pupils: Pupils are equal, round, and reactive to light.  Cardiovascular:     Rate and Rhythm: Normal rate and regular rhythm.     Heart sounds: No murmur heard.    No friction rub. No gallop.  Pulmonary:     Effort: Pulmonary effort is normal.     Breath sounds: Normal breath sounds.  Abdominal:     General:  Abdomen is flat. Bowel sounds are normal.     Palpations: Abdomen is soft.  Musculoskeletal:        General: No swelling. Normal range of motion.     Cervical back: Normal range of motion and neck supple.  Skin:    General: Skin is warm and dry.  Neurological:     General: No focal deficit present.     Mental Status: He is oriented to person, place, and time.  Psychiatric:        Mood and Affect: Mood normal.    Physical Exam   SKIN: Improvement in rash on legs noted.      Lab Results Lab Results  Component Value Date   WBC 8.6 11/23/2022   HGB 14.1 11/23/2022   HCT 43.6 11/23/2022   MCV 82.3 11/23/2022   PLT 185 11/23/2022    Lab Results  Component Value Date   CREATININE 1.60 (H) 01/20/2023   BUN 24 (H) 01/20/2023   NA 136 01/20/2023   K 4.8 01/20/2023   CL 101 01/20/2023   CO2 22 01/20/2023    Lab Results  Component Value Date   ALT 25 01/20/2023   AST 23 01/20/2023   ALKPHOS 62 01/20/2023   BILITOT 1.5 (H) 01/20/2023    Lab Results  Component Value Date   CHOL 100 11/19/2022   HDL 35 (L) 11/19/2022   LDLCALC 50 11/19/2022   TRIG 74 11/19/2022   CHOLHDL 2.9 11/19/2022   No results found for: "LABRPR", "RPRTITER" No results found for: "HIV1RNAQUANT", "HIV1RNAVL", "CD4TABS"   Problem List Items Addressed This Visit       Cardiovascular and Mediastinum  Endocarditis - Primary   Relevant Orders   CBC with Differential/Platelet   COMPLETE METABOLIC PANEL WITH GFR   Results   LABS Creatinine: 1.6 (01/20/2023)  PATHOLOGY Biopsy: Spongiotic dermatitis (11/25/2022)      Assessment/Plan #E faecalis bacteremia with bioprosthetic mitral valve and bioprosthetic aortic valve endocarditis complicated by embolization to left arm #CAD status post CABG #Aortic valve replacement bovine # Mitral valve replacement bioprosthetic #Complete heart block status post PPM -SP left upper extremity thrombectomy via brachial radial artery and angiogram on 5/7 with  Dr. Randie Heinz, vascular surgery. Vascular surgery following -TEE  showed large vegetation encompassing majority of mitral valve leaflet, aortic bovine valve with small subcentimeter vegetation. -Seen by CTS Dr. Cliffton Asters, noted that given all his comorbidities the likelihood of subsequent surgical transfer limited.  Recommended medical management and antibiotic therapy at this point. -Discharged on Ampicillin 12 gm q24h continous infusion and ctx 2 gm q12h  x 6 weeks->amox 1gm tid  started on 6/20 fo rpO suppression. I opted for higher dose suppresion given 2 bioprosthetic valce involvement -TTE on 5/28 showed redemonstration of mitral valve prosthetic vegetation and LV chordal redundancy versus likely vegetation.  TTE on 7/5 showed smaller mobile elements on prosthetic mitral valve, abnormal thickness seen on prosthetic aortic valve. Plan: -Labs today  -Continue amox 500 tid -F/U in 6 months  #AKI Elevated creatinine (1.6) noted on recent labs. Pt states lasix re-adjusted. Noted that has been off Lasix for two days. -Repeat CMP today to assess renal function.   #Rash -Improved. Bx c/w eczematous dermatitis.  Assessment and Plan      Danelle Earthly, MD [facility] 01/24/2023, 10:05 AM   I have personally spent 42 minutes involved in face-to-face and non-face-to-face activities for this patient on the day of the visit. Professional time spent includes the following activities: Preparing to see the patient (review of tests), Obtaining and/or reviewing separately obtained history (admission/discharge record), Performing a medically appropriate examination and/or evaluation , Ordering medications/tests/procedures, referring and communicating with other health care professionals, Documenting clinical information in the EMR, Independently interpreting results (not separately reported), Communicating results to the patient/family/caregiver,  Counseling and educating the patient/family/caregiver and Care coordination (not separately reported).

## 2023-01-25 LAB — CBC WITH DIFFERENTIAL/PLATELET
Absolute Lymphocytes: 1102 {cells}/uL (ref 850–3900)
Absolute Monocytes: 851 {cells}/uL (ref 200–950)
Basophils Absolute: 41 {cells}/uL (ref 0–200)
Basophils Relative: 0.5 %
Eosinophils Absolute: 89 {cells}/uL (ref 15–500)
Eosinophils Relative: 1.1 %
HCT: 47.5 % (ref 38.5–50.0)
Hemoglobin: 15.7 g/dL (ref 13.2–17.1)
MCH: 29 pg (ref 27.0–33.0)
MCHC: 33.1 g/dL (ref 32.0–36.0)
MCV: 87.6 fL (ref 80.0–100.0)
MPV: 11.4 fL (ref 7.5–12.5)
Monocytes Relative: 10.5 %
Neutro Abs: 6018 {cells}/uL (ref 1500–7800)
Neutrophils Relative %: 74.3 %
Platelets: 179 10*3/uL (ref 140–400)
RBC: 5.42 10*6/uL (ref 4.20–5.80)
RDW: 15.5 % — ABNORMAL HIGH (ref 11.0–15.0)
Total Lymphocyte: 13.6 %
WBC: 8.1 10*3/uL (ref 3.8–10.8)

## 2023-01-25 LAB — COMPLETE METABOLIC PANEL WITH GFR
AG Ratio: 1.6 (calc) (ref 1.0–2.5)
ALT: 20 U/L (ref 9–46)
AST: 18 U/L (ref 10–35)
Albumin: 4.2 g/dL (ref 3.6–5.1)
Alkaline phosphatase (APISO): 61 U/L (ref 35–144)
BUN/Creatinine Ratio: 24 (calc) — ABNORMAL HIGH (ref 6–22)
BUN: 28 mg/dL — ABNORMAL HIGH (ref 7–25)
CO2: 23 mmol/L (ref 20–32)
Calcium: 9.4 mg/dL (ref 8.6–10.3)
Chloride: 103 mmol/L (ref 98–110)
Creat: 1.18 mg/dL (ref 0.70–1.28)
Globulin: 2.7 g/dL (ref 1.9–3.7)
Glucose, Bld: 111 mg/dL — ABNORMAL HIGH (ref 65–99)
Potassium: 4.3 mmol/L (ref 3.5–5.3)
Sodium: 137 mmol/L (ref 135–146)
Total Bilirubin: 1 mg/dL (ref 0.2–1.2)
Total Protein: 6.9 g/dL (ref 6.1–8.1)
eGFR: 66 mL/min/{1.73_m2} (ref >=60)

## 2023-01-26 ENCOUNTER — Telehealth: Payer: Self-pay | Admitting: *Deleted

## 2023-01-26 DIAGNOSIS — C44519 Basal cell carcinoma of skin of other part of trunk: Secondary | ICD-10-CM | POA: Diagnosis not present

## 2023-01-26 NOTE — Telephone Encounter (Signed)
   Pre-operative Risk Assessment    Patient Name: [name]  DOB: 1950/05/02 MRN: [id number]  DATE OF LAST VISIT: 01/04/23 Otilio Saber, PAC DATE OF NEXT VISIT: NONE    Request for Surgical Clearance    Procedure:   COLONOSCOPY/ENDOSCOPY  Date of Surgery:  Clearance 03/22/23                                 Surgeon:  DR. Levora Angel Surgeon's Group or Practice Name:  [facility] Phone number:  [phone] Fax number:  [phone]   Type of Clearance Requested:   - Medical  - Pharmacy:  Hold Apixaban (Eliquis) x 2 DAYS PRIOR   Type of Anesthesia:   PROPOFOL   Additional requests/questions:    Elpidio Anis   01/26/2023, 10:42 AM

## 2023-01-28 NOTE — Telephone Encounter (Signed)
Patient with diagnosis of atrial fibrillation on Eliquis for anticoagulation.    Procedure:   COLONOSCOPY/ENDOSCOPY   Date of Surgery:  Clearance 03/22/23   CHA2DS2-VASc Score = 6   This indicates a 9.7% annual risk of stroke. The patient's score is based upon: CHF History: 1 HTN History: 1 Diabetes History: 0 Stroke History: 2 Vascular Disease History: 1 Age Score: 1 Gender Score: 0   Patient had CVA and L proximal brachial artery occlusion, both 06/2022 (around time of transition warfarin to Eliquis)  CrCl 79 Platelet count 179  Per office protocol, patient can hold Eliquis for 1-2 days prior to procedure.  (Would prefer 1 day, but can hold 2 at discretion of procedural MD)  Patient will not need bridging with Lovenox (enoxaparin) around procedure.  **This guidance is not considered finalized until pre-operative APP has relayed final recommendations.**

## 2023-02-01 ENCOUNTER — Ambulatory Visit (HOSPITAL_COMMUNITY)
Admission: RE | Admit: 2023-02-01 | Discharge: 2023-02-01 | Disposition: A | Discharge status: Home / Self Care | Payer: Medicare HMO | Source: Ambulatory Visit | Attending: Family Medicine | Admitting: Family Medicine

## 2023-02-01 DIAGNOSIS — I509 Heart failure, unspecified: Secondary | ICD-10-CM | POA: Insufficient documentation

## 2023-02-01 DIAGNOSIS — I4892 Unspecified atrial flutter: Secondary | ICD-10-CM | POA: Diagnosis not present

## 2023-02-01 DIAGNOSIS — I081 Rheumatic disorders of both mitral and tricuspid valves: Secondary | ICD-10-CM | POA: Diagnosis not present

## 2023-02-01 DIAGNOSIS — I5032 Chronic diastolic (congestive) heart failure: Secondary | ICD-10-CM | POA: Diagnosis not present

## 2023-02-01 DIAGNOSIS — Z951 Presence of aortocoronary bypass graft: Secondary | ICD-10-CM | POA: Diagnosis not present

## 2023-02-01 DIAGNOSIS — I2510 Atherosclerotic heart disease of native coronary artery without angina pectoris: Secondary | ICD-10-CM | POA: Insufficient documentation

## 2023-02-01 LAB — ECHOCARDIOGRAM COMPLETE
AR max vel: 2.59 cm2
AV Area VTI: 2.28 cm2
AV Area mean vel: 2.48 cm2
AV Mean grad: 11 mm[Hg]
AV Peak grad: 17.9 mm[Hg]
Ao pk vel: 2.12 m/s
Calc EF: 65.6 %
MV VTI: 1.59 cm2
S' Lateral: 2.2 cm
Single Plane A2C EF: 72.3 %
Single Plane A4C EF: 54.4 %

## 2023-02-01 MED ORDER — PERFLUTREN LIPID MICROSPHERE
1.0000 mL | INTRAVENOUS | Status: AC | PRN
  Administered 2023-02-01: 2 mL via INTRAVENOUS

## 2023-02-07 NOTE — Telephone Encounter (Signed)
   Patient Name: [name]  DOB: 1950/11/11 MRN: [id number]  Primary Cardiologist: Verne Carrow, MD  Chart reviewed as part of pre-operative protocol coverage. Pre-op clearance already addressed by colleagues in earlier phone notes. To summarize recommendations:  -Per office protocol, patient can hold Eliquis for 1-2 days prior to procedure.  (Would prefer 1 day, but can hold 2 at discretion of procedural MD)   He is low risk to proceed.   Adi Sabharwal  No further testing indicated at this time.   Will route this bundled recommendation to requesting provider via Epic fax function and remove from pre-op pool. Please call with questions.  Sharlene Dory, PA-C 02/07/2023, 7:38 AM

## 2023-02-08 ENCOUNTER — Encounter: Payer: Self-pay | Admitting: Family Medicine

## 2023-02-08 ENCOUNTER — Other Ambulatory Visit (HOSPITAL_COMMUNITY): Payer: Self-pay | Admitting: Gastroenterology

## 2023-02-08 ENCOUNTER — Ambulatory Visit (INDEPENDENT_AMBULATORY_CARE_PROVIDER_SITE_OTHER): Payer: Medicare HMO | Admitting: Family Medicine

## 2023-02-08 VITALS — BP 114/76 | HR 88 | Ht 68.0 in | Wt 217.8 lb

## 2023-02-08 DIAGNOSIS — E119 Type 2 diabetes mellitus without complications: Secondary | ICD-10-CM | POA: Diagnosis not present

## 2023-02-08 DIAGNOSIS — Z953 Presence of xenogenic heart valve: Secondary | ICD-10-CM

## 2023-02-08 DIAGNOSIS — I330 Acute and subacute infective endocarditis: Secondary | ICD-10-CM | POA: Diagnosis not present

## 2023-02-08 DIAGNOSIS — C61 Malignant neoplasm of prostate: Secondary | ICD-10-CM

## 2023-02-08 NOTE — Progress Notes (Signed)
   Subjective:    Patient ID: [name], male    DOB: [birth year], 72 y.o.   MRN: [id number]  HPI He is here for an interval evaluation.  He is currently being treated for bacterial endocarditis and is taking amoxicillin  3 times per day.  He is followed regularly by infectious disease.  He also sees cardiology and apparently Jardiance  was started to help with a cardiac issue but he also has diabetes with a hemoglobin A1c of 6.7.  He has had both aortic and mitral valve replacements.  He also states that apparently there has been some damage to his tricuspid valves.  He does have a history of prostate cancer and is being followed regularly by urology.  He has noted some bilateral rib pain especially if he takes a deep breath but seems to relate this to starting an exercise program especially with upper body exercises.  He is immunizations were reviewed.   Review of Systems     Objective:    Physical Exam Alert and in no distress.  Lungs are clear to auscultation.  No tenderness to palpation over the ribs.       Assessment & Plan:  New onset type 2 diabetes mellitus (HCC)  Malignant neoplasm of prostate (HCC)  S/P aortic + mitral valve replacement with bioprosthetic valves  Acute bacterial endocarditis  At this point he seems to be in a good holding pattern taking his antibiotics and other medications as well being followed by infectious disease and cardiology.  He does see urology on an occasional basis. Recommend he get Tdap and RSV and I will check him in a couple of months to follow-up on the diabetes.

## 2023-02-14 ENCOUNTER — Ambulatory Visit (INDEPENDENT_AMBULATORY_CARE_PROVIDER_SITE_OTHER): Payer: Medicare HMO

## 2023-02-14 DIAGNOSIS — I442 Atrioventricular block, complete: Secondary | ICD-10-CM | POA: Diagnosis not present

## 2023-02-15 LAB — CUP PACEART REMOTE DEVICE CHECK
Battery Remaining Longevity: 72 mo
Battery Voltage: 2.96 V
Brady Statistic AP VP Percent: 1.03 %
Brady Statistic AP VS Percent: 0.01 %
Brady Statistic AS VP Percent: 88.25 %
Brady Statistic AS VS Percent: 10.71 %
Brady Statistic RA Percent Paced: 1.43 %
Brady Statistic RV Percent Paced: 89.28 %
Date Time Interrogation Session: 20250105195547
Implantable Lead Connection Status: 753985
Implantable Lead Connection Status: 753985
Implantable Lead Implant Date: 20200908
Implantable Lead Implant Date: 20200908
Implantable Lead Location: 753859
Implantable Lead Location: 753860
Implantable Lead Model: 3830
Implantable Lead Model: 5076
Implantable Pulse Generator Implant Date: 20200908
Lead Channel Impedance Value: 285 Ohm
Lead Channel Impedance Value: 342 Ohm
Lead Channel Impedance Value: 342 Ohm
Lead Channel Impedance Value: 456 Ohm
Lead Channel Pacing Threshold Amplitude: 0.625 V
Lead Channel Pacing Threshold Amplitude: 1 V
Lead Channel Pacing Threshold Pulse Width: 0.4 ms
Lead Channel Pacing Threshold Pulse Width: 0.4 ms
Lead Channel Sensing Intrinsic Amplitude: 1.375 mV
Lead Channel Sensing Intrinsic Amplitude: 1.375 mV
Lead Channel Sensing Intrinsic Amplitude: 10.5 mV
Lead Channel Sensing Intrinsic Amplitude: 10.5 mV
Lead Channel Setting Pacing Amplitude: 1.5 V
Lead Channel Setting Pacing Amplitude: 2.5 V
Lead Channel Setting Pacing Pulse Width: 0.8 ms
Lead Channel Setting Sensing Sensitivity: 1.2 mV
Zone Setting Status: 755011
Zone Setting Status: 755011

## 2023-03-02 ENCOUNTER — Other Ambulatory Visit (HOSPITAL_COMMUNITY): Payer: Self-pay | Admitting: Cardiology

## 2023-03-08 ENCOUNTER — Ambulatory Visit: Payer: Self-pay

## 2023-03-08 NOTE — Patient Outreach (Signed)
Care Coordination   Follow Up Visit Note   03/08/2023 Name: [name] MRN: [id number] DOB: 1950/07/03  [name] is a 73 y.o. year old male who sees [name], MD for primary care. I spoke with  [name] by phone today.  What matters to the patients health and wellness today?  Patient would like to continue light weight lifting to help build stamina and endurance.     Goals Addressed             This Visit's Progress    RN Care Coordination Activities: further follow up needed   On track    Care Coordination Interventions: Evaluation of current treatment plan related to acute bacterial endocarditis and patient's adherence to plan as established by provider Determined patient is progressing towards wellness, as evidence by he is feeling stronger and states in the past week he has been more physically active  Discussed with patient his recent onset of pain to his intercostals with inhalation Determined patient started weight lifting at home, he started off with 8 lb weights, this my have been too much weight considering his deconditioning Determined patient reported this symptom to his PCP during last visit  Reviewed provider established plan for pain management Counseled on the importance of reporting any/all new or changed pain symptoms or management strategies to pain management provider Advised patient to report to care team affect of pain on daily activities Discussed use of relaxation techniques and/or diversional activities to assist with pain reduction (distraction, imagery, relaxation, massage, acupressure, TENS, heat, and cold application Reviewed with patient prescribed pharmacological and nonpharmacological pain relief strategies Patient will keep all scheduled follow up appointments Patient will continue to work on establishing a routine exercise regimen as tolerated, aiming for 150 minutes weekly Patient will report new symptoms or concerns to his  doctor promptly Patient will continue to work with nurse care coordinator for chronic disease management and care coordination needs     To start monitoring cbgs and lower A1c   On track    Care Coordination Interventions: Evaluation of current treatment plan related to type 2 diabetes and patient's adherence to plan as established by provider  Determined patient is currently not self checking his cbg's at home due to his PCP advised this is not necessary at this time Re-educated patient regarding importance of adherence to following a diabetic diet, my plate method, using portion control, 150 minutes of moderate intensity exercise/week as tolerated Educated patient with rationale about the importance of performing daily foot inspections and having yearly eye exams Reviewed and discussed with patient his next scheduled PCP follow up for diabetes check with Dr. Susann Givens is scheduled for 05/25/23 @09 :30 AM Discussed plans with patient for ongoing care coordination follow up and provided patient with direct contact information for nurse care coordinator Patient will continue to adhere to a diabetic friendly diet using the plate method and portion control  Patient will work on establishing a routine exercise regimen as tolerated, aiming for 150 minutes weekly, starting off slowly and working his way up as tolerated  Patient will keep all scheduled PCP follow up appointments for dm check as directed Patient will continue to work with nurse care coordinator for diabetes disease management and care coordination  Lab Results  Component Value Date   HGBA1C 6.7 (H) 09/13/2022        Interventions Today    Flowsheet Row Most Recent Value  Chronic Disease   Chronic disease during  today's visit Diabetes, Other  [inflammation to intercostals]  General Interventions   General Interventions Discussed/Reviewed General Interventions Discussed, General Interventions Reviewed, Annual Eye Exam, Annual Foot  Exam, Doctor Visits, Labs  Doctor Visits Discussed/Reviewed Doctor Visits Discussed, Doctor Visits Reviewed, PCP, Specialist  Exercise Interventions   Exercise Discussed/Reviewed Physical Activity, Exercise Reviewed, Exercise Discussed  Physical Activity Discussed/Reviewed Physical Activity Discussed, Physical Activity Reviewed, Types of exercise  Education Interventions   Education Provided Provided Education  Provided Verbal Education On Nutrition, Foot Care, Eye Care, Labs, Exercise, Medication, When to see the doctor  Labs Reviewed Hgb A1c, Kidney Function  Nutrition Interventions   Nutrition Discussed/Reviewed Nutrition Discussed, Nutrition Reviewed          SDOH assessments and interventions completed:  No     Care Coordination Interventions:  Yes, provided   Follow up plan: Follow up call scheduled for 05/27/23 @11 :30 AM    Encounter Outcome:  Patient Visit Completed

## 2023-03-08 NOTE — Patient Instructions (Signed)
Visit Information  Thank you for taking time to visit with me today. Please don't hesitate to contact me if I can be of assistance to you.   Following are the goals we discussed today:   Goals Addressed             This Visit's Progress    RN Care Coordination Activities: further follow up needed   On track    Care Coordination Interventions: Evaluation of current treatment plan related to acute bacterial endocarditis and patient's adherence to plan as established by provider Determined patient is progressing towards wellness, as evidence by he is feeling stronger and states in the past week he has been more physically active  Discussed with patient his recent onset of pain to his intercostals with inhalation Determined patient started weight lifting at home, he started off with 8 lb weights, this my have been too much weight considering his deconditioning Determined patient reported this symptom to his PCP during last visit  Reviewed provider established plan for pain management Counseled on the importance of reporting any/all new or changed pain symptoms or management strategies to pain management provider Advised patient to report to care team affect of pain on daily activities Discussed use of relaxation techniques and/or diversional activities to assist with pain reduction (distraction, imagery, relaxation, massage, acupressure, TENS, heat, and cold application Reviewed with patient prescribed pharmacological and nonpharmacological pain relief strategies Patient will keep all scheduled follow up appointments Patient will continue to work on establishing a routine exercise regimen as tolerated, aiming for 150 minutes weekly Patient will report new symptoms or concerns to his doctor promptly Patient will continue to work with nurse care coordinator for chronic disease management and care coordination needs     To start monitoring cbgs and lower A1c   On track    Care Coordination  Interventions: Evaluation of current treatment plan related to type 2 diabetes and patient's adherence to plan as established by provider  Determined patient is currently not self checking his cbg's at home due to his PCP advised this is not necessary at this time Re-educated patient regarding importance of adherence to following a diabetic diet, my plate method, using portion control, 150 minutes of moderate intensity exercise/week as tolerated Educated patient with rationale about the importance of performing daily foot inspections and having yearly eye exams Reviewed and discussed with patient his next scheduled PCP follow up for diabetes check with Dr. Susann Givens is scheduled for 05/25/23 @09 :30 AM Discussed plans with patient for ongoing care coordination follow up and provided patient with direct contact information for nurse care coordinator Patient will continue to adhere to a diabetic friendly diet using the plate method and portion control  Patient will work on establishing a routine exercise regimen as tolerated, aiming for 150 minutes weekly, starting off slowly and working his way up as tolerated  Patient will keep all scheduled PCP follow up appointments for dm check as directed Patient will continue to work with nurse care coordinator for diabetes disease management and care coordination  Lab Results  Component Value Date   HGBA1C 6.7 (H) 09/13/2022            Our next appointment is by telephone on 05/27/23 at 11:30 AM  Please call the care guide team at [phone] if you need to cancel or reschedule your appointment.   If you are experiencing a Mental Health or Behavioral Health Crisis or need someone to talk to, please call 1-800-273-TALK (toll free, 24  hour hotline)  Patient verbalizes understanding of instructions and care plan provided today and agrees to view in MyChart. Active MyChart status and patient understanding of how to access instructions and care plan via  MyChart confirmed with patient.     Delsa Sale RN BSN CCM [place]  [facility], [facility] Nurse Care Coordinator  Direct Dial: [phone] Website: [email or web address]

## 2023-03-16 ENCOUNTER — Encounter (HOSPITAL_COMMUNITY): Payer: Self-pay | Admitting: Gastroenterology

## 2023-03-21 ENCOUNTER — Other Ambulatory Visit: Payer: Self-pay | Admitting: Internal Medicine

## 2023-03-22 ENCOUNTER — Ambulatory Visit (HOSPITAL_COMMUNITY): Payer: Medicare HMO | Admitting: Registered Nurse

## 2023-03-22 ENCOUNTER — Ambulatory Visit (HOSPITAL_COMMUNITY)
Admission: RE | Admit: 2023-03-22 | Discharge: 2023-03-22 | Disposition: A | Discharge status: Home / Self Care | Payer: Medicare HMO | Attending: Gastroenterology | Admitting: Gastroenterology

## 2023-03-22 ENCOUNTER — Other Ambulatory Visit: Payer: Self-pay

## 2023-03-22 ENCOUNTER — Ambulatory Visit (HOSPITAL_BASED_OUTPATIENT_CLINIC_OR_DEPARTMENT_OTHER): Payer: Medicare HMO | Admitting: Registered Nurse

## 2023-03-22 ENCOUNTER — Encounter (HOSPITAL_COMMUNITY)
Admission: RE | Disposition: A | Discharge status: Home / Self Care | Payer: Self-pay | Source: Home / Self Care | Attending: Gastroenterology

## 2023-03-22 ENCOUNTER — Encounter (HOSPITAL_COMMUNITY): Payer: Self-pay | Admitting: Gastroenterology

## 2023-03-22 DIAGNOSIS — K2950 Unspecified chronic gastritis without bleeding: Secondary | ICD-10-CM | POA: Diagnosis not present

## 2023-03-22 DIAGNOSIS — R12 Heartburn: Secondary | ICD-10-CM

## 2023-03-22 DIAGNOSIS — I442 Atrioventricular block, complete: Secondary | ICD-10-CM | POA: Insufficient documentation

## 2023-03-22 DIAGNOSIS — E785 Hyperlipidemia, unspecified: Secondary | ICD-10-CM | POA: Insufficient documentation

## 2023-03-22 DIAGNOSIS — K31A14 Gastric intestinal metaplasia without dysplasia, involving the cardia: Secondary | ICD-10-CM | POA: Diagnosis not present

## 2023-03-22 DIAGNOSIS — K648 Other hemorrhoids: Secondary | ICD-10-CM | POA: Diagnosis not present

## 2023-03-22 DIAGNOSIS — Z8774 Personal history of (corrected) congenital malformations of heart and circulatory system: Secondary | ICD-10-CM | POA: Diagnosis not present

## 2023-03-22 DIAGNOSIS — D122 Benign neoplasm of ascending colon: Secondary | ICD-10-CM | POA: Insufficient documentation

## 2023-03-22 DIAGNOSIS — K449 Diaphragmatic hernia without obstruction or gangrene: Secondary | ICD-10-CM | POA: Diagnosis not present

## 2023-03-22 DIAGNOSIS — Z6832 Body mass index (BMI) 32.0-32.9, adult: Secondary | ICD-10-CM | POA: Insufficient documentation

## 2023-03-22 DIAGNOSIS — I080 Rheumatic disorders of both mitral and aortic valves: Secondary | ICD-10-CM | POA: Insufficient documentation

## 2023-03-22 DIAGNOSIS — K621 Rectal polyp: Secondary | ICD-10-CM | POA: Diagnosis not present

## 2023-03-22 DIAGNOSIS — Z8673 Personal history of transient ischemic attack (TIA), and cerebral infarction without residual deficits: Secondary | ICD-10-CM | POA: Insufficient documentation

## 2023-03-22 DIAGNOSIS — K644 Residual hemorrhoidal skin tags: Secondary | ICD-10-CM | POA: Diagnosis not present

## 2023-03-22 DIAGNOSIS — K2270 Barrett's esophagus without dysplasia: Secondary | ICD-10-CM | POA: Insufficient documentation

## 2023-03-22 DIAGNOSIS — R634 Abnormal weight loss: Secondary | ICD-10-CM | POA: Diagnosis not present

## 2023-03-22 DIAGNOSIS — I739 Peripheral vascular disease, unspecified: Secondary | ICD-10-CM | POA: Diagnosis not present

## 2023-03-22 DIAGNOSIS — Z800 Family history of malignant neoplasm of digestive organs: Secondary | ICD-10-CM | POA: Insufficient documentation

## 2023-03-22 DIAGNOSIS — K2970 Gastritis, unspecified, without bleeding: Secondary | ICD-10-CM | POA: Diagnosis not present

## 2023-03-22 DIAGNOSIS — K208 Other esophagitis without bleeding: Secondary | ICD-10-CM | POA: Diagnosis not present

## 2023-03-22 DIAGNOSIS — D128 Benign neoplasm of rectum: Secondary | ICD-10-CM | POA: Insufficient documentation

## 2023-03-22 DIAGNOSIS — Z951 Presence of aortocoronary bypass graft: Secondary | ICD-10-CM | POA: Insufficient documentation

## 2023-03-22 DIAGNOSIS — I2510 Atherosclerotic heart disease of native coronary artery without angina pectoris: Secondary | ICD-10-CM | POA: Diagnosis not present

## 2023-03-22 DIAGNOSIS — Z953 Presence of xenogenic heart valve: Secondary | ICD-10-CM | POA: Diagnosis not present

## 2023-03-22 DIAGNOSIS — Z7901 Long term (current) use of anticoagulants: Secondary | ICD-10-CM | POA: Insufficient documentation

## 2023-03-22 DIAGNOSIS — K5730 Diverticulosis of large intestine without perforation or abscess without bleeding: Secondary | ICD-10-CM | POA: Diagnosis not present

## 2023-03-22 DIAGNOSIS — I10 Essential (primary) hypertension: Secondary | ICD-10-CM | POA: Diagnosis not present

## 2023-03-22 DIAGNOSIS — Z792 Long term (current) use of antibiotics: Secondary | ICD-10-CM | POA: Insufficient documentation

## 2023-03-22 DIAGNOSIS — K219 Gastro-esophageal reflux disease without esophagitis: Secondary | ICD-10-CM | POA: Insufficient documentation

## 2023-03-22 DIAGNOSIS — K635 Polyp of colon: Secondary | ICD-10-CM | POA: Diagnosis not present

## 2023-03-22 DIAGNOSIS — D124 Benign neoplasm of descending colon: Secondary | ICD-10-CM | POA: Insufficient documentation

## 2023-03-22 DIAGNOSIS — I252 Old myocardial infarction: Secondary | ICD-10-CM | POA: Diagnosis not present

## 2023-03-22 DIAGNOSIS — Z950 Presence of cardiac pacemaker: Secondary | ICD-10-CM | POA: Insufficient documentation

## 2023-03-22 DIAGNOSIS — Z87891 Personal history of nicotine dependence: Secondary | ICD-10-CM | POA: Insufficient documentation

## 2023-03-22 HISTORY — DX: Cardiac arrhythmia, unspecified: I49.9

## 2023-03-22 HISTORY — PX: POLYPECTOMY: SHX5525

## 2023-03-22 HISTORY — PX: ESOPHAGOGASTRODUODENOSCOPY (EGD) WITH PROPOFOL: SHX5813

## 2023-03-22 HISTORY — PX: COLONOSCOPY WITH PROPOFOL: SHX5780

## 2023-03-22 HISTORY — DX: Cerebral infarction, unspecified: I63.9

## 2023-03-22 HISTORY — PX: BIOPSY: SHX5522

## 2023-03-22 LAB — GLUCOSE, CAPILLARY: Glucose-Capillary: 145 mg/dL — ABNORMAL HIGH (ref 70–99)

## 2023-03-22 SURGERY — COLONOSCOPY WITH PROPOFOL
Anesthesia: Monitor Anesthesia Care

## 2023-03-22 MED ORDER — FENTANYL CITRATE (PF) 100 MCG/2ML IJ SOLN
INTRAMUSCULAR | Status: AC
  Filled 2023-03-22: qty 2

## 2023-03-22 MED ORDER — PROPOFOL 500 MG/50ML IV EMUL
INTRAVENOUS | Status: DC | PRN
  Administered 2023-03-22: 110 ug/kg/min via INTRAVENOUS
  Administered 2023-03-22: 20 mg via INTRAVENOUS

## 2023-03-22 MED ORDER — LIDOCAINE 2% (20 MG/ML) 5 ML SYRINGE
INTRAMUSCULAR | Status: DC | PRN
  Administered 2023-03-22: 50 mg via INTRAVENOUS

## 2023-03-22 MED ORDER — SODIUM CHLORIDE 0.9 % IV SOLN: INTRAVENOUS | Status: DC | PRN

## 2023-03-22 MED ORDER — OMEPRAZOLE MAGNESIUM 20 MG PO TBEC: 20.0000 mg | DELAYED_RELEASE_TABLET | Freq: Every day | ORAL | 3 refills | Status: DC

## 2023-03-22 MED ORDER — PROPOFOL 1000 MG/100ML IV EMUL
INTRAVENOUS | Status: AC
  Filled 2023-03-22: qty 100

## 2023-03-22 SURGICAL SUPPLY — 23 items
BLOCK BITE 60FR ADLT L/F BLUE (MISCELLANEOUS) ×2 IMPLANT
ELECT REM PT RETURN 9FT ADLT (ELECTROSURGICAL)
ELECTRODE REM PT RTRN 9FT ADLT (ELECTROSURGICAL) IMPLANT
FLOOR PAD 36X40 (MISCELLANEOUS) ×2
FORCEP RJ3 GP 1.8X160 W-NEEDLE (CUTTING FORCEPS) IMPLANT
FORCEPS BIOP RAD 4 LRG CAP 4 (CUTTING FORCEPS) IMPLANT
FORCEPS BIOP RJ4 240 W/NDL (CUTTING FORCEPS)
FORCEPS BXJMBJMB 240X2.8X (CUTTING FORCEPS) IMPLANT
INJECTOR/SNARE I SNARE (MISCELLANEOUS) IMPLANT
LUBRICANT JELLY 4.5OZ STERILE (MISCELLANEOUS) IMPLANT
MANIFOLD NEPTUNE II (INSTRUMENTS) IMPLANT
NDL SCLEROTHERAPY 25GX240 (NEEDLE) IMPLANT
NEEDLE SCLEROTHERAPY 25GX240 (NEEDLE)
PAD FLOOR 36X40 (MISCELLANEOUS) ×2 IMPLANT
PROBE APC STR FIRE (PROBE) IMPLANT
PROBE INJECTION GOLD 7FR (MISCELLANEOUS) IMPLANT
SNARE ROTATE MED OVAL 20MM (MISCELLANEOUS) IMPLANT
SNARE SHORT THROW 13M SML OVAL (MISCELLANEOUS) IMPLANT
SYR 50ML LL SCALE MARK (SYRINGE) IMPLANT
TRAP SPECIMEN MUCOUS 40CC (MISCELLANEOUS) IMPLANT
TUBING ENDO SMARTCAP PENTAX (MISCELLANEOUS) ×4 IMPLANT
TUBING IRRIGATION ENDOGATOR (MISCELLANEOUS) ×2 IMPLANT
WATER STERILE IRR 1000ML POUR (IV SOLUTION) IMPLANT

## 2023-03-22 NOTE — H&P (Signed)
Primary Care Physician:  [name], MD Primary Gastroenterologist:  Dr. Levora Angel  Reason for Visit -outpatient EGD and colonoscopy  HPI: [name] is a 73 y.o. male here for outpatient EGD and colonoscopy for evaluation of GERD, weight loss and family history of colon cancer.  Patient with past medical history of coronary artery disease status post CABG x 2 with left atrial appendage clipping and PFO closure, severe aortic stenosis and mitral stenosis status post bioprostheticAVR and MVR, bioprosthetic mitral valve endocarditis , hyperlipidemia, complete heart block status post permanent pacemaker, history of atrial flutter on Eliquis.  He was admitted to the hospital in May 2024 with endocarditis and bacteremia as well as acute CVA.  He was found to have a faecalis bacteremia and colonoscopy was recommended for further evaluation.  He is currently on amoxicillin.  CT abdomen pelvis with IV contrast in March 2024 showed no acute GI changes, moderate-sized hiatal hernia and small umbilical hernia.  Patient is complaining of 40 pound weight since February 2024.  Complaining of occasional central abdominal discomfort and nausea after heavy meal.  He had some constipation in the past which has resolved now. Currently having regular bowel movements. Denies any blood in the stool.  Has been having dark stools since taking iron supplements.  Intermittent acid reflux which gets better with Tums. Denies trouble swallowing or pain while swallowing.  Mother was diagnosed with colon cancer in her 37s.  Personal history of adenomatous polyp during colonoscopy in 2010. Last colonoscopy in 2015 was normal.   Past Medical History:  Diagnosis Date   BCE (basal cell epithelioma), arm    RIGHT SHOULDER   Bradycardia    Bumps on skin    on left side of nose for last 3 weeks   CAD (coronary artery disease)    a. s/p CABGx2 10/11/18 LIMA to LAD, SVG to RCA, EVH via right thigh.   Dyslipidemia     Dysrhythmia    aflutter/afib   Ectatic thoracic aorta (HCC)    Esophageal stricture    GERD (gastroesophageal reflux disease)    Heart murmur    sees dr Rose Fillers   Hyperlipidemia    Incidental pulmonary nodule, > 3mm and < 8mm 10/04/2018   Noted on CTA   Inguinal hernia    bilateral   Obesity    Presence of permanent cardiac pacemaker    Prostate cancer (HCC)    S/P aortic valve replacement with bioprosthetic valve 10/11/2018   23 mm Edwards Inspiris Resilia stented bovine pericardial tissue valve, miltral valve done also   S/P CABG x 2 10/11/2018   LIMA to LAD, SVG to RCA, EVH via right thigh   S/P mitral valve replacement with bioprosthetic valve 10/11/2018   27 mm Medtronic Mosaic stented porcine bioprosthetic tissue valve   S/P patent foramen ovale closure 10/11/2018   S/P placement of cardiac pacemaker    Smoker    Stroke [facility])     Past Surgical History:  Procedure Laterality Date   AORTIC VALVE REPLACEMENT N/A 10/11/2018   Procedure: AORTIC VALVE REPLACEMENT (AVR) with 23 Inspiris Bioprosthetic Aortic valve.;  Surgeon: Purcell Nails, MD;  Location: [facility] OR;  Service: Open Heart Surgery;  Laterality: N/A;   AV FISTULA PLACEMENT Left 06/15/2022   Procedure: LEFT UPPER EXTREMITY THROMBECTOMY;  Surgeon: Maeola Harman, MD;  Location: [facility] OR;  Service: Vascular;  Laterality: Left;   CLIPPING OF ATRIAL APPENDAGE N/A 10/11/2018   Procedure: CLIPPING OF LEFT ATRIAL APPENDAGE with  45 AtriCure Clip.;  Surgeon: Purcell Nails, MD;  Location: [facility] OR;  Service: Open Heart Surgery;  Laterality: N/A;   COLONOSCOPY  03/2008,2004   Dr. Madilyn Fireman   CORONARY ARTERY BYPASS GRAFT N/A 10/11/2018   Procedure: CORONARY ARTERY BYPASS GRAFTING (CABG) x 2 using LIMA to the LAD and endoscopic greater saphenous vein harvesting to the RCA.;  Surgeon: Purcell Nails, MD;  Location: [facility] OR;  Service: Open Heart Surgery;  Laterality: N/A;   CYSTOSCOPY  04/04/2019   Procedure: CYSTOSCOPY;  Surgeon:  Rene Paci, MD;  Location: [facility];  Service: Urology;;   HERNIA REPAIR  12/11/10   BIH   MITRAL VALVE REPLACEMENT N/A 10/11/2018   Procedure: MITRAL VALVE (MV) REPLACEMENT with 27 Mosaic Bioprosthetic Mitral valve.;  Surgeon: Purcell Nails, MD;  Location: [facility] OR;  Service: Open Heart Surgery;  Laterality: N/A;   PACEMAKER IMPLANT N/A 10/17/2018   Procedure: PACEMAKER IMPLANT;  Surgeon: Hillis Range, MD;  Location: [facility] INVASIVE CV LAB;  Service: Cardiovascular;  Laterality: N/A;   RADIOACTIVE SEED IMPLANT N/A 04/04/2019   Procedure: RADIOACTIVE SEED IMPLANT/BRACHYTHERAPY IMPLANT, cystoscopy;  Surgeon: Rene Paci, MD;  Location: [facility];  Service: Urology;  Laterality: N/A;  58 seeds   REPAIR OF PATENT FORAMEN OVALE N/A 10/11/2018   Procedure: CLOSURE OF PATENT FORAMEN OVALE;  Surgeon: Purcell Nails, MD;  Location: [facility] OR;  Service: Open Heart Surgery;  Laterality: N/A;   RIGHT/LEFT HEART CATH AND CORONARY ANGIOGRAPHY N/A 09/15/2018   Procedure: RIGHT/LEFT HEART CATH AND CORONARY ANGIOGRAPHY;  Surgeon: Kathleene Hazel, MD;  Location: [facility] INVASIVE CV LAB;  Service: Cardiovascular;  Laterality: N/A;   RIGHT/LEFT HEART CATH AND CORONARY/GRAFT ANGIOGRAPHY N/A 05/25/2022   Procedure: RIGHT/LEFT HEART CATH AND CORONARY/GRAFT ANGIOGRAPHY;  Surgeon: Kathleene Hazel, MD;  Location: [facility] INVASIVE CV LAB;  Service: Cardiovascular;  Laterality: N/A;   TEE WITHOUT CARDIOVERSION N/A 12/12/2015   Procedure: TRANSESOPHAGEAL ECHOCARDIOGRAM (TEE);  Surgeon: Thurmon Fair, MD;  Location: [facility] ENDOSCOPY;  Service: Cardiovascular;  Laterality: N/A;   TEE WITHOUT CARDIOVERSION N/A 10/11/2018   Procedure: TRANSESOPHAGEAL ECHOCARDIOGRAM (TEE);  Surgeon: Purcell Nails, MD;  Location: [facility] OR;  Service: Open Heart Surgery;  Laterality: N/A;   TEE WITHOUT CARDIOVERSION N/A 06/16/2022   Procedure: TRANSESOPHAGEAL ECHOCARDIOGRAM;  Surgeon: Dorthula Nettles,  DO;  Location: [facility] INVASIVE CV LAB;  Service: Cardiovascular;  Laterality: N/A;   THYMECTOMY  1976   radaiation tx done    Prior to Admission medications   Medication Sig Start Date End Date Taking? Authorizing Provider  acetaminophen (TYLENOL) 325 MG tablet Take 2 tablets (650 mg total) by mouth every 4 (four) hours as needed for moderate pain. 06/22/22  Yes Lewie Chamber, MD  amoxicillin (AMOXIL) 500 MG capsule TAKE 1 CAPSULE BY MOUTH THREE TIMES A DAY 03/21/23  Yes Danelle Earthly, MD  apixaban (ELIQUIS) 5 MG TABS tablet TAKE 1 TABLET BY MOUTH TWICE DAILY 08/02/22  Yes Sabharwal, Aditya, DO  atorvastatin (LIPITOR) 20 MG tablet Take 1 tablet (20 mg total) by mouth daily. 05/18/22  Yes [name], MD  clotrimazole (LOTRIMIN) 1 % cream Apply 1 Application topically 2 (two) times daily. 07/22/22  Yes Danelle Earthly, MD  digoxin (LANOXIN) 0.125 MG tablet TAKE 1 TABLET BY MOUTH EVERY DAY 03/02/23  Yes Sabharwal, Aditya, DO  empagliflozin (JARDIANCE) 10 MG TABS tablet Take 1 tablet (10 mg total) by mouth daily before breakfast. 06/03/22  Yes Sabharwal, Aditya, DO  ferrous sulfate  325 (65 FE) MG tablet Take 325 mg by mouth daily with breakfast.   Yes [provider]  furosemide (LASIX) 20 MG tablet Take 1 tablet (20 mg total) by mouth daily. 07/19/22  Yes Sabharwal, Aditya, DO  metoprolol succinate (TOPROL-XL) 100 MG 24 hr tablet Take 1 tablet (100 mg total) by mouth daily. Take with or immediately following a meal. 11/19/22 03/22/23 Yes Sabharwal, Aditya, DO  spironolactone (ALDACTONE) 25 MG tablet Take 1 tablet (25 mg total) by mouth daily. 08/13/22  Yes Sabharwal, Aditya, DO  oxymetazoline (AFRIN) 0.05 % nasal spray Place 1 spray into both nostrils at bedtime as needed for congestion.    [provider]  polyethylene glycol powder (GLYCOLAX/MIRALAX) 17 GM/SCOOP powder Take 17 g by mouth daily as needed for mild constipation. Patient not taking: Reported on 02/08/2023 05/26/22   Kathlen Mody, MD  triamcinolone cream (KENALOG) 0.1 % Apply 1 Application topically 2 (two) times daily. Patient not taking: Reported on 02/08/2023    [provider]    Scheduled Meds: Continuous Infusions: PRN Meds:.  Allergies as of 02/08/2023 - Review Complete 02/08/2023  Allergen Reaction Noted   Losartan Rash 01/04/2023    Family History  Problem Relation Age of Onset   Colon cancer Mother    Lung cancer Father        smoker   CAD Neg Hx    Pancreatic cancer Neg Hx    Breast cancer Neg Hx     Social History   Socioeconomic History   Marital status: Married    Spouse name: Not on file   Number of children: 0   Years of education: Not on file   Highest education level: Not on file  Occupational History   Occupation: Market researcher: [employer]  Tobacco Use   Smoking status: Former    Average packs/day: 0.3 packs/day for 40.6 years (14.0 ttl pk-yrs)    Types: Cigarettes    Start date: 02/08/1978    Passive exposure: Never   Smokeless tobacco: Never  Vaping Use   Vaping status: Never Used  Substance and Sexual Activity   Alcohol use: Yes    Alcohol/week: 1.0 standard drink of alcohol    Types: 1 Cans of beer per week    Comment: rare   Drug use: No   Sexual activity: Yes  Other Topics Concern   Not on file  Social History Narrative   Not on file   Social Drivers of Health   Financial Resource Strain: Low Risk  (01/11/2023)   Overall Financial Resource Strain (CARDIA)    Difficulty of Paying Living Expenses: Not hard at all  Food Insecurity: No Food Insecurity (01/11/2023)   Hunger Vital Sign    Worried About Running Out of Food in the Last Year: Never true    Ran Out of Food in the Last Year: Never true  Transportation Needs: No Transportation Needs (01/11/2023)   PRAPARE - Administrator, Civil Service (Medical): No    Lack of Transportation (Non-Medical): No  Physical Activity: Insufficiently Active (01/11/2023)   Exercise  Vital Sign    Days of Exercise per Week: 3 days    Minutes of Exercise per Session: 20 min  Stress: No Stress Concern Present (05/11/2022)   [employer] of Occupational Health - Occupational Stress Questionnaire    Feeling of Stress : Not at all  Social Connections: Not on file  Intimate Partner Violence: Not At Risk (01/11/2023)  Humiliation, Afraid, Rape, and Kick questionnaire    Fear of Current or Ex-Partner: No    Emotionally Abused: No    Physically Abused: No    Sexually Abused: No    Review of Systems: All negative except as stated above in HPI.  Physical Exam: Vital signs: Vitals:   03/22/23 1011  BP: 134/77  Resp: 14  Temp: 97.9 F (36.6 C)  SpO2: 94%     General:   Alert,  Well-developed, well-nourished, pleasant and cooperative in NAD Lungs:  Clear throughout to auscultation.   No wheezes, crackles, or rhonchi. No acute distress. Heart:  Regular rate and rhythm; no murmurs, clicks, rubs,  or gallops. Abdomen: Soft, nontender, nondistended, bowel sound present, no peritoneal signs Rectal:  Deferred  GI:  Lab Results: No results for input(s): "WBC", "HGB", "HCT", "PLT" in the last 72 hours. BMET No results for input(s): "NA", "K", "CL", "CO2", "GLUCOSE", "BUN", "CREATININE", "CALCIUM" in the last 72 hours. LFT No results for input(s): "PROT", "ALBUMIN", "AST", "ALT", "ALKPHOS", "BILITOT", "BILIDIR", "IBILI" in the last 72 hours. PT/INR No results for input(s): "LABPROT", "INR" in the last 72 hours.   Studies/Results: No results found.  Impression/Plan: -Weight loss -GERD -Family history of colon cancer -Personal history of colon polyps -Anticoagulation use -Eliquis on HOLD FOR 2 DAYS  Recommendations --------------------------- -Proceed with EGD and colonoscopy today.  Risks (bleeding, infection, bowel perforation that could require surgery, sedation-related changes in cardiopulmonary systems), benefits (identification and possible treatment of  source of symptoms, exclusion of certain causes of symptoms), and alternatives (watchful waiting, radiographic imaging studies, empiric medical treatment)  were explained to patient/family in detail and patient wishes to proceed.     LOS: 0 days   Kathi Der  MD, FACP 03/22/2023, 10:39 AM  Contact #  [phone]

## 2023-03-22 NOTE — Discharge Instructions (Signed)

## 2023-03-22 NOTE — Anesthesia Postprocedure Evaluation (Signed)
Anesthesia Post Note  Patient: [name]  Procedure(s) Performed: COLONOSCOPY WITH PROPOFOL ESOPHAGOGASTRODUODENOSCOPY (EGD) WITH PROPOFOL BIOPSY POLYPECTOMY     Patient location during evaluation: PACU Anesthesia Type: MAC Level of consciousness: awake and alert Pain management: pain level controlled Vital Signs Assessment: post-procedure vital signs reviewed and stable Respiratory status: spontaneous breathing, nonlabored ventilation, respiratory function stable and patient connected to nasal cannula oxygen Cardiovascular status: stable and blood pressure returned to baseline Postop Assessment: no apparent nausea or vomiting Anesthetic complications: no  No notable events documented.  Last Vitals:  Vitals:   03/22/23 1220 03/22/23 1222  BP:  116/71  Pulse: 79 82  Resp: 18 12  Temp:    SpO2: 94% 95%    Last Pain:  Vitals:   03/22/23 1222  TempSrc:   PainSc: 0-No pain                 Kennieth Rad

## 2023-03-22 NOTE — Anesthesia Procedure Notes (Addendum)
Procedure Name: MAC Date/Time: 03/22/2023 11:12 AM  Performed by: Elisabeth Cara, CRNAPre-anesthesia Checklist: Patient identified, Emergency Drugs available, Suction available, Patient being monitored and Timeout performed Patient Re-evaluated:Patient Re-evaluated prior to induction Oxygen Delivery Method: Simple face mask Placement Confirmation: positive ETCO2 Dental Injury: Teeth and Oropharynx as per pre-operative assessment

## 2023-03-22 NOTE — Op Note (Signed)
[facility] Patient Name: [name] Procedure Date: 03/22/2023 MRN: [id number] Attending MD: Kathi Der , MD, [id number] Date of Birth: 1950-09-19 CSN: [id number] Age: 73 Admit Type: Inpatient Procedure:                Colonoscopy Indications:              Weight loss Providers:                Kathi Der, MD, Suzy Bouchard, RN, Geoffery Lyons, Technician Referring MD:             Kathi Der, MD Medicines:                Sedation Administered by an Anesthesia Professional Complications:            No immediate complications. Estimated Blood Loss:     Estimated blood loss was minimal. Procedure:                Pre-Anesthesia Assessment:                           - Prior to the procedure, a History and Physical                            was performed, and patient medications and                            allergies were reviewed. The patient's tolerance of                            previous anesthesia was also reviewed. The risks                            and benefits of the procedure and the sedation                            options and risks were discussed with the patient.                            All questions were answered, and informed consent                            was obtained. Prior Anticoagulants: The patient has                            taken Eliquis (apixaban), last dose was 2 days                            prior to procedure. ASA Grade Assessment: III - A                            patient with severe systemic disease. After  reviewing the risks and benefits, the patient was                            deemed in satisfactory condition to undergo the                            procedure.                           After obtaining informed consent, the colonoscope                            was passed under direct vision. Throughout the                            procedure, the  patient's blood pressure, pulse, and                            oxygen saturations were monitored continuously. The                            PCF-HQ190L ([id number]) Olympus colonoscope was                            introduced through the anus and advanced to the the                            terminal ileum, with identification of the                            appendiceal orifice and IC valve. The colonoscopy                            was performed without difficulty. The patient                            tolerated the procedure well. The quality of the                            bowel preparation was adequate to identify polyps                            greater than 5 mm in size. The terminal ileum,                            ileocecal valve, appendiceal orifice, and rectum                            were photographed. Scope In: Scope Out: Findings:      Skin tags were found on perianal exam.      The terminal ileum appeared normal.      Three sessile polyps were found in the rectum, descending colon and       ascending colon. The polyps were 5 to 7 mm in size. These  polyps were       removed with a cold snare. Resection and retrieval were complete.      A few diverticula were found in the sigmoid colon.      Internal hemorrhoids were found during retroflexion. The hemorrhoids       were small. Impression:               - Perianal skin tags found on perianal exam.                           - The examined portion of the ileum was normal.                           - Three 5 to 7 mm polyps in the rectum, in the                            descending colon and in the ascending colon,                            removed with a cold snare. Resected and retrieved.                           - Diverticulosis in the sigmoid colon.                           - Internal hemorrhoids. Moderate Sedation:      Moderate (conscious) sedation was personally administered by an       anesthesia  professional. The following parameters were monitored: oxygen       saturation, heart rate, blood pressure, and response to care. Recommendation:           - Patient has a contact number available for                            emergencies. The signs and symptoms of potential                            delayed complications were discussed with the                            patient. Return to normal activities tomorrow.                            Written discharge instructions were provided to the                            patient.                           - Resume previous diet.                           - Continue present medications.                           - Await pathology results.                           -  Repeat colonoscopy date to be determined after                            pending pathology results are reviewed for                            surveillance based on pathology results.                           - Resume Eliquis (apixaban) at prior dose in 2 days. Procedure Code(s):        --- Professional ---                           [id number], Colonoscopy, flexible; with removal of                            tumor(s), polyp(s), or other lesion(s) by snare                            technique Diagnosis Code(s):        --- Professional ---                           K64.8, Other hemorrhoids                           D12.8, Benign neoplasm of rectum                           D12.4, Benign neoplasm of descending colon                           D12.2, Benign neoplasm of ascending colon                           K64.4, Residual hemorrhoidal skin tags                           R63.4, Abnormal weight loss                           K57.30, Diverticulosis of large intestine without                            perforation or abscess without bleeding CPT copyright 2022 American Medical Association. All rights reserved. The codes documented in this report are preliminary and upon coder  review may  be revised to meet current compliance requirements. Kathi Der, MD Kathi Der, MD 03/22/2023 12:16:10 PM Number of Addenda: 0

## 2023-03-22 NOTE — Op Note (Signed)
[facility] Patient Name: [name] Procedure Date: 03/22/2023 MRN: [id number] Attending MD: Kathi Der , MD, [id number] Date of Birth: January 23, 1951 CSN: [id number] Age: 73 Admit Type: Outpatient Procedure:                Upper GI endoscopy Indications:              Heartburn, Weight loss Providers:                Kathi Der, MD, Suzy Bouchard, RN, Geoffery Lyons, Technician Referring MD:             Kathi Der, MD Medicines:                Sedation Administered by an Anesthesia Professional Complications:            No immediate complications. Estimated Blood Loss:     Estimated blood loss was minimal. Procedure:                Pre-Anesthesia Assessment:                           - Prior to the procedure, a History and Physical                            was performed, and patient medications and                            allergies were reviewed. The patient's tolerance of                            previous anesthesia was also reviewed. The risks                            and benefits of the procedure and the sedation                            options and risks were discussed with the patient.                            All questions were answered, and informed consent                            was obtained. Prior Anticoagulants: The patient has                            taken Eliquis (apixaban), last dose was 2 days                            prior to procedure. ASA Grade Assessment: III - A                            patient with severe systemic disease. After  reviewing the risks and benefits, the patient was                            deemed in satisfactory condition to undergo the                            procedure.                           After obtaining informed consent, the endoscope was                            passed under direct vision. Throughout the                             procedure, the patient's blood pressure, pulse, and                            oxygen saturations were monitored continuously. The                            [id number]-[id number] ([id number] Olympus endoscope was introduced                            through the mouth, and advanced to the second part                            of duodenum. The upper GI endoscopy was                            accomplished without difficulty. The patient                            tolerated the procedure well. Scope In: 11:33:34 AM Scope Out: 11:48:07 AM Scope Withdrawal Time: 0 hours 11 minutes 0 seconds  Total Procedure Duration: 0 hours 14 minutes 33 seconds  Findings:      There were esophageal mucosal changes classified as Barrett's stage       C10-M10 per Prague criteria present in the lower third of the esophagus.       The maximum longitudinal extent of these mucosal changes was 10 cm in       length. Mucosa was biopsied with a cold forceps for histology in 4       quadrants at intervals of 2 cm. The following biopsy specimens were sent       to pathology: biopsy at 27 cm from the incisors (1st Bottle), biopsy at       29 cm from the incisors (1st Bottle), biopsy at 31 cm from the incisors       (1st Bottle), biopsy at 33 cm from the incisors (1st Bottle) and biopsy       at 35 cm from the incisors (1st Bottle). A total of 5 specimen bottles       were sent to pathology.      A 5 cm hiatal hernia was present.      Patchy mildly erythematous mucosa without bleeding was found in the  entire examined stomach. Biopsies were taken with a cold forceps for       histology.      The cardia and gastric fundus were normal on retroflexion.      The duodenal bulb, first portion of the duodenum and second portion of       the duodenum were normal. Impression:               - Esophageal mucosal changes classified as                            Barrett's stage C10-M10 per Prague criteria.                             Biopsied.                           - 5 cm hiatal hernia.                           - Erythematous mucosa in the stomach. Biopsied.                           - Normal duodenal bulb, first portion of the                            duodenum and second portion of the duodenum. Moderate Sedation:      Moderate (conscious) sedation was personally administered by an       anesthesia professional. The following parameters were monitored: oxygen       saturation, heart rate, blood pressure, and response to care. Recommendation:           - Perform a colonoscopy today.                           - Use Prilosec (omeprazole) 20 mg PO daily. Procedure Code(s):        --- Professional ---                           [phone], Esophagogastroduodenoscopy, flexible,                            transoral; with biopsy, single or multiple Diagnosis Code(s):        --- Professional ---                           K22.70, Barrett's esophagus without dysplasia                           K44.9, Diaphragmatic hernia without obstruction or                            gangrene                           K31.89, Other diseases of stomach and duodenum  R12, Heartburn                           R63.4, Abnormal weight loss CPT copyright 2022 [employer]. All rights reserved. The codes documented in this report are preliminary and upon coder review may  be revised to meet current compliance requirements. Kathi Der, MD Kathi Der, MD 03/22/2023 12:14:24 PM Number of Addenda: 0

## 2023-03-22 NOTE — Anesthesia Preprocedure Evaluation (Signed)
Anesthesia Evaluation  Patient identified by MRN, date of birth, ID band Patient awake    Reviewed: Allergy & Precautions, NPO status , Patient's Chart, lab work & pertinent test results  Airway Mallampati: II  TM Distance: >3 FB Neck ROM: Full    Dental  (+) Dental Advisory Given   Pulmonary former smoker   breath sounds clear to auscultation       Cardiovascular hypertension, Pt. on medications and Pt. on home beta blockers + CAD, + Past MI and + Peripheral Vascular Disease  + dysrhythmias + pacemaker + Valvular Problems/Murmurs  Rhythm:Regular Rate:Normal     Neuro/Psych CVA    GI/Hepatic Neg liver ROS,GERD  ,,  Endo/Other  negative endocrine ROS    Renal/GU negative Renal ROS     Musculoskeletal   Abdominal   Peds  Hematology negative hematology ROS (+)   Anesthesia Other Findings   Reproductive/Obstetrics                             02/01/23 Echo  1. Left ventricular ejection fraction, by estimation, is 60 to 65%. The  left ventricle has normal function. The left ventricle has no regional  wall motion abnormalities. There is severe left ventricular hypertrophy.  Left ventricular diastolic parameters   are indeterminate. There is the interventricular septum is flattened in  systole and diastole, consistent with right ventricular pressure and  volume overload.   2. Right ventricular systolic function is moderately reduced. The right  ventricular size is severely enlarged.   3. Right atrial size was moderately dilated.   4. Pressure half time < 130 ms, TDI 2, iEOA 1.1 cm2/m2. This suggests a  component of patient prosthetis mismatch. The mitral valve has been  repaired/replaced. No evidence of mitral valve regurgitation. The mean  mitral valve gradient is 8.0 mmHg.   5. Tricuspid valve regurgitation is severe.   6. The aortic valve has been repaired/replaced. Aortic valve   regurgitation is not visualized. Echo findings are consistent with normal  structure and function of the aortic valve prosthesis. Aortic valve mean  gradient measures 11.0 mmHg. Aortic valve  acceleration time measures 74 msec.  Anesthesia Physical Anesthesia Plan  ASA: 3  Anesthesia Plan: MAC   Post-op Pain Management:    Induction:   PONV Risk Score and Plan: 1 and Propofol infusion and Treatment may vary due to age or medical condition  Airway Management Planned: Natural Airway, Simple Face Mask and Nasal Cannula  Additional Equipment:   Intra-op Plan:   Post-operative Plan:   Informed Consent: I have reviewed the patients History and Physical, chart, labs and discussed the procedure including the risks, benefits and alternatives for the proposed anesthesia with the patient or authorized representative who has indicated his/her understanding and acceptance.       Plan Discussed with: CRNA  Anesthesia Plan Comments:          Anesthesia Quick Evaluation

## 2023-03-22 NOTE — Transfer of Care (Signed)
Immediate Anesthesia Transfer of Care Note  Patient: [name]  Procedure(s) Performed: COLONOSCOPY WITH PROPOFOL ESOPHAGOGASTRODUODENOSCOPY (EGD) WITH PROPOFOL BIOPSY POLYPECTOMY  Patient Location: PACU and Endoscopy Unit  Anesthesia Type:MAC  Level of Consciousness: awake and sedated  Airway & Oxygen Therapy: Patient Spontanous Breathing  Post-op Assessment: Report given to RN  Post vital signs: stable  Last Vitals:  Vitals Value Taken Time  BP 100/66 03/22/23 1156  Temp 36.2 C 03/22/23 1156  Pulse 84 03/22/23 1158  Resp 18 03/22/23 1158  SpO2 97 % 03/22/23 1158  Vitals shown include unfiled device data.  Last Pain:  Vitals:   03/22/23 1156  TempSrc: Temporal  PainSc: 0-No pain         Complications: No notable events documented.

## 2023-03-23 LAB — SURGICAL PATHOLOGY

## 2023-03-24 ENCOUNTER — Encounter (HOSPITAL_COMMUNITY): Payer: Self-pay | Admitting: Gastroenterology

## 2023-03-24 NOTE — Progress Notes (Signed)
Remote pacemaker transmission.

## 2023-03-24 NOTE — Addendum Note (Signed)
Addended by: Geralyn Flash D on: 03/24/2023 10:21 AM   Modules accepted: Orders

## 2023-04-05 ENCOUNTER — Encounter: Payer: Self-pay | Admitting: Internal Medicine

## 2023-04-09 ENCOUNTER — Encounter (HOSPITAL_BASED_OUTPATIENT_CLINIC_OR_DEPARTMENT_OTHER): Payer: Self-pay | Admitting: Emergency Medicine

## 2023-04-09 ENCOUNTER — Emergency Department (HOSPITAL_BASED_OUTPATIENT_CLINIC_OR_DEPARTMENT_OTHER)

## 2023-04-09 ENCOUNTER — Emergency Department (HOSPITAL_BASED_OUTPATIENT_CLINIC_OR_DEPARTMENT_OTHER)
Admission: EM | Admit: 2023-04-09 | Discharge: 2023-04-09 | Disposition: A | Discharge status: Home / Self Care | Attending: Emergency Medicine | Admitting: Emergency Medicine

## 2023-04-09 DIAGNOSIS — R0789 Other chest pain: Secondary | ICD-10-CM | POA: Diagnosis not present

## 2023-04-09 DIAGNOSIS — I700 Atherosclerosis of aorta: Secondary | ICD-10-CM | POA: Diagnosis not present

## 2023-04-09 DIAGNOSIS — R918 Other nonspecific abnormal finding of lung field: Secondary | ICD-10-CM | POA: Diagnosis not present

## 2023-04-09 DIAGNOSIS — I252 Old myocardial infarction: Secondary | ICD-10-CM | POA: Insufficient documentation

## 2023-04-09 DIAGNOSIS — Z6831 Body mass index (BMI) 31.0-31.9, adult: Secondary | ICD-10-CM | POA: Diagnosis not present

## 2023-04-09 DIAGNOSIS — T148XXA Other injury of unspecified body region, initial encounter: Secondary | ICD-10-CM

## 2023-04-09 DIAGNOSIS — R059 Cough, unspecified: Secondary | ICD-10-CM | POA: Insufficient documentation

## 2023-04-09 DIAGNOSIS — E669 Obesity, unspecified: Secondary | ICD-10-CM | POA: Insufficient documentation

## 2023-04-09 DIAGNOSIS — Z952 Presence of prosthetic heart valve: Secondary | ICD-10-CM | POA: Insufficient documentation

## 2023-04-09 DIAGNOSIS — Z87891 Personal history of nicotine dependence: Secondary | ICD-10-CM | POA: Diagnosis not present

## 2023-04-09 DIAGNOSIS — X58XXXA Exposure to other specified factors, initial encounter: Secondary | ICD-10-CM | POA: Insufficient documentation

## 2023-04-09 DIAGNOSIS — M549 Dorsalgia, unspecified: Secondary | ICD-10-CM | POA: Diagnosis present

## 2023-04-09 DIAGNOSIS — Z7901 Long term (current) use of anticoagulants: Secondary | ICD-10-CM | POA: Insufficient documentation

## 2023-04-09 DIAGNOSIS — Z8546 Personal history of malignant neoplasm of prostate: Secondary | ICD-10-CM | POA: Diagnosis not present

## 2023-04-09 DIAGNOSIS — Z951 Presence of aortocoronary bypass graft: Secondary | ICD-10-CM | POA: Insufficient documentation

## 2023-04-09 DIAGNOSIS — S29012A Strain of muscle and tendon of back wall of thorax, initial encounter: Secondary | ICD-10-CM | POA: Diagnosis not present

## 2023-04-09 DIAGNOSIS — R079 Chest pain, unspecified: Secondary | ICD-10-CM | POA: Diagnosis not present

## 2023-04-09 DIAGNOSIS — I4510 Unspecified right bundle-branch block: Secondary | ICD-10-CM | POA: Insufficient documentation

## 2023-04-09 DIAGNOSIS — R0602 Shortness of breath: Secondary | ICD-10-CM | POA: Insufficient documentation

## 2023-04-09 DIAGNOSIS — R110 Nausea: Secondary | ICD-10-CM | POA: Diagnosis not present

## 2023-04-09 DIAGNOSIS — S29011A Strain of muscle and tendon of front wall of thorax, initial encounter: Secondary | ICD-10-CM | POA: Diagnosis not present

## 2023-04-09 LAB — CBC
HCT: 50.1 % (ref 39.0–52.0)
Hemoglobin: 16.2 g/dL (ref 13.0–17.0)
MCH: 28.6 pg (ref 26.0–34.0)
MCHC: 32.3 g/dL (ref 30.0–36.0)
MCV: 88.5 fL (ref 80.0–100.0)
Platelets: 206 10*3/uL (ref 150–400)
RBC: 5.66 MIL/uL (ref 4.22–5.81)
RDW: 14.4 % (ref 11.5–15.5)
WBC: 9.6 10*3/uL (ref 4.0–10.5)
nRBC: 0 % (ref 0.0–0.2)

## 2023-04-09 LAB — BRAIN NATRIURETIC PEPTIDE: B Natriuretic Peptide: 188.8 pg/mL — ABNORMAL HIGH (ref 0.0–100.0)

## 2023-04-09 LAB — BASIC METABOLIC PANEL
Anion gap: 11 (ref 5–15)
BUN: 17 mg/dL (ref 8–23)
CO2: 25 mmol/L (ref 22–32)
Calcium: 9.3 mg/dL (ref 8.9–10.3)
Chloride: 96 mmol/L — ABNORMAL LOW (ref 98–111)
Creatinine, Ser: 1.37 mg/dL — ABNORMAL HIGH (ref 0.61–1.24)
GFR, Estimated: 55 mL/min — ABNORMAL LOW (ref >60)
Glucose, Bld: 140 mg/dL — ABNORMAL HIGH (ref 70–99)
Potassium: 4.2 mmol/L (ref 3.5–5.1)
Sodium: 132 mmol/L — ABNORMAL LOW (ref 135–145)

## 2023-04-09 LAB — RESP PANEL BY RT-PCR (RSV, FLU A&B, COVID)  RVPGX2
Influenza A by PCR: NEGATIVE
Influenza B by PCR: NEGATIVE
Resp Syncytial Virus by PCR: NEGATIVE
SARS Coronavirus 2 by RT PCR: NEGATIVE

## 2023-04-09 LAB — TROPONIN I (HIGH SENSITIVITY): Troponin I (High Sensitivity): 10 ng/L (ref <18)

## 2023-04-09 NOTE — Discharge Instructions (Signed)
 Your exam is consistent with muscle spasm.  You can take 1000 mg of Tylenol 4 times a day.  You can also take over-the-counter Voltaren gel or IcyHot.  You can try ice heat and gentle stretches.  Please follow-up with your primary care doctor.  Your chest x-ray does show possible mild pleural effusion otherwise your workup was very reassuring here today.

## 2023-04-09 NOTE — ED Triage Notes (Signed)
 Pt c/o back pain between shoulder blades and nausea x 4d; pain increases with movement; denies CP/abd pain

## 2023-04-09 NOTE — ED Provider Notes (Signed)
 [facility] Provider Note   CSN: [id number] Arrival date & time: 04/09/23  1151     History  Chief Complaint  Patient presents with   Back Pain    [name] is a 73 y.o. male with past medical history of echo 2 months ago 60 to 65%, NSTEMI, CABG, atrial flutter, aortic valve replacement, hyperlipidemia, obesity, former smoker, prostate cancer presenting to emergency room with 4 days of back pain.  He reports his back pain is between her shoulder blades and is intermittent in nature.  He reports that his pain is worse when he is coughing or bending down.  He feels he has noted some increased shortness of breath over the past week and he has had cough.  Cough is dry and nonproductive.  He reports recent sick contact. Family reports they are worried that this is his heart. He reports this does not feel like prior cardiac events. He does note some mild nausea but denies any vomiting or diarrhea.  He does not have any abdominal pain, chest pain or lower extremity edema.  He is on Eliquis.  Denies any recent injury trauma or falls.   Back Pain      Home Medications Prior to Admission medications   Medication Sig Start Date End Date Taking? Authorizing Provider  acetaminophen (TYLENOL) 325 MG tablet Take 2 tablets (650 mg total) by mouth every 4 (four) hours as needed for moderate pain. 06/22/22   Lewie Chamber, MD  amoxicillin (AMOXIL) 500 MG capsule TAKE 1 CAPSULE BY MOUTH THREE TIMES A DAY 03/21/23   Danelle Earthly, MD  apixaban (ELIQUIS) 5 MG TABS tablet TAKE 1 TABLET BY MOUTH TWICE DAILY 08/02/22   Sabharwal, Aditya, DO  atorvastatin (LIPITOR) 20 MG tablet Take 1 tablet (20 mg total) by mouth daily. 05/18/22   [name], MD  clotrimazole (LOTRIMIN) 1 % cream Apply 1 Application topically 2 (two) times daily. 07/22/22   Danelle Earthly, MD  digoxin (LANOXIN) 0.125 MG tablet TAKE 1 TABLET BY MOUTH EVERY DAY 03/02/23   Sabharwal, Aditya, DO   empagliflozin (JARDIANCE) 10 MG TABS tablet Take 1 tablet (10 mg total) by mouth daily before breakfast. 06/03/22   Sabharwal, Aditya, DO  ferrous sulfate 325 (65 FE) MG tablet Take 325 mg by mouth daily with breakfast.    [provider]  furosemide (LASIX) 20 MG tablet Take 1 tablet (20 mg total) by mouth daily. 07/19/22   Sabharwal, Aditya, DO  metoprolol succinate (TOPROL-XL) 100 MG 24 hr tablet Take 1 tablet (100 mg total) by mouth daily. Take with or immediately following a meal. 11/19/22 03/22/23  Sabharwal, Aditya, DO  omeprazole (PRILOSEC OTC) 20 MG tablet Take 1 tablet (20 mg total) by mouth daily. 03/22/23 03/21/24  Kathi Der, MD  oxymetazoline (AFRIN) 0.05 % nasal spray Place 1 spray into both nostrils at bedtime as needed for congestion.    [provider]  polyethylene glycol powder (GLYCOLAX/MIRALAX) 17 GM/SCOOP powder Take 17 g by mouth daily as needed for mild constipation. Patient not taking: Reported on 02/08/2023 05/26/22   Kathlen Mody, MD  spironolactone (ALDACTONE) 25 MG tablet Take 1 tablet (25 mg total) by mouth daily. 08/13/22   Sabharwal, Aditya, DO  triamcinolone cream (KENALOG) 0.1 % Apply 1 Application topically 2 (two) times daily. Patient not taking: Reported on 02/08/2023    [provider]      Allergies    Losartan    Review  of Systems   Review of Systems  Musculoskeletal:  Positive for back pain.    Physical Exam Updated Vital Signs BP 128/83 (BP Location: Left Arm)   Pulse 92   Temp 98.1 F (36.7 C)   Resp 16   Ht 5\' 8"  (1.727 m)   Wt 94.3 kg   SpO2 95%   BMI 31.63 kg/m  Physical Exam Vitals and nursing note reviewed.  Constitutional:      General: He is not in acute distress.    Appearance: He is not toxic-appearing.  HENT:     Head: Normocephalic and atraumatic.  Eyes:     General: No scleral icterus.    Conjunctiva/sclera: Conjunctivae normal.  Cardiovascular:     Rate and Rhythm: Normal rate and regular  rhythm.     Pulses: Normal pulses.     Heart sounds: Normal heart sounds.  Pulmonary:     Effort: Pulmonary effort is normal. No respiratory distress.     Breath sounds: Normal breath sounds.  Abdominal:     General: Abdomen is flat. Bowel sounds are normal.     Palpations: Abdomen is soft.     Tenderness: There is no abdominal tenderness.  Musculoskeletal:     Right lower leg: No edema.     Left lower leg: No edema.     Comments: Has tenderness to palpation over bilateral mid back.  No midline tenderness step-off or deformity.  Skin:    General: Skin is warm and dry.     Findings: No lesion.  Neurological:     General: No focal deficit present.     Mental Status: He is alert and oriented to person, place, and time. Mental status is at baseline.     ED Results / Procedures / Treatments   Labs (all labs ordered are listed, but only abnormal results are displayed) Labs Reviewed  BASIC METABOLIC PANEL - Abnormal; Notable for the following components:      Result Value   Sodium 132 (*)    Chloride 96 (*)    Glucose, Bld 140 (*)    Creatinine, Ser 1.37 (*)    GFR, Estimated 55 (*)    All other components within normal limits  BRAIN NATRIURETIC PEPTIDE - Abnormal; Notable for the following components:   B Natriuretic Peptide 188.8 (*)    All other components within normal limits  RESP PANEL BY RT-PCR (RSV, FLU A&B, COVID)  RVPGX2  CBC  TROPONIN I (HIGH SENSITIVITY)    EKG EKG Interpretation Date/Time:  Saturday April 09 2023 12:12:59 EST Ventricular Rate:  89 PR Interval:  176 QRS Duration:  144 QT Interval:  426 QTC Calculation: 519 R Axis:   -57  Text Interpretation: Sinus rhythm Right bundle branch block Probable inferior infarct, recent Anteroseptal infarct, age indeterminate no significant change since 2024 Confirmed by Pricilla Loveless [phone]) on 04/09/2023 12:17:14 PM  Radiology DG Chest 2 View Result Date: 04/09/2023 CLINICAL DATA:  Cough with interscapular  chest pain and nausea for 4 days. EXAM: CHEST - 2 VIEW COMPARISON:  [facility] 05/23/2022 and 05/29/2019.  Chest CT 10/04/2018. FINDINGS: The heart size and mediastinal contours are stable status post median sternotomy, CABG, aortic valve replacement and left atrial appendage clipping. Left subclavian pacemaker leads appear unchanged. There is aortic atherosclerosis. Lower lung volumes with mildly increased subsegmental atelectasis or linear scarring in both lungs. No confluent airspace disease or pneumothorax. There is a possible small right pleural effusion, best seen on the lateral view. The  bones appear unchanged. IMPRESSION: Lower lung volumes with mildly increased subsegmental atelectasis or linear scarring in both lungs. Possible small right pleural effusion. No evidence of pneumonia. Electronically Signed   By: Carey Bullocks M.D.   On: 04/09/2023 14:10    Procedures Procedures    Medications Ordered in ED Medications - No data to display  ED Course/ Medical Decision Making/ A&P                                 Medical Decision Making Amount and/or Complexity of Data Reviewed Labs: ordered. Radiology: ordered.   GUERINO CAPORALE 73 y.o. presented today for chest pain. Working DDx that I considered at this time includes, but not limited to, ACS, GERD, pe, pna, aortic dissection, pneumothorax, MSK path, anemia, esophageal rupture, CHF exacerbation, valvular disorder, myocarditis, pericarditis, endocarditis, pericardial effusion/cardiac tamponade, pulmonary edema, gastritis/PUD, esophagitis.  R/o Dx: PE: advanced imaging will not be ordered as alternative diagnoses are more likely at this time - no sign of dvt/PE and patient is on blood thinner, had not missed dose. Aortic Dissection: less likely based on the history of present illness - location, quality, onset, and severity of symptoms in this case.   Review of prior external notes: Reviewed echo from 02/01/2023  Unique Tests and My  Interpretation:  CBC without leukocytosis no anemia.  BMP with creatinine and GFR which are at baseline for him.  Sodium is 132.  Respiratory panel negative.  Troponin 10.  BNP 188  Chest x-ray shows possible right pleural effusion.  EKG shows right bundle branch block with no significant change since prior recent EKG   Problem List / ED Course / Critical interventions / Medication management  Patient reporting with back pain.  This has been ongoing for 4 days.  It is positional and worse with movement.  It is reproducible on exam.  Given patient's significant cardiac history he came in because he was concerned it was his heart.  Expresses some shortness of breath but it is unclear whether this is worse than his baseline.  He also reports that he has had cough that is not productive and not significantly worse than baseline. Resp panel negative. He has no significant fluid overload on exam and BNP low.  He has no sign of pneumonia or significant chest congestion on x-ray.  His troponin was negative due to duration of symptoms doubt ACS and do not feel he needs to repeat troponin.  He has no significant change in EKG.  Doubt blood clot as cause of pain secondary to him being on blood thinner and no sign of DVT.  Overall he is well-appearing and exam seems consistent with muscular origin.  He reports his symptoms have improved with resting here.  Discussed case with attending provider who agrees patient is stable for discharge.   Patients vitals assessed. Upon arrival patient is hemodynamically stable.  I have reviewed the patients home medicines and have made adjustments as needed    Plan:  F/u w/ PCP to ensure resolution of sx.  Patient was given return precautions. Patient stable for discharge at this time.  Patient educated on sx/ dx and verbalized understanding of plan.  Will return to ER w/ new or worsening sx.          Final Clinical Impression(s) / ED Diagnoses Final diagnoses:   Muscle strain    Rx / DC Orders ED Discharge Orders  None         Smitty Knudsen, PA-C 04/09/23 1527    Pricilla Loveless, MD 04/10/23 [phone]

## 2023-04-11 ENCOUNTER — Telehealth: Payer: Self-pay

## 2023-04-11 NOTE — Transitions of Care (Post Inpatient/ED Visit) (Signed)
 04/11/2023  Name: [name] MRN: [id number] DOB: [birth year]  Today's TOC FU Call Status: Today's TOC FU Call Status:: Successful TOC FU Call Completed TOC FU Call Complete Date: 04/11/23 Patient's Name and Date of Birth confirmed.  Transition Care Management Follow-up Telephone Call Date of Discharge: 04/09/23 Discharge Facility: [facility]) Type of Discharge: Emergency Department Reason for ED Visit: Other: (muscle strain) How have you been since you were released from the hospital?: Better Any questions or concerns?: No  Items Reviewed: Did you receive and understand the discharge instructions provided?: Yes Medications obtained,verified, and reconciled?: Yes (Medications Reviewed) Any new allergies since your discharge?: No Dietary orders reviewed?: NA Do you have support at home?: Yes  Medications Reviewed Today: Medications Reviewed Today     Reviewed by Leigh Aurora, CMA (Certified Medical Assistant) on 04/11/23 at 1531  Med List Status: <None>   Medication Order Taking? Sig Documenting Provider Last Dose Status Informant  acetaminophen (TYLENOL) 325 MG tablet [id number] No Take 2 tablets (650 mg total) by mouth every 4 (four) hours as needed for moderate pain. Lewie Chamber, MD Past Week Active            Med Note Sonia Baller Nov 23, 2022  2:17 PM) 500mg  last night for HA  amoxicillin (AMOXIL) 500 MG capsule [id number] No TAKE 1 CAPSULE BY MOUTH THREE TIMES A Tivis Ringer, MD 03/21/2023 Active   apixaban (ELIQUIS) 5 MG TABS tablet [id number] No TAKE 1 TABLET BY MOUTH TWICE DAILY Sabharwal, Aditya, DO Past Week Active            Med Note Weston Settle R   Tue Mar 22, 2023 10:02 AM) Last dose on Saturday 03/19/23 per pt /bt   atorvastatin (LIPITOR) 20 MG tablet [id number] No Take 1 tablet (20 mg total) by mouth daily. [name], MD Past Week Active Self, Pharmacy Records, Spouse/Significant Other  clotrimazole (LOTRIMIN) 1 % cream  [id number] No Apply 1 Application topically 2 (two) times daily. Danelle Earthly, MD Past Week Active   digoxin (LANOXIN) 0.125 MG tablet [id number] No TAKE 1 TABLET BY MOUTH EVERY DAY Sabharwal, Aditya, DO 03/21/2023 Active   empagliflozin (JARDIANCE) 10 MG TABS tablet [id number] No Take 1 tablet (10 mg total) by mouth daily before breakfast. Sabharwal, Aditya, DO Past Week Active Self, Pharmacy Records, Spouse/Significant Other  ferrous sulfate 325 (65 FE) MG tablet [id number] No Take 325 mg by mouth daily with breakfast. [provider] Past Week Active            Med Note Melonie Florida   Tue Nov 23, 2022  2:17 PM) Just restarted this week  furosemide (LASIX) 20 MG tablet [id number] No Take 1 tablet (20 mg total) by mouth daily. Sabharwal, Aditya, DO Past Week Active   metoprolol succinate (TOPROL-XL) 100 MG 24 hr tablet [id number] No Take 1 tablet (100 mg total) by mouth daily. Take with or immediately following a meal. Sabharwal, Aditya, DO 03/21/2023 Expired 03/22/23 2359   omeprazole (PRILOSEC OTC) 20 MG tablet [id number]  Take 1 tablet (20 mg total) by mouth daily. Kathi Der, MD  Active   oxymetazoline (AFRIN) 0.05 % nasal spray [id number] No Place 1 spray into both nostrils at bedtime as needed for congestion. [provider] Taking Active Self, Pharmacy Records, Spouse/Significant Other           Med Note Sonia Baller Nov 23, 2022  2:20 PM) As  needed  polyethylene glycol powder (GLYCOLAX/MIRALAX) 17 GM/SCOOP powder [id number] No Take 17 g by mouth daily as needed for mild constipation.  Patient not taking: Reported on 02/08/2023   Kathlen Mody, MD More than a month Active Self, Pharmacy Records, Spouse/Significant Other           Med Note Sonia Baller Nov 23, 2022  2:20 PM) As needed, has not used in months  spironolactone (ALDACTONE) 25 MG tablet [id number] No Take 1 tablet (25 mg total) by mouth daily. Dorthula Nettles, DO Past Week Active    triamcinolone cream (KENALOG) 0.1 % [id number] No Apply 1 Application topically 2 (two) times daily.  Patient not taking: Reported on 02/08/2023   [provider] Not Taking Active             Home Care and Equipment/Supplies: Were Home Health Services Ordered?: NA Any new equipment or medical supplies ordered?: NA  Functional Questionnaire: Do you need assistance with bathing/showering or dressing?: No Do you need assistance with meal preparation?: No Do you need assistance with eating?: No Do you have difficulty maintaining continence: No Do you need assistance with getting out of bed/getting out of a chair/moving?: No Do you have difficulty managing or taking your medications?: No  Follow up appointments reviewed: PCP Follow-up appointment confirmed?: NA (pt declined/ will call back if he changes his mind) Specialist Hospital Follow-up appointment confirmed?: NA Do you need transportation to your follow-up appointment?: No Do you understand care options if your condition(s) worsen?: Yes-patient verbalized understanding    SIGNATURE Agnes Lawrence, CMA (AAMA)  [facility] [phone]

## 2023-05-11 DIAGNOSIS — R233 Spontaneous ecchymoses: Secondary | ICD-10-CM | POA: Diagnosis not present

## 2023-05-11 DIAGNOSIS — C44519 Basal cell carcinoma of skin of other part of trunk: Secondary | ICD-10-CM | POA: Diagnosis not present

## 2023-05-16 ENCOUNTER — Ambulatory Visit (INDEPENDENT_AMBULATORY_CARE_PROVIDER_SITE_OTHER): Payer: Medicare HMO

## 2023-05-16 DIAGNOSIS — I442 Atrioventricular block, complete: Secondary | ICD-10-CM

## 2023-05-16 LAB — CUP PACEART REMOTE DEVICE CHECK
Battery Remaining Longevity: 59 mo
Battery Voltage: 2.96 V
Brady Statistic AP VP Percent: 0.37 %
Brady Statistic AP VS Percent: 0.01 %
Brady Statistic AS VP Percent: 90.25 %
Brady Statistic AS VS Percent: 9.38 %
Brady Statistic RA Percent Paced: 0.5 %
Brady Statistic RV Percent Paced: 90.62 %
Date Time Interrogation Session: 20250407001635
Implantable Lead Connection Status: 753985
Implantable Lead Connection Status: 753985
Implantable Lead Implant Date: 20200908
Implantable Lead Implant Date: 20200908
Implantable Lead Location: 753859
Implantable Lead Location: 753860
Implantable Lead Model: 3830
Implantable Lead Model: 5076
Implantable Pulse Generator Implant Date: 20200908
Lead Channel Impedance Value: 304 Ohm
Lead Channel Impedance Value: 342 Ohm
Lead Channel Impedance Value: 399 Ohm
Lead Channel Impedance Value: 475 Ohm
Lead Channel Pacing Threshold Amplitude: 0.625 V
Lead Channel Pacing Threshold Amplitude: 1 V
Lead Channel Pacing Threshold Pulse Width: 0.4 ms
Lead Channel Pacing Threshold Pulse Width: 0.4 ms
Lead Channel Sensing Intrinsic Amplitude: 1.25 mV
Lead Channel Sensing Intrinsic Amplitude: 1.25 mV
Lead Channel Sensing Intrinsic Amplitude: 13.75 mV
Lead Channel Sensing Intrinsic Amplitude: 13.75 mV
Lead Channel Setting Pacing Amplitude: 1.5 V
Lead Channel Setting Pacing Amplitude: 2.5 V
Lead Channel Setting Pacing Pulse Width: 0.8 ms
Lead Channel Setting Sensing Sensitivity: 1.2 mV
Zone Setting Status: 755011
Zone Setting Status: 755011

## 2023-05-21 ENCOUNTER — Encounter: Payer: Self-pay | Admitting: Cardiology

## 2023-05-23 ENCOUNTER — Other Ambulatory Visit (HOSPITAL_COMMUNITY): Payer: Self-pay | Admitting: Cardiology

## 2023-05-25 ENCOUNTER — Ambulatory Visit (INDEPENDENT_AMBULATORY_CARE_PROVIDER_SITE_OTHER): Payer: Medicare HMO | Admitting: Family Medicine

## 2023-05-25 VITALS — BP 120/84 | HR 78 | Ht 67.0 in | Wt 210.2 lb

## 2023-05-25 DIAGNOSIS — I2510 Atherosclerotic heart disease of native coronary artery without angina pectoris: Secondary | ICD-10-CM

## 2023-05-25 DIAGNOSIS — D6869 Other thrombophilia: Secondary | ICD-10-CM

## 2023-05-25 DIAGNOSIS — I442 Atrioventricular block, complete: Secondary | ICD-10-CM

## 2023-05-25 DIAGNOSIS — Z951 Presence of aortocoronary bypass graft: Secondary | ICD-10-CM

## 2023-05-25 DIAGNOSIS — Z0000 Encounter for general adult medical examination without abnormal findings: Secondary | ICD-10-CM | POA: Diagnosis not present

## 2023-05-25 DIAGNOSIS — Z131 Encounter for screening for diabetes mellitus: Secondary | ICD-10-CM

## 2023-05-25 DIAGNOSIS — I330 Acute and subacute infective endocarditis: Secondary | ICD-10-CM

## 2023-05-25 DIAGNOSIS — E782 Mixed hyperlipidemia: Secondary | ICD-10-CM | POA: Diagnosis not present

## 2023-05-25 DIAGNOSIS — E669 Obesity, unspecified: Secondary | ICD-10-CM | POA: Diagnosis not present

## 2023-05-25 DIAGNOSIS — I5032 Chronic diastolic (congestive) heart failure: Secondary | ICD-10-CM

## 2023-05-25 DIAGNOSIS — I480 Paroxysmal atrial fibrillation: Secondary | ICD-10-CM

## 2023-05-25 DIAGNOSIS — D692 Other nonthrombocytopenic purpura: Secondary | ICD-10-CM

## 2023-05-25 DIAGNOSIS — C61 Malignant neoplasm of prostate: Secondary | ICD-10-CM

## 2023-05-25 DIAGNOSIS — Z8719 Personal history of other diseases of the digestive system: Secondary | ICD-10-CM

## 2023-05-25 DIAGNOSIS — Z23 Encounter for immunization: Secondary | ICD-10-CM | POA: Diagnosis not present

## 2023-05-25 DIAGNOSIS — I4892 Unspecified atrial flutter: Secondary | ICD-10-CM

## 2023-05-25 DIAGNOSIS — Z8673 Personal history of transient ischemic attack (TIA), and cerebral infarction without residual deficits: Secondary | ICD-10-CM

## 2023-05-25 DIAGNOSIS — Z950 Presence of cardiac pacemaker: Secondary | ICD-10-CM

## 2023-05-25 DIAGNOSIS — E118 Type 2 diabetes mellitus with unspecified complications: Secondary | ICD-10-CM | POA: Diagnosis not present

## 2023-05-25 DIAGNOSIS — Z9089 Acquired absence of other organs: Secondary | ICD-10-CM | POA: Insufficient documentation

## 2023-05-25 DIAGNOSIS — R0683 Snoring: Secondary | ICD-10-CM

## 2023-05-25 DIAGNOSIS — E291 Testicular hypofunction: Secondary | ICD-10-CM

## 2023-05-25 LAB — POCT GLYCOSYLATED HEMOGLOBIN (HGB A1C): Hemoglobin A1C: 7.1 % — AB (ref 4.0–5.6)

## 2023-05-25 MED ORDER — EMPAGLIFLOZIN 25 MG PO TABS
25.0000 mg | ORAL_TABLET | Freq: Every day | ORAL | 1 refills | Status: DC
Start: 2023-05-25 — End: 2023-11-11

## 2023-05-25 NOTE — Progress Notes (Deleted)
  Subjective:    Patient ID: [name], male    DOB: [birth year], 73 y.o.   MRN: [id number]  [name] is a 73 y.o. male who presents Medicare wellness, complete exam and for follow-up of Type 2 diabetes mellitus.  Home blood sugar records:  no Current symptoms/problems include nausea and weight loss and have been improving. Daily foot checks: no   Any foot concerns: no How often blood sugars checked: Exercise: The patient does not participate in regular exercise at present. Diet: low carbs The following portions of the patient's history were reviewed and updated as appropriate: allergies, current medications, past medical history, past social history and problem list.  ROS as in subjective above.     Objective:    Physical Exam Alert and in no distress otherwise not examined.  There were no vitals taken for this visit.  Lab Review    Latest Ref Rng & Units 04/09/2023   12:55 PM 01/24/2023   10:00 AM 01/20/2023    9:27 AM 11/23/2022   11:26 AM 11/19/2022   11:37 AM  Diabetic Labs  Chol 0 - 200 mg/dL     528   HDL >41 mg/dL     35   Calc LDL 0 - 99 mg/dL     50   Triglycerides <150 mg/dL     74   Creatinine 3.24 - 1.24 mg/dL 4.01  0.27  2.53  6.64  1.15       04/09/2023    2:30 PM 04/09/2023    1:30 PM 04/09/2023   12:10 PM 04/09/2023   12:09 PM 03/22/2023   12:22 PM  BP/Weight  Systolic BP 117 118  128 116  Diastolic BP 65 77  83 71  Wt. (Lbs)   208    BMI   31.63 kg/m2         No data to display          [name]  reports that he has quit smoking. His smoking use included cigarettes. He started smoking about 45 years ago. He has a 14 pack-year smoking history. He has never been exposed to tobacco smoke. He has never used smokeless tobacco. He reports current alcohol use of about 1.0 standard drink of alcohol per week. He reports that he does not use drugs.     Assessment & Plan:    Routine general medical examination at a health care facility  Senile  purpura (HCC)  Secondary hypercoagulable state (HCC)  S/P CABG x 2  Paroxysmal A-fib (HCC)  Pacemaker  Obesity (BMI 30-39.9)  Mixed hyperlipidemia  Malignant neoplasm of prostate (HCC)  Hypogonadism in male  History of esophageal stricture  Coronary artery disease involving native coronary artery of native heart without angina pectoris  Complete heart block (HCC)  Chronic diastolic heart failure (HCC)  Atrial flutter, unspecified type (HCC)  Need for vaccination against Streptococcus pneumoniae  Screening for diabetes mellitus  Rx changes: {none:33079} Education: Reviewed 'ABCs' of diabetes management (respective goals in parentheses):  A1C (<7), blood pressure (<130/80), and cholesterol (LDL <100). Compliance at present is estimated to be {good/fair/poor:33178}. Efforts to improve compliance (if necessary) will be directed at {compliance:16716}. Follow up: {NUMBERS; 0-10:33138} {time:11}

## 2023-05-25 NOTE — Progress Notes (Signed)
 Complete physical exam  Patient: [name]   DOB: [birth year]   73 y.o. Male  MRN: [id number]  Subjective:    Chief Complaint  Patient presents with   Annual Exam    Medicare well visit. CPE. Fasting. Diabetes follow up. Having problems sleeping. Sense having colonoscopy feeling nausea. On march 1 went to a [place] because of back pain like shoulder blade to shoulder blade still is not better.     [name] is a 73 y.o. male who presents today for a Medicare wellness, complete physical exam and follow-up on medical concerns. He reports consuming a  low carbs  diet. The patient does not participate in regular exercise at present.  He generally feels fairly well. Lack of energy. He reports sleeping poorly.  He wakes up several times per night.  Not necessarily having any urinary symptoms.  Apparently also snores.  He also states he does fall asleep relatively easily.  He does have an extensive cardiac history with CABG, pacemaker, atrial fibs and is on Eliquis.  He also has had bacterial endocarditis and is seen by ID.  Presently he is on Amoxil for this and will probably continue on this forever.  He does have an interesting history of having of thymectomy and subsequent radiation which probably contributed to his underlying cardiac issues.  He also has a history of prostate cancer and did have seeds in 2021.  He does plan to follow-up with urology concerning that.  Continues on Prilosec which is helping with his esophageal issues.  He also has a history of hypogonadism but at this time that not being treated because of his underlying prostate issues.  He also has a previous history of CVA.  Most recent fall risk assessment:    05/25/2023    9:29 AM  Fall Risk   Falls in the past year? 0  Number falls in past yr: 0  Injury with Fall? 0  Risk for fall due to : No Fall Risks  Follow up Falls evaluation completed     Most recent depression screenings:    05/25/2023    9:29 AM  11/23/2022   11:07 AM  PHQ 2/9 Scores  PHQ - 2 Score 0 0    Vision:Within last year and Dental: No current dental problems and Last dental visit: over a year ago needs to know what to do to get to the dentist     Immunization History  Administered Date(s) Administered   DTaP 10/31/1995   Fluad Quad(high Dose 65+) 10/20/2018, 02/26/2022   Fluad Trivalent(High Dose 65+) 11/23/2022   Influenza Split 10/23/2010, 12/27/2011   Influenza, High Dose Seasonal PF 11/05/2015, 04/26/2017   Influenza,inj,Quad PF,6+ Mos 12/17/2013   Influenza-Unspecified 11/26/2020   Janssen (J&J) SARS-COV-2 Vaccination 05/21/2019, 02/27/2020   PNEUMOCOCCAL CONJUGATE-20 05/25/2023   PPD Test 11/05/2015   Pfizer Covid-19 Vaccine Bivalent Booster 49yrs & up 05/05/2021   Pneumococcal Conjugate-13 11/05/2015   Pneumococcal Polysaccharide-23 10/23/2010   Tdap 12/27/2011   Zoster Recombinant(Shingrix) 05/29/2021, 09/07/2021   Zoster, Live 10/23/2010    Health Maintenance  Topic Date Due   Diabetic kidney evaluation - Urine ACR  Never done   COVID-19 Vaccine (4 - 2024-25 season) 10/10/2022   INFLUENZA VACCINE  09/09/2023   Diabetic kidney evaluation - eGFR measurement  04/08/2024   Medicare Annual Wellness (AWV)  05/24/2024   Colonoscopy  03/21/2033   Pneumonia Vaccine 44+ Years old  Completed   Hepatitis C Screening  Completed  Zoster Vaccines- Shingrix  Completed   HPV VACCINES  Aged Out   Meningococcal B Vaccine  Aged Out   DTaP/Tdap/Td  Discontinued    Patient Care Team: Watson Hacking, MD as PCP - General (Family Medicine) Odie Benne, MD as PCP - Cardiology (Cardiology) Boyce Byes, MD as PCP - Electrophysiology (Cardiology) Adelbert Homans, MD as Consulting Physician (Urology) Judyth Nunnery, RN Nurse Navigator as Registered Nurse (Medical Oncology) Kenith Payer, MD as Consulting Physician (Radiation Oncology) Carnell Christian, [facility] (Pharmacist)   Outpatient  Medications Prior to Visit  Medication Sig   acetaminophen (TYLENOL) 325 MG tablet Take 2 tablets (650 mg total) by mouth every 4 (four) hours as needed for moderate pain.   amoxicillin (AMOXIL) 500 MG capsule TAKE 1 CAPSULE BY MOUTH THREE TIMES A DAY   apixaban (ELIQUIS) 5 MG TABS tablet TAKE 1 TABLET BY MOUTH TWICE DAILY   atorvastatin (LIPITOR) 20 MG tablet Take 1 tablet (20 mg total) by mouth daily.   digoxin (LANOXIN) 0.125 MG tablet TAKE 1 TABLET BY MOUTH EVERY DAY   empagliflozin (JARDIANCE) 10 MG TABS tablet Take 1 tablet (10 mg total) by mouth daily before breakfast.   furosemide (LASIX) 20 MG tablet Take 1 tablet (20 mg total) by mouth daily.   metoprolol succinate (TOPROL-XL) 100 MG 24 hr tablet Take 1 tablet (100 mg total) by mouth daily. Take with or immediately following a meal.   omeprazole (PRILOSEC OTC) 20 MG tablet Take 1 tablet (20 mg total) by mouth daily.   oxymetazoline (AFRIN) 0.05 % nasal spray Place 1 spray into both nostrils at bedtime as needed for congestion.   polyethylene glycol powder (GLYCOLAX/MIRALAX) 17 GM/SCOOP powder Take 17 g by mouth daily as needed for mild constipation.   spironolactone (ALDACTONE) 25 MG tablet Take 1 tablet (25 mg total) by mouth daily. NEEDS FOLLOW UP APPOINTMENT FOR MORE REFILLS   triamcinolone cream (KENALOG) 0.1 % Apply 1 Application topically 2 (two) times daily.   clotrimazole (LOTRIMIN) 1 % cream Apply 1 Application topically 2 (two) times daily. (Patient not taking: Reported on 05/25/2023)   ferrous sulfate 325 (65 FE) MG tablet Take 325 mg by mouth daily with breakfast.   No facility-administered medications prior to visit.    Review of Systems  All other systems reviewed and are negative.   Family and social history as well as health maintenance and immunizations was reviewed.     Objective:       Physical Exam  Alert and in no distress. Tympanic membranes and canals are normal. Pharyngeal area is normal. Neck is  supple without adenopathy or thyromegaly. Cardiac exam shows an irregular rhythm with a grade 2 systolic murmur with loud S2 lungs are clear to auscultation.  Purpuric lesions noted on forearms. Hemoglobin A1c is 7.1     Assessment & Plan:      Routine general medical examination at a health care facility  Senile purpura (HCC)  Secondary hypercoagulable state (HCC)  S/P CABG x 2  Paroxysmal A-fib (HCC)  Pacemaker  Obesity (BMI 30-39.9)  Mixed hyperlipidemia  Malignant neoplasm of prostate (HCC)  Hypogonadism in male  History of esophageal stricture  Coronary artery disease involving native coronary artery of native heart without angina pectoris  Complete heart block (HCC)  Chronic diastolic heart failure (HCC)  Need for vaccination against Streptococcus pneumoniae - Plan: Pneumococcal conjugate vaccine 20-valent (Prevnar 20)  Controlled type 2 diabetes mellitus with complication, without long-term current use of  insulin (HCC) - Plan: POCT glycosylated hemoglobin (Hb A1C), empagliflozin (JARDIANCE) 25 MG TABS tablet  Chronic bacterial endocarditis  History of thymectomy  Snoring - Plan: Home sleep test  History of CVA (cerebrovascular accident)  He will continue to be followed by cardiology as well as urology and infectious disease.  Recommend he get RSV and Tdap .  I increased his Jardiance to help with his underlying diabetes and strongly encouraged him to be as active as he can.  The endocarditis is really interfered with his physical activity level.  He has symptoms that I think are probably sleep apnea related and I will therefore follow-up on that. Return in about 6 months (around 11/24/2023).      Ron Cobbs, MD

## 2023-05-25 NOTE — Patient Instructions (Signed)
 Go to American diabetes Association website and look up diet and exercise specifically looking at carbohydrates

## 2023-05-26 ENCOUNTER — Telehealth: Payer: Self-pay | Admitting: Internal Medicine

## 2023-05-26 ENCOUNTER — Encounter: Payer: Self-pay | Admitting: Internal Medicine

## 2023-05-26 NOTE — Telephone Encounter (Signed)
 Called pt he is on 500 mg amox rid. Counseled to take 1gm tid of amox 48hr before through 48 hours after dental procedure then can return to taking 500mg  po tid of amox.

## 2023-06-06 ENCOUNTER — Telehealth: Payer: Self-pay

## 2023-06-23 ENCOUNTER — Telehealth (HOSPITAL_COMMUNITY): Payer: Self-pay | Admitting: Cardiology

## 2023-06-23 NOTE — Telephone Encounter (Addendum)
 Patient called to see if a decrease in diuretics is needed  Reports PCP increased Jardiance   to 25 mg daily  Wanted to be sure he is not on too many diuretics at once  Pt is asymptomatic-reports he feels fine Weight down to 205 Blood glucose is monitored by pcp however pt reports as stable Lasix  at 20 mg daily Spiro at 25 mg daily   Please advise

## 2023-06-24 NOTE — Telephone Encounter (Signed)
Pt aware.

## 2023-06-28 NOTE — Progress Notes (Signed)
 Remote pacemaker transmission.

## 2023-06-29 ENCOUNTER — Telehealth (HOSPITAL_COMMUNITY): Payer: Self-pay | Admitting: Cardiology

## 2023-06-29 NOTE — Telephone Encounter (Signed)
 Called to confirm/remind patient of [facility] appointment [facility] on [facility]/29[facility] .   Appointment:   [x] Confirmed  [] Left mess   [] No answer/No voice mail  [] VM Full/unable to leave message  [] Phone not in service  Patient reminded to bring all medications and/or complete list.  Confirmed patient has transportation. Gave directions, instructed to utilize valet parking.

## 2023-06-30 ENCOUNTER — Ambulatory Visit (HOSPITAL_COMMUNITY)
Admission: RE | Admit: 2023-06-30 | Discharge: 2023-06-30 | Disposition: A | Discharge status: Home / Self Care | Source: Ambulatory Visit | Attending: Cardiology | Admitting: Cardiology

## 2023-06-30 VITALS — BP 120/70 | HR 78 | Wt 206.6 lb

## 2023-06-30 DIAGNOSIS — Z951 Presence of aortocoronary bypass graft: Secondary | ICD-10-CM | POA: Insufficient documentation

## 2023-06-30 DIAGNOSIS — T826XXD Infection and inflammatory reaction due to cardiac valve prosthesis, subsequent encounter: Secondary | ICD-10-CM | POA: Insufficient documentation

## 2023-06-30 DIAGNOSIS — I110 Hypertensive heart disease with heart failure: Secondary | ICD-10-CM | POA: Diagnosis not present

## 2023-06-30 DIAGNOSIS — I361 Nonrheumatic tricuspid (valve) insufficiency: Secondary | ICD-10-CM | POA: Insufficient documentation

## 2023-06-30 DIAGNOSIS — I50810 Right heart failure, unspecified: Secondary | ICD-10-CM | POA: Diagnosis not present

## 2023-06-30 DIAGNOSIS — Z950 Presence of cardiac pacemaker: Secondary | ICD-10-CM | POA: Insufficient documentation

## 2023-06-30 DIAGNOSIS — G4730 Sleep apnea, unspecified: Secondary | ICD-10-CM | POA: Insufficient documentation

## 2023-06-30 DIAGNOSIS — Z9889 Other specified postprocedural states: Secondary | ICD-10-CM | POA: Insufficient documentation

## 2023-06-30 DIAGNOSIS — I2510 Atherosclerotic heart disease of native coronary artery without angina pectoris: Secondary | ICD-10-CM | POA: Diagnosis not present

## 2023-06-30 DIAGNOSIS — I5032 Chronic diastolic (congestive) heart failure: Secondary | ICD-10-CM | POA: Diagnosis not present

## 2023-06-30 DIAGNOSIS — I442 Atrioventricular block, complete: Secondary | ICD-10-CM | POA: Diagnosis not present

## 2023-06-30 DIAGNOSIS — E785 Hyperlipidemia, unspecified: Secondary | ICD-10-CM | POA: Diagnosis not present

## 2023-06-30 DIAGNOSIS — I480 Paroxysmal atrial fibrillation: Secondary | ICD-10-CM

## 2023-06-30 DIAGNOSIS — T82857D Stenosis of cardiac prosthetic devices, implants and grafts, subsequent encounter: Secondary | ICD-10-CM | POA: Insufficient documentation

## 2023-06-30 DIAGNOSIS — Z952 Presence of prosthetic heart valve: Secondary | ICD-10-CM | POA: Diagnosis not present

## 2023-06-30 DIAGNOSIS — I70208 Unspecified atherosclerosis of native arteries of extremities, other extremity: Secondary | ICD-10-CM | POA: Insufficient documentation

## 2023-06-30 DIAGNOSIS — Z7984 Long term (current) use of oral hypoglycemic drugs: Secondary | ICD-10-CM | POA: Insufficient documentation

## 2023-06-30 DIAGNOSIS — Z7901 Long term (current) use of anticoagulants: Secondary | ICD-10-CM | POA: Insufficient documentation

## 2023-06-30 DIAGNOSIS — I4892 Unspecified atrial flutter: Secondary | ICD-10-CM | POA: Diagnosis not present

## 2023-06-30 DIAGNOSIS — I330 Acute and subacute infective endocarditis: Secondary | ICD-10-CM | POA: Insufficient documentation

## 2023-06-30 DIAGNOSIS — G4719 Other hypersomnia: Secondary | ICD-10-CM

## 2023-06-30 LAB — BASIC METABOLIC PANEL WITH GFR
Anion gap: 10 (ref 5–15)
BUN: 13 mg/dL (ref 8–23)
CO2: 26 mmol/L (ref 22–32)
Calcium: 9.6 mg/dL (ref 8.9–10.3)
Chloride: 100 mmol/L (ref 98–111)
Creatinine, Ser: 1.06 mg/dL (ref 0.61–1.24)
GFR, Estimated: >60 mL/min (ref >60)
Glucose, Bld: 125 mg/dL — ABNORMAL HIGH (ref 70–99)
Potassium: 4.5 mmol/L (ref 3.5–5.1)
Sodium: 136 mmol/L (ref 135–145)

## 2023-06-30 LAB — DIGOXIN LEVEL: Digoxin Level: 0.8 ng/mL (ref 0.8–2.0)

## 2023-06-30 LAB — BRAIN NATRIURETIC PEPTIDE: B Natriuretic Peptide: 422.4 pg/mL — ABNORMAL HIGH (ref 0.0–100.0)

## 2023-06-30 NOTE — Progress Notes (Incomplete)
 ADVANCED HEART FAILURE CLINIC NOTE  Referring Physician: Watson Hacking, MD  Primary Care: Watson Hacking, MD   HPI: [name] is a 73 y.o. male with coronary artery disease status post CABG x 2 with left atrial appendage clipping and PFO closure, severe aortic stenosis and mitral stenosis status post bioprosthetic AVR and MVR, bioprosthetic mitral valve endocarditis (currently on IV antibiotics), hyperlipidemia, complete heart block status post permanent pacemaker, history of atrial flutter currently presenting today for follow up.. Based on chart review prior to his bioprosthetic AVR and MVR Mr. Wilmeth had preserved LV and RV function.  Shortly after the procedure he had a pacemaker placed for complete heart block.  Since that time echocardiograms demonstrate enlarging right ventricle with progressively worsening tricuspid regurgitation.    In May 2024, he presented with left upper extremity ischemia with acute occlusion of the left proximal brachial artery status post thromboembolectomy by vascular surgery.  During that time he had a TEE which demonstrated severe bioprosthetic mitral valve endocarditis; large vegetation on mitral valve leaflets.  Blood cultures were positive for Enterococcus faecalis.  He was diuresed with IV Lasix  and discharged home on ampicillin  and Rocephin  for 6 weeks (starting 06/18/2022).    Interval hx:  Since our last visit, Mr. Soeder has been doing fairly well. He has minimal to no lightheadedness. He has no problems with ADLs, shortness of breath, PND, LE edema. Compliant with all medications. Currently taking lasix  20mg  daily.   Activity level/exercise tolerance: NYHA IIB Orthopnea:  Sleeps on 2 pillows Paroxysmal noctural dyspnea:  no Chest pain/pressure:  no Orthostatic lightheadedness:  no Palpitations:  no Lower extremity edema:  No Presyncope/syncope:  no Cough:  no  Current Outpatient Medications  Medication Sig Dispense Refill   acetaminophen   (TYLENOL ) 325 MG tablet Take 2 tablets (650 mg total) by mouth every 4 (four) hours as needed for moderate pain.     amoxicillin  (AMOXIL ) 500 MG capsule TAKE 1 CAPSULE BY MOUTH THREE TIMES A DAY 90 capsule 3   apixaban  (ELIQUIS ) 5 MG TABS tablet TAKE 1 TABLET BY MOUTH TWICE DAILY 60 tablet 11   atorvastatin  (LIPITOR) 20 MG tablet Take 1 tablet (20 mg total) by mouth daily. 90 tablet 3   clotrimazole  (LOTRIMIN ) 1 % cream Apply 1 Application topically as needed.     digoxin  (LANOXIN ) 0.125 MG tablet TAKE 1 TABLET BY MOUTH EVERY DAY 90 tablet 3   empagliflozin  (JARDIANCE ) 25 MG TABS tablet Take 1 tablet (25 mg total) by mouth daily before breakfast. 90 tablet 1   furosemide  (LASIX ) 20 MG tablet Take 1 tablet (20 mg total) by mouth daily. 30 tablet 11   metoprolol  succinate (TOPROL -XL) 100 MG 24 hr tablet Take 1 tablet (100 mg total) by mouth daily. Take with or immediately following a meal. 90 tablet 3   omeprazole  (PRILOSEC  OTC) 20 MG tablet Take 1 tablet (20 mg total) by mouth daily. 90 tablet 3   oxymetazoline (AFRIN) 0.05 % nasal spray Place 1 spray into both nostrils at bedtime as needed for congestion.     polyethylene glycol powder (GLYCOLAX /MIRALAX ) 17 GM/SCOOP powder Take 17 g by mouth daily as needed for mild constipation. 238 g 0   spironolactone  (ALDACTONE ) 25 MG tablet Take 1 tablet (25 mg total) by mouth daily. NEEDS FOLLOW UP APPOINTMENT FOR MORE REFILLS 90 tablet 0   triamcinolone  cream (KENALOG ) 0.1 % Apply 1 Application topically 2 (two) times daily.     No current facility-administered  medications for this encounter.      PHYSICAL EXAM: Vitals:   06/30/23 1337  BP: 120/70  Pulse: 78  SpO2: 93%   GENERAL: NAD Lungs- normal work of breathing CARDIAC:  JVP: 7 cm          Normal rate with regular rhythm. no murmur.  Pulses 2+. 1+ edema.  ABDOMEN: Soft, non-tender, non-distended.  EXTREMITIES: Warm and well perfused.  NEUROLOGIC: No obvious FND    DATA  REVIEW  ECG: 06/03/22: NSR  as per my personal interpretation  ECHO: 07/06/22: LVEF 50-55%, large vegetation on bioprosthetic mitral valve; dilated RV.  06/16/22 (TEE): LVEF 50-55%, 4+ TR, 2x2cm vegetation on bioprosthetic mitral valve.  05/23/22: Normal LV function, severely dilated RV with moderately reduced function, moderate stenosis of prosthesis (gradient ), severe TR due to poor coaptation of leaflets, s/p AVR as per my personal interpretation 06/13/19: Normal LV function with septal bowing to the left; dilated RV with mildly reduced function. Mitral bioprosthesis gradient of 06/27/18: Normal LV function, normal RV function. Severe MS/AS with moderate AI.    CATH: 05/25/22:   Prox LAD lesion is 40% stenosed.   Mid LAD lesion is 95% stenosed.   Prox Cx to Mid Cx lesion is 30% stenosed.   1st Mrg lesion is 30% stenosed.   3rd Mrg lesion is 30% stenosed.   Prox RCA lesion is 100% stenosed.   SVG graft was visualized by angiography and is normal in caliber.   LIMA graft was visualized by angiography and is normal in caliber.   Severe mid LAD stenosis. Competitive filling is seen distally from the patent LIMA graft.  Patent LIMA graft to the mid LAD.  Mild non-obstructive disease in the Circumflex artery.  Large dominant RCA with chronic proximal occlusion. The mid and distal RCA fills from the patent vein graft.  Near normal right and left heart pressures.  CO 6.92 L/min CI 3.33  09/15/2018: Prox RCA lesion is 100% stenosed. Prox Cx to Mid Cx lesion is 30% stenosed. 1st Mrg lesion is 30% stenosed. 3rd Mrg lesion is 30% stenosed. Prox LAD lesion is 40% stenosed. Mid LAD lesion is 95% stenosed.   1. Severe stenosis mid LAD 2. Mild non-obstructive disease in the LAD 3. Chronic occlusion of the proximal RCA. Filling of the proximal, mid and distal RCA from left to right collaterals 4. Severe aortic stenosis (mean gradient 34.1 mmHg, peak to peak gradient 32 mmHg, AVA 1.1  cm2) 5. Severe mitral stenosis by echo.     ASSESSMENT & PLAN:   1. RV dysfunction - RHC hemodynamics from 05/25/22 personally interpretted:  RA 10 (large V waves), RV 28/6-10, PA 25/15, PCWP 14,  -RV dysfunction is now likely multifactorial: he is chronically RV paced with device interrogation from 05/15/22 demonstrating 96% RV pacing, he has a dilated RV leading to poor coaptation of the tricuspid valve and as a result severe TR and in addition has likely moderate to severe stenosis of his bioprosthetic mitral valve. From a hemodynamic standpoint though the MS does not appear to be having a significant effect on his PA pressures or PCWP.  - Prior to MVR/AVR, his LV function and RV function were intact. Since that time, serial echocardiograms demonstrate worsening TR and RV dilation. Although he has moderate to severe bioprosthetic MS it has not impacted his PA pressures or PCWP enough to compromise RV function to this point. RV dilation may be secondary to chronic RV pacing leading to TR due to  poor valve coaptation.  - Euvolemic, taking lasix  20mg  daily.  - In the setting of recently diagnosed endocarditis he is high risk for any procedures; doing very well functionally. Will continue to medically manage.  - continue spironolactone  25mg  daily - Diffuse ratch after starting losartan . Saw dermatology with biopsy; ?dermatitis.  - continue toprol  100mg  daily.  - continue digoxin , will repeat TTE and plan to wean off if RV function is stable. Will repeat digoxin  level today.   2. Bioprosthetic mitral valve endocarditis & stenosis - TEE on 06/16/22 with severe bioprosthetic mitral valve endocarditis - Repeat TTE on 5/28 with persistent vegetation.  - Repeat TTE  w/ mild to moderately reduced RV function; mitral valve stenosis remains severe with thickening of the mitral valve.  - Completed 6 weeks of IV antibiotics. PICC line removed.  - continue toprol  100mg  daily; no fevers/chills or signs of  systemic infection.   3. Severe TR - Appears functional and secondary to RV dilation leading to poor coaptation. See above.  - Repeat TTE pending.   4. CAD s/p CABG x 2, closure of PFO - AVR, MVR and CABG x 2 (LIMA-LAD, SVG to RCA) in 10/11/2018 - Continue toprol  100mg  daily - No chest pain; improving exercise capacity.   5. pAFL - continue metoprolol  - apixaban  5mg  BID - No AFL/AF on device interogation  6. Left brachial artery occlusion  - due to mitral valve endocarditis - Normal pulses now.   7. Healthcare maintenance - Continue jardiance ; A1C 6.7  8. Medtronic ICD - No AT/AF; 90% V paced.   9. Hypertension - BP 120/78 - He will keep a BP log for me and bring it back follow up.   10. Sleep apnea - Snores at night; daytime fatigue & RVF concerning for underlying sleep apnea.  - Will order ITAMAR home sleep study.    Julianny Milstein Advanced Heart Failure Mechanical Circulatory Support

## 2023-06-30 NOTE — Progress Notes (Signed)
 Height: 5'7"   Weight: 206 lb BMI: 32.36  Today's Date: 06/30/23  STOP BANG RISK ASSESSMENT S (snore) Have you been told that you snore?     YES   T (tired) Are you often tired, fatigued, or sleepy during the day?   YES  O (obstruction) Do you stop breathing, choke, or gasp during sleep? NO   P (pressure) Do you have or are you being treated for high blood pressure? YES   B (BMI) Is your body index greater than 35 kg/m? NO   A (age) Are you 73 years old or older? YES   N (neck) Do you have a neck circumference greater than 16 inches?      G (gender) Are you a male? YES   TOTAL STOP/BANG "YES" ANSWERS 5                                                                       For Office Use Only              Procedure Order Form    YES to 3+ Stop Bang questions OR two clinical symptoms - patient qualifies for WatchPAT (CPT 95800)      Clinical Notes: Will consult Sleep Specialist and refer for management of therapy due to patient increased risk of Sleep Apnea. Ordering a sleep study due to the following two clinical symptoms: Excessive daytime sleepiness G47.10 / Loud snoring R06.83

## 2023-06-30 NOTE — Patient Instructions (Signed)
 Great to see you today!!!  Medication Changes:  None, continue current medications  Lab Work:  Labs done today, your results will be available in MyChart, we will contact you for abnormal readings.  Testing/Procedures:  Your physician has requested that you have an echocardiogram. Echocardiography is a painless test that uses sound waves to create images of your heart. It provides your doctor with information about the size and shape of your heart and how well your heart's chambers and valves are working. This procedure takes approximately one hour. There are no restrictions for this procedure. Please do NOT wear cologne, perfume, aftershave, or lotions (deodorant is allowed). Please arrive 15 minutes prior to your appointment time.  Please note: We ask at that you not bring children with you during ultrasound (echo/ vascular) testing. Due to room size and safety concerns, children are not allowed in the ultrasound rooms during exams. Our front office staff cannot provide observation of children in our lobby area while testing is being conducted. An adult accompanying a patient to their appointment will only be allowed in the ultrasound room at the discretion of the ultrasound technician under special circumstances. We apologize for any inconvenience.   Your provider has recommended that you have a home sleep study (Itamar Test).  We have provided you with the equipment in our office today. Please go ahead and download the app. DO NOT OPEN OR TAMPER WITH THE BOX UNTIL WE ADVISE YOU TO DO SO. Once insurance has approved the test our office will call you with PIN number and approval to proceed with testing. Once you have completed the test you just dispose of the equipment, the information is automatically uploaded to us  via blue-tooth technology. If your test is positive for sleep apnea and you need a home CPAP machine you will be contacted by Dr Charl Concha office [facility] to set this  up.   Special Instructions // Education:  Do the following things EVERYDAY: Weigh yourself in the morning before breakfast. Write it down and keep it in a log. Take your medicines as prescribed Eat low salt foods--Limit salt (sodium) to 2000 mg per day.  Stay as active as you can everyday Limit all fluids for the day to less than 2 liters   Follow-Up in: 6 months (November), **PLEASE CALL OUR OFFICE IN SEPTEMBER TO SCHEDULE THIS APPOINTMENT   At the [facility], you and your health needs are our priority. We have a designated team specialized in the treatment of Heart Failure. This Care Team includes your primary Heart Failure Specialized Cardiologist (physician), Advanced Practice Providers (APPs- Physician Assistants and Nurse Practitioners), and Pharmacist who all work together to provide you with the care you need, when you need it.   You may see any of the following providers on your designated Care Team at your next follow up:  Dr. Jules Oar Dr. Peder Bourdon Dr. Alwin Baars Dr. Judyth Nunnery Nieves Bars, NP Ruddy Corral, [place] [facility], [place] Dennise Fitz, NP [place] Lee, NP Luster Salters, PharmD   Please be sure to bring in all your medications bottles to every appointment.   Need to Contact Us :  If you have any questions or concerns before your next appointment please send us  a message through [place] or call our office at [phone].    TO LEAVE A MESSAGE FOR THE NURSE SELECT OPTION 2, PLEASE LEAVE A MESSAGE INCLUDING: YOUR NAME DATE OF BIRTH CALL BACK NUMBER REASON FOR CALL**this is important  as we prioritize the call backs  YOU WILL RECEIVE A CALL BACK THE SAME DAY AS LONG AS YOU CALL BEFORE 4:00 PM

## 2023-06-30 NOTE — Progress Notes (Signed)
 ITAMAR home sleep study given to patient, all instructions explained, waiver signed, and CLOUDPAT registration complete.  No pre-cert is req, advised pt to proceed with testing, he is agreeable.

## 2023-07-04 ENCOUNTER — Encounter (INDEPENDENT_AMBULATORY_CARE_PROVIDER_SITE_OTHER): Admitting: Cardiology

## 2023-07-04 DIAGNOSIS — G4733 Obstructive sleep apnea (adult) (pediatric): Secondary | ICD-10-CM | POA: Diagnosis not present

## 2023-07-05 ENCOUNTER — Other Ambulatory Visit: Payer: Self-pay

## 2023-07-06 NOTE — Patient Outreach (Signed)
 Complex Care Management   Visit Note  07/06/2023  Name:  [name] MRN: [id number] DOB: [birth year]  Situation: Referral received for Complex Care Management related to Heart Failure and CAD, Paroxsymal Afib, history of Endocarditis. I obtained verbal consent from Patient.  Visit completed with patient on the phone.   Background:   Past Medical History:  Diagnosis Date   BCE (basal cell epithelioma), arm    RIGHT SHOULDER   Bradycardia    Bumps on skin    on left side of nose for last 3 weeks   CAD (coronary artery disease)    a. s/p CABGx2 10/11/18 LIMA to LAD, SVG to RCA, EVH via right thigh.   Dyslipidemia    Dysrhythmia    aflutter/afib   Ectatic thoracic aorta (HCC)    Esophageal stricture    GERD (gastroesophageal reflux disease)    Heart murmur    sees dr Denice Finney   Hyperlipidemia    Incidental pulmonary nodule, > 3mm and < 8mm 10/04/2018   Noted on CTA   Inguinal hernia    bilateral   Obesity    Presence of permanent cardiac pacemaker    Prostate cancer (HCC)    S/P aortic valve replacement with bioprosthetic valve 10/11/2018   23 mm Edwards Inspiris Resilia stented bovine pericardial tissue valve, miltral valve done also   S/P CABG x 2 10/11/2018   LIMA to LAD, SVG to RCA, EVH via right thigh   S/P mitral valve replacement with bioprosthetic valve 10/11/2018   27 mm Medtronic Mosaic stented porcine bioprosthetic tissue valve   S/P patent foramen ovale closure 10/11/2018   S/P placement of cardiac pacemaker    Smoker    Stroke [facility])     Assessment: Patient Reported Symptoms:  Cognitive Cognitive Status: Alert and oriented to person, place, and time, Difficulties with attention and concentration Cognitive/Intellectual Conditions Management [RPT]: None reported or documented in medical history or problem list   Health Maintenance Behaviors: Annual physical exam, Immunizations, Healthy diet, Social activities Healing Pattern: Slow Health Facilitated by:  Healthy diet  Neurological Neurological Review of Symptoms: No symptoms reported    HEENT HEENT Symptoms Reported: Runny nose HEENT Management Strategies: Medication therapy, Routine screening HEENT Self-Management Outcome: 4 (good)    Cardiovascular Cardiovascular Symptoms Reported: Fatigue Does patient have uncontrolled Hypertension?: No Cardiovascular Management Strategies: Medication therapy, Routine screening, Fluid modification Weight: 206 lb (93.4 kg) Cardiovascular Self-Management Outcome: 4 (good)  Respiratory Respiratory Symptoms Reported: Shortness of breath, Productive cough Respiratory Conditions: Sleep disordered breathing, Seasonal allergies Respiratory Self-Management Outcome: 4 (good)  Endocrine Patient reports the following symptoms related to hypoglycemia or hyperglycemia : Nausea or vomiting, Weight loss Is patient diabetic?: Yes Is patient checking blood sugars at home?: No Endocrine Conditions: Diabetes Endocrine Management Strategies: Medication therapy, Routine screening Endocrine Self-Management Outcome: 4 (good)  Gastrointestinal Gastrointestinal Symptoms Reported: Nausea Gastrointestinal Conditions: Reflux/heartburn Gastrointestinal Management Strategies: Medication therapy Gastrointestinal Self-Management Outcome: 4 (good) Nutrition Risk Screen (CP): No indicators present  Genitourinary Genitourinary Symptoms Reported: Frequency Genitourinary Conditions: Frequency Genitourinary Management Strategies:  (routine screening) Genitourinary Self-Management Outcome: 4 (good) Genitourinary Comment: hx of stage IIB prostate CA, scheduled for 1 year follow up on June 27 w/[facility]  Integumentary Integumentary Symptoms Reported: Bruising Skin Conditions: Other Other Skin Conditions: bruising Skin Management Strategies: Routine screening, Medication therapy Skin Self-Management Outcome: 4 (good)  Musculoskeletal Musculoskelatal Symptoms Reviewed:  Weakness Musculoskeletal Conditions: Other Other Musculoskeletal Conditions: some difficulty with stair climbing but no concerns and no reported  falls Musculoskeletal Management Strategies: Routine screening Musculoskeletal Self-Management Outcome: 4 (good) Falls in the past year?: No Number of falls in past year: 1 or less Was there an injury with Fall?: No Fall Risk Category Calculator: 0 Patient Fall Risk Level: Low Fall Risk Patient at Risk for Falls Due to: No Fall Risks Fall risk Follow up: Falls evaluation completed, Education provided  Psychosocial Psychosocial Symptoms Reported: No symptoms reported   Major Change/Loss/Stressor/Fears (CP): Denies Techniques to Cope with Loss/Stress/Change: Diversional activities Quality of Family Relationships: involved, supportive, helpful Do you feel physically threatened by others?: No      07/05/2023   12:28 PM  Depression screen PHQ 2/9  Decreased Interest 0  Down, Depressed, Hopeless 0  PHQ - 2 Score 0    Vitals:   07/05/23 1139  BP: 120/70  Pulse: 78    Medications Reviewed Today     Reviewed by Kaylene Pascal, RN (Registered Nurse) on 07/05/23 at 1205  Med List Status: <None>   Medication Order Taking? Sig Documenting Provider Last Dose Status Informant  acetaminophen  (TYLENOL ) 325 MG tablet [id number] Yes Take 2 tablets (650 mg total) by mouth every 4 (four) hours as needed for moderate pain. Faith Homes, MD Taking Active            Med Note Alfredo Inch   Thu Jun 30, 2023  1:34 PM)    ammonium lactate (AMLACTIN) 12 % cream [id number] Yes Apply 1 Application topically as needed for dry skin. Per dermatologist with [place] [facility] Estel Heir, PA-C Taking Active   amoxicillin  (AMOXIL ) 500 MG capsule [id number] Yes TAKE 1 CAPSULE BY MOUTH THREE TIMES A DAY Orlie Bjornstad, MD Taking Active   apixaban  (ELIQUIS ) 5 MG TABS tablet [id number] Yes TAKE 1 TABLET BY MOUTH TWICE DAILY Sabharwal, Aditya, DO Taking  Active            Med Note Alfredo Inch   Thu Jun 30, 2023  1:35 PM)    atorvastatin  (LIPITOR) 20 MG tablet [id number] Yes Take 1 tablet (20 mg total) by mouth daily. Watson Hacking, MD Taking Active Self, Pharmacy Records, Spouse/Significant Other  cetirizine (ZYRTEC) 10 MG tablet [id number] Yes Take 10 mg by mouth daily. [provider] Taking Active   clotrimazole  (LOTRIMIN ) 1 % cream [id number] No Apply 1 Application topically as needed.  Patient not taking: Reported on 07/05/2023   [provider] Not Taking Active   digoxin  (LANOXIN ) 0.125 MG tablet [id number] Yes TAKE 1 TABLET BY MOUTH EVERY DAY Sabharwal, Aditya, DO Taking Active   empagliflozin  (JARDIANCE ) 25 MG TABS tablet [id number] Yes Take 1 tablet (25 mg total) by mouth daily before breakfast. Watson Hacking, MD Taking Active   furosemide  (LASIX ) 20 MG tablet [id number] Yes Take 1 tablet (20 mg total) by mouth daily.  Patient taking differently: Take 20 mg by mouth daily. Patient is taking every other day.   Sabharwal, Aditya, DO Taking Active   metoprolol  succinate (TOPROL -XL) 100 MG 24 hr tablet [id number] Yes Take 1 tablet (100 mg total) by mouth daily. Take with or immediately following a meal. Sabharwal, Aditya, DO Taking Active   omeprazole  (PRILOSEC  OTC) 20 MG tablet [id number] Yes Take 1 tablet (20 mg total) by mouth daily. Felecia Hopper, MD Taking Active   oxymetazoline (AFRIN) 0.05 % nasal spray [id number] Yes Place 1 spray into both nostrils at bedtime as needed for congestion. [provider] Taking Active Self, Pharmacy Records, Spouse/Significant Other  Med Note Alfredo Inch   Thu Jun 30, 2023  1:37 PM)    polyethylene glycol powder (GLYCOLAX /MIRALAX ) 17 GM/SCOOP powder [id number] No Take 17 g by mouth daily as needed for mild constipation.  Patient not taking: Reported on 07/05/2023   Akula, Vijaya, MD Not Taking Active Self, Pharmacy Records, Spouse/Significant Other            Med Note Apolinar Klippel Jun 30, 2023  1:36 PM)    spironolactone  (ALDACTONE ) 25 MG tablet [id number] Yes Take 1 tablet (25 mg total) by mouth daily. NEEDS FOLLOW UP APPOINTMENT FOR MORE REFILLS Sabharwal, Aditya, DO Taking Active   triamcinolone  cream (KENALOG ) 0.1 % [id number] Yes Apply 1 Application topically 2 (two) times daily. [provider] Taking Active             Recommendation:   PCP Follow-up  Follow Up Plan:   Telephone follow up appointment date/time:  Friday, July 11 at 11:00 AM  Louanne Roussel RN BSN CCM [place]  [facility] Nurse Care Coordinator  Direct Dial: [phone] Website: [email or web address]

## 2023-07-06 NOTE — Patient Instructions (Signed)
 Visit Information  Thank you for taking time to visit with me today. Please don't hesitate to contact me if I can be of assistance to you before our next scheduled appointment.  Our next appointment is by telephone on Friday, July 11 at 11:00 AM Please call the care guide team at [phone] if you need to cancel or reschedule your appointment.   Following is a copy of your care plan:   Goals Addressed             This Visit's Progress            VBCI RN Care Plan related to CHF, Paroxsymal Atrial Fib, history of acute endocarditis       Problems:  Chronic Disease Management support and education needs related to Atrial Fibrillation, CAD, CHF, and s/p valve replacement with history of acute endocarditis  Goal: Over the next 90 days the Patient will continue to work with RN Care Manager and/or Social Worker to address care management and care coordination needs related to Atrial Fibrillation, CAD, CHF, and s/p valve replacement, history of acute endocarditis as evidenced by adherence to care management team scheduled appointments      Interventions:   AFIB Interventions:   Counseled on increased risk of stroke due to Afib and benefits of anticoagulation for stroke prevention Reviewed importance of adherence to anticoagulant exactly as prescribed Counseled on avoidance of NSAIDs due to increased bleeding risk with anticoagulants Counseled on importance of regular laboratory monitoring as prescribed Counseled on seeking medical attention after a head injury or if there is blood in the urine/stool   CAD Interventions: Assessed understanding of CAD diagnosis Medications reviewed including medications utilized in CAD treatment plan Provided education on importance of blood pressure control in management of CAD Provided education on Importance of limiting foods high in cholesterol Counseled on importance of regular laboratory monitoring as prescribed Counseled on the importance of  exercise goals with target of 150 minutes per week Reviewed Importance of attending all scheduled provider appointments Advised to report any changes in symptoms or exercise tolerance   Heart Failure Interventions: Basic overview and discussion of pathophysiology of Heart Failure reviewed Provided education on low sodium diet Reviewed Heart Failure Action Plan in depth and provided written copy Provided education about placing scale on hard, flat surface Advised patient to weigh each morning after emptying bladder Discussed importance of daily weight and advised patient to weigh and record daily Reviewed role of diuretics in prevention of fluid overload and management of heart failure; Discussed the importance of keeping all appointments with provider Provided patient with education about the role of exercise in the management of heart failure  Patient Self-Care Activities:  Attend all scheduled provider appointments Call pharmacy for medication refills 3-7 days in advance of running out of medications Call provider office for new concerns or questions  Take medications as prescribed   call office if I gain more than 2 pounds in one day or 5 pounds in one week watch for swelling in feet, ankles and legs every day weigh myself daily develop a rescue plan follow rescue plan if symptoms flare-up  Plan:  Echocardiogram scheduled for Tuesday, July 10 at 1:00 PM Telephone follow up appointment with care management team member scheduled for:  Friday, July 11 at 11:00 AM     VBCI RN Care Plan related to type 2 diabetes       Problems:  Chronic Disease Management support and education needs related to DMII  Goal:  Over the next 90 days the Patient will continue to work with Medical illustrator and/or Social Worker to address care management and care coordination needs related to DMII as evidenced by adherence to care management team scheduled appointments      Interventions:   Diabetes  Interventions: Assessed patient's understanding of A1c goal: <7% Provided education to patient about basic DM disease process Reviewed medications with patient and discussed importance of medication adherence Review of patient status, including review of consultants reports, relevant laboratory and other test results, and medications completed Lab Results  Component Value Date   HGBA1C 7.1 (A) 05/25/2023    Patient Self-Care Activities:  Attend all scheduled provider appointments Call pharmacy for medication refills 3-7 days in advance of running out of medications Call provider office for new concerns or questions  Take medications as prescribed   check feet daily for cuts, sores or redness drink 6 to 8 glasses of water each day fill half of plate with vegetables limit fast food meals to no more than 1 per week manage portion size  Plan:  Ask your doctor about recommendations for self-monitoring your blood sugars at home Telephone follow up appointment with care management team member scheduled for:  Friday, July 11 at 11:00 AM         Please call 1-800-273-TALK (toll free, 24 hour hotline) if you are experiencing a Mental Health or Behavioral Health Crisis or need someone to talk to.  Patient verbalizes understanding of instructions and care plan provided today and agrees to view in MyChart. Active MyChart status and patient understanding of how to access instructions and care plan via MyChart confirmed with patient.     Louanne Roussel RN BSN CCM [place]  [facility], [facility] Nurse Care Coordinator  Direct Dial: [phone] Website: [email or web address]

## 2023-07-11 ENCOUNTER — Ambulatory Visit: Attending: Cardiology

## 2023-07-11 DIAGNOSIS — G4719 Other hypersomnia: Secondary | ICD-10-CM

## 2023-07-11 DIAGNOSIS — I5032 Chronic diastolic (congestive) heart failure: Secondary | ICD-10-CM

## 2023-07-11 DIAGNOSIS — I480 Paroxysmal atrial fibrillation: Secondary | ICD-10-CM

## 2023-07-11 NOTE — Procedures (Signed)
    SLEEP STUDY REPORT Patient Information Study Date: 07/04/2023 Patient Name: [name] Patient ID: [id number] Birth Date: 1951-01-03 Age: 73 Gender: Male BMI: 32.5 (W=207 lb, H=5' 7'') Stopbang: 5 Referring Physician: Hettie Lota, DO  TEST DESCRIPTION: Home sleep apnea testing was completed using the WatchPat, a Type 1 device, utilizing peripheral arterial tonometry (PAT), chest movement, actigraphy, pulse oximetry, pulse rate, body position and snore. AHI was calculated with apnea and hypopnea using valid sleep time as the denominator. RDI includes apneas, hypopneas, and RERAs. The data acquired and the scoring of sleep and all associated events were performed in accordance with the recommended standards and specifications as outlined in the AASM Manual for the Scoring of Sleep and Associated Events 2.2.0 (2015).  FINDINGS:  1. Severe Obstructive Sleep Apnea with AHI 29.5/hr.  2. No Central Sleep Apnea with pAHIc 6.5/hr.  3. Oxygen  desaturations as low as 79%.  4. Severe snoring was present. O2 sats were < 88% for 3.6 min.  5. Total sleep time was 3 hrs and 33 min.  6. 25.5% of total sleep time was spent in REM sleep.  7. Shortened sleep onset latency at 6 min.  8. Shortened REM sleep onset latency at 63 min.  9. Total awakenings were 47. 10. Arrhythmia detection: None  DIAGNOSIS: Severe Obstructive Sleep Apnea (G47.33)  RECOMMENDATIONS: 1. Clinical correlation of these findings is necessary. The decision to treat obstructive sleep apnea (OSA) is usually based on the presence of apnea symptoms or the presence of associated medical conditions such as Hypertension, Congestive Heart Failure, Atrial Fibrillation or Obesity. The most common symptoms of OSA are snoring, gasping for breath while sleeping, daytime sleepiness and fatigue. 2. Initiating apnea therapy is recommended given the presence of symptoms and/or associated conditions. Recommend proceeding with one of  the following:  a. Auto-CPAP therapy with a pressure range of 5-20cm H2O.  b. An oral appliance (OA) that can be obtained from certain dentists with expertise in sleep medicine. These are primarily of use in non-obese patients with mild and moderate disease.  c. An ENT consultation which may be useful to look for specific causes of obstruction and possible treatment options.  d. If patient is intolerant to PAP therapy, consider referral to ENT for evaluation for hypoglossal nerve stimulator. 3. Close follow-up is necessary to ensure success with CPAP or oral appliance therapy for maximum benefit . 4. A follow-up oximetry study on CPAP is recommended to assess the adequacy of therapy and determine the need for supplemental oxygen  or the potential need for Bi-level therapy. An arterial blood gas to determine the adequacy of baseline ventilation and oxygenation should also be considered. 5. Healthy sleep recommendations include: adequate nightly sleep (normal 7-9 hrs/night), avoidance of caffeine after noon and alcohol near bedtime, and maintaining a sleep environment that is cool, dark and quiet. 6. Weight loss for overweight patients is recommended. Even modest amounts of weight loss can significantly improve the severity of sleep apnea. 7. Snoring recommendations include: weight loss where appropriate, side sleeping, and avoidance of alcohol before bed. 8. Operation of motor vehicle should be avoided when sleepy.  Signature: Gaylyn Keas, MD; [facility]; Diplomat, American Board of Sleep Medicine Electronically Signed: 07/11/2023 11:02:32 AM

## 2023-07-12 ENCOUNTER — Other Ambulatory Visit: Payer: Self-pay | Admitting: Internal Medicine

## 2023-07-12 NOTE — Telephone Encounter (Signed)
 Okay to refill? If so how many refills?

## 2023-07-13 ENCOUNTER — Other Ambulatory Visit: Payer: Self-pay

## 2023-07-14 ENCOUNTER — Telehealth: Payer: Self-pay

## 2023-07-14 ENCOUNTER — Encounter: Payer: Self-pay | Admitting: Family Medicine

## 2023-07-14 ENCOUNTER — Telehealth (INDEPENDENT_AMBULATORY_CARE_PROVIDER_SITE_OTHER): Admitting: Family Medicine

## 2023-07-14 DIAGNOSIS — I5032 Chronic diastolic (congestive) heart failure: Secondary | ICD-10-CM

## 2023-07-14 DIAGNOSIS — G4719 Other hypersomnia: Secondary | ICD-10-CM

## 2023-07-14 DIAGNOSIS — G4733 Obstructive sleep apnea (adult) (pediatric): Secondary | ICD-10-CM | POA: Diagnosis not present

## 2023-07-14 DIAGNOSIS — I50810 Right heart failure, unspecified: Secondary | ICD-10-CM

## 2023-07-14 DIAGNOSIS — Z951 Presence of aortocoronary bypass graft: Secondary | ICD-10-CM

## 2023-07-14 DIAGNOSIS — I480 Paroxysmal atrial fibrillation: Secondary | ICD-10-CM

## 2023-07-14 DIAGNOSIS — I2510 Atherosclerotic heart disease of native coronary artery without angina pectoris: Secondary | ICD-10-CM

## 2023-07-14 DIAGNOSIS — I442 Atrioventricular block, complete: Secondary | ICD-10-CM

## 2023-07-14 NOTE — Addendum Note (Signed)
 Addended by: Marina Desire C on: 07/14/2023 11:18 AM   Modules accepted: Orders

## 2023-07-14 NOTE — Progress Notes (Signed)
   Subjective:    Patient ID: [name], male    DOB: 1951/02/02, 73 y.o.   MRN: [id number]  HPI Documentation for virtual audio and video telecommunications through Caregility encounter:  The patient was located at home. 2 patient identifiers used.  The provider was located in the office. The patient did consent to this visit and is aware of possible charges through their insurance for this visit.  The other persons participating in this telemedicine service were none. Time spent on call was 5 minutes and in review of previous records >20 minutes total for counseling and coordination of care.  This virtual service is not related to other E/M service within previous 7 days.  Today's visit is discussed sleep apnea.  Apparently he had a recent study done through his cardiologist.  I could not see the results of the study.  He is apparently scheduled to come in to the sleep lab for CPAP titration.  Review of Systems     Objective:    Physical Exam Alert and in no distress otherwise not examined       Assessment & Plan:  OSA (obstructive sleep apnea) I explained that under the circumstances I think is appropriate to go ahead and get titrated on the CPAP.  Explained that sometimes we need to go to was called BiPAP.  The sleep study and titrations to help determine that.  After this has been that we can talk about follow-up management of the CPAP.  I explained that because of his underlying cardiac condition, getting more specific on CPAP and proper management is appropriate.

## 2023-07-14 NOTE — Telephone Encounter (Addendum)
 Notified patient of sleep study results and recommendations. All questions were answered and patient verbalized understanding. CPAP Titration ordered today.

## 2023-07-14 NOTE — Telephone Encounter (Signed)
-----   Message from Gaylyn Keas sent at 07/11/2023 11:04 AM EDT ----- Please let patient know that they have sleep apnea.  Recommend therapeutic CPAP titration for treatment of patient's sleep disordered breathing.

## 2023-07-19 ENCOUNTER — Other Ambulatory Visit (HOSPITAL_COMMUNITY): Payer: Self-pay | Admitting: Cardiology

## 2023-07-21 ENCOUNTER — Other Ambulatory Visit: Payer: Self-pay | Admitting: Family Medicine

## 2023-07-21 DIAGNOSIS — E782 Mixed hyperlipidemia: Secondary | ICD-10-CM

## 2023-07-29 DIAGNOSIS — C61 Malignant neoplasm of prostate: Secondary | ICD-10-CM | POA: Diagnosis not present

## 2023-08-05 ENCOUNTER — Other Ambulatory Visit (HOSPITAL_COMMUNITY): Payer: Self-pay | Admitting: Cardiology

## 2023-08-05 DIAGNOSIS — C61 Malignant neoplasm of prostate: Secondary | ICD-10-CM | POA: Diagnosis not present

## 2023-08-08 ENCOUNTER — Telehealth: Payer: Self-pay | Admitting: Family Medicine

## 2023-08-08 NOTE — Telephone Encounter (Signed)
 Copied from CRM [id number]. Topic: Clinical - Medication Refill >> Aug 08, 2023 12:10 PM Kevelyn M wrote: Medication: apixaban  (ELIQUIS ) 5 MG TABS tablet   Has the patient contacted their pharmacy? Yes (Agent: If no, request that the patient contact the pharmacy for the refill. If patient does not wish to contact the pharmacy document the reason why and proceed with request.) (Agent: If yes, when and what did the pharmacy advise?)  This is the patient's preferred pharmacy:  [facility] [place], [place] - [address] [place] [address] Phone: [phone] Fax: [phone]  Is this the correct pharmacy for this prescription? Yes If no, delete pharmacy and type the correct one.   Has the prescription been filled recently? No  Is the patient out of the medication? Yes  Has the patient been seen for an appointment in the last year OR does the patient have an upcoming appointment? Yes  Can we respond through MyChart? Yes but he said Dr. Joyce isn't on his list  Agent: Please be advised that Rx refills may take up to 3 business days. We ask that you follow-up with your pharmacy.

## 2023-08-09 ENCOUNTER — Other Ambulatory Visit: Payer: Self-pay

## 2023-08-09 ENCOUNTER — Other Ambulatory Visit (HOSPITAL_COMMUNITY): Payer: Self-pay

## 2023-08-09 ENCOUNTER — Ambulatory Visit: Admitting: Internal Medicine

## 2023-08-09 DIAGNOSIS — I330 Acute and subacute infective endocarditis: Secondary | ICD-10-CM

## 2023-08-09 MED ORDER — AMOXICILLIN 500 MG PO CAPS: 500.0000 mg | ORAL_CAPSULE | Freq: Three times a day (TID) | ORAL | 5 refills | Status: DC

## 2023-08-09 NOTE — Progress Notes (Unsigned)
 Patient Active Problem List   Diagnosis Date Noted   History of thymectomy 05/25/2023   Chronic diastolic heart failure (HCC) 06/18/2022   Endocarditis 06/17/2022   History of CVA (cerebrovascular accident) 06/17/2022   Ischemia of left upper extremity 06/15/2022   Arterial occlusion 06/15/2022   Brachial artery occlusion, left (HCC) 06/15/2022   Paroxysmal A-fib (HCC) 06/15/2022   Secondary hypercoagulable state (HCC) 06/15/2022   History of mitral valve repair 06/15/2022   Demand ischemia (HCC) 05/25/2022   Senile purpura (HCC) 05/18/2022   Atherosclerosis of aorta (HCC) 04/30/2019   Actinic keratosis 04/30/2019   Malignant neoplasm of prostate (HCC) 02/06/2019   Complete heart block (HCC) 01/18/2019   Pacemaker 01/18/2019   Encounter for therapeutic drug monitoring 10/23/2018   H/O aortic valve replacement 10/11/2018   S/P CABG x 2 10/11/2018   History of mitral valve replacement 10/11/2018   Incidental pulmonary nodule, > 3mm and < 8mm 10/04/2018   Coronary artery disease involving native coronary artery of native heart without angina pectoris    Mixed hyperlipidemia 06/15/2017   Hypogonadism in male 11/05/2015   Former smoker 12/27/2011   Obesity (BMI 30-39.9) 12/27/2011   History of esophageal stricture 12/27/2011    Patient's Medications  New Prescriptions   No medications on file  Previous Medications   ACETAMINOPHEN  (TYLENOL ) 325 MG TABLET    Take 2 tablets (650 mg total) by mouth every 4 (four) hours as needed for moderate pain.   AMMONIUM LACTATE (AMLACTIN) 12 % CREAM    Apply 1 Application topically as needed for dry skin. Per dermatologist with [facility]   AMOXICILLIN  (AMOXIL ) 500 MG CAPSULE    TAKE 1 CAPSULE BY MOUTH THREE TIMES A DAY   ATORVASTATIN  (LIPITOR) 20 MG TABLET    TAKE 1 TABLET BY MOUTH EVERY DAY   CETIRIZINE (ZYRTEC) 10 MG TABLET    Take 10 mg by mouth daily.   CLOTRIMAZOLE  (LOTRIMIN ) 1 % CREAM    Apply 1 Application  topically as needed.   DIGOXIN  (LANOXIN ) 0.125 MG TABLET    TAKE 1 TABLET BY MOUTH EVERY DAY   ELIQUIS  5 MG TABS TABLET    TAKE 1 TABLET BY MOUTH 2 TIMES A DAY   EMPAGLIFLOZIN  (JARDIANCE ) 25 MG TABS TABLET    Take 1 tablet (25 mg total) by mouth daily before breakfast.   FUROSEMIDE  (LASIX ) 20 MG TABLET    Take 1 tablet (20 mg total) by mouth daily. Patient is taking every other day.   METOPROLOL  SUCCINATE (TOPROL -XL) 100 MG 24 HR TABLET    Take 1 tablet (100 mg total) by mouth daily. Take with or immediately following a meal.   OMEPRAZOLE  (PRILOSEC  OTC) 20 MG TABLET    Take 1 tablet (20 mg total) by mouth daily.   OXYMETAZOLINE (AFRIN) 0.05 % NASAL SPRAY    Place 1 spray into both nostrils at bedtime as needed for congestion.   POLYETHYLENE GLYCOL POWDER (GLYCOLAX /MIRALAX ) 17 GM/SCOOP POWDER    Take 17 g by mouth daily as needed for mild constipation.   SPIRONOLACTONE  (ALDACTONE ) 25 MG TABLET    Take 1 tablet (25 mg total) by mouth daily. NEEDS FOLLOW UP APPOINTMENT FOR MORE REFILLS   TRIAMCINOLONE  CREAM (KENALOG ) 0.1 %    Apply 1 Application topically 2 (two) times daily.  Modified Medications   No medications on file  Discontinued Medications   No medications on file    Subjective: 73 y.o. with PMHx  as below and with bioprosthetic AVR and by bioprosthetic MVR admitted with acute thrombus to left lower extremity requiring urgent surgery presents for follow-up on E faecalis bacteremia . Discharged on IV abc x 6 weeks.    07/12/22: Doing well tolerating abx. Deny fevers. States takin yogurt with abx.  08/19/22: tolerating abx.  11/23/22. Pt noted a pruiritc rash aobut a month ago that comes and goes all over his body. Coninues to take amox. 12/16 : Discussed the use of AI scribe software for clinical note transcription with the patient, who gave verbal consent to proceed.   The patient, with a history of dermatitis, presents for follow-up after a recent biopsy. He reports that the rash has  resolved, leaving only scar tissue. 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 He was previously treated with a topical steroid cream, which he has since discontinued. He also received a steroid injection, which provided relief.   In addition to the dermatitis, the patient has been on amoxicillin , which he tolerates well. He also has a history of elevated creatinine levels, which were noted during a recent visit with his cardiologist. The patient has not taken Lasix  for the past two days.    Today 08/09/23: PT states he ahs been nausios since February. He states he is nauseous most of the day.  Review of Systems: Review of Systems  All other systems reviewed and are negative.   Past Medical History:  Diagnosis Date   BCE (basal cell epithelioma), arm    RIGHT SHOULDER   Bradycardia    Bumps on skin    on left side of nose for last 3 weeks   CAD (coronary artery disease)    a. s/p CABGx2 10/11/18 LIMA to LAD, SVG to RCA, EVH via right thigh.   Dyslipidemia    Dysrhythmia    aflutter/afib   Ectatic thoracic aorta (HCC)    Esophageal stricture    GERD (gastroesophageal reflux disease)    Heart murmur    sees dr jerrald   Hyperlipidemia    Incidental pulmonary nodule, > 3mm and < 8mm 10/04/2018   Noted on CTA   Inguinal hernia    bilateral   Obesity    Presence of permanent cardiac pacemaker    Prostate cancer (HCC)    S/P aortic valve replacement with bioprosthetic valve 10/11/2018   23 mm Edwards Inspiris Resilia stented bovine pericardial tissue valve, miltral valve done also   S/P CABG x 2 10/11/2018   LIMA to LAD, SVG to RCA, EVH via right thigh   S/P mitral valve replacement with bioprosthetic valve 10/11/2018   27 mm Medtronic Mosaic stented porcine bioprosthetic tissue valve   S/P patent foramen ovale closure 10/11/2018   S/P placement of cardiac pacemaker    Smoker    Stroke [facility])     Social History   Tobacco Use   Smoking status: Former     Average packs/day: 0.3 packs/day for 40.6 years (14.0 ttl pk-yrs)    Types: Cigarettes    Start date: 02/08/1978    Passive exposure: Never   Smokeless tobacco: Never  Vaping Use   Vaping status: Never Used  Substance Use Topics   Alcohol use: Yes    Alcohol/week: 1.0 standard drink of alcohol    Types: 1 Cans of beer per week    Comment: rare   Drug use: No    Family History  Problem Relation Age of Onset   Colon cancer Mother  Lung cancer Father        smoker   CAD Neg Hx    Pancreatic cancer Neg Hx    Breast cancer Neg Hx     Allergies  Allergen Reactions   Losartan  Rash    Health Maintenance  Topic Date Due   Diabetic kidney evaluation - Urine ACR  Never done   COVID-19 Vaccine (4 - 2024-25 season) 10/10/2022   INFLUENZA VACCINE  09/09/2023   Medicare Annual Wellness (AWV)  05/24/2024   Diabetic kidney evaluation - eGFR measurement  06/29/2024   Colonoscopy  03/21/2033   Pneumococcal Vaccine: 50+ Years  Completed   Hepatitis C Screening  Completed   Zoster Vaccines- Shingrix  Completed   Hepatitis B Vaccines  Aged Out   HPV VACCINES  Aged Out   Meningococcal B Vaccine  Aged Out   DTaP/Tdap/Td  Discontinued    Objective:  There were no vitals filed for this visit. There is no height or weight on file to calculate BMI.  Physical Exam Constitutional:      General: He is not in acute distress.    Appearance: He is normal weight. He is not toxic-appearing.  HENT:     Head: Normocephalic and atraumatic.     Right Ear: External ear normal.     Left Ear: External ear normal.     Nose: No congestion or rhinorrhea.     Mouth/Throat:     Mouth: Mucous membranes are moist.     Pharynx: Oropharynx is clear.  Eyes:     Extraocular Movements: Extraocular movements intact.     Conjunctiva/sclera: Conjunctivae normal.     Pupils: Pupils are equal, round, and reactive to light.  Cardiovascular:     Rate and Rhythm: Normal rate and regular rhythm.     Heart  sounds: No murmur heard.    No friction rub. No gallop.  Pulmonary:     Effort: Pulmonary effort is normal.     Breath sounds: Normal breath sounds.  Abdominal:     General: Abdomen is flat. Bowel sounds are normal.     Palpations: Abdomen is soft.  Musculoskeletal:        General: No swelling. Normal range of motion.     Cervical back: Normal range of motion and neck supple.  Skin:    General: Skin is warm and dry.  Neurological:     General: No focal deficit present.     Mental Status: He is oriented to person, place, and time.  Psychiatric:        Mood and Affect: Mood normal.    Physical Exam   Lab Results Lab Results  Component Value Date   WBC 9.6 04/09/2023   HGB 16.2 04/09/2023   HCT 50.1 04/09/2023   MCV 88.5 04/09/2023   PLT 206 04/09/2023    Lab Results  Component Value Date   CREATININE 1.06 06/30/2023   BUN 13 06/30/2023   NA 136 06/30/2023   K 4.5 06/30/2023   CL 100 06/30/2023   CO2 26 06/30/2023    Lab Results  Component Value Date   ALT 20 01/24/2023   AST 18 01/24/2023   ALKPHOS 62 01/20/2023   BILITOT 1.0 01/24/2023    Lab Results  Component Value Date   CHOL 100 11/19/2022   HDL 35 (L) 11/19/2022   LDLCALC 50 11/19/2022   TRIG 74 11/19/2022   CHOLHDL 2.9 11/19/2022   No results found for: LABRPR, RPRTITER No results found for:  HIV1RNAQUANT, HIV1RNAVL, CD4TABS   Problem List Items Addressed This Visit   None  Results   Assessment/Plan #E faecalis bacteremia with bioprosthetic mitral valve and 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 Sheree, vascular surgery. Vascular surgery following -TEE  showed large vegetation encompassing majority of mitral valve  leaflet, aortic bovine valve with small subcentimeter vegetation. -Seen by CTS Dr. Shyrl, noted that given all his comorbidities the likelihood of subsequent surgical transfer limited.  Recommended medical management and antibiotic therapy at this point. -Discharged on Ampicillin  12 gm q24h continous infusion and ctx 2 gm q12h  x 6 weeks->amox 1gm tid  started on 6/20 fo rpO suppression. I opted for higher dose suppresion given 2 bioprosthetic valce involvement -TTE on 5/28 showed redemonstration of mitral valve prosthetic vegetation and LV chordal redundancy versus likely vegetation.  TTE on 7/5 showed smaller mobile elements on prosthetic mitral valve, abnormal thickness seen on prosthetic aortic valve. TTE on 02/01/23 noted no large vegetation seen, MV prosthesis gradient improved -01/14/23 lab stable Plan:  -Continue amox 500 tid -labs today -F/U in 6 months    #Nausea #Cough -15 lb weight loss since February. He has had similar symtoms in the past -Colonoscopy in February. Seems c/w GERD, currently on prilosec  -F/U gi if symptoms persist   #AKI Resolved on 06/30/23 labs      Loney Stank, MD [facility] 08/09/2023, 2:29 PM   I have personally spent 46 minutes involved in face-to-face and non-face-to-face activities for this patient on the day of the visit. Professional time spent includes the following activities: Preparing to see the patient (review of tests), Obtaining and/or reviewing separately obtained history (admission/discharge record), Performing a medically appropriate examination and/or evaluation , Ordering medications/tests/procedures, referring and communicating with other health care professionals, Documenting clinical information in the EMR, Independently interpreting results (not separately reported), Communicating results to the patient/family/caregiver, Counseling and educating the patient/family/caregiver  and Care coordination (not separately reported).

## 2023-08-10 ENCOUNTER — Other Ambulatory Visit (HOSPITAL_COMMUNITY): Payer: Self-pay

## 2023-08-11 ENCOUNTER — Telehealth (HOSPITAL_COMMUNITY): Payer: Self-pay

## 2023-08-11 ENCOUNTER — Other Ambulatory Visit (HOSPITAL_COMMUNITY): Payer: Self-pay

## 2023-08-11 ENCOUNTER — Other Ambulatory Visit (HOSPITAL_COMMUNITY): Payer: Self-pay | Admitting: Cardiology

## 2023-08-11 NOTE — Telephone Encounter (Signed)
 Advanced Heart Failure Patient Advocate Encounter  Prior shara was started and invalid for Eliquis , as there is an active request on file that I am unable to locate or access. Contacted insurance by phone, confirmed pt has a transcatheter aortic valve replacement with a BIOPROSTHETIC valve. Patient does NOT have a mechanical heart valve, therefore eliquis  is appropriate to continue.   Insurance will continue to review this claim and send determination letter via fax. Will continue to follow for updates.  Rachel DEL, CPhT Rx Patient Advocate Phone: [phone]

## 2023-08-11 NOTE — Telephone Encounter (Signed)
 Prior authorization for Eliquis  was approved from 08/10/2023 until 02/08/2024.  Contacted pharmacy to reprocess, confirmed $0 copay for 90 days. Approval letter attached to chart.

## 2023-08-14 LAB — CULTURE, BLOOD (SINGLE)
MICRO NUMBER:: 16647419
Result:: NO GROWTH
SPECIMEN QUALITY:: ADEQUATE

## 2023-08-14 LAB — COMPLETE METABOLIC PANEL WITHOUT GFR
AG Ratio: 1 (calc) (ref 1.0–2.5)
ALT: 12 U/L (ref 9–46)
AST: 15 U/L (ref 10–35)
Albumin: 3.9 g/dL (ref 3.6–5.1)
Alkaline phosphatase (APISO): 93 U/L (ref 35–144)
BUN: 17 mg/dL (ref 7–25)
CO2: 24 mmol/L (ref 20–32)
Calcium: 9.3 mg/dL (ref 8.6–10.3)
Chloride: 100 mmol/L (ref 98–110)
Creat: 1.1 mg/dL (ref 0.70–1.28)
Globulin: 3.9 g/dL — ABNORMAL HIGH (ref 1.9–3.7)
Glucose, Bld: 137 mg/dL — ABNORMAL HIGH (ref 65–99)
Potassium: 4.2 mmol/L (ref 3.5–5.3)
Sodium: 135 mmol/L (ref 135–146)
Total Bilirubin: 1 mg/dL (ref 0.2–1.2)
Total Protein: 7.8 g/dL (ref 6.1–8.1)

## 2023-08-14 LAB — CBC WITH DIFFERENTIAL/PLATELET
Absolute Lymphocytes: 531 {cells}/uL — ABNORMAL LOW (ref 850–3900)
Absolute Monocytes: 922 {cells}/uL (ref 200–950)
Basophils Absolute: 70 {cells}/uL (ref 0–200)
Basophils Relative: 0.8 %
Eosinophils Absolute: 357 {cells}/uL (ref 15–500)
Eosinophils Relative: 4.1 %
HCT: 42.5 % (ref 38.5–50.0)
Hemoglobin: 13.7 g/dL (ref 13.2–17.1)
MCH: 26.4 pg — ABNORMAL LOW (ref 27.0–33.0)
MCHC: 32.2 g/dL (ref 32.0–36.0)
MCV: 82 fL (ref 80.0–100.0)
MPV: 11.1 fL (ref 7.5–12.5)
Monocytes Relative: 10.6 %
Neutro Abs: 6821 {cells}/uL (ref 1500–7800)
Neutrophils Relative %: 78.4 %
Platelets: 277 Thousand/uL (ref 140–400)
RBC: 5.18 Million/uL (ref 4.20–5.80)
RDW: 14.4 % (ref 11.0–15.0)
Total Lymphocyte: 6.1 %
WBC: 8.7 Thousand/uL (ref 3.8–10.8)

## 2023-08-15 ENCOUNTER — Ambulatory Visit (INDEPENDENT_AMBULATORY_CARE_PROVIDER_SITE_OTHER): Payer: Medicare HMO

## 2023-08-15 DIAGNOSIS — I480 Paroxysmal atrial fibrillation: Secondary | ICD-10-CM

## 2023-08-15 LAB — CULTURE, BLOOD (SINGLE)
MICRO NUMBER:: 16647420
Result:: NO GROWTH
SPECIMEN QUALITY:: ADEQUATE

## 2023-08-17 LAB — CUP PACEART REMOTE DEVICE CHECK
Battery Remaining Longevity: 50 mo
Battery Voltage: 2.95 V
Brady Statistic AP VP Percent: 0.24 %
Brady Statistic AP VS Percent: 0 %
Brady Statistic AS VP Percent: 90.84 %
Brady Statistic AS VS Percent: 8.92 %
Brady Statistic RA Percent Paced: 0.35 %
Brady Statistic RV Percent Paced: 91.07 %
Date Time Interrogation Session: 20250706214349
Implantable Lead Connection Status: 753985
Implantable Lead Connection Status: 753985
Implantable Lead Implant Date: 20200908
Implantable Lead Implant Date: 20200908
Implantable Lead Location: 753859
Implantable Lead Location: 753860
Implantable Lead Model: 3830
Implantable Lead Model: 5076
Implantable Pulse Generator Implant Date: 20200908
Lead Channel Impedance Value: 266 Ohm
Lead Channel Impedance Value: 323 Ohm
Lead Channel Impedance Value: 361 Ohm
Lead Channel Impedance Value: 437 Ohm
Lead Channel Pacing Threshold Amplitude: 0.5 V
Lead Channel Pacing Threshold Amplitude: 1 V
Lead Channel Pacing Threshold Pulse Width: 0.4 ms
Lead Channel Pacing Threshold Pulse Width: 0.4 ms
Lead Channel Sensing Intrinsic Amplitude: 0.875 mV
Lead Channel Sensing Intrinsic Amplitude: 0.875 mV
Lead Channel Sensing Intrinsic Amplitude: 10.125 mV
Lead Channel Sensing Intrinsic Amplitude: 10.125 mV
Lead Channel Setting Pacing Amplitude: 1.5 V
Lead Channel Setting Pacing Amplitude: 2.5 V
Lead Channel Setting Pacing Pulse Width: 0.8 ms
Lead Channel Setting Sensing Sensitivity: 1.2 mV
Zone Setting Status: 755011
Zone Setting Status: 755011

## 2023-08-18 ENCOUNTER — Ambulatory Visit (HOSPITAL_COMMUNITY)
Admission: RE | Admit: 2023-08-18 | Discharge: 2023-08-18 | Disposition: A | Discharge status: Home / Self Care | Source: Ambulatory Visit | Attending: Cardiology | Admitting: Cardiology

## 2023-08-18 DIAGNOSIS — I050 Rheumatic mitral stenosis: Secondary | ICD-10-CM | POA: Diagnosis not present

## 2023-08-18 DIAGNOSIS — I071 Rheumatic tricuspid insufficiency: Secondary | ICD-10-CM | POA: Diagnosis not present

## 2023-08-18 DIAGNOSIS — I5032 Chronic diastolic (congestive) heart failure: Secondary | ICD-10-CM | POA: Diagnosis not present

## 2023-08-18 LAB — ECHOCARDIOGRAM COMPLETE
AR max vel: 2.96 cm2
AV Area VTI: 2.75 cm2
AV Area mean vel: 2.73 cm2
AV Mean grad: 12 mmHg
AV Peak grad: 22.5 mmHg
Ao pk vel: 2.37 m/s
Area-P 1/2: 5.38 cm2
Calc EF: 57.9 %
MV VTI: 2.5 cm2
S' Lateral: 3.2 cm
Single Plane A2C EF: 58.4 %
Single Plane A4C EF: 56.9 %

## 2023-08-19 ENCOUNTER — Other Ambulatory Visit: Payer: Self-pay

## 2023-08-19 NOTE — Patient Outreach (Signed)
 Complex Care Management   Visit Note  08/19/2023  Name:  [name] MRN: [id number] DOB: 28-Sep-1950  Situation: Referral received for Complex Care Management related to Atrial Fibrillation, CAD, CHF, and s/p valve replacement with history of acute endocarditis, nausea, fatigue, Pacemaker, DOE. I obtained verbal consent from Patient.  Visit completed with patient on the phone.  Background:   Past Medical History:  Diagnosis Date   BCE (basal cell epithelioma), arm    RIGHT SHOULDER   Bradycardia    Bumps on skin    on left side of nose for last 3 weeks   CAD (coronary artery disease)    a. s/p CABGx2 10/11/18 LIMA to LAD, SVG to RCA, EVH via right thigh.   Dyslipidemia    Dysrhythmia    aflutter/afib   Ectatic thoracic aorta (HCC)    Esophageal stricture    GERD (gastroesophageal reflux disease)    Heart murmur    sees dr jerrald   Hyperlipidemia    Incidental pulmonary nodule, > 3mm and < 8mm 10/04/2018   Noted on CTA   Inguinal hernia    bilateral   Obesity    Presence of permanent cardiac pacemaker    Prostate cancer (HCC)    S/P aortic valve replacement with bioprosthetic valve 10/11/2018   23 mm Edwards Inspiris Resilia stented bovine pericardial tissue valve, miltral valve done also   S/P CABG x 2 10/11/2018   LIMA to LAD, SVG to RCA, EVH via right thigh   S/P mitral valve replacement with bioprosthetic valve 10/11/2018   27 mm Medtronic Mosaic stented porcine bioprosthetic tissue valve   S/P patent foramen ovale closure 10/11/2018   S/P placement of cardiac pacemaker    Smoker    Stroke [facility])     Assessment: Patient Reported Symptoms:  Cognitive Cognitive Status: Alert and oriented to person, place, and time Cognitive/Intellectual Conditions Management [RPT]: None reported or documented in medical history or problem list   Health Maintenance Behaviors: Annual physical exam, Healthy diet Health Facilitated by: Healthy diet  Neurological Neurological  Review of Symptoms: No symptoms reported    HEENT HEENT Symptoms Reported: No symptoms reported      Cardiovascular Cardiovascular Symptoms Reported: Fatigue Does patient have uncontrolled Hypertension?: No Cardiovascular Management Strategies: Medication therapy, Medical device, Routine screening, Diet modification Cardiovascular Self-Management Outcome: 4 (good)  Respiratory Respiratory Symptoms Reported: Shortness of breath Respiratory Management Strategies: Breathing exercise, Routine screening, Adequate rest (Educated patient re: deep breathing exercises) Respiratory Self-Management Outcome: 3 (uncertain)  Endocrine Endocrine Symptoms Reported: No symptoms reported Is patient diabetic?: Yes Is patient checking blood sugars at home?: No Endocrine Self-Management Outcome: 4 (good)  Gastrointestinal Gastrointestinal Symptoms Reported: Nausea Additional Gastrointestinal Details: patient educated re: the importance of eating a small snack when taking his antibiotic, discussed taking Prilosec  in the am verses at bedtime, educated patient re: peppermint essential oil for symptom managment Gastrointestinal Management Strategies: Medication therapy, Diet modification Gastrointestinal Self-Management Outcome: 3 (uncertain)    Genitourinary Genitourinary Symptoms Reported: No symptoms reported    Integumentary Integumentary Symptoms Reported: No symptoms reported    Musculoskeletal Musculoskelatal Symptoms Reviewed: No symptoms reported   Falls in the past year?: No Number of falls in past year: 1 or less Was there an injury with Fall?: No Fall Risk Category Calculator: 0 Patient Fall Risk Level: Low Fall Risk Patient at Risk for Falls Due to: No Fall Risks  Psychosocial Psychosocial Symptoms Reported: No symptoms reported     Quality of Family  Relationships: supportive, involved, helpful      08/19/2023   11:17 AM  Depression screen PHQ 2/9  Decreased Interest 0  Down, Depressed,  Hopeless 0  PHQ - 2 Score 0    There were no vitals filed for this visit.  Medications Reviewed Today     Reviewed by Morgan Clayborne CROME, RN (Registered Nurse) on 08/19/23 at 1116  Med List Status: <None>   Medication Order Taking? Sig Documenting Provider Last Dose Status Informant  acetaminophen  (TYLENOL ) 325 MG tablet [id number]  Take 2 tablets (650 mg total) by mouth every 4 (four) hours as needed for moderate pain. Patsy Lenis, MD  Active            Med Note RANELLE, KEENE HERO   Thu Jun 30, 2023  1:34 PM)    ammonium lactate (AMLACTIN) 12 % cream [id number]  Apply 1 Application topically as needed for dry skin. Per dermatologist with [facility] Tonnie Race, PA-C  Active   amoxicillin  (AMOXIL ) 500 MG capsule [id number]  Take 1 capsule (500 mg total) by mouth 3 (three) times daily. Dennise Kingsley, MD  Active   atorvastatin  (LIPITOR) 20 MG tablet [id number]  TAKE 1 TABLET BY MOUTH EVERY DAY [name], MD  Active   cetirizine (ZYRTEC) 10 MG tablet [id number]  Take 10 mg by mouth daily. [provider]  Active   clotrimazole  (LOTRIMIN ) 1 % cream [id number]  Apply 1 Application topically as needed. [provider]  Active   digoxin  (LANOXIN ) 0.125 MG tablet [id number]  TAKE 1 TABLET BY MOUTH EVERY DAY Sabharwal, Aditya, DO  Active   ELIQUIS  5 MG TABS tablet [id number]  TAKE 1 TABLET BY MOUTH 2 TIMES A DAY Sabharwal, Aditya, DO  Active   empagliflozin  (JARDIANCE ) 25 MG TABS tablet [id number]  Take 1 tablet (25 mg total) by mouth daily before breakfast. Lalonde, John C, MD  Active   furosemide  (LASIX ) 20 MG tablet [id number]  Take 1 tablet (20 mg total) by mouth daily. Patient is taking every other day. Sabharwal, Aditya, DO  Active   metoprolol  succinate (TOPROL -XL) 100 MG 24 hr tablet [id number]  TAKE 1 TABLET BY MOUTH EVERY DAY (TAKE WITH OR IMMEDIATELY FOLLOWING A MEAL) Sabharwal, Aditya, DO  Active   omeprazole  (PRILOSEC  OTC) 20 MG tablet [id number]  Take 1  tablet (20 mg total) by mouth daily. Elicia Claw, MD  Active   oxymetazoline (AFRIN) 0.05 % nasal spray [id number]  Place 1 spray into both nostrils at bedtime as needed for congestion. [provider]  Active Self, Pharmacy Records, Spouse/Significant Other           Med Note RANELLE KEENE HERO [place] Jun 30, 2023  1:37 PM)    polyethylene glycol powder (GLYCOLAX /MIRALAX ) 17 GM/SCOOP powder [id number]  Take 17 g by mouth daily as needed for mild constipation. Akula, Vijaya, MD  Active Self, Pharmacy Records, Spouse/Significant Other           Med Note RANELLE KEENE HERO [place] Jun 30, 2023  1:36 PM)    spironolactone  (ALDACTONE ) 25 MG tablet [id number]  Take 1 tablet (25 mg total) by mouth daily. NEEDS FOLLOW UP APPOINTMENT FOR MORE REFILLS Sabharwal, Aditya, DO  Active   triamcinolone  cream (KENALOG ) 0.1 % [id number]  Apply 1 Application topically 2 (two) times daily. [provider]  Active             Recommendation:   Continue Current Plan of Care  Follow  Up Plan:   Telephone follow up appointment date/time:  Monday, August 11 at 11:00 AM  Clayborne Ly RN BSN CCM [facility]  [facility], [place] [facility] Nurse Care Coordinator  Direct Dial: [phone] Website: [email or web address]

## 2023-08-20 ENCOUNTER — Ambulatory Visit: Payer: Self-pay | Admitting: Cardiology

## 2023-08-22 NOTE — Patient Instructions (Signed)
 Visit Information  Thank you for taking time to visit with me today. Please don't hesitate to contact me if I can be of assistance to you before our next scheduled appointment.  Your next care management appointment is by telephone on Monday, August 11 at 11:00 AM  Please call the care guide team at [phone] if you need to cancel, schedule, or reschedule an appointment.   Please call 1-800-273-TALK (toll free, 24 hour hotline) if you are experiencing a Mental Health or Behavioral Health Crisis or need someone to talk to.  Clayborne Ly RN BSN CCM [place]  [facility], [facility] Nurse Care Coordinator  Direct Dial: [phone] Website: [email or web address]

## 2023-08-25 ENCOUNTER — Ambulatory Visit: Payer: Self-pay

## 2023-08-25 NOTE — Telephone Encounter (Signed)
 FYI Only or Action Required?: FYI only for provider.  Patient was last seen in primary care on 07/14/2023 by [name], MD.  Called Nurse Triage reporting Nausea.  Symptoms began several months ago.  Interventions attempted: Prescription medications: Ondansetron  4mg  .  Symptoms are: stable.  Triage Disposition: See PCP When Office is Open (Within 3 Days)  Patient/caregiver understands and will follow disposition?: Yes  Pt states he has ongoing nausea since had colonoscopy and endo done in Feb. Nothing OTC helps. Wife gave him some Ondansetron  4mg  today and it did help so pt asking for rx for himself. Scheduled VV tomorrow at 0945.   Copied from CRM [id number]. Topic: Clinical - Red Word Triage >> Aug 25, 2023 11:17 AM Marissa P wrote: Red Word that prompted transfer to Nurse Triage: Patient has nausea and slight shortness of breathe. Would like some medication please, tried his wife meds Ondansetron  4mg . Reason for Disposition  Nausea lasts > 1 week  Answer Assessment - Initial Assessment Questions 1. NAUSEA SEVERITY: How bad is the nausea? (e.g., mild, moderate, severe; dehydration, weight loss)     Mild moderate  2. ONSET: When did the nausea begin?     Ongoing since Feb 3. VOMITING: Any vomiting? If Yes, ask: How many times today?     no 4. RECURRENT SYMPTOM: Have you had nausea before? If Yes, ask: When was the last time? What happened that time?       Wife gave pt Ondansetron  4mg   Protocols used: Nausea-A-AH

## 2023-08-26 ENCOUNTER — Telehealth (INDEPENDENT_AMBULATORY_CARE_PROVIDER_SITE_OTHER): Admitting: Medical

## 2023-08-26 VITALS — BP 119/75 | HR 92 | Wt 189.0 lb

## 2023-08-26 DIAGNOSIS — I5032 Chronic diastolic (congestive) heart failure: Secondary | ICD-10-CM | POA: Diagnosis not present

## 2023-08-26 DIAGNOSIS — R5383 Other fatigue: Secondary | ICD-10-CM | POA: Diagnosis not present

## 2023-08-26 DIAGNOSIS — Z8679 Personal history of other diseases of the circulatory system: Secondary | ICD-10-CM

## 2023-08-26 DIAGNOSIS — R110 Nausea: Secondary | ICD-10-CM

## 2023-08-26 DIAGNOSIS — Z8546 Personal history of malignant neoplasm of prostate: Secondary | ICD-10-CM | POA: Diagnosis not present

## 2023-08-26 DIAGNOSIS — R630 Anorexia: Secondary | ICD-10-CM | POA: Diagnosis not present

## 2023-08-26 MED ORDER — ONDANSETRON 4 MG PO TBDP
4.0000 mg | ORAL_TABLET | Freq: Three times a day (TID) | ORAL | 1 refills | Status: DC | PRN
Start: 2023-08-26 — End: 2023-09-15

## 2023-08-26 NOTE — Progress Notes (Signed)
 Subjective:     Patient ID: [name], male   DOB: [birth year], 73 y.o.   MRN: [id number]  This visit type was conducted due to national recommendations for restrictions regarding the COVID-19 Pandemic (e.g. social distancing) in an effort to limit this patient's exposure and mitigate transmission in our community.  Due to their co-morbid illnesses, this patient is at least at moderate risk for complications without adequate follow up.  This format is felt to be most appropriate for this patient at this time.    Documentation for virtual audio and video telecommunications through [place] [facility] encounter:  The patient was located at home. The provider was located in the office. The patient did consent to this visit and is aware of possible charges through their insurance for this visit.  The other persons participating in this telemedicine service were none. Time spent on call was 20 minutes and in review of previous records 20 minutes total.  This virtual service is not related to other E/M service within previous 7 days.   HPI Chief Complaint  Patient presents with   Acute Visit    Ongoing nausea x February, constant,  Wife has zofran  4mg  that he has taken to help, taking them a couple times a dya   Been having nausea for a few months on and off.  Been using some of wife's zofran .  This seems to help.  He needs a refill on the Zofran  or to have his on Zofran .  Lately having less appetite.  No current abdominal pain  Has some SOB.  Weak and tired a lot.  SOB and weakness going on for over a year since endocarditis last year.   BPs run good at home.    He has talked to his cardiologist and PCP as well about his ongoing nausea.  He had endoscopy and colonoscopy 03/2023 with Dr. Layla Lah, GI.  On long term amoxicillin  since endocarditis.  No stool changes.  Has daily BMs.  No urinary issues.    No fever, no night sweats, no chills.  Drinks occasional alcohol.  Nothing  heavy  No other aggravating or relieving factors. No other complaint.    Past Medical History:  Diagnosis Date   BCE (basal cell epithelioma), arm    RIGHT SHOULDER   Bradycardia    Bumps on skin    on left side of nose for last 3 weeks   CAD (coronary artery disease)    a. s/p CABGx2 10/11/18 LIMA to LAD, SVG to RCA, EVH via right thigh.   Dyslipidemia    Dysrhythmia    aflutter/afib   Ectatic thoracic aorta (HCC)    Esophageal stricture    GERD (gastroesophageal reflux disease)    Heart murmur    sees dr jerrald   Hyperlipidemia    Incidental pulmonary nodule, > 3mm and < 8mm 10/04/2018   Noted on CTA   Inguinal hernia    bilateral   Obesity    Presence of permanent cardiac pacemaker    Prostate cancer (HCC)    S/P aortic valve replacement with bioprosthetic valve 10/11/2018   23 mm Edwards Inspiris Resilia stented bovine pericardial tissue valve, miltral valve done also   S/P CABG x 2 10/11/2018   LIMA to LAD, SVG to RCA, EVH via right thigh   S/P mitral valve replacement with bioprosthetic valve 10/11/2018   27 mm Medtronic Mosaic stented porcine bioprosthetic tissue valve   S/P patent foramen ovale closure 10/11/2018   S/P placement  of cardiac pacemaker    Smoker    Stroke [facility])    Current Outpatient Medications on File Prior to Visit  Medication Sig Dispense Refill   acetaminophen  (TYLENOL ) 325 MG tablet Take 2 tablets (650 mg total) by mouth every 4 (four) hours as needed for moderate pain.     amoxicillin  (AMOXIL ) 500 MG capsule Take 1 capsule (500 mg total) by mouth 3 (three) times daily. 90 capsule 5   atorvastatin  (LIPITOR) 20 MG tablet TAKE 1 TABLET BY MOUTH EVERY DAY 90 tablet 3   cetirizine (ZYRTEC) 10 MG tablet Take 10 mg by mouth daily.     ELIQUIS  5 MG TABS tablet TAKE 1 TABLET BY MOUTH 2 TIMES A DAY 60 tablet 11   empagliflozin  (JARDIANCE ) 25 MG TABS tablet Take 1 tablet (25 mg total) by mouth daily before breakfast. 90 tablet 1   furosemide  (LASIX )  20 MG tablet Take 1 tablet (20 mg total) by mouth daily. Patient is taking every other day. 90 tablet 0   metoprolol  succinate (TOPROL -XL) 100 MG 24 hr tablet TAKE 1 TABLET BY MOUTH EVERY DAY (TAKE WITH OR IMMEDIATELY FOLLOWING A MEAL) 90 tablet 3   omeprazole  (PRILOSEC  OTC) 20 MG tablet Take 1 tablet (20 mg total) by mouth daily. 90 tablet 3   polyethylene glycol powder (GLYCOLAX /MIRALAX ) 17 GM/SCOOP powder Take 17 g by mouth daily as needed for mild constipation. 238 g 0   spironolactone  (ALDACTONE ) 25 MG tablet Take 1 tablet (25 mg total) by mouth daily. NEEDS FOLLOW UP APPOINTMENT FOR MORE REFILLS 90 tablet 0   digoxin  (LANOXIN ) 0.125 MG tablet TAKE 1 TABLET BY MOUTH EVERY DAY 90 tablet 3   oxymetazoline (AFRIN) 0.05 % nasal spray Place 1 spray into both nostrils at bedtime as needed for congestion. (Patient not taking: Reported on 08/26/2023)     No current facility-administered medications on file prior to visit.    Review of Systems As in subjective    Objective:   Physical Exam Due to coronavirus pandemic stay at home measures, patient visit was virtual and they were not examined in person.   BP 119/75   Pulse 92   Wt 189 lb (85.7 kg)   BMI 29.60 kg/m   Wt Readings from Last 3 Encounters:  08/26/23 189 lb (85.7 kg)  07/05/23 206 lb (93.4 kg)  06/30/23 206 lb 9.6 oz (93.7 kg)   BP Readings from Last 3 Encounters:  08/26/23 119/75  07/05/23 120/70  06/30/23 120/70   Gen: wd, wn ,nad Psych: Pleasant, answers questions appropriately      Assessment:     Encounter Diagnoses  Name Primary?   Chronic nausea Yes   Fatigue, unspecified type    Decrease in appetite    Chronic diastolic heart failure (HCC)    History of prostate cancer    History of endocarditis        Plan:     We discussed his ongoing symptoms of chronic nausea for the last few months as well as a recent change in appetite and some noted weight loss.  We discussed potential causes.  He sees  urology.  I reviewed recent June 2025 urology notes.  PSA was still undetectable and things are stable from their standpoint.  He has a history of treatment for prostate cancer.  I reviewed the endoscopy and colonoscopy he had February 2025.  No cancer findings but he did have findings of Barrett's esophagus and some polyps  He is on several  medicines and has history of heart failure, history of endocrine carditis last year, on chronic antibiotics  We will have him come in next week for labs.  Pending labs we may end up having him see gastroenterology or possibly pursuing CT abdomen pelvis  Assad was seen today for acute visit.  Diagnoses and all orders for this visit:  Chronic nausea -     TSH + free T4; Future -     Lipase; Future -     CBC with Differential/Platelet; Future -     Comprehensive metabolic panel with GFR; Future -     Vitamin B12; Future  Fatigue, unspecified type -     TSH + free T4; Future -     Lipase; Future -     CBC with Differential/Platelet; Future -     Comprehensive metabolic panel with GFR; Future -     Vitamin B12; Future  Decrease in appetite -     TSH + free T4; Future -     Lipase; Future -     CBC with Differential/Platelet; Future -     Comprehensive metabolic panel with GFR; Future -     Vitamin B12; Future  Chronic diastolic heart failure (HCC)  History of prostate cancer  History of endocarditis  Other orders -     ondansetron  (ZOFRAN -ODT) 4 MG disintegrating tablet; Take 1 tablet (4 mg total) by mouth every 8 (eight) hours as needed for nausea or vomiting.    F/u next week for labs

## 2023-08-30 ENCOUNTER — Other Ambulatory Visit

## 2023-08-30 DIAGNOSIS — R630 Anorexia: Secondary | ICD-10-CM

## 2023-08-30 DIAGNOSIS — R5383 Other fatigue: Secondary | ICD-10-CM | POA: Diagnosis not present

## 2023-08-30 DIAGNOSIS — R110 Nausea: Secondary | ICD-10-CM

## 2023-08-31 ENCOUNTER — Ambulatory Visit: Payer: Self-pay | Admitting: Medical

## 2023-08-31 ENCOUNTER — Other Ambulatory Visit: Payer: Self-pay | Admitting: Medical

## 2023-08-31 DIAGNOSIS — R1084 Generalized abdominal pain: Secondary | ICD-10-CM

## 2023-08-31 DIAGNOSIS — R110 Nausea: Secondary | ICD-10-CM

## 2023-08-31 LAB — CBC WITH DIFFERENTIAL/PLATELET
Basophils Absolute: 0.1 x10E3/uL (ref 0.0–0.2)
Basos: 1 %
EOS (ABSOLUTE): 0.2 x10E3/uL (ref 0.0–0.4)
Eos: 3 %
Hematocrit: 42.6 % (ref 37.5–51.0)
Hemoglobin: 13.3 g/dL (ref 13.0–17.7)
Immature Grans (Abs): 0 x10E3/uL (ref 0.0–0.1)
Immature Granulocytes: 0 %
Lymphocytes Absolute: 0.5 x10E3/uL — ABNORMAL LOW (ref 0.7–3.1)
Lymphs: 7 %
MCH: 26 pg — ABNORMAL LOW (ref 26.6–33.0)
MCHC: 31.2 g/dL — ABNORMAL LOW (ref 31.5–35.7)
MCV: 83 fL (ref 79–97)
Monocytes Absolute: 1 x10E3/uL — ABNORMAL HIGH (ref 0.1–0.9)
Monocytes: 13 %
Neutrophils Absolute: 5.7 x10E3/uL (ref 1.4–7.0)
Neutrophils: 75 %
Platelets: 245 x10E3/uL (ref 150–450)
RBC: 5.12 x10E6/uL (ref 4.14–5.80)
RDW: 14.9 % (ref 11.6–15.4)
WBC: 7.5 x10E3/uL (ref 3.4–10.8)

## 2023-08-31 LAB — TSH+FREE T4
Free T4: 1.27 ng/dL (ref 0.82–1.77)
TSH: 2.36 u[IU]/mL (ref 0.450–4.500)

## 2023-08-31 LAB — COMPREHENSIVE METABOLIC PANEL WITH GFR
ALT: 10 IU/L (ref 0–44)
AST: 19 IU/L (ref 0–40)
Albumin: 3.8 g/dL (ref 3.8–4.8)
Alkaline Phosphatase: 143 IU/L — ABNORMAL HIGH (ref 44–121)
BUN/Creatinine Ratio: 16 (ref 10–24)
BUN: 19 mg/dL (ref 8–27)
Bilirubin Total: 0.9 mg/dL (ref 0.0–1.2)
CO2: 20 mmol/L (ref 20–29)
Calcium: 9.4 mg/dL (ref 8.6–10.2)
Chloride: 96 mmol/L (ref 96–106)
Creatinine, Ser: 1.16 mg/dL (ref 0.76–1.27)
Globulin, Total: 3.5 g/dL (ref 1.5–4.5)
Glucose: 106 mg/dL — ABNORMAL HIGH (ref 70–99)
Potassium: 4.6 mmol/L (ref 3.5–5.2)
Sodium: 133 mmol/L — ABNORMAL LOW (ref 134–144)
Total Protein: 7.3 g/dL (ref 6.0–8.5)
eGFR: 67 mL/min/1.73 (ref >59)

## 2023-08-31 LAB — VITAMIN B12: Vitamin B-12: 331 pg/mL (ref 232–1245)

## 2023-08-31 LAB — LIPASE: Lipase: 55 U/L (ref 13–78)

## 2023-08-31 NOTE — Progress Notes (Signed)
 There are some mild abnormalities of the labs.  Blood sugar slightly elevated, sodium slightly low, alkaline phosphatase marker little elevated, but B12 normal, pancreas marker normal, thyroid  normal.  How are his symptoms currently compared to his recent televisit?

## 2023-09-01 ENCOUNTER — Encounter: Payer: Self-pay | Admitting: Medical

## 2023-09-06 ENCOUNTER — Inpatient Hospital Stay
Admission: RE | Admit: 2023-09-06 | Discharge: 2023-09-06 | Disposition: A | Discharge status: Home / Self Care | Source: Ambulatory Visit | Attending: Medical | Admitting: Medical

## 2023-09-06 DIAGNOSIS — Z8546 Personal history of malignant neoplasm of prostate: Secondary | ICD-10-CM | POA: Diagnosis not present

## 2023-09-06 DIAGNOSIS — R1084 Generalized abdominal pain: Secondary | ICD-10-CM | POA: Diagnosis not present

## 2023-09-06 DIAGNOSIS — R110 Nausea: Secondary | ICD-10-CM | POA: Diagnosis not present

## 2023-09-06 DIAGNOSIS — J90 Pleural effusion, not elsewhere classified: Secondary | ICD-10-CM | POA: Diagnosis not present

## 2023-09-06 MED ORDER — IOPAMIDOL (ISOVUE-300) INJECTION 61%
100.0000 mL | Freq: Once | INTRAVENOUS | Status: AC | PRN
Start: 2023-09-06 — End: 2023-09-06
  Administered 2023-09-06: 100 mL via INTRAVENOUS

## 2023-09-09 ENCOUNTER — Encounter: Payer: Self-pay | Admitting: Medical

## 2023-09-09 ENCOUNTER — Other Ambulatory Visit: Payer: Self-pay | Admitting: Medical

## 2023-09-09 ENCOUNTER — Telehealth: Payer: Self-pay | Admitting: Family Medicine

## 2023-09-09 ENCOUNTER — Ambulatory Visit: Payer: Self-pay | Admitting: Medical

## 2023-09-09 DIAGNOSIS — J90 Pleural effusion, not elsewhere classified: Secondary | ICD-10-CM

## 2023-09-09 NOTE — Telephone Encounter (Signed)
 Copied from CRM [id number]. Topic: Clinical - Lab/Test Results >> Sep 09, 2023  8:13 AM Gennette ORN wrote: Reason for CRM: [facility] [phone] is calling because she has results for patient.

## 2023-09-09 NOTE — Progress Notes (Signed)
 Nothing in the abdomen is unusual however there is some concern for area in the left lower lung with some fluid.  It is recommended to have a chest CT to further evaluate this.  I am going to go ahead and set this up and have you follow-up with Dr. Joyce soon.

## 2023-09-09 NOTE — Telephone Encounter (Signed)
Already been addressed

## 2023-09-12 ENCOUNTER — Ambulatory Visit: Payer: Self-pay

## 2023-09-12 NOTE — Telephone Encounter (Signed)
 Pills sent for him has helped tremendously zofran  No chest pain Getting another CT tomorrow, we really need someone to go over this test with him in office, pray that this is nothing Saying my wife says need to get imaging done, asking me what these scans mean, doesn't really seem himself cognitively, got all his cognitive functions but think denial Laying in the bed more, only place he can get comfortable Pain has been more in the last 5-6 days Pains in right side and back He says it's like his ribs or right side Closer to upper back for back pain Seems restless Pain comes and goes per wife perception Moderate pain Had vicodan left over from surgery wife, he'll take her vicodan really helps his pain Not doubled over in pain but grimacing at time  Always looks to me like he's SOB, when he sits down wore out, don't know if from fatigue or CHF   Advised pt go to ED for symptoms, pt wife refusing at this time, requesting appt on Thursday to go over CT results for 7/29 and 8/5 CT scans as well as to examine pt with worsening symptoms. Sending message to office for scheduling longer appt to discuss all concerns. Advised hospital if any worsening of pain or SOB or seems out of it.

## 2023-09-12 NOTE — Telephone Encounter (Signed)
 FYI Only or Action Required?: Action required by provider: refusing disposition, requesting appt Thursday to go over imaging results from 7/29 and 8/5 (once resulted) and worsening symptoms.  Patient was last seen in primary care on 08/26/2023 by Bulah Alm RAMAN, PA-C.  Called Nurse Triage reporting Fatigue, Nausea, difficulty eating, questions about imaging, Back Pain, right side/rib pain, and Shortness of Breath.  Symptoms began ongoing but worsening over past week especially.  Interventions attempted: Prescription medications: zofran , Rest, hydration, or home remedies, and Other: CT scans, upcoming CT on 8/5, wife's leftover vicodan.  Symptoms are: rapidly worsening.  Triage Disposition: Go to ED Now (Notify PCP)  Patient/caregiver understands and will follow disposition?: No, refuses disposition     Copied from CRM [id number]. Topic: Clinical - Red Word Triage >> Sep 12, 2023  9:52 AM Willma SAUNDERS wrote: Red Word that prompted transfer to Nurse Triage: Patient has had Fatigue, Nausea, and has been having difficulty eating. Has been ongoing but has recently gotten worse.  Originally called to schedule for a follow up for CT results but answered yes to new/worsening symptoms. Reason for Disposition  Difficulty breathing  Answer Assessment - Initial Assessment Questions 1. NAUSEA SEVERITY: How bad is the nausea? (e.g., mild, moderate, severe; dehydration, weight loss)     Not much appetite, zofran , weighs 182 now, was 240 before got sick a year ago, lost 60 lbs unintentionally in a year 2. ONSET: When did the nausea begin?     Started losing weight 3 ish months ago, got labs then got CT scan, showed possible malignant pleural effusion, laying down all time, pain on right side and in his back, not sleeping well for months,  3. VOMITING: Any vomiting? If Yes, ask: How many times today?     No  Pills sent for him has helped tremendously zofran  No chest pain Getting another CT  tomorrow, we really need someone to go over this test with him in office, pray that this is nothing Saying my wife says need to get imaging done, asking me what these scans mean, doesn't really seem himself cognitively, got all his cognitive functions but think denial Laying in the bed more, only place he can get comfortable Pain has been more in the last 5-6 days Pains in right side and back He says it's like his ribs or right side Closer to upper back for back pain Seems restless Pain comes and goes per wife perception Moderate pain Had vicodan left over from surgery wife, he'll take her vicodan really helps his pain Not doubled over in pain but grimacing at time  Always looks to me like he's SOB, when he sits down wore out, don't know if from fatigue or CHF   Advised pt go to ED for symptoms, pt wife refusing at this time, requesting appt on Thursday to go over CT results for 7/29 and 8/5 CT scans as well as to examine pt with worsening symptoms. Sending message to office for scheduling longer appt to discuss all concerns. Advised hospital if any worsening of pain or SOB or seems out of it.  Protocols used: Nausea-A-AH

## 2023-09-13 ENCOUNTER — Ambulatory Visit
Admission: RE | Admit: 2023-09-13 | Discharge: 2023-09-13 | Disposition: A | Discharge status: Home / Self Care | Source: Ambulatory Visit | Attending: Medical | Admitting: Medical

## 2023-09-13 DIAGNOSIS — J90 Pleural effusion, not elsewhere classified: Secondary | ICD-10-CM

## 2023-09-13 DIAGNOSIS — R911 Solitary pulmonary nodule: Secondary | ICD-10-CM | POA: Diagnosis not present

## 2023-09-15 ENCOUNTER — Encounter: Payer: Self-pay | Admitting: Medical Oncology

## 2023-09-15 ENCOUNTER — Ambulatory Visit: Admitting: Family Medicine

## 2023-09-15 ENCOUNTER — Encounter: Payer: Self-pay | Admitting: Family Medicine

## 2023-09-15 VITALS — BP 120/70 | HR 86 | Ht 67.0 in | Wt 186.4 lb

## 2023-09-15 DIAGNOSIS — R0781 Pleurodynia: Secondary | ICD-10-CM | POA: Diagnosis not present

## 2023-09-15 DIAGNOSIS — J90 Pleural effusion, not elsewhere classified: Secondary | ICD-10-CM | POA: Diagnosis not present

## 2023-09-15 DIAGNOSIS — R110 Nausea: Secondary | ICD-10-CM | POA: Diagnosis not present

## 2023-09-15 MED ORDER — HYDROCODONE-ACETAMINOPHEN 5-325 MG PO TABS: 1.0000 | ORAL_TABLET | Freq: Four times a day (QID) | ORAL | 0 refills | Status: DC | PRN

## 2023-09-15 MED ORDER — ONDANSETRON 4 MG PO TBDP: 4.0000 mg | ORAL_TABLET | Freq: Three times a day (TID) | ORAL | 1 refills | Status: DC | PRN

## 2023-09-15 NOTE — Progress Notes (Signed)
   Subjective:    Patient ID: [name], male    DOB: 12/19/1950, 73 y.o.   MRN: [id number]  Discussed the use of AI scribe software for clinical note transcription with the patient, who gave verbal consent to proceed.  History of Present Illness   [name] is a 73 year old male with endocarditis who presents with fatigue, weight loss, and shortness of breath.  He experiences persistent fatigue, feeling constantly tired to the point of nearly passing out during short walks, such as from the car to a store. This fatigue has been ongoing for over a year and has worsened recently.  He has been experiencing significant weight loss and notes that he is 'back to losing weight again.' He also reports a decreased appetite and has been experiencing nausea intermittently over the past couple of months, for which he has been using Zofran  with some relief.  He reports shortness of breath that has been worsening over the past year. He has a history of endocarditis and is currently on Eliquis  and lifelong amoxicillin  therapy.  He experiences pain on his right side, particularly in his back and ribs. He has been using Vicodin, which has provided some relief.  He underwent an abdominal CT recently, which showed no acute findings but led to a chest CT.  He had an endoscopy and colonoscopy in February, which showed no significant findings in the stomach and a couple of polyps in the colon.  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 Right lung volume loss and atelectasis noted. Mild  mediastinal lymphadenopathy stable. Speculated pulmonary nodule in left upper lobe. Further diagnostic workup needed. - Refer to oncology for evaluation and management. - Consider interventional radiology for diagnostic procedures. - Order codeine for rib pain.  Chronic right-sided chest and rib pain Likely due to pleural effusion pressure. - Prescribe codeine for pain.  Chronic fatigue, weight loss, and weakness Symptoms persistent and worsening, possibly related to suspected malignancy.  Chronic nausea and decreased appetite Symptoms present for months, partially relieved by Zofran , possibly related to suspected malignancy. - Refill Zofran  for nausea.  Infective endocarditis Requires lifelong antibiotics and anticoagulation. - Continue lifelong antibiotics and anticoagulation therapy.     Referral made to interventional radiology for the effusion greater than 40 minutes spent in review of record, discussion problem and referral.

## 2023-09-16 ENCOUNTER — Inpatient Hospital Stay

## 2023-09-16 ENCOUNTER — Encounter: Payer: Self-pay | Admitting: Physician Assistant

## 2023-09-16 ENCOUNTER — Encounter: Payer: Self-pay | Admitting: Medical Oncology

## 2023-09-16 ENCOUNTER — Inpatient Hospital Stay: Attending: Physician Assistant | Admitting: Physician Assistant

## 2023-09-16 VITALS — BP 112/74 | HR 98 | Temp 97.3°F | Resp 18 | Wt 186.9 lb

## 2023-09-16 DIAGNOSIS — J90 Pleural effusion, not elsewhere classified: Secondary | ICD-10-CM

## 2023-09-16 DIAGNOSIS — Z800 Family history of malignant neoplasm of digestive organs: Secondary | ICD-10-CM | POA: Insufficient documentation

## 2023-09-16 DIAGNOSIS — Z87891 Personal history of nicotine dependence: Secondary | ICD-10-CM | POA: Diagnosis not present

## 2023-09-16 DIAGNOSIS — R911 Solitary pulmonary nodule: Secondary | ICD-10-CM | POA: Diagnosis not present

## 2023-09-16 DIAGNOSIS — Z8546 Personal history of malignant neoplasm of prostate: Secondary | ICD-10-CM | POA: Insufficient documentation

## 2023-09-16 DIAGNOSIS — Z801 Family history of malignant neoplasm of trachea, bronchus and lung: Secondary | ICD-10-CM | POA: Insufficient documentation

## 2023-09-16 LAB — CBC WITH DIFFERENTIAL (CANCER CENTER ONLY)
Abs Immature Granulocytes: 0.07 K/uL (ref 0.00–0.07)
Basophils Absolute: 0.1 K/uL (ref 0.0–0.1)
Basophils Relative: 1 %
Eosinophils Absolute: 0.1 K/uL (ref 0.0–0.5)
Eosinophils Relative: 2 %
HCT: 42.7 % (ref 39.0–52.0)
Hemoglobin: 13.6 g/dL (ref 13.0–17.0)
Immature Granulocytes: 1 %
Lymphocytes Relative: 6 %
Lymphs Abs: 0.5 K/uL — ABNORMAL LOW (ref 0.7–4.0)
MCH: 25.3 pg — ABNORMAL LOW (ref 26.0–34.0)
MCHC: 31.9 g/dL (ref 30.0–36.0)
MCV: 79.4 fL — ABNORMAL LOW (ref 80.0–100.0)
Monocytes Absolute: 1.1 K/uL — ABNORMAL HIGH (ref 0.1–1.0)
Monocytes Relative: 14 %
Neutro Abs: 6.5 K/uL (ref 1.7–7.7)
Neutrophils Relative %: 76 %
Platelet Count: 277 K/uL (ref 150–400)
RBC: 5.38 MIL/uL (ref 4.22–5.81)
RDW: 16.6 % — ABNORMAL HIGH (ref 11.5–15.5)
WBC Count: 8.4 K/uL (ref 4.0–10.5)
nRBC: 0 % (ref 0.0–0.2)

## 2023-09-16 LAB — CMP (CANCER CENTER ONLY)
ALT: 9 U/L (ref 0–44)
AST: 17 U/L (ref 15–41)
Albumin: 3.8 g/dL (ref 3.5–5.0)
Alkaline Phosphatase: 121 U/L (ref 38–126)
Anion gap: 9 (ref 5–15)
BUN: 19 mg/dL (ref 8–23)
CO2: 28 mmol/L (ref 22–32)
Calcium: 9.4 mg/dL (ref 8.9–10.3)
Chloride: 97 mmol/L — ABNORMAL LOW (ref 98–111)
Creatinine: 1.09 mg/dL (ref 0.61–1.24)
GFR, Estimated: >60 mL/min (ref >60)
Glucose, Bld: 129 mg/dL — ABNORMAL HIGH (ref 70–99)
Potassium: 4.9 mmol/L (ref 3.5–5.1)
Sodium: 134 mmol/L — ABNORMAL LOW (ref 135–145)
Total Bilirubin: 0.9 mg/dL (ref 0.0–1.2)
Total Protein: 8.1 g/dL (ref 6.5–8.1)

## 2023-09-16 LAB — LACTATE DEHYDROGENASE: LDH: 306 U/L — ABNORMAL HIGH (ref 98–192)

## 2023-09-16 NOTE — Progress Notes (Signed)
 [facility]  Patient presented to clinic, with spouse, for his scheduled appointment with PA-C Johnston. I introduced myself and provided them with my direct contact information. Patient and spouse were both encouraged to call me with any questions/concerns they may have.  Colene KYM Raider, RN, BSN, [facility] Oncology Nurse Navigator, [facility] 09/16/2023 3:06 PM

## 2023-09-18 NOTE — Progress Notes (Signed)
 [facility] [phone]   [phone]  INITIAL CONSULTATION:  Patient Care Team: [name], MD as PCP - General (Family Medicine) Verlin Lonni BIRCH, MD as PCP - Cardiology (Cardiology) Cindie Ole DASEN, MD as PCP - Electrophysiology (Cardiology) Devere Lonni Righter, MD as Consulting Physician (Urology) Grayce Buddle, RN Nurse Navigator as Registered Nurse (Medical Oncology) Patrcia Cough, MD as Consulting Physician (Radiation Oncology) Lionell Jon DEL, [facility] (Pharmacist) Little, Clayborne CROME, RN as VBCI Care Management  CHIEF COMPLAINTS/PURPOSE OF CONSULTATION:  Right pleural effusion/nodularity   HISTORY OF PRESENTING ILLNESS:  [name] 73 y.o. male with medical history significant for prostate cancer s/p radioactive seed implantation in 2021, CAD status post CABG x 2, status post left atrial appendage clipping, status post foramen ovale closure, status post aortic and mitral valve replacement with bioprosthetic, hyperlipidemia, complete heart block status post pacemaker, atrial flutter who presents for evaluation of right pleural effusion.  He is accompanied by his wife.  On review of the previous records, Mr. Stepanek presented to the PCP due to fatigue, weight loss and shortness of breath.  CT chest was obtained on 09/13/2023 that showed diffuse nonspecific minimal fluid throughout the right pleural effusion and tracking along the fissures, concerning for malignant pleural effusion.  In addition there was mild mediastinal lymphadenopathy that was unchanged since 2020.  There is a 7 mm mildly spiculated pulmonary nodule in the left upper lobe.  On exam today, Mr. Pocock reports progressive fatigue that impacts his ADLs.  He reports decreased appetite and has lost nearly 35 pounds since December 2024 that was all unintentional.  He does have some nausea without vomiting episodes that improves with both oral  medications.  He reports constipation with a bowel movement daily.  He denies easy bruising or signs of active bleeding.  He reports having shortness of breath with exertion for the last couple of months.  He does have ongoing right sided mid back pain that improves with prescribed Norco.  He denies fevers, chills, night sweats, shortness of breath, cough, headaches, dizziness or neuropathy.  MEDICAL HISTORY:  Past Medical History:  Diagnosis Date   BCE (basal cell epithelioma), arm    RIGHT SHOULDER   Bradycardia    Bumps on skin    on left side of nose for last 3 weeks   CAD (coronary artery disease)    a. s/p CABGx2 10/11/18 LIMA to LAD, SVG to RCA, EVH via right thigh.   Dyslipidemia    Dysrhythmia    aflutter/afib   Ectatic thoracic aorta (HCC)    Esophageal stricture    GERD (gastroesophageal reflux disease)    Heart murmur    sees dr jerrald   Hyperlipidemia    Incidental pulmonary nodule, > 3mm and < 8mm 10/04/2018   Noted on CTA   Inguinal hernia    bilateral   Obesity    Presence of permanent cardiac pacemaker    Prostate cancer (HCC)    S/P aortic valve replacement with bioprosthetic valve 10/11/2018   23 mm Edwards Inspiris Resilia stented bovine pericardial tissue valve, miltral valve done also   S/P CABG x 2 10/11/2018   LIMA to LAD, SVG to RCA, EVH via right thigh   S/P mitral valve replacement with bioprosthetic valve 10/11/2018   27 mm Medtronic Mosaic stented porcine bioprosthetic tissue valve   S/P patent foramen ovale closure 10/11/2018   S/P placement of cardiac pacemaker    Smoker  Stroke [facility])     SURGICAL HISTORY: Past Surgical History:  Procedure Laterality Date   AORTIC VALVE REPLACEMENT N/A 10/11/2018   Procedure: AORTIC VALVE REPLACEMENT (AVR) with 23 Inspiris Bioprosthetic Aortic valve.;  Surgeon: Dusty Sudie DEL, MD;  Location: [facility] OR;  Service: Open Heart Surgery;  Laterality: N/A;   AV FISTULA PLACEMENT Left 06/15/2022   Procedure: LEFT  UPPER EXTREMITY THROMBECTOMY;  Surgeon: Sheree Penne Bruckner, MD;  Location: [facility] OR;  Service: Vascular;  Laterality: Left;   BIOPSY  03/22/2023   Procedure: BIOPSY;  Surgeon: Elicia Claw, MD;  Location: [facility];  Service: Gastroenterology;;   CLIPPING OF ATRIAL APPENDAGE N/A 10/11/2018   Procedure: CLIPPING OF LEFT ATRIAL APPENDAGE with 45 AtriCure Clip.;  Surgeon: Dusty Sudie DEL, MD;  Location: [facility] OR;  Service: Open Heart Surgery;  Laterality: N/A;   COLONOSCOPY  03/2008,2004   Dr. Dyane   COLONOSCOPY WITH PROPOFOL  N/A 03/22/2023   Procedure: COLONOSCOPY WITH PROPOFOL ;  Surgeon: Elicia Claw, MD;  Location: [facility];  Service: Gastroenterology;  Laterality: N/A;   CORONARY ARTERY BYPASS GRAFT N/A 10/11/2018   Procedure: CORONARY ARTERY BYPASS GRAFTING (CABG) x 2 using LIMA to the LAD and endoscopic greater saphenous vein harvesting to the RCA.;  Surgeon: Dusty Sudie DEL, MD;  Location: [facility] OR;  Service: Open Heart Surgery;  Laterality: N/A;   CYSTOSCOPY  04/04/2019   Procedure: CYSTOSCOPY;  Surgeon: Devere Bruckner Righter, MD;  Location[facility];  Service: Urology;;   ESOPHAGOGASTRODUODENOSCOPY (EGD) WITH PROPOFOL  N/A 03/22/2023   Procedure: ESOPHAGOGASTRODUODENOSCOPY (EGD) WITH PROPOFOL ;  Surgeon: Elicia Claw, MD;  [facility]: [facility];  Service: Gastroenterology;  Laterality: N/A;   HERNIA REPAIR  12/11/10   BIH   MITRAL VALVE REPLACEMENT N/A 10/11/2018   Procedure: MITRAL VALVE (MV) REPLACEMENT with 27 Mosaic Bioprosthetic Mitral valve.;  Surgeon: Dusty Sudie DEL, MD;  Location: [facility] OR;  Service: Open Heart Surgery;  Laterality: N/A;   PACEMAKER IMPLANT N/A 10/17/2018   Procedure: PACEMAKER IMPLANT;  Surgeon: Kelsie Agent, MD;  [facility];  Service: Cardiovascular;  Laterality: N/A;   POLYPECTOMY  03/22/2023   Procedure: POLYPECTOMY;  Surgeon: Elicia Claw, MD;  Location: [facility];  Service: Gastroenterology;;    RADIOACTIVE SEED IMPLANT N/A 04/04/2019   Procedure: RADIOACTIVE SEED IMPLANT/BRACHYTHERAPY IMPLANT, cystoscopy;  Surgeon: Devere Bruckner Righter, MD;  Location: [facility];  Service: Urology;  Laterality: N/A;  58 seeds   REPAIR OF PATENT FORAMEN OVALE N/A 10/11/2018   Procedure: CLOSURE OF PATENT FORAMEN OVALE;  Surgeon: Dusty Sudie DEL, MD;  Location: [facility] OR;  Service: Open Heart Surgery;  Laterality: N/A;   RIGHT/LEFT HEART CATH AND CORONARY ANGIOGRAPHY N/A 09/15/2018   Procedure: RIGHT/LEFT HEART CATH AND CORONARY ANGIOGRAPHY;  Surgeon: Verlin Bruckner BIRCH, MD;  [facility];  Service: Cardiovascular;  Laterality: N/A;   RIGHT/LEFT HEART CATH AND CORONARY/GRAFT ANGIOGRAPHY N/A 05/25/2022   Procedure: RIGHT/LEFT HEART CATH AND CORONARY/GRAFT ANGIOGRAPHY;  Surgeon: Verlin Bruckner BIRCH, MD;  [facility];  Service: Cardiovascular;  Laterality: N/A;   TEE WITHOUT CARDIOVERSION N/A 12/12/2015   Procedure: TRANSESOPHAGEAL ECHOCARDIOGRAM (TEE);  Surgeon: Jerel Balding, MD;  Location: [facility];  Service: Cardiovascular;  Laterality: N/A;   TEE WITHOUT CARDIOVERSION N/A 10/11/2018   Procedure: TRANSESOPHAGEAL ECHOCARDIOGRAM (TEE);  Surgeon: Dusty Sudie DEL, MD;  Location: [facility] OR;  Service: Open Heart Surgery;  Laterality: N/A;   TEE WITHOUT CARDIOVERSION N/A 06/16/2022   Procedure: TRANSESOPHAGEAL ECHOCARDIOGRAM;  Surgeon: Gardenia,  Aditya, DO;  Location: [facility];  Service: Cardiovascular;  Laterality: N/A;   THYMECTOMY  1976   radaiation tx done    SOCIAL HISTORY: Social History   Socioeconomic History   Marital status: Married    Spouse name: Not on file   Number of children: 0   Years of education: Not on file   Highest education level: Bachelor's degree (e.g., BA, AB, BS)  Occupational History   Occupation: Market researcher: [employer]  Tobacco Use   Smoking status: Former    Average packs/day: 0.3 packs/day for  40.6 years (14.0 ttl pk-yrs)    Types: Cigarettes    Start date: 02/08/1978    Passive exposure: Never   Smokeless tobacco: Never  Vaping Use   Vaping status: Never Used  Substance and Sexual Activity   Alcohol use: Yes    Alcohol/week: 1.0 standard drink of alcohol    Types: 1 Cans of beer per week    Comment: rare   Drug use: No   Sexual activity: Yes  Other Topics Concern   Not on file  Social History Narrative   Not on file   Social Drivers of Health   Financial Resource Strain: Low Risk  (08/25/2023)   Overall Financial Resource Strain (CARDIA)    Difficulty of Paying Living Expenses: Not hard at all  Food Insecurity: No Food Insecurity (08/25/2023)   Hunger Vital Sign    Worried About Running Out of Food in the Last Year: Never true    Ran Out of Food in the Last Year: Never true  Transportation Needs: No Transportation Needs (08/25/2023)   PRAPARE - Administrator, Civil Service (Medical): No    Lack of Transportation (Non-Medical): No  Physical Activity: Insufficiently Active (08/25/2023)   Exercise Vital Sign    Days of Exercise per Week: 1 day    Minutes of Exercise per Session: 10 min  Stress: Stress Concern Present (08/25/2023)   [employer] of Occupational Health - Occupational Stress Questionnaire    Feeling of Stress: To some extent  Social Connections: Socially Integrated (08/25/2023)   Social Connection and Isolation Panel    Frequency of Communication with Friends and Family: More than three times a week    Frequency of Social Gatherings with Friends and Family: Once a week    Attends Religious Services: More than 4 times per year    Active Member of [employer] or Organizations: Yes    Attends Engineer, structural: More than 4 times per year    Marital Status: Married  Catering manager Violence: Not At Risk (07/05/2023)   Humiliation, Afraid, Rape, and Kick questionnaire    Fear of Current or Ex-Partner: No    Emotionally Abused: No     Physically Abused: No    Sexually Abused: No    FAMILY HISTORY: Family History  Problem Relation Age of Onset   Colon cancer Mother    Lung cancer Father        smoker   CAD Neg Hx    Pancreatic cancer Neg Hx    Breast cancer Neg Hx     ALLERGIES:  is allergic to losartan .  MEDICATIONS:  Current Outpatient Medications  Medication Sig Dispense Refill   polyethylene glycol powder (GLYCOLAX /MIRALAX ) 17 GM/SCOOP powder Take 17 g by mouth daily as needed for mild constipation. 238 g 0   acetaminophen  (TYLENOL ) 325 MG tablet Take 2 tablets (650 mg total) by mouth every 4 (  four) hours as needed for moderate pain.     amoxicillin  (AMOXIL ) 500 MG capsule Take 1 capsule (500 mg total) by mouth 3 (three) times daily. 90 capsule 5   atorvastatin  (LIPITOR) 20 MG tablet TAKE 1 TABLET BY MOUTH EVERY DAY 90 tablet 3   cetirizine (ZYRTEC) 10 MG tablet Take 10 mg by mouth daily.     digoxin  (LANOXIN ) 0.125 MG tablet TAKE 1 TABLET BY MOUTH EVERY DAY 90 tablet 3   ELIQUIS  5 MG TABS tablet TAKE 1 TABLET BY MOUTH 2 TIMES A DAY 60 tablet 11   empagliflozin  (JARDIANCE ) 25 MG TABS tablet Take 1 tablet (25 mg total) by mouth daily before breakfast. 90 tablet 1   furosemide  (LASIX ) 20 MG tablet Take 1 tablet (20 mg total) by mouth daily. Patient is taking every other day. 90 tablet 0   HYDROcodone -acetaminophen  (NORCO/VICODIN) 5-325 MG tablet Take 1 tablet by mouth every 6 (six) hours as needed for moderate pain (pain score 4-6). 30 tablet 0   metoprolol  succinate (TOPROL -XL) 100 MG 24 hr tablet TAKE 1 TABLET BY MOUTH EVERY DAY (TAKE WITH OR IMMEDIATELY FOLLOWING A MEAL) 90 tablet 3   omeprazole  (PRILOSEC  OTC) 20 MG tablet Take 1 tablet (20 mg total) by mouth daily. 90 tablet 3   ondansetron  (ZOFRAN -ODT) 4 MG disintegrating tablet Take 1 tablet (4 mg total) by mouth every 8 (eight) hours as needed for nausea or vomiting. 30 tablet 1   oxymetazoline (AFRIN) 0.05 % nasal spray Place 1 spray into both nostrils at  bedtime as needed for congestion.     spironolactone  (ALDACTONE ) 25 MG tablet Take 1 tablet (25 mg total) by mouth daily. NEEDS FOLLOW UP APPOINTMENT FOR MORE REFILLS 90 tablet 0   No current facility-administered medications for this visit.    REVIEW OF SYSTEMS:   Constitutional: ( - ) fevers, ( - )  chills , ( - ) night sweats Eyes: ( - ) blurriness of vision, ( - ) double vision, ( - ) watery eyes Ears, nose, mouth, throat, and face: ( - ) mucositis, ( - ) sore throat Respiratory: ( - ) cough, ( + ) dyspnea, ( - ) wheezes Cardiovascular: ( - ) palpitation, ( - ) chest discomfort, ( - ) lower extremity swelling Gastrointestinal:  (+) nausea, ( - ) heartburn, ( - ) change in bowel habits Skin: ( - ) abnormal skin rashes Lymphatics: ( - ) new lymphadenopathy, ( - ) easy bruising Neurological: ( - ) numbness, ( - ) tingling, ( - ) new weaknesses Behavioral/Psych: ( - ) mood change, ( - ) new changes  All other systems were reviewed with the patient and are negative.  PHYSICAL EXAMINATION: ECOG PERFORMANCE STATUS: 1 - Symptomatic but completely ambulatory  Vitals:   09/16/23 1347  BP: 112/74  Pulse: 98  Resp: 18  Temp: (!) 97.3 F (36.3 C)  SpO2: 94%   Filed Weights   09/16/23 1347  Weight: 186 lb 14.4 oz (84.8 kg)    GENERAL: well appearing male in NAD  SKIN: skin color, texture, turgor are normal, no rashes or significant lesions EYES: conjunctiva are pink and non-injected, sclera clear OROPHARYNX: no exudate, no erythema; lips, buccal mucosa, and tongue normal  NECK: supple, non-tender LYMPH:  no palpable lymphadenopathy in the cervical, axillary or supraclavicular lymph nodes.  LUNGS: decreased breath sounds in right side. No wheezing, crackles bilaterally.  HEART: regular rate & rhythm and no murmurs and no lower extremity edema ABDOMEN:  soft, non-tender, non-distended, normal bowel sounds Musculoskeletal: no cyanosis of digits and no clubbing  PSYCH: alert & oriented  x 3, fluent speech NEURO: no focal motor/sensory deficits  LABORATORY DATA:  I have reviewed the data as listed    Latest Ref Rng & Units 09/16/2023    2:52 PM 08/30/2023   10:07 AM 08/09/2023    2:55 PM  CBC  WBC 4.0 - 10.5 K/uL 8.4  7.5  8.7   Hemoglobin 13.0 - 17.0 g/dL 86.3  86.6  86.2   Hematocrit 39.0 - 52.0 % 42.7  42.6  42.5   Platelets 150 - 400 K/uL 277  245  277        Latest Ref Rng & Units 09/16/2023    2:52 PM 08/30/2023   10:07 AM 08/09/2023    2:55 PM  CMP  Glucose 70 - 99 mg/dL 870  893  862   BUN 8 - 23 mg/dL 19  19  17    Creatinine 0.61 - 1.24 mg/dL 8.90  8.83  8.89   Sodium 135 - 145 mmol/L 134  133  135   Potassium 3.5 - 5.1 mmol/L 4.9  4.6  4.2   Chloride 98 - 111 mmol/L 97  96  100   CO2 22 - 32 mmol/L 28  20  24    Calcium  8.9 - 10.3 mg/dL 9.4  9.4  9.3   Total Protein 6.5 - 8.1 g/dL 8.1  7.3  7.8   Total Bilirubin 0.0 - 1.2 mg/dL 0.9  0.9  1.0   Alkaline Phos 38 - 126 U/L 121  143    AST 15 - 41 U/L 17  19  15    ALT 0 - 44 U/L 9  10  12       RADIOGRAPHIC STUDIES: I have personally reviewed the radiological images as listed and agreed with the findings in the report. CT CHEST WO CONTRAST Result Date: 09/15/2023 CLINICAL DATA:  Chest pain. Right pleural effusion. Prostate carcinoma. * Tracking Code: BO * EXAM: CT CHEST WITHOUT CONTRAST TECHNIQUE: Multidetector CT imaging of the chest was performed following the standard protocol without IV contrast. RADIATION DOSE REDUCTION: This exam was performed according to the departmental dose-optimization program which includes automated exposure control, adjustment of the mA and/or kV according to patient size and/or use of iterative reconstruction technique. COMPARISON:  10/04/2018 FINDINGS: Cardiovascular: Mild cardiomegaly. Pacemaker leads in right heart. Prior CABG. 4.0 cm ascending thoracic aortic aneurysm, increased in size from 3.7 cm previously. Mediastinum/Nodes: Mild mediastinal lymphadenopathy noted with largest  lymph nodes in the right paratracheal region measuring 1.5 cm and subcarinal region measuring 1.7 cm, without significant change since previous study. No definite hilar lymphadenopathy identified on this unenhanced exam. Lungs/Pleura: Diffuse nodular soft tissue thickening is seen throughout right pleural space and tracking along the fissures, with small amount of low-attenuation pleural fluid seen. This is highly suspicious for a malignant pleural effusion. Right lung volume loss and atelectasis noted, however, central tracheobronchial airways remain patent and no centrally obstructing mass is identified. A 7 mm mildly spiculated nodule is seen in the apical left upper lobe on image 23/8. No other left lung masses or pleural effusion identified. Upper Abdomen:  Unremarkable. Musculoskeletal: No suspicious bone lesions. Chronic appearing compression deformities of several mid and lower thoracic vertebral bodies are noted. IMPRESSION: Diffuse nodular soft tissue thickening and small amount of fluid throughout the right pleural space and tracking along the fissures, highly suspicious for a malignant pleural effusion.  Right lung volume loss and atelectasis, however no centrally obstructing mass identified. Mild mediastinal lymphadenopathy, without significant change since prior study in 2020. 7 mm mildly spiculated pulmonary nodule in left upper lobe. Malignancy cannot be excluded. Consider PET-CT for further evaluation. Electronically Signed   By: Norleen DELENA Kil M.D.   On: 09/15/2023 11:46   CT ABDOMEN PELVIS W CONTRAST Result Date: 09/08/2023 CLINICAL DATA:  Diffuse abdominal pain and nausea. History of prostate carcinoma. * Tracking Code: BO * EXAM: CT ABDOMEN AND PELVIS WITH CONTRAST TECHNIQUE: Multidetector CT imaging of the abdomen and pelvis was performed using the standard protocol following bolus administration of intravenous contrast. RADIATION DOSE REDUCTION: This exam was performed according to the  departmental dose-optimization program which includes automated exposure control, adjustment of the mA and/or kV according to patient size and/or use of iterative reconstruction technique. CONTRAST:  100mL ISOVUE -300 IOPAMIDOL  (ISOVUE -300) INJECTION 61% COMPARISON:  05/06/2022 FINDINGS: Lower Chest: Small pleural effusion marked diffuse nodular pleural soft tissue thickening is seen, suspicious for a malignant pleural effusion. Mild left basilar atelectasis also noted. Hepatobiliary: No suspicious hepatic masses identified. Gallbladder is unremarkable. No evidence of biliary ductal dilatation. Pancreas:  No mass or inflammatory changes. Spleen: Within normal limits in size and appearance. Adrenals/Urinary Tract: Normal adrenal glands. Mild right renal parenchymal scarring noted. No renal masses identified. No evidence of ureteral calculi or hydronephrosis. Unremarkable unopacified urinary bladder. Stomach/Bowel: No evidence of obstruction, inflammatory process or abnormal fluid collections. Vascular/Lymphatic: No pathologically enlarged lymph nodes. No acute vascular findings. Reproductive:  Small prostate gland noted with brachytherapy seeds. Other:  None. Musculoskeletal:  No suspicious bone lesions identified. IMPRESSION: No acute findings within the abdomen or pelvis. Small left pleural effusion with marked diffuse nodular pleural soft tissue thickening, suspicious for a malignant pleural effusion. Chest CT with contrast is recommended for further evaluation. These results will be called to the ordering clinician or representative by the Radiologist Assistant, and communication documented in the PACS or [employer]. Electronically Signed   By: Norleen DELENA Kil M.D.   On: 09/08/2023 19:11    ASSESSMENT & PLAN GEDALIA MCMILLON is a 73 y.o. side effects who presents to the rapid diagnostic clinic for evaluation of right pleural effusion/nodularity.   #Right pleural effusion/nodularity: --Etiologies include  infectious process, inflammatory process, malignant process --Labs today to check CBC, CMP, LDH levels. Repeat PSA level due to history of prostate cancer --Will consult with IR to determine if right thoracentesis is feasible for tissue sampling. Otherwise, proceed with PET/CT scan to further evaluate and possibly identify other target lesions.  --If no targetable lesions to biopsy, we recommend monitoring with serial imaging in 3 months.     Orders Placed This Encounter  Procedures   CBC with Differential (Cancer Center Only)    Standing Status:   Future    Number of Occurrences:   1    Expiration Date:   09/15/2024   CMP (Cancer Center only)    Standing Status:   Future    Number of Occurrences:   1    Expiration Date:   09/15/2024   Prostate-Specific AG, Serum    Standing Status:   Future    Number of Occurrences:   1    Expiration Date:   09/15/2024   Lactate dehydrogenase (LDH)    Standing Status:   Future    Number of Occurrences:   1    Expiration Date:   09/15/2024    All questions were answered. The patient  knows to call the clinic with any problems, questions or concerns.  I have spent a total of 60 minutes minutes of face-to-face and non-face-to-face time, preparing to see the patient, obtaining and/or reviewing separately obtained history, performing a medically appropriate examination, counseling and educating the patient, ordering medications/tests/procedures, referring and communicating with other health care professionals, documenting clinical information in the electronic health record, independently interpreting results and communicating results to the patient, and care coordination.   Johnston Police, PA-C Department of Hematology/Oncology [facility] at [facility] Phone: [phone]  Patient was seen with Dr. Federico  I have read the above note and personally examined the patient. I agree with the assessment and plan as noted above.  Briefly  Mr. Perris Conwell is a 73 year old male who presents for evaluation of a pleural effusion with nodularity, etiology unclear.  The patient was referred to us  because malignancy cannot be excluded based on his prior imaging.  The patient has a prior history of prostate cancer which has been under good control.  Based on his prior scans there is no evidence of an overt malignancy elsewhere in the body.  At this time would recommend PET CT scan in order to evaluate for leniency as well as a thoracentesis in order to obtain fluid from the pleural cavity and assure that there is no infectious or malignant etiology.  Patient voiced understanding of our findings and recommendation moving forward.   Norleen IVAR Federico, MD Department of Hematology/Oncology [facility] at [facility] Phone: [phone] Pager: [phone] Email: [email or web address]

## 2023-09-19 ENCOUNTER — Other Ambulatory Visit: Payer: Self-pay | Admitting: Physician Assistant

## 2023-09-19 ENCOUNTER — Other Ambulatory Visit: Payer: Self-pay

## 2023-09-19 ENCOUNTER — Encounter: Payer: Self-pay | Admitting: Physician Assistant

## 2023-09-19 ENCOUNTER — Encounter: Payer: Self-pay | Admitting: Medical Oncology

## 2023-09-19 DIAGNOSIS — J90 Pleural effusion, not elsewhere classified: Secondary | ICD-10-CM

## 2023-09-19 NOTE — Patient Instructions (Signed)
 Visit Information  Thank you for taking time to visit with me today. Please don't hesitate to contact me if I can be of assistance to you before our next scheduled appointment.  Your next care management appointment is by telephone on Friday, September 12 at 12:15 PM  Please call the care guide team at [phone] if you need to cancel, schedule, or reschedule an appointment.   Please call [phone]-TALK (toll free, 24 hour hotline) if you are experiencing a Mental Health or Behavioral Health Crisis or need someone to talk to.  Clayborne Ly RN BSN CCM [place]  [facility], [facility] Health Nurse Care Coordinator  Direct Dial: [phone] Website: [email or web address]

## 2023-09-19 NOTE — Patient Outreach (Signed)
 Complex Care Management   Visit Note  09/19/2023  Name:  [name] MRN: [id number] DOB: 05-21-1950  Situation: Referral received for Complex Care Management related to Atrial Fibrillation, CAD, CHF, and s/p valve replacement with history of acute endocarditis, nausea, fatigue, weakness, Pacemaker, DOE, weight loss. I obtained verbal consent from Patient.  Visit completed with patient on the phone.  Background:   Past Medical History:  Diagnosis Date   BCE (basal cell epithelioma), arm    RIGHT SHOULDER   Bradycardia    Bumps on skin    on left side of nose for last 3 weeks   CAD (coronary artery disease)    a. s/p CABGx2 10/11/18 LIMA to LAD, SVG to RCA, EVH via right thigh.   Dyslipidemia    Dysrhythmia    aflutter/afib   Ectatic thoracic aorta (HCC)    Esophageal stricture    GERD (gastroesophageal reflux disease)    Heart murmur    sees dr jerrald   Hyperlipidemia    Incidental pulmonary nodule, > 3mm and < 8mm 10/04/2018   Noted on CTA   Inguinal hernia    bilateral   Obesity    Presence of permanent cardiac pacemaker    Prostate cancer (HCC)    S/P aortic valve replacement with bioprosthetic valve 10/11/2018   23 mm Edwards Inspiris Resilia stented bovine pericardial tissue valve, miltral valve done also   S/P CABG x 2 10/11/2018   LIMA to LAD, SVG to RCA, EVH via right thigh   S/P mitral valve replacement with bioprosthetic valve 10/11/2018   27 mm Medtronic Mosaic stented porcine bioprosthetic tissue valve   S/P patent foramen ovale closure 10/11/2018   S/P placement of cardiac pacemaker    Smoker    Stroke [facility])     Assessment: Patient Reported Symptoms:  Cognitive Cognitive Status: Alert and oriented to person, place, and time, Normal speech and language skills Cognitive/Intellectual Conditions Management [RPT]: None reported or documented in medical history or problem list   Health Maintenance Behaviors: Annual physical exam, Healthy diet, Sleep  adequate Health Facilitated by: Healthy diet, Rest, Pain control  Neurological Neurological Review of Symptoms: Dizziness, Weakness Neurological Management Strategies: Adequate rest, Routine screening, Medication therapy Neurological Self-Management Outcome: 3 (uncertain)  HEENT HEENT Symptoms Reported: Other: (throat congestion)      Cardiovascular Cardiovascular Symptoms Reported: Fatigue, Dizziness (near syncope feeling) Does patient have uncontrolled Hypertension?: No Cardiovascular Management Strategies: Adequate rest, Fluid modification, Medication therapy, Routine screening Cardiovascular Self-Management Outcome: 3 (uncertain)  Respiratory Respiratory Symptoms Reported: Other:, Shortness of breath Other Respiratory Symptoms: right posterior incostal pain Respiratory Management Strategies: Routine screening  Endocrine Endocrine Symptoms Reported: No symptoms reported Is patient diabetic?: Yes Is patient checking blood sugars at home?: No Endocrine Self-Management Outcome: 4 (good)  Gastrointestinal Gastrointestinal Symptoms Reported: Constipation, Change in appetite Gastrointestinal Management Strategies: Diet modification, Medication therapy Gastrointestinal Self-Management Outcome: 3 (uncertain)    Genitourinary Genitourinary Symptoms Reported: No symptoms reported    Integumentary Integumentary Symptoms Reported: No symptoms reported    Musculoskeletal Musculoskelatal Symptoms Reviewed: Weakness, Limited mobility, Difficulty walking        Psychosocial Psychosocial Symptoms Reported: No symptoms reported   Major Change/Loss/Stressor/Fears (CP): Medical condition, self Techniques to Cope with Loss/Stress/Change: Diversional activities Quality of Family Relationships: supportive, involved, helpful Do you feel physically threatened by others?: No      08/19/2023   11:17 AM  Depression screen PHQ 2/9  Decreased Interest 0  Down, Depressed, Hopeless 0  PHQ -  2 Score 0     There were no vitals filed for this visit.  Medications Reviewed Today     Reviewed by Morgan Clayborne CROME, RN (Registered Nurse) on 09/19/23 at 1132  Med List Status: <None>   Medication Order Taking? Sig Documenting Provider Last Dose Status Informant  acetaminophen  (TYLENOL ) 325 MG tablet [id number]  Take 2 tablets (650 mg total) by mouth every 4 (four) hours as needed for moderate pain. Patsy Lenis, MD  Active            Med Note RANELLE, KEENE HERO   Thu Jun 30, 2023  1:34 PM)    amoxicillin  (AMOXIL ) 500 MG capsule [id number]  Take 1 capsule (500 mg total) by mouth 3 (three) times daily. Dennise Kingsley, MD  Active   atorvastatin  (LIPITOR) 20 MG tablet [id number]  TAKE 1 TABLET BY MOUTH EVERY DAY [name], MD  Active   cetirizine (ZYRTEC) 10 MG tablet [id number]  Take 10 mg by mouth daily. [provider]  Active   digoxin  (LANOXIN ) 0.125 MG tablet [id number]  TAKE 1 TABLET BY MOUTH EVERY DAY Sabharwal, Aditya, DO  Active   ELIQUIS  5 MG TABS tablet [id number]  TAKE 1 TABLET BY MOUTH 2 TIMES A DAY Sabharwal, Aditya, DO  Active   empagliflozin  (JARDIANCE ) 25 MG TABS tablet [id number]  Take 1 tablet (25 mg total) by mouth daily before breakfast. [name], MD  Active   furosemide  (LASIX ) 20 MG tablet [id number]  Take 1 tablet (20 mg total) by mouth daily. Patient is taking every other day. Sabharwal, Aditya, DO  Active   HYDROcodone -acetaminophen  (NORCO/VICODIN) 5-325 MG tablet [id number] Yes Take 1 tablet by mouth every 6 (six) hours as needed for moderate pain (pain score 4-6). Lalonde, John C, MD  Active   metoprolol  succinate (TOPROL -XL) 100 MG 24 hr tablet [id number]  TAKE 1 TABLET BY MOUTH EVERY DAY (TAKE WITH OR IMMEDIATELY FOLLOWING A MEAL) Sabharwal, Aditya, DO  Active   omeprazole  (PRILOSEC  OTC) 20 MG tablet [id number]  Take 1 tablet (20 mg total) by mouth daily. Elicia Claw, MD  Active   ondansetron  (ZOFRAN -ODT) 4 MG disintegrating tablet [id number] Yes Take 1  tablet (4 mg total) by mouth every 8 (eight) hours as needed for nausea or vomiting. Lalonde, John C, MD  Active   oxymetazoline (AFRIN) 0.05 % nasal spray [id number]  Place 1 spray into both nostrils at bedtime as needed for congestion. [provider]  Active Self, Pharmacy Records, Spouse/Significant Other           Med Note RANELLE KEENE HERO [place] Jun 30, 2023  1:37 PM)    polyethylene glycol powder (GLYCOLAX /MIRALAX ) 17 GM/SCOOP powder [id number]  Take 17 g by mouth daily as needed for mild constipation. Cherlyn Labella, MD  Active Self, Pharmacy Records, Spouse/Significant Other           Med Note RANELLE KEENE HERO [place] Jun 30, 2023  1:36 PM)    spironolactone  (ALDACTONE ) 25 MG tablet [id number]  Take 1 tablet (25 mg total) by mouth daily. NEEDS FOLLOW UP APPOINTMENT FOR MORE REFILLS Gardenia Led, DO  Active             Recommendation:   Specialty provider follow-up with Interventional Radiology at [facility] on 09/20/23 at 11:00 AM as directed for IR PARA/THORACENTSIS  Follow Up Plan:   Telephone follow up appointment date/time:  Friday, September 12 at 12:15 PM  Clayborne Morgan RN BSN CCM  [facility]  [facility], [facility] Nurse Care Coordinator  Direct Dial: [phone] Website: [email or web address]

## 2023-09-19 NOTE — Progress Notes (Signed)
 [facility]  Call to patient to inform him that per Johnston, plan is to proceed with a thoracentesis and for patient to expect call from scheduling. Mrs. Garverick asked if a PET was still planned, informed her that PET will depend on the cytology results from thoracentesis. Spouse gave her verbal understanding. They deny further questions at this time. Encouraged to call with questions/concerns.   Colene KYM Raider, RN, BSN, [facility] Nurse Navigator, [facility] 09/19/2023 10:19 AM

## 2023-09-20 ENCOUNTER — Other Ambulatory Visit: Payer: Self-pay | Admitting: Physician Assistant

## 2023-09-20 ENCOUNTER — Ambulatory Visit (HOSPITAL_COMMUNITY)
Admission: RE | Admit: 2023-09-20 | Discharge: 2023-09-20 | Disposition: A | Discharge status: Home / Self Care | Source: Ambulatory Visit | Attending: Physician Assistant | Admitting: Physician Assistant

## 2023-09-20 DIAGNOSIS — J90 Pleural effusion, not elsewhere classified: Secondary | ICD-10-CM

## 2023-09-20 LAB — PROSTATE-SPECIFIC AG, SERUM (LABCORP): Prostate Specific Ag, Serum: <0.1 ng/mL (ref 0.0–4.0)

## 2023-09-20 NOTE — Progress Notes (Signed)
 PROCEDURE SUMMARY:  Small right-sided pleural effusion. No significant pocket of fluid or percutaneous window to allow safe thoracentesis. Risks outweigh the benefits.   Please see imaging section of Epic for full dictation.  Priyah Schmuck NP 09/20/2023 1:24 PM

## 2023-09-21 ENCOUNTER — Encounter: Payer: Self-pay | Admitting: Medical Oncology

## 2023-09-26 ENCOUNTER — Encounter (HOSPITAL_COMMUNITY)
Admission: RE | Admit: 2023-09-26 | Discharge: 2023-09-26 | Disposition: A | Discharge status: Home / Self Care | Source: Ambulatory Visit | Attending: Physician Assistant | Admitting: Physician Assistant

## 2023-09-26 DIAGNOSIS — J90 Pleural effusion, not elsewhere classified: Secondary | ICD-10-CM | POA: Diagnosis not present

## 2023-09-26 DIAGNOSIS — I517 Cardiomegaly: Secondary | ICD-10-CM | POA: Diagnosis not present

## 2023-09-26 DIAGNOSIS — R590 Localized enlarged lymph nodes: Secondary | ICD-10-CM | POA: Insufficient documentation

## 2023-09-26 DIAGNOSIS — I700 Atherosclerosis of aorta: Secondary | ICD-10-CM | POA: Diagnosis not present

## 2023-09-26 DIAGNOSIS — N200 Calculus of kidney: Secondary | ICD-10-CM | POA: Insufficient documentation

## 2023-09-26 DIAGNOSIS — R911 Solitary pulmonary nodule: Secondary | ICD-10-CM | POA: Diagnosis not present

## 2023-09-26 DIAGNOSIS — I2510 Atherosclerotic heart disease of native coronary artery without angina pectoris: Secondary | ICD-10-CM | POA: Insufficient documentation

## 2023-09-26 LAB — GLUCOSE, CAPILLARY: Glucose-Capillary: 108 mg/dL — ABNORMAL HIGH (ref 70–99)

## 2023-09-26 MED ORDER — FLUDEOXYGLUCOSE F - 18 (FDG) INJECTION
9.2500 | Freq: Once | INTRAVENOUS | Status: AC
  Administered 2023-09-26: 9.25 via INTRAVENOUS

## 2023-09-27 ENCOUNTER — Encounter

## 2023-09-28 ENCOUNTER — Other Ambulatory Visit (HOSPITAL_COMMUNITY): Payer: Self-pay | Admitting: Physician Assistant

## 2023-09-28 ENCOUNTER — Inpatient Hospital Stay: Admitting: Physician Assistant

## 2023-09-28 VITALS — BP 101/68 | HR 98 | Temp 97.7°F | Resp 18 | Wt 182.1 lb

## 2023-09-28 DIAGNOSIS — Z800 Family history of malignant neoplasm of digestive organs: Secondary | ICD-10-CM | POA: Diagnosis not present

## 2023-09-28 DIAGNOSIS — R630 Anorexia: Secondary | ICD-10-CM | POA: Diagnosis not present

## 2023-09-28 DIAGNOSIS — Z801 Family history of malignant neoplasm of trachea, bronchus and lung: Secondary | ICD-10-CM | POA: Diagnosis not present

## 2023-09-28 DIAGNOSIS — J90 Pleural effusion, not elsewhere classified: Secondary | ICD-10-CM | POA: Diagnosis not present

## 2023-09-28 DIAGNOSIS — J949 Pleural condition, unspecified: Secondary | ICD-10-CM | POA: Diagnosis not present

## 2023-09-28 DIAGNOSIS — Z87891 Personal history of nicotine dependence: Secondary | ICD-10-CM | POA: Diagnosis not present

## 2023-09-28 DIAGNOSIS — R911 Solitary pulmonary nodule: Secondary | ICD-10-CM | POA: Diagnosis not present

## 2023-09-28 DIAGNOSIS — Z8546 Personal history of malignant neoplasm of prostate: Secondary | ICD-10-CM | POA: Diagnosis not present

## 2023-09-28 MED ORDER — MIRTAZAPINE 15 MG PO TABS: 15.0000 mg | ORAL_TABLET | Freq: Every day | ORAL | 0 refills | Status: DC

## 2023-09-28 NOTE — Progress Notes (Signed)
 [facility] [phone]   [phone]  PROGRESS NOTE:  Patient Care Team: [name], MD as PCP - General (Family Medicine) Verlin Lonni BIRCH, MD as PCP - Cardiology (Cardiology) Cindie Ole DASEN, MD as PCP - Electrophysiology (Cardiology) Devere Lonni Righter, MD as Consulting Physician (Urology) Grayce Buddle, RN Nurse Navigator as Registered Nurse (Medical Oncology) Patrcia Cough, MD as Consulting Physician (Radiation Oncology) Lionell Jon DEL, [facility] (Pharmacist) Morgan, Clayborne CROME, RN as VBCI Care Management  CHIEF COMPLAINTS/PURPOSE OF CONSULTATION:  Right pleural effusion/nodularity   HISTORY OF PRESENTING ILLNESS:  [name] 73 y.o. male returns for a follow up for right pleural effusion.  He is accompanied by his wife.  On exam today, [name] reports his appetite continue to be low and he has lost  another 4 labs since the last visit. He is fatigued which impacts his ADLs. He continues to have nausea controlled with his prescribed antiemetics. He denies easy bruising or signs of active bleeding.  His right sided mid back pain is controlled with prescribed Norco. He continues to have shortness of breath with exertion.  He denies fevers, chills, night sweats, cough, headaches, dizziness or neuropathy. He has no other complaints. Rest of the ROS is below.   MEDICAL HISTORY:  Past Medical History:  Diagnosis Date   BCE (basal cell epithelioma), arm    RIGHT SHOULDER   Bradycardia    Bumps on skin    on left side of nose for last 3 weeks   CAD (coronary artery disease)    a. s/p CABGx2 10/11/18 LIMA to LAD, SVG to RCA, EVH via right thigh.   Dyslipidemia    Dysrhythmia    aflutter/afib   Ectatic thoracic aorta (HCC)    Esophageal stricture    GERD (gastroesophageal reflux disease)    Heart murmur    sees dr jerrald   Hyperlipidemia    Incidental pulmonary nodule, > 3mm and < 8mm 10/04/2018    Noted on CTA   Inguinal hernia    bilateral   Obesity    Presence of permanent cardiac pacemaker    Prostate cancer (HCC)    S/P aortic valve replacement with bioprosthetic valve 10/11/2018   23 mm Edwards Inspiris Resilia stented bovine pericardial tissue valve, miltral valve done also   S/P CABG x 2 10/11/2018   LIMA to LAD, SVG to RCA, EVH via right thigh   S/P mitral valve replacement with bioprosthetic valve 10/11/2018   27 mm Medtronic Mosaic stented porcine bioprosthetic tissue valve   S/P patent foramen ovale closure 10/11/2018   S/P placement of cardiac pacemaker    Smoker    Stroke [facility])     SURGICAL HISTORY: Past Surgical History:  Procedure Laterality Date   AORTIC VALVE REPLACEMENT N/A 10/11/2018   Procedure: AORTIC VALVE REPLACEMENT (AVR) with 23 Inspiris Bioprosthetic Aortic valve.;  Surgeon: Dusty Sudie DEL, MD;  Location: [facility] OR;  Service: Open Heart Surgery;  Laterality: N/A;   AV FISTULA PLACEMENT Left 06/15/2022   Procedure: LEFT UPPER EXTREMITY THROMBECTOMY;  Surgeon: Sheree Penne Lonni, MD;  Location: [facility] OR;  Service: Vascular;  Laterality: Left;   BIOPSY  03/22/2023   Procedure: BIOPSY;  Surgeon: Elicia Claw, MD;  Location: [facility] ENDOSCOPY;  Service: Gastroenterology;;   CLIPPING OF ATRIAL APPENDAGE N/A 10/11/2018   Procedure: CLIPPING OF LEFT ATRIAL APPENDAGE with 45 AtriCure Clip.;  Surgeon: Dusty Sudie DEL, MD;  Location: [facility] OR;  Service: Open Heart  Surgery;  Laterality: N/A;   COLONOSCOPY  03/2008,2004   Dr. Dyane   COLONOSCOPY WITH PROPOFOL  N/A 03/22/2023   Procedure: COLONOSCOPY WITH PROPOFOL ;  Surgeon: Elicia Claw, MD;  Location: [facility] ENDOSCOPY;  Service: Gastroenterology;  Laterality: N/A;   CORONARY ARTERY BYPASS GRAFT N/A 10/11/2018   Procedure: CORONARY ARTERY BYPASS GRAFTING (CABG) x 2 using LIMA to the LAD and endoscopic greater saphenous vein harvesting to the RCA.;  Surgeon: Dusty Sudie DEL, MD;  Location: [facility] OR;  Service: Open Heart  Surgery;  Laterality: N/A;   CYSTOSCOPY  04/04/2019   Procedure: CYSTOSCOPY;  Surgeon: Devere Lonni Righter, MD;  Location: [facility];  Service: Urology;;   ESOPHAGOGASTRODUODENOSCOPY (EGD) WITH PROPOFOL  N/A 03/22/2023   Procedure: ESOPHAGOGASTRODUODENOSCOPY (EGD) WITH PROPOFOL ;  Surgeon: Elicia Claw, MD;  Location: [facility];  Service: Gastroenterology;  Laterality: N/A;   HERNIA REPAIR  12/11/10   BIH   MITRAL VALVE REPLACEMENT N/A 10/11/2018   Procedure: MITRAL VALVE (MV) REPLACEMENT with 27 Mosaic Bioprosthetic Mitral valve.;  Surgeon: Dusty Sudie DEL, MD;  Location: [facility] OR;  Service: Open Heart Surgery;  Laterality: N/A;   PACEMAKER IMPLANT N/A 10/17/2018   Procedure: PACEMAKER IMPLANT;  Surgeon: Kelsie Agent, MD;  [facility] INV[facility];  Service: Cardiovascular;  Laterality: N/A;   POLYPECTOMY  03/22/2023   Procedure: POLYPECTOMY;  Surgeon: Elicia Claw, MD;  Location: [facility];  Service: Gastroenterology;;   RADIOACTIVE SEED IMPLANT N/A 04/04/2019   Procedure: RADIOACTIVE SEED IMPLANT/BRACHYTHERAPY IMPLANT, cystoscopy;  Surgeon: Devere Lonni Righter, MD;  Location: [facility];  Service: Urology;  Laterality: N/A;  58 seeds   REPAIR OF PATENT FORAMEN OVALE N/A 10/11/2018   Procedure: CLOSURE OF PATENT FORAMEN OVALE;  Surgeon: Dusty Sudie DEL, MD;  Location: [facility] OR;  Service: Open Heart Surgery;  Laterality: N/A;   RIGHT/LEFT HEART CATH AND CORONARY ANGIOGRAPHY N/A 09/15/2018   Procedure: RIGHT/LEFT HEART CATH AND CORONARY ANGIOGRAPHY;  Surgeon: Verlin Lonni BIRCH, MD;  [facility]VASIVE CV LAB;  Service: Cardiovascular;  Laterality: N/A;   RIGHT/LEFT HEART CATH AND CORONARY/GRAFT ANGIOGRAPHY N/A 05/25/2022   Procedure: RIGHT/LEFT HEART CATH AND CORONARY/GRAFT ANGIOGRAPHY;  Surgeon: Verlin Lonni BIRCH, MD;  [facility] LAB;  Service: Cardiovascular;  Laterality: N/A;   TEE WITHOUT CARDIOVERSION N/A 12/12/2015    Procedure: TRANSESOPHAGEAL ECHOCARDIOGRAM (TEE);  Surgeon: Jerel Balding, MD;  [facility]COPY;  Service: Cardiovascular;  Laterality: N/A;   TEE WITHOUT CARDIOVERSION N/A 10/11/2018   Procedure: TRANSESOPHAGEAL ECHOCARDIOGRAM (TEE);  Surgeon: Dusty Sudie DEL, MD;  Location: [facility] OR;  Service: Open Heart Surgery;  Laterality: N/A;   TEE WITHOUT CARDIOVERSION N/A 06/16/2022   Procedure: TRANSESOPHAGEAL ECHOCARDIOGRAM;  Surgeon: Gardenia Led, DO;  Location: [facility] INVASIVE CV LAB;  Service: Cardiovascular;  Laterality: N/A;   THYMECTOMY  1976   radaiation tx done    SOCIAL HISTORY: Social History   Socioeconomic History   Marital status: Married    Spouse name: Not on file   Number of children: 0   Years of education: Not on file   Highest education level: Bachelor's degree (e.g., BA, AB, BS)  Occupational History   Occupation: Market researcher: [employer]  Tobacco Use   Smoking status: Former    Average packs/day: 0.3 packs/day for 40.6 years (14.0 ttl pk-yrs)    Types: Cigarettes    Start date: 02/08/1978    Passive exposure: Never   Smokeless tobacco: Never  Vaping Use   Vaping status: Never Used  Substance and Sexual Activity   Alcohol use: Yes    Alcohol/week: 1.0 standard drink of alcohol    Types: 1 Cans of beer per week    Comment: rare   Drug use: No   Sexual activity: Yes  Other Topics Concern   Not on file  Social History Narrative   Not on file   Social Drivers of Health   Financial Resource Strain: Low Risk  (08/25/2023)   Overall Financial Resource Strain (CARDIA)    Difficulty of Paying Living Expenses: Not hard at all  Food Insecurity: No Food Insecurity (08/25/2023)   Hunger Vital Sign    Worried About Running Out of Food in the Last Year: Never true    Ran Out of Food in the Last Year: Never true  Transportation Needs: No Transportation Needs (08/25/2023)   PRAPARE - Administrator, Civil Service (Medical): No    Lack of  Transportation (Non-Medical): No  Physical Activity: Insufficiently Active (08/25/2023)   Exercise Vital Sign    Days of Exercise per Week: 1 day    Minutes of Exercise per Session: 10 min  Stress: Stress Concern Present (08/25/2023)   [employer] of Occupational Health - Occupational Stress Questionnaire    Feeling of Stress: To some extent  Social Connections: Socially Integrated (08/25/2023)   Social Connection and Isolation Panel    Frequency of Communication with Friends and Family: More than three times a week    Frequency of Social Gatherings with Friends and Family: Once a week    Attends Religious Services: More than 4 times per year    Active Member of [employer] or Organizations: Yes    Attends Engineer, structural: More than 4 times per year    Marital Status: Married  Catering manager Violence: Not At Risk (07/05/2023)   Humiliation, Afraid, Rape, and Kick questionnaire    Fear of Current or Ex-Partner: No    Emotionally Abused: No    Physically Abused: No    Sexually Abused: No    FAMILY HISTORY: Family History  Problem Relation Age of Onset   Colon cancer Mother    Lung cancer Father        smoker   CAD Neg Hx    Pancreatic cancer Neg Hx    Breast cancer Neg Hx     ALLERGIES:  is allergic to losartan .  MEDICATIONS:  Current Outpatient Medications  Medication Sig Dispense Refill   acetaminophen  (TYLENOL ) 325 MG tablet Take 2 tablets (650 mg total) by mouth every 4 (four) hours as needed for moderate pain.     amoxicillin  (AMOXIL ) 500 MG capsule Take 1 capsule (500 mg total) by mouth 3 (three) times daily. 90 capsule 5   atorvastatin  (LIPITOR) 20 MG tablet TAKE 1 TABLET BY MOUTH EVERY DAY 90 tablet 3   cetirizine (ZYRTEC) 10 MG tablet Take 10 mg by mouth daily.     digoxin  (LANOXIN ) 0.125 MG tablet TAKE 1 TABLET BY MOUTH EVERY DAY 90 tablet 3   ELIQUIS  5 MG TABS tablet TAKE 1 TABLET BY MOUTH 2 TIMES A DAY 60 tablet 11   empagliflozin  (JARDIANCE ) 25 MG  TABS tablet Take 1 tablet (25 mg total) by mouth daily before breakfast. 90 tablet 1   furosemide  (LASIX ) 20 MG tablet Take 1 tablet (20 mg total) by mouth daily. Patient is taking every other day. 90 tablet 0   HYDROcodone -acetaminophen  (NORCO/VICODIN) 5-325 MG tablet Take 1 tablet by mouth every 6 (six)  hours as needed for moderate pain (pain score 4-6). 30 tablet 0   metoprolol  succinate (TOPROL -XL) 100 MG 24 hr tablet TAKE 1 TABLET BY MOUTH EVERY DAY (TAKE WITH OR IMMEDIATELY FOLLOWING A MEAL) 90 tablet 3   omeprazole  (PRILOSEC  OTC) 20 MG tablet Take 1 tablet (20 mg total) by mouth daily. 90 tablet 3   ondansetron  (ZOFRAN -ODT) 4 MG disintegrating tablet Take 1 tablet (4 mg total) by mouth every 8 (eight) hours as needed for nausea or vomiting. 30 tablet 1   oxymetazoline (AFRIN) 0.05 % nasal spray Place 1 spray into both nostrils at bedtime as needed for congestion.     polyethylene glycol powder (GLYCOLAX /MIRALAX ) 17 GM/SCOOP powder Take 17 g by mouth daily as needed for mild constipation. 238 g 0   spironolactone  (ALDACTONE ) 25 MG tablet Take 1 tablet (25 mg total) by mouth daily. NEEDS FOLLOW UP APPOINTMENT FOR MORE REFILLS 90 tablet 0   No current facility-administered medications for this visit.    REVIEW OF SYSTEMS:   Constitutional: ( - ) fevers, ( - )  chills , ( - ) night sweats Eyes: ( - ) blurriness of vision, ( - ) double vision, ( - ) watery eyes Ears, nose, mouth, throat, and face: ( - ) mucositis, ( - ) sore throat Respiratory: ( - ) cough, ( + ) dyspnea, ( - ) wheezes Cardiovascular: ( - ) palpitation, ( - ) chest discomfort, ( - ) lower extremity swelling Gastrointestinal:  (+) nausea, ( - ) heartburn, ( - ) change in bowel habits Skin: ( - ) abnormal skin rashes Lymphatics: ( - ) new lymphadenopathy, ( - ) easy bruising Neurological: ( - ) numbness, ( - ) tingling, ( - ) new weaknesses Behavioral/Psych: ( - ) mood change, ( - ) new changes  All other systems were  reviewed with the patient and are negative.  PHYSICAL EXAMINATION: ECOG PERFORMANCE STATUS: 1 - Symptomatic but completely ambulatory  Vitals:   09/28/23 0900 09/28/23 1000  BP: 101/68   Pulse: 98   Resp: 18   Temp: 97.7 F (36.5 C)   SpO2: 90% 95%   Filed Weights   09/28/23 0900  Weight: 182 lb 1.6 oz (82.6 kg)    GENERAL: well appearing male in NAD  SKIN: skin color, texture, turgor are normal, no rashes or significant lesions EYES: conjunctiva are pink and non-injected, sclera clear LUNGS: decreased breath sounds in right side. No wheezing, crackles bilaterally.  HEART: regular rate & rhythm and no murmurs and no lower extremity edema Musculoskeletal: no cyanosis of digits and no clubbing  PSYCH: alert & oriented x 3, fluent speech NEURO: no focal motor/sensory deficits  LABORATORY DATA:  I have reviewed the data as listed    Latest Ref Rng & Units 09/16/2023    2:52 PM 08/30/2023   10:07 AM 08/09/2023    2:55 PM  CBC  WBC 4.0 - 10.5 K/uL 8.4  7.5  8.7   Hemoglobin 13.0 - 17.0 g/dL 86.3  86.6  86.2   Hematocrit 39.0 - 52.0 % 42.7  42.6  42.5   Platelets 150 - 400 K/uL 277  245  277        Latest Ref Rng & Units 09/16/2023    2:52 PM 08/30/2023   10:07 AM 08/09/2023    2:55 PM  CMP  Glucose 70 - 99 mg/dL 870  893  862   BUN 8 - 23 mg/dL 19  19  17  Creatinine 0.61 - 1.24 mg/dL 8.90  8.83  8.89   Sodium 135 - 145 mmol/L 134  133  135   Potassium 3.5 - 5.1 mmol/L 4.9  4.6  4.2   Chloride 98 - 111 mmol/L 97  96  100   CO2 22 - 32 mmol/L 28  20  24    Calcium  8.9 - 10.3 mg/dL 9.4  9.4  9.3   Total Protein 6.5 - 8.1 g/dL 8.1  7.3  7.8   Total Bilirubin 0.0 - 1.2 mg/dL 0.9  0.9  1.0   Alkaline Phos 38 - 126 U/L 121  143    AST 15 - 41 U/L 17  19  15    ALT 0 - 44 U/L 9  10  12       RADIOGRAPHIC STUDIES: I have personally reviewed the radiological images as listed and agreed with the findings in the report. NM PET Image Initial (PI) Skull Base To Thigh Result Date:  09/26/2023 CLINICAL DATA:  Initial treatment strategy for right pleural thickening. EXAM: NUCLEAR MEDICINE PET SKULL BASE TO THIGH TECHNIQUE: 9.3 mCi F-18 FDG was injected intravenously. Full-ring PET imaging was performed from the skull base to thigh after the radiotracer. CT data was obtained and used for attenuation correction and anatomic localization. Fasting blood glucose: 108 mg/dl COMPARISON:  CT chest 91/94/7974 and CT abdomen pelvis. FINDINGS: Mediastinal blood pool activity: SUV max 2.8 Liver activity: SUV max NA NECK: No abnormal hypermetabolism. Incidental CT findings: None. CHEST: Extensive hypermetabolic nodular pleural thickening throughout the right hemithorax, SUV max 12.2. 7 mm apical left upper lobe nodule (4/59), SUV max 1.5. Hypermetabolic low right paratracheal lymph node measures 1.4 cm, SUV max 4.0. No additional abnormal hypermetabolism. Incidental CT findings: Atherosclerotic calcification of the aorta and coronary arteries. Aortic valve replacement. Heart is enlarged. No pericardial effusion. Small right pleural effusion. ABDOMEN/PELVIS: No abnormal hypermetabolism. Incidental CT findings: Punctate left renal stone.  Radiotherapy seeds in the prostate. SKELETON: No abnormal hypermetabolism. Incidental CT findings: Degenerative changes in the spine. IMPRESSION: 1. Extensive hypermetabolic nodular pleural thickening in the right hemithorax with hypermetabolic ipsilateral mediastinal adenopathy, findings likely due to metastatic prostate cancer. Primary bronchogenic carcinoma is not excluded. 2. 7 mm apical left upper lobe nodule does not show metabolism above blood pool but is too small for PET resolution. Recommend attention on follow-up as a metastasis is suspected. 3. Small associated right pleural effusion. 4. Punctate left renal stone. 5. Aortic atherosclerosis (ICD10-I70.0). Coronary artery calcification. Electronically Signed   By: Newell Eke M.D.   On: 09/26/2023 16:36   IR  US  CHEST Result Date: 09/20/2023 INDICATION: Patient on lasix  with suspected malignant right effusion with request for diagnostic thoracentesis. EXAM: CHEST ULTRASOUND COMPARISON:  09/10/23 CT Chest FINDINGS: Small right-sided pleural effusion. No significant pocket of fluid or percutaneous window to allow safe thoracentesis. Risks outweigh the benefits. IMPRESSION: Small right-sided pleural effusion with no safe window for percutaneous access. Performed by Laymon Coast, NP Electronically Signed   By: Ester Sides M.D.   On: 09/20/2023 13:53   CT CHEST WO CONTRAST Result Date: 09/15/2023 CLINICAL DATA:  Chest pain. Right pleural effusion. Prostate carcinoma. * Tracking Code: BO * EXAM: CT CHEST WITHOUT CONTRAST TECHNIQUE: Multidetector CT imaging of the chest was performed following the standard protocol without IV contrast. RADIATION DOSE REDUCTION: This exam was performed according to the departmental dose-optimization program which includes automated exposure control, adjustment of the mA and/or kV according to patient size and/or use of  iterative reconstruction technique. COMPARISON:  10/04/2018 FINDINGS: Cardiovascular: Mild cardiomegaly. Pacemaker leads in right heart. Prior CABG. 4.0 cm ascending thoracic aortic aneurysm, increased in size from 3.7 cm previously. Mediastinum/Nodes: Mild mediastinal lymphadenopathy noted with largest lymph nodes in the right paratracheal region measuring 1.5 cm and subcarinal region measuring 1.7 cm, without significant change since previous study. No definite hilar lymphadenopathy identified on this unenhanced exam. Lungs/Pleura: Diffuse nodular soft tissue thickening is seen throughout right pleural space and tracking along the fissures, with small amount of low-attenuation pleural fluid seen. This is highly suspicious for a malignant pleural effusion. Right lung volume loss and atelectasis noted, however, central tracheobronchial airways remain patent and no  centrally obstructing mass is identified. A 7 mm mildly spiculated nodule is seen in the apical left upper lobe on image 23/8. No other left lung masses or pleural effusion identified. Upper Abdomen:  Unremarkable. Musculoskeletal: No suspicious bone lesions. Chronic appearing compression deformities of several mid and lower thoracic vertebral bodies are noted. IMPRESSION: Diffuse nodular soft tissue thickening and small amount of fluid throughout the right pleural space and tracking along the fissures, highly suspicious for a malignant pleural effusion. Right lung volume loss and atelectasis, however no centrally obstructing mass identified. Mild mediastinal lymphadenopathy, without significant change since prior study in 2020. 7 mm mildly spiculated pulmonary nodule in left upper lobe. Malignancy cannot be excluded. Consider PET-CT for further evaluation. Electronically Signed   By: Norleen DELENA Kil M.D.   On: 09/15/2023 11:46   CT ABDOMEN PELVIS W CONTRAST Result Date: 09/08/2023 CLINICAL DATA:  Diffuse abdominal pain and nausea. History of prostate carcinoma. * Tracking Code: BO * EXAM: CT ABDOMEN AND PELVIS WITH CONTRAST TECHNIQUE: Multidetector CT imaging of the abdomen and pelvis was performed using the standard protocol following bolus administration of intravenous contrast. RADIATION DOSE REDUCTION: This exam was performed according to the departmental dose-optimization program which includes automated exposure control, adjustment of the mA and/or kV according to patient size and/or use of iterative reconstruction technique. CONTRAST:  100mL ISOVUE -300 IOPAMIDOL  (ISOVUE -300) INJECTION 61% COMPARISON:  05/06/2022 FINDINGS: Lower Chest: Small pleural effusion marked diffuse nodular pleural soft tissue thickening is seen, suspicious for a malignant pleural effusion. Mild left basilar atelectasis also noted. Hepatobiliary: No suspicious hepatic masses identified. Gallbladder is unremarkable. No evidence of  biliary ductal dilatation. Pancreas:  No mass or inflammatory changes. Spleen: Within normal limits in size and appearance. Adrenals/Urinary Tract: Normal adrenal glands. Mild right renal parenchymal scarring noted. No renal masses identified. No evidence of ureteral calculi or hydronephrosis. Unremarkable unopacified urinary bladder. Stomach/Bowel: No evidence of obstruction, inflammatory process or abnormal fluid collections. Vascular/Lymphatic: No pathologically enlarged lymph nodes. No acute vascular findings. Reproductive:  Small prostate gland noted with brachytherapy seeds. Other:  None. Musculoskeletal:  No suspicious bone lesions identified. IMPRESSION: No acute findings within the abdomen or pelvis. Small left pleural effusion with marked diffuse nodular pleural soft tissue thickening, suspicious for a malignant pleural effusion. Chest CT with contrast is recommended for further evaluation. These results will be called to the ordering clinician or representative by the Radiologist Assistant, and communication documented in the PACS or [employer]. Electronically Signed   By: Norleen DELENA Kil M.D.   On: 09/08/2023 19:11    ASSESSMENT & PLAN [name] is a 73 y.o. side effects who returns for a follow up for right pleural effusion/nodularity.   #Right pleural effusion/nodularity: --Etiologies include infectious process, inflammatory process, malignant process --Was evaluated by IR for thoracentesis but procedure  was aborted due no safe window for percutaneous access.  --PET/CT scan  from 09/26/2023 showed extensive hypermetabolic nodular pleural thickening in the right hemithorax with hypermetabolic ipsilateral mediastinal adenopathy.  --Discussed obtaining a pleural biopsy through IR versus CT surgery. If not amenable, we will recommend close observation with repeat imaging in 2-3 months.  --RTC once work up is complete.   #Appetite loss/weight loss: --Discussed appetite stimulants.  Marinol is not available due to national shortage. Don't recommend megace due to thrombotic risk with potential malignant diagnosis.  --Sent remeron  15 mg PO daily     No orders of the defined types were placed in this encounter.   All questions were answered. The patient knows to call the clinic with any problems, questions or concerns.  I have spent a total of 25 minutes minutes of face-to-face and non-face-to-face time, preparing to see the patient, performing a medically appropriate examination, counseling and educating the patient, ordering medications/tests/procedures, referring and communicating with other health care professionals, documenting clinical information in the electronic health record, independently interpreting results and communicating results to the patient, and care coordination.   Johnston Police, PA-C Department of Hematology/Oncology [facility] Phone: [phone]

## 2023-09-29 ENCOUNTER — Encounter: Payer: Self-pay | Admitting: Physician Assistant

## 2023-09-29 ENCOUNTER — Encounter: Payer: Self-pay | Admitting: Medical Oncology

## 2023-09-29 ENCOUNTER — Other Ambulatory Visit: Payer: Self-pay | Admitting: Physician Assistant

## 2023-09-29 ENCOUNTER — Telehealth: Payer: Self-pay | Admitting: Physician Assistant

## 2023-09-29 DIAGNOSIS — J949 Pleural condition, unspecified: Secondary | ICD-10-CM

## 2023-09-29 NOTE — Progress Notes (Unsigned)
 Michael Sieving, MD  [name] [name]  CT core R pleural mass Extensive disease, see PET  [facility]       Previous Messages    ----- Message ----- From: [name] Sent: 09/28/2023   4:56 PM EDT To: [name]; Taryn F Rigney, RT; Ir Proc* Subject: CT Biopsy                                      Procedure :CT Biopsy  Reason :right pleural biopsy, Dx: Disorder of pleura   History :NM PET Image Initial (PI) Skull Base To Thigh,IR US  CHEST ,CT CHEST WO CONTRAST ,CT ABDOMEN PELVIS W CONTRAST ,DG Chest 2 View  Provider: Neomi Johnston DASEN, PA-C  Provider contact ;  [phone]

## 2023-09-29 NOTE — Telephone Encounter (Signed)
 I spoke to patient's wife, Mrs. Amis, to review the plan to obtain biopsy. IR will attempt CT guided core biopsy of pleural nodularity, awaiting to be scheduled. If CT biopsy is not successful, then we need to assess for surgical biopsy. Patient is scheduled with Dr. Kerrin next week to discuss.

## 2023-10-04 ENCOUNTER — Other Ambulatory Visit (HOSPITAL_COMMUNITY): Payer: Self-pay | Admitting: Radiology

## 2023-10-04 ENCOUNTER — Ambulatory Visit
Attending: Thoracic Surgery (Cardiothoracic Vascular Surgery) | Admitting: Thoracic Surgery (Cardiothoracic Vascular Surgery)

## 2023-10-04 VITALS — BP 96/64 | HR 95 | Resp 20 | Ht 67.0 in | Wt 182.0 lb

## 2023-10-04 DIAGNOSIS — R590 Localized enlarged lymph nodes: Secondary | ICD-10-CM | POA: Diagnosis not present

## 2023-10-04 DIAGNOSIS — C61 Malignant neoplasm of prostate: Secondary | ICD-10-CM

## 2023-10-04 NOTE — Progress Notes (Signed)
 PCP is [name], MD Referring Provider is Neomi Johnston ONEIDA DEVONNA  Chief Complaint  Patient presents with   Mediastinal Adenopathy    Surgical consult,PET Scan 09/26/23/ Chest CT 09/13/23    HPI: [name] is sent for consultation for possible surgical biopsy.  [name] is a 73 year old man with a history of CAD, CABG, AVR, MVR, chest radiation, endocarditis treated medically, pacemaker, hyperlipidemia, prostate cancer, reflux, esophageal stricture, thoracic aortic atherosclerosis, stroke, and a right pleural mass.  First developed a persistent cough in March.  Had a chest x-ray which at this time showed a small right pleural effusion.  Cough has persisted.  Over time he developed shortness of breath, dizzy spells, fatigue, loss of appetite, and a 40 pound weight loss over a period of about 3 months.  He had a CT of the abdomen and pelvis.  Noted to have a right pleural effusion although report states the left.  Then had a CT of the chest which showed a pleural mass encasing the right lung.  PET/CT showed the mass was hypermetabolic.  There was also hypermetabolic adenopathy.  He is scheduled to have a CT-guided biopsy at [facility] tomorrow. Past Medical History:  Diagnosis Date   BCE (basal cell epithelioma), arm    RIGHT SHOULDER   Bradycardia    Bumps on skin    on left side of nose for last 3 weeks   CAD (coronary artery disease)    a. s/p CABGx2 10/11/18 LIMA to LAD, SVG to RCA, EVH via right thigh.   Dyslipidemia    Dysrhythmia    aflutter/afib   Ectatic thoracic aorta (HCC)    Esophageal stricture    GERD (gastroesophageal reflux disease)    Heart murmur    sees dr jerrald   Hyperlipidemia    Incidental pulmonary nodule, > 3mm and < 8mm 10/04/2018   Noted on CTA   Inguinal hernia    bilateral   Obesity    Presence of permanent cardiac pacemaker    Prostate cancer (HCC)    S/P aortic valve replacement with bioprosthetic valve 10/11/2018   23 mm Edwards Inspiris  Resilia stented bovine pericardial tissue valve, miltral valve done also   S/P CABG x 2 10/11/2018   LIMA to LAD, SVG to RCA, EVH via right thigh   S/P mitral valve replacement with bioprosthetic valve 10/11/2018   27 mm Medtronic Mosaic stented porcine bioprosthetic tissue valve   S/P patent foramen ovale closure 10/11/2018   S/P placement of cardiac pacemaker    Smoker    Stroke [facility])     Past Surgical History:  Procedure Laterality Date   AORTIC VALVE REPLACEMENT N/A 10/11/2018   Procedure: AORTIC VALVE REPLACEMENT (AVR) with 23 Inspiris Bioprosthetic Aortic valve.;  Surgeon: Dusty Sudie DEL, MD;  Location: [facility] OR;  Service: Open Heart Surgery;  Laterality: N/A;   AV FISTULA PLACEMENT Left 06/15/2022   Procedure: LEFT UPPER EXTREMITY THROMBECTOMY;  Surgeon: Sheree Penne Bruckner, MD;  Location: [facility] OR;  Service: Vascular;  Laterality: Left;   BIOPSY  03/22/2023   Procedure: BIOPSY;  Surgeon: Elicia Claw, MD;  Location: [facility] ENDOSCOPY;  Service: Gastroenterology;;   CLIPPING OF ATRIAL APPENDAGE N/A 10/11/2018   Procedure: CLIPPING OF LEFT ATRIAL APPENDAGE with 45 AtriCure Clip.;  Surgeon: Dusty Sudie DEL, MD;  Location: [facility] OR;  Service: Open Heart Surgery;  Laterality: N/A;   COLONOSCOPY  03/2008,2004   Dr. Dyane   COLONOSCOPY WITH PROPOFOL  N/A 03/22/2023   Procedure: COLONOSCOPY  WITH PROPOFOL ;  Surgeon: Elicia Claw, MD;  Location: [facility] ENDOSCOPY;  Service: Gastroenterology;  Laterality: N/A;   CORONARY ARTERY BYPASS GRAFT N/A 10/11/2018   Procedure: CORONARY ARTERY BYPASS GRAFTING (CABG) x 2 using LIMA to the LAD and endoscopic greater saphenous vein harvesting to the RCA.;  Surgeon: Dusty Sudie DEL, MD;  Location: [facility] OR;  Service: Open Heart Surgery;  Laterality: N/A;   CYSTOSCOPY  04/04/2019   Procedure: CYSTOSCOPY;  Surgeon: Devere Lonni Righter, MD;  Location: [facility];  Service: Urology;;   ESOPHAGOGASTRODUODENOSCOPY (EGD) WITH PROPOFOL  N/A 03/22/2023    Procedure: ESOPHAGOGASTRODUODENOSCOPY (EGD) WITH PROPOFOL ;  Surgeon: Elicia Claw, MD;  Location: [facility]OS[facility];  Service: Gastroenterology;  Laterality: N/A;   HERNIA REPAIR  12/11/10   BIH   MITRAL VALVE REPLACEMENT N/A 10/11/2018   Procedure: MITRAL VALVE (MV) REPLACEMENT with 27 Mosaic Bioprosthetic Mitral valve.;  Surgeon: Dusty Sudie DEL, MD;  Location: [facility] OR;  Service: Open Heart Surgery;  Laterality: N/A;   PACEMAKER IMPLANT N/A 10/17/2018   Procedure: PACEMAKER IMPLANT;  Surgeon: Kelsie Agent, MD;  Location: [facility] INV[facility] CV LAB;  Service: Cardiovascular;  Laterality: N/A;   POLYPECTOMY  03/22/2023   Procedure: POLYPECTOMY;  Surgeon: Elicia Claw, MD;  Location: [facility]OS[facility];  Service: Gastroenterology;;   RADIOACTIVE SEED IMPLANT N/A 04/04/2019   Procedure: RADIOACTIVE SEED IMPLANT/BRACHYTHERAPY IMPLANT, cystoscopy;  Surgeon: Devere Lonni Righter, MD;  Location: [facility];  Service: Urology;  Laterality: N/A;  58 seeds   REPAIR OF PATENT FORAMEN OVALE N/A 10/11/2018   Procedure: CLOSURE OF PATENT FORAMEN OVALE;  Surgeon: Dusty Sudie DEL, MD;  Location: [facility] OR;  Service: Open Heart Surgery;  Laterality: N/A;   RIGHT/LEFT HEART CATH AND CORONARY ANGIOGRAPHY N/A 09/15/2018   Procedure: RIGHT/LEFT HEART CATH AND CORONARY ANGIOGRAPHY;  Surgeon: Verlin Lonni BIRCH, MD;  Location: [facility] INVAS[facility] CV LAB;  Service: Cardiovascular;  Laterality: N/A;   RIGHT/LEFT HEART CATH AND CORONARY/GRAFT ANGIOGRAPHY N/A 05/25/2022   Procedure: RIGHT/LEFT HEART CATH AND CORONARY/GRAFT ANGIOGRAPHY;  Surgeon: Verlin Lonni BIRCH, MD;  Location: [facility] CV LAB;  Service: Cardiovascular;  Laterality: N/A;   TEE WITHOUT CARDIOVERSION N/A 12/12/2015   Procedure: TRANSESOPHAGEAL ECHOCARDIOGRAM (TEE);  Surgeon: Jerel Balding, MD;  Location: [facility] ENDOSCOPY;  Service: Cardiovascular;  Laterality: N/A;   TEE WITHOUT CARDIOVERSION N/A 10/11/2018   Procedure: TRANSESOPHAGEAL ECHOCARDIOGRAM (TEE);   Surgeon: Dusty Sudie DEL, MD;  Location: [facility] OR;  Service: Open Heart Surgery;  Laterality: N/A;   TEE WITHOUT CARDIOVERSION N/A 06/16/2022   Procedure: TRANSESOPHAGEAL ECHOCARDIOGRAM;  Surgeon: Gardenia Led, DO;  Location: [facility] INVASIVE CV LAB;  Service: Cardiovascular;  Laterality: N/A;   THYMECTOMY  1976   radaiation tx done    Family History  Problem Relation Age of Onset   Colon cancer Mother    Lung cancer Father        smoker   CAD Neg Hx    Pancreatic cancer Neg Hx    Breast cancer Neg Hx     Social History Social History   Tobacco Use   Smoking status: Former    Average packs/day: 0.3 packs/day for 40.6 years (14.0 ttl pk-yrs)    Types: Cigarettes    Start date: 02/08/1978    Passive exposure: Never   Smokeless tobacco: Never  Vaping Use   Vaping status: Never Used  Substance Use Topics   Alcohol use: Yes    Alcohol/week: 1.0 standard drink of alcohol    Types: 1 Cans of beer per week  Comment: rare   Drug use: No    Current Outpatient Medications  Medication Sig Dispense Refill   acetaminophen  (TYLENOL ) 325 MG tablet Take 2 tablets (650 mg total) by mouth every 4 (four) hours as needed for moderate pain.     amoxicillin  (AMOXIL ) 500 MG capsule Take 1 capsule (500 mg total) by mouth 3 (three) times daily. 90 capsule 5   atorvastatin  (LIPITOR) 20 MG tablet TAKE 1 TABLET BY MOUTH EVERY DAY 90 tablet 3   cetirizine (ZYRTEC) 10 MG tablet Take 10 mg by mouth daily.     digoxin  (LANOXIN ) 0.125 MG tablet TAKE 1 TABLET BY MOUTH EVERY DAY 90 tablet 3   ELIQUIS  5 MG TABS tablet TAKE 1 TABLET BY MOUTH 2 TIMES A DAY 60 tablet 11   empagliflozin  (JARDIANCE ) 25 MG TABS tablet Take 1 tablet (25 mg total) by mouth daily before breakfast. 90 tablet 1   furosemide  (LASIX ) 20 MG tablet Take 1 tablet (20 mg total) by mouth daily. Patient is taking every other day. 90 tablet 0   HYDROcodone -acetaminophen  (NORCO/VICODIN) 5-325 MG tablet Take 1 tablet by mouth every 6 (six) hours as  needed for moderate pain (pain score 4-6). 30 tablet 0   metoprolol  succinate (TOPROL -XL) 100 MG 24 hr tablet TAKE 1 TABLET BY MOUTH EVERY DAY (TAKE WITH OR IMMEDIATELY FOLLOWING A MEAL) 90 tablet 3   mirtazapine  (REMERON ) 15 MG tablet Take 1 tablet (15 mg total) by mouth at bedtime. 30 tablet 0   omeprazole  (PRILOSEC  OTC) 20 MG tablet Take 1 tablet (20 mg total) by mouth daily. 90 tablet 3   ondansetron  (ZOFRAN -ODT) 4 MG disintegrating tablet Take 1 tablet (4 mg total) by mouth every 8 (eight) hours as needed for nausea or vomiting. 30 tablet 1   oxymetazoline (AFRIN) 0.05 % nasal spray Place 1 spray into both nostrils at bedtime as needed for congestion.     polyethylene glycol powder (GLYCOLAX /MIRALAX ) 17 GM/SCOOP powder Take 17 g by mouth daily as needed for mild constipation. 238 g 0   spironolactone  (ALDACTONE ) 25 MG tablet Take 1 tablet (25 mg total) by mouth daily. NEEDS FOLLOW UP APPOINTMENT FOR MORE REFILLS 90 tablet 0   No current facility-administered medications for this visit.    Allergies  Allergen Reactions   Losartan  Rash    Review of Systems  Constitutional:  Positive for activity change, appetite change, fatigue and unexpected weight change.  HENT:  Negative for trouble swallowing.   Respiratory:  Positive for cough and shortness of breath.   Cardiovascular:  Negative for chest pain.  Musculoskeletal:  Positive for gait problem.  Neurological:  Positive for dizziness and weakness.  Hematological:  Bruises/bleeds easily (On Eliquis ).    BP 96/64   Pulse 95   Resp 20   Ht 5' 7 (1.702 m)   Wt 182 lb (82.6 kg)   SpO2 90% Comment: RA  BMI 28.51 kg/m  Physical Exam Vitals reviewed.  Constitutional:      General: He is not in acute distress.    Comments: Frail  HENT:     Head: Normocephalic and atraumatic.  Eyes:     General: No scleral icterus.    Extraocular Movements: Extraocular movements intact.  Cardiovascular:     Rate and Rhythm: Normal rate and  regular rhythm.     Heart sounds: Murmur heard.  Pulmonary:     Breath sounds: No wheezing.     Comments: Diminished but not completely absent breath sounds on right  Abdominal:     General: There is no distension.     Palpations: Abdomen is soft.     Tenderness: There is no abdominal tenderness.  Musculoskeletal:     Comments: Thenar wasting  Lymphadenopathy:     Cervical: No cervical adenopathy.  Skin:    General: Skin is warm and dry.     Findings: Bruising present.  Neurological:     General: No focal deficit present.     Mental Status: He is alert and oriented to person, place, and time.     Cranial Nerves: No cranial nerve deficit.      Diagnostic Tests: NUCLEAR MEDICINE PET SKULL BASE TO THIGH   TECHNIQUE: 9.3 mCi F-18 FDG was injected intravenously. Full-ring PET imaging was performed from the skull base to thigh after the radiotracer. CT data was obtained and used for attenuation correction and anatomic localization.   Fasting blood glucose: 108 mg/dl   COMPARISON:  CT chest 09/13/2023 and CT abdomen pelvis.   FINDINGS: Mediastinal blood pool activity: SUV max 2.8   Liver activity: SUV max NA   NECK:   No abnormal hypermetabolism.   Incidental CT findings:   None.   CHEST:   Extensive hypermetabolic nodular pleural thickening throughout the right hemithorax, SUV max 12.2. 7 mm apical left upper lobe nodule (4/59), SUV max 1.5. Hypermetabolic low right paratracheal lymph node measures 1.4 cm, SUV max 4.0. No additional abnormal hypermetabolism.   Incidental CT findings:   Atherosclerotic calcification of the aorta and coronary arteries. Aortic valve replacement. Heart is enlarged. No pericardial effusion. Small right pleural effusion.   ABDOMEN/PELVIS:   No abnormal hypermetabolism.   Incidental CT findings:   Punctate left renal stone.  Radiotherapy seeds in the prostate.   SKELETON:   No abnormal hypermetabolism.   Incidental CT  findings:   Degenerative changes in the spine.   IMPRESSION: 1. Extensive hypermetabolic nodular pleural thickening in the right hemithorax with hypermetabolic ipsilateral mediastinal adenopathy, findings likely due to metastatic prostate cancer. Primary bronchogenic carcinoma is not excluded. 2. 7 mm apical left upper lobe nodule does not show metabolism above blood pool but is too small for PET resolution. Recommend attention on follow-up as a metastasis is suspected. 3. Small associated right pleural effusion. 4. Punctate left renal stone. 5. Aortic atherosclerosis (ICD10-I70.0). Coronary artery calcification.     Electronically Signed   By: Newell Eke M.D.   On: 09/26/2023 16:36 I personally reviewed the CT images.  Extensive nodular pleural thickening right hemithorax with adenopathy.  Severe aortic and coronary atherosclerosis.  Previous valve replacement.  Impression: [name] is a 73 year old man with a history of CAD, CABG, AVR, MVR, chest radiation, endocarditis treated medically, pacemaker, hyperlipidemia, prostate cancer, reflux, esophageal stricture, thoracic aortic atherosclerosis, stroke, and a right pleural mass.  Right pleural-based mass-encases the entire right lung.  First impression would be malignant mesothelioma but has no history of asbestos exposure.  Metastatic prostate cancer or metastatic lung cancer would also be in the differential.  Not surgically resectable.  I see no reason that IR would not be able to obtain a core biopsy and diagnosis.  If for some reason they are unable to then we could do a surgical biopsy.  I discussed possible surgical biopsy with Mr. and Mrs. Knoth.  I informed them of the general nature of the procedure including the need for general anesthesia, the incision to be used, the fact that this is diagnostic and not therapeutic, and potential brief  hospital stay.  I informed him of the indications, risks, benefits, and  alternatives.  They understand the risks include, but not limited to death, MI, DVT, PE, bleeding, possible need for transfusion, infection, lung injury, as well as possibility of other unstable complications.  They will call if the IR biopsy is unsuccessful.   Plan: Proceed with IR biopsy No results patient will call to schedule surgical biopsy.  Elspeth JAYSON Millers, MD [facility] [phone]

## 2023-10-04 NOTE — Progress Notes (Signed)
 Patient for CT guided Lung mass biopsy on Wed 10/05/23, I called and spoke with the patient's wife, Clarita on the phone and gave pre-procedure instructions. Clarita was made aware to have the patient here at 10a, last dose of Eliquis  was Sunday 10/02/23, NPO after MN prior to procedure as well as driver post procedure/recovery/discharge. Clarita stated understanding.  Called 10/04/23

## 2023-10-05 ENCOUNTER — Ambulatory Visit
Admission: RE | Admit: 2023-10-05 | Discharge: 2023-10-05 | Disposition: A | Discharge status: Home / Self Care | Source: Ambulatory Visit | Attending: Physician Assistant | Admitting: Physician Assistant

## 2023-10-05 ENCOUNTER — Ambulatory Visit
Admission: RE | Admit: 2023-10-05 | Discharge: 2023-10-05 | Disposition: A | Discharge status: Home / Self Care | Source: Ambulatory Visit | Attending: Diagnostic Radiology | Admitting: Diagnostic Radiology

## 2023-10-05 ENCOUNTER — Other Ambulatory Visit: Payer: Self-pay

## 2023-10-05 DIAGNOSIS — J929 Pleural plaque without asbestos: Secondary | ICD-10-CM | POA: Diagnosis not present

## 2023-10-05 DIAGNOSIS — Z951 Presence of aortocoronary bypass graft: Secondary | ICD-10-CM | POA: Diagnosis not present

## 2023-10-05 DIAGNOSIS — J949 Pleural condition, unspecified: Secondary | ICD-10-CM | POA: Diagnosis not present

## 2023-10-05 DIAGNOSIS — C450 Mesothelioma of pleura: Secondary | ICD-10-CM | POA: Insufficient documentation

## 2023-10-05 DIAGNOSIS — Z48813 Encounter for surgical aftercare following surgery on the respiratory system: Secondary | ICD-10-CM | POA: Diagnosis not present

## 2023-10-05 DIAGNOSIS — Z8546 Personal history of malignant neoplasm of prostate: Secondary | ICD-10-CM | POA: Insufficient documentation

## 2023-10-05 DIAGNOSIS — I2510 Atherosclerotic heart disease of native coronary artery without angina pectoris: Secondary | ICD-10-CM | POA: Diagnosis not present

## 2023-10-05 DIAGNOSIS — R590 Localized enlarged lymph nodes: Secondary | ICD-10-CM | POA: Insufficient documentation

## 2023-10-05 DIAGNOSIS — Z79899 Other long term (current) drug therapy: Secondary | ICD-10-CM | POA: Diagnosis not present

## 2023-10-05 DIAGNOSIS — R918 Other nonspecific abnormal finding of lung field: Secondary | ICD-10-CM | POA: Diagnosis not present

## 2023-10-05 DIAGNOSIS — R0989 Other specified symptoms and signs involving the circulatory and respiratory systems: Secondary | ICD-10-CM | POA: Diagnosis not present

## 2023-10-05 DIAGNOSIS — Z950 Presence of cardiac pacemaker: Secondary | ICD-10-CM | POA: Insufficient documentation

## 2023-10-05 DIAGNOSIS — Z8673 Personal history of transient ischemic attack (TIA), and cerebral infarction without residual deficits: Secondary | ICD-10-CM | POA: Insufficient documentation

## 2023-10-05 DIAGNOSIS — Z87891 Personal history of nicotine dependence: Secondary | ICD-10-CM | POA: Diagnosis not present

## 2023-10-05 DIAGNOSIS — Z952 Presence of prosthetic heart valve: Secondary | ICD-10-CM | POA: Insufficient documentation

## 2023-10-05 DIAGNOSIS — J90 Pleural effusion, not elsewhere classified: Secondary | ICD-10-CM | POA: Diagnosis not present

## 2023-10-05 DIAGNOSIS — C61 Malignant neoplasm of prostate: Secondary | ICD-10-CM

## 2023-10-05 LAB — CBC
HCT: 43.8 % (ref 39.0–52.0)
Hemoglobin: 13.2 g/dL (ref 13.0–17.0)
MCH: 24.2 pg — ABNORMAL LOW (ref 26.0–34.0)
MCHC: 30.1 g/dL (ref 30.0–36.0)
MCV: 80.2 fL (ref 80.0–100.0)
Platelets: 268 K/uL (ref 150–400)
RBC: 5.46 MIL/uL (ref 4.22–5.81)
RDW: 17.4 % — ABNORMAL HIGH (ref 11.5–15.5)
WBC: 7.9 K/uL (ref 4.0–10.5)
nRBC: 0 % (ref 0.0–0.2)

## 2023-10-05 LAB — PROTIME-INR
INR: 1.2 (ref 0.8–1.2)
Prothrombin Time: 16 s — ABNORMAL HIGH (ref 11.4–15.2)

## 2023-10-05 MED ORDER — FENTANYL CITRATE (PF) 100 MCG/2ML IJ SOLN
INTRAMUSCULAR | Status: AC
Start: 2023-10-05 — End: 2023-10-05
  Filled 2023-10-05: qty 4

## 2023-10-05 MED ORDER — MIDAZOLAM HCL 2 MG/2ML IJ SOLN
INTRAMUSCULAR | Status: AC | PRN
  Administered 2023-10-05: .5 mg via INTRAVENOUS
  Administered 2023-10-05: 1 mg via INTRAVENOUS
  Administered 2023-10-05: .5 mg via INTRAVENOUS

## 2023-10-05 MED ORDER — MIDAZOLAM HCL 2 MG/2ML IJ SOLN
INTRAMUSCULAR | Status: AC
  Filled 2023-10-05: qty 4

## 2023-10-05 MED ORDER — FENTANYL CITRATE (PF) 100 MCG/2ML IJ SOLN
INTRAMUSCULAR | Status: AC | PRN
  Administered 2023-10-05 (×2): 25 ug via INTRAVENOUS
  Administered 2023-10-05: 50 ug via INTRAVENOUS

## 2023-10-05 MED ORDER — SODIUM CHLORIDE 0.9 % IV SOLN: INTRAVENOUS | Status: DC

## 2023-10-05 NOTE — Progress Notes (Signed)
 Portable chest x-ray done at bedside

## 2023-10-05 NOTE — Procedures (Signed)
 Interventional Radiology Procedure:   Indications: Right pleural thickening  Procedure: CT guided biopsy of right pleural thickening  Findings: Diffuse right pleural thickening.  Core biopsies obtained from posterolateral right pleural thickening.  No pneumothorax on post biopsy images.  Complications: None     EBL: Minimal  Plan: CXR in 1 hour.  Plan for discharge after 2 pm  Samiksha Pellicano R. Philip, MD  Pager: [phone]

## 2023-10-05 NOTE — H&P (Signed)
 Chief Complaint: Patient was seen in consultation today for right pleural thickening   Procedure: CT Lung Biopsy, right  Referring Physician(s): Neomi Johnston DASEN  Supervising Physician: Philip Cornet  Patient Status: [facility] - Out-pt  History of Present Illness: [name] is a 73 y.o. male with a history of prostate cancer, stroke, CAD s/p CABG x 2, aortic valve replacement, and permanent pacemaker who presented to his PCP in July with complaints of nausea for the past several months. Patient was reportedly taking some of his wife's Zofran  to help with with his nausea. CT A/P completed on 7/29 and showed no abnormalities of the abdomen or pelvis, but was significant for diffuse nodular pleural soft tissue thickening of the right lung. Subsequent CT chest on 8/5 with similar findings highly suspicious for malignancy. PET scan completed on 8/18 with extensive hypermetabolic nodular pleural thickening in the right hemithorax with hypermetabolic ipsilateral mediastinal adenopathy, findings likely due to metastatic prostate cancer or primary bronchogenic carcinoma. Patient referred to IR for lung biopsy.   Patient resting in bed with wife at the bedside. States that for the past several months he has had worsening shortness of breath, central back pain that radiates to his right flank, and night sweats. His wife also reports a 40lb weight loss since May and increased respiratory effort at rest. Patient takes hydrocodone  for back pain which he reports keeps his pain under control. He denies any chest pain, palpitations, abdominal pain, changes in bowel/bladder habits, or fevers/chills. NPO since midnight. All questions and concerns answered at the bedside.   Code Status: Full Code  Past Medical History:  Diagnosis Date   BCE (basal cell epithelioma), arm    RIGHT SHOULDER   Bradycardia    Bumps on skin    on left side of nose for last 3 weeks   CAD (coronary artery disease)    a. s/p CABGx2  10/11/18 LIMA to LAD, SVG to RCA, EVH via right thigh.   Dyslipidemia    Dysrhythmia    aflutter/afib   Ectatic thoracic aorta (HCC)    Esophageal stricture    GERD (gastroesophageal reflux disease)    Heart murmur    sees dr jerrald   Hyperlipidemia    Incidental pulmonary nodule, > 3mm and < 8mm 10/04/2018   Noted on CTA   Inguinal hernia    bilateral   Obesity    Presence of permanent cardiac pacemaker    Prostate cancer (HCC)    S/P aortic valve replacement with bioprosthetic valve 10/11/2018   23 mm Edwards Inspiris Resilia stented bovine pericardial tissue valve, miltral valve done also   S/P CABG x 2 10/11/2018   LIMA to LAD, SVG to RCA, EVH via right thigh   S/P mitral valve replacement with bioprosthetic valve 10/11/2018   27 mm Medtronic Mosaic stented porcine bioprosthetic tissue valve   S/P patent foramen ovale closure 10/11/2018   S/P placement of cardiac pacemaker 2020   Smoker    Stroke [facility])     Past Surgical History:  Procedure Laterality Date   AORTIC VALVE REPLACEMENT N/A 10/11/2018   Procedure: AORTIC VALVE REPLACEMENT (AVR) with 23 Inspiris Bioprosthetic Aortic valve.;  Surgeon: Dusty Sudie DEL, MD;  Location: [facility] OR;  Service: Open Heart Surgery;  Laterality: N/A;   AV FISTULA PLACEMENT Left 06/15/2022   Procedure: LEFT UPPER EXTREMITY THROMBECTOMY;  Surgeon: Sheree Penne Bruckner, MD;  Location: [facility] OR;  Service: Vascular;  Laterality: Left;   BIOPSY  03/22/2023  Procedure: BIOPSY;  Surgeon: Elicia Claw, MD;  Location: [facility];  Service: Gastroenterology;;   CLIPPING OF ATRIAL APPENDAGE N/A 10/11/2018   Procedure: CLIPPING OF LEFT ATRIAL APPENDAGE with 45 AtriCure Clip.;  Surgeon: Dusty Sudie DEL, MD;  Location: [facility] OR;  Service: Open Heart Surgery;  Laterality: N/A;   COLONOSCOPY  03/2008,2004   Dr. Dyane   COLONOSCOPY WITH PROPOFOL  N/A 03/22/2023   Procedure: COLONOSCOPY WITH PROPOFOL ;  Surgeon: Elicia Claw, MD;  Location: [facility];   Service: Gastroenterology;  Laterality: N/A;   CORONARY ARTERY BYPASS GRAFT N/A 10/11/2018   Procedure: CORONARY ARTERY BYPASS GRAFTING (CABG) x 2 using LIMA to the LAD and endoscopic greater saphenous vein harvesting to the RCA.;  Surgeon: Dusty Sudie DEL, MD;  Location: [facility] OR;  Service: Open Heart Surgery;  Laterality: N/A;   CYSTOSCOPY  04/04/2019   Procedure: CYSTOSCOPY;  Surgeon: Devere Lonni Righter, MD;  Location: [facility];  Service: Urology;;   ESOPHAGOGASTRODUODENOSCOPY (EGD) WITH PROPOFOL  N/A 03/22/2023   Procedure: ESOPHAGOGASTRODUODENOSCOPY (EGD) WITH PROPOFOL ;  Surgeon: Elicia Claw, MD;  Location: [facility];  Service: Gastroenterology;  Laterality: N/A;   HERNIA REPAIR  12/11/10   BIH   MITRAL VALVE REPLACEMENT N/A 10/11/2018   Procedure: MITRAL VALVE (MV) REPLACEMENT with 27 Mosaic Bioprosthetic Mitral valve.;  Surgeon: Dusty Sudie DEL, MD;  Location: [facility] OR;  Service: Open Heart Surgery;  Laterality: N/A;   PACEMAKER IMPLANT N/A 10/17/2018   Procedure: PACEMAKER IMPLANT;  Surgeon: Kelsie Agent, MD;  [facility];  Service: Cardiovascular;  Laterality: N/A;   POLYPECTOMY  03/22/2023   Procedure: POLYPECTOMY;  Surgeon: Elicia Claw, MD;  Location: [facility];  Service: Gastroenterology;;   RADIOACTIVE SEED IMPLANT N/A 04/04/2019   Procedure: RADIOACTIVE SEED IMPLANT/BRACHYTHERAPY IMPLANT, cystoscopy;  Surgeon: Devere Lonni Righter, MD;  Location: [facility];  Service: Urology;  Laterality: N/A;  58 seeds   REPAIR OF PATENT FORAMEN OVALE N/A 10/11/2018   Procedure: CLOSURE OF PATENT FORAMEN OVALE;  Surgeon: Dusty Sudie DEL, MD;  Location: [facility] OR;  Service: Open Heart Surgery;  Laterality: N/A;   RIGHT/LEFT HEART CATH AND CORONARY ANGIOGRAPHY N/A 09/15/2018   Procedure: RIGHT/LEFT HEART CATH AND CORONARY ANGIOGRAPHY;  Surgeon: Verlin Lonni BIRCH, MD;  [facility]: [facility];  Service: Cardiovascular;  Laterality:  N/A;   RIGHT/LEFT HEART CATH AND CORONARY/GRAFT ANGIOGRAPHY N/A 05/25/2022   Procedure: RIGHT/LEFT HEART CATH AND CORONARY/GRAFT ANGIOGRAPHY;  Surgeon: Verlin Lonni BIRCH, MD;  [facility];  Service: Cardiovascular;  Laterality: N/A;   TEE WITHOUT CARDIOVERSION N/A 12/12/2015   Procedure: TRANSESOPHAGEAL ECHOCARDIOGRAM (TEE);  Surgeon: Jerel Balding, MD;  Location: [facility] ENDOSCOPY;  Service: Cardiovascular;  Laterality: N/A;   TEE WITHOUT CARDIOVERSION N/A 10/11/2018   Procedure: TRANSESOPHAGEAL ECHOCARDIOGRAM (TEE);  Surgeon: Dusty Sudie DEL, MD;  Location: [facility] OR;  Service: Open Heart Surgery;  Laterality: N/A;   TEE WITHOUT CARDIOVERSION N/A 06/16/2022   Procedure: TRANSESOPHAGEAL ECHOCARDIOGRAM;  Surgeon: Gardenia Led, DO;  Location: [facility];  Service: Cardiovascular;  Laterality: N/A;   THYMECTOMY  1976   radaiation tx done    Allergies: Losartan   Medications: Prior to Admission medications   Medication Sig Start Date End Date Taking? Authorizing Provider  acetaminophen  (TYLENOL ) 325 MG tablet Take 2 tablets (650 mg total) by mouth every 4 (four) hours as needed for moderate pain. 06/22/22  Yes Patsy Lenis, MD  amoxicillin  (AMOXIL ) 500 MG capsule Take 1 capsule (500 mg total) by mouth 3 (  three) times daily. 08/09/23  Yes Dennise Kingsley, MD  atorvastatin  (LIPITOR) 20 MG tablet TAKE 1 TABLET BY MOUTH EVERY DAY 07/21/23  Yes Lalonde, John C, MD  cetirizine (ZYRTEC) 10 MG tablet Take 10 mg by mouth daily.   Yes [provider]  digoxin  (LANOXIN ) 0.125 MG tablet TAKE 1 TABLET BY MOUTH EVERY DAY 03/02/23  Yes Sabharwal, Aditya, DO  empagliflozin  (JARDIANCE ) 25 MG TABS tablet Take 1 tablet (25 mg total) by mouth daily before breakfast. 05/25/23  Yes [name], MD  furosemide  (LASIX ) 20 MG tablet Take 1 tablet (20 mg total) by mouth daily. Patient is taking every other day. 07/22/23  Yes Sabharwal, Aditya, DO  HYDROcodone -acetaminophen  (NORCO/VICODIN)  5-325 MG tablet Take 1 tablet by mouth every 6 (six) hours as needed for moderate pain (pain score 4-6). 09/15/23  Yes [name], MD  metoprolol  succinate (TOPROL -XL) 100 MG 24 hr tablet TAKE 1 TABLET BY MOUTH EVERY DAY (TAKE WITH OR IMMEDIATELY FOLLOWING A MEAL) 08/11/23  Yes Sabharwal, Aditya, DO  mirtazapine  (REMERON ) 15 MG tablet Take 1 tablet (15 mg total) by mouth at bedtime. 09/28/23  Yes Thayil, Irene T, PA-C  omeprazole  (PRILOSEC  OTC) 20 MG tablet Take 1 tablet (20 mg total) by mouth daily. 03/22/23 03/21/24 Yes Brahmbhatt, Parag, MD  ondansetron  (ZOFRAN -ODT) 4 MG disintegrating tablet Take 1 tablet (4 mg total) by mouth every 8 (eight) hours as needed for nausea or vomiting. 09/15/23  Yes Lalonde, John C, MD  oxymetazoline (AFRIN) 0.05 % nasal spray Place 1 spray into both nostrils at bedtime as needed for congestion.   Yes [provider]  polyethylene glycol powder (GLYCOLAX /MIRALAX ) 17 GM/SCOOP powder Take 17 g by mouth daily as needed for mild constipation. 05/26/22  Yes Akula, Vijaya, MD  spironolactone  (ALDACTONE ) 25 MG tablet Take 1 tablet (25 mg total) by mouth daily. NEEDS FOLLOW UP APPOINTMENT FOR MORE REFILLS 05/24/23  Yes Sabharwal, Aditya, DO  ELIQUIS  5 MG TABS tablet TAKE 1 TABLET BY MOUTH 2 TIMES A DAY 08/08/23   Sabharwal, Aditya, DO     Family History  Problem Relation Age of Onset   Colon cancer Mother    Lung cancer Father        smoker   CAD Neg Hx    Pancreatic cancer Neg Hx    Breast cancer Neg Hx     Social History   Socioeconomic History   Marital status: Married    Spouse name: Clarita   Number of children: 0   Years of education: Not on file   Highest education level: Bachelor's degree (e.g., BA, AB, BS)  Occupational History   Occupation: Market researcher: [employer]  Tobacco Use   Smoking status: Former    Average packs/day: 0.3 packs/day for 40.6 years (14.0 ttl pk-yrs)    Types: Cigarettes    Start date: 02/08/1978    Passive  exposure: Never   Smokeless tobacco: Never  Vaping Use   Vaping status: Never Used  Substance and Sexual Activity   Alcohol use: Yes    Alcohol/week: 1.0 standard drink of alcohol    Types: 1 Cans of beer per week    Comment: rare   Drug use: No   Sexual activity: Yes  Other Topics Concern   Not on file  Social History Narrative   Lives with wife at home    Social Drivers of Health   Financial Resource Strain: Low Risk  (08/25/2023)   Overall Financial Resource  Strain (CARDIA)    Difficulty of Paying Living Expenses: Not hard at all  Food Insecurity: No Food Insecurity (08/25/2023)   Hunger Vital Sign    Worried About Running Out of Food in the Last Year: Never true    Ran Out of Food in the Last Year: Never true  Transportation Needs: No Transportation Needs (08/25/2023)   PRAPARE - Administrator, Civil Service (Medical): No    Lack of Transportation (Non-Medical): No  Physical Activity: Insufficiently Active (08/25/2023)   Exercise Vital Sign    Days of Exercise per Week: 1 day    Minutes of Exercise per Session: 10 min  Stress: Stress Concern Present (08/25/2023)   [employer] of Occupational Health - Occupational Stress Questionnaire    Feeling of Stress: To some extent  Social Connections: Socially Integrated (08/25/2023)   Social Connection and Isolation Panel    Frequency of Communication with Friends and Family: More than three times a week    Frequency of Social Gatherings with Friends and Family: Once a week    Attends Religious Services: More than 4 times per year    Active Member of [employer] or Organizations: Yes    Attends Engineer, structural: More than 4 times per year    Marital Status: Married    Review of Systems  Constitutional:  Positive for unexpected weight change.  Respiratory:  Positive for shortness of breath.   Genitourinary:  Positive for flank pain (right sided).  Musculoskeletal:  Positive for back pain (radiates to the  right).  Denies any N/V, chest pain, abdominal pain, fevers/chills. All other ROS negative.  Vital Signs: BP 122/80   Pulse 99   Temp 98 F (36.7 C) (Tympanic)   Ht 5' 7 (1.702 m)   Wt 181 lb 9.6 oz (82.4 kg)   SpO2 93%   BMI 28.44 kg/m    Physical Exam Constitutional:      Appearance: Normal appearance.  HENT:     Mouth/Throat:     Mouth: Mucous membranes are moist.     Pharynx: Oropharynx is clear.  Cardiovascular:     Rate and Rhythm: Normal rate and regular rhythm.     Heart sounds: Murmur heard.     Comments: Pacemaker palpable under skin Pulmonary:     Effort: Pulmonary effort is normal.     Comments: Diminished breath sounds on the right  Abdominal:     General: Abdomen is flat.     Palpations: Abdomen is soft.     Tenderness: There is no abdominal tenderness.     Comments: Right rib/flank tender to palpation  Skin:    General: Skin is warm and dry.  Neurological:     Mental Status: He is alert and oriented to person, place, and time.  Psychiatric:        Mood and Affect: Mood normal.        Behavior: Behavior normal.     Imaging: NM PET Image Initial (PI) Skull Base To Thigh Result Date: 09/26/2023 CLINICAL DATA:  Initial treatment strategy for right pleural thickening. EXAM: NUCLEAR MEDICINE PET SKULL BASE TO THIGH TECHNIQUE: 9.3 mCi F-18 FDG was injected intravenously. Full-ring PET imaging was performed from the skull base to thigh after the radiotracer. CT data was obtained and used for attenuation correction and anatomic localization. Fasting blood glucose: 108 mg/dl COMPARISON:  CT chest 91/94/7974 and CT abdomen pelvis. FINDINGS: Mediastinal blood pool activity: SUV max 2.8 Liver activity: SUV max NA  NECK: No abnormal hypermetabolism. Incidental CT findings: None. CHEST: Extensive hypermetabolic nodular pleural thickening throughout the right hemithorax, SUV max 12.2. 7 mm apical left upper lobe nodule (4/59), SUV max 1.5. Hypermetabolic low right  paratracheal lymph node measures 1.4 cm, SUV max 4.0. No additional abnormal hypermetabolism. Incidental CT findings: Atherosclerotic calcification of the aorta and coronary arteries. Aortic valve replacement. Heart is enlarged. No pericardial effusion. Small right pleural effusion. ABDOMEN/PELVIS: No abnormal hypermetabolism. Incidental CT findings: Punctate left renal stone.  Radiotherapy seeds in the prostate. SKELETON: No abnormal hypermetabolism. Incidental CT findings: Degenerative changes in the spine. IMPRESSION: 1. Extensive hypermetabolic nodular pleural thickening in the right hemithorax with hypermetabolic ipsilateral mediastinal adenopathy, findings likely due to metastatic prostate cancer. Primary bronchogenic carcinoma is not excluded. 2. 7 mm apical left upper lobe nodule does not show metabolism above blood pool but is too small for PET resolution. Recommend attention on follow-up as a metastasis is suspected. 3. Small associated right pleural effusion. 4. Punctate left renal stone. 5. Aortic atherosclerosis (ICD10-I70.0). Coronary artery calcification. Electronically Signed   By: Newell Eke M.D.   On: 09/26/2023 16:36   IR US  CHEST Result Date: 09/20/2023 INDICATION: Patient on lasix  with suspected malignant right effusion with request for diagnostic thoracentesis. EXAM: CHEST ULTRASOUND COMPARISON:  09/10/23 CT Chest FINDINGS: Small right-sided pleural effusion. No significant pocket of fluid or percutaneous window to allow safe thoracentesis. Risks outweigh the benefits. IMPRESSION: Small right-sided pleural effusion with no safe window for percutaneous access. Performed by Laymon Coast, NP Electronically Signed   By: Ester Sides M.D.   On: 09/20/2023 13:53   CT CHEST WO CONTRAST Result Date: 09/15/2023 CLINICAL DATA:  Chest pain. Right pleural effusion. Prostate carcinoma. * Tracking Code: BO * EXAM: CT CHEST WITHOUT CONTRAST TECHNIQUE: Multidetector CT imaging of the chest was  performed following the standard protocol without IV contrast. RADIATION DOSE REDUCTION: This exam was performed according to the departmental dose-optimization program which includes automated exposure control, adjustment of the mA and/or kV according to patient size and/or use of iterative reconstruction technique. COMPARISON:  10/04/2018 FINDINGS: Cardiovascular: Mild cardiomegaly. Pacemaker leads in right heart. Prior CABG. 4.0 cm ascending thoracic aortic aneurysm, increased in size from 3.7 cm previously. Mediastinum/Nodes: Mild mediastinal lymphadenopathy noted with largest lymph nodes in the right paratracheal region measuring 1.5 cm and subcarinal region measuring 1.7 cm, without significant change since previous study. No definite hilar lymphadenopathy identified on this unenhanced exam. Lungs/Pleura: Diffuse nodular soft tissue thickening is seen throughout right pleural space and tracking along the fissures, with small amount of low-attenuation pleural fluid seen. This is highly suspicious for a malignant pleural effusion. Right lung volume loss and atelectasis noted, however, central tracheobronchial airways remain patent and no centrally obstructing mass is identified. A 7 mm mildly spiculated nodule is seen in the apical left upper lobe on image 23/8. No other left lung masses or pleural effusion identified. Upper Abdomen:  Unremarkable. Musculoskeletal: No suspicious bone lesions. Chronic appearing compression deformities of several mid and lower thoracic vertebral bodies are noted. IMPRESSION: Diffuse nodular soft tissue thickening and small amount of fluid throughout the right pleural space and tracking along the fissures, highly suspicious for a malignant pleural effusion. Right lung volume loss and atelectasis, however no centrally obstructing mass identified. Mild mediastinal lymphadenopathy, without significant change since prior study in 2020. 7 mm mildly spiculated pulmonary nodule in left  upper lobe. Malignancy cannot be excluded. Consider PET-CT for further evaluation. Electronically Signed   By:  Norleen DELENA Kil M.D.   On: 09/15/2023 11:46   CT ABDOMEN PELVIS W CONTRAST Result Date: 09/08/2023 CLINICAL DATA:  Diffuse abdominal pain and nausea. History of prostate carcinoma. * Tracking Code: BO * EXAM: CT ABDOMEN AND PELVIS WITH CONTRAST TECHNIQUE: Multidetector CT imaging of the abdomen and pelvis was performed using the standard protocol following bolus administration of intravenous contrast. RADIATION DOSE REDUCTION: This exam was performed according to the departmental dose-optimization program which includes automated exposure control, adjustment of the mA and/or kV according to patient size and/or use of iterative reconstruction technique. CONTRAST:  100mL ISOVUE -300 IOPAMIDOL  (ISOVUE -300) INJECTION 61% COMPARISON:  05/06/2022 FINDINGS: Lower Chest: Small pleural effusion marked diffuse nodular pleural soft tissue thickening is seen, suspicious for a malignant pleural effusion. Mild left basilar atelectasis also noted. Hepatobiliary: No suspicious hepatic masses identified. Gallbladder is unremarkable. No evidence of biliary ductal dilatation. Pancreas:  No mass or inflammatory changes. Spleen: Within normal limits in size and appearance. Adrenals/Urinary Tract: Normal adrenal glands. Mild right renal parenchymal scarring noted. No renal masses identified. No evidence of ureteral calculi or hydronephrosis. Unremarkable unopacified urinary bladder. Stomach/Bowel: No evidence of obstruction, inflammatory process or abnormal fluid collections. Vascular/Lymphatic: No pathologically enlarged lymph nodes. No acute vascular findings. Reproductive:  Small prostate gland noted with brachytherapy seeds. Other:  None. Musculoskeletal:  No suspicious bone lesions identified. IMPRESSION: No acute findings within the abdomen or pelvis. Small left pleural effusion with marked diffuse nodular pleural soft  tissue thickening, suspicious for a malignant pleural effusion. Chest CT with contrast is recommended for further evaluation. These results will be called to the ordering clinician or representative by the Radiologist Assistant, and communication documented in the PACS or [employer]. Electronically Signed   By: Norleen DELENA Kil M.D.   On: 09/08/2023 19:11    Labs:  CBC: Recent Labs    04/09/23 1255 08/09/23 1455 08/30/23 1007 09/16/23 1452  WBC 9.6 8.7 7.5 8.4  HGB 16.2 13.7 13.3 13.6  HCT 50.1 42.5 42.6 42.7  PLT 206 277 245 277    COAGS: No results for input(s): INR, APTT in the last 8760 hours.  BMP: Recent Labs    01/20/23 0927 01/24/23 1000 04/09/23 1255 06/30/23 1416 08/09/23 1455 08/30/23 1007 09/16/23 1452  NA 136   < > 132* 136 135 133* 134*  K 4.8   < > 4.2 4.5 4.2 4.6 4.9  CL 101   < > 96* 100 100 96 97*  CO2 22   < > 25 26 24 20 28   GLUCOSE 131*   < > 140* 125* 137* 106* 129*  BUN 24*   < > 17 13 17 19 19   CALCIUM  10.6*   < > 9.3 9.6 9.3 9.4 9.4  CREATININE 1.60*   < > 1.37* 1.06 1.10 1.16 1.09  GFRNONAA 45*  --  55* >60  --   --  >60   < > = values in this interval not displayed.    LIVER FUNCTION TESTS: Recent Labs    01/20/23 0927 01/24/23 1000 08/09/23 1455 08/30/23 1007 09/16/23 1452  BILITOT 1.5* 1.0 1.0 0.9 0.9  AST 23 18 15 19 17   ALT 25 20 12 10 9   ALKPHOS 62  --   --  143* 121  PROT 7.7 6.9 7.8 7.3 8.1  ALBUMIN  4.2  --   --  3.8 3.8    TUMOR MARKERS: No results for input(s): AFPTM, CEA, CA199, CHROMGRNA in the last 8760 hours.  Assessment and  Plan:  Right Pleural Thickening: KIRSTEN MCKONE is a 73 y.o. male with a history of prostate cancer who had an incidental finding of right pleural thickening on CT A/P for persistent nausea. Further workup concerning for malignancy. Patient referred to IR for lung biopsy. He presents to [facility] today for an image-guided lung biopsy with Dr. DELENA Balder. Procedure  to be performed under moderate sedation.  -NPO since midnight -Eliquis  held for 48hrs -INR 1.2, PT 16.0; reviewed with physician who is okay to proceed -Plan for right sided lung biopsy with possible thoracentesis in CT with Dr. Balder  Risks and benefits of lung biopsy was discussed with the patient and/or patient's family including, but not limited to bleeding, infection, damage to adjacent structures, pneumothorax or low yield requiring additional tests.  All of the questions were answered and there is agreement to proceed.  Consent signed and in chart.   Thank you for this interesting consult. I greatly enjoyed meeting DAVEY LIMAS and look forward to participating in their care. A copy of this report was sent to the requesting provider on this date.  Electronically Signed: Glennon CHRISTELLA Bal, PA-C 10/05/2023, 10:27 AM   I spent a total of 40 Minutes in face to face clinical consultation, greater than 50% of which was counseling/coordinating care for lung biopsy.

## 2023-10-07 ENCOUNTER — Encounter: Payer: Self-pay | Admitting: Medical Oncology

## 2023-10-07 ENCOUNTER — Telehealth: Payer: Self-pay | Admitting: Physician Assistant

## 2023-10-07 ENCOUNTER — Other Ambulatory Visit: Payer: Self-pay | Admitting: Medical Oncology

## 2023-10-07 ENCOUNTER — Telehealth: Payer: Self-pay | Admitting: Internal Medicine

## 2023-10-07 DIAGNOSIS — C3490 Malignant neoplasm of unspecified part of unspecified bronchus or lung: Secondary | ICD-10-CM

## 2023-10-07 LAB — SURGICAL PATHOLOGY

## 2023-10-07 NOTE — Telephone Encounter (Signed)
 I called patient and his wife to review the biopsy results from 10/05/2023. Pleural biopsy confirmed mesothelioma, mixed epithelioid and sarcomatoid variants.   Discussed next steps including consultation with medical oncologist, Dr. Sherrod to further discuss diagnosis and treatment recommendations.   Patient and wife expressed understanding of the plan provided.

## 2023-10-07 NOTE — Progress Notes (Signed)
  [facility]  Diagnostic Nurse Navigator Treatment Team Hand-Off Note  10/07/23  Patient Name:  [name] Patient MRN:  [id number] Patient DOB:  [birth year]   Patient Care Team: [name], MD as PCP - General (Family Medicine) Verlin Lonni BIRCH, MD as PCP - Cardiology (Cardiology) Cindie Ole DASEN, MD as PCP - Electrophysiology (Cardiology) Devere Lonni Righter, MD as Consulting Physician (Urology) Grayce Buddle, RN Nurse Navigator as Registered Nurse (Medical Oncology) Patrcia Cough, MD as Consulting Physician (Radiation Oncology) Lionell Jon DEL, [facility] (Pharmacist) Morgan Clayborne CROME, RN as [facility] Care Management  Chief Complaint Mesothelioma, mixed epithelioid and sarcomatoid variants  Oncology History  Malignant neoplasm of prostate [facility])  08/10/2018 Cancer Staging   Staging form: Prostate, AJCC 8th Edition - Clinical stage from 08/10/2018: Stage IIB (cT1c, cN0, cM0, PSA: 5.6, Grade Group: 2) - Signed by Sherwood Rise, PA-C on 03/08/2019   02/06/2019 Initial Diagnosis   Malignant neoplasm of prostate (HCC)   04/04/2019 -  Radiation Therapy   Prostate Brachytherapy Implant (Dr. Sue Patrcia) - Insertion of radioactive I-125 seeds into the prostate gland. - Radiation Dose: 145 Gy, definitive therapy.      Cancer Staging  Malignant neoplasm of prostate [facility]) Staging form: Prostate, AJCC 8th Edition - Clinical stage from 08/10/2018: Stage IIB (cT1c, cN0, cM0, PSA: 5.6, Grade Group: 2) - Signed by Sherwood Rise, PA-C on 03/08/2019 Histopathologic type: Adenocarcinoma, NOS Stage prefix: Initial diagnosis Prostate specific antigen (PSA) range: Less than 10 Gleason primary pattern: 3 Gleason secondary pattern: 4 Gleason score: 7 Histologic grading system: 5 grade system Number of biopsy cores examined: 12 Number of biopsy cores positive: 1 Location of positive needle core biopsies: One side   SDOH  Screening and Interventions Updated:  No  SDOH Screenings   Food Insecurity: No Food Insecurity (08/25/2023)  Housing: Low Risk  (08/25/2023)  Transportation Needs: No Transportation Needs (08/25/2023)  Utilities: Not At Risk (07/05/2023)  Alcohol Screen: Low Risk  (08/25/2023)  Depression (PHQ2-9): Low Risk  (08/19/2023)  Financial Resource Strain: Low Risk  (08/25/2023)  Physical Activity: Insufficiently Active (08/25/2023)  Social Connections: Socially Integrated (08/25/2023)  Stress: Stress Concern Present (08/25/2023)  Tobacco Use: Medium Risk (10/05/2023)  Health Literacy: Adequate Health Literacy (07/05/2023)     Genetics Assessment Completed:  No Genetics Referral Made:  no  Care Team Updated:  Yes  Colene KYM Raider, RN, BSN, [facility] Oncology Nurse Navigator, [facility] 10/07/2023 11:22 AM

## 2023-10-07 NOTE — Telephone Encounter (Signed)
 Scheduled appointments per 8/29 secure chat. Talked with the patients spouse and she is aware of the made appointments for the patient.

## 2023-10-11 ENCOUNTER — Inpatient Hospital Stay: Attending: Physician Assistant | Admitting: Internal Medicine

## 2023-10-11 ENCOUNTER — Other Ambulatory Visit: Payer: Self-pay | Admitting: Family Medicine

## 2023-10-11 ENCOUNTER — Inpatient Hospital Stay

## 2023-10-11 VITALS — BP 110/73 | HR 100 | Temp 97.0°F | Resp 17 | Ht 67.0 in | Wt 178.0 lb

## 2023-10-11 DIAGNOSIS — Z5112 Encounter for antineoplastic immunotherapy: Secondary | ICD-10-CM | POA: Diagnosis not present

## 2023-10-11 DIAGNOSIS — Z801 Family history of malignant neoplasm of trachea, bronchus and lung: Secondary | ICD-10-CM | POA: Diagnosis not present

## 2023-10-11 DIAGNOSIS — C450 Mesothelioma of pleura: Secondary | ICD-10-CM | POA: Diagnosis not present

## 2023-10-11 DIAGNOSIS — R0781 Pleurodynia: Secondary | ICD-10-CM

## 2023-10-11 DIAGNOSIS — C778 Secondary and unspecified malignant neoplasm of lymph nodes of multiple regions: Secondary | ICD-10-CM | POA: Diagnosis not present

## 2023-10-11 DIAGNOSIS — R634 Abnormal weight loss: Secondary | ICD-10-CM | POA: Diagnosis not present

## 2023-10-11 DIAGNOSIS — Z87891 Personal history of nicotine dependence: Secondary | ICD-10-CM | POA: Diagnosis not present

## 2023-10-11 DIAGNOSIS — Z800 Family history of malignant neoplasm of digestive organs: Secondary | ICD-10-CM | POA: Insufficient documentation

## 2023-10-11 DIAGNOSIS — Z8546 Personal history of malignant neoplasm of prostate: Secondary | ICD-10-CM | POA: Insufficient documentation

## 2023-10-11 DIAGNOSIS — C3490 Malignant neoplasm of unspecified part of unspecified bronchus or lung: Secondary | ICD-10-CM

## 2023-10-11 LAB — CMP (CANCER CENTER ONLY)
ALT: 9 U/L (ref 0–44)
AST: 18 U/L (ref 15–41)
Albumin: 3.3 g/dL — ABNORMAL LOW (ref 3.5–5.0)
Alkaline Phosphatase: 150 U/L — ABNORMAL HIGH (ref 38–126)
Anion gap: 9 (ref 5–15)
BUN: 16 mg/dL (ref 8–23)
CO2: 27 mmol/L (ref 22–32)
Calcium: 9.3 mg/dL (ref 8.9–10.3)
Chloride: 102 mmol/L (ref 98–111)
Creatinine: 0.81 mg/dL (ref 0.61–1.24)
GFR, Estimated: >60 mL/min (ref >60)
Glucose, Bld: 110 mg/dL — ABNORMAL HIGH (ref 70–99)
Potassium: 4.7 mmol/L (ref 3.5–5.1)
Sodium: 138 mmol/L (ref 135–145)
Total Bilirubin: 1.2 mg/dL (ref 0.0–1.2)
Total Protein: 8 g/dL (ref 6.5–8.1)

## 2023-10-11 LAB — CBC WITH DIFFERENTIAL (CANCER CENTER ONLY)
Abs Immature Granulocytes: 0.09 K/uL — ABNORMAL HIGH (ref 0.00–0.07)
Basophils Absolute: 0.1 K/uL (ref 0.0–0.1)
Basophils Relative: 1 %
Eosinophils Absolute: 0.1 K/uL (ref 0.0–0.5)
Eosinophils Relative: 1 %
HCT: 44.4 % (ref 39.0–52.0)
Hemoglobin: 14 g/dL (ref 13.0–17.0)
Immature Granulocytes: 1 %
Lymphocytes Relative: 6 %
Lymphs Abs: 0.6 K/uL — ABNORMAL LOW (ref 0.7–4.0)
MCH: 24.6 pg — ABNORMAL LOW (ref 26.0–34.0)
MCHC: 31.5 g/dL (ref 30.0–36.0)
MCV: 77.9 fL — ABNORMAL LOW (ref 80.0–100.0)
Monocytes Absolute: 1.1 K/uL — ABNORMAL HIGH (ref 0.1–1.0)
Monocytes Relative: 12 %
Neutro Abs: 7.5 K/uL (ref 1.7–7.7)
Neutrophils Relative %: 79 %
Platelet Count: 310 K/uL (ref 150–400)
RBC: 5.7 MIL/uL (ref 4.22–5.81)
RDW: 18.4 % — ABNORMAL HIGH (ref 11.5–15.5)
WBC Count: 9.4 K/uL (ref 4.0–10.5)
nRBC: 0 % (ref 0.0–0.2)

## 2023-10-11 MED ORDER — ONDANSETRON HCL 8 MG PO TABS
8.0000 mg | ORAL_TABLET | Freq: Three times a day (TID) | ORAL | 1 refills | Status: DC | PRN
Start: 2023-10-11 — End: 2023-12-05

## 2023-10-11 MED ORDER — LIDOCAINE-PRILOCAINE 2.5-2.5 % EX CREA: TOPICAL_CREAM | CUTANEOUS | 3 refills | Status: DC

## 2023-10-11 MED ORDER — PROCHLORPERAZINE MALEATE 10 MG PO TABS: 10.0000 mg | ORAL_TABLET | Freq: Four times a day (QID) | ORAL | 1 refills | Status: DC | PRN

## 2023-10-11 NOTE — Progress Notes (Signed)
 [facility] [phone]   [phone]  CONSULT NOTE  REFERRING PHYSICIAN: Dr. Norleen Jobs  REASON FOR CONSULTATION:  73 years old white male recently diagnosed with malignant pleural mesothelioma  HPI [name] is a 73 y.o. male with past medical history significant for multiple medical problems including history of basal cell carcinoma, dyslipidemia, GERD, A-fib, esophageal stricture, GERD, bradycardia, coronary artery disease status post CABG, history of prostate cancer status post seed implants in 2021, bilateral inguinal hernia, stroke, status post pacemaker placement.  The patient was complaining to his primary care provider Avelumab weight loss as well as shortness of breath.  He has CT scan of the chest on 09/13/2023 that showed diffuse nonspecific nodular soft tissue thickening and small amount of fluid throughout the right pleural space and tracking along the fissure is highly suspicious for malignant pleural effusion.  There was also mild mediastinal lymphadenopathy without significant change since prior study in 2020 and 0.7 cm mildly spiculated pulmonary nodule in the left upper lobe.  He was seen at the [facility] and a PET scan was performed on 09/26/2023 and it showed extensive hypermetabolic nodular pleural thickening in the right hemithorax with hypermetabolic ipsilateral mediastinal adenopathy and finding were suspicious for metastatic disease from the prostate cancer but primary bronchogenic carcinoma was not excluded.  The 0.7 cm apical left upper lobe nodule did not show any hypermetabolism.  There was also small associated right pleural effusion.  On 10/05/2023 the patient underwent CT-guided core biopsies of the right pleural thickening by interventional radiology.  The final pathology [id number]) was consistent with mesothelioma, mixed epithelioid and sarcomatoid variants. Immunohistochemical stains were performed to  characterize      the cells. The cells are positive for CK7, calretinin, D2-40, CK5/6, and WT1.  The cells are negative for CK20, TTF-1, CDX-2, GATA3, NKX3.1, and MOC31. PAX8    shows non-specific background staining. The overall findings are in keeping with      a mesothelioma, mixed epithelioid and sarcomatoid variants.  The patient was referred to me today for evaluation and recommendation regarding treatment of his condition.  HPI  Discussed the use of AI scribe software for clinical note transcription with the patient, who gave verbal consent to proceed.  History of Present Illness [name] is a 73 year old male with malignant pleural mesothelioma who presents for evaluation and management of his condition. He is accompanied by his sister [name] and his wife [name].  He began experiencing fatigue and back pain on April 11, 2023. An initial x-ray revealed a small pleural effusion. By May and June, he experienced weight loss and increased fatigue, prompting further investigation.  A CT scan of the abdomen and pelvis on September 06, 2023, showed a small effusion on the right side. A subsequent chest scan on September 13, 2023, revealed right pleural thickening and pleural effusion. A PET scan on September 26, 2023, showed pleural thickening in the right hemothorax and mediastinal lymph nodes. A CT-guided biopsy on October 05, 2023, was performed.  He has experienced a total weight loss of over forty pounds since March 2025. He experiences persistent fatigue, back pain, and right-sided flank pain. He has a mild cough without phlegm or blood, occasional nausea for which he takes Zofran , and uses Vicodin 5 mg for pain management. He also reports occasional blurry vision.  His past medical history includes atrial fibrillation, bradycardia, coronary artery disease with two-vessel bypass surgery, high cholesterol, acid reflux,  and prostate cancer treated with seed implants in 2021. He has a history of stroke  following a change from Coumadin  to Eliquis , which led to a blood clot in his arm and subsequent findings of valve vegetation. He had his thymus gland removed in the 1970s and received radiation therapy, which reportedly damaged his heart valves.  Family history is significant for his mother having colon cancer, his father having lung cancer, and his sister having lung cancer. He is a former smoker, having smoked lightly for about twenty years before quitting in 2000. He drinks alcohol socially and denies any drug abuse. He is allergic to losartan .     Past Medical History:  Diagnosis Date   BCE (basal cell epithelioma), arm    RIGHT SHOULDER   Bradycardia    Bumps on skin    on left side of nose for last 3 weeks   CAD (coronary artery disease)    a. s/p CABGx2 10/11/18 LIMA to LAD, SVG to RCA, EVH via right thigh.   Dyslipidemia    Dysrhythmia    aflutter/afib   Ectatic thoracic aorta (HCC)    Esophageal stricture    GERD (gastroesophageal reflux disease)    Heart murmur    sees dr jerrald   Hyperlipidemia    Incidental pulmonary nodule, > 3mm and < 8mm 10/04/2018   Noted on CTA   Inguinal hernia    bilateral   Obesity    Presence of permanent cardiac pacemaker    Prostate cancer (HCC)    S/P aortic valve replacement with bioprosthetic valve 10/11/2018   23 mm Edwards Inspiris Resilia stented bovine pericardial tissue valve, miltral valve done also   S/P CABG x 2 10/11/2018   LIMA to LAD, SVG to RCA, EVH via right thigh   S/P mitral valve replacement with bioprosthetic valve 10/11/2018   27 mm Medtronic Mosaic stented porcine bioprosthetic tissue valve   S/P patent foramen ovale closure 10/11/2018   S/P placement of cardiac pacemaker 2020   Smoker    Stroke [facility])       Past Surgical History:  Procedure Laterality Date   AORTIC VALVE REPLACEMENT N/A 10/11/2018   Procedure: AORTIC VALVE REPLACEMENT (AVR) with 23 Inspiris Bioprosthetic Aortic valve.;  Surgeon: Dusty Sudie DEL, MD;  Location: [facility] OR;  Service: Open Heart Surgery;  Laterality: N/A;   AV FISTULA PLACEMENT Left 06/15/2022   Procedure: LEFT UPPER EXTREMITY THROMBECTOMY;  Surgeon: Sheree Penne Bruckner, MD;  Location: [facility] OR;  Service: Vascular;  Laterality: Left;   BIOPSY  03/22/2023   Procedure: BIOPSY;  Surgeon: Elicia Claw, MD;  Location: [facility] ENDOSCOPY;  Service: Gastroenterology;;   CLIPPING OF ATRIAL APPENDAGE N/A 10/11/2018   Procedure: CLIPPING OF LEFT ATRIAL APPENDAGE with 45 AtriCure Clip.;  Surgeon: Dusty Sudie DEL, MD;  Location: [facility] OR;  Service: Open Heart Surgery;  Laterality: N/A;   COLONOSCOPY  03/2008,2004   Dr. Dyane   COLONOSCOPY WITH PROPOFOL  N/A 03/22/2023   Procedure: COLONOSCOPY WITH PROPOFOL ;  Surgeon: Elicia Claw, MD;  Location: [facility];  Service: Gastroenterology;  Laterality: N/A;   CORONARY ARTERY BYPASS GRAFT N/A 10/11/2018   Procedure: CORONARY ARTERY BYPASS GRAFTING (CABG) x 2 using LIMA to the LAD and endoscopic greater saphenous vein harvesting to the RCA.;  Surgeon: Dusty Sudie DEL, MD;  Location: [facility] OR;  Service: Open Heart Surgery;  Laterality: N/A;   CYSTOSCOPY  04/04/2019   Procedure: CYSTOSCOPY;  Surgeon: Devere Bruckner Righter, MD;  Location: [facility];  Service: Urology;;   ESOPHAGOGASTRODUODENOSCOPY (EGD) WITH PROPOFOL  N/A 03/22/2023   Procedure: ESOPHAGOGASTRODUODENOSCOPY (EGD) WITH PROPOFOL ;  Surgeon: Elicia Claw, MD;  Location: [facility];  Service: Gastroenterology;  Laterality: N/A;   HERNIA REPAIR  12/11/10   BIH   MITRAL VALVE REPLACEMENT N/A 10/11/2018   Procedure: MITRAL VALVE (MV) REPLACEMENT with 27 Mosaic Bioprosthetic Mitral valve.;  Surgeon: Dusty Sudie DEL, MD;  Location: [facility] OR;  Service: Open Heart Surgery;  Laterality: N/A;   PACEMAKER IMPLANT N/A 10/17/2018   Procedure: PACEMAKER IMPLANT;  Surgeon: Kelsie Agent, MD;  Location: [facility];  Service: Cardiovascular;  Laterality: N/A;    POLYPECTOMY  03/22/2023   Procedure: POLYPECTOMY;  Surgeon: Elicia Claw, MD;  Location: [facility];  Service: Gastroenterology;;   RADIOACTIVE SEED IMPLANT N/A 04/04/2019   Procedure: RADIOACTIVE SEED IMPLANT/BRACHYTHERAPY IMPLANT, cystoscopy;  Surgeon: Devere Lonni Righter, MD;  Location: [facility];  Service: Urology;  Laterality: N/A;  58 seeds   REPAIR OF PATENT FORAMEN OVALE N/A 10/11/2018   Procedure: CLOSURE OF PATENT FORAMEN OVALE;  Surgeon: Dusty Sudie DEL, MD;  Location: [facility] OR;  Service: Open Heart Surgery;  Laterality: N/A;   RIGHT/LEFT HEART CATH AND CORONARY ANGIOGRAPHY N/A 09/15/2018   Procedure: RIGHT/LEFT HEART CATH AND CORONARY ANGIOGRAPHY;  Surgeon: Verlin Lonni BIRCH, MD;  Location: [facility];  Service: Cardiovascular;  Laterality: N/A;   RIGHT/LEFT HEART CATH AND CORONARY/GRAFT ANGIOGRAPHY N/A 05/25/2022   Procedure: RIGHT/LEFT HEART CATH AND CORONARY/GRAFT ANGIOGRAPHY;  Surgeon: Verlin Lonni BIRCH, MD;  Location: [facility];  Service: Cardiovascular;  Laterality: N/A;   TEE WITHOUT CARDIOVERSION N/A 12/12/2015   Procedure: TRANSESOPHAGEAL ECHOCARDIOGRAM (TEE);  Surgeon: Jerel Balding, MD;  Location: [facility] ENDOSCOPY;  Service: Cardiovascular;  Laterality: N/A;   TEE WITHOUT CARDIOVERSION N/A 10/11/2018   Procedure: TRANSESOPHAGEAL ECHOCARDIOGRAM (TEE);  Surgeon: Dusty Sudie DEL, MD;  Location: [facility] OR;  Service: Open Heart Surgery;  Laterality: N/A;   TEE WITHOUT CARDIOVERSION N/A 06/16/2022   Procedure: TRANSESOPHAGEAL ECHOCARDIOGRAM;  Surgeon: Gardenia Led, DO;  Location: [facility];  Service: Cardiovascular;  Laterality: N/A;   THYMECTOMY  1976   radaiation tx done    Family History  Problem Relation Age of Onset   Colon cancer Mother    Lung cancer Father        smoker   CAD Neg Hx    Pancreatic cancer Neg Hx    Breast cancer Neg Hx     Social History Social History   Tobacco Use   Smoking status: Former     Average packs/day: 0.3 packs/day for 40.6 years (14.0 ttl pk-yrs)    Types: Cigarettes    Start date: 02/08/1978    Passive exposure: Never   Smokeless tobacco: Never  Vaping Use   Vaping status: Never Used  Substance Use Topics   Alcohol use: Yes    Alcohol/week: 1.0 standard drink of alcohol    Types: 1 Cans of beer per week    Comment: rare   Drug use: No    Allergies  Allergen Reactions   Losartan  Rash    Current Outpatient Medications  Medication Sig Dispense Refill   acetaminophen  (TYLENOL ) 325 MG tablet Take 2 tablets (650 mg total) by mouth every 4 (four) hours as needed for moderate pain.     amoxicillin  (AMOXIL ) 500 MG capsule Take 1 capsule (500 mg total) by mouth 3 (three) times daily. 90 capsule 5   atorvastatin  (LIPITOR) 20 MG tablet TAKE 1  TABLET BY MOUTH EVERY DAY 90 tablet 3   cetirizine (ZYRTEC) 10 MG tablet Take 10 mg by mouth daily.     digoxin  (LANOXIN ) 0.125 MG tablet TAKE 1 TABLET BY MOUTH EVERY DAY 90 tablet 3   ELIQUIS  5 MG TABS tablet TAKE 1 TABLET BY MOUTH 2 TIMES A DAY 60 tablet 11   empagliflozin  (JARDIANCE ) 25 MG TABS tablet Take 1 tablet (25 mg total) by mouth daily before breakfast. 90 tablet 1   furosemide  (LASIX ) 20 MG tablet Take 1 tablet (20 mg total) by mouth daily. Patient is taking every other day. 90 tablet 0   HYDROcodone -acetaminophen  (NORCO/VICODIN) 5-325 MG tablet Take 1 tablet by mouth every 6 (six) hours as needed for moderate pain (pain score 4-6). 30 tablet 0   metoprolol  succinate (TOPROL -XL) 100 MG 24 hr tablet TAKE 1 TABLET BY MOUTH EVERY DAY (TAKE WITH OR IMMEDIATELY FOLLOWING A MEAL) 90 tablet 3   mirtazapine  (REMERON ) 15 MG tablet Take 1 tablet (15 mg total) by mouth at bedtime. 30 tablet 0   omeprazole  (PRILOSEC  OTC) 20 MG tablet Take 1 tablet (20 mg total) by mouth daily. 90 tablet 3   ondansetron  (ZOFRAN -ODT) 4 MG disintegrating tablet Take 1 tablet (4 mg total) by mouth every 8 (eight) hours as needed for nausea or  vomiting. 30 tablet 1   oxymetazoline (AFRIN) 0.05 % nasal spray Place 1 spray into both nostrils at bedtime as needed for congestion.     polyethylene glycol powder (GLYCOLAX /MIRALAX ) 17 GM/SCOOP powder Take 17 g by mouth daily as needed for mild constipation. 238 g 0   spironolactone  (ALDACTONE ) 25 MG tablet Take 1 tablet (25 mg total) by mouth daily. NEEDS FOLLOW UP APPOINTMENT FOR MORE REFILLS 90 tablet 0   No current facility-administered medications for this visit.    Review of Systems  Constitutional: positive for fatigue and weight loss Eyes: negative Ears, nose, mouth, throat, and face: negative Respiratory: positive for cough, dyspnea on exertion, and pleurisy/chest pain Cardiovascular: negative Gastrointestinal: negative Genitourinary:negative Integument/breast: negative Hematologic/lymphatic: negative Musculoskeletal:positive for back pain Neurological: negative Behavioral/Psych: negative Endocrine: negative Allergic/Immunologic: negative  Physical Exam  MJO:jozmu, healthy, no distress, well nourished, and well developed SKIN: skin color, texture, turgor are normal, no rashes or significant lesions HEAD: Normocephalic, No masses, lesions, tenderness or abnormalities EYES: normal, PERRLA, Conjunctiva are pink and non-injected EARS: External ears normal, Canals clear OROPHARYNX:no exudate, no erythema, and lips, buccal mucosa, and tongue normal  NECK: supple, no adenopathy, no JVD LYMPH:  no palpable lymphadenopathy, no hepatosplenomegaly LUNGS: coarse sounds heard, decreased breath sounds HEART: regular rate & rhythm, no murmurs, and no gallops ABDOMEN:abdomen soft, non-tender, normal bowel sounds, and no masses or organomegaly BACK: Back symmetric, no curvature., No CVA tenderness EXTREMITIES:no joint deformities, effusion, or inflammation, no edema  NEURO: alert & oriented x 3 with fluent speech, no focal motor/sensory deficits  PERFORMANCE STATUS: ECOG  1  LABORATORY DATA: Lab Results  Component Value Date   WBC 7.9 10/05/2023   HGB 13.2 10/05/2023   HCT 43.8 10/05/2023   MCV 80.2 10/05/2023   PLT 268 10/05/2023      Chemistry      Component Value Date/Time   NA 134 (L) 09/16/2023 1452   NA 133 (L) 08/30/2023 1007   K 4.9 09/16/2023 1452   CL 97 (L) 09/16/2023 1452   CO2 28 09/16/2023 1452   BUN 19 09/16/2023 1452   BUN 19 08/30/2023 1007   CREATININE 1.09 09/16/2023 1452  CREATININE 1.10 08/09/2023 1455      Component Value Date/Time   CALCIUM  9.4 09/16/2023 1452   ALKPHOS 121 09/16/2023 1452   AST 17 09/16/2023 1452   ALT 9 09/16/2023 1452   BILITOT 0.9 09/16/2023 1452       RADIOGRAPHIC STUDIES: DG Chest Port 1 View Result Date: 10/06/2023 EXAM: 1 VIEW XRAY OF THE CHEST 10/05/2023 01:10:00 PM COMPARISON: 04/09/2023, recent PET/CT CLINICAL HISTORY: S/P biopsy [id number]. S/p right lung biopsy. FINDINGS: LUNGS AND PLEURA: Low lung volumes. No pneumothorax. Moderate right pleural effusion, partially loculated. Patchy airspace opacities in right lung. Linear subsegmental atelectasis in left lung. HEART AND MEDIASTINUM: Prior median sternotomy with prosthetic cardiac valve. Left-sided pacemaker in place. Aortic atherosclerosis. BONES AND SOFT TISSUES: No acute osseous abnormality. IMPRESSION: 1. No pneumothorax post right lung biopsy. 2. Partially loculated right pleural effusion with right lung opacities. Electronically signed by: Andrea Gasman MD 10/06/2023 09:23 AM EDT RP Workstation: [id number]   CT LUNG MASS BIOPSY Result Date: 10/05/2023 INDICATION: 74 year old with diffuse right pleural thickening and suspicious for a neoplastic or metastatic process. Tissue diagnosis is needed. EXAM: CT-GUIDED BIOPSY OF RIGHT PLEURAL THICKENING TECHNIQUE: Multidetector CT imaging of the chest was performed following the standard protocol without IV contrast. RADIATION DOSE REDUCTION: This exam was performed according to the departmental  dose-optimization program which includes automated exposure control, adjustment of the mA and/or kV according to patient size and/or use of iterative reconstruction technique. MEDICATIONS: Moderate sedation ANESTHESIA/SEDATION: Moderate (conscious) sedation was employed during this procedure. A total of Versed  2 mg and Fentanyl  100 mcg was administered intravenously by the radiology nurse. Total intra-service moderate Sedation Time: 19 minutes. The patient's level of consciousness and vital signs were monitored continuously by radiology nursing throughout the procedure under my direct supervision. COMPLICATIONS: None immediate. PROCEDURE: Informed written consent was obtained from the patient after a thorough discussion of the procedural risks, benefits and alternatives. All questions were addressed. A timeout was performed prior to the initiation of the procedure. Patient was placed prone and CT images of the chest were obtained. Pleural thickening along the posterolateral aspect of the right chest was targeted. Posterior right lower chest was prepped with chlorhexidine  and sterile field was created. Skin was anesthetized using 1% lidocaine . An small incision was made. Using CT guidance, 17 gauge coaxial needle was directed into the posterolateral right pleural thickening. Needle position was confirmed with CT. Total of 3 core 18 gauge biopsies were performed. Two adequate specimens obtained. 17 gauge needle was removed without complication. Follow up CT images were obtained. Bandage placed over the puncture site. FINDINGS: Diffuse right pleural thickening. Small amount of right pleural fluid. Needle position confirmed within the posterolateral right pleural thickening. Two adequate specimens obtained. No pneumothorax on the post biopsy images. IMPRESSION: CT-guided core biopsies of the right pleural thickening. Electronically Signed   By: Juliene Balder M.D.   On: 10/05/2023 12:12   NM PET Image Initial (PI) Skull  Base To Thigh Result Date: 09/26/2023 CLINICAL DATA:  Initial treatment strategy for right pleural thickening. EXAM: NUCLEAR MEDICINE PET SKULL BASE TO THIGH TECHNIQUE: 9.3 mCi F-18 FDG was injected intravenously. Full-ring PET imaging was performed from the skull base to thigh after the radiotracer. CT data was obtained and used for attenuation correction and anatomic localization. Fasting blood glucose: 108 mg/dl COMPARISON:  CT chest 91/94/7974 and CT abdomen pelvis. FINDINGS: Mediastinal blood pool activity: SUV max 2.8 Liver activity: SUV max NA NECK: No abnormal hypermetabolism. Incidental CT findings:  None. CHEST: Extensive hypermetabolic nodular pleural thickening throughout the right hemithorax, SUV max 12.2. 7 mm apical left upper lobe nodule (4/59), SUV max 1.5. Hypermetabolic low right paratracheal lymph node measures 1.4 cm, SUV max 4.0. No additional abnormal hypermetabolism. Incidental CT findings: Atherosclerotic calcification of the aorta and coronary arteries. Aortic valve replacement. Heart is enlarged. No pericardial effusion. Small right pleural effusion. ABDOMEN/PELVIS: No abnormal hypermetabolism. Incidental CT findings: Punctate left renal stone.  Radiotherapy seeds in the prostate. SKELETON: No abnormal hypermetabolism. Incidental CT findings: Degenerative changes in the spine. IMPRESSION: 1. Extensive hypermetabolic nodular pleural thickening in the right hemithorax with hypermetabolic ipsilateral mediastinal adenopathy, findings likely due to metastatic prostate cancer. Primary bronchogenic carcinoma is not excluded. 2. 7 mm apical left upper lobe nodule does not show metabolism above blood pool but is too small for PET resolution. Recommend attention on follow-up as a metastasis is suspected. 3. Small associated right pleural effusion. 4. Punctate left renal stone. 5. Aortic atherosclerosis (ICD10-I70.0). Coronary artery calcification. Electronically Signed   By: Newell Eke M.D.    On: 09/26/2023 16:36   IR US  CHEST Result Date: 09/20/2023 INDICATION: Patient on lasix  with suspected malignant right effusion with request for diagnostic thoracentesis. EXAM: CHEST ULTRASOUND COMPARISON:  09/10/23 CT Chest FINDINGS: Small right-sided pleural effusion. No significant pocket of fluid or percutaneous window to allow safe thoracentesis. Risks outweigh the benefits. IMPRESSION: Small right-sided pleural effusion with no safe window for percutaneous access. Performed by Laymon Coast, NP Electronically Signed   By: Ester Sides M.D.   On: 09/20/2023 13:53   CT CHEST WO CONTRAST Result Date: 09/15/2023 CLINICAL DATA:  Chest pain. Right pleural effusion. Prostate carcinoma. * Tracking Code: BO * EXAM: CT CHEST WITHOUT CONTRAST TECHNIQUE: Multidetector CT imaging of the chest was performed following the standard protocol without IV contrast. RADIATION DOSE REDUCTION: This exam was performed according to the departmental dose-optimization program which includes automated exposure control, adjustment of the mA and/or kV according to patient size and/or use of iterative reconstruction technique. COMPARISON:  10/04/2018 FINDINGS: Cardiovascular: Mild cardiomegaly. Pacemaker leads in right heart. Prior CABG. 4.0 cm ascending thoracic aortic aneurysm, increased in size from 3.7 cm previously. Mediastinum/Nodes: Mild mediastinal lymphadenopathy noted with largest lymph nodes in the right paratracheal region measuring 1.5 cm and subcarinal region measuring 1.7 cm, without significant change since previous study. No definite hilar lymphadenopathy identified on this unenhanced exam. Lungs/Pleura: Diffuse nodular soft tissue thickening is seen throughout right pleural space and tracking along the fissures, with small amount of low-attenuation pleural fluid seen. This is highly suspicious for a malignant pleural effusion. Right lung volume loss and atelectasis noted, however, central tracheobronchial airways  remain patent and no centrally obstructing mass is identified. A 7 mm mildly spiculated nodule is seen in the apical left upper lobe on image 23/8. No other left lung masses or pleural effusion identified. Upper Abdomen:  Unremarkable. Musculoskeletal: No suspicious bone lesions. Chronic appearing compression deformities of several mid and lower thoracic vertebral bodies are noted. IMPRESSION: Diffuse nodular soft tissue thickening and small amount of fluid throughout the right pleural space and tracking along the fissures, highly suspicious for a malignant pleural effusion. Right lung volume loss and atelectasis, however no centrally obstructing mass identified. Mild mediastinal lymphadenopathy, without significant change since prior study in 2020. 7 mm mildly spiculated pulmonary nodule in left upper lobe. Malignancy cannot be excluded. Consider PET-CT for further evaluation. Electronically Signed   By: Norleen DELENA Kil M.D.   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   PLAN: I had a lengthy discussion with the patient and his family today about his current disease stage, prognosis and treatment options.  I personally and independently reviewed the scan images and discussed the result and showed the images to the patient and his family.  He understands that he has incurable condition and all the treatment options will be of palliative nature with goal of prolongation of her survival and control of her symptoms. Assessment and Plan Assessment & Plan Malignant pleural mesothelioma, right, stage 3, with lymph node involvement, complicated by cancer-related pain, fatigue, weight loss, and nausea Malignant pleural mesothelioma with mixed epithelioid and  sarcomatoid features, stage 2 due to lymph node involvement. Symptoms include significant weight loss (40+ pounds), fatigue, right-sided pain, and nausea. Surgery is not an option due to lymph node involvement and comorbidities. Immunotherapy is preferred due to sarcomatoid features, aiming to control the disease and extend survival. Immunotherapy is palliative, not curative, with an average survival of 15-18 months. Side effects include rash, diarrhea, and potential inflammation in various organs. Discussed the option of no treatment, resulting in shorter survival. - Initiate immunotherapy with Opdivo and Yervoy. Administer Yervoy every six weeks and Opdivo every three weeks. - Arrange for port placement for easier administration of treatment. - Prescribe numbing cream for port access. - Refer to palliative care team for pain management. - Schedule a chemo class for education on treatment. - Send prescription for nausea medication to pharmacy. - Start treatment next week, with sessions every three weeks. The patient was advised to call immediately if he has any concerning symptoms in the interval.    The patient voices understanding of current disease status and treatment options and is in agreement with the current care plan.  All questions were answered. The patient knows to call the clinic with any problems, questions or concerns. We can certainly see the patient much sooner if necessary.  Thank you so much for allowing me to participate in the care of KANNEN MOXEY. I will continue to follow up the patient with you and assist in his care.  The total time spent in the appointment was 90 minutes including review of chart and various tests results, discussions about plan of care and coordination of care plan .   Disclaimer: This note was dictated with voice recognition software. Similar sounding words can inadvertently be transcribed and may not be corrected upon review.   Sherrod MARLA Sherrod October 11, 2023, 12:04 PM

## 2023-10-11 NOTE — Progress Notes (Signed)
 START ON PATHWAY REGIMEN - Mesothelioma     A cycle is every 42 days:     Nivolumab      Ipilimumab   **Always confirm dose/schedule in your pharmacy ordering system**  Patient Characteristics: Newly Diagnosed, Preoperative or Nonsurgical Candidate (Clinical Staging), Pleurectomy/Decortication or Other Surgical Procedure Not Planned, First Line Therapeutic Status: Newly Diagnosed, Preoperative or Nonsurgical Candidate (Clinical Staging) AJCC 9 Stage Grouping: IIIA AJCC M Category: cM0 AJCC N Category: cN1 AJCC T Category: cT2 Check here if patient was staged using an edition other than AJCC Staging 9th Edition: false Intent of Therapy: Non-Curative / Palliative Intent, Discussed with Patient

## 2023-10-12 ENCOUNTER — Telehealth: Payer: Self-pay | Admitting: Internal Medicine

## 2023-10-12 ENCOUNTER — Other Ambulatory Visit: Payer: Self-pay

## 2023-10-12 ENCOUNTER — Telehealth: Payer: Self-pay

## 2023-10-12 NOTE — Discharge Instructions (Signed)
 Implanted Port Insertion After Care    What can I expect after the procedure?    After the procedure, it is common to have:    Discomfort at the port insertion site.    Bruising on the skin over the port. This should improve over 3-4 days.      Port care    After your port is placed, you will get a manufacturer's information card. The card has information about your port. Keep this card with you at all times.    Take care of the port as told by your health care provider. Ask your health care provider if you or a family member can get training for taking care of the port at home.    Make sure to remember what type of port you have.         Incision care    Follow instructions from your health care provider about how to take care of your port insertion site. Make sure you:    Wash your hands with soap and water for at least 20 seconds before and after you change your bandage (dressing). If soap and water are not available, use hand sanitizer.          Leave your initial bandage on for a full 24 hours.      After 24 hours you may remove the dressing and shower.  Do not scrub directly on the incision site but rather above it and let the soapy water run over the incision.  Pat dry after.     You may then opt to redress your incision for your comfort but you may just leave it open to air.  The Dermabond (surgical super glue) will protect your incision and keep it clean and dry.     Leave the layer of skin glue in place. If the glue edges start to loosen and curl up, you may trim the loose edges but do not pull or pick at it.    Do NOT apply neosporin or other antibacterial ointment to the surgical glue.  It will dissolve the glue and expose your new incision to possible infection.      Do NOT apply EMLA  numbing cream to the surgical glue.  It will dissolve the glue and expose your new incision to possible infection.  You may have to wait to use the EMLA  cream until your incision  has healed.     Check your port insertion site every day for signs of infection. Check for:    Redness, swelling, or pain.    Fluid or blood.    Warmth.    Pus or a bad smell.       Activity    Return to your normal activities as told by your health care provider. Ask your health care provider what activities are safe for you.    You may have to avoid lifting. Ask your health care provider how much you can safely lift.    General instructions    Take over-the-counter and prescription medicines only as told by your health care provider.    Do not take baths, swim, or use a hot tub until your incision has healed completely (usually 2 weeks).     If you were given a sedative during the procedure, it can affect you for several hours. Do not drive, operate machinery or sign important documents for 24 hours after your procedure.    Keep all follow-up visits. This is important.  Please contact our office at [phone]  and ask to speak with the nurse for the following symptoms:    You have a fever or chills.    You have redness, swelling, or pain around your port insertion site.    You have fluid or blood coming from your port insertion site.    Your port insertion site feels warm to the touch.    You have pus or a bad smell coming from the port insertion site.    Get help right away if:    You have chest pain or shortness of breath.    You have bleeding from your port that you cannot control.    Do not wait to see if the symptoms will go away.    Do not drive yourself to the hospital.        These symptoms may be an emergency.     Get help right away. Call 911.      Summary    Take care of the port as told by your health care provider. Keep the manufacturer's information card with you at all times.    Keep your dressing on for 24 hours!  After that you can shower and redress the site only as needed.     No neosporin, antibiotic ointment or EMLA  numbing  cream on the glue over your incision    Do not submerge your incision under water in a bath, pool or hot tube until fully healed    Contact a health care provider if you have a fever or chills or if you have redness, swelling, or pain around your port insertion site.    Keep all follow-up visits.              If you need to speak to someone after hours please call the on-call IR MD at [phone]. Tell them you are a patient of Dr. Karalee and you had a Port placed today and any issues you are having.   Thank you for visiting [facility] today!

## 2023-10-12 NOTE — Progress Notes (Signed)
 Done

## 2023-10-12 NOTE — Telephone Encounter (Signed)
 Scheduled appointments per WQ. Talked with the patients wife and she is aware of the made appointments for the patient.

## 2023-10-13 ENCOUNTER — Ambulatory Visit
Admission: RE | Admit: 2023-10-13 | Discharge: 2023-10-13 | Disposition: A | Discharge status: Home / Self Care | Source: Ambulatory Visit | Attending: Internal Medicine | Admitting: Internal Medicine

## 2023-10-13 DIAGNOSIS — C450 Mesothelioma of pleura: Secondary | ICD-10-CM | POA: Diagnosis not present

## 2023-10-13 DIAGNOSIS — Z452 Encounter for adjustment and management of vascular access device: Secondary | ICD-10-CM | POA: Diagnosis not present

## 2023-10-13 MED ORDER — FENTANYL CITRATE PF 50 MCG/ML IJ SOSY
25.0000 ug | PREFILLED_SYRINGE | INTRAMUSCULAR | Status: DC | PRN
  Administered 2023-10-13 (×3): 25 ug via INTRAVENOUS

## 2023-10-13 MED ORDER — MIDAZOLAM HCL 2 MG/2ML IJ SOLN
1.0000 mg | INTRAMUSCULAR | Status: DC | PRN
  Administered 2023-10-13: 1 mg via INTRAVENOUS

## 2023-10-13 MED ORDER — LIDOCAINE HCL (PF) 1 % IJ SOLN
20.0000 mL | Freq: Once | INTRAMUSCULAR | Status: AC
  Administered 2023-10-13: 20 mL

## 2023-10-13 MED ORDER — HEPARIN SOD (PORK) LOCK FLUSH 100 UNIT/ML IV SOLN
500.0000 [IU] | Freq: Once | INTRAVENOUS | Status: AC
  Administered 2023-10-13: 500 [IU] via INTRAVENOUS

## 2023-10-13 NOTE — H&P (Addendum)
 Chief Complaint: Patient was seen in consultation today for mesothelioma  Referring Physician(s): Mohamed,Mohamed  Supervising Physician: Jenna Hacker  Patient Status: [facility] [name]  History of Present Illness: [name] is a 73 y.o. male  with past medical history significant for multiple medical problems including history of basal cell carcinoma, dyslipidemia, GERD, A-fib, esophageal stricture, GERD, bradycardia, coronary artery disease status post CABG, history of prostate cancer status post seed implants in 2021, bilateral inguinal hernia, stroke, status post pacemaker placement, recently known to IR from pleural biopsy which revealed mesothelioma of the right hemithorax. He now presents to [facility] for Port-A-Cath placement.   Patient presents today in his usual state of health.  He has been NPO. He is accompanied by his wife today who is available for post-procedure care and transportation.  He is understanding of the goals of the procedure and is agreeable to proceed.   Patient is a FULL CODE for procedural purposes today.  Past Medical History:  Diagnosis Date   BCE (basal cell epithelioma), arm    RIGHT SHOULDER   Bradycardia    Bumps on skin    on left side of nose for last 3 weeks   CAD (coronary artery disease)    a. s/p CABGx2 10/11/18 LIMA to LAD, SVG to RCA, EVH via right thigh.   Dyslipidemia    Dysrhythmia    aflutter/afib   Ectatic thoracic aorta (HCC)    Esophageal stricture    GERD (gastroesophageal reflux disease)    Heart murmur    sees dr jerrald   Hyperlipidemia    Incidental pulmonary nodule, > 3mm and < 8mm 10/04/2018   Noted on CTA   Inguinal hernia    bilateral   Obesity    Presence of permanent cardiac pacemaker    Prostate cancer (HCC)    S/P aortic valve replacement with bioprosthetic valve 10/11/2018   23 mm Edwards Inspiris Resilia stented bovine pericardial tissue valve, miltral valve done also   S/P CABG x 2 10/11/2018    LIMA to LAD, SVG to RCA, EVH via right thigh   S/P mitral valve replacement with bioprosthetic valve 10/11/2018   27 mm Medtronic Mosaic stented porcine bioprosthetic tissue valve   S/P patent foramen ovale closure 10/11/2018   S/P placement of cardiac pacemaker 2020   Smoker    Stroke [facility])     Past Surgical History:  Procedure Laterality Date   AORTIC VALVE REPLACEMENT N/A 10/11/2018   Procedure: AORTIC VALVE REPLACEMENT (AVR) with 23 Inspiris Bioprosthetic Aortic valve.;  Surgeon: Dusty Sudie DEL, MD;  Location: [facility] OR;  Service: Open Heart Surgery;  Laterality: N/A;   AV FISTULA PLACEMENT Left 06/15/2022   Procedure: LEFT UPPER EXTREMITY THROMBECTOMY;  Surgeon: Sheree Penne Bruckner, MD;  Location: [facility] OR;  Service: Vascular;  Laterality: Left;   BIOPSY  03/22/2023   Procedure: BIOPSY;  Surgeon: Elicia Claw, MD;  Location: [facility] ENDOSCOPY;  Service: Gastroenterology;;   CLIPPING OF ATRIAL APPENDAGE N/A 10/11/2018   Procedure: CLIPPING OF LEFT ATRIAL APPENDAGE with 45 AtriCure Clip.;  Surgeon: Dusty Sudie DEL, MD;  Location: [facility] OR;  Service: Open Heart Surgery;  Laterality: N/A;   COLONOSCOPY  03/2008,2004   Dr. Dyane   COLONOSCOPY WITH PROPOFOL  N/A 03/22/2023   Procedure: COLONOSCOPY WITH PROPOFOL ;  Surgeon: Elicia Claw, MD;  Location: [facility] ENDOSCOPY;  Service: Gastroenterology;  Laterality: N/A;   CORONARY ARTERY BYPASS GRAFT N/A 10/11/2018   Procedure: CORONARY ARTERY BYPASS GRAFTING (CABG) x 2 using LIMA  to the LAD and endoscopic greater saphenous vein harvesting to the RCA.;  Surgeon: Dusty Sudie DEL, MD;  Location: [facility] OR;  Service: Open Heart Surgery;  Laterality: N/A;   CYSTOSCOPY  04/04/2019   Procedure: CYSTOSCOPY;  Surgeon: Devere Lonni Righter, MD;  Location: [facility];  Service: Urology;;   ESOPHAGOGASTRODUODENOSCOPY (EGD) WITH PROPOFOL  N/A 03/22/2023   Procedure: ESOPHAGOGASTRODUODENOSCOPY (EGD) WITH PROPOFOL ;  Surgeon: Elicia Claw, MD;   Location: [facility] ENDOSCOPY;  Service: Gastroenterology;  Laterality: N/A;   HERNIA REPAIR  12/11/10   BIH   MITRAL VALVE REPLACEMENT N/A 10/11/2018   Procedure: MITRAL VALVE (MV) REPLACEMENT with 27 Mosaic Bioprosthetic Mitral valve.;  Surgeon: Dusty Sudie DEL, MD;  Location: [facility] OR;  Service: Open Heart Surgery;  Laterality: N/A;   PACEMAKER IMPLANT N/A 10/17/2018   Procedure: PACEMAKER IMPLANT;  Surgeon: Kelsie Agent, MD;  [facility]: [facility] INVASIVE CV LAB;  Service: Cardiovascular;  Laterality: N/A;   POLYPECTOMY  03/22/2023   Procedure: POLYPECTOMY;  Surgeon: Elicia Claw, MD;  Location: [facility] ENDOSCOPY;  Service: Gastroenterology;;   RADIOACTIVE SEED IMPLANT N/A 04/04/2019   Procedure: RADIOACTIVE SEED IMPLANT/BRACHYTHERAPY IMPLANT, cystoscopy;  Surgeon: Devere Lonni Righter, MD;  Location: [facility];  Service: Urology;  Laterality: N/A;  58 seeds   REPAIR OF PATENT FORAMEN OVALE N/A 10/11/2018   Procedure: CLOSURE OF PATENT FORAMEN OVALE;  Surgeon: Dusty Sudie DEL, MD;  Location: [facility] OR;  Service: Open Heart Surgery;  Laterality: N/A;   RIGHT/LEFT HEART CATH AND CORONARY ANGIOGRAPHY N/A 09/15/2018   Procedure: RIGHT/LEFT HEART CATH AND CORONARY ANGIOGRAPHY;  Surgeon: Verlin Lonni BIRCH, MD;  Location: [facility] INVASIVE CV LAB;  Service: Cardiovascular;  Laterality: N/A;   RIGHT/LEFT HEART CATH AND CORONARY/GRAFT ANGIOGRAPHY N/A 05/25/2022   Procedure: RIGHT/LEFT HEART CATH AND CORONARY/GRAFT ANGIOGRAPHY;  Surgeon: Verlin Lonni BIRCH, MD;  Location: [facility] INVASIVE CV LAB;  Service: Cardiovascular;  Laterality: N/A;   TEE WITHOUT CARDIOVERSION N/A 12/12/2015   Procedure: TRANSESOPHAGEAL ECHOCARDIOGRAM (TEE);  Surgeon: Jerel Balding, MD;  Location: [facility] ENDOSCOPY;  Service: Cardiovascular;  Laterality: N/A;   TEE WITHOUT CARDIOVERSION N/A 10/11/2018   Procedure: TRANSESOPHAGEAL ECHOCARDIOGRAM (TEE);  Surgeon: Dusty Sudie DEL, MD;  Location: [facility] OR;  Service: Open Heart Surgery;  Laterality:  N/A;   TEE WITHOUT CARDIOVERSION N/A 06/16/2022   Procedure: TRANSESOPHAGEAL ECHOCARDIOGRAM;  Surgeon: Gardenia Led, DO;  Location: [facility] INVASIVE CV LAB;  Service: Cardiovascular;  Laterality: N/A;   THYMECTOMY  1976   radaiation tx done    Allergies: Losartan   Medications: Prior to Admission medications   Medication Sig Start Date End Date Taking? Authorizing Provider  acetaminophen  (TYLENOL ) 325 MG tablet Take 2 tablets (650 mg total) by mouth every 4 (four) hours as needed for moderate pain. 06/22/22   Patsy Lenis, MD  amoxicillin  (AMOXIL ) 500 MG capsule Take 1 capsule (500 mg total) by mouth 3 (three) times daily. 08/09/23   Dennise Kingsley, MD  atorvastatin  (LIPITOR) 20 MG tablet TAKE 1 TABLET BY MOUTH EVERY DAY 07/21/23   Lalonde, John C, MD  cetirizine (ZYRTEC) 10 MG tablet Take 10 mg by mouth daily.    [provider]  digoxin  (LANOXIN ) 0.125 MG tablet TAKE 1 TABLET BY MOUTH EVERY DAY 03/02/23   Sabharwal, Aditya, DO  ELIQUIS  5 MG TABS tablet TAKE 1 TABLET BY MOUTH 2 TIMES A DAY 08/08/23   Sabharwal, Aditya, DO  empagliflozin  (JARDIANCE ) 25 MG TABS tablet Take 1 tablet (25 mg total) by mouth daily before breakfast. 05/25/23   Joyce,  Norleen BROCKS, MD  furosemide  (LASIX ) 20 MG tablet Take 1 tablet (20 mg total) by mouth daily. Patient is taking every other day. 07/22/23   Sabharwal, Aditya, DO  HYDROcodone -acetaminophen  (NORCO/VICODIN) 5-325 MG tablet TAKE 1 TABLET BY MOUTH EVERY 6 HOURS AS NEEDED FOR MODERATE PAIN 10/11/23   [name], MD  lidocaine -prilocaine  (EMLA ) cream Apply to affected area once 10/11/23   Sherrod Sherrod, MD  metoprolol  succinate (TOPROL -XL) 100 MG 24 hr tablet TAKE 1 TABLET BY MOUTH EVERY DAY (TAKE WITH OR IMMEDIATELY FOLLOWING A MEAL) 08/11/23   Sabharwal, Aditya, DO  mirtazapine  (REMERON ) 15 MG tablet Take 1 tablet (15 mg total) by mouth at bedtime. 09/28/23   Neomi Johnston DASEN, PA-C  omeprazole  (PRILOSEC  OTC) 20 MG tablet Take 1 tablet (20 mg total) by mouth  daily. 03/22/23 03/21/24  Elicia Claw, MD  ondansetron  (ZOFRAN ) 8 MG tablet Take 1 tablet (8 mg total) by mouth every 8 (eight) hours as needed for nausea or vomiting. 10/11/23   Sherrod Sherrod, MD  ondansetron  (ZOFRAN -ODT) 4 MG disintegrating tablet Take 1 tablet (4 mg total) by mouth every 8 (eight) hours as needed for nausea or vomiting. 09/15/23   Lalonde, John C, MD  oxymetazoline (AFRIN) 0.05 % nasal spray Place 1 spray into both nostrils at bedtime as needed for congestion.    [provider]  polyethylene glycol powder (GLYCOLAX /MIRALAX ) 17 GM/SCOOP powder Take 17 g by mouth daily as needed for mild constipation. 05/26/22   Akula, Vijaya, MD  prochlorperazine  (COMPAZINE ) 10 MG tablet Take 1 tablet (10 mg total) by mouth every 6 (six) hours as needed for nausea or vomiting. 10/11/23   Sherrod Sherrod, MD  spironolactone  (ALDACTONE ) 25 MG tablet Take 1 tablet (25 mg total) by mouth daily. NEEDS FOLLOW UP APPOINTMENT FOR MORE REFILLS 05/24/23   Gardenia Led, DO     Family History  Problem Relation Age of Onset   Colon cancer Mother    Lung cancer Father        smoker   CAD Neg Hx    Pancreatic cancer Neg Hx    Breast cancer Neg Hx     Social History   Socioeconomic History   Marital status: Married    Spouse name: Clarita   Number of children: 0   Years of education: Not on file   Highest education level: Bachelor's degree (e.g., BA, AB, BS)  Occupational History   Occupation: Market researcher: [employer]  Tobacco Use   Smoking status: Former    Average packs/day: 0.3 packs/day for 40.6 years (14.0 ttl pk-yrs)    Types: Cigarettes    Start date: 02/08/1978    Passive exposure: Never   Smokeless tobacco: Never  Vaping Use   Vaping status: Never Used  Substance and Sexual Activity   Alcohol use: Yes    Alcohol/week: 1.0 standard drink of alcohol    Types: 1 Cans of beer per week    Comment: rare   Drug use: No   Sexual activity: Yes  Other Topics  Concern   Not on file  Social History Narrative   Lives with wife at home    Social Drivers of Health   Financial Resource Strain: Low Risk  (08/25/2023)   Overall Financial Resource Strain (CARDIA)    Difficulty of Paying Living Expenses: Not hard at all  Food Insecurity: Unknown (10/12/2023)   Hunger Vital Sign    Worried About Running Out of Food in the Last Year: Never  true    Ran Out of Food in the Last Year: Not on file  Transportation Needs: No Transportation Needs (10/12/2023)   PRAPARE - Administrator, Civil Service (Medical): No    Lack of Transportation (Non-Medical): No  Physical Activity: Insufficiently Active (08/25/2023)   Exercise Vital Sign    Days of Exercise per Week: 1 day    Minutes of Exercise per Session: 10 min  Stress: Stress Concern Present (08/25/2023)   [employer] of Occupational Health - Occupational Stress Questionnaire    Feeling of Stress: To some extent  Social Connections: Socially Integrated (08/25/2023)   Social Connection and Isolation Panel    Frequency of Communication with Friends and Family: More than three times a week    Frequency of Social Gatherings with Friends and Family: Once a week    Attends Religious Services: More than 4 times per year    Active Member of [employer] or Organizations: Yes    Attends Engineer, structural: More than 4 times per year    Marital Status: Married     Review of Systems: A 12 point ROS discussed and pertinent positives are indicated in the HPI above.  All other systems are negative.  Review of Systems  Constitutional:  Negative for fatigue and fever.  Respiratory:  Negative for cough and shortness of breath.   Cardiovascular:  Negative for chest pain.  Gastrointestinal:  Negative for abdominal pain, nausea and vomiting.  Musculoskeletal:  Negative for back pain.  Psychiatric/Behavioral:  Negative for behavioral problems and confusion.     Vital Signs: BP 96/60 (BP Location: Left  Arm, Patient Position: Supine) Comment (Patient Position): with head elevated  Pulse 97   Temp 97.6 F (36.4 C) (Oral)   Resp 16   SpO2 94%   Physical Exam Vitals and nursing note reviewed.  Constitutional:      General: He is not in acute distress.    Appearance: Normal appearance. He is not ill-appearing.  HENT:     Mouth/Throat:     Mouth: Mucous membranes are moist.     Pharynx: Oropharynx is clear.  Cardiovascular:     Rate and Rhythm: Normal rate and regular rhythm.  Pulmonary:     Effort: Pulmonary effort is normal.     Breath sounds: Normal breath sounds.  Abdominal:     General: Abdomen is flat.     Palpations: Abdomen is soft.  Skin:    General: Skin is warm and dry.  Neurological:     General: No focal deficit present.     Mental Status: He is alert and oriented to person, place, and time. Mental status is at baseline.  Psychiatric:        Mood and Affect: Mood normal.        Behavior: Behavior normal.        Thought Content: Thought content normal.        Judgment: Judgment normal.      MD Evaluation Airway: WNL Heart: WNL Abdomen: WNL Chest/ Lungs: WNL ASA  Classification: 3 Mallampati/Airway Score: Two   Imaging: DG Chest Port 1 View Result Date: 10/06/2023 EXAM: 1 VIEW XRAY OF THE CHEST 10/05/2023 01:10:00 PM COMPARISON: 04/09/2023, recent PET/CT CLINICAL HISTORY: S/P biopsy [id number]. S/p right lung biopsy. FINDINGS: LUNGS AND PLEURA: Low lung volumes. No pneumothorax. Moderate right pleural effusion, partially loculated. Patchy airspace opacities in right lung. Linear subsegmental atelectasis in left lung. HEART AND MEDIASTINUM: Prior median sternotomy with prosthetic cardiac  valve. Left-sided pacemaker in place. Aortic atherosclerosis. BONES AND SOFT TISSUES: No acute osseous abnormality. IMPRESSION: 1. No pneumothorax post right lung biopsy. 2. Partially loculated right pleural effusion with right lung opacities. Electronically signed by: Andrea Gasman MD 10/06/2023 09:23 AM EDT RP Workstation: [id number]   CT LUNG MASS BIOPSY Result Date: 10/05/2023 INDICATION: 73 year old with diffuse right pleural thickening and suspicious for a neoplastic or metastatic process. Tissue diagnosis is needed. EXAM: CT-GUIDED BIOPSY OF RIGHT PLEURAL THICKENING TECHNIQUE: Multidetector CT imaging of the chest was performed following the standard protocol without IV contrast. RADIATION DOSE REDUCTION: This exam was performed according to the departmental dose-optimization program which includes automated exposure control, adjustment of the mA and/or kV according to patient size and/or use of iterative reconstruction technique. MEDICATIONS: Moderate sedation ANESTHESIA/SEDATION: Moderate (conscious) sedation was employed during this procedure. A total of Versed  2 mg and Fentanyl  100 mcg was administered intravenously by the radiology nurse. Total intra-service moderate Sedation Time: 19 minutes. The patient's level of consciousness and vital signs were monitored continuously by radiology nursing throughout the procedure under my direct supervision. COMPLICATIONS: None immediate. PROCEDURE: Informed written consent was obtained from the patient after a thorough discussion of the procedural risks, benefits and alternatives. All questions were addressed. A timeout was performed prior to the initiation of the procedure. Patient was placed prone and CT images of the chest were obtained. Pleural thickening along the posterolateral aspect of the right chest was targeted. Posterior right lower chest was prepped with chlorhexidine  and sterile field was created. Skin was anesthetized using 1% lidocaine . An small incision was made. Using CT guidance, 17 gauge coaxial needle was directed into the posterolateral right pleural thickening. Needle position was confirmed with CT. Total of 3 core 18 gauge biopsies were performed. Two adequate specimens obtained. 17 gauge needle was removed  without complication. Follow up CT images were obtained. Bandage placed over the puncture site. FINDINGS: Diffuse right pleural thickening. Small amount of right pleural fluid. Needle position confirmed within the posterolateral right pleural thickening. Two adequate specimens obtained. No pneumothorax on the post biopsy images. IMPRESSION: CT-guided core biopsies of the right pleural thickening. Electronically Signed   By: Juliene Balder M.D.   On: 10/05/2023 12:12   NM PET Image Initial (PI) Skull Base To Thigh Result Date: 09/26/2023 CLINICAL DATA:  Initial treatment strategy for right pleural thickening. EXAM: NUCLEAR MEDICINE PET SKULL BASE TO THIGH TECHNIQUE: 9.3 mCi F-18 FDG was injected intravenously. Full-ring PET imaging was performed from the skull base to thigh after the radiotracer. CT data was obtained and used for attenuation correction and anatomic localization. Fasting blood glucose: 108 mg/dl COMPARISON:  CT chest 91/94/7974 and CT abdomen pelvis. FINDINGS: Mediastinal blood pool activity: SUV max 2.8 Liver activity: SUV max NA NECK: No abnormal hypermetabolism. Incidental CT findings: None. CHEST: Extensive hypermetabolic nodular pleural thickening throughout the right hemithorax, SUV max 12.2. 7 mm apical left upper lobe nodule (4/59), SUV max 1.5. Hypermetabolic low right paratracheal lymph node measures 1.4 cm, SUV max 4.0. No additional abnormal hypermetabolism. Incidental CT findings: Atherosclerotic calcification of the aorta and coronary arteries. Aortic valve replacement. Heart is enlarged. No pericardial effusion. Small right pleural effusion. ABDOMEN/PELVIS: No abnormal hypermetabolism. Incidental CT findings: Punctate left renal stone.  Radiotherapy seeds in the prostate. SKELETON: No abnormal hypermetabolism. Incidental CT findings: Degenerative changes in the spine. IMPRESSION: 1. Extensive hypermetabolic nodular pleural thickening in the right hemithorax with hypermetabolic  ipsilateral mediastinal adenopathy, findings likely due to metastatic prostate  cancer. Primary bronchogenic carcinoma is not excluded. 2. 7 mm apical left upper lobe nodule does not show metabolism above blood pool but is too small for PET resolution. Recommend attention on follow-up as a metastasis is suspected. 3. Small associated right pleural effusion. 4. Punctate left renal stone. 5. Aortic atherosclerosis (ICD10-I70.0). Coronary artery calcification. Electronically Signed   By: Newell Eke M.D.   On: 09/26/2023 16:36   IR US  CHEST Result Date: 09/20/2023 INDICATION: Patient on lasix  with suspected malignant right effusion with request for diagnostic thoracentesis. EXAM: CHEST ULTRASOUND COMPARISON:  09/10/23 CT Chest FINDINGS: Small right-sided pleural effusion. No significant pocket of fluid or percutaneous window to allow safe thoracentesis. Risks outweigh the benefits. IMPRESSION: Small right-sided pleural effusion with no safe window for percutaneous access. Performed by Laymon Coast, NP Electronically Signed   By: Ester Sides M.D.   On: 09/20/2023 13:53   CT CHEST WO CONTRAST Result Date: 09/15/2023 CLINICAL DATA:  Chest pain. Right pleural effusion. Prostate carcinoma. * Tracking Code: BO * EXAM: CT CHEST WITHOUT CONTRAST TECHNIQUE: Multidetector CT imaging of the chest was performed following the standard protocol without IV contrast. RADIATION DOSE REDUCTION: This exam was performed according to the departmental dose-optimization program which includes automated exposure control, adjustment of the mA and/or kV according to patient size and/or use of iterative reconstruction technique. COMPARISON:  10/04/2018 FINDINGS: Cardiovascular: Mild cardiomegaly. Pacemaker leads in right heart. Prior CABG. 4.0 cm ascending thoracic aortic aneurysm, increased in size from 3.7 cm previously. Mediastinum/Nodes: Mild mediastinal lymphadenopathy noted with largest lymph nodes in the right paratracheal  region measuring 1.5 cm and subcarinal region measuring 1.7 cm, without significant change since previous study. No definite hilar lymphadenopathy identified on this unenhanced exam. Lungs/Pleura: Diffuse nodular soft tissue thickening is seen throughout right pleural space and tracking along the fissures, with small amount of low-attenuation pleural fluid seen. This is highly suspicious for a malignant pleural effusion. Right lung volume loss and atelectasis noted, however, central tracheobronchial airways remain patent and no centrally obstructing mass is identified. A 7 mm mildly spiculated nodule is seen in the apical left upper lobe on image 23/8. No other left lung masses or pleural effusion identified. Upper Abdomen:  Unremarkable. Musculoskeletal: No suspicious bone lesions. Chronic appearing compression deformities of several mid and lower thoracic vertebral bodies are noted. IMPRESSION: Diffuse nodular soft tissue thickening and small amount of fluid throughout the right pleural space and tracking along the fissures, highly suspicious for a malignant pleural effusion. Right lung volume loss and atelectasis, however no centrally obstructing mass identified. Mild mediastinal lymphadenopathy, without significant change since prior study in 2020. 7 mm mildly spiculated pulmonary nodule in left upper lobe. Malignancy cannot be excluded. Consider PET-CT for further evaluation. Electronically Signed   By: Norleen DELENA Kil M.D.   On: 09/15/2023 11:46    Labs:  CBC: Recent Labs    08/30/23 1007 09/16/23 1452 10/05/23 1009 10/11/23 1145  WBC 7.5 8.4 7.9 9.4  HGB 13.3 13.6 13.2 14.0  HCT 42.6 42.7 43.8 44.4  PLT 245 277 268 310    COAGS: Recent Labs    10/05/23 1009  INR 1.2    BMP: Recent Labs    04/09/23 1255 06/30/23 1416 08/09/23 1455 08/30/23 1007 09/16/23 1452 10/11/23 1145  NA 132* 136 135 133* 134* 138  K 4.2 4.5 4.2 4.6 4.9 4.7  CL 96* 100 100 96 97* 102  CO2 25 26 24 20 28  27   GLUCOSE 140* 125* 137*  106* 129* 110*  BUN 17 13 17 19 19 16   CALCIUM  9.3 9.6 9.3 9.4 9.4 9.3  CREATININE 1.37* 1.06 1.10 1.16 1.09 0.81  GFRNONAA 55* >60  --   --  >60 >60    LIVER FUNCTION TESTS: Recent Labs    01/20/23 0927 01/24/23 1000 08/09/23 1455 08/30/23 1007 09/16/23 1452 10/11/23 1145  BILITOT 1.5*   < > 1.0 0.9 0.9 1.2  AST 23   < > 15 19 17 18   ALT 25   < > 12 10 9 9   ALKPHOS 62  --   --  143* 121 150*  PROT 7.7   < > 7.8 7.3 8.1 8.0  ALBUMIN  4.2  --   --  3.8 3.8 3.3*   < > = values in this interval not displayed.    TUMOR MARKERS: No results for input(s): AFPTM, CEA, CA199, CHROMGRNA in the last 8760 hours.  Assessment and Plan: Patient with past medical history of left pacemaker placement presents with complaint of right hemithorax mesothelioma with plans for upcoming immunotherapy.  IR consulted for Port-A-Cath placement at the request of Dr. Sherrod. Case reviewed by Dr. Jenna who approves patient for procedure.  Patient presents today in their usual state of health.  He has been NPO and is not currently on blood thinners.   Risks and benefits of image guided port-a-catheter placement was discussed with the patient including, but not limited to bleeding, infection, pneumothorax, or fibrin sheath development and need for additional procedures.  All of the patient's questions were answered, patient is agreeable to proceed. Consent signed and in chart.   Thank you for this interesting consult.  I greatly enjoyed meeting KRISHANG READING and look forward to participating in their care.  A copy of this report was sent to the requesting provider on this date.  Electronically Signed: Nedda Gains Sue-Ellen Jayvion Stefanski, PA 10/13/2023, 10:32 AM   I spent a total of  30 Minutes   in face to face in clinical consultation, greater than 50% of which was counseling/coordinating care for mesothelioma.

## 2023-10-13 NOTE — Progress Notes (Signed)
 Pt back in nursing recovery area. Pt alert, follows commands, talks in complete sentences and has no complaints at this time. Pt will remain in nurses station until discharged by Radiologist.

## 2023-10-17 ENCOUNTER — Encounter: Payer: Self-pay | Admitting: Internal Medicine

## 2023-10-17 ENCOUNTER — Inpatient Hospital Stay

## 2023-10-17 DIAGNOSIS — Z87891 Personal history of nicotine dependence: Secondary | ICD-10-CM | POA: Diagnosis not present

## 2023-10-17 DIAGNOSIS — Z801 Family history of malignant neoplasm of trachea, bronchus and lung: Secondary | ICD-10-CM | POA: Diagnosis not present

## 2023-10-17 DIAGNOSIS — Z5112 Encounter for antineoplastic immunotherapy: Secondary | ICD-10-CM | POA: Diagnosis not present

## 2023-10-17 DIAGNOSIS — Z800 Family history of malignant neoplasm of digestive organs: Secondary | ICD-10-CM | POA: Diagnosis not present

## 2023-10-17 DIAGNOSIS — C778 Secondary and unspecified malignant neoplasm of lymph nodes of multiple regions: Secondary | ICD-10-CM | POA: Diagnosis not present

## 2023-10-17 DIAGNOSIS — C450 Mesothelioma of pleura: Secondary | ICD-10-CM

## 2023-10-17 DIAGNOSIS — R634 Abnormal weight loss: Secondary | ICD-10-CM | POA: Diagnosis not present

## 2023-10-17 DIAGNOSIS — Z8546 Personal history of malignant neoplasm of prostate: Secondary | ICD-10-CM | POA: Diagnosis not present

## 2023-10-17 LAB — CMP (CANCER CENTER ONLY)
ALT: 7 U/L (ref 0–44)
AST: 14 U/L — ABNORMAL LOW (ref 15–41)
Albumin: 3.2 g/dL — ABNORMAL LOW (ref 3.5–5.0)
Alkaline Phosphatase: 125 U/L (ref 38–126)
Anion gap: 8 (ref 5–15)
BUN: 17 mg/dL (ref 8–23)
CO2: 27 mmol/L (ref 22–32)
Calcium: 9 mg/dL (ref 8.9–10.3)
Chloride: 104 mmol/L (ref 98–111)
Creatinine: 0.71 mg/dL (ref 0.61–1.24)
GFR, Estimated: >60 mL/min (ref >60)
Glucose, Bld: 116 mg/dL — ABNORMAL HIGH (ref 70–99)
Potassium: 3.9 mmol/L (ref 3.5–5.1)
Sodium: 139 mmol/L (ref 135–145)
Total Bilirubin: 1.1 mg/dL (ref 0.0–1.2)
Total Protein: 7.4 g/dL (ref 6.5–8.1)

## 2023-10-17 LAB — CBC WITH DIFFERENTIAL (CANCER CENTER ONLY)
Abs Immature Granulocytes: 0.06 K/uL (ref 0.00–0.07)
Basophils Absolute: 0.1 K/uL (ref 0.0–0.1)
Basophils Relative: 1 %
Eosinophils Absolute: 0.1 K/uL (ref 0.0–0.5)
Eosinophils Relative: 2 %
HCT: 41.4 % (ref 39.0–52.0)
Hemoglobin: 13 g/dL (ref 13.0–17.0)
Immature Granulocytes: 1 %
Lymphocytes Relative: 6 %
Lymphs Abs: 0.5 K/uL — ABNORMAL LOW (ref 0.7–4.0)
MCH: 24.1 pg — ABNORMAL LOW (ref 26.0–34.0)
MCHC: 31.4 g/dL (ref 30.0–36.0)
MCV: 76.8 fL — ABNORMAL LOW (ref 80.0–100.0)
Monocytes Absolute: 1 K/uL (ref 0.1–1.0)
Monocytes Relative: 12 %
Neutro Abs: 6.6 K/uL (ref 1.7–7.7)
Neutrophils Relative %: 78 %
Platelet Count: 267 K/uL (ref 150–400)
RBC: 5.39 MIL/uL (ref 4.22–5.81)
RDW: 18.6 % — ABNORMAL HIGH (ref 11.5–15.5)
WBC Count: 8.5 K/uL (ref 4.0–10.5)
nRBC: 0 % (ref 0.0–0.2)

## 2023-10-17 LAB — TSH: TSH: 4.2 u[IU]/mL (ref 0.350–4.500)

## 2023-10-18 LAB — T4: T4, Total: 5.8 ug/dL (ref 4.5–12.0)

## 2023-10-20 ENCOUNTER — Telehealth: Payer: Self-pay | Admitting: Medical Oncology

## 2023-10-20 ENCOUNTER — Encounter: Payer: Self-pay | Admitting: Internal Medicine

## 2023-10-20 ENCOUNTER — Other Ambulatory Visit: Payer: Self-pay

## 2023-10-20 ENCOUNTER — Inpatient Hospital Stay

## 2023-10-20 VITALS — BP 101/70 | HR 106 | Temp 97.8°F | Resp 24 | Wt 175.5 lb

## 2023-10-20 DIAGNOSIS — Z5112 Encounter for antineoplastic immunotherapy: Secondary | ICD-10-CM | POA: Diagnosis not present

## 2023-10-20 DIAGNOSIS — R0902 Hypoxemia: Secondary | ICD-10-CM

## 2023-10-20 DIAGNOSIS — C450 Mesothelioma of pleura: Secondary | ICD-10-CM

## 2023-10-20 DIAGNOSIS — Z8546 Personal history of malignant neoplasm of prostate: Secondary | ICD-10-CM | POA: Diagnosis not present

## 2023-10-20 DIAGNOSIS — Z800 Family history of malignant neoplasm of digestive organs: Secondary | ICD-10-CM | POA: Diagnosis not present

## 2023-10-20 DIAGNOSIS — Z87891 Personal history of nicotine dependence: Secondary | ICD-10-CM | POA: Diagnosis not present

## 2023-10-20 DIAGNOSIS — R634 Abnormal weight loss: Secondary | ICD-10-CM | POA: Diagnosis not present

## 2023-10-20 DIAGNOSIS — C778 Secondary and unspecified malignant neoplasm of lymph nodes of multiple regions: Secondary | ICD-10-CM | POA: Diagnosis not present

## 2023-10-20 DIAGNOSIS — Z801 Family history of malignant neoplasm of trachea, bronchus and lung: Secondary | ICD-10-CM | POA: Diagnosis not present

## 2023-10-20 MED ORDER — DIPHENHYDRAMINE HCL 50 MG/ML IJ SOLN
25.0000 mg | Freq: Once | INTRAMUSCULAR | Status: AC
  Administered 2023-10-20: 25 mg via INTRAVENOUS
  Filled 2023-10-20: qty 1

## 2023-10-20 MED ORDER — FAMOTIDINE IN NACL 20-0.9 MG/50ML-% IV SOLN
20.0000 mg | Freq: Once | INTRAVENOUS | Status: AC
  Administered 2023-10-20: 20 mg via INTRAVENOUS
  Filled 2023-10-20: qty 50

## 2023-10-20 MED ORDER — SODIUM CHLORIDE 0.9 % IV SOLN
360.0000 mg | Freq: Once | INTRAVENOUS | Status: AC
  Administered 2023-10-20: 360 mg via INTRAVENOUS
  Filled 2023-10-20: qty 12

## 2023-10-20 MED ORDER — SODIUM CHLORIDE 0.9 % IV SOLN: INTRAVENOUS | Status: DC

## 2023-10-20 MED ORDER — SODIUM CHLORIDE 0.9 % IV SOLN
1.0000 mg/kg | Freq: Once | INTRAVENOUS | Status: AC
  Administered 2023-10-20: 80 mg via INTRAVENOUS
  Filled 2023-10-20: qty 16

## 2023-10-20 NOTE — Patient Instructions (Signed)
 [facility] - A DEPT OF [facility]  Discharge Instructions: Thank you for choosing [facility] to provide your oncology and hematology care.   If you have a lab appointment with the Cancer Center, please go directly to the Cancer Center and check in at the registration area.   Wear comfortable clothing and clothing appropriate for easy access to any Portacath or PICC line.   We strive to give you quality time with your provider. You may need to reschedule your appointment if you arrive late (15 or more minutes).  Arriving late affects you and other patients whose appointments are after yours.  Also, if you miss three or more appointments without notifying the office, you may be dismissed from the clinic at the provider's discretion.      For prescription refill requests, have your pharmacy contact our office and allow 72 hours for refills to be completed.    Today you received the following chemotherapy and/or immunotherapy agents: Opdivo, Arthur Holms.       To help prevent nausea and vomiting after your treatment, we encourage you to take your nausea medication as directed.  BELOW ARE SYMPTOMS THAT SHOULD BE REPORTED IMMEDIATELY: *FEVER GREATER THAN 100.4 F (38 C) OR HIGHER *CHILLS OR SWEATING *NAUSEA AND VOMITING THAT IS NOT CONTROLLED WITH YOUR NAUSEA MEDICATION *UNUSUAL SHORTNESS OF BREATH *UNUSUAL BRUISING OR BLEEDING *URINARY PROBLEMS (pain or burning when urinating, or frequent urination) *BOWEL PROBLEMS (unusual diarrhea, constipation, pain near the anus) TENDERNESS IN MOUTH AND THROAT WITH OR WITHOUT PRESENCE OF ULCERS (sore throat, sores in mouth, or a toothache) UNUSUAL RASH, SWELLING OR PAIN  UNUSUAL VAGINAL DISCHARGE OR ITCHING   Items with * indicate a potential emergency and should be followed up as soon as possible or go to the Emergency Department if any problems should occur.  Please show the CHEMOTHERAPY ALERT CARD or  IMMUNOTHERAPY ALERT CARD at check-in to the Emergency Department and triage nurse.  Should you have questions after your visit or need to cancel or reschedule your appointment, please contact [facility] - A DEPT OF [facility]  Dept: [phone]  and follow the prompts.  Office hours are 8:00 a.m. to 4:30 p.m. Monday - Friday. Please note that voicemails left after 4:00 p.m. may not be returned until the following business day.  We are closed weekends and major holidays. You have access to a nurse at all times for urgent questions. Please call the main number to the clinic Dept: [phone] and follow the prompts.   For any non-urgent questions, you may also contact your provider using MyChart. We now offer e-Visits for anyone 30 and older to request care online for non-urgent symptoms. For details visit [email or web address].   Also download the MyChart app! Go to the app store, search "MyChart", open the app, select [place], and log in with your MyChart username and password.  Nivolumab Injection What is this medication? NIVOLUMAB (nye VOL ue mab) treats some types of cancer. It works by helping your immune system slow or stop the spread of cancer cells. It is a monoclonal antibody. This medicine may be used for other purposes; ask your health care provider or pharmacist if you have questions. COMMON BRAND NAME(S): Opdivo What should I tell my care team before I take this medication? They need to know if you have any of these conditions: Allogeneic stem cell transplant (uses someone else's stem  cells) Autoimmune diseases, such as Crohn disease, ulcerative colitis, lupus History of chest radiation Nervous system problems, such as Guillain-Barre syndrome or myasthenia gravis Organ transplant An unusual or allergic reaction to nivolumab, other medications, foods, dyes, or preservatives Pregnant or trying to get pregnant Breast-feeding How should I use  this medication? This medication is infused into a vein. It is given in a hospital or clinic setting. A special MedGuide will be given to you before each treatment. Be sure to read this information carefully each time. Talk to your care team about the use of this medication in children. While it may be prescribed for children as young as 12 years for selected conditions, precautions do apply. Overdosage: If you think you have taken too much of this medicine contact a poison control center or emergency room at once. NOTE: This medicine is only for you. Do not share this medicine with others. What if I miss a dose? Keep appointments for follow-up doses. It is important not to miss your dose. Call your care team if you are unable to keep an appointment. What may interact with this medication? Interactions have not been studied. This list may not describe all possible interactions. Give your health care provider a list of all the medicines, herbs, non-prescription drugs, or dietary supplements you use. Also tell them if you smoke, drink alcohol, or use illegal drugs. Some items may interact with your medicine. What should I watch for while using this medication? Your condition will be monitored carefully while you are receiving this medication. You may need blood work while taking this medication. This medication may cause serious skin reactions. They can happen weeks to months after starting the medication. Contact your care team right away if you notice fevers or flu-like symptoms with a rash. The rash may be red or purple and then turn into blisters or peeling of the skin. You may also notice a red rash with swelling of the face, lips, or lymph nodes in your neck or under your arms. Tell your care team right away if you have any change in your eyesight. Talk to your care team if you are pregnant or think you might be pregnant. A negative pregnancy test is required before starting this medication. A  reliable form of contraception is recommended while taking this medication and for 5 months after the last dose. Talk to your care team about effective forms of contraception. Do not breast-feed while taking this medication and for 5 months after the last dose. What side effects may I notice from receiving this medication? Side effects that you should report to your care team as soon as possible: Allergic reactions--skin rash, itching, hives, swelling of the face, lips, tongue, or throat Dry cough, shortness of breath or trouble breathing Eye pain, redness, irritation, or discharge with blurry or decreased vision Heart muscle inflammation--unusual weakness or fatigue, shortness of breath, chest pain, fast or irregular heartbeat, dizziness, swelling of the ankles, feet, or hands Hormone gland problems--headache, sensitivity to light, unusual weakness or fatigue, dizziness, fast or irregular heartbeat, increased sensitivity to cold or heat, excessive sweating, constipation, hair loss, increased thirst or amount of urine, tremors or shaking, irritability Infusion reactions--chest pain, shortness of breath or trouble breathing, feeling faint or lightheaded Kidney injury (glomerulonephritis)--decrease in the amount of urine, red or dark brown urine, foamy or bubbly urine, swelling of the ankles, hands, or feet Liver injury--right upper belly pain, loss of appetite, nausea, light-colored stool, dark yellow or brown urine, yellowing  skin or eyes, unusual weakness or fatigue Pain, tingling, or numbness in the hands or feet, muscle weakness, change in vision, confusion or trouble speaking, loss of balance or coordination, trouble walking, seizures Rash, fever, and swollen lymph nodes Redness, blistering, peeling, or loosening of the skin, including inside the mouth Sudden or severe stomach pain, bloody diarrhea, fever, nausea, vomiting Side effects that usually do not require medical attention (report these  to your care team if they continue or are bothersome): Bone, joint, or muscle pain Diarrhea Fatigue Loss of appetite Nausea Skin rash This list may not describe all possible side effects. Call your doctor for medical advice about side effects. You may report side effects to FDA at 1-800-FDA-1088. Where should I keep my medication? This medication is given in a hospital or clinic. It will not be stored at home. NOTE: This sheet is a summary. It may not cover all possible information. If you have questions about this medicine, talk to your doctor, pharmacist, or health care provider.  2024 Elsevier/Gold Standard (2021-05-25 00:00:00) Ipilimumab Injection What is this medication? IPILIMUMAB (IP i LIM ue mab) treats some types of cancer. It works by helping your immune system slow or stop the spread of cancer cells. It is a monoclonal antibody. This medicine may be used for other purposes; ask your health care provider or pharmacist if you have questions. COMMON BRAND NAME(S): YERVOY What should I tell my care team before I take this medication? They need to know if you have any of these conditions: Allogeneic stem cell transplant (uses someone else's stem cells) Autoimmune diseases, such as Crohn disease, ulcerative colitis, lupus Nervous system problems, such as Guillain-Barre syndrome or myasthenia gravis Organ transplant An unusual or allergic reaction to ipilimumab, other medications, foods, dyes, or preservatives Pregnant or trying to get pregnant Breast-feeding How should I use this medication? This medication is infused into a vein. It is given by your care team in a hospital or clinic setting. A special MedGuide will be given to you before each treatment. Be sure to read this information carefully each time. Talk to your care team about the use of this medication in children. While it may be prescribed for children as young as 12 years for selected conditions, precautions do  apply. Overdosage: If you think you have taken too much of this medicine contact a poison control center or emergency room at once. NOTE: This medicine is only for you. Do not share this medicine with others. What if I miss a dose? Keep appointments for follow-up doses. It is important not to miss your dose. Call your care team if you are unable to keep an appointment. What may interact with this medication? Interactions are not expected. This list may not describe all possible interactions. Give your health care provider a list of all the medicines, herbs, non-prescription drugs, or dietary supplements you use. Also tell them if you smoke, drink alcohol, or use illegal drugs. Some items may interact with your medicine. What should I watch for while using this medication? Your condition will be monitored carefully while you are receiving this medication. You may need blood work while taking this medication. This medication may cause serious skin reactions. They can happen weeks to months after starting the medication. Contact your care team right away if you notice fevers or flu-like symptoms with a rash. The rash may be red or purple and then turn into blisters or peeling of the skin. You may also notice a red  rash with swelling of the face, lips, or lymph nodes in your neck or under your arms. Tell your care team right away if you have any change in your eyesight. Talk to your care team if you may be pregnant. Serious birth defects can occur if you take this medication during pregnancy and for 3 months after the last dose. You will need a negative pregnancy test before starting this medication. Contraception is recommended while taking this medication and for 3 months after the last dose. Your care team can help you find the option that works for you. Do not breastfeed while taking this medication and for 3 months after the last dose. What side effects may I notice from receiving this  medication? Side effects that you should report to your care team as soon as possible: Allergic reactions--skin rash, itching, hives, swelling of the face, lips, tongue, or throat Dry cough, shortness of breath or trouble breathing Eye pain, redness, irritation, or discharge with blurry or decreased vision Heart muscle inflammation--unusual weakness or fatigue, shortness of breath, chest pain, fast or irregular heartbeat, dizziness, swelling of the ankles, feet, or hands Hormone gland problems--headache, sensitivity to light, unusual weakness or fatigue, dizziness, fast or irregular heartbeat, increased sensitivity to cold or heat, excessive sweating, constipation, hair loss, increased thirst or amount of urine, tremors or shaking, irritability Infusion reactions--chest pain, shortness of breath or trouble breathing, feeling faint or lightheaded Kidney injury (glomerulonephritis)--decrease in the amount of urine, red or dark brown urine, foamy or bubbly urine, swelling of the ankles, hands, or feet Liver injury--right upper belly pain, loss of appetite, nausea, light-colored stool, dark yellow or brown urine, yellowing skin or eyes, unusual weakness or fatigue Pain, tingling, or numbness in the hands or feet, muscle weakness, change in vision, confusion or trouble speaking, loss of balance or coordination, trouble walking, seizures Rash, fever, and swollen lymph nodes Redness, blistering, peeling, or loosening of the skin, including inside the mouth Sudden or severe stomach pain, bloody diarrhea, fever, nausea, vomiting Side effects that usually do not require medical attention (report to your care team if they continue or are bothersome): Bone, joint, or muscle pain Diarrhea Fatigue Loss of appetite Nausea Skin rash This list may not describe all possible side effects. Call your doctor for medical advice about side effects. You may report side effects to FDA at 1-800-FDA-1088. Where should I  keep my medication? This medication is given in a hospital or clinic. It will not be stored at home. NOTE: This sheet is a summary. It may not cover all possible information. If you have questions about this medicine, talk to your doctor, pharmacist, or health care provider.  2024 Elsevier/Gold Standard (2021-06-12 00:00:00)

## 2023-10-20 NOTE — Telephone Encounter (Signed)
 Home Oxygen -  Email was sent to [facility] to provide pt with oxygen .

## 2023-10-20 NOTE — Progress Notes (Signed)
 Pt c/o increased fatigue x1 week.  On arrival, pulse maintaining 108-115, SpO2 76-88 RA, 88-92 on 3 L [place].  Pt does not use supplemental O2 at home. C/o 1/10 pain in lower sternum on arrival (not new for pt) and 2/10 pain right lateral back.  EKG performed.  After 15 min sitting, pain in chest and back resolved.  SpO2 increased 95% on RA, pulse remained 105-115.  MD aware.

## 2023-10-20 NOTE — Addendum Note (Signed)
 Addended by: CAROLEE LOA DEL on: 10/20/2023 04:33 PM   Modules accepted: Orders

## 2023-10-20 NOTE — Addendum Note (Signed)
 Addended by: VIKTORIA WILMA SQUIBB on: 10/20/2023 12:48 PM   Modules accepted: Orders

## 2023-10-20 NOTE — Progress Notes (Signed)
 SATURATION QUALIFICATIONS: (This note is used to comply with regulatory documentation for home oxygen )  Patient Saturations on Room Air at Rest = 95%  Patient Saturations on Room Air while Ambulating = 88%  Patient Saturations on 1 Liters of oxygen  while Ambulating = 91%  Please briefly explain why patient needs home oxygen :  Patient desaturates with ambulation.  Patient experienced SOB, and increased fatigue.

## 2023-10-20 NOTE — Progress Notes (Signed)
 Ipilimumab  (YERVOY ) Patient Monitoring Assessment   Is the patient experiencing any of the following general symptoms?:  [x ]Difficulty performing normal activities [ x]Feeling sluggish or cold all the time [ ] Unusual weight gain [ ] Constant or unusual headaches [ x]Feeling dizzy or faint - intermittent [ x]Changes in eyesight (blurry vision, double vision, or other vision problems) - very rarely [ x]Changes in mood or behavior (ex: decreased sex drive, irritability, or forgetfulness) - forgetful [ ] Starting new medications (ex: steroids, other medications that lower immune response) [ ] Patient is not experiencing any of the general symptoms above.   Gastrointestinal  Patient is having 1 bowel movements each day.  Is this different from baseline? [ ] Yes [ ] No Are your stools watery or do they have a foul smell? [ ] Yes [x ]No Have you seen blood in your stools? [x ]Yes [ ] No - small amount of bright red blood in stool x 2 days Are your stools dark, tarry, or sticky? [ ] Yes [ x]No Are you having pain or tenderness in your belly? [ ] Yes [ x]No  Skin Does your skin itch? [ ] Yes [ x]No Do you have a rash? [ ] Yes [x ]No Has your skin blistered and/or peeled? [ ] Yes [ x]No Do you have sores in your mouth? [ ] Yes [ x]No  Hepatic Has your urine been dark or tea colored? [ ] Yes [ x]No Have you noticed that your skin or the whites of your eyes are turning yellow? [ ] Yes [x ]No Are you bleeding or bruising more easily than normal? [ ] Yes [x ]No - pt on Eliquis  x 2 years Are you nauseous and/or vomiting? [ ] Yes [x ]No Do you have pain on the right side of your stomach? [ ] Yes [ x]No  Neurologic  Are you having unusual weakness of legs, arms, or face? [x ]Yes [ ] No - generalized weakness Are you having numbness or tingling in your hands or feet? [ ] Yes [ x]No   [name]

## 2023-10-20 NOTE — Telephone Encounter (Signed)
 Message sent to [employer] for home oxygen .

## 2023-10-20 NOTE — Progress Notes (Signed)
 Received call from patient's family member after receiving my card per my request.  Introduced myself as Dance movement psychotherapist and to ask if he had any financial questions or concerns regarding treatment. She states not at this time. Advised to contact me should any arise. She verbalized understanding.  They have my card to do so and for any additional financial questions or concerns.

## 2023-10-21 ENCOUNTER — Telehealth: Payer: Self-pay | Admitting: Medical Oncology

## 2023-10-21 ENCOUNTER — Telehealth: Payer: Self-pay

## 2023-10-21 ENCOUNTER — Ambulatory Visit: Payer: Self-pay

## 2023-10-21 NOTE — Telephone Encounter (Signed)
 This RN attempted to call back wife's phone. Call disconnected on transfer from [facility]. No answer, left voicemail with call back number.        Copied from CRM #[id number]. Topic: Clinical - Red Word Triage >> Oct 21, 2023  5:02 PM [name] wrote: Red Word that prompted transfer to Nurse Triage: Patient called to let Dr. Joyce know that he started Immunotherapy on yesterday and he is feeling fatigue and a little dizziness. Patient's wife stated he is asleep and she is currently on a conference call.

## 2023-10-21 NOTE — Telephone Encounter (Signed)
-----   Message from Nurse Katrinka B sent at 10/20/2023 11:12 AM EDT ----- Regarding: Dr. Sherrod, first time Opdivo  & Yervoy  f/u call, pt tolerated well Patient performed ambulatory O2 qualification and did qualify.  Diane Bell should be setting up home O2 for pt.

## 2023-10-21 NOTE — Telephone Encounter (Signed)
 F/u hallucinations that started yesterday after his treatment..  I spoke to wife ( she was in the car at [facility]) . [name] was at home by himself. She reported that [name] woke up independently this morning, walked to the bathroom, undressed himself, and then walked back to bed. He was able to follow her commands to put on his underwear and pants. [name] acknowledged feeling confused last night. This morning, he told his wife he was seeing ants crawling on the wall. Then he requested coffee and said, I want an Egg McMuffin and add a tomato. She  said she was comfortable leaving him alone because she was only 5 minutes from the house.   Attempted to contact patient via cell phone and home phone, but received no answer.  I did talk to pt later when his wife was back home . His speech is slow and  appropriate and he acknowledged that he should just rest today .   Oxygen  -I told wife that [employer] faxed his oxygen  orders to [employer] ( in network). Tristan at [employer] said he does not see any orders for him  pt , but the orders go to their intake and it takes 24-48 hours to process them.  Wife is a Merchandiser, retail for [employer] so she  has access to oxygen /pulse oximeter. She understands ED precautions.

## 2023-10-21 NOTE — Telephone Encounter (Signed)
 This encounter was created in error - please disregard.

## 2023-10-21 NOTE — Telephone Encounter (Signed)
 FYI Only or Action Required?: FYI only for provider.  Patient was last seen in primary care on 09/15/2023 by [name], MD.  Called Nurse Triage reporting Fatigue.  Symptoms began yesterday.  Interventions attempted: Rest, hydration, or home remedies.  Symptoms are: unchanged.  Triage Disposition: See Physician Within 24 Hours  Patient/caregiver understands and will follow disposition?: No, wishes to speak with PCP

## 2023-10-21 NOTE — Telephone Encounter (Signed)
 Copied from CRM #[id number]. Topic: Clinical - Red Word Triage >> Oct 21, 2023  5:02 PM Wess RAMAN wrote: Red Word that prompted transfer to Nurse Triage: Patient called to let Dr. Joyce know that he started Immunotherapy on yesterday and he is feeling fatigue and a little dizziness. Patient's wife stated he is asleep and she is currently on a conference call.

## 2023-10-21 NOTE — Telephone Encounter (Signed)
 Reason for Disposition . Taking a medicine that could cause weakness (e.g., blood pressure medications, diuretics)  Answer Assessment - Initial Assessment Questions Additional info:  Patient's wife called in to close this call' she states [facility] called earlier to let Dr. Joyce know that he started immunotherapy yesterday and it has caused him some dizziness and fatigue. Three attempts and messages were left on home machine to call back in for triage. Wife states after calling pcp office he went for a nap and she is working all day and unable to answer a ringing phone. She states they have no needs at this time. Will call back for any questions or concerns. They have also sent a message via Mychart and spoke with cancer specialist office.     1. DESCRIPTION: Describe how you are feeling.     Fatigued 2. SEVERITY: How bad is it?  Can you stand and walk?     Yes able to stand and walk, sleep increased  3. ONSET: When did these symptoms begin? (e.g., hours, days, weeks, months)     yesterday 4. CAUSE: What do you think is causing the weakness or fatigue? (e.g., not drinking enough fluids, medical problem, trouble sleeping)     immunotherapy 5. NEW MEDICINES:  Have you started on any new medicines recently? (e.g., opioid pain medicines, benzodiazepines, muscle relaxants, antidepressants, antihistamines, neuroleptics, beta blockers)     Immunotherapy  6. OTHER SYMPTOMS: Do you have any other symptoms? (e.g., chest pain, fever, cough, SOB, vomiting, diarrhea, bleeding, other areas of pain)     Slight dizziness  Protocols used: Weakness (Generalized) and Fatigue-A-AH

## 2023-10-24 NOTE — Telephone Encounter (Unsigned)
 Copied from CRM #[id number]. Topic: General - Other >> Oct 21, 2023  4:44 PM Wess RAMAN wrote: Reason for CRM: Patient called to let Dr. Joyce know that he started Immunotherapy on yesterday and he is feeling fatigue and a little dizziness.    Callback #: [id number]

## 2023-10-24 NOTE — Telephone Encounter (Signed)
 Spoke  to patient wife and she will reach out to hematology

## 2023-10-25 ENCOUNTER — Encounter: Admitting: Thoracic Surgery (Cardiothoracic Vascular Surgery)

## 2023-10-26 ENCOUNTER — Inpatient Hospital Stay

## 2023-10-26 ENCOUNTER — Other Ambulatory Visit: Payer: Self-pay | Admitting: Medical Oncology

## 2023-10-26 ENCOUNTER — Telehealth: Payer: Self-pay | Admitting: Medical Oncology

## 2023-10-26 ENCOUNTER — Ambulatory Visit (HOSPITAL_COMMUNITY)
Admission: RE | Admit: 2023-10-26 | Discharge: 2023-10-26 | Disposition: A | Discharge status: Home / Self Care | Source: Ambulatory Visit | Attending: Nurse Practitioner | Admitting: Nurse Practitioner

## 2023-10-26 ENCOUNTER — Encounter: Payer: Self-pay | Admitting: Nurse Practitioner

## 2023-10-26 VITALS — BP 110/78 | HR 64 | Temp 97.8°F | Resp 13 | Wt 171.8 lb

## 2023-10-26 DIAGNOSIS — Z85831 Personal history of malignant neoplasm of soft tissue: Secondary | ICD-10-CM | POA: Diagnosis not present

## 2023-10-26 DIAGNOSIS — C450 Mesothelioma of pleura: Secondary | ICD-10-CM

## 2023-10-26 DIAGNOSIS — R634 Abnormal weight loss: Secondary | ICD-10-CM

## 2023-10-26 DIAGNOSIS — R531 Weakness: Secondary | ICD-10-CM | POA: Diagnosis not present

## 2023-10-26 DIAGNOSIS — R630 Anorexia: Secondary | ICD-10-CM | POA: Diagnosis not present

## 2023-10-26 DIAGNOSIS — R0602 Shortness of breath: Secondary | ICD-10-CM | POA: Insufficient documentation

## 2023-10-26 DIAGNOSIS — Z515 Encounter for palliative care: Secondary | ICD-10-CM | POA: Diagnosis not present

## 2023-10-26 DIAGNOSIS — G893 Neoplasm related pain (acute) (chronic): Secondary | ICD-10-CM

## 2023-10-26 DIAGNOSIS — R918 Other nonspecific abnormal finding of lung field: Secondary | ICD-10-CM | POA: Diagnosis not present

## 2023-10-26 DIAGNOSIS — R0781 Pleurodynia: Secondary | ICD-10-CM | POA: Diagnosis not present

## 2023-10-26 DIAGNOSIS — R059 Cough, unspecified: Secondary | ICD-10-CM | POA: Diagnosis not present

## 2023-10-26 DIAGNOSIS — Z7189 Other specified counseling: Secondary | ICD-10-CM

## 2023-10-26 DIAGNOSIS — R530 Neoplastic (malignant) related fatigue: Secondary | ICD-10-CM | POA: Diagnosis not present

## 2023-10-26 MED ORDER — HYDROCODONE-ACETAMINOPHEN 5-325 MG PO TABS: 1.0000 | ORAL_TABLET | Freq: Four times a day (QID) | ORAL | 0 refills | Status: DC | PRN

## 2023-10-26 NOTE — Progress Notes (Signed)
 Palliative Medicine [facility]  [phone] [phone]   Name: [name] Date: 10/26/2023 MRN: [id number]  DOB: 1951-01-24  Patient Care Team: [name], MD as PCP - General (Family Medicine) Verlin Lonni BIRCH, MD as PCP - Cardiology (Cardiology) Cindie Ole DASEN, MD as PCP - Electrophysiology (Cardiology) Devere Lonni Righter, MD as Consulting Physician (Urology) Grayce Buddle, RN Nurse Navigator as Registered Nurse (Medical Oncology) Patrcia Cough, MD as Consulting Physician (Radiation Oncology) Lionell Jon DEL, [facility] (Pharmacist) Morgan Clayborne CROME, RN as VBCI Care Management    REASON FOR CONSULTATION: [name] is a 73 y.o. 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 Patient to start on palliative immunotherapy. Palliative is seeing patient for symptom management and goals of care.    SOCIAL HISTORY:     reports that he has quit smoking. His smoking use included cigarettes. He started smoking about 45 years ago. He has a 14 pack-year smoking history. He has never been exposed to tobacco smoke. He has never used smokeless tobacco. He reports current alcohol use of about 1.0 standard drink of alcohol per week. He reports that he does not use drugs.  ADVANCE DIRECTIVES:  None on file. Education provided   CODE STATUS: Full code  PAST MEDICAL HISTORY: Past Medical History:  Diagnosis Date   BCE (basal cell epithelioma), arm    RIGHT SHOULDER   Bradycardia    Bumps on skin    on left side of nose for last 3 weeks   CAD (coronary artery disease)    a. s/p CABGx2 10/11/18 LIMA to LAD, SVG to RCA, EVH via right thigh.   Dyslipidemia    Dysrhythmia    aflutter/afib   Ectatic thoracic aorta (HCC)    Esophageal stricture    GERD (gastroesophageal reflux disease)    Heart murmur    sees dr  jerrald   Hyperlipidemia    Incidental pulmonary nodule, > 3mm and < 8mm 10/04/2018   Noted on CTA   Inguinal hernia    bilateral   Obesity    Presence of permanent cardiac pacemaker    Prostate cancer (HCC)    S/P aortic valve replacement with bioprosthetic valve 10/11/2018   23 mm Edwards Inspiris Resilia stented bovine pericardial tissue valve, miltral valve done also   S/P CABG x 2 10/11/2018   LIMA to LAD, SVG to RCA, EVH via right thigh   S/P mitral valve replacement with bioprosthetic valve 10/11/2018   27 mm Medtronic Mosaic stented porcine bioprosthetic tissue valve   S/P patent foramen ovale closure 10/11/2018   S/P placement of cardiac pacemaker 2020   Smoker    Stroke [facility])     PAST SURGICAL HISTORY:  Past Surgical History:  Procedure Laterality Date   AORTIC VALVE REPLACEMENT N/A 10/11/2018   Procedure: AORTIC VALVE REPLACEMENT (AVR) with 23 Inspiris Bioprosthetic Aortic valve.;  Surgeon: Dusty Sudie DEL, MD;  Location: [facility] OR;  Service: Open Heart Surgery;  Laterality: N/A;   AV FISTULA PLACEMENT Left 06/15/2022   Procedure: LEFT UPPER EXTREMITY THROMBECTOMY;  Surgeon: Sheree Penne Lonni, MD;  Location: [facility] OR;  Service: Vascular;  Laterality: Left;   BIOPSY  03/22/2023   Procedure: BIOPSY;  Surgeon: Elicia Claw, MD;  Location: [facility] ENDOSCOPY;  Service: Gastroenterology;;   CLIPPING OF ATRIAL APPENDAGE N/A 10/11/2018   Procedure: CLIPPING OF LEFT ATRIAL APPENDAGE with 45 AtriCure Clip.;  Surgeon: Dusty Sudie DEL, MD;  Location: [facility] OR;  Service: Open Heart Surgery;  Laterality: N/A;   COLONOSCOPY  03/2008,2004   Dr. Dyane   COLONOSCOPY WITH PROPOFOL  N/A 03/22/2023   Procedure: COLONOSCOPY WITH PROPOFOL ;  Surgeon: Elicia Claw, MD;  Location: [facility] ENDOSCOPY;  Service: Gastroenterology;  Laterality: N/A;   CORONARY ARTERY BYPASS GRAFT N/A 10/11/2018   Procedure: CORONARY ARTERY BYPASS GRAFTING (CABG) x 2 using LIMA to the LAD and endoscopic greater saphenous vein  harvesting to the RCA.;  Surgeon: Dusty Sudie DEL, MD;  Location: [facility] OR;  Service: Open Heart Surgery;  Laterality: N/A;   CYSTOSCOPY  04/04/2019   Procedure: CYSTOSCOPY;  Surgeon: Devere Lonni Righter, MD;  Location: [facility];  Service: Urology;;   ESOPHAGOGASTRODUODENOSCOPY (EGD) WITH PROPOFOL  N/A 03/22/2023   Procedure: ESOPHAGOGASTRODUODENOSCOPY (EGD) WITH PROPOFOL ;  Surgeon: Elicia Claw, MD;  Location: [facility]OS[facility];  Service: Gastroenterology;  Laterality: N/A;   HERNIA REPAIR  12/11/10   BIH   IR IMAGING GUIDED PORT INSERTION  10/13/2023   MITRAL VALVE REPLACEMENT N/A 10/11/2018   Procedure: MITRAL VALVE (MV) REPLACEMENT with 27 Mosaic Bioprosthetic Mitral valve.;  Surgeon: Dusty Sudie DEL, MD;  Location: [facility] OR;  Service: Open Heart Surgery;  Laterality: N/A;   PACEMAKER IMPLANT N/A 10/17/2018   Procedure: PACEMAKER IMPLANT;  Surgeon: Kelsie Agent, MD;  Location: [facility] INVASIVE CV LAB;  Service: Cardiovascular;  Laterality: N/A;   POLYPECTOMY  03/22/2023   Procedure: POLYPECTOMY;  Surgeon: Elicia Claw, MD;  Location: [facility];  Service: Gastroenterology;;   RADIOACTIVE SEED IMPLANT N/A 04/04/2019   Procedure: RADIOACTIVE SEED IMPLANT/BRACHYTHERAPY IMPLANT, cystoscopy;  Surgeon: Devere Lonni Righter, MD;  Location: [name];  Service: Urology;  Laterality: N/A;  58 seeds   REPAIR OF PATENT FORAMEN OVALE N/A 10/11/2018   Procedure: CLOSURE OF PATENT FORAMEN OVALE;  Surgeon: Dusty Sudie DEL, MD;  Location: [facility] OR;  Service: Open Heart Surgery;  Laterality: N/A;   RIGHT/LEFT HEART CATH AND CORONARY ANGIOGRAPHY N/A 09/15/2018   Procedure: RIGHT/LEFT HEART CATH AND CORONARY ANGIOGRAPHY;  Surgeon: Verlin Lonni BIRCH, MD;  Location: [facility]VAS[facility] CV LAB;  Service: Cardiovascular;  Laterality: N/A;   RIGHT/LEFT HEART CATH AND CORONARY/GRAFT ANGIOGRAPHY N/A 05/25/2022   Procedure: RIGHT/LEFT HEART CATH AND CORONARY/GRAFT ANGIOGRAPHY;  Surgeon:  Verlin Lonni BIRCH, MD;  Location: [facility]VAS[facility] CV LAB;  Service: Cardiovascular;  Laterality: N/A;   TEE WITHOUT CARDIOVERSION N/A 12/12/2015   Procedure: TRANSESOPHAGEAL ECHOCARDIOGRAM (TEE);  Surgeon: Jerel Balding, MD;  Location: [facility] ENDOSCOPY;  Service: Cardiovascular;  Laterality: N/A;   TEE WITHOUT CARDIOVERSION N/A 10/11/2018   Procedure: TRANSESOPHAGEAL ECHOCARDIOGRAM (TEE);  Surgeon: Dusty Sudie DEL, MD;  Location: [facility] OR;  Service: Open Heart Surgery;  Laterality: N/A;   TEE WITHOUT CARDIOVERSION N/A 06/16/2022   Procedure: TRANSESOPHAGEAL ECHOCARDIOGRAM;  Surgeon: Gardenia Led, DO;  Location: [facility] INVASIVE CV LAB;  Service: Cardiovascular;  Laterality: N/A;   THYMECTOMY  1976   radaiation tx done    HEMATOLOGY/ONCOLOGY HISTORY:  Oncology History  Malignant neoplasm of prostate (HCC)  08/10/2018 Cancer Staging   Staging form: Prostate, AJCC 8th Edition - Clinical stage from 08/10/2018: Stage IIB (cT1c, cN0, cM0, PSA: 5.6, Grade Group: 2) - Signed by Sherwood Rise, PA-C on 03/08/2019   02/06/2019 Initial Diagnosis   Malignant neoplasm of prostate (HCC)   04/04/2019 -  Radiation Therapy   Prostate Brachytherapy Implant (Dr. Sue Barge) - Insertion of radioactive I-125 seeds into the prostate gland. - Radiation Dose: 145 Gy, definitive  therapy.    Mesothelioma (pleural) (HCC)  10/11/2023 Initial Diagnosis   Mesothelioma (pleural) (HCC)   10/20/2023 -  Chemotherapy   Patient is on Treatment Plan : LUNG MESOTHELIOMA Nivolumab  (360) D1,22 + Ipilimumab  (1) D1 q42d       ALLERGIES:  is allergic to losartan .  MEDICATIONS:  Current Outpatient Medications  Medication Sig Dispense Refill   acetaminophen  (TYLENOL ) 325 MG tablet Take 2 tablets (650 mg total) by mouth every 4 (four) hours as needed for moderate pain.     amoxicillin  (AMOXIL ) 500 MG capsule Take 1 capsule (500 mg total) by mouth 3 (three) times daily. 90 capsule 5   atorvastatin  (LIPITOR) 20 MG tablet TAKE 1  TABLET BY MOUTH EVERY DAY 90 tablet 3   cetirizine (ZYRTEC) 10 MG tablet Take 10 mg by mouth daily.     digoxin  (LANOXIN ) 0.125 MG tablet TAKE 1 TABLET BY MOUTH EVERY DAY 90 tablet 3   ELIQUIS  5 MG TABS tablet TAKE 1 TABLET BY MOUTH 2 TIMES A DAY 60 tablet 11   empagliflozin  (JARDIANCE ) 25 MG TABS tablet Take 1 tablet (25 mg total) by mouth daily before breakfast. 90 tablet 1   furosemide  (LASIX ) 20 MG tablet Take 1 tablet (20 mg total) by mouth daily. Patient is taking every other day. 90 tablet 0   HYDROcodone -acetaminophen  (NORCO/VICODIN) 5-325 MG tablet Take 1 tablet by mouth every 6 (six) hours as needed for moderate pain (pain score 4-6). 45 tablet 0   lidocaine -prilocaine  (EMLA ) cream Apply to affected area once 30 g 3   metoprolol  succinate (TOPROL -XL) 100 MG 24 hr tablet TAKE 1 TABLET BY MOUTH EVERY DAY (TAKE WITH OR IMMEDIATELY FOLLOWING A MEAL) 90 tablet 3   mirtazapine  (REMERON ) 15 MG tablet Take 1 tablet (15 mg total) by mouth at bedtime. 30 tablet 0   omeprazole  (PRILOSEC  OTC) 20 MG tablet Take 1 tablet (20 mg total) by mouth daily. 90 tablet 3   ondansetron  (ZOFRAN ) 8 MG tablet Take 1 tablet (8 mg total) by mouth every 8 (eight) hours as needed for nausea or vomiting. 30 tablet 1   ondansetron  (ZOFRAN -ODT) 4 MG disintegrating tablet Take 1 tablet (4 mg total) by mouth every 8 (eight) hours as needed for nausea or vomiting. 30 tablet 1   oxymetazoline (AFRIN) 0.05 % nasal spray Place 1 spray into both nostrils at bedtime as needed for congestion.     polyethylene glycol powder (GLYCOLAX /MIRALAX ) 17 GM/SCOOP powder Take 17 g by mouth daily as needed for mild constipation. 238 g 0   prochlorperazine  (COMPAZINE ) 10 MG tablet Take 1 tablet (10 mg total) by mouth every 6 (six) hours as needed for nausea or vomiting. 30 tablet 1   spironolactone  (ALDACTONE ) 25 MG tablet Take 1 tablet (25 mg total) by mouth daily. NEEDS FOLLOW UP APPOINTMENT FOR MORE REFILLS 90 tablet 0   No current  facility-administered medications for this visit.    VITAL SIGNS: BP 110/78 (BP Location: Left Arm, Patient Position: Sitting)   Pulse 64   Temp 97.8 F (36.6 C) (Temporal)   Resp 13   Wt 171 lb 12.8 oz (77.9 kg)   SpO2 94%   BMI 26.91 kg/m  Filed Weights   10/26/23 1056  Weight: 171 lb 12.8 oz (77.9 kg)    Estimated body mass index is 26.91 kg/m as calculated from the following:   Height as of 10/11/23: 5' 7 (1.702 m).   Weight as of this encounter: 171 lb 12.8 oz (77.9  kg).  LABS: CBC:    Component Value Date/Time   WBC 8.5 10/17/2023 1034   WBC 7.9 10/05/2023 1009   HGB 13.0 10/17/2023 1034   HGB 13.3 08/30/2023 1007   HCT 41.4 10/17/2023 1034   HCT 42.6 08/30/2023 1007   PLT 267 10/17/2023 1034   PLT 245 08/30/2023 1007   MCV 76.8 (L) 10/17/2023 1034   MCV 83 08/30/2023 1007   NEUTROABS 6.6 10/17/2023 1034   NEUTROABS 5.7 08/30/2023 1007   LYMPHSABS 0.5 (L) 10/17/2023 1034   LYMPHSABS 0.5 (L) 08/30/2023 1007   MONOABS 1.0 10/17/2023 1034   EOSABS 0.1 10/17/2023 1034   EOSABS 0.2 08/30/2023 1007   BASOSABS 0.1 10/17/2023 1034   BASOSABS 0.1 08/30/2023 1007   Comprehensive Metabolic Panel:    Component Value Date/Time   NA 139 10/17/2023 1034   NA 133 (L) 08/30/2023 1007   K 3.9 10/17/2023 1034   CL 104 10/17/2023 1034   CO2 27 10/17/2023 1034   BUN 17 10/17/2023 1034   BUN 19 08/30/2023 1007   CREATININE 0.71 10/17/2023 1034   CREATININE 1.10 08/09/2023 1455   GLUCOSE 116 (H) 10/17/2023 1034   CALCIUM  9.0 10/17/2023 1034   AST 14 (L) 10/17/2023 1034   ALT 7 10/17/2023 1034   ALKPHOS 125 10/17/2023 1034   BILITOT 1.1 10/17/2023 1034   PROT 7.4 10/17/2023 1034   PROT 7.3 08/30/2023 1007   ALBUMIN  3.2 (L) 10/17/2023 1034   ALBUMIN  3.8 08/30/2023 1007    RADIOGRAPHIC STUDIES: IR IMAGING GUIDED PORT INSERTION Result Date: 10/13/2023 CLINICAL DATA:  Right-sided stage III malignant pleural mesothelioma. EXAM: Chest port catheter placement TECHNIQUE:  Procedure performed using fluoroscopy and ultrasound CONTRAST:  None RADIOPHARMACEUTICALS:  None FLUOROSCOPY: 35.3 mGy COMPARISON:  None FINDINGS: The patient was placed in supine position on the IR gantry and the right upper chest and neck were prepped and draped in the usual sterile fashion. The nurse administered intravenous fentanyl  and Versed  under my supervision and the nurse had no other injuries other than monitoring the patient and administering medications. I was present for the entire duration of procedure. 1 mg intravenous Versed  and 75 mcg intravenous fentanyl  were administered for a total sedation time of 54 minutes. Ultrasound guidance was used to investigate the right internal jugular vein which was anechoic and compressible indicating patency, however the vein was diminutive in appears to significantly decrease caliber and I determine if it was patent into the mediastinum. Given this appearance right external jugular vein was used for access. Ultrasound demonstrated the external jugular vein to be anechoic and compressible. The left internal jugular vein was not used as the patient has a pacemaker on the left side. The needle was then advanced from a scan negative through the soft tissue into the right external jugular vein under ultrasound guidance. A final image was obtained and stored in the patient's permanent medical record. Access was then exchanged over a guidewire which was advanced under fluoroscopic guidance. The needle was removed and replaced with a micropuncture sheath. Approximately 2 inches below the clavicle the port pocket was created with a subsequent incision. The catheter was then tunneled from the port pocket to the venotomy site overlying the right internal jugular vein. Access was then exchanged over an 035 guidewire for peel-away sheath which was advanced over the guidewire under fluoroscopic guidance. The catheter was then advanced through the peel-away sheath to the  cavoatrial junction. Sheath was removed. The catheter was then cut at the port  pocket and connected to chest port. The chest port was tested for function and finally function well. The chest port was then flushed with heparin  and a port pocket was closed with 4-0 suture. Final image was obtained demonstrating satisfactory position of chest port. The final count of all materials was satisfactory. IMPRESSION: 1. Satisfactory placement of right internal jugular vein single-lumen chest port. The tip of the is at cavoatrial junction. 2.  Okay to use and power inject chest port. Electronically Signed   By: Cordella Banner   On: 10/13/2023 14:44   DG Chest Port 1 View Result Date: 10/06/2023 EXAM: 1 VIEW XRAY OF THE CHEST 10/05/2023 01:10:00 PM COMPARISON: 04/09/2023, recent PET/CT CLINICAL HISTORY: S/P biopsy [id number]. S/p right lung biopsy. FINDINGS: LUNGS AND PLEURA: Low lung volumes. No pneumothorax. Moderate right pleural effusion, partially loculated. Patchy airspace opacities in right lung. Linear subsegmental atelectasis in left lung. HEART AND MEDIASTINUM: Prior median sternotomy with prosthetic cardiac valve. Left-sided pacemaker in place. Aortic atherosclerosis. BONES AND SOFT TISSUES: No acute osseous abnormality. IMPRESSION: 1. No pneumothorax post right lung biopsy. 2. Partially loculated right pleural effusion with right lung opacities. Electronically signed by: Andrea Gasman MD 10/06/2023 09:23 AM EDT RP Workstation: [id number]   CT LUNG MASS BIOPSY Result Date: 10/05/2023 INDICATION: 73 year old with diffuse right pleural thickening and suspicious for a neoplastic or metastatic process. Tissue diagnosis is needed. EXAM: CT-GUIDED BIOPSY OF RIGHT PLEURAL THICKENING TECHNIQUE: Multidetector CT imaging of the chest was performed following the standard protocol without IV contrast. RADIATION DOSE REDUCTION: This exam was performed according to the departmental dose-optimization program which includes  automated exposure control, adjustment of the mA and/or kV according to patient size and/or use of iterative reconstruction technique. MEDICATIONS: Moderate sedation ANESTHESIA/SEDATION: Moderate (conscious) sedation was employed during this procedure. A total of Versed  2 mg and Fentanyl  100 mcg was administered intravenously by the radiology nurse. Total intra-service moderate Sedation Time: 19 minutes. The patient's level of consciousness and vital signs were monitored continuously by radiology nursing throughout the procedure under my direct supervision. COMPLICATIONS: None immediate. PROCEDURE: Informed written consent was obtained from the patient after a thorough discussion of the procedural risks, benefits and alternatives. All questions were addressed. A timeout was performed prior to the initiation of the procedure. Patient was placed prone and CT images of the chest were obtained. Pleural thickening along the posterolateral aspect of the right chest was targeted. Posterior right lower chest was prepped with chlorhexidine  and sterile field was created. Skin was anesthetized using 1% lidocaine . An small incision was made. Using CT guidance, 17 gauge coaxial needle was directed into the posterolateral right pleural thickening. Needle position was confirmed with CT. Total of 3 core 18 gauge biopsies were performed. Two adequate specimens obtained. 17 gauge needle was removed without complication. Follow up CT images were obtained. Bandage placed over the puncture site. FINDINGS: Diffuse right pleural thickening. Small amount of right pleural fluid. Needle position confirmed within the posterolateral right pleural thickening. Two adequate specimens obtained. No pneumothorax on the post biopsy images. IMPRESSION: CT-guided core biopsies of the right pleural thickening. Electronically Signed   By: Juliene Balder M.D.   On: 10/05/2023 12:12   NM PET Image Initial (PI) Skull Base To Thigh Result Date:  09/26/2023 CLINICAL DATA:  Initial treatment strategy for right pleural thickening. EXAM: NUCLEAR MEDICINE PET SKULL BASE TO THIGH TECHNIQUE: 9.3 mCi F-18 FDG was injected intravenously. Full-ring PET imaging was performed from the skull base to thigh after the  radiotracer. CT data was obtained and used for attenuation correction and anatomic localization. Fasting blood glucose: 108 mg/dl COMPARISON:  CT chest 91/94/7974 and CT abdomen pelvis. FINDINGS: Mediastinal blood pool activity: SUV max 2.8 Liver activity: SUV max NA NECK: No abnormal hypermetabolism. Incidental CT findings: None. CHEST: Extensive hypermetabolic nodular pleural thickening throughout the right hemithorax, SUV max 12.2. 7 mm apical left upper lobe nodule (4/59), SUV max 1.5. Hypermetabolic low right paratracheal lymph node measures 1.4 cm, SUV max 4.0. No additional abnormal hypermetabolism. Incidental CT findings: Atherosclerotic calcification of the aorta and coronary arteries. Aortic valve replacement. Heart is enlarged. No pericardial effusion. Small right pleural effusion. ABDOMEN/PELVIS: No abnormal hypermetabolism. Incidental CT findings: Punctate left renal stone.  Radiotherapy seeds in the prostate. SKELETON: No abnormal hypermetabolism. Incidental CT findings: Degenerative changes in the spine. IMPRESSION: 1. Extensive hypermetabolic nodular pleural thickening in the right hemithorax with hypermetabolic ipsilateral mediastinal adenopathy, findings likely due to metastatic prostate cancer. Primary bronchogenic carcinoma is not excluded. 2. 7 mm apical left upper lobe nodule does not show metabolism above blood pool but is too small for PET resolution. Recommend attention on follow-up as a metastasis is suspected. 3. Small associated right pleural effusion. 4. Punctate left renal stone. 5. Aortic atherosclerosis (ICD10-I70.0). Coronary artery calcification. Electronically Signed   By: Newell Eke M.D.   On: 09/26/2023 16:36    PERFORMANCE STATUS (ECOG) : 3 - Symptomatic, >50% confined to bed  Review of Systems  Constitutional:  Positive for activity change, appetite change, fatigue and unexpected weight change.  Respiratory:  Positive for cough.   Gastrointestinal:  Positive for abdominal pain.  Neurological:  Positive for weakness.  Unless otherwise noted, a complete review of systems is negative.  Physical Exam General: NAD Cardiovascular: regular rate and rhythm Pulmonary: diminished bilaterally, left side inspiratory wheeze, productive cough Abdomen: soft, nontender, + bowel sounds Extremities: no edema, no joint deformities Skin: no rashes Neurological: Alert and oriented x3  Discussed the use of AI scribe software for clinical note transcription with the patient, who gave verbal consent to proceed.  History of Present Illness [name] is a 73 year old male with malignant pleural mesothelioma who presents for his initial palliative care visit. He is accompanied by his wife, [name]. He was referred by his oncologist, Dr. Sherrod for pain management. Patient is alert and able to engage appropriately in discussions.   I introduced myself, [place] RN, and Palliative's role in collaboration with the oncology team. Concept of Palliative Care was introduced as specialized medical care for people and their families living with serious illness.  It focuses on providing relief from the symptoms and stress of a serious illness.  The goal is to improve quality of life for both the patient and the family. Values and goals of care important to patient and family were attempted to be elicited.  Mr. Scheib lives in the home with his wife of more than 21 years. He has stepchildren who are like his own.   He has experienced significant weight loss of approximately 40 pounds over the past six to nine months, with his weight decreasing from 186 pounds on August 8 to 171 pounds today. He has a poor appetite despite  attempts to eat more and uses mirtazapine  at bedtime, which aids sleep but not significantly with appetite. He consumes Premier protein shakes mixed with yogurt ice cream due to stomach discomfort from regular ice cream. We discussed use of CBD/THC supplements to assist with pain  and appetite. He has tried brownies. Discussed use of SL drops and gummies.  At this time will continue to closely monitor post initiation of treatment and consider other interventions for appetite as needed.   In the past two to three weeks, he has experienced worsening shortness of breath, exacerbated by activity and present at rest. He has developed a productive cough with yellow sputum over the last three to four days. No fever or chills. He spends most of his time in bed or in a recliner, with limited mobility, and does not use a cane or walker. Cough has become bothersome. He has not yet received ordered oxygen . Advised we will follow-up on this ensuring delivery.   No issues with nausea or vomiting recently and reports regular bowel movements without the need for stool softeners.   Mr. Windmiller experiences pain primarily when coughing, with rib pain on both the right and left sides. He takes hydrocodone  5 mg twice daily for pain management, which he finds helpful. No adjustments to regimen at this time.   Goals of Care We discussed his current illness and what it means in the larger context of his on-going co-morbidities. Natural disease trajectory and expectations were discussed.  Mr. Apo and his wife are realistic in their understanding of his noncurative cancer. Per notations from Oncology prognosis of 15-69months. He is remaining hopeful at this time while taking things one day at a time. Speaks to plans of initiation immunotherapy.   Patient and his wife are clear in expressed wishes to continue to treat the treatable allowing him every opportunity to thrive.   I empathetically approached discussions regarding  advanced directives, code status, and healthcare limitations. He acknowledges he does not have documents in place however if he is unable to speak for himself or his wife [name] would be his medical decision-maker.  Patient did not engage further in goals of care discussions.  Education provided on advance directive clinic including dates and ability to schedule as he desires.  I discussed the importance of continued conversation with family and their medical providers regarding overall plan of care and treatment options, ensuring decisions are within the context of the patients values and GOCs. Assessment & Plan  Established therapeutic relationship. Education provided on palliative's role in collaboration with their Oncology/Radiation team.  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 Non-curative. Estimated survival of 15-18 months. Immunotherapy planned for palliative treatment per oncology.   Neoplasm-related pain Pain associated with mesothelioma, primarily during coughing, localized to the ribs bilaterally. Managed with hydrocodone  5 mg twice daily, providing relief. - Refill hydrocodone  5.325 mg prescription - Consider prescription strength cough medicine if needed  Dyspnea and productive cough Worsening dyspnea and productive cough with yellow sputum over the past 3-4 days. Concern for potential pneumonia in the unaffected lung. Wheezing present. No fever or chills reported. - Order chest x-ray to evaluate for pneumonia - Recommend use of incentive spirometer every hour while awake - Ensure oxygen  delivery is arranged - Consider Mucinex  for mucus management  Unintentional weight loss and decreased appetite Significant weight loss of approximately 40 pounds over the past 6-9 months. Decreased appetite persists despite mirtazapine  use. Attempts to increase caloric intake with protein shakes and yogurt ice cream. -  Continue mirtazapine  for appetite stimulation - Encourage use of protein shakes and yogurt ice cream - Discussed use of CBD/THC Gummies and or sublingual drops.  Impaired mobility due to debility Spends majority of time  in bed or recliner, with limited mobility. No use of assistive devices reported. Concerns about potential deconditioning due to inactivity. - Encourage use of incentive spirometer to improve lung function  I will plan to see patient back in 2-3 weeks.  Sooner if needed.  Patient expressed understanding and was in agreement with this plan. He also understands that He can call the clinic at any time with any questions, concerns, or complaints.   Thank you for your referral and allowing Palliative to assist in Mr. RAYMOND BHARDWAJ care.   Number and complexity of problems addressed: HIGH - 1 or more chronic illnesses with SEVERE exacerbation, progression, or side effects of treatment - advanced cancer, pain. Any controlled substances utilized were prescribed in the context of palliative care.  Visit consisted of counseling and education dealing with the complex and emotionally intense issues of symptom management and palliative care in the setting of serious and potentially life-threatening illness.  Signed by: Levon Borer, AGPCNP-BC Palliative Medicine Team/[facility]

## 2023-10-26 NOTE — Progress Notes (Signed)
 Oxygen  order sent to [facility].

## 2023-10-26 NOTE — Telephone Encounter (Signed)
 Coretta said the order for oxygen   was received by [facility]. I spoke to Dolanda.

## 2023-10-27 ENCOUNTER — Other Ambulatory Visit: Payer: Self-pay

## 2023-10-27 ENCOUNTER — Inpatient Hospital Stay

## 2023-10-27 ENCOUNTER — Inpatient Hospital Stay (HOSPITAL_BASED_OUTPATIENT_CLINIC_OR_DEPARTMENT_OTHER): Admitting: Internal Medicine

## 2023-10-27 ENCOUNTER — Telehealth: Payer: Self-pay | Admitting: Medical Oncology

## 2023-10-27 VITALS — BP 138/78 | HR 100 | Temp 97.5°F | Resp 17 | Ht 67.0 in | Wt 171.0 lb

## 2023-10-27 DIAGNOSIS — C450 Mesothelioma of pleura: Secondary | ICD-10-CM

## 2023-10-27 DIAGNOSIS — Z801 Family history of malignant neoplasm of trachea, bronchus and lung: Secondary | ICD-10-CM | POA: Diagnosis not present

## 2023-10-27 DIAGNOSIS — Z800 Family history of malignant neoplasm of digestive organs: Secondary | ICD-10-CM | POA: Diagnosis not present

## 2023-10-27 DIAGNOSIS — R634 Abnormal weight loss: Secondary | ICD-10-CM | POA: Diagnosis not present

## 2023-10-27 DIAGNOSIS — Z8546 Personal history of malignant neoplasm of prostate: Secondary | ICD-10-CM | POA: Diagnosis not present

## 2023-10-27 DIAGNOSIS — Z5112 Encounter for antineoplastic immunotherapy: Secondary | ICD-10-CM | POA: Diagnosis not present

## 2023-10-27 DIAGNOSIS — Z87891 Personal history of nicotine dependence: Secondary | ICD-10-CM | POA: Diagnosis not present

## 2023-10-27 DIAGNOSIS — C778 Secondary and unspecified malignant neoplasm of lymph nodes of multiple regions: Secondary | ICD-10-CM | POA: Diagnosis not present

## 2023-10-27 LAB — CMP (CANCER CENTER ONLY)
ALT: 6 U/L (ref 0–44)
AST: 17 U/L (ref 15–41)
Albumin: 2.8 g/dL — ABNORMAL LOW (ref 3.5–5.0)
Alkaline Phosphatase: 125 U/L (ref 38–126)
Anion gap: 7 (ref 5–15)
BUN: 19 mg/dL (ref 8–23)
CO2: 26 mmol/L (ref 22–32)
Calcium: 8.5 mg/dL — ABNORMAL LOW (ref 8.9–10.3)
Chloride: 107 mmol/L (ref 98–111)
Creatinine: 0.78 mg/dL (ref 0.61–1.24)
GFR, Estimated: >60 mL/min (ref >60)
Glucose, Bld: 104 mg/dL — ABNORMAL HIGH (ref 70–99)
Potassium: 3.7 mmol/L (ref 3.5–5.1)
Sodium: 140 mmol/L (ref 135–145)
Total Bilirubin: 1.2 mg/dL (ref 0.0–1.2)
Total Protein: 7 g/dL (ref 6.5–8.1)

## 2023-10-27 LAB — CBC WITH DIFFERENTIAL (CANCER CENTER ONLY)
Abs Immature Granulocytes: 0.09 K/uL — ABNORMAL HIGH (ref 0.00–0.07)
Basophils Absolute: 0.1 K/uL (ref 0.0–0.1)
Basophils Relative: 1 %
Eosinophils Absolute: 0.1 K/uL (ref 0.0–0.5)
Eosinophils Relative: 2 %
HCT: 42.8 % (ref 39.0–52.0)
Hemoglobin: 13.4 g/dL (ref 13.0–17.0)
Immature Granulocytes: 1 %
Lymphocytes Relative: 9 %
Lymphs Abs: 0.7 K/uL (ref 0.7–4.0)
MCH: 23.9 pg — ABNORMAL LOW (ref 26.0–34.0)
MCHC: 31.3 g/dL (ref 30.0–36.0)
MCV: 76.4 fL — ABNORMAL LOW (ref 80.0–100.0)
Monocytes Absolute: 1 K/uL (ref 0.1–1.0)
Monocytes Relative: 13 %
Neutro Abs: 5.5 K/uL (ref 1.7–7.7)
Neutrophils Relative %: 74 %
Platelet Count: 213 K/uL (ref 150–400)
RBC: 5.6 MIL/uL (ref 4.22–5.81)
RDW: 19.1 % — ABNORMAL HIGH (ref 11.5–15.5)
WBC Count: 7.5 K/uL (ref 4.0–10.5)
nRBC: 0 % (ref 0.0–0.2)

## 2023-10-27 NOTE — Telephone Encounter (Signed)
 Per [facility] receptionist , oxygen  equipment delivered yesterday .

## 2023-10-27 NOTE — Progress Notes (Signed)
 [facility] [phone]   [phone]  OFFICE PROGRESS NOTE  Michael Norleen BROCKS, MD [address] 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   PRIOR THERAPY: None  CURRENT THERAPY: Systemic immunotherapy with ipilimumab  1 Mg/KG every 6 weeks in addition to nivolumab  360 Mg IV every 3 weeks.  Status post day 1 of cycle #1.  INTERVAL HISTORY: [name] 73 y.o. male returns to the clinic today for follow-up visit. Discussed the use of AI scribe software for clinical note transcription with the patient, who gave verbal consent to proceed.  History of Present Illness [name] is a 73 year old male with stage three right malignant pleural mesothelioma who presents for evaluation before starting day twenty two of cycle one of systemic immunotherapy. He is accompanied by his wife.  He was diagnosed with stage three right malignant pleural mesothelioma in August 2025 and is currently undergoing systemic immunotherapy with ibrumumab and nivolumab . He is on post day one of cycle one and is here for evaluation before starting day twenty two of the same cycle.  He experiences significant fatigue, spending approximately twenty hours a day in bed. Despite this, his hemoglobin level is 13.4, and his white blood count and platelets have been reported. He has persistent shortness of breath, especially when walking to the bathroom, and is unable to stand for long periods, such as when shaving. He uses a walker to aid mobility.  There is concern about fluid accumulation around his right lung, as indicated by a recent x-ray. He has a history of endocarditis from the previous year, which significantly impacted his health.  In the review of symptoms, no headaches or  significant changes in vision, although he notes a slight change in vision. He remains mostly at home and is unable to engage in many activities due to his fatigue and shortness of breath.     MEDICAL HISTORY: Past Medical History:  Diagnosis Date   BCE (basal cell epithelioma), arm    RIGHT SHOULDER   Bradycardia    Bumps on skin    on left side of nose for last 3 weeks   CAD (coronary artery disease)    a. s/p CABGx2 10/11/18 LIMA to LAD, SVG to RCA, EVH via right thigh.   Dyslipidemia    Dysrhythmia    aflutter/afib   Ectatic thoracic aorta (HCC)    Esophageal stricture    GERD (gastroesophageal reflux disease)    Heart murmur    sees dr jerrald   Hyperlipidemia    Incidental pulmonary nodule, > 3mm and < 8mm 10/04/2018   Noted on CTA   Inguinal hernia    bilateral   Obesity    Presence of permanent cardiac pacemaker    Prostate cancer (HCC)    S/P aortic valve replacement with bioprosthetic valve 10/11/2018   23 mm Edwards Inspiris Resilia stented bovine pericardial tissue valve, miltral valve done also   S/P CABG x 2 10/11/2018   LIMA to LAD, SVG to RCA, EVH via right thigh   S/P mitral valve replacement with bioprosthetic valve 10/11/2018   27 mm Medtronic Mosaic stented porcine bioprosthetic tissue valve   S/P patent foramen ovale closure 10/11/2018   S/P placement of cardiac pacemaker 2020   Smoker    Stroke [facility])  ALLERGIES:  is allergic to losartan .  MEDICATIONS:  Current Outpatient Medications  Medication Sig Dispense Refill   acetaminophen  (TYLENOL ) 325 MG tablet Take 2 tablets (650 mg total) by mouth every 4 (four) hours as needed for moderate pain.     amoxicillin  (AMOXIL ) 500 MG capsule Take 1 capsule (500 mg total) by mouth 3 (three) times daily. 90 capsule 5   atorvastatin  (LIPITOR) 20 MG tablet TAKE 1 TABLET BY MOUTH EVERY DAY 90 tablet 3   cetirizine (ZYRTEC) 10 MG tablet Take 10 mg by mouth daily.     digoxin  (LANOXIN ) 0.125 MG tablet TAKE 1  TABLET BY MOUTH EVERY DAY 90 tablet 3   ELIQUIS  5 MG TABS tablet TAKE 1 TABLET BY MOUTH 2 TIMES A DAY 60 tablet 11   empagliflozin  (JARDIANCE ) 25 MG TABS tablet Take 1 tablet (25 mg total) by mouth daily before breakfast. 90 tablet 1   furosemide  (LASIX ) 20 MG tablet Take 1 tablet (20 mg total) by mouth daily. Patient is taking every other day. 90 tablet 0   HYDROcodone -acetaminophen  (NORCO/VICODIN) 5-325 MG tablet Take 1 tablet by mouth every 6 (six) hours as needed for moderate pain (pain score 4-6). 45 tablet 0   lidocaine -prilocaine  (EMLA ) cream Apply to affected area once 30 g 3   metoprolol  succinate (TOPROL -XL) 100 MG 24 hr tablet TAKE 1 TABLET BY MOUTH EVERY DAY (TAKE WITH OR IMMEDIATELY FOLLOWING A MEAL) 90 tablet 3   mirtazapine  (REMERON ) 15 MG tablet Take 1 tablet (15 mg total) by mouth at bedtime. 30 tablet 0   omeprazole  (PRILOSEC ) 20 MG capsule Take 20 mg by mouth daily.     ondansetron  (ZOFRAN ) 8 MG tablet Take 1 tablet (8 mg total) by mouth every 8 (eight) hours as needed for nausea or vomiting. 30 tablet 1   oxymetazoline (AFRIN) 0.05 % nasal spray Place 1 spray into both nostrils at bedtime as needed for congestion.     polyethylene glycol powder (GLYCOLAX /MIRALAX ) 17 GM/SCOOP powder Take 17 g by mouth daily as needed for mild constipation. 238 g 0   prochlorperazine  (COMPAZINE ) 10 MG tablet Take 1 tablet (10 mg total) by mouth every 6 (six) hours as needed for nausea or vomiting. 30 tablet 1   spironolactone  (ALDACTONE ) 25 MG tablet Take 1 tablet (25 mg total) by mouth daily. NEEDS FOLLOW UP APPOINTMENT FOR MORE REFILLS 90 tablet 0   omeprazole  (PRILOSEC  OTC) 20 MG tablet Take 1 tablet (20 mg total) by mouth daily. 90 tablet 3   ondansetron  (ZOFRAN -ODT) 4 MG disintegrating tablet Take 1 tablet (4 mg total) by mouth every 8 (eight) hours as needed for nausea or vomiting. 30 tablet 1   No current facility-administered medications for this visit.    SURGICAL HISTORY:  Past  Surgical History:  Procedure Laterality Date   AORTIC VALVE REPLACEMENT N/A 10/11/2018   Procedure: AORTIC VALVE REPLACEMENT (AVR) with 23 Inspiris Bioprosthetic Aortic valve.;  Surgeon: Dusty Sudie DEL, MD;  Location: [facility] OR;  Service: Open Heart Surgery;  Laterality: N/A;   AV FISTULA PLACEMENT Left 06/15/2022   Procedure: LEFT UPPER EXTREMITY THROMBECTOMY;  Surgeon: Sheree Penne Bruckner, MD;  Location: [facility] OR;  Service: Vascular;  Laterality: Left;   BIOPSY  03/22/2023   Procedure: BIOPSY;  Surgeon: Elicia Claw, MD;  Location: [facility] ENDOSCOPY;  Service: Gastroenterology;;   CLIPPING OF ATRIAL APPENDAGE N/A 10/11/2018   Procedure: CLIPPING OF LEFT ATRIAL APPENDAGE with 45 AtriCure Clip.;  Surgeon: Dusty Sudie DEL, MD;  Location: [facility]  OR;  Service: Open Heart Surgery;  Laterality: N/A;   COLONOSCOPY  03/2008,2004   Dr. Dyane   COLONOSCOPY WITH PROPOFOL  N/A 03/22/2023   Procedure: COLONOSCOPY WITH PROPOFOL ;  Surgeon: Elicia Claw, MD;  Location: [facility] ENDOSCOPY;  Service: Gastroenterology;  Laterality: N/A;   CORONARY ARTERY BYPASS GRAFT N/A 10/11/2018   Procedure: CORONARY ARTERY BYPASS GRAFTING (CABG) x 2 using LIMA to the LAD and endoscopic greater saphenous vein harvesting to the RCA.;  Surgeon: Dusty Sudie DEL, MD;  Location: [facility] OR;  Service: Open Heart Surgery;  Laterality: N/A;   CYSTOSCOPY  04/04/2019   Procedure: CYSTOSCOPY;  Surgeon: Devere Lonni Righter, MD;  Location: [facility];  Service: Urology;;   ESOPHAGOGASTRODUODENOSCOPY (EGD) WITH PROPOFOL  N/A 03/22/2023   Procedure: ESOPHAGOGASTRODUODENOSCOPY (EGD) WITH PROPOFOL ;  Surgeon: Elicia Claw, MD;  Location: [facility] ENDOSCOPY;  Service: Gastroenterology;  Laterality: N/A;   HERNIA REPAIR  12/11/10   BIH   IR IMAGING GUIDED PORT INSERTION  10/13/2023   MITRAL VALVE REPLACEMENT N/A 10/11/2018   Procedure: MITRAL VALVE (MV) REPLACEMENT with 27 Mosaic Bioprosthetic Mitral valve.;  Surgeon: Dusty Sudie DEL, MD;   Location: [facility] OR;  Service: Open Heart Surgery;  Laterality: N/A;   PACEMAKER IMPLANT N/A 10/17/2018   Procedure: PACEMAKER IMPLANT;  Surgeon: Kelsie Agent, MD;  Location: [facility] INVASIVE CV LAB;  Service: Cardiovascular;  Laterality: N/A;   POLYPECTOMY  03/22/2023   Procedure: POLYPECTOMY;  Surgeon: Elicia Claw, MD;  Location: [facility] ENDOSCOPY;  Service: Gastroenterology;;   RADIOACTIVE SEED IMPLANT N/A 04/04/2019   Procedure: RADIOACTIVE SEED IMPLANT/BRACHYTHERAPY IMPLANT, cystoscopy;  Surgeon: Devere Lonni Righter, MD;  Location: [facility];  Service: Urology;  Laterality: N/A;  58 seeds   REPAIR OF PATENT FORAMEN OVALE N/A 10/11/2018   Procedure: CLOSURE OF PATENT FORAMEN OVALE;  Surgeon: Dusty Sudie DEL, MD;  Location: [facility] OR;  Service: Open Heart Surgery;  Laterality: N/A;   RIGHT/LEFT HEART CATH AND CORONARY ANGIOGRAPHY N/A 09/15/2018   Procedure: RIGHT/LEFT HEART CATH AND CORONARY ANGIOGRAPHY;  Surgeon: Verlin Lonni BIRCH, MD;  Location: [facility] INVASIVE CV LAB;  Service: Cardiovascular;  Laterality: N/A;   RIGHT/LEFT HEART CATH AND CORONARY/GRAFT ANGIOGRAPHY N/A 05/25/2022   Procedure: RIGHT/LEFT HEART CATH AND CORONARY/GRAFT ANGIOGRAPHY;  Surgeon: Verlin Lonni BIRCH, MD;  Location: [facility] INVASIVE CV LAB;  Service: Cardiovascular;  Laterality: N/A;   TEE WITHOUT CARDIOVERSION N/A 12/12/2015   Procedure: TRANSESOPHAGEAL ECHOCARDIOGRAM (TEE);  Surgeon: Jerel Balding, MD;  Location: [facility] ENDOSCOPY;  Service: Cardiovascular;  Laterality: N/A;   TEE WITHOUT CARDIOVERSION N/A 10/11/2018   Procedure: TRANSESOPHAGEAL ECHOCARDIOGRAM (TEE);  Surgeon: Dusty Sudie DEL, MD;  Location: [facility] OR;  Service: Open Heart Surgery;  Laterality: N/A;   TEE WITHOUT CARDIOVERSION N/A 06/16/2022   Procedure: TRANSESOPHAGEAL ECHOCARDIOGRAM;  Surgeon: Gardenia Led, DO;  Location: [facility] INVASIVE CV LAB;  Service: Cardiovascular;  Laterality: N/A;   THYMECTOMY  1976   radaiation tx done    REVIEW OF  SYSTEMS:  Constitutional: positive for fatigue Eyes: negative Ears, nose, mouth, throat, and face: negative Respiratory: positive for dyspnea on exertion Cardiovascular: negative Gastrointestinal: negative Genitourinary:negative Integument/breast: negative Hematologic/lymphatic: negative Musculoskeletal:negative Neurological: negative Behavioral/Psych: negative Endocrine: negative Allergic/Immunologic: negative   PHYSICAL EXAMINATION: General appearance: alert, cooperative, fatigued, and no distress Head: Normocephalic, without obvious abnormality, atraumatic Neck: no adenopathy, no JVD, supple, symmetrical, trachea midline, and thyroid  not enlarged, symmetric, no tenderness/mass/nodules Lymph nodes: Cervical, supraclavicular, and axillary nodes normal. Resp: diminished breath sounds RLL and RML and dullness to percussion RLL and RML  Back: symmetric, no curvature. ROM normal. No CVA tenderness. Cardio: regular rate and rhythm, S1, S2 normal, no murmur, click, rub or gallop GI: soft, non-tender; bowel sounds normal; no masses,  no organomegaly Extremities: extremities normal, atraumatic, no cyanosis or edema Neurologic: Alert and oriented X 3, normal strength and tone. Normal symmetric reflexes. Normal coordination and gait  ECOG PERFORMANCE STATUS: 1 - Symptomatic but completely ambulatory  Blood pressure 138/78, pulse 100, temperature (!) 97.5 F (36.4 C), resp. rate 17, height 5' 7 (1.702 m), weight 171 lb (77.6 kg), SpO2 96%.  LABORATORY DATA: Lab Results  Component Value Date   WBC 7.5 10/27/2023   HGB 13.4 10/27/2023   HCT 42.8 10/27/2023   MCV 76.4 (L) 10/27/2023   PLT 213 10/27/2023      Chemistry      Component Value Date/Time   NA 139 10/17/2023 1034   NA 133 (L) 08/30/2023 1007   K 3.9 10/17/2023 1034   CL 104 10/17/2023 1034   CO2 27 10/17/2023 1034   BUN 17 10/17/2023 1034   BUN 19 08/30/2023 1007   CREATININE 0.71 10/17/2023 1034   CREATININE 1.10  08/09/2023 1455      Component Value Date/Time   CALCIUM  9.0 10/17/2023 1034   ALKPHOS 125 10/17/2023 1034   AST 14 (L) 10/17/2023 1034   ALT 7 10/17/2023 1034   BILITOT 1.1 10/17/2023 1034       RADIOGRAPHIC STUDIES: DG Chest 2 View Result Date: 10/26/2023 EXAM: 2 VIEW(S) XRAY OF THE CHEST 10/26/2023 12:19:00 PM COMPARISON: 10/05/2023 CLINICAL HISTORY: Cough with yellow mucous production, hx mesothelioma, Mesothelioma (pleural) (HCC), Shortness of breath FINDINGS: LINES, TUBES AND DEVICES: Stable dual lumen right IJ port catheter in place. HEART AND MEDIASTINUM: Prior CABG noted. Stable pacemaker, sternotomy wires, and cardiac valve noted. LUNGS AND PLEURA: Similar moderate to large right pleural effusion, possibly partially loculated. Similar right lung opacities. BONES AND SOFT TISSUES: Similar thoracic compression deformities. IMPRESSION: 1. Similar moderate to large right pleural effusion, possibly partially loculated, and right lung opacities, stable compared to 10/05/2023. Electronically signed by: Waddell Calk MD 10/26/2023 04:09 PM EDT RP Workstation: [id number]   IR IMAGING GUIDED PORT INSERTION Result Date: 10/13/2023 CLINICAL DATA:  Right-sided stage III malignant pleural mesothelioma. EXAM: Chest port catheter placement TECHNIQUE: Procedure performed using fluoroscopy and ultrasound CONTRAST:  None RADIOPHARMACEUTICALS:  None FLUOROSCOPY: 35.3 mGy COMPARISON:  None FINDINGS: The patient was placed in supine position on the IR gantry and the right upper chest and neck were prepped and draped in the usual sterile fashion. The nurse administered intravenous fentanyl  and Versed  under my supervision and the nurse had no other injuries other than monitoring the patient and administering medications. I was present for the entire duration of procedure. 1 mg intravenous Versed  and 75 mcg intravenous fentanyl  were administered for a total sedation time of 54 minutes. Ultrasound guidance was used  to investigate the right internal jugular vein which was anechoic and compressible indicating patency, however the vein was diminutive in appears to significantly decrease caliber and I determine if it was patent into the mediastinum. Given this appearance right external jugular vein was used for access. Ultrasound demonstrated the external jugular vein to be anechoic and compressible. The left internal jugular vein was not used as the patient has a pacemaker on the left side. The needle was then advanced from a scan negative through the soft tissue into the right external jugular vein under ultrasound guidance. A final image was obtained and  stored in the patient's permanent medical record. Access was then exchanged over a guidewire which was advanced under fluoroscopic guidance. The needle was removed and replaced with a micropuncture sheath. Approximately 2 inches below the clavicle the port pocket was created with a subsequent incision. The catheter was then tunneled from the port pocket to the venotomy site overlying the right internal jugular vein. Access was then exchanged over an 035 guidewire for peel-away sheath which was advanced over the guidewire under fluoroscopic guidance. The catheter was then advanced through the peel-away sheath to the cavoatrial junction. Sheath was removed. The catheter was then cut at the port pocket and connected to chest port. The chest port was tested for function and finally function well. The chest port was then flushed with heparin  and a port pocket was closed with 4-0 suture. Final image was obtained demonstrating satisfactory position of chest port. The final count of all materials was satisfactory. IMPRESSION: 1. Satisfactory placement of right internal jugular vein single-lumen chest port. The tip of the is at cavoatrial junction. 2.  Okay to use and power inject chest port. Electronically Signed   By: Cordella Banner   On: 10/13/2023 14:44   DG Chest Port 1  View Result Date: 10/06/2023 EXAM: 1 VIEW XRAY OF THE CHEST 10/05/2023 01:10:00 PM COMPARISON: 04/09/2023, recent PET/CT CLINICAL HISTORY: S/P biopsy [id number]. S/p right lung biopsy. FINDINGS: LUNGS AND PLEURA: Low lung volumes. No pneumothorax. Moderate right pleural effusion, partially loculated. Patchy airspace opacities in right lung. Linear subsegmental atelectasis in left lung. HEART AND MEDIASTINUM: Prior median sternotomy with prosthetic cardiac valve. Left-sided pacemaker in place. Aortic atherosclerosis. BONES AND SOFT TISSUES: No acute osseous abnormality. IMPRESSION: 1. No pneumothorax post right lung biopsy. 2. Partially loculated right pleural effusion with right lung opacities. Electronically signed by: Andrea Gasman MD 10/06/2023 09:23 AM EDT RP Workstation: [id number]   CT LUNG MASS BIOPSY Result Date: 10/05/2023 INDICATION: 73 year old with diffuse right pleural thickening and suspicious for a neoplastic or metastatic process. Tissue diagnosis is needed. EXAM: CT-GUIDED BIOPSY OF RIGHT PLEURAL THICKENING TECHNIQUE: Multidetector CT imaging of the chest was performed following the standard protocol without IV contrast. RADIATION DOSE REDUCTION: This exam was performed according to the departmental dose-optimization program which includes automated exposure control, adjustment of the mA and/or kV according to patient size and/or use of iterative reconstruction technique. MEDICATIONS: Moderate sedation ANESTHESIA/SEDATION: Moderate (conscious) sedation was employed during this procedure. A total of Versed  2 mg and Fentanyl  100 mcg was administered intravenously by the radiology nurse. Total intra-service moderate Sedation Time: 19 minutes. The patient's level of consciousness and vital signs were monitored continuously by radiology nursing throughout the procedure under my direct supervision. COMPLICATIONS: None immediate. PROCEDURE: Informed written consent was obtained from the patient after a  thorough discussion of the procedural risks, benefits and alternatives. All questions were addressed. A timeout was performed prior to the initiation of the procedure. Patient was placed prone and CT images of the chest were obtained. Pleural thickening along the posterolateral aspect of the right chest was targeted. Posterior right lower chest was prepped with chlorhexidine  and sterile field was created. Skin was anesthetized using 1% lidocaine . An small incision was made. Using CT guidance, 17 gauge coaxial needle was directed into the posterolateral right pleural thickening. Needle position was confirmed with CT. Total of 3 core 18 gauge biopsies were performed. Two adequate specimens obtained. 17 gauge needle was removed without complication. Follow up CT images were obtained. Bandage placed over the puncture  site. FINDINGS: Diffuse right pleural thickening. Small amount of right pleural fluid. Needle position confirmed within the posterolateral right pleural thickening. Two adequate specimens obtained. No pneumothorax on the post biopsy images. IMPRESSION: CT-guided core biopsies of the right pleural thickening. Electronically Signed   By: Juliene Balder M.D.   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  He is currently on systemic immunotherapy with ipilimumab  1 Mg/KG every 6 weeks in addition to nivolumab  360 Mg IV every 3 weeks.  Status post day 1 of cycle #1. Assessment and Plan Assessment & Plan Stage 3 right malignant pleural mesothelioma with malignant pleural effusion Stage 3 right malignant pleural mesothelioma diagnosed in August 2025. Currently on systemic immunotherapy with ibrumumab and nivolumab , post day one of cycle one. Persistent malignant  pleural effusion on the right side causing significant restriction of the right lung. The goal of treatment is to prevent the spread to the left lung and reduce pleural effusion to improve breathing. No significant adverse effects from immunotherapy noted. Hemoglobin is normal at 13.4, ruling out anemia as a cause of fatigue. - Continue systemic immunotherapy with ibrumumab and nivolumab  - Consider drainage of pleural effusion if symptoms worsen  Fatigue and dyspnea due to malignancy Fatigue and dyspnea are persistent and likely due to the malignant pleural effusion and mesothelioma. Fatigue is not attributed to anemia as hemoglobin levels are normal. Dyspnea is exacerbated by the pleural effusion restricting the right lung. - Consider pleural effusion drainage if dyspnea worsens He was advised to call immediately if he has any concerning symptoms in the interval. The patient voices understanding of current disease status and treatment options and is in agreement with the current care plan.  All questions were answered. The patient knows to call the clinic with any problems, questions or concerns. We can certainly see the patient much sooner if necessary.  The total time spent in the appointment was 30 minutes including review of chart and various tests results, discussions about plan of care and coordination of care plan .   Disclaimer: This note was dictated with voice recognition software. Similar sounding words can inadvertently be transcribed and may not be corrected upon review.

## 2023-10-29 ENCOUNTER — Other Ambulatory Visit: Payer: Self-pay | Admitting: Physician Assistant

## 2023-11-08 ENCOUNTER — Other Ambulatory Visit (HOSPITAL_COMMUNITY): Payer: Self-pay | Admitting: Cardiology

## 2023-11-10 ENCOUNTER — Inpatient Hospital Stay

## 2023-11-10 ENCOUNTER — Inpatient Hospital Stay: Attending: Physician Assistant | Admitting: Internal Medicine

## 2023-11-10 ENCOUNTER — Inpatient Hospital Stay: Admitting: Nutrition

## 2023-11-10 VITALS — BP 120/76 | HR 110 | Temp 98.0°F | Ht 67.0 in | Wt 169.0 lb

## 2023-11-10 VITALS — BP 101/69 | HR 100 | Temp 97.8°F | Resp 20

## 2023-11-10 DIAGNOSIS — Z8546 Personal history of malignant neoplasm of prostate: Secondary | ICD-10-CM | POA: Insufficient documentation

## 2023-11-10 DIAGNOSIS — Z800 Family history of malignant neoplasm of digestive organs: Secondary | ICD-10-CM | POA: Insufficient documentation

## 2023-11-10 DIAGNOSIS — Z87891 Personal history of nicotine dependence: Secondary | ICD-10-CM | POA: Insufficient documentation

## 2023-11-10 DIAGNOSIS — C450 Mesothelioma of pleura: Secondary | ICD-10-CM

## 2023-11-10 DIAGNOSIS — Z950 Presence of cardiac pacemaker: Secondary | ICD-10-CM | POA: Diagnosis not present

## 2023-11-10 DIAGNOSIS — Z953 Presence of xenogenic heart valve: Secondary | ICD-10-CM | POA: Insufficient documentation

## 2023-11-10 DIAGNOSIS — Z5112 Encounter for antineoplastic immunotherapy: Secondary | ICD-10-CM | POA: Insufficient documentation

## 2023-11-10 DIAGNOSIS — Z801 Family history of malignant neoplasm of trachea, bronchus and lung: Secondary | ICD-10-CM | POA: Diagnosis not present

## 2023-11-10 DIAGNOSIS — C778 Secondary and unspecified malignant neoplasm of lymph nodes of multiple regions: Secondary | ICD-10-CM | POA: Diagnosis not present

## 2023-11-10 DIAGNOSIS — Z951 Presence of aortocoronary bypass graft: Secondary | ICD-10-CM | POA: Diagnosis not present

## 2023-11-10 DIAGNOSIS — Z79620 Long term (current) use of immunosuppressive biologic: Secondary | ICD-10-CM | POA: Insufficient documentation

## 2023-11-10 LAB — CBC WITH DIFFERENTIAL (CANCER CENTER ONLY)
Abs Immature Granulocytes: 0.05 K/uL (ref 0.00–0.07)
Basophils Absolute: 0.1 K/uL (ref 0.0–0.1)
Basophils Relative: 1 %
Eosinophils Absolute: 0.1 K/uL (ref 0.0–0.5)
Eosinophils Relative: 2 %
HCT: 45.2 % (ref 39.0–52.0)
Hemoglobin: 13.9 g/dL (ref 13.0–17.0)
Immature Granulocytes: 1 %
Lymphocytes Relative: 9 %
Lymphs Abs: 0.7 K/uL (ref 0.7–4.0)
MCH: 23.5 pg — ABNORMAL LOW (ref 26.0–34.0)
MCHC: 30.8 g/dL (ref 30.0–36.0)
MCV: 76.5 fL — ABNORMAL LOW (ref 80.0–100.0)
Monocytes Absolute: 0.9 K/uL (ref 0.1–1.0)
Monocytes Relative: 11 %
Neutro Abs: 6 K/uL (ref 1.7–7.7)
Neutrophils Relative %: 76 %
Platelet Count: 224 K/uL (ref 150–400)
RBC: 5.91 MIL/uL — ABNORMAL HIGH (ref 4.22–5.81)
RDW: 21.2 % — ABNORMAL HIGH (ref 11.5–15.5)
WBC Count: 7.8 K/uL (ref 4.0–10.5)
nRBC: 0 % (ref 0.0–0.2)

## 2023-11-10 LAB — CMP (CANCER CENTER ONLY)
ALT: 7 U/L (ref 0–44)
AST: 15 U/L (ref 15–41)
Albumin: 2.7 g/dL — ABNORMAL LOW (ref 3.5–5.0)
Alkaline Phosphatase: 126 U/L (ref 38–126)
Anion gap: 6 (ref 5–15)
BUN: 13 mg/dL (ref 8–23)
CO2: 28 mmol/L (ref 22–32)
Calcium: 8.9 mg/dL (ref 8.9–10.3)
Chloride: 104 mmol/L (ref 98–111)
Creatinine: 0.66 mg/dL (ref 0.61–1.24)
GFR, Estimated: >60 mL/min (ref >60)
Glucose, Bld: 100 mg/dL — ABNORMAL HIGH (ref 70–99)
Potassium: 3.7 mmol/L (ref 3.5–5.1)
Sodium: 138 mmol/L (ref 135–145)
Total Bilirubin: 1.3 mg/dL — ABNORMAL HIGH (ref 0.0–1.2)
Total Protein: 7.3 g/dL (ref 6.5–8.1)

## 2023-11-10 MED ORDER — SODIUM CHLORIDE 0.9 % IV SOLN
360.0000 mg | Freq: Once | INTRAVENOUS | Status: AC
  Administered 2023-11-10: 360 mg via INTRAVENOUS
  Filled 2023-11-10: qty 12

## 2023-11-10 MED ORDER — SODIUM CHLORIDE 0.9% FLUSH
10.0000 mL | INTRAVENOUS | Status: DC | PRN
  Administered 2023-11-10: 10 mL

## 2023-11-10 MED ORDER — SODIUM CHLORIDE 0.9 % IV SOLN: INTRAVENOUS | Status: DC

## 2023-11-10 NOTE — Progress Notes (Signed)
 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  He is followed by Dr. Sherrod.  CURRENT THERAPY: Systemic immunotherapy with ipilimumab  1 Mg/KG every 6 weeks in addition to nivolumab  360 Mg IV every 3 weeks.  Status post day 1 of cycle #1.   Past medical history includes CAD, dyslipidemia, esophageal stricture, GERD, obesity, permanent pacemaker, prostate cancer, CABG x 2, and stroke.  Medications include Jardiance , Lasix , Remeron , Prilosec , Zofran , MiraLAX , Compazine .  Labs include glucose 100 and albumin  2.7.  Height: 5 feet 7 inches. Weight: 169 pounds October 2 Usual body weight: Approximately 200 pounds 1 year ago BMI: 26.47.  Spoke with patient's wife who states patient has lost a lot of weight.  He used to weigh 240 pounds.  This was consistent with a weight documented on October 18, 2018 of 243 pounds 9.7 ounces.  Patient had increased fatigue after the last treatment and spent a lot of time in bed as well as sleeping more.  He needed to use a walker to get around as he was very weak.  He has minimal nausea.  Reports eating 2 eggs, toast with gravy, and oatmeal with a banana for breakfast.  Lunch typically is a couple crackers or a bite of something else.  For dinner patient eats 3-4 ounces of meat with some vegetables.  He may have a fourth sub sandwich.  Sometimes in the evening he will have frozen yogurt with a Premier protein mixed in to increase calories and protein.  Denies vomiting, constipation, and diarrhea.  He does not have difficulty chewing or swallowing.  Nutrition diagnosis:  Unintended weight loss related to cancer and associated treatments as evidenced by 18% weight loss in less than 6 months which is clinically significant.  Intervention: Continue small frequent meals and snacks  utilizing higher calorie, higher protein foods. Switch to Ensure complete to provide 350 cal and 30 g protein.  Suggested providing 1 at lunchtime with a small bite of food.  Continue to mix into frozen yogurt at bedtime as tolerated.  Monitoring, evaluation, goals: Patient will tolerate increased calories and protein to minimize weight loss.  Next visit: Thursday, October 23 during infusion with Elvie.  **Disclaimer: This note was dictated with voice recognition software. Similar sounding words can inadvertently be transcribed and this note may contain transcription errors which may not have been corrected upon publication of note.**

## 2023-11-10 NOTE — Progress Notes (Signed)
 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   PRIOR THERAPY: None  CURRENT THERAPY: Systemic immunotherapy with ipilimumab  1 Mg/KG every 6 weeks in addition to nivolumab  360 Mg IV every 3 weeks.  Status post day 1 of cycle #1.  INTERVAL HISTORY: [name] 73 y.o. male returns to the clinic today for follow-up visit. Discussed the use of AI scribe software for clinical note transcription with the patient, who gave verbal consent to proceed.  History of Present Illness [name] is a 73 year old male with stage three right malignant pleural mesothelioma who presents for day twenty-two of cycle one of systemic immunotherapy. He is accompanied by his wife.  He was diagnosed with stage three right malignant pleural mesothelioma in August 2025 and is currently undergoing systemic immunotherapy with ipilimumab  and nivolumab . He is on day twenty-two of cycle one of his treatment regimen.  In the past two weeks, he has experienced significant fatigue, particularly in the first week where he slept a lot. In the second week, his energy levels slightly improved, and he slept a little less.  He experienced a fall on Monday, resulting in back pain after hitting a coffee table. He attributes the fall to a loss of balance. Since the fall, he has been using a walker consistently. His sister was present during the incident. He has been taking pain medication, one dose at night, and occasionally an additional dose if needed, which has helped him sleep well and manage the pain.  He has had two softer bowel movements during the period but no vomiting or rash. His wife notes  that he had a really good day recently. No significant diarrhea.    MEDICAL HISTORY: Past Medical History:  Diagnosis Date   BCE (basal cell epithelioma), arm    RIGHT SHOULDER   Bradycardia    Bumps on skin    on left side of nose for last 3 weeks   CAD (coronary artery disease)    a. s/p CABGx2 10/11/18 LIMA to LAD, SVG to RCA, EVH via right thigh.   Dyslipidemia    Dysrhythmia    aflutter/afib   Ectatic thoracic aorta    Esophageal stricture    GERD (gastroesophageal reflux disease)    Heart murmur    sees dr jerrald   Hyperlipidemia    Incidental pulmonary nodule, > 3mm and < 8mm 10/04/2018   Noted on CTA   Inguinal hernia    bilateral   Obesity    Presence of permanent cardiac pacemaker    Prostate cancer (HCC)    S/P aortic valve replacement with bioprosthetic valve 10/11/2018   23 mm Edwards Inspiris Resilia stented bovine pericardial tissue valve, miltral valve done also   S/P CABG x 2 10/11/2018   LIMA to LAD, SVG to RCA, EVH via right thigh   S/P mitral valve replacement with bioprosthetic valve 10/11/2018   27 mm Medtronic Mosaic stented porcine bioprosthetic tissue valve   S/P patent foramen ovale closure 10/11/2018   S/P placement of cardiac pacemaker 2020   Smoker    Stroke [facility])     ALLERGIES:  is allergic to losartan .  MEDICATIONS:  Current Outpatient Medications  Medication Sig Dispense  Refill   acetaminophen  (TYLENOL ) 325 MG tablet Take 2 tablets (650 mg total) by mouth every 4 (four) hours as needed for moderate pain.     amoxicillin  (AMOXIL ) 500 MG capsule Take 1 capsule (500 mg total) by mouth 3 (three) times daily. 90 capsule 5   atorvastatin  (LIPITOR) 20 MG tablet TAKE 1 TABLET BY MOUTH EVERY DAY 90 tablet 3   cetirizine (ZYRTEC) 10 MG tablet Take 10 mg by mouth daily.     digoxin  (LANOXIN ) 0.125 MG tablet TAKE 1 TABLET BY MOUTH EVERY DAY 90 tablet 3   ELIQUIS  5 MG TABS tablet TAKE 1 TABLET BY MOUTH 2 TIMES A DAY 60 tablet 11   empagliflozin   (JARDIANCE ) 25 MG TABS tablet Take 1 tablet (25 mg total) by mouth daily before breakfast. 90 tablet 1   furosemide  (LASIX ) 20 MG tablet Take 1 tablet (20 mg total) by mouth daily. Patient is taking every other day. 90 tablet 0   HYDROcodone -acetaminophen  (NORCO/VICODIN) 5-325 MG tablet Take 1 tablet by mouth every 6 (six) hours as needed for moderate pain (pain score 4-6). 45 tablet 0   lidocaine -prilocaine  (EMLA ) cream Apply to affected area once 30 g 3   metoprolol  succinate (TOPROL -XL) 100 MG 24 hr tablet TAKE 1 TABLET BY MOUTH EVERY DAY (TAKE WITH OR IMMEDIATELY FOLLOWING A MEAL) 90 tablet 3   mirtazapine  (REMERON ) 15 MG tablet TAKE ONE TABLET BY MOUTH AT BEDTIME 30 tablet 0   omeprazole  (PRILOSEC  OTC) 20 MG tablet Take 1 tablet (20 mg total) by mouth daily. 90 tablet 3   omeprazole  (PRILOSEC ) 20 MG capsule Take 20 mg by mouth daily.     ondansetron  (ZOFRAN ) 8 MG tablet Take 1 tablet (8 mg total) by mouth every 8 (eight) hours as needed for nausea or vomiting. 30 tablet 1   ondansetron  (ZOFRAN -ODT) 4 MG disintegrating tablet Take 1 tablet (4 mg total) by mouth every 8 (eight) hours as needed for nausea or vomiting. 30 tablet 1   oxymetazoline (AFRIN) 0.05 % nasal spray Place 1 spray into both nostrils at bedtime as needed for congestion.     polyethylene glycol powder (GLYCOLAX /MIRALAX ) 17 GM/SCOOP powder Take 17 g by mouth daily as needed for mild constipation. 238 g 0   prochlorperazine  (COMPAZINE ) 10 MG tablet Take 1 tablet (10 mg total) by mouth every 6 (six) hours as needed for nausea or vomiting. 30 tablet 1   spironolactone  (ALDACTONE ) 25 MG tablet TAKE 1 TABLET BY MOUTH EVERY DAY 90 tablet 0   No current facility-administered medications for this visit.    SURGICAL HISTORY:  Past Surgical History:  Procedure Laterality Date   AORTIC VALVE REPLACEMENT N/A 10/11/2018   Procedure: AORTIC VALVE REPLACEMENT (AVR) with 23 Inspiris Bioprosthetic Aortic valve.;  Surgeon: Dusty Sudie DEL,  MD;  Location: [facility] OR;  Service: Open Heart Surgery;  Laterality: N/A;   AV FISTULA PLACEMENT Left 06/15/2022   Procedure: LEFT UPPER EXTREMITY THROMBECTOMY;  Surgeon: Sheree Penne Bruckner, MD;  Location: [facility] OR;  Service: Vascular;  Laterality: Left;   BIOPSY  03/22/2023   Procedure: BIOPSY;  Surgeon: Elicia Claw, MD;  Location: [facility];  Service: Gastroenterology;;   CLIPPING OF ATRIAL APPENDAGE N/A 10/11/2018   Procedure: CLIPPING OF LEFT ATRIAL APPENDAGE with 45 AtriCure Clip.;  Surgeon: Dusty Sudie DEL, MD;  Location: [facility] OR;  Service: Open Heart Surgery;  Laterality: N/A;   COLONOSCOPY  03/2008,2004   Dr. Dyane   COLONOSCOPY WITH PROPOFOL  N/A 03/22/2023   Procedure:  COLONOSCOPY WITH PROPOFOL ;  Surgeon: Elicia Claw, MD;  Location: [facility];  Service: Gastroenterology;  Laterality: N/A;   CORONARY ARTERY BYPASS GRAFT N/A 10/11/2018   Procedure: CORONARY ARTERY BYPASS GRAFTING (CABG) x 2 using LIMA to the LAD and endoscopic greater saphenous vein harvesting to the RCA.;  Surgeon: Dusty Sudie DEL, MD;  Location: [facility] OR;  Service: Open Heart Surgery;  Laterality: N/A;   CYSTOSCOPY  04/04/2019   Procedure: CYSTOSCOPY;  Surgeon: Devere Lonni Righter, MD;  Location: [facility];  Service: Urology;;   ESOPHAGOGASTRODUODENOSCOPY (EGD) WITH PROPOFOL  N/A 03/22/2023   Procedure: ESOPHAGOGASTRODUODENOSCOPY (EGD) WITH PROPOFOL ;  Surgeon: Elicia Claw, MD;  Location: [facility];  Service: Gastroenterology;  Laterality: N/A;   HERNIA REPAIR  12/11/10   BIH   IR IMAGING GUIDED PORT INSERTION  10/13/2023   MITRAL VALVE REPLACEMENT N/A 10/11/2018   Procedure: MITRAL VALVE (MV) REPLACEMENT with 27 Mosaic Bioprosthetic Mitral valve.;  Surgeon: Dusty Sudie DEL, MD;  Location: [facility] OR;  Service: Open Heart Surgery;  Laterality: N/A;   PACEMAKER IMPLANT N/A 10/17/2018   Procedure: PACEMAKER IMPLANT;  Surgeon: Kelsie Agent, MD;  Location: [facility];  Service:  Cardiovascular;  Laterality: N/A;   POLYPECTOMY  03/22/2023   Procedure: POLYPECTOMY;  Surgeon: Elicia Claw, MD;  Location: [facility];  Service: Gastroenterology;;   RADIOACTIVE SEED IMPLANT N/A 04/04/2019   Procedure: RADIOACTIVE SEED IMPLANT/BRACHYTHERAPY IMPLANT, cystoscopy;  Surgeon: Devere Lonni Righter, MD;  Location: [facility];  Service: Urology;  Laterality: N/A;  58 seeds   REPAIR OF PATENT FORAMEN OVALE N/A 10/11/2018   Procedure: CLOSURE OF PATENT FORAMEN OVALE;  Surgeon: Dusty Sudie DEL, MD;  Location: [facility] OR;  Service: Open Heart Surgery;  Laterality: N/A;   RIGHT/LEFT HEART CATH AND CORONARY ANGIOGRAPHY N/A 09/15/2018   Procedure: RIGHT/LEFT HEART CATH AND CORONARY ANGIOGRAPHY;  Surgeon: Verlin Lonni BIRCH, MD;  Location: [facility];  Service: Cardiovascular;  Laterality: N/A;   RIGHT/LEFT HEART CATH AND CORONARY/GRAFT ANGIOGRAPHY N/A 05/25/2022   Procedure: RIGHT/LEFT HEART CATH AND CORONARY/GRAFT ANGIOGRAPHY;  Surgeon: Verlin Lonni BIRCH, MD;  Location: [facility];  Service: Cardiovascular;  Laterality: N/A;   TEE WITHOUT CARDIOVERSION N/A 12/12/2015   Procedure: TRANSESOPHAGEAL ECHOCARDIOGRAM (TEE);  Surgeon: Jerel Balding, MD;  Location: [facility] ENDOSCOPY;  Service: Cardiovascular;  Laterality: N/A;   TEE WITHOUT CARDIOVERSION N/A 10/11/2018   Procedure: TRANSESOPHAGEAL ECHOCARDIOGRAM (TEE);  Surgeon: Dusty Sudie DEL, MD;  Location: [facility] OR;  Service: Open Heart Surgery;  Laterality: N/A;   TEE WITHOUT CARDIOVERSION N/A 06/16/2022   Procedure: TRANSESOPHAGEAL ECHOCARDIOGRAM;  Surgeon: Gardenia Led, DO;  Location: [facility];  Service: Cardiovascular;  Laterality: N/A;   THYMECTOMY  1976   radaiation tx done    REVIEW OF SYSTEMS:  A comprehensive review of systems was negative except for: Constitutional: positive for fatigue Musculoskeletal: positive for arthralgias and muscle weakness   PHYSICAL EXAMINATION: General  appearance: alert, cooperative, fatigued, and no distress Head: Normocephalic, without obvious abnormality, atraumatic Neck: no adenopathy, no JVD, supple, symmetrical, trachea midline, and thyroid  not enlarged, symmetric, no tenderness/mass/nodules Lymph nodes: Cervical, supraclavicular, and axillary nodes normal. Resp: diminished breath sounds RLL and RML and dullness to percussion RLL and RML Back: symmetric, no curvature. ROM normal. No CVA tenderness. Cardio: regular rate and rhythm, S1, S2 normal, no murmur, click, rub or gallop GI: soft, non-tender; bowel sounds normal; no masses,  no organomegaly Extremities: extremities normal, atraumatic, no cyanosis or edema  ECOG PERFORMANCE  STATUS: 1 - Symptomatic but completely ambulatory  Blood pressure 120/76, pulse (!) 110, temperature 98 F (36.7 C), height 5' 7 (1.702 m), weight 169 lb (76.7 kg).  LABORATORY DATA: Lab Results  Component Value Date   WBC 7.5 10/27/2023   HGB 13.4 10/27/2023   HCT 42.8 10/27/2023   MCV 76.4 (L) 10/27/2023   PLT 213 10/27/2023      Chemistry      Component Value Date/Time   NA 140 10/27/2023 0923   NA 133 (L) 08/30/2023 1007   K 3.7 10/27/2023 0923   CL 107 10/27/2023 0923   CO2 26 10/27/2023 0923   BUN 19 10/27/2023 0923   BUN 19 08/30/2023 1007   CREATININE 0.78 10/27/2023 0923   CREATININE 1.10 08/09/2023 1455      Component Value Date/Time   CALCIUM  8.5 (L) 10/27/2023 0923   ALKPHOS 125 10/27/2023 0923   AST 17 10/27/2023 0923   ALT 6 10/27/2023 0923   BILITOT 1.2 10/27/2023 0923       RADIOGRAPHIC STUDIES: DG Chest 2 View Result Date: 10/26/2023 EXAM: 2 VIEW(S) XRAY OF THE CHEST 10/26/2023 12:19:00 PM COMPARISON: 10/05/2023 CLINICAL HISTORY: Cough with yellow mucous production, hx mesothelioma, Mesothelioma (pleural) (HCC), Shortness of breath FINDINGS: LINES, TUBES AND DEVICES: Stable dual lumen right IJ port catheter in place. HEART AND MEDIASTINUM: Prior CABG noted. Stable  pacemaker, sternotomy wires, and cardiac valve noted. LUNGS AND PLEURA: Similar moderate to large right pleural effusion, possibly partially loculated. Similar right lung opacities. BONES AND SOFT TISSUES: Similar thoracic compression deformities. IMPRESSION: 1. Similar moderate to large right pleural effusion, possibly partially loculated, and right lung opacities, stable compared to 10/05/2023. Electronically signed by: Waddell Calk MD 10/26/2023 04:09 PM EDT RP Workstation: [id number]   IR IMAGING GUIDED PORT INSERTION Result Date: 10/13/2023 CLINICAL DATA:  Right-sided stage III malignant pleural mesothelioma. EXAM: Chest port catheter placement TECHNIQUE: Procedure performed using fluoroscopy and ultrasound CONTRAST:  None RADIOPHARMACEUTICALS:  None FLUOROSCOPY: 35.3 mGy COMPARISON:  None FINDINGS: The patient was placed in supine position on the IR gantry and the right upper chest and neck were prepped and draped in the usual sterile fashion. The nurse administered intravenous fentanyl  and Versed  under my supervision and the nurse had no other injuries other than monitoring the patient and administering medications. I was present for the entire duration of procedure. 1 mg intravenous Versed  and 75 mcg intravenous fentanyl  were administered for a total sedation time of 54 minutes. Ultrasound guidance was used to investigate the right internal jugular vein which was anechoic and compressible indicating patency, however the vein was diminutive in appears to significantly decrease caliber and I determine if it was patent into the mediastinum. Given this appearance right external jugular vein was used for access. Ultrasound demonstrated the external jugular vein to be anechoic and compressible. The left internal jugular vein was not used as the patient has a pacemaker on the left side. The needle was then advanced from a scan negative through the soft tissue into the right external jugular vein under ultrasound  guidance. A final image was obtained and stored in the patient's permanent medical record. Access was then exchanged over a guidewire which was advanced under fluoroscopic guidance. The needle was removed and replaced with a micropuncture sheath. Approximately 2 inches below the clavicle the port pocket was created with a subsequent incision. The catheter was then tunneled from the port pocket to the venotomy site overlying the right internal jugular vein. Access was then  exchanged over an 035 guidewire for peel-away sheath which was advanced over the guidewire under fluoroscopic guidance. The catheter was then advanced through the peel-away sheath to the cavoatrial junction. Sheath was removed. The catheter was then cut at the port pocket and connected to chest port. The chest port was tested for function and finally function well. The chest port was then flushed with heparin  and a port pocket was closed with 4-0 suture. Final image was obtained demonstrating satisfactory position of chest port. The final count of all materials was satisfactory. IMPRESSION: 1. Satisfactory placement of right internal jugular vein single-lumen chest port. The tip of the is at cavoatrial junction. 2.  Okay to use and power inject chest port. 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  He is currently on systemic immunotherapy with ipilimumab  1 Mg/KG every 6 weeks in addition to nivolumab  360 Mg IV every 3 weeks.  Status post day 1 of cycle #1.  He tolerated the first dose of his treatment fairly well except for fatigue. Assessment and Plan Assessment & Plan Malignant pleural mesothelioma, right, stage 3 Stage 3 right malignant  pleural mesothelioma, diagnosed in August 2025. Currently undergoing systemic immunotherapy with ipilimumab  and nivolumab . Status post day 22 of cycle 1. No significant adverse effects reported, such as rash or vomiting. CBC results are stable with hemoglobin at 13.9, normal white blood count, and platelets. Awaiting chemistry results. - Continue systemic immunotherapy with ipilimumab  and nivolumab .  Back pain after fall Back pain following a fall on Monday, resulting in bruising. Pain managed with medication, taken once daily and occasionally at night for severe pain. He uses a walker for stability.  Fatigue Intermittent fatigue, with improvement noted over the past two weeks. Initially slept a lot, but has been sleeping less recently, indicating some recovery of energy levels.  Soft stools Reported, with only two episodes during the last two weeks. No associated vomiting or significant gastrointestinal distress. He was advised to call immediately if he has any concerning symptoms in the interval.  The patient voices understanding of current disease status and treatment options and is in agreement with the current care plan.  All questions were answered. The patient knows to call the clinic with any problems, questions or concerns. We can certainly see the patient much sooner if necessary.  The total time spent in the appointment was 20 minutes including review of chart and various tests results, discussions about plan of care and coordination of care plan .   Disclaimer: This note was dictated with voice recognition software. Similar sounding words can inadvertently be transcribed and may not be corrected upon review.

## 2023-11-10 NOTE — Patient Instructions (Signed)
 [facility] - A DEPT OF [facility]  Discharge Instructions: Thank you for choosing [facility] to provide your oncology and hematology care.   If you have a lab appointment with the Cancer Center, please go directly to the Cancer Center and check in at the registration area.   Wear comfortable clothing and clothing appropriate for easy access to any Portacath or PICC line.   We strive to give you quality time with your provider. You may need to reschedule your appointment if you arrive late (15 or more minutes).  Arriving late affects you and other patients whose appointments are after yours.  Also, if you miss three or more appointments without notifying the office, you may be dismissed from the clinic at the provider's discretion.      For prescription refill requests, have your pharmacy contact our office and allow 72 hours for refills to be completed.    Today you received the following chemotherapy and/or immunotherapy agent: Nivolumab  (Opdivo )   To help prevent nausea and vomiting after your treatment, we encourage you to take your nausea medication as directed.  BELOW ARE SYMPTOMS THAT SHOULD BE REPORTED IMMEDIATELY: *FEVER GREATER THAN 100.4 F (38 C) OR HIGHER *CHILLS OR SWEATING *NAUSEA AND VOMITING THAT IS NOT CONTROLLED WITH YOUR NAUSEA MEDICATION *UNUSUAL SHORTNESS OF BREATH *UNUSUAL BRUISING OR BLEEDING *URINARY PROBLEMS (pain or burning when urinating, or frequent urination) *BOWEL PROBLEMS (unusual diarrhea, constipation, pain near the anus) TENDERNESS IN MOUTH AND THROAT WITH OR WITHOUT PRESENCE OF ULCERS (sore throat, sores in mouth, or a toothache) UNUSUAL RASH, SWELLING OR PAIN  UNUSUAL VAGINAL DISCHARGE OR ITCHING   Items with * indicate a potential emergency and should be followed up as soon as possible or go to the Emergency Department if any problems should occur.  Please show the CHEMOTHERAPY ALERT CARD or  IMMUNOTHERAPY ALERT CARD at check-in to the Emergency Department and triage nurse.  Should you have questions after your visit or need to cancel or reschedule your appointment, please contact [facility] - A DE[facility]  Dept: [phone]  and follow the prompts.  Office hours are 8:00 a.m. to 4:30 p.m. Monday - Friday. Please note that voicemails left after 4:00 p.m. may not be returned until the following business day.  We are closed weekends and major holidays. You have access to a nurse at all times for urgent questions. Please call the main number to the clinic Dept: [phone] and follow the prompts.   For any non-urgent questions, you may also contact your provider using MyChart. We now offer e-Visits for anyone 33 and older to request care online for non-urgent symptoms. For details visit [email or web address].   Also download the MyChart app! Go to the app store, search MyChart, open the app, select [facility], and log in with your MyChart username and password.

## 2023-11-11 ENCOUNTER — Telehealth

## 2023-11-11 ENCOUNTER — Other Ambulatory Visit: Payer: Self-pay | Admitting: Family Medicine

## 2023-11-11 DIAGNOSIS — E118 Type 2 diabetes mellitus with unspecified complications: Secondary | ICD-10-CM

## 2023-11-14 ENCOUNTER — Ambulatory Visit: Payer: Medicare HMO

## 2023-11-14 DIAGNOSIS — I480 Paroxysmal atrial fibrillation: Secondary | ICD-10-CM | POA: Diagnosis not present

## 2023-11-16 LAB — CUP PACEART REMOTE DEVICE CHECK
Battery Remaining Longevity: 40 mo
Battery Voltage: 2.94 V
Brady Statistic AP VP Percent: 0.85 %
Brady Statistic AP VS Percent: 0.01 %
Brady Statistic AS VP Percent: 91.99 %
Brady Statistic AS VS Percent: 7.14 %
Brady Statistic RA Percent Paced: 1.17 %
Brady Statistic RV Percent Paced: 92.5 %
Date Time Interrogation Session: 20251006015514
Implantable Lead Connection Status: 753985
Implantable Lead Connection Status: 753985
Implantable Lead Implant Date: 20200908
Implantable Lead Implant Date: 20200908
Implantable Lead Location: 753859
Implantable Lead Location: 753860
Implantable Lead Model: 3830
Implantable Lead Model: 5076
Implantable Pulse Generator Implant Date: 20200908
Lead Channel Impedance Value: 247 Ohm
Lead Channel Impedance Value: 304 Ohm
Lead Channel Impedance Value: 342 Ohm
Lead Channel Impedance Value: 437 Ohm
Lead Channel Pacing Threshold Amplitude: 0.625 V
Lead Channel Pacing Threshold Amplitude: 0.875 V
Lead Channel Pacing Threshold Pulse Width: 0.4 ms
Lead Channel Pacing Threshold Pulse Width: 0.4 ms
Lead Channel Sensing Intrinsic Amplitude: 0.75 mV
Lead Channel Sensing Intrinsic Amplitude: 0.75 mV
Lead Channel Sensing Intrinsic Amplitude: 8 mV
Lead Channel Sensing Intrinsic Amplitude: 8 mV
Lead Channel Setting Pacing Amplitude: 1.5 V
Lead Channel Setting Pacing Amplitude: 2.5 V
Lead Channel Setting Pacing Pulse Width: 0.8 ms
Lead Channel Setting Sensing Sensitivity: 1.2 mV
Zone Setting Status: 755011
Zone Setting Status: 755011

## 2023-11-17 NOTE — Progress Notes (Signed)
 Remote PPM Transmission

## 2023-11-21 ENCOUNTER — Ambulatory Visit: Payer: Self-pay | Admitting: Cardiology

## 2023-11-21 NOTE — Progress Notes (Signed)
 Remote PPM Transmission

## 2023-11-24 ENCOUNTER — Encounter: Admitting: Family Medicine

## 2023-11-28 ENCOUNTER — Other Ambulatory Visit: Payer: Self-pay | Admitting: Internal Medicine

## 2023-11-29 NOTE — Progress Notes (Unsigned)
 [facility] OFFICE PROGRESS NOTE  Michael Norleen BROCKS, MD [address] 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   PRIOR THERAPY: None   CURRENT THERAPY: Systemic immunotherapy with ipilimumab  1 Mg/KG every 6 weeks in addition to nivolumab  360 Mg IV every 3 weeks. Status post day 1 of cycle #1.   INTERVAL HISTORY: [name] 73 y.o. male returns to the clinic for a follow up visit. The patient is feeling well today without any concerning complaints except for ***. The patient continues to tolerate treatment with ipi/nivo well without any adverse effects except for fatigue for ***. Denies any fever, chills, night sweats, or weight loss. Denies any chest pain, shortness of breath, cough, or hemoptysis. Denies any nausea, vomiting, diarrhea, or constipation. Denies any headache or visual changes. Denies any rashes or skin changes. The patient is here today for evaluation prior to starting cycle # 2  MEDICAL HISTORY: Past Medical History:  Diagnosis Date   BCE (basal cell epithelioma), arm    RIGHT SHOULDER   Bradycardia    Bumps on skin    on left side of nose for last 3 weeks   CAD (coronary artery disease)    a. s/p CABGx2 10/11/18 LIMA to LAD, SVG to RCA, EVH via right thigh.   Dyslipidemia    Dysrhythmia    aflutter/afib   Ectatic thoracic aorta    Esophageal stricture    GERD (gastroesophageal reflux disease)    Heart murmur    sees dr jerrald   Hyperlipidemia    Incidental pulmonary nodule, > 3mm and < 8mm 10/04/2018   Noted on CTA   Inguinal hernia    bilateral   Obesity    Presence of permanent cardiac pacemaker    Prostate cancer (HCC)    S/P aortic valve replacement with bioprosthetic valve 10/11/2018   23 mm Edwards Inspiris Resilia stented bovine  pericardial tissue valve, miltral valve done also   S/P CABG x 2 10/11/2018   LIMA to LAD, SVG to RCA, EVH via right thigh   S/P mitral valve replacement with bioprosthetic valve 10/11/2018   27 mm Medtronic Mosaic stented porcine bioprosthetic tissue valve   S/P patent foramen ovale closure 10/11/2018   S/P placement of cardiac pacemaker 2020   Smoker    Stroke [facility])     ALLERGIES:  is allergic to losartan .  MEDICATIONS:  Current Outpatient Medications  Medication Sig Dispense Refill   acetaminophen  (TYLENOL ) 325 MG tablet Take 2 tablets (650 mg total) by mouth every 4 (four) hours as needed for moderate pain.     amoxicillin  (AMOXIL ) 500 MG capsule Take 1 capsule (500 mg total) by mouth 3 (three) times daily. 90 capsule 5   atorvastatin  (LIPITOR) 20 MG tablet TAKE 1 TABLET BY MOUTH EVERY DAY 90 tablet 3   cetirizine (ZYRTEC) 10 MG tablet Take 10 mg by mouth daily.     digoxin  (LANOXIN ) 0.125 MG tablet TAKE 1 TABLET BY MOUTH EVERY DAY 90 tablet 3   ELIQUIS  5 MG TABS tablet TAKE 1 TABLET BY MOUTH 2 TIMES A DAY 60 tablet 11   furosemide  (LASIX ) 20 MG tablet Take 1 tablet (20 mg total) by mouth daily. Patient is taking every other day. 90 tablet 0   HYDROcodone -acetaminophen  (NORCO/VICODIN) 5-325 MG tablet Take 1 tablet by mouth every 6 (six) hours  as needed for moderate pain (pain score 4-6). 45 tablet 0   JARDIANCE  25 MG TABS tablet TAKE 1 TABLET BY MOUTH EVERY MORNING BEFORE BREAKFAST 90 tablet 1   lidocaine -prilocaine  (EMLA ) cream Apply to affected area once 30 g 3   metoprolol  succinate (TOPROL -XL) 100 MG 24 hr tablet TAKE 1 TABLET BY MOUTH EVERY DAY (TAKE WITH OR IMMEDIATELY FOLLOWING A MEAL) 90 tablet 3   mirtazapine  (REMERON ) 15 MG tablet TAKE 1 TABLET BY MOUTH AT BEDTIME 30 tablet 0   omeprazole  (PRILOSEC  OTC) 20 MG tablet Take 1 tablet (20 mg total) by mouth daily. 90 tablet 3   omeprazole  (PRILOSEC ) 20 MG capsule Take 20 mg by mouth daily.     ondansetron  (ZOFRAN ) 8 MG tablet  Take 1 tablet (8 mg total) by mouth every 8 (eight) hours as needed for nausea or vomiting. 30 tablet 1   ondansetron  (ZOFRAN -ODT) 4 MG disintegrating tablet Take 1 tablet (4 mg total) by mouth every 8 (eight) hours as needed for nausea or vomiting. 30 tablet 1   oxymetazoline (AFRIN) 0.05 % nasal spray Place 1 spray into both nostrils at bedtime as needed for congestion.     polyethylene glycol powder (GLYCOLAX /MIRALAX ) 17 GM/SCOOP powder Take 17 g by mouth daily as needed for mild constipation. 238 g 0   prochlorperazine  (COMPAZINE ) 10 MG tablet Take 1 tablet (10 mg total) by mouth every 6 (six) hours as needed for nausea or vomiting. 30 tablet 1   spironolactone  (ALDACTONE ) 25 MG tablet TAKE 1 TABLET BY MOUTH EVERY DAY 90 tablet 0   No current facility-administered medications for this visit.    SURGICAL HISTORY:  Past Surgical History:  Procedure Laterality Date   AORTIC VALVE REPLACEMENT N/A 10/11/2018   Procedure: AORTIC VALVE REPLACEMENT (AVR) with 23 Inspiris Bioprosthetic Aortic valve.;  Surgeon: Dusty Sudie DEL, MD;  Location: [facility] OR;  Service: Open Heart Surgery;  Laterality: N/A;   AV FISTULA PLACEMENT Left 06/15/2022   Procedure: LEFT UPPER EXTREMITY THROMBECTOMY;  Surgeon: Sheree Penne Bruckner, MD;  Location: [facility] OR;  Service: Vascular;  Laterality: Left;   BIOPSY  03/22/2023   Procedure: BIOPSY;  Surgeon: Elicia Claw, MD;  [facility]: [facility];  Service: Gastroenterology;;   CLIPPING OF ATRIAL APPENDAGE N/A 10/11/2018   Procedure: CLIPPING OF LEFT ATRIAL APPENDAGE with 45 AtriCure Clip.;  Surgeon: Dusty Sudie DEL, MD;  Location: [facility] OR;  Service: Open Heart Surgery;  Laterality: N/A;   COLONOSCOPY  03/2008,2004   Dr. Dyane   COLONOSCOPY WITH PROPOFOL  N/A 03/22/2023   Procedure: COLONOSCOPY WITH PROPOFOL ;  Surgeon: Elicia Claw, MD;  [facility]: [facility];  Service: Gastroenterology;  Laterality: N/A;   CORONARY ARTERY BYPASS GRAFT N/A 10/11/2018   Procedure: CORONARY  ARTERY BYPASS GRAFTING (CABG) x 2 using LIMA to the LAD and endoscopic greater saphenous vein harvesting to the RCA.;  Surgeon: Dusty Sudie DEL, MD;  Location: [facility] OR;  Service: Open Heart Surgery;  Laterality: N/A;   CYSTOSCOPY  04/04/2019   Procedure: CYSTOSCOPY;  Surgeon: Devere Bruckner Righter, MD;  Location: [facility];  Service: Urology;;   ESOPHAGOGASTRODUODENOSCOPY (EGD) WITH PROPOFOL  N/A 03/22/2023   Procedure: ESOPHAGOGASTRODUODENOSCOPY (EGD) WITH PROPOFOL ;  Surgeon: Elicia Claw, MD;  [facility];  Service: Gastroenterology;  Laterality: N/A;   HERNIA REPAIR  12/11/10   BIH   IR IMAGING GUIDED PORT INSERTION  10/13/2023   MITRAL VALVE REPLACEMENT N/A 10/11/2018   Procedure: MITRAL VALVE (MV) REPLACEMENT with 27 Mosaic Bioprosthetic Mitral valve.;  Surgeon: Dusty Sudie DEL, MD;  Location: [facility] OR;  Service: Open Heart Surgery;  Laterality: N/A;   PACEMAKER IMPLANT N/A 10/17/2018   Procedure: PACEMAKER IMPLANT;  Surgeon: Kelsie Agent, MD;  [facility]: [facility];  Service: Cardiovascular;  Laterality: N/A;   POLYPECTOMY  03/22/2023   Procedure: POLYPECTOMY;  Surgeon: Elicia Claw, MD;  [facility];  Service: Gastroenterology;;   RADIOACTIVE SEED IMPLANT N/A 04/04/2019   Procedure: RADIOACTIVE SEED IMPLANT/BRACHYTHERAPY IMPLANT, cystoscopy;  Surgeon: Devere Lonni Righter, MD;  Location: [facility];  Service: Urology;  Laterality: N/A;  58 seeds   REPAIR OF PATENT FORAMEN OVALE N/A 10/11/2018   Procedure: CLOSURE OF PATENT FORAMEN OVALE;  Surgeon: Dusty Sudie DEL, MD;  Location: [facility] OR;  Service: Open Heart Surgery;  Laterality: N/A;   RIGHT/LEFT HEART CATH AND CORONARY ANGIOGRAPHY N/A 09/15/2018   Procedure: RIGHT/LEFT HEART CATH AND CORONARY ANGIOGRAPHY;  Surgeon: Verlin Lonni BIRCH, MD;  [facility]: [facility];  Service: Cardiovascular;  Laterality: N/A;   RIGHT/LEFT HEART CATH AND CORONARY/GRAFT ANGIOGRAPHY N/A  05/25/2022   Procedure: RIGHT/LEFT HEART CATH AND CORONARY/GRAFT ANGIOGRAPHY;  Surgeon: Verlin Lonni BIRCH, MD;  [facility];  Service: Cardiovascular;  Laterality: N/A;   TEE WITHOUT CARDIOVERSION N/A 12/12/2015   Procedure: TRANSESOPHAGEAL ECHOCARDIOGRAM (TEE);  Surgeon: Jerel Balding, MD;  Location: [facility] ENDOSCOPY;  Service: Cardiovascular;  Laterality: N/A;   TEE WITHOUT CARDIOVERSION N/A 10/11/2018   Procedure: TRANSESOPHAGEAL ECHOCARDIOGRAM (TEE);  Surgeon: Dusty Sudie DEL, MD;  Location: [facility] OR;  Service: Open Heart Surgery;  Laterality: N/A;   TEE WITHOUT CARDIOVERSION N/A 06/16/2022   Procedure: TRANSESOPHAGEAL ECHOCARDIOGRAM;  Surgeon: Gardenia Led, DO;  [facility]: [facility];  Service: Cardiovascular;  Laterality: N/A;   THYMECTOMY  1976   radaiation tx done    REVIEW OF SYSTEMS:   Review of Systems  Constitutional: Negative for appetite change, chills, fatigue, fever and unexpected weight change.  HENT:   Negative for mouth sores, nosebleeds, sore throat and trouble swallowing.   Eyes: Negative for eye problems and icterus.  Respiratory: Negative for cough, hemoptysis, shortness of breath and wheezing.   Cardiovascular: Negative for chest pain and leg swelling.  Gastrointestinal: Negative for abdominal pain, constipation, diarrhea, nausea and vomiting.  Genitourinary: Negative for bladder incontinence, difficulty urinating, dysuria, frequency and hematuria.   Musculoskeletal: Negative for back pain, gait problem, neck pain and neck stiffness.  Skin: Negative for itching and rash.  Neurological: Negative for dizziness, extremity weakness, gait problem, headaches, light-headedness and seizures.  Hematological: Negative for adenopathy. Does not bruise/bleed easily.  Psychiatric/Behavioral: Negative for confusion, depression and sleep disturbance. The patient is not nervous/anxious.     PHYSICAL EXAMINATION:  There were no vitals taken for this  visit.  ECOG PERFORMANCE STATUS: {CHL ONC ECOG [id number]  Physical Exam  Constitutional: Oriented to person, place, and time and well-developed, well-nourished, and in no distress. No distress.  HENT:  Head: Normocephalic and atraumatic.  Mouth/Throat: Oropharynx is clear and moist. No oropharyngeal exudate.  Eyes: Conjunctivae are normal. Right eye exhibits no discharge. Left eye exhibits no discharge. No scleral icterus.  Neck: Normal range of motion. Neck supple.  Cardiovascular: Normal rate, regular rhythm, normal heart sounds and intact distal pulses.   Pulmonary/Chest: Effort normal and breath sounds normal. No respiratory distress. No wheezes. No rales.  Abdominal: Soft. Bowel sounds are normal. Exhibits no distension and no mass. There is no tenderness.  Musculoskeletal: Normal range of motion. Exhibits no edema.  Lymphadenopathy:    No cervical adenopathy.  Neurological: Alert and oriented to person, place, and time. Exhibits normal muscle tone. Gait normal. Coordination normal.  Skin: Skin is warm and dry. No rash noted. Not diaphoretic. No erythema. No pallor.  Psychiatric: Mood, memory and judgment normal.  Vitals reviewed.  LABORATORY DATA: Lab Results  Component Value Date   WBC 7.8 11/10/2023   HGB 13.9 11/10/2023   HCT 45.2 11/10/2023   MCV 76.5 (L) 11/10/2023   PLT 224 11/10/2023      Chemistry      Component Value Date/Time   NA 138 11/10/2023 1050   NA 133 (L) 08/30/2023 1007   K 3.7 11/10/2023 1050   CL 104 11/10/2023 1050   CO2 28 11/10/2023 1050   BUN 13 11/10/2023 1050   BUN 19 08/30/2023 1007   CREATININE 0.66 11/10/2023 1050   CREATININE 1.10 08/09/2023 1455      Component Value Date/Time   CALCIUM  8.9 11/10/2023 1050   ALKPHOS 126 11/10/2023 1050   AST 15 11/10/2023 1050   ALT 7 11/10/2023 1050   BILITOT 1.3 (H) 11/10/2023 1050       RADIOGRAPHIC STUDIES:  CUP PACEART REMOTE DEVICE CHECK Result Date: 11/16/2023 PPM Scheduled  remote reviewed. Normal device function.  Presenting rhythm:  AS-VP.  5 AHR detections, total burden 2.3%, longest 50 hours 32 minutes, EGMs consistent with AF. Increase in AF burden in September. History of AF, on Eliquis  per Epic. 1 VHR detection consistent with 5 beat NSVT. Forwarding to triage for increase in AF from previous. Next remote transmission per protocol. 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 He was diagnosed August 2025.    He is currently on systemic immunotherapy with ipilimumab  1 Mg/KG every 6 weeks in addition to nivolumab  360 Mg IV every 3 weeks.  Status post day 1 of cycle #1.  He tolerated the first dose of his treatment fairly well except for fatigue.   Labs were reviewed. Recommend that he *** with cycle #2 today as scheduled.   We will see him back in 3 weeks before undergoing cycle #3.   The patient was advised to call immediately if he has any concerning symptoms in the interval. The patient voices understanding of current disease status and treatment options and is in agreement with the current care plan. All questions were answered. The patient knows to call the clinic with any problems, questions or concerns. We can certainly see the patient much sooner if necessary      No orders of the defined types were placed in this encounter.    I spent {CHL ONC TIME VISIT - DTPQU:[id number]} counseling the patient face to face. The total time spent in the appointment was {CHL ONC TIME VISIT - DTPQU:[id number]}.  Alexyss Balzarini L Ulysee Fyock, PA-C 11/29/23

## 2023-12-01 ENCOUNTER — Encounter: Payer: Self-pay | Admitting: Nurse Practitioner

## 2023-12-01 ENCOUNTER — Inpatient Hospital Stay: Admitting: Dietician

## 2023-12-01 ENCOUNTER — Other Ambulatory Visit: Payer: Self-pay | Admitting: Nurse Practitioner

## 2023-12-01 ENCOUNTER — Inpatient Hospital Stay

## 2023-12-01 ENCOUNTER — Inpatient Hospital Stay (HOSPITAL_BASED_OUTPATIENT_CLINIC_OR_DEPARTMENT_OTHER): Admitting: Nurse Practitioner

## 2023-12-01 ENCOUNTER — Inpatient Hospital Stay: Admitting: Physician Assistant

## 2023-12-01 VITALS — BP 105/72 | HR 97 | Temp 98.0°F | Resp 17 | Ht 67.0 in | Wt 163.8 lb

## 2023-12-01 DIAGNOSIS — E876 Hypokalemia: Secondary | ICD-10-CM

## 2023-12-01 DIAGNOSIS — C450 Mesothelioma of pleura: Secondary | ICD-10-CM

## 2023-12-01 DIAGNOSIS — K5903 Drug induced constipation: Secondary | ICD-10-CM | POA: Diagnosis not present

## 2023-12-01 DIAGNOSIS — Z79620 Long term (current) use of immunosuppressive biologic: Secondary | ICD-10-CM | POA: Diagnosis not present

## 2023-12-01 DIAGNOSIS — R530 Neoplastic (malignant) related fatigue: Secondary | ICD-10-CM

## 2023-12-01 DIAGNOSIS — J90 Pleural effusion, not elsewhere classified: Secondary | ICD-10-CM | POA: Insufficient documentation

## 2023-12-01 DIAGNOSIS — G893 Neoplasm related pain (acute) (chronic): Secondary | ICD-10-CM

## 2023-12-01 DIAGNOSIS — Z515 Encounter for palliative care: Secondary | ICD-10-CM | POA: Diagnosis not present

## 2023-12-01 DIAGNOSIS — Z5112 Encounter for antineoplastic immunotherapy: Secondary | ICD-10-CM | POA: Diagnosis not present

## 2023-12-01 DIAGNOSIS — R0789 Other chest pain: Secondary | ICD-10-CM

## 2023-12-01 LAB — CBC WITH DIFFERENTIAL (CANCER CENTER ONLY)
Abs Immature Granulocytes: 0.03 K/uL (ref 0.00–0.07)
Basophils Absolute: 0.1 K/uL (ref 0.0–0.1)
Basophils Relative: 1 %
Eosinophils Absolute: 0.2 K/uL (ref 0.0–0.5)
Eosinophils Relative: 3 %
HCT: 45.2 % (ref 39.0–52.0)
Hemoglobin: 14.3 g/dL (ref 13.0–17.0)
Immature Granulocytes: 1 %
Lymphocytes Relative: 9 %
Lymphs Abs: 0.6 K/uL — ABNORMAL LOW (ref 0.7–4.0)
MCH: 24.6 pg — ABNORMAL LOW (ref 26.0–34.0)
MCHC: 31.6 g/dL (ref 30.0–36.0)
MCV: 77.8 fL — ABNORMAL LOW (ref 80.0–100.0)
Monocytes Absolute: 0.7 K/uL (ref 0.1–1.0)
Monocytes Relative: 11 %
Neutro Abs: 5 K/uL (ref 1.7–7.7)
Neutrophils Relative %: 75 %
Platelet Count: 195 K/uL (ref 150–400)
RBC: 5.81 MIL/uL (ref 4.22–5.81)
RDW: 23.2 % — ABNORMAL HIGH (ref 11.5–15.5)
WBC Count: 6.6 K/uL (ref 4.0–10.5)
nRBC: 0 % (ref 0.0–0.2)

## 2023-12-01 LAB — CMP (CANCER CENTER ONLY)
ALT: 9 U/L (ref 0–44)
AST: 16 U/L (ref 15–41)
Albumin: 2.8 g/dL — ABNORMAL LOW (ref 3.5–5.0)
Alkaline Phosphatase: 143 U/L — ABNORMAL HIGH (ref 38–126)
Anion gap: 7 (ref 5–15)
BUN: 12 mg/dL (ref 8–23)
CO2: 31 mmol/L (ref 22–32)
Calcium: 8.5 mg/dL — ABNORMAL LOW (ref 8.9–10.3)
Chloride: 101 mmol/L (ref 98–111)
Creatinine: 0.73 mg/dL (ref 0.61–1.24)
GFR, Estimated: >60 mL/min (ref >60)
Glucose, Bld: 123 mg/dL — ABNORMAL HIGH (ref 70–99)
Potassium: 3.3 mmol/L — ABNORMAL LOW (ref 3.5–5.1)
Sodium: 139 mmol/L (ref 135–145)
Total Bilirubin: 1.5 mg/dL — ABNORMAL HIGH (ref 0.0–1.2)
Total Protein: 6.8 g/dL (ref 6.5–8.1)

## 2023-12-01 LAB — TSH: TSH: 4.62 u[IU]/mL — ABNORMAL HIGH (ref 0.350–4.500)

## 2023-12-01 MED ORDER — FAMOTIDINE IN NACL 20-0.9 MG/50ML-% IV SOLN
20.0000 mg | Freq: Once | INTRAVENOUS | Status: AC
  Administered 2023-12-01: 20 mg via INTRAVENOUS
  Filled 2023-12-01: qty 50

## 2023-12-01 MED ORDER — SODIUM CHLORIDE 0.9 % IV SOLN
360.0000 mg | Freq: Once | INTRAVENOUS | Status: AC
  Administered 2023-12-01: 360 mg via INTRAVENOUS
  Filled 2023-12-01: qty 12

## 2023-12-01 MED ORDER — SODIUM CHLORIDE 0.9 % IV SOLN
1.0000 mg/kg | Freq: Once | INTRAVENOUS | Status: AC
  Administered 2023-12-01: 80 mg via INTRAVENOUS
  Filled 2023-12-01: qty 16

## 2023-12-01 MED ORDER — CETIRIZINE HCL 10 MG/ML IV SOLN
10.0000 mg | Freq: Once | INTRAVENOUS | Status: AC
  Administered 2023-12-01: 10 mg via INTRAVENOUS
  Filled 2023-12-01: qty 1

## 2023-12-01 MED ORDER — SODIUM CHLORIDE 0.9 % IV SOLN: INTRAVENOUS | Status: DC

## 2023-12-01 MED ORDER — POTASSIUM CHLORIDE CRYS ER 20 MEQ PO TBCR
20.0000 meq | EXTENDED_RELEASE_TABLET | Freq: Once | ORAL | Status: AC
  Administered 2023-12-01: 20 meq via ORAL
  Filled 2023-12-01: qty 1

## 2023-12-01 NOTE — Progress Notes (Signed)
 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 Receiving opdivo  + ipilimumab  under the care of Dr. Sherrod.   Met with patient and wife in infusion. Patient reports improved appetite. He is eating 3 good meals and will occasionally have yogurt/Ensure shake in the evenings. Had oatmeal with heavy cream, banana, and honey for breakfast, soup and salad for lunch. Ate half of country style steak, potatoes, cabbage for dinner. Patient reports altered taste. Finding sweets way too sweet most of the time. He reports SOB with intake. Drinking coffee out of a tea cup b/c a mug takes his breath away. Patient scheduled for a thoracentesis tomorrow (10/24).   Medications: reviewed   Labs: K 3.3, glucose 123, albumin  2.8, bilirubin 1.5  Anthropometrics: Wt 163 lb 12.8 oz today decreased   10/2 - 169 lb  9/17 - 171 lb 12.8 oz  8/27 - 181 lb 9.6 oz   NUTRITION DIAGNOSIS: Unintended wt loss - ongoing, addressing with ONS   INTERVENTION:  Discussed soft moist high protein foods for ease of intake  Discussed strategies for altered taste Encourage high calorie high protein snacks  Suggested trying sherbet mixed with vanilla ensure given current sensitivity to sweets Suggested adding ensure to coffee vs creamer Discussed strategies for adding calories/protein to foods (adding gravy/sauces/butter/cheese, replacing water with whole milk with condensed soups/oatmeal)  MONITORING, EVALUATION, GOAL: wt trends, intake    NEXT VISIT: Thursday November 13 during infusion

## 2023-12-01 NOTE — Patient Instructions (Signed)
 [facility] - A DEPT OF [facility]  Discharge Instructions: Thank you for choosing [facility] to provide your oncology and hematology care.   If you have a lab appointment with the Cancer Center, please go directly to the Cancer Center and check in at the registration area.   Wear comfortable clothing and clothing appropriate for easy access to any Portacath or PICC line.   We strive to give you quality time with your provider. You may need to reschedule your appointment if you arrive late (15 or more minutes).  Arriving late affects you and other patients whose appointments are after yours.  Also, if you miss three or more appointments without notifying the office, you may be dismissed from the clinic at the provider's discretion.      For prescription refill requests, have your pharmacy contact our office and allow 72 hours for refills to be completed.    Today you received the following chemotherapy and/or immunotherapy agents: Nivolumab  (Opdivo ) & Ipilimumab  (Yervoy )    To help prevent nausea and vomiting after your treatment, we encourage you to take your nausea medication as directed.  BELOW ARE SYMPTOMS THAT SHOULD BE REPORTED IMMEDIATELY: *FEVER GREATER THAN 100.4 F (38 C) OR HIGHER *CHILLS OR SWEATING *NAUSEA AND VOMITING THAT IS NOT CONTROLLED WITH YOUR NAUSEA MEDICATION *UNUSUAL SHORTNESS OF BREATH *UNUSUAL BRUISING OR BLEEDING *URINARY PROBLEMS (pain or burning when urinating, or frequent urination) *BOWEL PROBLEMS (unusual diarrhea, constipation, pain near the anus) TENDERNESS IN MOUTH AND THROAT WITH OR WITHOUT PRESENCE OF ULCERS (sore throat, sores in mouth, or a toothache) UNUSUAL RASH, SWELLING OR PAIN  UNUSUAL VAGINAL DISCHARGE OR ITCHING   Items with * indicate a potential emergency and should be followed up as soon as possible or go to the Emergency Department if any problems should occur.  Please show the CHEMOTHERAPY  ALERT CARD or IMMUNOTHERAPY ALERT CARD at check-in to the Emergency Department and triage nurse.  Should you have questions after your visit or need to cancel or reschedule your appointment, please contact [facility] - A DEPT [facility]  Dept: [phone]  and follow the prompts.  Office hours are 8:00 a.m. to 4:30 p.m. Monday - Friday. Please note that voicemails left after 4:00 p.m. may not be returned until the following business day.  We are closed weekends and major holidays. You have access to a nurse at all times for urgent questions. Please call the main number to the clinic Dept: [phone] and follow the prompts.   For any non-urgent questions, you may also contact your provider using MyChart. We now offer e-Visits for anyone 76 and older to request care online for non-urgent symptoms. For details visit [email or web address].   Also download the MyChart app! Go to the app store, search MyChart, open the app, select [place], and log in with your MyChart username and password.

## 2023-12-01 NOTE — Progress Notes (Signed)
 Ipilimumab  (YERVOY ) Patient Monitoring Assessment  Is the patient experiencing any of the following general symptoms?:  [ ] Difficulty performing normal activities [ ] Feeling sluggish or cold all the time [ ] Unusual weight gain [ ] Constant or unusual headaches [ ] Feeling dizzy or faint [ ] Changes in eyesight (blurry vision, double vision, or other vision problems) [ ] Changes in mood or behavior (ex: decreased sex drive, irritability, or forgetfulness) [ ] Starting new medications (ex: steroids, other medications that lower immune response) [ ] Patient is not experiencing any of the general symptoms above.   Gastrointestinal  Patient is having 1 bowel movements each day.  Is this different from baseline? [ ] Yes [X ]No Are your stools watery or do they have a foul smell? [ ] Yes [ X]No Have you seen blood in your stools? [ ] Yes [X ]No Are your stools dark, tarry, or sticky? [ ] Yes [X ]No Are you having pain or tenderness in your belly? [ ] Yes [X ]No  Skin Does your skin itch? [ ] Yes [ X]No Do you have a rash? [ ] Yes [X ]No Has your skin blistered and/or peeled? [ ] Yes [X ]No Do you have sores in your mouth? [ ] Yes [ X]No  Hepatic Has your urine been dark or tea colored? [ ] Yes [X ]No Have you noticed that your skin or the whites of your eyes are turning yellow? [ ] Yes [X] No Are you bleeding or bruising more easily than normal? [ ] Yes [ X]No Are you nauseous and/or vomiting? [ ] Yes [ X]No Do you have pain on the right side of your stomach? [ ] Yes [ X]No  Neurologic  Are you having unusual weakness of legs, arms, or face? [ ] Yes [ X]No Are you having numbness or tingling in your hands or feet? [ ] Yes [X ]No  [name]

## 2023-12-01 NOTE — Progress Notes (Unsigned)
 Palliative Medicine [name]  [phone] [phone]) [phone]   Name: [name] Date: 12/01/2023 MRN: [id number]  DOB: Jul 09, 1950  Patient Care Team: [name], MD as PCP - General (Family Medicine) Verlin Lonni BIRCH, MD as PCP - Cardiology (Cardiology) Cindie Ole DASEN, MD as PCP - Electrophysiology (Cardiology) Devere Lonni Righter, MD as Consulting Physician (Urology) Grayce Buddle, RN Nurse Navigator as Registered Nurse (Medical Oncology) Patrcia Cough, MD as Consulting Physician (Radiation Oncology) Lionell Jon DEL, [facility] (Pharmacist) Morgan Clayborne CROME, RN as VBCI Care Management Pickenpack-Cousar, Fannie SAILOR, NP as Nurse Practitioner (Hospice and Palliative Medicine) Silvano Valrie SQUIBB, RN as Registered Nurse    INTERVAL HISTORY: [name] is a 73 y.o. 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 Patient to start on palliative immunotherapy. Palliative is seeing patient for symptom management and goals of care.   SOCIAL HISTORY:     reports that he has quit smoking. His smoking use included cigarettes. He started smoking about 45 years ago. He has a 14 pack-year smoking history. He has never been exposed to tobacco smoke. He has never used smokeless tobacco. He reports current alcohol use of about 1.0 standard drink of alcohol per week. He reports that he does not use drugs.  ADVANCE DIRECTIVES:  None on file. Education provided.   CODE STATUS: Full code  PAST MEDICAL HISTORY: Past Medical History:  Diagnosis Date   BCE (basal cell epithelioma), arm    RIGHT SHOULDER   Bradycardia    Bumps on skin    on left side of nose for last 3 weeks   CAD (coronary artery disease)    a. s/p CABGx2 10/11/18 LIMA to LAD, SVG to RCA, EVH via right thigh.   Dyslipidemia    Dysrhythmia    aflutter/afib    Ectatic thoracic aorta    Esophageal stricture    GERD (gastroesophageal reflux disease)    Heart murmur    sees dr jerrald   Hyperlipidemia    Incidental pulmonary nodule, > 3mm and < 8mm 10/04/2018   Noted on CTA   Inguinal hernia    bilateral   Obesity    Presence of permanent cardiac pacemaker    Prostate cancer (HCC)    S/P aortic valve replacement with bioprosthetic valve 10/11/2018   23 mm Edwards Inspiris Resilia stented bovine pericardial tissue valve, miltral valve done also   S/P CABG x 2 10/11/2018   LIMA to LAD, SVG to RCA, EVH via right thigh   S/P mitral valve replacement with bioprosthetic valve 10/11/2018   27 mm Medtronic Mosaic stented porcine bioprosthetic tissue valve   S/P patent foramen ovale closure 10/11/2018   S/P placement of cardiac pacemaker 2020   Smoker    Stroke [facility] [place])     ALLERGIES:  is allergic to losartan .  MEDICATIONS:  Current Outpatient Medications  Medication Sig Dispense Refill   acetaminophen  (TYLENOL ) 325 MG tablet Take 2 tablets (650 mg total) by mouth every 4 (four) hours as needed for moderate pain.     amoxicillin  (AMOXIL ) 500 MG capsule Take 1 capsule (500 mg total) by mouth 3 (three) times daily. 90 capsule 5   atorvastatin  (LIPITOR) 20 MG tablet TAKE 1 TABLET BY MOUTH EVERY DAY 90 tablet 3   cetirizine (ZYRTEC) 10 MG tablet Take 10 mg by mouth daily.     digoxin  (LANOXIN ) 0.125 MG tablet  TAKE 1 TABLET BY MOUTH EVERY DAY 90 tablet 3   ELIQUIS  5 MG TABS tablet TAKE 1 TABLET BY MOUTH 2 TIMES A DAY 60 tablet 11   furosemide  (LASIX ) 20 MG tablet Take 1 tablet (20 mg total) by mouth daily. Patient is taking every other day. 90 tablet 0   HYDROcodone -acetaminophen  (NORCO/VICODIN) 5-325 MG tablet Take 1 tablet by mouth every 6 (six) hours as needed for moderate pain (pain score 4-6). 60 tablet 0   JARDIANCE  25 MG TABS tablet TAKE 1 TABLET BY MOUTH EVERY MORNING BEFORE BREAKFAST 90 tablet 1   lidocaine -prilocaine  (EMLA ) cream Apply to  affected area once 30 g 3   metoprolol  succinate (TOPROL -XL) 100 MG 24 hr tablet TAKE 1 TABLET BY MOUTH EVERY DAY (TAKE WITH OR IMMEDIATELY FOLLOWING A MEAL) 90 tablet 3   mirtazapine  (REMERON ) 15 MG tablet TAKE 1 TABLET BY MOUTH AT BEDTIME 30 tablet 0   omeprazole  (PRILOSEC  OTC) 20 MG tablet Take 1 tablet (20 mg total) by mouth daily. 90 tablet 3   omeprazole  (PRILOSEC ) 20 MG capsule Take 20 mg by mouth daily.     ondansetron  (ZOFRAN ) 8 MG tablet Take 1 tablet (8 mg total) by mouth every 8 (eight) hours as needed for nausea or vomiting. 30 tablet 1   ondansetron  (ZOFRAN -ODT) 4 MG disintegrating tablet Take 1 tablet (4 mg total) by mouth every 8 (eight) hours as needed for nausea or vomiting. 30 tablet 1   oxymetazoline (AFRIN) 0.05 % nasal spray Place 1 spray into both nostrils at bedtime as needed for congestion.     polyethylene glycol powder (GLYCOLAX /MIRALAX ) 17 GM/SCOOP powder Take 17 g by mouth daily as needed for mild constipation. 238 g 0   prochlorperazine  (COMPAZINE ) 10 MG tablet Take 1 tablet (10 mg total) by mouth every 6 (six) hours as needed for nausea or vomiting. 30 tablet 1   spironolactone  (ALDACTONE ) 25 MG tablet TAKE 1 TABLET BY MOUTH EVERY DAY 90 tablet 0   No current facility-administered medications for this visit.   Facility-Administered Medications Ordered in Other Visits  Medication Dose Route Frequency Provider Last Rate Last Admin   0.9 %  sodium chloride  infusion   Intravenous Continuous Sherrod Sherrod, MD   Stopped at 12/01/23 1430    VITAL SIGNS: There were no vitals taken for this visit. There were no vitals filed for this visit.  Estimated body mass index is 25.65 kg/m as calculated from the following:   Height as of an earlier encounter on 12/01/23: 5' 7 (1.702 m).   Weight as of an earlier encounter on 12/01/23: 163 lb 12.8 oz (74.3 kg).   PERFORMANCE STATUS (ECOG) : 1 - Symptomatic but completely ambulatory  Physical Exam General:  NAD Cardiovascular: regular rate and rhythm Pulmonary: normal breathing pattern Extremities: no edema, no joint deformities Skin: no rashes Neurological: AAO x3  IMPRESSION: Discussed the use of AI scribe software for clinical note transcription with the patient, who gave verbal consent to proceed.  History of Present Illness KELCY BAETEN is a 73 year old male with mesothelioma who was seen during infusion for symptom management follow-up. No acute distress noted. Reports occasional fatigue however tries to remain active. No concerns for uncontrolled nausea, vomiting, constipation, or diarrhea.   Appetite fluctuates. Some days are better than others. Weight continues to drop down to 163lbs from 169lbs on 10/2 and 171lbs on 9/18. We will continue to closely monitor. Encouraged small frequent meals and snacks between.   We  discussed his pain at length. Mr. Czerwinski reports his pain is controlled on current regimen. He is taking hydrocodone  5/325mg  every 6 hours as needed. No adjustments to regimen at this time.   We will continue to support and follow. All questions answered.   Goals of Care 10/26/23: We discussed his current illness and what it means in the larger context of his on-going co-morbidities. Natural disease trajectory and expectations were discussed.   Mr. Haik and his wife are realistic in their understanding of his noncurative cancer. Per notations from Oncology prognosis of 15-21months. He is remaining hopeful at this time while taking things one day at a time. Speaks to plans of initiation immunotherapy.    Patient and his wife are clear in expressed wishes to continue to treat the treatable allowing him every opportunity to thrive.    I empathetically approached discussions regarding advanced directives, code status, and healthcare limitations. He acknowledges he does not have documents in place however if he is unable to speak for himself or his wife Clarita would be his medical  decision-maker.  Patient did not engage further in goals of care discussions.  Education provided on advance directive clinic including dates and ability to schedule as he desires.   I discussed the importance of continued conversation with family and their medical providers regarding overall plan of care and treatment options, ensuring decisions are within the context of the patients values and GOCs. Assessment & Plan Cancer Related Pain Pain well controlled on current regimen.  -Continue hydrocodone  5/325mg  every 6 hours as needed  Constipation Mild constipation with no current use of stool softeners.  I will plan to follow-up with patient in 3-4 weeks. Sooner if needed.    Patient expressed understanding and was in agreement with this plan. He also understands that He can call the clinic at any time with any questions, concerns, or complaints.   Any controlled substances utilized were prescribed in the context of palliative care. PDMP has been reviewed.   Visit consisted of counseling and education dealing with the complex and emotionally intense issues of symptom management and palliative care in the setting of serious and potentially life-threatening illness.  Levon Borer, AGPCNP-BC  Palliative Medicine Team/[facility]

## 2023-12-02 ENCOUNTER — Encounter: Payer: Self-pay | Admitting: Internal Medicine

## 2023-12-02 ENCOUNTER — Ambulatory Visit (HOSPITAL_COMMUNITY)
Admission: RE | Admit: 2023-12-02 | Discharge: 2023-12-02 | Disposition: A | Discharge status: Home / Self Care | Source: Ambulatory Visit | Attending: Physician Assistant | Admitting: Physician Assistant

## 2023-12-02 ENCOUNTER — Ambulatory Visit (HOSPITAL_COMMUNITY)
Admission: RE | Admit: 2023-12-02 | Discharge: 2023-12-02 | Disposition: A | Discharge status: Home / Self Care | Source: Ambulatory Visit | Attending: Radiology | Admitting: Radiology

## 2023-12-02 VITALS — BP 108/73

## 2023-12-02 DIAGNOSIS — R918 Other nonspecific abnormal finding of lung field: Secondary | ICD-10-CM | POA: Diagnosis not present

## 2023-12-02 DIAGNOSIS — C450 Mesothelioma of pleura: Secondary | ICD-10-CM

## 2023-12-02 DIAGNOSIS — Z85831 Personal history of malignant neoplasm of soft tissue: Secondary | ICD-10-CM | POA: Diagnosis not present

## 2023-12-02 DIAGNOSIS — J90 Pleural effusion, not elsewhere classified: Secondary | ICD-10-CM | POA: Insufficient documentation

## 2023-12-02 DIAGNOSIS — Z950 Presence of cardiac pacemaker: Secondary | ICD-10-CM | POA: Diagnosis not present

## 2023-12-02 LAB — T4: T4, Total: 5.9 ug/dL (ref 4.5–12.0)

## 2023-12-02 MED ORDER — LIDOCAINE-EPINEPHRINE 1 %-1:100000 IJ SOLN
INTRAMUSCULAR | Status: AC
  Filled 2023-12-02: qty 1

## 2023-12-02 MED ORDER — LIDOCAINE-EPINEPHRINE 1 %-1:100000 IJ SOLN
20.0000 mL | Freq: Once | INTRAMUSCULAR | Status: AC
  Administered 2023-12-02: 9 mL via INTRADERMAL

## 2023-12-05 ENCOUNTER — Other Ambulatory Visit: Payer: Self-pay | Admitting: Internal Medicine

## 2023-12-05 DIAGNOSIS — C450 Mesothelioma of pleura: Secondary | ICD-10-CM

## 2023-12-15 ENCOUNTER — Inpatient Hospital Stay (HOSPITAL_COMMUNITY)
Admission: EM | Admit: 2023-12-15 | Discharge: 2023-12-17 | DRG: 180 | Disposition: A | Discharge status: Hospice | Attending: Internal Medicine | Admitting: Internal Medicine

## 2023-12-15 ENCOUNTER — Emergency Department (HOSPITAL_COMMUNITY)

## 2023-12-15 ENCOUNTER — Telehealth: Payer: Self-pay

## 2023-12-15 ENCOUNTER — Encounter: Payer: Self-pay | Admitting: Internal Medicine

## 2023-12-15 ENCOUNTER — Other Ambulatory Visit: Payer: Self-pay

## 2023-12-15 ENCOUNTER — Encounter (HOSPITAL_COMMUNITY): Payer: Self-pay

## 2023-12-15 DIAGNOSIS — Z8673 Personal history of transient ischemic attack (TIA), and cerebral infarction without residual deficits: Secondary | ICD-10-CM | POA: Diagnosis not present

## 2023-12-15 DIAGNOSIS — I110 Hypertensive heart disease with heart failure: Secondary | ICD-10-CM | POA: Diagnosis present

## 2023-12-15 DIAGNOSIS — C459 Mesothelioma, unspecified: Secondary | ICD-10-CM | POA: Diagnosis present

## 2023-12-15 DIAGNOSIS — I4892 Unspecified atrial flutter: Secondary | ICD-10-CM | POA: Diagnosis present

## 2023-12-15 DIAGNOSIS — Z953 Presence of xenogenic heart valve: Secondary | ICD-10-CM

## 2023-12-15 DIAGNOSIS — Z7189 Other specified counseling: Secondary | ICD-10-CM | POA: Diagnosis not present

## 2023-12-15 DIAGNOSIS — C7951 Secondary malignant neoplasm of bone: Secondary | ICD-10-CM | POA: Diagnosis present

## 2023-12-15 DIAGNOSIS — Z79899 Other long term (current) drug therapy: Secondary | ICD-10-CM | POA: Diagnosis not present

## 2023-12-15 DIAGNOSIS — Z7901 Long term (current) use of anticoagulants: Secondary | ICD-10-CM

## 2023-12-15 DIAGNOSIS — I480 Paroxysmal atrial fibrillation: Secondary | ICD-10-CM | POA: Diagnosis present

## 2023-12-15 DIAGNOSIS — C450 Mesothelioma of pleura: Secondary | ICD-10-CM | POA: Diagnosis present

## 2023-12-15 DIAGNOSIS — Z85828 Personal history of other malignant neoplasm of skin: Secondary | ICD-10-CM | POA: Diagnosis not present

## 2023-12-15 DIAGNOSIS — J189 Pneumonia, unspecified organism: Secondary | ICD-10-CM | POA: Diagnosis not present

## 2023-12-15 DIAGNOSIS — R1310 Dysphagia, unspecified: Secondary | ICD-10-CM | POA: Diagnosis present

## 2023-12-15 DIAGNOSIS — C3490 Malignant neoplasm of unspecified part of unspecified bronchus or lung: Secondary | ICD-10-CM | POA: Diagnosis not present

## 2023-12-15 DIAGNOSIS — J9621 Acute and chronic respiratory failure with hypoxia: Secondary | ICD-10-CM | POA: Diagnosis present

## 2023-12-15 DIAGNOSIS — Z8546 Personal history of malignant neoplasm of prostate: Secondary | ICD-10-CM

## 2023-12-15 DIAGNOSIS — E785 Hyperlipidemia, unspecified: Secondary | ICD-10-CM | POA: Diagnosis present

## 2023-12-15 DIAGNOSIS — Z711 Person with feared health complaint in whom no diagnosis is made: Secondary | ICD-10-CM | POA: Diagnosis not present

## 2023-12-15 DIAGNOSIS — Z77090 Contact with and (suspected) exposure to asbestos: Secondary | ICD-10-CM | POA: Diagnosis present

## 2023-12-15 DIAGNOSIS — E119 Type 2 diabetes mellitus without complications: Secondary | ICD-10-CM | POA: Diagnosis present

## 2023-12-15 DIAGNOSIS — Z789 Other specified health status: Secondary | ICD-10-CM | POA: Diagnosis not present

## 2023-12-15 DIAGNOSIS — Z950 Presence of cardiac pacemaker: Secondary | ICD-10-CM | POA: Diagnosis not present

## 2023-12-15 DIAGNOSIS — Z66 Do not resuscitate: Secondary | ICD-10-CM | POA: Diagnosis not present

## 2023-12-15 DIAGNOSIS — Z7984 Long term (current) use of oral hypoglycemic drugs: Secondary | ICD-10-CM

## 2023-12-15 DIAGNOSIS — Z800 Family history of malignant neoplasm of digestive organs: Secondary | ICD-10-CM

## 2023-12-15 DIAGNOSIS — I2510 Atherosclerotic heart disease of native coronary artery without angina pectoris: Secondary | ICD-10-CM | POA: Diagnosis present

## 2023-12-15 DIAGNOSIS — I509 Heart failure, unspecified: Secondary | ICD-10-CM

## 2023-12-15 DIAGNOSIS — Z515 Encounter for palliative care: Secondary | ICD-10-CM | POA: Diagnosis not present

## 2023-12-15 DIAGNOSIS — Z8774 Personal history of (corrected) congenital malformations of heart and circulatory system: Secondary | ICD-10-CM

## 2023-12-15 DIAGNOSIS — Z951 Presence of aortocoronary bypass graft: Secondary | ICD-10-CM

## 2023-12-15 DIAGNOSIS — J90 Pleural effusion, not elsewhere classified: Secondary | ICD-10-CM | POA: Diagnosis not present

## 2023-12-15 DIAGNOSIS — R0602 Shortness of breath: Secondary | ICD-10-CM | POA: Diagnosis not present

## 2023-12-15 DIAGNOSIS — Z79620 Long term (current) use of immunosuppressive biologic: Secondary | ICD-10-CM

## 2023-12-15 DIAGNOSIS — Z8619 Personal history of other infectious and parasitic diseases: Secondary | ICD-10-CM

## 2023-12-15 DIAGNOSIS — J9601 Acute respiratory failure with hypoxia: Secondary | ICD-10-CM | POA: Diagnosis not present

## 2023-12-15 DIAGNOSIS — Z801 Family history of malignant neoplasm of trachea, bronchus and lung: Secondary | ICD-10-CM

## 2023-12-15 DIAGNOSIS — Z87891 Personal history of nicotine dependence: Secondary | ICD-10-CM

## 2023-12-15 DIAGNOSIS — J910 Malignant pleural effusion: Secondary | ICD-10-CM | POA: Diagnosis present

## 2023-12-15 DIAGNOSIS — Z4682 Encounter for fitting and adjustment of non-vascular catheter: Secondary | ICD-10-CM | POA: Diagnosis not present

## 2023-12-15 LAB — BLOOD GAS, VENOUS
Acid-Base Excess: 3.2 mmol/L — ABNORMAL HIGH (ref 0.0–2.0)
Bicarbonate: 28.5 mmol/L — ABNORMAL HIGH (ref 20.0–28.0)
O2 Saturation: 80.6 %
Patient temperature: 37
pCO2, Ven: 45 mmHg (ref 44–60)
pH, Ven: 7.41 (ref 7.25–7.43)
pO2, Ven: 49 mmHg — ABNORMAL HIGH (ref 32–45)

## 2023-12-15 LAB — DIGOXIN LEVEL: Digoxin Level: 1.1 ng/mL (ref 0.8–2.0)

## 2023-12-15 LAB — TROPONIN T, HIGH SENSITIVITY
Troponin T High Sensitivity: 30 ng/L — ABNORMAL HIGH (ref 0–19)
Troponin T High Sensitivity: 28 ng/L — ABNORMAL HIGH (ref 0–19)

## 2023-12-15 LAB — COMPREHENSIVE METABOLIC PANEL WITH GFR
ALT: 12 U/L (ref 0–44)
AST: 38 U/L (ref 15–41)
Albumin: 3.2 g/dL — ABNORMAL LOW (ref 3.5–5.0)
Alkaline Phosphatase: 169 U/L — ABNORMAL HIGH (ref 38–126)
Anion gap: 14 (ref 5–15)
BUN: 13 mg/dL (ref 8–23)
CO2: 26 mmol/L (ref 22–32)
Calcium: 9.2 mg/dL (ref 8.9–10.3)
Chloride: 97 mmol/L — ABNORMAL LOW (ref 98–111)
Creatinine, Ser: 0.8 mg/dL (ref 0.61–1.24)
GFR, Estimated: >60 mL/min (ref >60)
Glucose, Bld: 101 mg/dL — ABNORMAL HIGH (ref 70–99)
Potassium: 3.6 mmol/L (ref 3.5–5.1)
Sodium: 136 mmol/L (ref 135–145)
Total Bilirubin: 1.3 mg/dL — ABNORMAL HIGH (ref 0.0–1.2)
Total Protein: 7.5 g/dL (ref 6.5–8.1)

## 2023-12-15 LAB — CBC
HCT: 54 % — ABNORMAL HIGH (ref 39.0–52.0)
Hemoglobin: 15.9 g/dL (ref 13.0–17.0)
MCH: 24.1 pg — ABNORMAL LOW (ref 26.0–34.0)
MCHC: 29.4 g/dL — ABNORMAL LOW (ref 30.0–36.0)
MCV: 81.7 fL (ref 80.0–100.0)
Platelets: 172 K/uL (ref 150–400)
RBC: 6.61 MIL/uL — ABNORMAL HIGH (ref 4.22–5.81)
RDW: 24.4 % — ABNORMAL HIGH (ref 11.5–15.5)
WBC: 6.1 K/uL (ref 4.0–10.5)
nRBC: 0 % (ref 0.0–0.2)

## 2023-12-15 LAB — HEMOGLOBIN A1C
Hgb A1c MFr Bld: 5.1 % (ref 4.8–5.6)
Mean Plasma Glucose: 99.67 mg/dL

## 2023-12-15 LAB — PRO BRAIN NATRIURETIC PEPTIDE: Pro Brain Natriuretic Peptide: 6850 pg/mL — ABNORMAL HIGH (ref <300.0)

## 2023-12-15 LAB — GLUCOSE, CAPILLARY
Glucose-Capillary: 93 mg/dL (ref 70–99)
Glucose-Capillary: 93 mg/dL (ref 70–99)

## 2023-12-15 MED ORDER — DIGOXIN 125 MCG PO TABS
125.0000 ug | ORAL_TABLET | Freq: Every day | ORAL | Status: DC
  Administered 2023-12-16 – 2023-12-17 (×2): 125 ug via ORAL
  Filled 2023-12-15 (×2): qty 1

## 2023-12-15 MED ORDER — AZITHROMYCIN 500 MG IV SOLR: 500.0000 mg | INTRAVENOUS | Status: DC

## 2023-12-15 MED ORDER — ACETAMINOPHEN 325 MG PO TABS: 650.0000 mg | ORAL_TABLET | Freq: Four times a day (QID) | ORAL | Status: DC | PRN

## 2023-12-15 MED ORDER — INSULIN ASPART 100 UNIT/ML IJ SOLN: 0.0000 [IU] | Freq: Every day | INTRAMUSCULAR | Status: DC

## 2023-12-15 MED ORDER — ONDANSETRON HCL 4 MG/2ML IJ SOLN: 4.0000 mg | Freq: Four times a day (QID) | INTRAMUSCULAR | Status: DC | PRN

## 2023-12-15 MED ORDER — TRAZODONE HCL 50 MG PO TABS
25.0000 mg | ORAL_TABLET | Freq: Every evening | ORAL | Status: DC | PRN
  Administered 2023-12-17: 25 mg via ORAL
  Filled 2023-12-15: qty 1

## 2023-12-15 MED ORDER — SPIRONOLACTONE 25 MG PO TABS
25.0000 mg | ORAL_TABLET | Freq: Every day | ORAL | Status: DC
  Administered 2023-12-16: 25 mg via ORAL
  Filled 2023-12-15: qty 1

## 2023-12-15 MED ORDER — IOHEXOL 300 MG/ML  SOLN
75.0000 mL | Freq: Once | INTRAMUSCULAR | Status: AC | PRN
  Administered 2023-12-15: 75 mL via INTRAVENOUS

## 2023-12-15 MED ORDER — SODIUM CHLORIDE 0.9 % IV SOLN
100.0000 mg | Freq: Two times a day (BID) | INTRAVENOUS | Status: DC
  Administered 2023-12-15 – 2023-12-17 (×4): 100 mg via INTRAVENOUS
  Filled 2023-12-15 (×4): qty 100

## 2023-12-15 MED ORDER — SODIUM CHLORIDE 0.9 % IV SOLN
1.0000 g | INTRAVENOUS | Status: DC
  Administered 2023-12-16: 1 g via INTRAVENOUS
  Filled 2023-12-15: qty 10

## 2023-12-15 MED ORDER — ATORVASTATIN CALCIUM 10 MG PO TABS
20.0000 mg | ORAL_TABLET | Freq: Every day | ORAL | Status: DC
  Administered 2023-12-16 – 2023-12-17 (×2): 20 mg via ORAL
  Filled 2023-12-15 (×2): qty 2

## 2023-12-15 MED ORDER — ACETAMINOPHEN 650 MG RE SUPP: 650.0000 mg | Freq: Four times a day (QID) | RECTAL | Status: DC | PRN

## 2023-12-15 MED ORDER — SODIUM CHLORIDE 0.9 % IV SOLN
1.0000 g | Freq: Once | INTRAVENOUS | Status: AC
  Administered 2023-12-15: 1 g via INTRAVENOUS
  Filled 2023-12-15: qty 10

## 2023-12-15 MED ORDER — METOPROLOL SUCCINATE ER 50 MG PO TB24
100.0000 mg | ORAL_TABLET | Freq: Every day | ORAL | Status: DC
  Administered 2023-12-15: 100 mg via ORAL
  Filled 2023-12-15 (×3): qty 2

## 2023-12-15 MED ORDER — INSULIN ASPART 100 UNIT/ML IJ SOLN
0.0000 [IU] | Freq: Three times a day (TID) | INTRAMUSCULAR | Status: DC
  Administered 2023-12-16: 2 [IU] via SUBCUTANEOUS
  Filled 2023-12-15: qty 2

## 2023-12-15 MED ORDER — ALBUTEROL SULFATE (2.5 MG/3ML) 0.083% IN NEBU: 2.5000 mg | INHALATION_SOLUTION | RESPIRATORY_TRACT | Status: DC | PRN

## 2023-12-15 MED ORDER — APIXABAN 5 MG PO TABS
5.0000 mg | ORAL_TABLET | Freq: Two times a day (BID) | ORAL | Status: DC
  Administered 2023-12-15: 5 mg via ORAL
  Filled 2023-12-15: qty 1

## 2023-12-15 MED ORDER — PANTOPRAZOLE SODIUM 40 MG PO TBEC
40.0000 mg | DELAYED_RELEASE_TABLET | Freq: Every day | ORAL | Status: DC
  Administered 2023-12-15 – 2023-12-17 (×3): 40 mg via ORAL
  Filled 2023-12-15 (×3): qty 1

## 2023-12-15 MED ORDER — ONDANSETRON HCL 4 MG PO TABS: 4.0000 mg | ORAL_TABLET | Freq: Four times a day (QID) | ORAL | Status: DC | PRN

## 2023-12-15 MED ORDER — MIRTAZAPINE 7.5 MG PO TABS
15.0000 mg | ORAL_TABLET | Freq: Every day | ORAL | Status: DC
  Administered 2023-12-15 – 2023-12-16 (×2): 15 mg via ORAL
  Filled 2023-12-15 (×2): qty 2

## 2023-12-15 MED ORDER — FUROSEMIDE 10 MG/ML IJ SOLN
40.0000 mg | Freq: Once | INTRAMUSCULAR | Status: AC
  Administered 2023-12-15: 40 mg via INTRAVENOUS
  Filled 2023-12-15: qty 4

## 2023-12-15 NOTE — ED Triage Notes (Signed)
 Stage 4 lung cancer undergoing tx. SOB at baseline but has worsened recently. Main concern is the left side of patients rib cage bulging outward without pain or noticeable spinal curvature.

## 2023-12-15 NOTE — Telephone Encounter (Signed)
 Spoke with patient's wife regarding patient's symptoms. She reports that over the past couple of days, the patient has been experiencing increased shortness of breath, fatigue, and episodes of choking more frequently while eating. She also noted that yesterday the patient was talking in his sleep. Wife sent a MyChart message with pictures of the patient's back, stating that the left side appears bulged out while the right side (where the cancer is located) appears normal.  Information relayed to Dr. Sherrod. Per Dr. Sherrod, patient should be evaluated in the ER. Wife was informed and voiced understanding of the recommendation.

## 2023-12-15 NOTE — ED Provider Notes (Signed)
 [place] EMERGENCY DEPARTMENT AT [facility] Provider Note   CSN: [id number] Arrival date & time: 12/15/23  1402     Patient presents with: Shortness of Breath   [name] is a 73 y.o. male.  {Add pertinent medical, surgical, social history, OB history to HPI:32947}  Shortness of Breath    Patient has a history of hyperlipidemia, coronary artery disease, permanent cardiac pacemaker, stroke, mesothelioma who presents to the ED with increasing shortness of breath.  Patient has been receiving palliative immunotherapy for his cancer.  He is not a candidate for surgery or chemotherapy.  Patient has a known pleural effusion.  Patient had a thoracentesis attempt on October 24.  Only a small amount of fluid approximately 95 cc was removed.  Patient has had increasing ornis of breath and his family noted he also had some swelling on the left posterior aspect of his chest.  He was having increasing difficulty this morning.  He has been having some choking episodes while sleeping.  He was instructed to come to the ED for further evaluation.  Patient denies any fevers.  He is not have any chest pain.  He states he feels fine at the moment  Prior to Admission medications   Medication Sig Start Date End Date Taking? Authorizing Provider  acetaminophen  (TYLENOL ) 325 MG tablet Take 2 tablets (650 mg total) by mouth every 4 (four) hours as needed for moderate pain. 06/22/22   Patsy Lenis, MD  amoxicillin  (AMOXIL ) 500 MG capsule Take 1 capsule (500 mg total) by mouth 3 (three) times daily. 08/09/23   Dennise Kingsley, MD  atorvastatin  (LIPITOR) 20 MG tablet TAKE 1 TABLET BY MOUTH EVERY DAY 07/21/23   Lalonde, John C, MD  cetirizine (ZYRTEC) 10 MG tablet Take 10 mg by mouth daily.    [provider]  digoxin  (LANOXIN ) 0.125 MG tablet TAKE 1 TABLET BY MOUTH EVERY DAY 03/02/23   Sabharwal, Aditya, DO  ELIQUIS  5 MG TABS tablet TAKE 1 TABLET BY MOUTH 2 TIMES A DAY 08/08/23   Sabharwal,  Aditya, DO  furosemide  (LASIX ) 20 MG tablet Take 1 tablet (20 mg total) by mouth daily. Patient is taking every other day. 07/22/23   Sabharwal, Aditya, DO  HYDROcodone -acetaminophen  (NORCO/VICODIN) 5-325 MG tablet Take 1 tablet by mouth every 6 (six) hours as needed for moderate pain (pain score 4-6). 12/01/23   Pickenpack-Cousar, Fannie SAILOR, NP  JARDIANCE  25 MG TABS tablet TAKE 1 TABLET BY MOUTH EVERY MORNING BEFORE BREAKFAST 11/11/23   [name], MD  lidocaine -prilocaine  (EMLA ) cream Apply to affected area once 10/11/23   Sherrod Sherrod, MD  metoprolol  succinate (TOPROL -XL) 100 MG 24 hr tablet TAKE 1 TABLET BY MOUTH EVERY DAY (TAKE WITH OR IMMEDIATELY FOLLOWING A MEAL) 08/11/23   Sabharwal, Aditya, DO  mirtazapine  (REMERON ) 15 MG tablet TAKE 1 TABLET BY MOUTH AT BEDTIME 11/28/23   Sherrod Sherrod, MD  omeprazole  (PRILOSEC  OTC) 20 MG tablet Take 1 tablet (20 mg total) by mouth daily. 03/22/23 03/21/24  Elicia Claw, MD  omeprazole  (PRILOSEC ) 20 MG capsule Take 20 mg by mouth daily. 09/06/23   [provider]  ondansetron  (ZOFRAN ) 8 MG tablet TAKE 1 TABLET (8 MG TOTAL) BY MOUTH EVERY 8 (EIGHT) HOURS AS NEEDED FOR NAUSEA OR VOMITING. 12/05/23   Sherrod Sherrod, MD  ondansetron  (ZOFRAN -ODT) 4 MG disintegrating tablet Take 1 tablet (4 mg total) by mouth every 8 (eight) hours as needed for nausea or vomiting. 09/15/23   [name], MD  oxymetazoline (AFRIN) 0.05 % nasal spray Place 1 spray into both nostrils at bedtime as needed for congestion.    [provider]  polyethylene glycol powder (GLYCOLAX /MIRALAX ) 17 GM/SCOOP powder Take 17 g by mouth daily as needed for mild constipation. 05/26/22   Cherlyn Labella, MD  prochlorperazine  (COMPAZINE ) 10 MG tablet TAKE 1 TABLET (10 MG TOTAL) BY MOUTH EVERY 6 (SIX) HOURS AS NEEDED FOR NAUSEA OR VOMITING. 12/05/23   Sherrod Sherrod, MD  spironolactone  (ALDACTONE ) 25 MG tablet TAKE 1 TABLET BY MOUTH EVERY DAY 11/08/23   Sabharwal, Aditya, DO     Allergies: Losartan     Review of Systems  Respiratory:  Positive for shortness of breath.     Updated Vital Signs BP 106/76 (BP Location: Right Arm)   Pulse (!) 108   Resp 20   SpO2 93%   Physical Exam Vitals and nursing note reviewed.  Constitutional:      Appearance: He is well-developed. He is ill-appearing.  HENT:     Head: Normocephalic and atraumatic.     Right Ear: External ear normal.     Left Ear: External ear normal.  Eyes:     General: No scleral icterus.       Right eye: No discharge.        Left eye: No discharge.     Conjunctiva/sclera: Conjunctivae normal.  Neck:     Trachea: No tracheal deviation.  Cardiovascular:     Rate and Rhythm: Regular rhythm. Tachycardia present.  Pulmonary:     Effort: Pulmonary effort is normal. No respiratory distress.     Breath sounds: No stridor. Decreased breath sounds present. No wheezing or rales.  Abdominal:     General: Bowel sounds are normal. There is no distension.     Palpations: Abdomen is soft.     Tenderness: There is no abdominal tenderness. There is no guarding or rebound.  Musculoskeletal:        General: No swelling, tenderness or deformity.     Cervical back: Neck supple.     Right lower leg: No edema.     Left lower leg: No edema.  Skin:    General: Skin is warm and dry.     Findings: No rash.  Neurological:     General: No focal deficit present.     Mental Status: He is alert. Mental status is at baseline.     Cranial Nerves: No cranial nerve deficit, dysarthria or facial asymmetry.     Sensory: No sensory deficit.     Motor: No abnormal muscle tone or seizure activity.     Coordination: Coordination normal.  Psychiatric:        Mood and Affect: Mood normal.     (all labs ordered are listed, but only abnormal results are displayed) Labs Reviewed - No data to display  EKG: EKG Interpretation Date/Time:  Thursday December 15 2023 14:18:12 EST Ventricular Rate:  106 PR  Interval:  41 QRS Duration:  163 QT Interval:  385 QTC Calculation: 512 R Axis:   -76  Text Interpretation: Sinus tachycardia with ventricular pacing Multiple ventricular premature complexes RBBB and LAFB No significant change since last tracing Confirmed by Randol Simmonds [id number]) on 12/15/2023 2:22:54 PM  Radiology: No results found.  {Document cardiac monitor, telemetry assessment procedure when appropriate:32947} Procedures   Medications Ordered in the ED - No data to display  Clinical Course as of 12/15/23 1440  Thu Dec 15, 2023  1438 Reviewed goals of care with patient.  States he would not want to be on a ventilator or breathing machine .  Patient does not have any respiratory distress that would require that this time [JK]    Clinical Course User Index [JK] Randol Simmonds, MD   {Click here for ABCD2, HEART and other calculators REFRESH Note before signing:1}                              Medical Decision Making Differential diagnosis includes but not limited to worsening pleural effusion, pneumonia, CHF, pulmonary embolism  Amount and/or Complexity of Data Reviewed Labs: ordered. Radiology: ordered.   ***  {Document critical care time when appropriate  Document review of labs and clinical decision tools ie CHADS2VASC2, etc  Document your independent review of radiology images and any outside records  Document your discussion with family members, caretakers and with consultants  Document social determinants of health affecting pt's care  Document your decision making why or why not admission, treatments were needed:32947:::1}   Final diagnoses:  None    ED Discharge Orders     None

## 2023-12-15 NOTE — Plan of Care (Signed)
 CT chest reviewed, shows progressive malignancy and continued effusion.  Possible bronchopneumonia as well.  Will start empiric IV azithromycin and IV Rocephin .  Pending improvement with antibiotics, diuresis and supportive care, patient may benefit from pulmonology evaluation in the morning although interventions may be limited by his poor baseline status.

## 2023-12-15 NOTE — H&P (Addendum)
 History and Physical  [name] FMW:[id number] DOB: Nov 09, 1950 DOA: 12/15/2023  PCP: [name], MD   Chief Complaint: Progressive shortness of breath  HPI: [name] is a 73 y.o. male with medical history significant for A-fib on Eliquis , non-insulin -dependent diabetes, hypertension, hyperlipidemia, recent diagnosis of mesothelioma on immunotherapy being admitted to the hospital with progressive dyspnea and shortness of breath.  Patient denies chronic dyspnea, however he says over the last couple of weeks, he has developed a nonproductive cough, has shortness of breath with ambulation and has orthopnea.  Denies any lower extremity edema.  Denies any fever, chills, sick contacts, chest pain, lower extremity edema, lower extremity pain, nausea or any other concerns.  States that he does have home oxygen , however has never needed to use it.  Even over the last few days when he is short of breath, he has been checking his pulse ox and it has never gone below 91%.  Notably, due to concern for continued malignant pleural effusion, he underwent thoracentesis with IR on 10/24 but only about 94 cc of fluid was drained.  While in the emergency department, nursing staff notified me that the patient's O2 saturation dropped to about 87% on room air at rest.  He was placed on 2 L nasal cannula oxygen .  Review of Systems: Please see HPI for pertinent positives and negatives. A complete 10 system review of systems are otherwise negative.  Past Medical History:  Diagnosis Date   BCE (basal cell epithelioma), arm    RIGHT SHOULDER   Bradycardia    Bumps on skin    on left side of nose for last 3 weeks   CAD (coronary artery disease)    a. s/p CABGx2 10/11/18 LIMA to LAD, SVG to RCA, EVH via right thigh.   Dyslipidemia    Dysrhythmia    aflutter/afib   Ectatic thoracic aorta    Esophageal stricture    GERD (gastroesophageal reflux disease)    Heart murmur    sees dr jerrald   Hyperlipidemia     Incidental pulmonary nodule, > 3mm and < 8mm 10/04/2018   Noted on CTA   Inguinal hernia    bilateral   Obesity    Presence of permanent cardiac pacemaker    Prostate cancer (HCC)    S/P aortic valve replacement with bioprosthetic valve 10/11/2018   23 mm Edwards Inspiris Resilia stented bovine pericardial tissue valve, miltral valve done also   S/P CABG x 2 10/11/2018   LIMA to LAD, SVG to RCA, EVH via right thigh   S/P mitral valve replacement with bioprosthetic valve 10/11/2018   27 mm Medtronic Mosaic stented porcine bioprosthetic tissue valve   S/P patent foramen ovale closure 10/11/2018   S/P placement of cardiac pacemaker 2020   Smoker    Stroke [facility])    Past Surgical History:  Procedure Laterality Date   AORTIC VALVE REPLACEMENT N/A 10/11/2018   Procedure: AORTIC VALVE REPLACEMENT (AVR) with 23 Inspiris Bioprosthetic Aortic valve.;  Surgeon: Dusty Sudie DEL, MD;  Location: [facility] OR;  Service: Open Heart Surgery;  Laterality: N/A;   AV FISTULA PLACEMENT Left 06/15/2022   Procedure: LEFT UPPER EXTREMITY THROMBECTOMY;  Surgeon: Sheree Penne Bruckner, MD;  Location: [facility] OR;  Service: Vascular;  Laterality: Left;   BIOPSY  03/22/2023   Procedure: BIOPSY;  Surgeon: Elicia Claw, MD;  Location: [facility] ENDOSCOPY;  Service: Gastroenterology;;   CLIPPING OF ATRIAL APPENDAGE N/A 10/11/2018   Procedure: CLIPPING OF LEFT ATRIAL APPENDAGE with  45 AtriCure Clip.;  Surgeon: Dusty Sudie DEL, MD;  Location: [facility] OR;  Service: Open Heart Surgery;  Laterality: N/A;   COLONOSCOPY  03/2008,2004   Dr. Dyane   COLONOSCOPY WITH PROPOFOL  N/A 03/22/2023   Procedure: COLONOSCOPY WITH PROPOFOL ;  Surgeon: Elicia Claw, MD;  Location: [facility] ENDOSCOPY;  Service: Gastroenterology;  Laterality: N/A;   CORONARY ARTERY BYPASS GRAFT N/A 10/11/2018   Procedure: CORONARY ARTERY BYPASS GRAFTING (CABG) x 2 using LIMA to the LAD and endoscopic greater saphenous vein harvesting to the RCA.;  Surgeon: Dusty Sudie DEL,  MD;  Location: [facility] OR;  Service: Open Heart Surgery;  Laterality: N/A;   CYSTOSCOPY  04/04/2019   Procedure: CYSTOSCOPY;  Surgeon: Devere Lonni Righter, MD;  Location: [facility];  Service: Urology;;   ESOPHAGOGASTRODUODENOSCOPY (EGD) WITH PROPOFOL  N/A 03/22/2023   Procedure: ESOPHAGOGASTRODUODENOSCOPY (EGD) WITH PROPOFOL ;  Surgeon: Elicia Claw, MD;  Location: [facility] ENDOSCOPY;  Service: Gastroenterology;  Laterality: N/A;   HERNIA REPAIR  12/11/10   BIH   IR IMAGING GUIDED PORT INSERTION  10/13/2023   IR THORACENTESIS ASP PLEURAL SPACE W/IMG GUIDE  12/02/2023   MITRAL VALVE REPLACEMENT N/A 10/11/2018   Procedure: MITRAL VALVE (MV) REPLACEMENT with 27 Mosaic Bioprosthetic Mitral valve.;  Surgeon: Dusty Sudie DEL, MD;  Location: [facility] OR;  Service: Open Heart Surgery;  Laterality: N/A;   PACEMAKER IMPLANT N/A 10/17/2018   Procedure: PACEMAKER IMPLANT;  Surgeon: Kelsie Agent, MD;  Location: [facility] INVAS[facility] LAB;  Service: Cardiovascular;  Laterality: N/A;   POLYPECTOMY  03/22/2023   Procedure: POLYPECTOMY;  Surgeon: Elicia Claw, MD;  Location: [facility] ENDOSCOPY;  Service: Gastroenterology;;   RADIOACTIVE SEED IMPLANT N/A 04/04/2019   Procedure: RADIOACTIVE SEED IMPLANT/BRACHYTHERAPY IMPLANT, cystoscopy;  Surgeon: Devere Lonni Righter, MD;  Location: [facility];  Service: Urology;  Laterality: N/A;  58 seeds   REPAIR OF PATENT FORAMEN OVALE N/A 10/11/2018   Procedure: CLOSURE OF PATENT FORAMEN OVALE;  Surgeon: Dusty Sudie DEL, MD;  Location: [facility] OR;  Service: Open Heart Surgery;  Laterality: N/A;   RIGHT/LEFT HEART CATH AND CORONARY ANGIOGRAPHY N/A 09/15/2018   Procedure: RIGHT/LEFT HEART CATH AND CORONARY ANGIOGRAPHY;  Surgeon: Verlin Lonni BIRCH, MD;  Location: [facility]VAS[facility] LAB;  Service: Cardiovascular;  Laterality: N/A;   RIGHT/LEFT HEART CATH AND CORONARY/GRAFT ANGIOGRAPHY N/A 05/25/2022   Procedure: RIGHT/LEFT HEART CATH AND CORONARY/GRAFT ANGIOGRAPHY;   Surgeon: Verlin Lonni BIRCH, MD;  Location: [facility];  Service: Cardiovascular;  Laterality: N/A;   TEE WITHOUT CARDIOVERSION N/A 12/12/2015   Procedure: TRANSESOPHAGEAL ECHOCARDIOGRAM (TEE);  Surgeon: Jerel Balding, MD;  Location: [facility] ENDOSCOPY;  Service: Cardiovascular;  Laterality: N/A;   TEE WITHOUT CARDIOVERSION N/A 10/11/2018   Procedure: TRANSESOPHAGEAL ECHOCARDIOGRAM (TEE);  Surgeon: Dusty Sudie DEL, MD;  Location: [facility] OR;  Service: Open Heart Surgery;  Laterality: N/A;   TEE WITHOUT CARDIOVERSION N/A 06/16/2022   Procedure: TRANSESOPHAGEAL ECHOCARDIOGRAM;  Surgeon: Gardenia Led, DO;  Location: [facility] CV LAB;  Service: Cardiovascular;  Laterality: N/A;   THYMECTOMY  1976   radaiation tx done   Social History:  reports that he has quit smoking. His smoking use included cigarettes. He started smoking about 45 years ago. He has a 14 pack-year smoking history. He has never been exposed to tobacco smoke. He has never used smokeless tobacco. He reports current alcohol use of about 1.0 standard drink of alcohol per week. He reports that he does not use drugs.  Allergies  Allergen Reactions   Losartan  Rash  Family History  Problem Relation Age of Onset   Colon cancer Mother    Lung cancer Father        smoker   CAD Neg Hx    Pancreatic cancer Neg Hx    Breast cancer Neg Hx      Prior to Admission medications   Medication Sig Start Date End Date Taking? Authorizing Provider  acetaminophen  (TYLENOL ) 325 MG tablet Take 2 tablets (650 mg total) by mouth every 4 (four) hours as needed for moderate pain. 06/22/22   Patsy Lenis, MD  amoxicillin  (AMOXIL ) 500 MG capsule Take 1 capsule (500 mg total) by mouth 3 (three) times daily. 08/09/23   Dennise Kingsley, MD  atorvastatin  (LIPITOR) 20 MG tablet TAKE 1 TABLET BY MOUTH EVERY DAY 07/21/23   Lalonde, John C, MD  cetirizine (ZYRTEC) 10 MG tablet Take 10 mg by mouth daily.    [provider]  digoxin  (LANOXIN ) 0.125  MG tablet TAKE 1 TABLET BY MOUTH EVERY DAY 03/02/23   Sabharwal, Aditya, DO  ELIQUIS  5 MG TABS tablet TAKE 1 TABLET BY MOUTH 2 TIMES A DAY 08/08/23   Sabharwal, Aditya, DO  furosemide  (LASIX ) 20 MG tablet Take 1 tablet (20 mg total) by mouth daily. Patient is taking every other day. 07/22/23   Sabharwal, Aditya, DO  HYDROcodone -acetaminophen  (NORCO/VICODIN) 5-325 MG tablet Take 1 tablet by mouth every 6 (six) hours as needed for moderate pain (pain score 4-6). 12/01/23   Pickenpack-Cousar, Athena N, NP  JARDIANCE  25 MG TABS tablet TAKE 1 TABLET BY MOUTH EVERY MORNING BEFORE BREAKFAST 11/11/23   [name], MD  lidocaine -prilocaine  (EMLA ) cream Apply to affected area once 10/11/23   Sherrod Sherrod, MD  metoprolol  succinate (TOPROL -XL) 100 MG 24 hr tablet TAKE 1 TABLET BY MOUTH EVERY DAY (TAKE WITH OR IMMEDIATELY FOLLOWING A MEAL) 08/11/23   Sabharwal, Aditya, DO  mirtazapine  (REMERON ) 15 MG tablet TAKE 1 TABLET BY MOUTH AT BEDTIME 11/28/23   Sherrod Sherrod, MD  omeprazole  (PRILOSEC  OTC) 20 MG tablet Take 1 tablet (20 mg total) by mouth daily. 03/22/23 03/21/24  Elicia Claw, MD  omeprazole  (PRILOSEC ) 20 MG capsule Take 20 mg by mouth daily. 09/06/23   [provider]  ondansetron  (ZOFRAN ) 8 MG tablet TAKE 1 TABLET (8 MG TOTAL) BY MOUTH EVERY 8 (EIGHT) HOURS AS NEEDED FOR NAUSEA OR VOMITING. 12/05/23   Sherrod Sherrod, MD  ondansetron  (ZOFRAN -ODT) 4 MG disintegrating tablet Take 1 tablet (4 mg total) by mouth every 8 (eight) hours as needed for nausea or vomiting. 09/15/23   Lalonde, John C, MD  oxymetazoline (AFRIN) 0.05 % nasal spray Place 1 spray into both nostrils at bedtime as needed for congestion.    [provider]  polyethylene glycol powder (GLYCOLAX /MIRALAX ) 17 GM/SCOOP powder Take 17 g by mouth daily as needed for mild constipation. 05/26/22   Akula, Vijaya, MD  prochlorperazine  (COMPAZINE ) 10 MG tablet TAKE 1 TABLET (10 MG TOTAL) BY MOUTH EVERY 6 (SIX) HOURS AS NEEDED FOR  NAUSEA OR VOMITING. 12/05/23   Sherrod Sherrod, MD  spironolactone  (ALDACTONE ) 25 MG tablet TAKE 1 TABLET BY MOUTH EVERY DAY 11/08/23   Gardenia Led, DO    Physical Exam: BP 99/84   Pulse (!) 102   Temp (!) 97.3 F (36.3 C) (Oral)   Resp (!) 21   SpO2 96%  General:  Alert, oriented, calm, in no acute distress, currently on 2 L nasal cannula.  He is getting dyspneic speaking several words at a time.  No cough.  No other evidence of respiratory distress. Cardiovascular: Irregularly irregular, no murmurs or rubs, no peripheral edema  Respiratory: Breath sounds diminished with poor inspiratory effort bilaterally, diminished in particular at the right base.  No active rhonchi, wheezing, no significant tachypnea or accessory muscle use. Abdomen: soft, nontender, nondistended, normal bowel tones heard  Skin: dry, no rashes  Musculoskeletal: no joint effusions, normal range of motion  Psychiatric: appropriate affect, normal speech  Neurologic: extraocular muscles intact, clear speech, moving all extremities with intact sensorium         Labs on Admission:  Basic Metabolic Panel: Recent Labs  Lab 12/15/23 1435  NA 136  K 3.6  CL 97*  CO2 26  GLUCOSE 101*  BUN 13  CREATININE 0.80  CALCIUM  9.2   Liver Function Tests: Recent Labs  Lab 12/15/23 1435  AST 38  ALT 12  ALKPHOS 169*  BILITOT 1.3*  PROT 7.5  ALBUMIN  3.2*   No results for input(s): LIPASE, AMYLASE in the last 168 hours. No results for input(s): AMMONIA in the last 168 hours. CBC: Recent Labs  Lab 12/15/23 1435  WBC 6.1  HGB 15.9  HCT 54.0*  MCV 81.7  PLT 172   Cardiac Enzymes: No results for input(s): CKTOTAL, CKMB, CKMBINDEX, TROPONINI in the last 168 hours. BNP (last 3 results) Recent Labs    01/20/23 0927 04/09/23 1255 06/30/23 1416  BNP 247.6* 188.8* 422.4*    ProBNP (last 3 results) Recent Labs    12/15/23 1435  PROBNP 6,850.0*    CBG: No results for input(s): GLUCAP  in the last 168 hours.  Radiological Exams on Admission: DG Chest Port 1 View Result Date: 12/15/2023 CLINICAL DATA:  Stage IV lung cancer, worsening shortness of breath EXAM: PORTABLE CHEST 1 VIEW COMPARISON:  12/02/2023 FINDINGS: Single frontal view of the chest demonstrates stable catheter from right chest wall port overlying the superior vena cava. Stable dual lead pacemaker. Postsurgical changes from median sternotomy and aortic valve replacement. The cardiac silhouette remains enlarged. There is progressive opacification of the right lung compatible with asymmetric edema or infection. Dense right basilar consolidation and loculated right pleural effusion and/or pleural thickening unchanged. Increased veiling opacity at the left lung base consistent with progressive left pleural effusion. No pneumothorax. No acute bony abnormalities. IMPRESSION: 1. Progressive opacification of the right hemithorax consistent with asymmetric edema or infection. 2. Chronic right basilar consolidation and loculated right pleural effusion and/or pleural thickening, compatible with known history of lung cancer. 3. Small left pleural effusion, increased since prior study. Electronically Signed   By: Ozell Daring M.D.   On: 12/15/2023 15:46   Assessment/Plan [name] is a 73 y.o. male with medical history significant for A-fib on Eliquis , non-insulin -dependent diabetes, hypertension, hyperlipidemia, recent diagnosis of mesothelioma on immunotherapy being admitted to the hospital with progressive dyspnea and shortness of breath.    Acute hypoxic respiratory failure-with a couple weeks of progressive orthopnea and dyspnea.  Documented O2 saturation of 87% in the ER on room air.  Etiology unclear at the moment, likely multifactorial given his known malignant pleural effusion, chronic right basilar consolidation/mass.  He has elevated proBNP but no comparison values, and looks euvolemic on exam.  PE would be in the  differential however unlikely as the patient has remained anticoagulated on Eliquis .  He may also be developing pneumonitis, etc. from his immunotherapy. -Observation admission -Continue supplemental oxygen  and wean as tolerated -Patient received a dose of empiric IV Rocephin  in the ER,  no indication for further antibiotics though this may change pending CT chest findings -Received IV Lasix  40 mg x 1 in the ER -Follow-up CT chest with contrast  Atrial fibrillation-telemetry, continue home Eliquis  as well as digoxin  and Toprol -XL  Hyperlipidemia-continue home statin  Right-sided malignant pleural mesothelioma, stage III-with lymph node involvement, receiving immunotherapy under the care of Dr. Gatha  Elevated alk phos-this appears to be chronic and relatively stable, patient without abdominal complaints. -Outpatient follow-up  Type 2 diabetes-non-insulin -dependent -Hold Jardiance , update A1c -Carb modified diet -Moderate dose sliding scale  Hypertension-Toprol -XL, Aldactone     Code Status: Full Code-patient states he and his wife has been discussing his CODE STATUS, for now he would like to remain full code.  In the meantime I will consult palliative care medicine which has been following him on the outpatient side, for further goals of care discussions.  Consults called: Oncology added to inpatient treatment team, palliative care consulted.  Admission status: Observation  Time spent: 56 minutes  [name] Gail MD [facility] Pager [phone]  If 7PM-7AM, please contact night-coverage [email or web address] Password [email or web address]  12/15/2023, 4:55 PM

## 2023-12-16 ENCOUNTER — Observation Stay (HOSPITAL_COMMUNITY)

## 2023-12-16 ENCOUNTER — Other Ambulatory Visit: Payer: Self-pay | Admitting: Physician Assistant

## 2023-12-16 DIAGNOSIS — I2510 Atherosclerotic heart disease of native coronary artery without angina pectoris: Secondary | ICD-10-CM | POA: Diagnosis present

## 2023-12-16 DIAGNOSIS — Z7984 Long term (current) use of oral hypoglycemic drugs: Secondary | ICD-10-CM | POA: Diagnosis not present

## 2023-12-16 DIAGNOSIS — C450 Mesothelioma of pleura: Principal | ICD-10-CM

## 2023-12-16 DIAGNOSIS — E119 Type 2 diabetes mellitus without complications: Secondary | ICD-10-CM | POA: Diagnosis present

## 2023-12-16 DIAGNOSIS — E785 Hyperlipidemia, unspecified: Secondary | ICD-10-CM | POA: Diagnosis present

## 2023-12-16 DIAGNOSIS — I4892 Unspecified atrial flutter: Secondary | ICD-10-CM | POA: Diagnosis present

## 2023-12-16 DIAGNOSIS — J9621 Acute and chronic respiratory failure with hypoxia: Secondary | ICD-10-CM | POA: Diagnosis present

## 2023-12-16 DIAGNOSIS — Z953 Presence of xenogenic heart valve: Secondary | ICD-10-CM | POA: Diagnosis not present

## 2023-12-16 DIAGNOSIS — J90 Pleural effusion, not elsewhere classified: Secondary | ICD-10-CM | POA: Diagnosis not present

## 2023-12-16 DIAGNOSIS — J189 Pneumonia, unspecified organism: Secondary | ICD-10-CM

## 2023-12-16 DIAGNOSIS — Z85828 Personal history of other malignant neoplasm of skin: Secondary | ICD-10-CM | POA: Diagnosis not present

## 2023-12-16 DIAGNOSIS — Z87891 Personal history of nicotine dependence: Secondary | ICD-10-CM | POA: Diagnosis not present

## 2023-12-16 DIAGNOSIS — Z7901 Long term (current) use of anticoagulants: Secondary | ICD-10-CM | POA: Diagnosis not present

## 2023-12-16 DIAGNOSIS — C7951 Secondary malignant neoplasm of bone: Secondary | ICD-10-CM | POA: Diagnosis present

## 2023-12-16 DIAGNOSIS — I110 Hypertensive heart disease with heart failure: Secondary | ICD-10-CM | POA: Diagnosis present

## 2023-12-16 DIAGNOSIS — Z515 Encounter for palliative care: Secondary | ICD-10-CM | POA: Diagnosis not present

## 2023-12-16 DIAGNOSIS — Z8546 Personal history of malignant neoplasm of prostate: Secondary | ICD-10-CM | POA: Diagnosis not present

## 2023-12-16 DIAGNOSIS — Z66 Do not resuscitate: Secondary | ICD-10-CM | POA: Diagnosis not present

## 2023-12-16 DIAGNOSIS — C459 Mesothelioma, unspecified: Secondary | ICD-10-CM | POA: Diagnosis present

## 2023-12-16 DIAGNOSIS — I509 Heart failure, unspecified: Secondary | ICD-10-CM | POA: Diagnosis not present

## 2023-12-16 DIAGNOSIS — Z951 Presence of aortocoronary bypass graft: Secondary | ICD-10-CM | POA: Diagnosis not present

## 2023-12-16 DIAGNOSIS — J9601 Acute respiratory failure with hypoxia: Secondary | ICD-10-CM | POA: Diagnosis not present

## 2023-12-16 DIAGNOSIS — J910 Malignant pleural effusion: Secondary | ICD-10-CM | POA: Diagnosis present

## 2023-12-16 DIAGNOSIS — Z950 Presence of cardiac pacemaker: Secondary | ICD-10-CM | POA: Diagnosis not present

## 2023-12-16 DIAGNOSIS — Z79899 Other long term (current) drug therapy: Secondary | ICD-10-CM | POA: Diagnosis not present

## 2023-12-16 DIAGNOSIS — Z8673 Personal history of transient ischemic attack (TIA), and cerebral infarction without residual deficits: Secondary | ICD-10-CM | POA: Diagnosis not present

## 2023-12-16 DIAGNOSIS — Z800 Family history of malignant neoplasm of digestive organs: Secondary | ICD-10-CM | POA: Diagnosis not present

## 2023-12-16 DIAGNOSIS — I480 Paroxysmal atrial fibrillation: Secondary | ICD-10-CM | POA: Diagnosis present

## 2023-12-16 DIAGNOSIS — R1310 Dysphagia, unspecified: Secondary | ICD-10-CM | POA: Diagnosis present

## 2023-12-16 LAB — GLUCOSE, CAPILLARY
Glucose-Capillary: 101 mg/dL — ABNORMAL HIGH (ref 70–99)
Glucose-Capillary: 109 mg/dL — ABNORMAL HIGH (ref 70–99)
Glucose-Capillary: 121 mg/dL — ABNORMAL HIGH (ref 70–99)
Glucose-Capillary: 93 mg/dL (ref 70–99)

## 2023-12-16 MED ORDER — CHLORHEXIDINE GLUCONATE CLOTH 2 % EX PADS: 6.0000 | MEDICATED_PAD | Freq: Every day | CUTANEOUS | Status: DC

## 2023-12-16 MED ORDER — FUROSEMIDE 10 MG/ML IJ SOLN
40.0000 mg | Freq: Two times a day (BID) | INTRAMUSCULAR | Status: DC
  Administered 2023-12-16 (×2): 40 mg via INTRAVENOUS
  Filled 2023-12-16 (×3): qty 4

## 2023-12-16 MED ORDER — ENSURE PLUS HIGH PROTEIN PO LIQD
237.0000 mL | Freq: Two times a day (BID) | ORAL | Status: DC
  Administered 2023-12-16 – 2023-12-17 (×2): 237 mL via ORAL

## 2023-12-16 NOTE — Procedures (Addendum)
 Date of ultrasound: 12/16/2023  Indication: Pleural fluid.  Findings: Right pleural examination: Very cellular right sided pleural effusion with intermittent areas of less cellularity.  right hepatic enlargement.  Possible septation seen.  Will be technically challenging with concerns for any utility of placing a chest tube.  Conclusion: Complex mild-moderate right-sided pleural effusion.  Images saved in media.

## 2023-12-16 NOTE — Progress Notes (Signed)
 Went entering patients room, nurse tech pointed out small skin tear on patients left leg, pt states he was trying to sit up and scratched himself which led to a skin tear.  Picture taken and placed in chart, site cleansed and covered with mepilex foam

## 2023-12-16 NOTE — Evaluation (Signed)
 Clinical/Bedside Swallow Evaluation Patient Details  Name: BRADEY LUZIER MRN: [id number] Date of Birth: 1951-01-26  Today's Date: 12/16/2023 Time: SLP Start Time (ACUTE ONLY): 1505 SLP Stop Time (ACUTE ONLY): 1515 SLP Time Calculation (min) (ACUTE ONLY): 10 min  Past Medical History:  Past Medical History:  Diagnosis Date   BCE (basal cell epithelioma), arm    RIGHT SHOULDER   Bradycardia    Bumps on skin    on left side of nose for last 3 weeks   CAD (coronary artery disease)    a. s/p CABGx2 10/11/18 LIMA to LAD, SVG to RCA, EVH via right thigh.   Dyslipidemia    Dysrhythmia    aflutter/afib   Ectatic thoracic aorta    Esophageal stricture    GERD (gastroesophageal reflux disease)    Heart murmur    sees dr jerrald   Hyperlipidemia    Incidental pulmonary nodule, > 3mm and < 8mm 10/04/2018   Noted on CTA   Inguinal hernia    bilateral   Obesity    Presence of permanent cardiac pacemaker    Prostate cancer (HCC)    S/P aortic valve replacement with bioprosthetic valve 10/11/2018   23 mm Edwards Inspiris Resilia stented bovine pericardial tissue valve, miltral valve done also   S/P CABG x 2 10/11/2018   LIMA to LAD, SVG to RCA, EVH via right thigh   S/P mitral valve replacement with bioprosthetic valve 10/11/2018   27 mm Medtronic Mosaic stented porcine bioprosthetic tissue valve   S/P patent foramen ovale closure 10/11/2018   S/P placement of cardiac pacemaker 2020   Smoker    Stroke [facility])    Past Surgical History:  Past Surgical History:  Procedure Laterality Date   AORTIC VALVE REPLACEMENT N/A 10/11/2018   Procedure: AORTIC VALVE REPLACEMENT (AVR) with 23 Inspiris Bioprosthetic Aortic valve.;  Surgeon: Dusty Sudie DEL, MD;  Location: [facility] OR;  Service: Open Heart Surgery;  Laterality: N/A;   AV FISTULA PLACEMENT Left 06/15/2022   Procedure: LEFT UPPER EXTREMITY THROMBECTOMY;  Surgeon: Sheree Penne Bruckner, MD;  Location: [facility] OR;  Service: Vascular;  Laterality:  Left;   BIOPSY  03/22/2023   Procedure: BIOPSY;  Surgeon: Elicia Claw, MD;  Location: [facility] ENDOSCOPY;  Service: Gastroenterology;;   CLIPPING OF ATRIAL APPENDAGE N/A 10/11/2018   Procedure: CLIPPING OF LEFT ATRIAL APPENDAGE with 45 AtriCure Clip.;  Surgeon: Dusty Sudie DEL, MD;  Location: [facility] OR;  Service: Open Heart Surgery;  Laterality: N/A;   COLONOSCOPY  03/2008,2004   Dr. Dyane   COLONOSCOPY WITH PROPOFOL  N/A 03/22/2023   Procedure: COLONOSCOPY WITH PROPOFOL ;  Surgeon: Elicia Claw, MD;  Location: [facility] ENDOS[facility];  Service: Gastroenterology;  Laterality: N/A;   CORONARY ARTERY BYPASS GRAFT N/A 10/11/2018   Procedure: CORONARY ARTERY BYPASS GRAFTING (CABG) x 2 using LIMA to the LAD and endoscopic greater saphenous vein harvesting to the RCA.;  Surgeon: Dusty Sudie DEL, MD;  Location: [facility] OR;  Service: Open Heart Surgery;  Laterality: N/A;   CYSTOSCOPY  04/04/2019   Procedure: CYSTOSCOPY;  Surgeon: Devere Bruckner Righter, MD;  Location: [facility];  Service: Urology;;   ESOPHAGOGASTRODUODENOSCOPY (EGD) WITH PROPOFOL  N/A 03/22/2023   Procedure: ESOPHAGOGASTRODUODENOSCOPY (EGD) WITH PROPOFOL ;  Surgeon: Elicia Claw, MD;  Location: [facility];  Service: Gastroenterology;  Laterality: N/A;   HERNIA REPAIR  12/11/10   BIH   IR IMAGING GUIDED PORT INSERTION  10/13/2023   IR THORACENTESIS ASP PLEURAL SPACE W/IMG GUIDE  12/02/2023   MITRAL VALVE REPLACEMENT  N/A 10/11/2018   Procedure: MITRAL VALVE (MV) REPLACEMENT with 27 Mosaic Bioprosthetic Mitral valve.;  Surgeon: Dusty Sudie DEL, MD;  Location: [facility] OR;  Service: Open Heart Surgery;  Laterality: N/A;   PACEMAKER IMPLANT N/A 10/17/2018   Procedure: PACEMAKER IMPLANT;  Surgeon: Kelsie Agent, MD;  [facility]: [facility];  Service: Cardiovascular;  Laterality: N/A;   POLYPECTOMY  03/22/2023   Procedure: POLYPECTOMY;  Surgeon: Elicia Claw, MD;  Location: [facility];  Service: Gastroenterology;;   RADIOACTIVE SEED  IMPLANT N/A 04/04/2019   Procedure: RADIOACTIVE SEED IMPLANT/BRACHYTHERAPY IMPLANT, cystoscopy;  Surgeon: Devere Lonni Righter, MD;  Location[facility];  Service: Urology;  Laterality: N/A;  58 seeds   REPAIR OF PATENT FORAMEN OVALE N/A 10/11/2018   Procedure: CLOSURE OF PATENT FORAMEN OVALE;  Surgeon: Dusty Sudie DEL, MD;  Location: [facility] OR;  Service: Open Heart Surgery;  Laterality: N/A;   RIGHT/LEFT HEART CATH AND CORONARY ANGIOGRAPHY N/A 09/15/2018   Procedure: RIGHT/LEFT HEART CATH AND CORONARY ANGIOGRAPHY;  Surgeon: Verlin Lonni BIRCH, MD;  Location: [facility];  Service: Cardiovascular;  Laterality: N/A;   RIGHT/LEFT HEART CATH AND CORONARY/GRAFT ANGIOGRAPHY N/A 05/25/2022   Procedure: RIGHT/LEFT HEART CATH AND CORONARY/GRAFT ANGIOGRAPHY;  Surgeon: Verlin Lonni BIRCH, MD;  Location: [facility];  Service: Cardiovascular;  Laterality: N/A;   TEE WITHOUT CARDIOVERSION N/A 12/12/2015   Procedure: TRANSESOPHAGEAL ECHOCARDIOGRAM (TEE);  Surgeon: Jerel Balding, MD;  Location: [facility] ENDOSCOPY;  Service: Cardiovascular;  Laterality: N/A;   TEE WITHOUT CARDIOVERSION N/A 10/11/2018   Procedure: TRANSESOPHAGEAL ECHOCARDIOGRAM (TEE);  Surgeon: Dusty Sudie DEL, MD;  Location: [facility] OR;  Service: Open Heart Surgery;  Laterality: N/A;   TEE WITHOUT CARDIOVERSION N/A 06/16/2022   Procedure: TRANSESOPHAGEAL ECHOCARDIOGRAM;  Surgeon: Gardenia Led, DO;  Location: [facility];  Service: Cardiovascular;  Laterality: N/A;   THYMECTOMY  1976   radaiation tx done   HPI:  73 yo male adm to [facility] with progressive SOB, CT showed progressive malignancy and continued pleural effusion. Possible bronchopneumonia.  Pt is a smoker x 45 years per chart, pt says 15 cigarettes daily for 20 years.  Reports voice is weakening as well as gross weakness - over the last several weeks.  Pt with h/o GERD.  Pt reports he does not cough with intake and takes his medications with liquids.   Per  oncology notes, he started treatment with immunotherapy with ipilimumab  and nivolumab  status post 1 dose.  Unfortunately patient was found on recent imaging studies to have progressive disease in the right and left lung as well as bone metastases.  Pt also has Barrett's esophagus hx and was seen Summer 2025 for fatigue, weakness, and weight loss.    Assessment / Plan / Recommendation  Clinical Impression  Patient presents with gross weakness and dysphonia but no focal CN deficits.   He admits to dysphonia and progressive weakness over the past few weeks. He is xerostomic and prefers room tem liquids during intake. He was observed being fed water, tea, few bites of greens and peas and Italian Ice.    Was noted to cough, clear his throat and eructate with approx 75% of liquids - but not with solids.  He denies this being aberrant for him.    Discussed potential modifications to liquids (slightly thicker drinks i.e. OJ -as pt reports less coughing with OJ and Ensure) and/or administration (straw to cup to tsp) to maximize his comfort with po.  Left sample packet of thickener in  the room with the pt.  He only consumes a few bites/sips per meal recently per his wife.   SLP set up bifurcating suction for pt - but it was not working - therefore requested RN obtain staff to modify it as pt will require this for pulmonology to use for draining pleural fluid.   SLP phoned materials to request new equipment be brought to pt and referred material staff to nursing.    At this time, note recommendation for pt to be hospice per oncology and education completed to faciliate comfort, less frequent aspiration with PO intake. Thanks for this consult.  SLP Visit Diagnosis: Dysphagia, unspecified (R13.10)    Aspiration Risk  Moderate aspiration risk;Risk for inadequate nutrition/hydration    Diet Recommendation Regular;Thin liquid;Nectar-thick liquid    Liquid Administration via: Cup;Straw;Spoon Medication  Administration: Other (Comment) (as tolerated best) Supervision: Patient able to self feed Compensations: Slow rate;Small sips/bites;Other (Comment) (stop and rest if dyspneic) Postural Changes: Remain upright for at least 30 minutes after po intake;Seated upright at 90 degrees    Other  Recommendations Oral Care Recommendations: Oral care BID Caregiver Recommendations: Have oral suction available     Assistance Recommended at Discharge  N/a  Functional Status Assessment Patient has had a recent decline in their functional status and/or demonstrates limited ability to make significant improvements in function in a reasonable and predictable amount of time  Frequency and Duration min 1 x/week  1 week       Prognosis   Hospice advised per oncology     Swallow Study   General Date of Onset: 12/16/23 HPI: 73 yo male adm to [facility] with progressive SOB, CT showed progressive malignancy and continued pleural effusion. Possible bronchopneumonia.  Pt is a smoker x 45 years per chart, pt says 15 cigarettes daily for 20 years.  Reports voice is weakening as well as gross weakness - over the last several weeks.  Pt with h/o GERD.  Pt reports he does not cough with intake and takes his medications with liquids.   Per oncology notes, he started treatment with immunotherapy with ipilimumab  and nivolumab  status post 1 dose.  Unfortunately patient was found on recent imaging studies to have progressive disease in the right and left lung as well as bone metastases.  Pt also has Barrett's esophagus hx and was seen Summer 2025 for fatigue, weakness, and weight loss. Type of Study: Bedside Swallow Evaluation Diet Prior to this Study: Regular;Thin liquids (Level 0) Temperature Spikes Noted: No Respiratory Status: Nasal cannula History of Recent Intubation: No Behavior/Cognition: Alert;Cooperative;Pleasant mood Oral Cavity Assessment: Within Functional Limits Oral Care Completed by SLP: No Oral Cavity -  Dentition: Adequate natural dentition Vision: Functional for self-feeding Self-Feeding Abilities: Able to feed self Patient Positioning: Upright in bed Baseline Vocal Quality: Breathy;Low vocal intensity;Suspected CN X (Vagus) involvement;Other (comment) (weak) Volitional Cough: Weak Volitional Swallow: Able to elicit    Oral/Motor/Sensory Function Overall Oral Motor/Sensory Function: Generalized oral weakness (generalized weakness)   Ice Chips Ice chips: Not tested   Thin Liquid Thin Liquid: Impaired Presentation: Self Fed;Straw Pharyngeal  Phase Impairments: Throat Clearing - Immediate;Cough - Delayed    Nectar Thick Nectar Thick Liquid: Not tested   Honey Thick Honey Thick Liquid: Not tested   Puree Puree: Not tested   Solid     Solid: Within functional limits Presentation: Carollynn Nicolas Emmie Jenkins 12/16/2023,3:47 PM  Madelin POUR, MS [facility] SLP Acute Rehab Services Office [phone]

## 2023-12-16 NOTE — Progress Notes (Signed)
 PROGRESS NOTE    [name]  FMW:[id number] DOB: [birth year] DOA: 12/15/2023 PCP: [name], MD    Brief Narrative:   [name] is a 73 y.o. male with past medical history significant for paroxysmal atrial fibrillation/flutter on Eliquis  s/p left atrial appendage clipping and PFO closure, CHB s/p PPM, Hx severe AS/MS s/p bioprosthetic AVR/MVR, Hx Enterococcus faecalis bacteremia s/p IV abx, DM2, HTN, HLD, CAD s/p CABG x 2, recent diagnosis of mesothelioma with new started on immunotherapy who presented to [facility] ED on 12/15/2023 with worsening shortness of breath.  Has recently started palliative immunotherapy, not a candidate for surgery or chemotherapy.  He has had a known pleural effusion with thoracentesis attempted on December 02, 2023 with only a small amount of fluid, 95 mL removed.  Additionally, family noticed some choking episodes while eating.  Given his progressive symptoms, he sought further care in the ED.  In the ED, temperature 97.3 F, HR 108, RR 23, BP 106/76, SpO2 98% on room air and placed on 2 L nasal cannula with SpO2 94%.  VBG 7.41, P CO2 45, pCO2 49.  WBC 6.1, hemoglobin 15.9, platelet count 172.  Sodium 136, potassium 3.6, chloride 97, CO2 26, glucose 101, BUN 13, creatinine 0.80.  AST 38, ALT 12, total bilirubin 1.3.  BNP elevated 6050.  High sensitive troponin 30 followed by 28.  Digoxin  level 1.1.  Chest x-ray with progressive opacification right hemithorax consistent with asymmetric edema versus infection, chronic right basilar consolidation and loculated right pleural effusion and/or pleural thickening compatible with known history of lung cancer, small left pleural effusion, increased since prior study.  CT chest with contrast with progressive metastatic disease throughout the right hemithorax, increased size of loculated pleural effusion on the right, new nodular pleural thickening and new likely malignant pleural effusion on the left, airspace opacities in the  bilateral lower lobes may be due to bronchopneumonia, aspiration, progressive malignancy or combination; new sclerotic lesion right lateral seventh rib with associated cortical buckle fracture, suspicious for pathologic fracture; acute/subacute fractures posterior left 9th through 12th ribs including mildly displaced 10th and 11th and nondisplaced ninth rib, new since 09/26/2023 without definite underlying osseous lesion to suggest pathologic fracture, debris's in the trachea.  Patient was given IV Lasix , started on empiric antibiotics for concern of pneumonia.  TRH consulted for admission for further evaluation and management.  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  Follows with medical oncology outpatient, Dr. Gatha.  Recently started on immunotherapy with ipilimumab  and nivolumab .  Patient with progressive dyspnea with known right pleural effusion with recent thoracentesis attempt with only 95 mL of fluid able to be removed.  Also now with new left small pleural effusion both likely malignant in the setting of known history of mesothelioma.  Imaging notable for progression of disease.   -- Medical oncology following, appreciate assistance -- Pulmonology consulted for consideration of chest tube placement with lytics given loculated pleural effusion; holding Eliquis  -- Palliative care consulted for assistance with goals of care/medical decision making given overall prognosis appears poor given progression of disease despite palliative immunotherapy. --TTE: Pending -- Furosemide  40 mg IV every 12 hours -- Strict I's and O's -- Maintain SpO2 greater than 92%, currently on room air  Aspiration  pneumonia vs bronchopneumonia CT chest with contrast with findings of debris's in the trachea, airspace opacities bilateral lower lobes concerning for bronchopneumonia versus aspiration.  Patient is afebrile without leukocytosis. -- Doxycycline 100 mg p.o. twice daily -- Ceftriaxone  1 g IV every 24 hours -- SLP evaluated  Dysphagia Patient spouse reports that patient has had some difficulty with swallowing solids recently.  Imaging notable for debris's in the trachea, possible aspiration pneumonia. -- SLP evaluation -- Aspiration precautions  Paroxysmal atrial fibrillation/flutter on Eliquis  s/p left atrial appendage clipping and PFO closure CHB s/p PPM Hx severe AS/MS s/p bioprosthetic AVR/MVR HTN CAD s/p CABG x 2 Follows with cardiology outpatient. -- Checking echo as above -- Digoxin  125 mcg p.o. daily (dig level 1.1 on admission) -- Furosemide  40 g IV every 12 hours -- Metoprolol  succinate 100 mg p.o. daily -- Hold spironolactone  in the setting of IV Lasix  use -- Holding Eliquis  for potential chest tube placement, last dose evening 11/6  HLD -- Atorvastatin  20 mg p.o. daily  DM2 At baseline on Jardiance  25 mg p.o. daily.  Hemoglobin A1c 5.1 on 12/15/2023, well-controlled. -- Moderate SSI for coverage -- CBGs qAC/HS  Hx Enterococcus faecalis bacteremia Completed 6 weeks course of IV antibiotics.  DVT prophylaxis: Holding Eliquis  for potential chest tube placement    Code Status: Full Code Family Communication: Data spouse present at bedside this morning  Disposition Plan:  Level of care: Med-Surg Status is: Observation The patient remains OBS appropriate and will d/c before 2 midnights.    Consultants:  Pulmonology Medical oncology Palliative care  Procedures:  TTE: Pending  Antimicrobials:  Doxycycline Ceftriaxone    Subjective: Patient seen examined bedside, sitting edge of bed eating breakfast.  No specific complaints, reports dyspnea slightly improved.   Spouse present at bedside.  Discussed imaging findings concerning for progression of his cancer which is concerning.  Also discussed may benefit from chest tube placement for drainage of pleural effusion given loculated distribution and spouse and patient agreeable.  Seen by medical oncology, Dr. Sherrod this morning and recommended palliative care and consideration of hospice given poor response to immunotherapy.  Patient with no other specific complaints, questions, concerns at this time.  Denies headache, no dizziness, no chest pain, no palpitations, no fever/chills/night sweats, no nausea/vomit/diarrhea no fatigue, no paresthesia.  No acute events overnight per nurse staff.  Objective: Vitals:   12/15/23 2218 12/16/23 0210 12/16/23 0621 12/16/23 1051  BP: 99/69 (!) 100/59 113/81 109/65  Pulse:    (!) 108  Resp: 16 14 16    Temp: 98.4 F (36.9 C) (!) 97.5 F (36.4 C) 97.6 F (36.4 C)   TempSrc: Oral Oral Oral   SpO2: 90% 93% 90%   Weight:      Height:        Intake/Output Summary (Last 24 hours) at 12/16/2023 1057 Last data filed at 12/16/2023 0944 Gross per 24 hour  Intake 310 ml  Output 1950 ml  Net -1640 ml   Filed Weights   12/15/23 1808  Weight: 74.8 kg    Examination:  Physical Exam: GEN: NAD, alert and oriented x 3, chronically ill appearance HEENT: NCAT, PERRL, EOMI, sclera clear, MMM PULM: Decreased aeration/breath sounds from right base to right hemithorax, no wheezing/crackles, normal respiratory effort with accessory muscle use, on room air CV: RRR w/o M/G/R GI: abd soft, NTND, + BS MSK: no peripheral edema, moves all remedies independently NEURO: No focal neurological deficit PSYCH: normal mood/affect Integumentary: No concerning rashes/lesions/wounds no exposed skin surfaces  Data Reviewed: I have personally reviewed following labs and imaging studies  CBC: Recent Labs  Lab 12/15/23 1435  WBC 6.1  HGB 15.9  HCT 54.0*  MCV 81.7  PLT 172   Basic  Metabolic Panel: Recent Labs  Lab 12/15/23 1435  NA 136  K 3.6  CL 97*  CO2 26  GLUCOSE 101*  BUN 13  CREATININE 0.80  CALCIUM  9.2   GFR: Estimated Creatinine Clearance: 76.9 mL/min (by C-G formula based on SCr of 0.8 mg/dL). Liver Function Tests: Recent Labs  Lab 12/15/23 1435  AST 38  ALT 12  ALKPHOS 169*  BILITOT 1.3*  PROT 7.5  ALBUMIN  3.2*   No results for input(s): LIPASE, AMYLASE in the last 168 hours. No results for input(s): AMMONIA in the last 168 hours. Coagulation Profile: No results for input(s): INR, PROTIME in the last 168 hours. Cardiac Enzymes: No results for input(s): CKTOTAL, CKMB, CKMBINDEX, TROPONINI in the last 168 hours. BNP (last 3 results) Recent Labs    12/15/23 1435  PROBNP 6,850.0*   HbA1C: Recent Labs    12/15/23 1728  HGBA1C 5.1   CBG: Recent Labs  Lab 12/15/23 1815 12/15/23 2220 12/16/23 0742  GLUCAP 93 93 101*   Lipid Profile: No results for input(s): CHOL, HDL, LDLCALC, TRIG, CHOLHDL, LDLDIRECT in the last 72 hours. Thyroid  Function Tests: No results for input(s): TSH, T4TOTAL, FREET4, T3FREE, THYROIDAB in the last 72 hours. Anemia Panel: No results for input(s): VITAMINB12, FOLATE, FERRITIN, TIBC, IRON , RETICCTPCT in the last 72 hours. Sepsis Labs: No results for input(s): PROCALCITON, LATICACIDVEN in the last 168 hours.  No results found for this or any previous visit (from the past 240 hours).       Radiology Studies: CT Chest W Contrast Result Date: 12/15/2023 EXAM: CT CHEST WITH CONTRAST 12/15/2023 04:41:00 PM TECHNIQUE: CT of the chest was performed with the administration of 75 mL of iohexol  (OMNIPAQUE ) 300 MG/ML solution. Multiplanar reformatted images are provided for review. Automated exposure control, iterative reconstruction, and/or weight based adjustment of the mA/kV was utilized to reduce the radiation dose to as low as reasonably achievable.  COMPARISON: Chest x-ray and CT 09/26/2023 and CT chest 09/13/2023. CLINICAL HISTORY: Worsening shortness of breath and stage 4 lung cancer. Pleural effusion, increasing edema vs infection. FINDINGS: MEDIASTINUM: Aortic valve replacement. Coronary artery and aortic atherosclerotic calcification. Cardiomegaly. Reflux of contrast into the IVC and hepatic veins compatible with elevated right heart pressures. Sternotomy. Debris in the trachea, aspiration risk. LYMPH NODES: Progressive mediastinal and confluent right hilar adenopathy. For example, the pretracheal node on series 2 image 49 measures 27 mm, previously 12 mm. The right hilar adenopathy is confluent with the pleural thickening. LUNGS AND PLEURA: Peribronchovascular consolidation in the right lower lobe and right middle lobe. Ground glass opacities in the left lower lobe. Findings may be due to bronchopneumonia, aspiration, or progression of malignancy. Unchanged 7 mm nodule in the left apex (series 4 image 28). Diffuse nodular thickening of the pleura throughout the right hemithorax extending into both the major and minor fissure has progressed compared to 09/26/2023. Increased size of the loculated moderate right pleural effusion. There is a new small loculated left pleural effusion with fluid extending into the fissure. There is new nodular pleural thickening on the left near the apex. No pneumothorax. SOFT TISSUES/BONES: Acute or subacute fractures of the posterior left 9-12 ribs at the costovertebral junction. Additional mildly displaced fractures of the left posterior 10th and 11th ribs and nondisplaced fracture of  the posterior left 9th rib. These are new since 09/26/2023. No definite underlying osseous lesion to suggest pathologic fracture. There is a new sclerotic lesion in the right lateral 7th rib with associated cortical buckle fracture suspicious for pathologic fracture (series 4 image 92). Chronic compression fractures of multiple thoracic  vertebral bodies. Left chest wall pacemaker. UPPER ABDOMEN: Limited images of the upper abdomen demonstrates no acute abnormality. IMPRESSION: 1. Progressive metastatic disease throughout the right hemithorax Increased size of the loculated pleural effusion on the right. New nodular pleural thickening and new likely malignant pleural effusion on the left. 2. Airspace opacities in the bilateral lower lobes may be due to bronchopneumonia, aspiration, progressive malignancy, or a combination. 3. New sclerotic lesion in the right lateral 7th rib with associated cortical buckle fracture, suspicious for pathologic fracture. 4. Acute/subacute fractures of the posterior left 9th12th ribs (including mildly displaced 10th11th and nondisplaced 9th), new since 09/26/23, without definite underlying osseous lesion to suggest pathologic fracture. 5. Debris in the trachea. Electronically signed by: Norman Gatlin MD 12/15/2023 05:29 PM EST RP Workstation: [id number]   DG Chest Port 1 View Result Date: 12/15/2023 CLINICAL DATA:  Stage IV lung cancer, worsening shortness of breath EXAM: PORTABLE CHEST 1 VIEW COMPARISON:  12/02/2023 FINDINGS: Single frontal view of the chest demonstrates stable catheter from right chest wall port overlying the superior vena cava. Stable dual lead pacemaker. Postsurgical changes from median sternotomy and aortic valve replacement. The cardiac silhouette remains enlarged. There is progressive opacification of the right lung compatible with asymmetric edema or infection. Dense right basilar consolidation and loculated right pleural effusion and/or pleural thickening unchanged. Increased veiling opacity at the left lung base consistent with progressive left pleural effusion. No pneumothorax. No acute bony abnormalities. IMPRESSION: 1. Progressive opacification of the right hemithorax consistent with asymmetric edema or infection. 2. Chronic right basilar consolidation and loculated right pleural  effusion and/or pleural thickening, compatible with known history of lung cancer. 3. Small left pleural effusion, increased since prior study. Electronically Signed   By: Ozell Daring M.D.   On: 12/15/2023 15:46        Scheduled Meds:  atorvastatin   20 mg Oral Daily   digoxin   125 mcg Oral Daily   feeding supplement  237 mL Oral BID BM   furosemide   40 mg Intravenous Q12H   insulin  aspart  0-15 Units Subcutaneous TID WC   insulin  aspart  0-5 Units Subcutaneous QHS   metoprolol  succinate  100 mg Oral Daily   mirtazapine   15 mg Oral QHS   pantoprazole   40 mg Oral Daily   spironolactone   25 mg Oral Daily   Continuous Infusions:  cefTRIAXone  (ROCEPHIN )  IV     doxycycline (VIBRAMYCIN) IV 100 mg (12/16/23 0635)     LOS: 0 days    Time spent: 54 minutes spent on 12/16/2023 caring for this patient face-to-face including chart review, ordering labs/tests, documenting, discussion with nursing staff, consultants, updating family and interview/physical exam    Camellia PARAS Allayah Raineri, DO [facility] Available via Epic secure chat 7am-7pm After these hours, please refer to coverage provider listed on [email or web address] 12/16/2023, 10:57 AM

## 2023-12-16 NOTE — Consult Note (Addendum)
 NAME:  [name], MRN:  [id number], DOB:  [birth year], LOS: 0 ADMISSION DATE:  12/15/2023 CHIEF COMPLAINT: Pleural effusion.  REFERRING MD :  Dr. Eric Austria  History of Present Illness:  73 year old male with past medical history of paroxysmal atrial fibrillation/flutter on Eliquis  status post left atrial appendage clipping and PFO closure, CHB status post pacemaker, severe AAS/MS status post bioprosthetic AVR/MVR, history of Enterococcus faecalis bacteremia, DM 2, CAD status post CABG, recent diagnosis of mesothelioma currently on checkpoint inhibitor presented to [facility] ED for shortness of breath. Had thoracentesis done on 28 October with removal of only 95 cc of fluid. CT chest done on admission reviewed by me.  No PE.  Bilateral loculated pleural effusion right greater than left.  Left effusion is new.  Parenchymal consolidation much more in the right lung than left.  Mediastinal lymphadenopathy.  Overall these findings suggests more progression of underlying mesothelioma with thickened visceral pleura surrounding the lung. Malignancy appears to be progressing while on checkpoint inhibitor.  Talking to the patient he has had 3 infusions of checkpoint inhibitors.  His cancer has progressed despite of that.  He did speak with his oncologist who recommend the patient being on hospice. On talking with the patient he does appear dyspneic.  He has been having progressive shortness of breath.  He has been more short of breath for last 7 days.  Has cough along with white phlegm.  Denies any fevers.  Used to smoke 15 cigarettes for 20 years. Asbestos exposure in the past.  Interim History / Subjective:  See above.  Significant Hospital Events: 12/20/2023: Pulmonary consult.  PAST MEDICAL HISTORY :   has a past medical history of BCE (basal cell epithelioma), arm, Bradycardia, Bumps on skin, CAD (coronary artery disease), Dyslipidemia, Dysrhythmia, Ectatic thoracic aorta,  Esophageal stricture, GERD (gastroesophageal reflux disease), Heart murmur, Hyperlipidemia, Incidental pulmonary nodule, > 3mm and < 8mm (10/04/2018), Inguinal hernia, Obesity, Presence of permanent cardiac pacemaker, Prostate cancer (HCC), S/P aortic valve replacement with bioprosthetic valve (10/11/2018), S/P CABG x 2 (10/11/2018), S/P mitral valve replacement with bioprosthetic valve (10/11/2018), S/P patent foramen ovale closure (10/11/2018), S/P placement of cardiac pacemaker (2020), Smoker, and Stroke (HCC).  has a past surgical history that includes Thymectomy (1976); Colonoscopy (03/2008,2004); TEE without cardioversion (N/A, 12/12/2015); RIGHT/LEFT HEART CATH AND CORONARY ANGIOGRAPHY (N/A, 09/15/2018); PACEMAKER IMPLANT (N/A, 10/17/2018); Aortic valve replacement (N/A, 10/11/2018); Mitral valve replacement (N/A, 10/11/2018); TEE without cardioversion (N/A, 10/11/2018); Clipping of atrial appendage (N/A, 10/11/2018); Repair of patent foramen ovale (N/A, 10/11/2018); Hernia repair (12/11/10); Coronary artery bypass graft (N/A, 10/11/2018); Radioactive seed implant (N/A, 04/04/2019); Cystoscopy (04/04/2019); RIGHT/LEFT HEART CATH AND CORONARY/GRAFT ANGIOGRAPHY (N/A, 05/25/2022); AV fistula placement (Left, 06/15/2022); TEE without cardioversion (N/A, 06/16/2022); Colonoscopy with propofol  (N/A, 03/22/2023); Esophagogastroduodenoscopy (egd) with propofol  (N/A, 03/22/2023); biopsy (03/22/2023); polypectomy (03/22/2023); IR IMAGING GUIDED PORT INSERTION (10/13/2023); and IR THORACENTESIS ASP PLEURAL SPACE W/IMG GUIDE (12/02/2023). Prior to Admission medications   Medication Sig Start Date End Date Taking? Authorizing Provider  amoxicillin  (AMOXIL ) 500 MG capsule Take 1 capsule (500 mg total) by mouth 3 (three) times daily. 08/09/23  Yes Dennise Kingsley, MD  atorvastatin  (LIPITOR) 20 MG tablet TAKE 1 TABLET BY MOUTH EVERY DAY 07/21/23  Yes [name], MD  digoxin  (LANOXIN ) 0.125 MG tablet TAKE 1 TABLET BY MOUTH EVERY DAY 03/02/23  Yes  Sabharwal, Aditya, DO  ELIQUIS  5 MG TABS tablet TAKE 1 TABLET BY MOUTH 2 TIMES A DAY 08/08/23  Yes Sabharwal, Aditya, DO  furosemide  (LASIX ) 20 MG tablet Take 1 tablet (20 mg total) by mouth daily. Patient is taking every other day. Patient taking differently: Take 20 mg by mouth every other day. 07/22/23  Yes Sabharwal, Aditya, DO  HYDROcodone -acetaminophen  (NORCO/VICODIN) 5-325 MG tablet Take 1 tablet by mouth every 6 (six) hours as needed for moderate pain (pain score 4-6). 12/01/23  Yes Pickenpack-Cousar, Fannie SAILOR, NP  JARDIANCE  25 MG TABS tablet TAKE 1 TABLET BY MOUTH EVERY MORNING BEFORE BREAKFAST Patient taking differently: Take 25 mg by mouth daily before breakfast. 11/11/23  Yes [name], MD  lidocaine -prilocaine  (EMLA ) cream Apply to affected area once Patient taking differently: Apply 1 Application topically as needed (port access). 10/11/23  Yes Sherrod Sherrod, MD  metoprolol  succinate (TOPROL -XL) 100 MG 24 hr tablet TAKE 1 TABLET BY MOUTH EVERY DAY (TAKE WITH OR IMMEDIATELY FOLLOWING A MEAL) Patient taking differently: Take 100 mg by mouth at bedtime. 08/11/23  Yes Sabharwal, Aditya, DO  ondansetron  (ZOFRAN ) 8 MG tablet TAKE 1 TABLET (8 MG TOTAL) BY MOUTH EVERY 8 (EIGHT) HOURS AS NEEDED FOR NAUSEA OR VOMITING. Patient taking differently: Take 8 mg by mouth See admin instructions. Take 8 mg by mouth in the morning & evening and an additional 8 mg once a day as needed for nausea or vomiting 12/05/23  Yes Sherrod Sherrod, MD  ondansetron  (ZOFRAN -ODT) 4 MG disintegrating tablet Take 1 tablet (4 mg total) by mouth every 8 (eight) hours as needed for nausea or vomiting. Patient taking differently: Take 4 mg by mouth every 8 (eight) hours as needed for nausea or vomiting (DISSOLVE ORALLY). 09/15/23  Yes Lalonde, John C, MD  polyethylene glycol powder (GLYCOLAX /MIRALAX ) 17 GM/SCOOP powder Take 17 g by mouth daily as needed for mild constipation. 05/26/22  Yes Cherlyn Labella, MD  prochlorperazine   (COMPAZINE ) 10 MG tablet TAKE 1 TABLET (10 MG TOTAL) BY MOUTH EVERY 6 (SIX) HOURS AS NEEDED FOR NAUSEA OR VOMITING. 12/05/23  Yes Sherrod Sherrod, MD  spironolactone  (ALDACTONE ) 25 MG tablet TAKE 1 TABLET BY MOUTH EVERY DAY 11/08/23  Yes Sabharwal, Aditya, DO  acetaminophen  (TYLENOL ) 325 MG tablet Take 2 tablets (650 mg total) by mouth every 4 (four) hours as needed for moderate pain. Patient not taking: Reported on 12/15/2023 06/22/22   Patsy Lenis, MD  mirtazapine  (REMERON ) 15 MG tablet TAKE 1 TABLET BY MOUTH AT BEDTIME Patient not taking: Reported on 12/15/2023 11/28/23   Sherrod Sherrod, MD  omeprazole  (PRILOSEC  OTC) 20 MG tablet Take 1 tablet (20 mg total) by mouth daily. Patient not taking: Reported on 12/15/2023 03/22/23 03/21/24  Elicia Claw, MD    FAMILY HISTORY:  family history includes Colon cancer in his mother; Lung cancer in his father.  SOCIAL HISTORY:  reports that he has quit smoking. His smoking use included cigarettes. He started smoking about 45 years ago. He has a 14 pack-year smoking history. He has never been exposed to tobacco smoke. He has never used smokeless tobacco. He reports current alcohol use of about 1.0 standard drink of alcohol per week. He reports that he does not use drugs.  REVIEW OF SYSTEMS:   Pertinent ROS as per HPI  VITAL SIGNS: Temp:  [97.3 F (36.3 C)-98.4 F (36.9 C)] 97.6 F (36.4 C) (11/07 0621) Pulse Rate:  [97-110] 108 (11/07 1051) Resp:  [14-25] 16 (11/07 0621) BP: (99-113)/(59-84) 109/65 (11/07 1051) SpO2:  [87 %-99 %] 90 % (11/07 0621) Weight:  [74.8 kg] 74.8 kg (11/06 1808)  PHYSICAL EXAMINATION: General: Elderly male in  respiratory distress. Lungs: Diminished breath sounds on the right. Heart: regular rate rhythm, no murmur appreciated.  Abdomen: non tender, non distended. Normal BS.  Neuro: Alert and oriented x 3.   Recent Labs  Lab 12/15/23 1435  NA 136  K 3.6  CL 97*  CO2 26  BUN 13  CREATININE 0.80  GLUCOSE  101*   Recent Labs  Lab 12/15/23 1435  HGB 15.9  HCT 54.0*  WBC 6.1  PLT 172   CT Chest W Contrast Result Date: 12/15/2023 EXAM: CT CHEST WITH CONTRAST 12/15/2023 04:41:00 PM TECHNIQUE: CT of the chest was performed with the administration of 75 mL of iohexol  (OMNIPAQUE ) 300 MG/ML solution. Multiplanar reformatted images are provided for review. Automated exposure control, iterative reconstruction, and/or weight based adjustment of the mA/kV was utilized to reduce the radiation dose to as low as reasonably achievable. COMPARISON: Chest x-ray and CT 09/26/2023 and CT chest 09/13/2023. CLINICAL HISTORY: Worsening shortness of breath and stage 4 lung cancer. Pleural effusion, increasing edema vs infection. FINDINGS: MEDIASTINUM: Aortic valve replacement. Coronary artery and aortic atherosclerotic calcification. Cardiomegaly. Reflux of contrast into the IVC and hepatic veins compatible with elevated right heart pressures. Sternotomy. Debris in the trachea, aspiration risk. LYMPH NODES: Progressive mediastinal and confluent right hilar adenopathy. For example, the pretracheal node on series 2 image 49 measures 27 mm, previously 12 mm. The right hilar adenopathy is confluent with the pleural thickening. LUNGS AND PLEURA: Peribronchovascular consolidation in the right lower lobe and right middle lobe. Ground glass opacities in the left lower lobe. Findings may be due to bronchopneumonia, aspiration, or progression of malignancy. Unchanged 7 mm nodule in the left apex (series 4 image 28). Diffuse nodular thickening of the pleura throughout the right hemithorax extending into both the major and minor fissure has progressed compared to 09/26/2023. Increased size of the loculated moderate right pleural effusion. There is a new small loculated left pleural effusion with fluid extending into the fissure. There is new nodular pleural thickening on the left near the apex. No pneumothorax. SOFT TISSUES/BONES: Acute or  subacute fractures of the posterior left 9-12 ribs at the costovertebral junction. Additional mildly displaced fractures of the left posterior 10th and 11th ribs and nondisplaced fracture of the posterior left 9th rib. These are new since 09/26/2023. No definite underlying osseous lesion to suggest pathologic fracture. There is a new sclerotic lesion in the right lateral 7th rib with associated cortical buckle fracture suspicious for pathologic fracture (series 4 image 92). Chronic compression fractures of multiple thoracic vertebral bodies. Left chest wall pacemaker. UPPER ABDOMEN: Limited images of the upper abdomen demonstrates no acute abnormality. IMPRESSION: 1. Progressive metastatic disease throughout the right hemithorax Increased size of the loculated pleural effusion on the right. New nodular pleural thickening and new likely malignant pleural effusion on the left. 2. Airspace opacities in the bilateral lower lobes may be due to bronchopneumonia, aspiration, progressive malignancy, or a combination. 3. New sclerotic lesion in the right lateral 7th rib with associated cortical buckle fracture, suspicious for pathologic fracture. 4. Acute/subacute fractures of the posterior left 9th12th ribs (including mildly displaced 10th11th and nondisplaced 9th), new since 09/26/23, without definite underlying osseous lesion to suggest pathologic fracture. 5. Debris in the trachea. Electronically signed by: Norman Gatlin MD 12/15/2023 05:29 PM EST RP Workstation: [id number]   DG Chest Port 1 View Result Date: 12/15/2023 CLINICAL DATA:  Stage IV lung cancer, worsening shortness of breath EXAM: PORTABLE CHEST 1 VIEW COMPARISON:  12/02/2023 FINDINGS: Single frontal  view of the chest demonstrates stable catheter from right chest wall port overlying the superior vena cava. Stable dual lead pacemaker. Postsurgical changes from median sternotomy and aortic valve replacement. The cardiac silhouette remains enlarged. There  is progressive opacification of the right lung compatible with asymmetric edema or infection. Dense right basilar consolidation and loculated right pleural effusion and/or pleural thickening unchanged. Increased veiling opacity at the left lung base consistent with progressive left pleural effusion. No pneumothorax. No acute bony abnormalities. IMPRESSION: 1. Progressive opacification of the right hemithorax consistent with asymmetric edema or infection. 2. Chronic right basilar consolidation and loculated right pleural effusion and/or pleural thickening, compatible with known history of lung cancer. 3. Small left pleural effusion, increased since prior study. Electronically Signed   By: Ozell Daring M.D.   On: 12/15/2023 15:46    STUDIES:  CXR/CT scan/ECHO  See above.  ASSESSMENT / PLAN: Malignant mesothelioma: Suspected bilateral malignant pleural effusion from underlying mesothelioma: Acute hypoxic respiratory failure due to above: Dyspnea: Pneumonia: The patient has progression of mesothelioma which was diagnosed with pleural biopsy in August 2025.  He has received 3 infusions of ipilimumab  without improvement. Oncology following recommend palliative care. Had thoracentesis on the right in October with removal of only 100 cc of fluid.  Reportedly the fluid was very thick for further removal per wife. BNP 6900.   Plan: - Agree with palliative care. - Will plan for chest tube placement on Sunday or Monday [failed thoracentesis due to cellularity] to see if removal of fluid will symptomatically help the patient.  However suspect that the pleural fluid might be too cellular for future placement of IPC. - Less likely the patient has pneumonia.  Will complete 5-7-day course of antibiotic per primary team. - trial of diuresis.  - Will come back and do bedside ultrasound for potential pocket for chest tube placement.   I personally spent a total of 55 minutes in the care of the patient today  including preparing to see the patient, getting/reviewing separately obtained history, performing a medically appropriate exam/evaluation, counseling and educating, documenting clinical information in the EHR, and independently interpreting results.   Sammi JONETTA Fredericks, MD Pulmonary, Critical Care & Sleep Medicine [facility] Pager: [phone]  12/16/2023, 12:39 PM

## 2023-12-16 NOTE — Plan of Care (Signed)
  Problem: Coping: Goal: Ability to adjust to condition or change in health will improve Outcome: Progressing   Problem: Fluid Volume: Goal: Ability to maintain a balanced intake and output will improve Outcome: Progressing   Problem: Metabolic: Goal: Ability to maintain appropriate glucose levels will improve Outcome: Progressing   Problem: Education: Goal: Knowledge of General Education information will improve Description: Including pain rating scale, medication(s)/side effects and non-pharmacologic comfort measures Outcome: Progressing

## 2023-12-16 NOTE — Plan of Care (Signed)

## 2023-12-16 NOTE — Progress Notes (Signed)
 I did a bedside ultrasound and I believe there will not be much utility in doing the chest tube.  The area is very cellular with septations.  Please see the POCUS images and POCUS note.  Even if I place the chest tube in I will have to give lytics to this patient which potentially can cause him to pain.  So overall the utility of the chest tube is questionable  Pulm will sign off. Please call with questions.   Sammi Fredericks, MD.

## 2023-12-16 NOTE — Progress Notes (Signed)
 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    PRIOR THERAPY: Systemic immunotherapy with ipilimumab  1 Mg/KG every 6 weeks in addition to nivolumab  360 Mg IV every 3 weeks.  Status post day 1 of cycle #1.   CURRENT THERAPY: None  Subjective: The patient is seen and examined today.  I discussed my care plan with the patient and his wife who was available by phone during the visit.  This is a very pleasant 73 years old white male diagnosed with a stage III right malignant pleural mesothelioma in August 2025.  He started palliative immunotherapy with ipilimumab  and nivolumab  status post 1 dose.  Unfortunately the patient was admitted to the hospital yesterday with worsening shortness of breath.  He has significant fatigue and weakness.  He had CT scan of the chest performed yesterday that showed progressive metastatic disease throughout the right hemithorax with increased size of loculated pleural effusion on the right and new nodular pleural thickening and new likely malignant pleural effusion on the left.  He also has airspace opacity in the bilateral lower lobes suspicious for bronchopneumonia, aspiration or progressive malignancy in addition to new sclerotic lesions in the right lateral seventh rib.  Objective: Vital signs in last 24 hours: Temp:  [97.3 F (36.3 C)-98.4 F (36.9 C)] 97.6 F (36.4 C) (11/07 0621) Pulse Rate:  [97-110] 108 (11/07 1051) Resp:  [14-25] 16 (11/07 0621) BP: (99-113)/(59-84) 109/65 (11/07 1051) SpO2:  [87 %-99 %] 90 % (11/07 0621) Weight:  [165 lb (74.8 kg)] 165 lb (74.8 kg) (11/06 1808)  Intake/Output from previous day: 11/06 0701 - 11/07 0700 In: 250 [IV Piggyback:250] Out: 1950 [Urine:1950] Intake/Output this shift: Total I/O In: 60 [P.O.:60] Out: -   General appearance: alert, cooperative,  fatigued, and mild distress Resp: diminished breath sounds bilaterally and dullness to percussion bilaterally Cardio: regular rate and rhythm, S1, S2 normal, no murmur, click, rub or gallop GI: soft, non-tender; bowel sounds normal; no masses,  no organomegaly Extremities: extremities normal, atraumatic, no cyanosis or edema  Lab Results:  Recent Labs    12/15/23 1435  WBC 6.1  HGB 15.9  HCT 54.0*  PLT 172   BMET Recent Labs    12/15/23 1435  NA 136  K 3.6  CL 97*  CO2 26  GLUCOSE 101*  BUN 13  CREATININE 0.80  CALCIUM  9.2    Studies/Results: CT Chest W Contrast Result Date: 12/15/2023 EXAM: CT CHEST WITH CONTRAST 12/15/2023 04:41:00 PM TECHNIQUE: CT of the chest was performed with the administration of 75 mL of iohexol  (OMNIPAQUE ) 300 MG/ML solution. Multiplanar reformatted images are provided for review. Automated exposure control, iterative reconstruction, and/or weight based adjustment of the mA/kV was utilized to reduce the radiation dose to as low as reasonably achievable. COMPARISON: Chest x-ray and CT 09/26/2023 and CT chest 09/13/2023. CLINICAL HISTORY: Worsening shortness of breath and stage 4 lung cancer. Pleural effusion, increasing edema vs infection. FINDINGS: MEDIASTINUM: Aortic valve replacement. Coronary artery and aortic atherosclerotic calcification. Cardiomegaly. Reflux of contrast into the IVC and hepatic veins compatible with elevated right heart pressures. Sternotomy. Debris in the trachea, aspiration risk. LYMPH NODES: Progressive mediastinal and confluent right hilar adenopathy. For example, the pretracheal node on series 2 image 49 measures 27 mm, previously 12 mm. The right hilar adenopathy is confluent with the pleural thickening. LUNGS AND PLEURA: Peribronchovascular consolidation in the right lower lobe and  right middle lobe. Ground glass opacities in the left lower lobe. Findings may be due to bronchopneumonia, aspiration, or progression of malignancy.  Unchanged 7 mm nodule in the left apex (series 4 image 28). Diffuse nodular thickening of the pleura throughout the right hemithorax extending into both the major and minor fissure has progressed compared to 09/26/2023. Increased size of the loculated moderate right pleural effusion. There is a new small loculated left pleural effusion with fluid extending into the fissure. There is new nodular pleural thickening on the left near the apex. No pneumothorax. SOFT TISSUES/BONES: Acute or subacute fractures of the posterior left 9-12 ribs at the costovertebral junction. Additional mildly displaced fractures of the left posterior 10th and 11th ribs and nondisplaced fracture of the posterior left 9th rib. These are new since 09/26/2023. No definite underlying osseous lesion to suggest pathologic fracture. There is a new sclerotic lesion in the right lateral 7th rib with associated cortical buckle fracture suspicious for pathologic fracture (series 4 image 92). Chronic compression fractures of multiple thoracic vertebral bodies. Left chest wall pacemaker. UPPER ABDOMEN: Limited images of the upper abdomen demonstrates no acute abnormality. IMPRESSION: 1. Progressive metastatic disease throughout the right hemithorax Increased size of the loculated pleural effusion on the right. New nodular pleural thickening and new likely malignant pleural effusion on the left. 2. Airspace opacities in the bilateral lower lobes may be due to bronchopneumonia, aspiration, progressive malignancy, or a combination. 3. New sclerotic lesion in the right lateral 7th rib with associated cortical buckle fracture, suspicious for pathologic fracture. 4. Acute/subacute fractures of the posterior left 9th12th ribs (including mildly displaced 10th11th and nondisplaced 9th), new since 09/26/23, without definite underlying osseous lesion to suggest pathologic fracture. 5. Debris in the trachea. Electronically signed by: Norman Gatlin MD 12/15/2023  05:29 PM EST RP Workstation: [id number]   DG Chest Port 1 View Result Date: 12/15/2023 CLINICAL DATA:  Stage IV lung cancer, worsening shortness of breath EXAM: PORTABLE CHEST 1 VIEW COMPARISON:  12/02/2023 FINDINGS: Single frontal view of the chest demonstrates stable catheter from right chest wall port overlying the superior vena cava. Stable dual lead pacemaker. Postsurgical changes from median sternotomy and aortic valve replacement. The cardiac silhouette remains enlarged. There is progressive opacification of the right lung compatible with asymmetric edema or infection. Dense right basilar consolidation and loculated right pleural effusion and/or pleural thickening unchanged. Increased veiling opacity at the left lung base consistent with progressive left pleural effusion. No pneumothorax. No acute bony abnormalities. IMPRESSION: 1. Progressive opacification of the right hemithorax consistent with asymmetric edema or infection. 2. Chronic right basilar consolidation and loculated right pleural effusion and/or pleural thickening, compatible with known history of lung cancer. 3. Small left pleural effusion, increased since prior study. Electronically Signed   By: Ozell Daring M.D.   On: 12/15/2023 15:46    Medications: I have reviewed the patient's current medications.   Assessment/Plan: This is a very pleasant 73 years old white male recently diagnosed with a stage III right malignant pleural mesothelioma in August 2025.  He started treatment with immunotherapy with ipilimumab  and nivolumab  status post 1 dose. Unfortunately patient was found on recent imaging studies to have progressive disease in the right and left lung as well as bone metastasis. I had a lengthy discussion with the patient and his wife about his current condition.  I do not think he is benefiting from the current treatment with immunotherapy and his disease has been progressive on a rapid pace. I recommended for  the patient and  his wife to consider palliative care and hospice at this point.  He may benefit from drainage of the right pleural effusion for mild relief of the shortness of breath but it is expected to reaccumulate again. I will cancel his future treatment with immunotherapy but the patient will be a good candidate for palliative care and hospice. Thank you for taking good care of Mr. Prill.  Please call if you have any questions.   LOS: 0 days    Sherrod MARLA Sherrod 12/16/2023

## 2023-12-17 ENCOUNTER — Other Ambulatory Visit (HOSPITAL_COMMUNITY): Payer: Self-pay

## 2023-12-17 ENCOUNTER — Encounter: Payer: Self-pay | Admitting: Internal Medicine

## 2023-12-17 DIAGNOSIS — I509 Heart failure, unspecified: Secondary | ICD-10-CM | POA: Diagnosis not present

## 2023-12-17 DIAGNOSIS — C459 Mesothelioma, unspecified: Secondary | ICD-10-CM | POA: Diagnosis not present

## 2023-12-17 DIAGNOSIS — J9601 Acute respiratory failure with hypoxia: Secondary | ICD-10-CM | POA: Diagnosis not present

## 2023-12-17 DIAGNOSIS — Z711 Person with feared health complaint in whom no diagnosis is made: Secondary | ICD-10-CM

## 2023-12-17 DIAGNOSIS — Z515 Encounter for palliative care: Secondary | ICD-10-CM | POA: Diagnosis not present

## 2023-12-17 DIAGNOSIS — Z789 Other specified health status: Secondary | ICD-10-CM

## 2023-12-17 DIAGNOSIS — Z7189 Other specified counseling: Secondary | ICD-10-CM

## 2023-12-17 DIAGNOSIS — Z66 Do not resuscitate: Secondary | ICD-10-CM

## 2023-12-17 LAB — BASIC METABOLIC PANEL WITH GFR
Anion gap: 13 (ref 5–15)
BUN: 13 mg/dL (ref 8–23)
CO2: 32 mmol/L (ref 22–32)
Calcium: 8.7 mg/dL — ABNORMAL LOW (ref 8.9–10.3)
Chloride: 95 mmol/L — ABNORMAL LOW (ref 98–111)
Creatinine, Ser: 0.9 mg/dL (ref 0.61–1.24)
GFR, Estimated: >60 mL/min (ref >60)
Glucose, Bld: 102 mg/dL — ABNORMAL HIGH (ref 70–99)
Potassium: 3.1 mmol/L — ABNORMAL LOW (ref 3.5–5.1)
Sodium: 141 mmol/L (ref 135–145)

## 2023-12-17 LAB — CBC
HCT: 51.9 % (ref 39.0–52.0)
Hemoglobin: 15.5 g/dL (ref 13.0–17.0)
MCH: 24.7 pg — ABNORMAL LOW (ref 26.0–34.0)
MCHC: 29.9 g/dL — ABNORMAL LOW (ref 30.0–36.0)
MCV: 82.6 fL (ref 80.0–100.0)
Platelets: 149 K/uL — ABNORMAL LOW (ref 150–400)
RBC: 6.28 MIL/uL — ABNORMAL HIGH (ref 4.22–5.81)
RDW: 23.9 % — ABNORMAL HIGH (ref 11.5–15.5)
WBC: 8.2 K/uL (ref 4.0–10.5)
nRBC: 0 % (ref 0.0–0.2)

## 2023-12-17 LAB — GLUCOSE, CAPILLARY: Glucose-Capillary: 93 mg/dL (ref 70–99)

## 2023-12-17 LAB — PRO BRAIN NATRIURETIC PEPTIDE: Pro Brain Natriuretic Peptide: 8828 pg/mL — ABNORMAL HIGH (ref <300.0)

## 2023-12-17 MED ORDER — SCOPOLAMINE 1 MG/3DAYS TD PT72
1.0000 | MEDICATED_PATCH | TRANSDERMAL | 0 refills | Status: DC
  Filled 2023-12-17: qty 10, 30d supply, fill #0

## 2023-12-17 MED ORDER — MORPHINE SULFATE (CONCENTRATE) 20 MG/ML PO SOLN
10.0000 mg | ORAL | 0 refills | Status: DC | PRN
  Filled 2023-12-17: qty 90, 30d supply, fill #0

## 2023-12-17 MED ORDER — HYOSCYAMINE SULFATE 0.125 MG SL SUBL
0.1250 mg | SUBLINGUAL_TABLET | SUBLINGUAL | 0 refills | Status: DC | PRN
  Filled 2023-12-17: qty 42, 7d supply, fill #0

## 2023-12-17 MED ORDER — LORAZEPAM 2 MG/ML PO CONC
0.6000 mg | Freq: Four times a day (QID) | ORAL | 0 refills | Status: DC | PRN
  Filled 2023-12-17: qty 30, 25d supply, fill #0

## 2023-12-17 MED ORDER — HEPARIN SOD (PORK) LOCK FLUSH 100 UNIT/ML IV SOLN
500.0000 [IU] | Freq: Once | INTRAVENOUS | Status: AC
  Administered 2023-12-17: 500 [IU] via INTRAVENOUS
  Filled 2023-12-17 (×2): qty 5

## 2023-12-17 NOTE — Progress Notes (Signed)
 Patient refused CBG and any subsequent insulin  due to discharge. Education provided.

## 2023-12-17 NOTE — Progress Notes (Signed)
 Discharge meds in a secure bag delivered to patient and family in room. At this time, patient will defer scopolamine patches due to cost.

## 2023-12-17 NOTE — Discharge Summary (Signed)
 Physician Discharge Summary  AMR STURTEVANT FMW:[id number] DOB: [birth year] DOA: 12/15/2023  PCP: [name], MD  Admit date: 12/15/2023 Discharge date: 12/17/2023  Admitted From: Home Disposition: Home with hospice  Recommendations for Outpatient Follow-up:  Follow up with hospice provider on discharge  Equipment/Devices: Has home oxygen  already set up  Discharge Condition: Guarded, overall long-term prognosis poor CODE STATUS: DNR Diet recommendation: For feeds as tolerates  History of present illness:  [name] is a 73 y.o. male with past medical history significant for paroxysmal atrial fibrillation/flutter on Eliquis  s/p left atrial appendage clipping and PFO closure, CHB s/p PPM, Hx severe AS/MS s/p bioprosthetic AVR/MVR, Hx Enterococcus faecalis bacteremia s/p IV abx, DM2, HTN, HLD, CAD s/p CABG x 2, recent diagnosis of mesothelioma with new started on immunotherapy who presented to [facility] ED on 12/15/2023 with worsening shortness of breath.  Has recently started palliative immunotherapy, not a candidate for surgery or chemotherapy.  He has had a known pleural effusion with thoracentesis attempted on December 02, 2023 with only a small amount of fluid, 95 mL removed.  Additionally, family noticed some choking episodes while eating.  Given his progressive symptoms, he sought further care in the ED.   In the ED, temperature 97.3 F, HR 108, RR 23, BP 106/76, SpO2 98% on room air and placed on 2 L nasal cannula with SpO2 94%.  VBG 7.41, P CO2 45, pCO2 49.  WBC 6.1, hemoglobin 15.9, platelet count 172.  Sodium 136, potassium 3.6, chloride 97, CO2 26, glucose 101, BUN 13, creatinine 0.80.  AST 38, ALT 12, total bilirubin 1.3.  BNP elevated 6050.  High sensitive troponin 30 followed by 28.  Digoxin  level 1.1.  Chest x-ray with progressive opacification right hemithorax consistent with asymmetric edema versus infection, chronic right basilar consolidation and loculated right pleural  effusion and/or pleural thickening compatible with known history of lung cancer, small left pleural effusion, increased since prior study.  CT chest with contrast with progressive metastatic disease throughout the right hemithorax, increased size of loculated pleural effusion on the right, new nodular pleural thickening and new likely malignant pleural effusion on the left, airspace opacities in the bilateral lower lobes may be due to bronchopneumonia, aspiration, progressive malignancy or combination; new sclerotic lesion right lateral seventh rib with associated cortical buckle fracture, suspicious for pathologic fracture; acute/subacute fractures posterior left 9th through 12th ribs including mildly displaced 10th and 11th and nondisplaced ninth rib, new since 09/26/2023 without definite underlying osseous lesion to suggest pathologic fracture, debris's in the trachea.  Patient was given IV Lasix , started on empiric antibiotics for concern of pneumonia.  TRH consulted for admission for further evaluation and management.  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  Follows with medical oncology outpatient, Dr. Gatha.  Recently started on immunotherapy with ipilimumab  and nivolumab .  Patient with progressive dyspnea with known right pleural effusion with recent thoracentesis attempt with only 95 mL of fluid able to be removed.  Also now with new left small pleural effusion both likely malignant in the setting of known history of mesothelioma.  Imaging notable for progression of disease.  Medical oncology and pulmonology followed during hospital course.  Seen by pulmonology for consideration of chest tube  placement, given severe cellularity and loculations, unlikely  to benefit from significant improvement and pulmonology as well as oncology recommending transitioning to hospice.  Discharging home with home hospice.   Aspiration pneumonia vs bronchopneumonia: Ruled out CT chest with contrast with findings of debris's in the trachea, airspace opacities bilateral lower lobes concerning for bronchopneumonia versus aspiration versus progressive mesothelioma.  Patient is afebrile without leukocytosis.  Initially started on antibiotics, discontinued as likely due to progressive mesothelioma as above.   Dysphagia Patient spouse reports that patient has had some difficulty with swallowing solids recently.  Imaging notable for debris's in the trachea, possible aspiration pneumonia.   Paroxysmal atrial fibrillation/flutter on Eliquis  s/p left atrial appendage clipping and PFO closure CHB s/p PPM Hx severe AS/MS s/p bioprosthetic AVR/MVR HTN CAD s/p CABG x 2 Follows with cardiology outpatient.  Continue digoxin .  Discontinue metoprolol  and spironolactone .  May continue Eliquis  if consistent with goals of care   HLD Atorvastatin  20 mg p.o. daily   DM2 At baseline on Jardiance  25 mg p.o. daily.  Hemoglobin A1c 5.1 on 12/15/2023, well-controlled.   Hx Enterococcus faecalis bacteremia Completed 6 weeks course of IV antibiotics.  Discharge Diagnoses:  Principal Problem:   Acute respiratory failure with hypoxia [facility]) Active Problems:   Acute on chronic respiratory failure with hypoxia [facility])   Hospice care patient    Discharge Instructions  Discharge Instructions     Diet - low sodium heart healthy   Complete by: As directed    Increase activity slowly   Complete by: As directed       Allergies as of 12/17/2023       Reactions   Losartan  Rash        Medication List     STOP taking these medications    acetaminophen  325 MG tablet Commonly known as: TYLENOL    amoxicillin  500 MG  capsule Commonly known as: AMOXIL    atorvastatin  20 MG tablet Commonly known as: LIPITOR   HYDROcodone -acetaminophen  5-325 MG tablet Commonly known as: NORCO/VICODIN   Jardiance  25 MG Tabs tablet Generic drug: empagliflozin    metoprolol  succinate 100 MG 24 hr tablet Commonly known as: TOPROL -XL   mirtazapine  15 MG tablet Commonly known as: REMERON    omeprazole  20 MG tablet Commonly known as: PriLOSEC  OTC   spironolactone  25 MG tablet Commonly known as: ALDACTONE        TAKE these medications    digoxin  0.125 MG tablet Commonly known as: LANOXIN  TAKE 1 TABLET BY MOUTH EVERY DAY   Eliquis  5 MG Tabs tablet Generic drug: apixaban  TAKE 1 TABLET BY MOUTH 2 TIMES A DAY   furosemide  20 MG tablet Commonly known as: LASIX  Take 1 tablet (20 mg total) by mouth daily. Patient is taking every other day. What changed:  when to take this additional instructions   hyoscyamine 0.125 MG SL tablet Commonly known as: LEVSIN SL Place 1 tablet (0.125 mg total) under the tongue every 4 (four) hours as needed (excess oral secretions).   lidocaine -prilocaine  cream Commonly known as: EMLA  Apply to affected area once What changed:  how much to take how to take this when to take this reasons to take this additional instructions   LORazepam 2 MG/ML concentrated solution Commonly known as: ATIVAN Take 0.3 mLs (0.6 mg total) by mouth every 6 (six) hours as needed for anxiety.   morphine  20 MG/ML concentrated solution Commonly known as: ROXANOL Take 0.5 mLs (10 mg total) by mouth every 4 (four) hours as needed for severe pain (pain score 7-10), shortness of breath or anxiety. May  give sublingually if needed.   ondansetron  4 MG disintegrating tablet Commonly known as: ZOFRAN -ODT Take 1 tablet (4 mg total) by mouth every 8 (eight) hours as needed for nausea or vomiting. What changed: reasons to take this   ondansetron  8 MG tablet Commonly known as: ZOFRAN  TAKE 1 TABLET (8 MG  TOTAL) BY MOUTH EVERY 8 (EIGHT) HOURS AS NEEDED FOR NAUSEA OR VOMITING. What changed:  when to take this additional instructions   polyethylene glycol powder 17 GM/SCOOP powder Commonly known as: GLYCOLAX /MIRALAX  Take 17 g by mouth daily as needed for mild constipation.   prochlorperazine  10 MG tablet Commonly known as: COMPAZINE  TAKE 1 TABLET (10 MG TOTAL) BY MOUTH EVERY 6 (SIX) HOURS AS NEEDED FOR NAUSEA OR VOMITING.   scopolamine 1 MG/3DAYS Commonly known as: TRANSDERM-SCOP Place 1 patch (1 mg total) onto the skin every 3 (three) days.        Follow-up Information     [facility] Follow up.   Contact information: [address] [place]  [id number] [phone]               Allergies  Allergen Reactions   Losartan  Rash    Consultations: Pulmonology Medical oncology   Procedures/Studies: CT Chest W Contrast Result Date: 12/15/2023 EXAM: CT CHEST WITH CONTRAST 12/15/2023 04:41:00 PM TECHNIQUE: CT of the chest was performed with the administration of 75 mL of iohexol  (OMNIPAQUE ) 300 MG/ML solution. Multiplanar reformatted images are provided for review. Automated exposure control, iterative reconstruction, and/or weight based adjustment of the mA/kV was utilized to reduce the radiation dose to as low as reasonably achievable. COMPARISON: Chest x-ray and CT 09/26/2023 and CT chest 09/13/2023. CLINICAL HISTORY: Worsening shortness of breath and stage 4 lung cancer. Pleural effusion, increasing edema vs infection. FINDINGS: MEDIASTINUM: Aortic valve replacement. Coronary artery and aortic atherosclerotic calcification. Cardiomegaly. Reflux of contrast into the IVC and hepatic veins compatible with elevated right heart pressures. Sternotomy. Debris in the trachea, aspiration risk. LYMPH NODES: Progressive mediastinal and confluent right hilar adenopathy. For example, the pretracheal node on series 2 image 49 measures 27 mm, previously 12  mm. The right hilar adenopathy is confluent with the pleural thickening. LUNGS AND PLEURA: Peribronchovascular consolidation in the right lower lobe and right middle lobe. Ground glass opacities in the left lower lobe. Findings may be due to bronchopneumonia, aspiration, or progression of malignancy. Unchanged 7 mm nodule in the left apex (series 4 image 28). Diffuse nodular thickening of the pleura throughout the right hemithorax extending into both the major and minor fissure has progressed compared to 09/26/2023. Increased size of the loculated moderate right pleural effusion. There is a new small loculated left pleural effusion with fluid extending into the fissure. There is new nodular pleural thickening on the left near the apex. No pneumothorax. SOFT TISSUES/BONES: Acute or subacute fractures of the posterior left 9-12 ribs at the costovertebral junction. Additional mildly displaced fractures of the left posterior 10th and 11th ribs and nondisplaced fracture of the posterior left 9th rib. These are new since 09/26/2023. No definite underlying osseous lesion to suggest pathologic fracture. There is a new sclerotic lesion in the right lateral 7th rib with associated cortical buckle fracture suspicious for pathologic fracture (series 4 image 92). Chronic compression fractures of multiple thoracic vertebral bodies. Left chest wall pacemaker. UPPER ABDOMEN: Limited images of the upper abdomen demonstrates no acute abnormality. IMPRESSION: 1. Progressive metastatic disease throughout the right hemithorax Increased size of the loculated pleural effusion on  the right. New nodular pleural thickening and new likely malignant pleural effusion on the left. 2. Airspace opacities in the bilateral lower lobes may be due to bronchopneumonia, aspiration, progressive malignancy, or a combination. 3. New sclerotic lesion in the right lateral 7th rib with associated cortical buckle fracture, suspicious for pathologic fracture.  4. Acute/subacute fractures of the posterior left 9th12th ribs (including mildly displaced 10th11th and nondisplaced 9th), new since 09/26/23, without definite underlying osseous lesion to suggest pathologic fracture. 5. Debris in the trachea. Electronically signed by: Norman Gatlin MD 12/15/2023 05:29 PM EST RP Workstation: [id number]   DG Chest Port 1 View Result Date: 12/15/2023 CLINICAL DATA:  Stage IV lung cancer, worsening shortness of breath EXAM: PORTABLE CHEST 1 VIEW COMPARISON:  12/02/2023 FINDINGS: Single frontal view of the chest demonstrates stable catheter from right chest wall port overlying the superior vena cava. Stable dual lead pacemaker. Postsurgical changes from median sternotomy and aortic valve replacement. The cardiac silhouette remains enlarged. There is progressive opacification of the right lung compatible with asymmetric edema or infection. Dense right basilar consolidation and loculated right pleural effusion and/or pleural thickening unchanged. Increased veiling opacity at the left lung base consistent with progressive left pleural effusion. No pneumothorax. No acute bony abnormalities. IMPRESSION: 1. Progressive opacification of the right hemithorax consistent with asymmetric edema or infection. 2. Chronic right basilar consolidation and loculated right pleural effusion and/or pleural thickening, compatible with known history of lung cancer. 3. Small left pleural effusion, increased since prior study. Electronically Signed   By: Ozell Daring M.D.   On: 12/15/2023 15:46   DG Chest 1 View Result Date: 12/02/2023 EXAM: 1 VIEW(S) XRAY OF THE CHEST 12/02/2023 10:41:00 AM COMPARISON: 10/26/2023 CLINICAL HISTORY: Pleural effusion on right [id number]. Reason for exam: post Pleural effusion on right ; 95 mL taken off right lung. History of mesothelioma. Status post ultrasound guided right sided paracentesis this morning. FINDINGS: LINES, TUBES AND DEVICES: Right IJ port catheter in place  with tip overlying cavoatrial junction. Stable left chest wall pacemaker. CABG markers noted. Sternotomy wires noted. LUNGS AND PLEURA: Stable moderate right pleural effusion. Stable right lung opacities. New small left pleural effusion. No pulmonary edema. HEART AND MEDIASTINUM: Aortic atherosclerosis. No acute abnormality of the cardiac and mediastinal silhouettes. BONES AND SOFT TISSUES: No acute osseous abnormality. IMPRESSION: 1. No pneumothorax. Stable moderate right lung opacities. 2. New small left pleural effusion. Electronically signed by: Helayne Hurst MD 12/02/2023 10:54 AM EDT RP Workstation: [id number]   IR THORACENTESIS ASP PLEURAL SPACE W/IMG GUIDE Result Date: 12/02/2023 INDICATION: History of mesothelioma, right pleural effusion. Consult for therapeutic right thoracentesis. EXAM: ULTRASOUND GUIDED RIGHT THORACENTESIS MEDICATIONS: 9 mL 1% lidocaine  COMPLICATIONS: None immediate. PROCEDURE: An ultrasound guided thoracentesis was thoroughly discussed with the patient and questions answered. The benefits, risks, alternatives and complications were also discussed. The patient understands and wishes to proceed with the procedure. Written consent was obtained. Ultrasound was performed to localize and mark an adequate pocket of fluid in the right chest. Fluid appeared complex and thick. The area was then prepped and draped in the normal sterile fashion. 1% Lidocaine  was used for local anesthesia. Under ultrasound guidance a 6 Fr Safe-T-Centesis catheter was introduced. Thoracentesis was performed. The catheter was removed and a dressing applied. FINDINGS: A total of approximately 95 mL of thick, dark amber fluid was removed. IMPRESSION: Successful ultrasound guided right thoracentesis yielding 95 mL of pleural fluid. Performed and dictated by Kimble Clas, PA-C Electronically Signed   By: CHRISTELLA.  Shick  M.D.   On: 12/02/2023 10:49     Subjective: Patient seen examined bedside, lying in bed.  Multiple  family members including wife present.  Patient electing to go home under hospice services.  No other questions or concerns at this time.  Discharge Exam: Vitals:   12/17/23 0450 12/17/23 0828  BP: 95/61 (!) 93/58  Pulse: (!) 59 (!) 122  Resp: 14   Temp: 97.8 F (36.6 C)   SpO2: 92%    Vitals:   12/16/23 2144 12/16/23 2144 12/17/23 0450 12/17/23 0828  BP: 104/72 104/72 95/61 (!) 93/58  Pulse: (!) 112 (!) 112 (!) 59 (!) 122  Resp:  15 14   Temp: (!) 97.5 F (36.4 C) (!) 97.5 F (36.4 C) 97.8 F (36.6 C)   TempSrc: Axillary Oral Oral   SpO2:  (!) 85% 92%   Weight:      Height:        Physical Exam: GEN: NAD, alert and oriented x 3, chronically ill appearance HEENT: NCAT, PERRL, EOMI, sclera clear, MMM PULM: Decreased aeration/breath sounds from right base to right hemithorax, no wheezing/crackles, normal respiratory effort with accessory muscle use, on 4 L nasal cannula CV: RRR w/o M/G/R GI: abd soft, NTND, + BS MSK: no peripheral edema, moves all extremities independently NEURO: No focal neurological deficit PSYCH: normal mood/affect Integumentary: No concerning rashes/lesions/wounds no exposed skin surfaces    The results of significant diagnostics from this hospitalization (including imaging, microbiology, ancillary and laboratory) are listed below for reference.     Microbiology: No results found for this or any previous visit (from the past 240 hours).   Labs: BNP (last 3 results) Recent Labs    01/20/23 0927 04/09/23 1255 06/30/23 1416  BNP 247.6* 188.8* 422.4*   Basic Metabolic Panel: Recent Labs  Lab 12/15/23 1435 12/17/23 0445  NA 136 141  K 3.6 3.1*  CL 97* 95*  CO2 26 32  GLUCOSE 101* 102*  BUN 13 13  CREATININE 0.80 0.90  CALCIUM  9.2 8.7*   Liver Function Tests: Recent Labs  Lab 12/15/23 1435  AST 38  ALT 12  ALKPHOS 169*  BILITOT 1.3*  PROT 7.5  ALBUMIN  3.2*   No results for input(s): LIPASE, AMYLASE in the last 168  hours. No results for input(s): AMMONIA in the last 168 hours. CBC: Recent Labs  Lab 12/15/23 1435 12/17/23 0445  WBC 6.1 8.2  HGB 15.9 15.5  HCT 54.0* 51.9  MCV 81.7 82.6  PLT 172 149*   Cardiac Enzymes: No results for input(s): CKTOTAL, CKMB, CKMBINDEX, TROPONINI in the last 168 hours. BNP: Invalid input(s): POCBNP CBG: Recent Labs  Lab 12/16/23 0742 12/16/23 1120 12/16/23 1654 12/16/23 2147 12/17/23 0713  GLUCAP 101* 109* 121* 93 93   D-Dimer No results for input(s): DDIMER in the last 72 hours. Hgb A1c Recent Labs    12/15/23 1728  HGBA1C 5.1   Lipid Profile No results for input(s): CHOL, HDL, LDLCALC, TRIG, CHOLHDL, LDLDIRECT in the last 72 hours. Thyroid  function studies No results for input(s): TSH, T4TOTAL, T3FREE, THYROIDAB in the last 72 hours.  Invalid input(s): FREET3 Anemia work up No results for input(s): VITAMINB12, FOLATE, FERRITIN, TIBC, IRON , RETICCTPCT in the last 72 hours. Urinalysis    Component Value Date/Time   COLORURINE YELLOW 10/09/2018 1544   APPEARANCEUR CLEAR 10/09/2018 1544   LABSPEC 1.015 10/09/2018 1544   PHURINE 5.0 10/09/2018 1544   GLUCOSEU NEGATIVE 10/09/2018 1544   HGBUR NEGATIVE 10/09/2018 1544   BILIRUBINUR  NEGATIVE 10/09/2018 1544   BILIRUBINUR neg 03/25/2015 1053   KETONESUR NEGATIVE 10/09/2018 1544   PROTEINUR NEGATIVE 10/09/2018 1544   UROBILINOGEN 4.0 03/25/2015 1053   NITRITE NEGATIVE 10/09/2018 1544   LEUKOCYTESUR NEGATIVE 10/09/2018 1544   Sepsis Labs Recent Labs  Lab 12/15/23 1435 12/17/23 0445  WBC 6.1 8.2   Microbiology No results found for this or any previous visit (from the past 240 hours).   Time coordinating discharge: Over 30 minutes  SIGNED:   Camellia PARAS Andrina Locken, DO  [facility] 12/17/2023, 9:09 AM

## 2023-12-17 NOTE — TOC Progression Note (Addendum)
 Transition of Care [facility]) - Progression Note    Patient Details  Name: [name] MRN: [id number] Date of Birth: [birth year]  Transition of [facility] CM/SW Contact  Lorraine LILLETTE [place], [place] Phone Number: 12/17/2023, 9:09 AM  Clinical Narrative:    Cedar chat from MD, pt ready for DC with [facility]- choice is [facility].  CSW called [facility]- they can accept referral but admits full for the weekend, can go out Monday.  Carpio to DR advising Monday is earliest intake- MD states that should work. ICM following.   Referral completed with [facility]OP/[facility]. No further TOC needs.   Addendum: CSW spoke with spouse advised her of plan for Monday.  Completed Medical necessity and [place] to RN advising that pt will transport home and PTAR notified.     Barriers to Discharge: Continued Medical Work up               Expected Discharge Plan and Services         Expected Discharge Date: 12/17/23                                     Social Drivers of Health (SDOH) Interventions SDOH Screenings   Food Insecurity: No Food Insecurity (12/15/2023)  Housing: Low Risk  (12/15/2023)  Transportation Needs: No Transportation Needs (12/15/2023)  Utilities: Not At Risk (12/15/2023)  Alcohol Screen: Low Risk  (08/25/2023)  Depression (PHQ2-9): Low Risk  (12/01/2023)  Financial Resource Strain: Low Risk  (08/25/2023)  Physical Activity: Insufficiently Active (08/25/2023)  Social Connections: Socially Integrated (12/15/2023)  Stress: Stress Concern Present (08/25/2023)  Tobacco Use: Medium Risk (12/15/2023)  Health Literacy: Adequate Health Literacy (07/05/2023)    Readmission Risk Interventions    06/22/2022    2:44 PM  Readmission Risk Prevention Plan  Transportation Screening Complete  PCP or Specialist Appt within 3-5 Days Complete  HRI or Home Care Consult Complete  Social Work Consult for Recovery Care Planning/Counseling Complete  Palliative Care Screening Not Applicable   Medication Review [employer]) Complete

## 2023-12-17 NOTE — Consult Note (Signed)
 Consultation Note Date: 12/17/2023   Patient Name: [name]  DOB: [birth year]  MRN: [id number]  Age / Sex: 73 y.o., male  PCP: [name], MD Referring Physician: Austria, Eric J, DO  Reason for Consultation: Establishing goals of care  HPI/Patient Profile: 73 y.o. 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  Per oncology, imaging is notable for progressive disease in the right and left lung as well as bone metastasis.  They do not believe patient is benefiting from current immunotherapy and that his disease is progressing at a rapid pace; they have canceled further immunotherapy treatments.   Clinical Assessment and Goals of Care: I have reviewed medical records including EPIC notes, labs, any available advanced directives, and imaging.  SLP notes moderate aspiration risk.  Unable to receive report from primary RN.  Went to visit patient at bedside -cousin and pastor present. Patient was lying in bed awake, alert, oriented, and able to participate in conversation. No signs or non-verbal gestures of pain or discomfort noted. No respiratory distress, increased work of breathing, or secretions noted.   Therapeutic listening provided as patient reflects on his recent diagnosis and how quickly his disease has progressed.  He understands that further immunotherapy treatments have been discontinued.  He wishes to have further goals of care discussions when his wife is present -she is currently on the way to the hospital.  Will return at a later time today to meet with patient and his  wife.  9:12 AM Notified by attending that patient will be discharging home with hospice today and no further PMT needs.  Primary Decision Maker: PATIENT    SUMMARY OF RECOMMENDATIONS   Patient discharging home with hospice today Continue DNR/DNI as previously documented No further PMT needs   Code Status/Advance Care Planning: DNR  Palliative Prophylaxis:  Turn Reposition  Additional Recommendations (Limitations, Scope, Preferences): Full Comfort Care  Psycho-social/Spiritual:  Desire for further Chaplaincy support:no Created space and opportunity for patient and family to express thoughts and feelings regarding patient's current medical situation.  Emotional support and therapeutic listening provided.  Prognosis:  < 6 weeks  Discharge Planning: Home with Hospice      Primary Diagnoses: Present on Admission:  Acute respiratory failure with hypoxia (HCC)  Acute on chronic respiratory failure with hypoxia (HCC)   I have reviewed the medical record, interviewed the patient and family, and examined the patient. The following aspects are pertinent.  Past Medical History:  Diagnosis Date   BCE (basal cell epithelioma), arm    RIGHT SHOULDER   Bradycardia    Bumps on skin    on left side of nose for last 3 weeks   CAD (coronary artery disease)    a. s/p CABGx2 10/11/18 LIMA to LAD, SVG to RCA, EVH via right thigh.   Dyslipidemia    Dysrhythmia    aflutter/afib   Ectatic thoracic aorta    Esophageal stricture    GERD (gastroesophageal reflux disease)    Heart murmur    sees dr jerrald   Hyperlipidemia    Incidental pulmonary nodule, > 3mm and < 8mm 10/04/2018   Noted on CTA   Inguinal hernia    bilateral   Obesity    Presence of permanent cardiac pacemaker  Prostate cancer (HCC)    S/P aortic valve replacement with bioprosthetic valve 10/11/2018   23 mm Edwards Inspiris Resilia stented bovine pericardial tissue valve, miltral valve done also   S/P CABG  x 2 10/11/2018   LIMA to LAD, SVG to RCA, EVH via right thigh   S/P mitral valve replacement with bioprosthetic valve 10/11/2018   27 mm Medtronic Mosaic stented porcine bioprosthetic tissue valve   S/P patent foramen ovale closure 10/11/2018   S/P placement of cardiac pacemaker 2020   Smoker    Stroke [facility])    Social History   Socioeconomic History   Marital status: Married    Spouse name: Clarita   Number of children: 0   Years of education: Not on file   Highest education level: Bachelor's degree (e.g., BA, AB, BS)  Occupational History   Occupation: Market Researcher: [employer]  Tobacco Use   Smoking status: Former    Average packs/day: 0.3 packs/day for 40.6 years (14.0 ttl pk-yrs)    Types: Cigarettes    Start date: 02/08/1978    Passive exposure: Never   Smokeless tobacco: Never  Vaping Use   Vaping status: Never Used  Substance and Sexual Activity   Alcohol use: Yes    Alcohol/week: 1.0 standard drink of alcohol    Types: 1 Cans of beer per week    Comment: rare   Drug use: No   Sexual activity: Yes  Other Topics Concern   Not on file  Social History Narrative   Lives with wife at home    Social Drivers of Health   Financial Resource Strain: Low Risk  (08/25/2023)   Overall Financial Resource Strain (CARDIA)    Difficulty of Paying Living Expenses: Not hard at all  Food Insecurity: No Food Insecurity (12/15/2023)   Hunger Vital Sign    Worried About Running Out of Food in the Last Year: Never true    Ran Out of Food in the Last Year: Never true  Transportation Needs: No Transportation Needs (12/15/2023)   PRAPARE - Administrator, Civil Service (Medical): No    Lack of Transportation (Non-Medical): No  Physical Activity: Insufficiently Active (08/25/2023)   Exercise Vital Sign    Days of Exercise per Week: 1 day    Minutes of Exercise per Session: 10 min  Stress: Stress Concern Present (08/25/2023)   [employer] of Occupational Health  - Occupational Stress Questionnaire    Feeling of Stress: To some extent  Social Connections: Socially Integrated (12/15/2023)   Social Connection and Isolation Panel    Frequency of Communication with Friends and Family: More than three times a week    Frequency of Social Gatherings with Friends and Family: Once a week    Attends Religious Services: More than 4 times per year    Active Member of [employer] or Organizations: Yes    Attends Engineer, Structural: More than 4 times per year    Marital Status: Married   Family History  Problem Relation Age of Onset   Colon cancer Mother    Lung cancer Father        smoker   CAD Neg Hx    Pancreatic cancer Neg Hx    Breast cancer Neg Hx    Scheduled Meds:  atorvastatin   20 mg Oral Daily   Chlorhexidine  Gluconate Cloth  6 each Topical Q0600   digoxin   125 mcg Oral Daily   feeding supplement  237 mL Oral  BID BM   furosemide   40 mg Intravenous Q12H   insulin  aspart  0-15 Units Subcutaneous TID WC   insulin  aspart  0-5 Units Subcutaneous QHS   metoprolol  succinate  100 mg Oral Daily   mirtazapine   15 mg Oral QHS   pantoprazole   40 mg Oral Daily   Continuous Infusions:  cefTRIAXone  (ROCEPHIN )  IV 1 g (12/16/23 1808)   doxycycline (VIBRAMYCIN) IV 100 mg (12/17/23 0443)   PRN Meds:.acetaminophen  **OR** acetaminophen , albuterol , ondansetron  **OR** ondansetron  (ZOFRAN ) IV, traZODone Medications Prior to Admission:  Prior to Admission medications   Medication Sig Start Date End Date Taking? Authorizing Provider  amoxicillin  (AMOXIL ) 500 MG capsule Take 1 capsule (500 mg total) by mouth 3 (three) times daily. 08/09/23  Yes Dennise Kingsley, MD  atorvastatin  (LIPITOR) 20 MG tablet TAKE 1 TABLET BY MOUTH EVERY DAY 07/21/23  Yes [name], MD  digoxin  (LANOXIN ) 0.125 MG tablet TAKE 1 TABLET BY MOUTH EVERY DAY 03/02/23  Yes Sabharwal, Aditya, DO  ELIQUIS  5 MG TABS tablet TAKE 1 TABLET BY MOUTH 2 TIMES A DAY 08/08/23  Yes Sabharwal, Aditya,  DO  furosemide  (LASIX ) 20 MG tablet Take 1 tablet (20 mg total) by mouth daily. Patient is taking every other day. Patient taking differently: Take 20 mg by mouth every other day. 07/22/23  Yes Sabharwal, Aditya, DO  HYDROcodone -acetaminophen  (NORCO/VICODIN) 5-325 MG tablet Take 1 tablet by mouth every 6 (six) hours as needed for moderate pain (pain score 4-6). 12/01/23  Yes Pickenpack-Cousar, Fannie SAILOR, NP  JARDIANCE  25 MG TABS tablet TAKE 1 TABLET BY MOUTH EVERY MORNING BEFORE BREAKFAST Patient taking differently: Take 25 mg by mouth daily before breakfast. 11/11/23  Yes [name], MD  lidocaine -prilocaine  (EMLA ) cream Apply to affected area once Patient taking differently: Apply 1 Application topically as needed (port access). 10/11/23  Yes Sherrod Sherrod, MD  metoprolol  succinate (TOPROL -XL) 100 MG 24 hr tablet TAKE 1 TABLET BY MOUTH EVERY DAY (TAKE WITH OR IMMEDIATELY FOLLOWING A MEAL) Patient taking differently: Take 100 mg by mouth at bedtime. 08/11/23  Yes Sabharwal, Aditya, DO  ondansetron  (ZOFRAN ) 8 MG tablet TAKE 1 TABLET (8 MG TOTAL) BY MOUTH EVERY 8 (EIGHT) HOURS AS NEEDED FOR NAUSEA OR VOMITING. Patient taking differently: Take 8 mg by mouth See admin instructions. Take 8 mg by mouth in the morning & evening and an additional 8 mg once a day as needed for nausea or vomiting 12/05/23  Yes Sherrod Sherrod, MD  ondansetron  (ZOFRAN -ODT) 4 MG disintegrating tablet Take 1 tablet (4 mg total) by mouth every 8 (eight) hours as needed for nausea or vomiting. Patient taking differently: Take 4 mg by mouth every 8 (eight) hours as needed for nausea or vomiting (DISSOLVE ORALLY). 09/15/23  Yes Lalonde, John C, MD  polyethylene glycol powder (GLYCOLAX /MIRALAX ) 17 GM/SCOOP powder Take 17 g by mouth daily as needed for mild constipation. 05/26/22  Yes Akula, Vijaya, MD  prochlorperazine  (COMPAZINE ) 10 MG tablet TAKE 1 TABLET (10 MG TOTAL) BY MOUTH EVERY 6 (SIX) HOURS AS NEEDED FOR NAUSEA OR VOMITING.  12/05/23  Yes Sherrod Sherrod, MD  spironolactone  (ALDACTONE ) 25 MG tablet TAKE 1 TABLET BY MOUTH EVERY DAY 11/08/23  Yes Sabharwal, Aditya, DO  acetaminophen  (TYLENOL ) 325 MG tablet Take 2 tablets (650 mg total) by mouth every 4 (four) hours as needed for moderate pain. Patient not taking: Reported on 12/15/2023 06/22/22   Patsy Lenis, MD  mirtazapine  (REMERON ) 15 MG tablet TAKE 1 TABLET BY MOUTH AT BEDTIME  Patient not taking: Reported on 12/15/2023 11/28/23   Sherrod Sherrod, MD  omeprazole  (PRILOSEC  OTC) 20 MG tablet Take 1 tablet (20 mg total) by mouth daily. Patient not taking: Reported on 12/15/2023 03/22/23 03/21/24  Elicia Claw, MD   Allergies  Allergen Reactions   Losartan  Rash   Review of Systems  Constitutional:  Positive for activity change, appetite change and fatigue.  Respiratory:  Negative for shortness of breath.   Gastrointestinal:  Negative for nausea and vomiting.    Physical Exam Vitals and nursing note reviewed.  Constitutional:      General: He is not in acute distress.    Appearance: He is ill-appearing.  Pulmonary:     Effort: No respiratory distress.  Skin:    General: Skin is warm and dry.  Neurological:     Mental Status: He is alert and oriented to person, place, and time.  Psychiatric:        Attention and Perception: Attention normal.        Behavior: Behavior is cooperative.        Cognition and Memory: Cognition and memory normal.     Vital Signs: BP 95/61 (BP Location: Right Arm)   Pulse (!) 59   Temp 97.8 F (36.6 C) (Oral)   Resp 14   Ht 5' 7 (1.702 m)   Wt 74.8 kg   SpO2 92%   BMI 25.84 kg/m  Pain Scale: 0-10   Pain Score: 0-No pain   SpO2: SpO2: 92 % O2 Device:SpO2: 92 % O2 Flow Rate: .O2 Flow Rate (L/min): 2 L/min  IO: Intake/output summary:  Intake/Output Summary (Last 24 hours) at 12/17/2023 0754 Last data filed at 12/17/2023 0453 Gross per 24 hour  Intake 113.74 ml  Output 2600 ml  Net -2486.26 ml    LBM:  Last BM Date : 12/15/23 Baseline Weight: Weight: 74.8 kg Most recent weight: Weight: 74.8 kg     Palliative Assessment/Data:      Time In: 0750 Time Out: 0835 Time Total: 45 minutes  Signed by: Jeoffrey CHRISTELLA Sharps, NP   Please contact Palliative Medicine Team phone at [phone] for questions and concerns.  For individual provider: See Amion  *Portions of this note are a verbal dictation therefore any spelling and/or grammatical errors are due to the Dragon Medical One system interpretation.

## 2023-12-22 ENCOUNTER — Inpatient Hospital Stay

## 2023-12-22 ENCOUNTER — Inpatient Hospital Stay: Admitting: Internal Medicine

## 2023-12-22 ENCOUNTER — Inpatient Hospital Stay: Admitting: Dietician

## 2023-12-26 ENCOUNTER — Other Ambulatory Visit: Payer: Self-pay | Admitting: Physician Assistant

## 2024-01-03 ENCOUNTER — Telehealth: Payer: Self-pay

## 2024-01-03 NOTE — Transitions of Care (Post Inpatient/ED Visit) (Unsigned)
 01/03/2024    Patient ID: [name], male   DOB: 08/24/1950, 73 y.o.   MRN: [id number]  01/03/2024  Name: [name] MRN: [id number] DOB: 1950-03-19  Today's TOC FU Call Status: Today's TOC FU Call Status:: Successful TOC FU Call Completed TOC FU Call Complete Date: 01/03/24  [facility] RN made outreach to verify receipt of hospice referral and start of care. RN confirmed that patient was actively enrolled in hospice program and passed away at home on 01/14/2024 per the agency.  Hospice Agency: [facility] RN spoke with: Triage RN at [phone].  Start of Care Date: 12/17/23 End of Care Date: 01-14-24, patient passed away on this date per agency.  RN CM outreached to spouse and gave condolences from [facility].  No further interventions at this time.   Bing Edison MSN, RN RN [facility] [employer] [facility]  [facility] Office Hours M-F [phone] Direct Dial: [phone] Main Phone [phone]  Fax: [phone] [email or web address]

## 2024-01-09 ENCOUNTER — Ambulatory Visit: Admitting: Cardiology

## 2024-01-09 DEATH — deceased

## 2024-01-11 ENCOUNTER — Inpatient Hospital Stay

## 2024-01-11 ENCOUNTER — Inpatient Hospital Stay: Admitting: Physician Assistant

## 2024-01-31 ENCOUNTER — Inpatient Hospital Stay

## 2024-01-31 ENCOUNTER — Inpatient Hospital Stay: Admitting: Internal Medicine

## 2024-02-14 ENCOUNTER — Ambulatory Visit: Admitting: Internal Medicine

## 2024-05-28 ENCOUNTER — Ambulatory Visit: Payer: Self-pay | Admitting: Family Medicine
